# Patient Record
Sex: Female | Born: 1945
Health system: Southern US, Community
[De-identification: ages and names within clinical notes are randomized; demographics above are authoritative.]

## PROBLEM LIST (undated history)

## (undated) DIAGNOSIS — J189 Pneumonia, unspecified organism: Secondary | ICD-10-CM

## (undated) DIAGNOSIS — I447 Left bundle-branch block, unspecified: Secondary | ICD-10-CM

## (undated) DIAGNOSIS — I5032 Chronic diastolic (congestive) heart failure: Secondary | ICD-10-CM

## (undated) DIAGNOSIS — D649 Anemia, unspecified: Secondary | ICD-10-CM

## (undated) DIAGNOSIS — I428 Other cardiomyopathies: Secondary | ICD-10-CM

## (undated) DIAGNOSIS — K1230 Oral mucositis (ulcerative), unspecified: Secondary | ICD-10-CM

## (undated) DIAGNOSIS — Z923 Personal history of irradiation: Secondary | ICD-10-CM

## (undated) DIAGNOSIS — E039 Hypothyroidism, unspecified: Secondary | ICD-10-CM

## (undated) DIAGNOSIS — I7300 Raynaud's syndrome without gangrene: Secondary | ICD-10-CM

## (undated) DIAGNOSIS — I480 Paroxysmal atrial fibrillation: Secondary | ICD-10-CM

## (undated) DIAGNOSIS — C50919 Malignant neoplasm of unspecified site of unspecified female breast: Secondary | ICD-10-CM

## (undated) DIAGNOSIS — I10 Essential (primary) hypertension: Secondary | ICD-10-CM

## (undated) DIAGNOSIS — IMO0001 Reserved for inherently not codable concepts without codable children: Secondary | ICD-10-CM

## (undated) DIAGNOSIS — Z5189 Encounter for other specified aftercare: Secondary | ICD-10-CM

## (undated) DIAGNOSIS — M1990 Unspecified osteoarthritis, unspecified site: Secondary | ICD-10-CM

## (undated) DIAGNOSIS — I6529 Occlusion and stenosis of unspecified carotid artery: Secondary | ICD-10-CM

## (undated) DIAGNOSIS — I469 Cardiac arrest, cause unspecified: Secondary | ICD-10-CM

## (undated) DIAGNOSIS — S82002A Unspecified fracture of left patella, initial encounter for closed fracture: Secondary | ICD-10-CM

## (undated) HISTORY — DX: Malignant neoplasm of unspecified site of unspecified female breast: C50.919

## (undated) HISTORY — DX: Occlusion and stenosis of unspecified carotid artery: I65.29

## (undated) HISTORY — DX: Other cardiomyopathies: I42.8

## (undated) HISTORY — DX: Hypothyroidism, unspecified: E03.9

## (undated) HISTORY — PX: TISSUE EXPANDER PLACEMENT: SHX2530

## (undated) HISTORY — PX: TISSUE EXPANDER  REMOVAL W/ REPLACEMENT OF IMPLANT: SHX2529

## (undated) HISTORY — DX: Chronic diastolic (congestive) heart failure: I50.32

## (undated) HISTORY — DX: Left bundle-branch block, unspecified: I44.7

## (undated) HISTORY — DX: Unspecified fracture of left patella, initial encounter for closed fracture: S82.002A

## (undated) HISTORY — DX: Personal history of irradiation: Z92.3

## (undated) HISTORY — DX: Cardiac arrest, cause unspecified: I46.9

## (undated) HISTORY — DX: Essential (primary) hypertension: I10

## (undated) HISTORY — DX: Raynaud's syndrome without gangrene: I73.00

## (undated) HISTORY — PX: DILATION AND CURETTAGE OF UTERUS: SHX78

## (undated) SURGERY — Surgical Case
Anesthesia: *Unknown

---
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
## 1966-12-19 HISTORY — PX: OTHER SURGICAL HISTORY: SHX169

## 1969-12-19 HISTORY — PX: OTHER SURGICAL HISTORY: SHX169

## 1974-12-19 HISTORY — PX: OTHER SURGICAL HISTORY: SHX169

## 1986-12-19 DIAGNOSIS — Z923 Personal history of irradiation: Secondary | ICD-10-CM

## 1986-12-19 HISTORY — PX: OTHER SURGICAL HISTORY: SHX169

## 1986-12-19 HISTORY — DX: Personal history of irradiation: Z92.3

## 1988-12-19 HISTORY — PX: POLYPECTOMY: SHX149

## 1998-10-29 ENCOUNTER — Ambulatory Visit (HOSPITAL_BASED_OUTPATIENT_CLINIC_OR_DEPARTMENT_OTHER): Admission: RE | Admit: 1998-10-29 | Discharge: 1998-10-29 | Payer: Self-pay | Admitting: Orthopedic Surgery

## 1999-04-16 ENCOUNTER — Encounter: Payer: Self-pay | Admitting: Hematology and Oncology

## 1999-04-16 ENCOUNTER — Ambulatory Visit (HOSPITAL_COMMUNITY): Admission: RE | Admit: 1999-04-16 | Discharge: 1999-04-16 | Payer: Self-pay | Admitting: Hematology and Oncology

## 1999-05-11 ENCOUNTER — Other Ambulatory Visit: Admission: RE | Admit: 1999-05-11 | Discharge: 1999-05-11 | Payer: Self-pay | Admitting: Obstetrics and Gynecology

## 2000-05-16 ENCOUNTER — Other Ambulatory Visit: Admission: RE | Admit: 2000-05-16 | Discharge: 2000-05-16 | Payer: Self-pay | Admitting: Obstetrics and Gynecology

## 2001-08-10 ENCOUNTER — Other Ambulatory Visit: Admission: RE | Admit: 2001-08-10 | Discharge: 2001-08-10 | Payer: Self-pay | Admitting: Obstetrics and Gynecology

## 2001-10-01 ENCOUNTER — Encounter: Admission: RE | Admit: 2001-10-01 | Discharge: 2001-10-01 | Payer: Self-pay | Admitting: Family Medicine

## 2001-10-01 ENCOUNTER — Encounter: Payer: Self-pay | Admitting: Family Medicine

## 2001-11-28 ENCOUNTER — Other Ambulatory Visit: Admission: RE | Admit: 2001-11-28 | Discharge: 2001-11-28 | Payer: Self-pay | Admitting: Obstetrics and Gynecology

## 2002-03-05 ENCOUNTER — Ambulatory Visit (HOSPITAL_BASED_OUTPATIENT_CLINIC_OR_DEPARTMENT_OTHER): Admission: RE | Admit: 2002-03-05 | Discharge: 2002-03-05 | Payer: Self-pay | Admitting: Plastic Surgery

## 2002-03-05 ENCOUNTER — Encounter (INDEPENDENT_AMBULATORY_CARE_PROVIDER_SITE_OTHER): Payer: Self-pay | Admitting: Specialist

## 2002-04-12 ENCOUNTER — Encounter: Payer: Self-pay | Admitting: Family Medicine

## 2002-04-12 ENCOUNTER — Encounter: Admission: RE | Admit: 2002-04-12 | Discharge: 2002-04-12 | Payer: Self-pay | Admitting: Family Medicine

## 2002-10-25 ENCOUNTER — Other Ambulatory Visit: Admission: RE | Admit: 2002-10-25 | Discharge: 2002-10-25 | Payer: Self-pay | Admitting: Obstetrics and Gynecology

## 2003-12-08 ENCOUNTER — Inpatient Hospital Stay (HOSPITAL_COMMUNITY): Admission: EM | Admit: 2003-12-08 | Discharge: 2003-12-11 | Payer: Self-pay | Admitting: Emergency Medicine

## 2004-01-09 ENCOUNTER — Encounter: Admission: RE | Admit: 2004-01-09 | Discharge: 2004-01-09 | Payer: Self-pay | Admitting: Family Medicine

## 2004-01-16 ENCOUNTER — Other Ambulatory Visit: Admission: RE | Admit: 2004-01-16 | Discharge: 2004-01-16 | Payer: Self-pay | Admitting: Obstetrics and Gynecology

## 2004-03-05 ENCOUNTER — Inpatient Hospital Stay (HOSPITAL_BASED_OUTPATIENT_CLINIC_OR_DEPARTMENT_OTHER): Admission: RE | Admit: 2004-03-05 | Discharge: 2004-03-05 | Payer: Self-pay | Admitting: *Deleted

## 2004-03-31 ENCOUNTER — Ambulatory Visit (HOSPITAL_COMMUNITY): Admission: RE | Admit: 2004-03-31 | Discharge: 2004-03-31 | Payer: Self-pay | Admitting: Family Medicine

## 2005-02-18 ENCOUNTER — Other Ambulatory Visit: Admission: RE | Admit: 2005-02-18 | Discharge: 2005-02-18 | Payer: Self-pay | Admitting: Obstetrics and Gynecology

## 2005-12-05 ENCOUNTER — Ambulatory Visit: Payer: Self-pay | Admitting: Cardiovascular Disease

## 2006-01-05 ENCOUNTER — Ambulatory Visit (HOSPITAL_COMMUNITY): Admission: RE | Admit: 2006-01-05 | Discharge: 2006-01-05 | Payer: Self-pay | Admitting: Cardiovascular Disease

## 2006-01-05 ENCOUNTER — Ambulatory Visit: Payer: Self-pay | Admitting: Cardiovascular Disease

## 2006-04-04 ENCOUNTER — Other Ambulatory Visit: Admission: RE | Admit: 2006-04-04 | Discharge: 2006-04-04 | Payer: Self-pay | Admitting: Obstetrics and Gynecology

## 2007-03-22 ENCOUNTER — Ambulatory Visit: Payer: Self-pay | Admitting: Cardiovascular Disease

## 2007-03-22 LAB — CONVERTED CEMR LAB
Free T4: 1.1 ng/dL (ref 0.6–1.6)
TSH: 0.87 microintl units/mL (ref 0.35–5.50)

## 2007-03-26 ENCOUNTER — Encounter: Payer: Self-pay | Admitting: Cardiovascular Disease

## 2007-03-26 ENCOUNTER — Ambulatory Visit: Payer: Self-pay

## 2007-04-04 ENCOUNTER — Ambulatory Visit: Payer: Self-pay | Admitting: Cardiovascular Disease

## 2007-04-12 ENCOUNTER — Ambulatory Visit: Payer: Self-pay | Admitting: Cardiovascular Disease

## 2007-04-19 ENCOUNTER — Ambulatory Visit: Payer: Self-pay | Admitting: Cardiovascular Disease

## 2007-04-23 ENCOUNTER — Ambulatory Visit: Payer: Self-pay | Admitting: Internal Medicine

## 2007-05-10 ENCOUNTER — Ambulatory Visit: Payer: Self-pay | Admitting: Internal Medicine

## 2007-06-04 ENCOUNTER — Ambulatory Visit: Payer: Self-pay | Admitting: Internal Medicine

## 2007-07-02 ENCOUNTER — Ambulatory Visit: Payer: Self-pay | Admitting: Internal Medicine

## 2007-11-02 ENCOUNTER — Ambulatory Visit: Payer: Self-pay | Admitting: Internal Medicine

## 2007-12-20 DIAGNOSIS — C50919 Malignant neoplasm of unspecified site of unspecified female breast: Secondary | ICD-10-CM

## 2007-12-20 HISTORY — PX: BREAST LUMPECTOMY: SHX2

## 2007-12-20 HISTORY — DX: Malignant neoplasm of unspecified site of unspecified female breast: C50.919

## 2007-12-20 HISTORY — PX: MASTECTOMY: SHX3

## 2007-12-24 ENCOUNTER — Ambulatory Visit: Payer: Self-pay | Admitting: Internal Medicine

## 2008-01-07 ENCOUNTER — Ambulatory Visit: Payer: Self-pay | Admitting: Internal Medicine

## 2008-01-07 LAB — CONVERTED CEMR LAB
BUN: 15 mg/dL (ref 6–23)
Calcium: 9.4 mg/dL (ref 8.4–10.5)
Chloride: 101 meq/L (ref 96–112)
GFR calc non Af Amer: 90 mL/min
Glucose, Bld: 79 mg/dL (ref 70–99)

## 2008-01-22 ENCOUNTER — Encounter: Admission: RE | Admit: 2008-01-22 | Discharge: 2008-01-22 | Payer: Self-pay | Admitting: Obstetrics and Gynecology

## 2008-01-22 ENCOUNTER — Encounter (INDEPENDENT_AMBULATORY_CARE_PROVIDER_SITE_OTHER): Payer: Self-pay | Admitting: Diagnostic Radiology

## 2008-01-30 ENCOUNTER — Ambulatory Visit: Admission: RE | Admit: 2008-01-30 | Discharge: 2008-04-29 | Payer: Self-pay | Admitting: Radiation Oncology

## 2008-01-31 ENCOUNTER — Encounter: Admission: RE | Admit: 2008-01-31 | Discharge: 2008-01-31 | Payer: Self-pay | Admitting: Obstetrics and Gynecology

## 2008-02-04 ENCOUNTER — Ambulatory Visit: Payer: Self-pay | Admitting: Internal Medicine

## 2008-03-05 ENCOUNTER — Encounter: Admission: RE | Admit: 2008-03-05 | Discharge: 2008-03-05 | Payer: Self-pay | Admitting: General Surgery

## 2008-03-05 ENCOUNTER — Encounter (INDEPENDENT_AMBULATORY_CARE_PROVIDER_SITE_OTHER): Payer: Self-pay | Admitting: General Surgery

## 2008-03-05 ENCOUNTER — Ambulatory Visit (HOSPITAL_COMMUNITY): Admission: RE | Admit: 2008-03-05 | Discharge: 2008-03-06 | Payer: Self-pay | Admitting: General Surgery

## 2008-03-10 ENCOUNTER — Ambulatory Visit: Payer: Self-pay | Admitting: Oncology

## 2008-03-19 LAB — CBC WITH DIFFERENTIAL/PLATELET
BASO%: 0.3 % (ref 0.0–2.0)
Basophils Absolute: 0 10*3/uL (ref 0.0–0.1)
EOS%: 3.5 % (ref 0.0–7.0)
HGB: 12.7 g/dL (ref 11.6–15.9)
MCH: 31.6 pg (ref 26.0–34.0)
RBC: 4.01 10*6/uL (ref 3.70–5.32)
RDW: 13.9 % (ref 11.3–14.5)
lymph#: 1.5 10*3/uL (ref 0.9–3.3)

## 2008-03-19 LAB — COMPREHENSIVE METABOLIC PANEL
ALT: 20 U/L (ref 0–35)
AST: 20 U/L (ref 0–37)
Albumin: 4.2 g/dL (ref 3.5–5.2)
Alkaline Phosphatase: 90 U/L (ref 39–117)
BUN: 22 mg/dL (ref 6–23)
Calcium: 9.6 mg/dL (ref 8.4–10.5)
Chloride: 102 mEq/L (ref 96–112)
Potassium: 4.7 mEq/L (ref 3.5–5.3)
Sodium: 140 mEq/L (ref 135–145)
Total Protein: 6.2 g/dL (ref 6.0–8.3)

## 2008-03-28 ENCOUNTER — Ambulatory Visit (HOSPITAL_COMMUNITY): Admission: RE | Admit: 2008-03-28 | Discharge: 2008-03-28 | Payer: Self-pay | Admitting: Oncology

## 2008-04-18 ENCOUNTER — Ambulatory Visit: Payer: Self-pay | Admitting: Cardiovascular Disease

## 2008-04-22 ENCOUNTER — Ambulatory Visit: Payer: Self-pay

## 2008-04-22 ENCOUNTER — Encounter: Payer: Self-pay | Admitting: Cardiovascular Disease

## 2008-04-29 ENCOUNTER — Inpatient Hospital Stay (HOSPITAL_COMMUNITY): Admission: RE | Admit: 2008-04-29 | Discharge: 2008-04-30 | Payer: Self-pay | Admitting: General Surgery

## 2008-04-29 ENCOUNTER — Encounter (INDEPENDENT_AMBULATORY_CARE_PROVIDER_SITE_OTHER): Payer: Self-pay | Admitting: General Surgery

## 2008-08-04 ENCOUNTER — Ambulatory Visit: Payer: Self-pay | Admitting: Cardiovascular Disease

## 2008-08-15 ENCOUNTER — Ambulatory Visit: Payer: Self-pay | Admitting: Oncology

## 2008-08-18 LAB — CBC WITH DIFFERENTIAL/PLATELET
BASO%: 2.2 % — ABNORMAL HIGH (ref 0.0–2.0)
EOS%: 4.4 % (ref 0.0–7.0)
LYMPH%: 20.6 % (ref 14.0–48.0)
MCH: 31.3 pg (ref 26.0–34.0)
MCHC: 33.5 g/dL (ref 32.0–36.0)
MCV: 93.6 fL (ref 81.0–101.0)
MONO%: 7.4 % (ref 0.0–13.0)
Platelets: 305 10*3/uL (ref 145–400)
RBC: 4.11 10*6/uL (ref 3.70–5.32)

## 2008-08-19 LAB — COMPREHENSIVE METABOLIC PANEL
ALT: 13 U/L (ref 0–35)
AST: 17 U/L (ref 0–37)
Alkaline Phosphatase: 100 U/L (ref 39–117)
Glucose, Bld: 103 mg/dL — ABNORMAL HIGH (ref 70–99)
Sodium: 142 mEq/L (ref 135–145)
Total Bilirubin: 0.3 mg/dL (ref 0.3–1.2)
Total Protein: 6.6 g/dL (ref 6.0–8.3)

## 2008-08-19 LAB — LACTATE DEHYDROGENASE: LDH: 152 U/L (ref 94–250)

## 2009-01-20 ENCOUNTER — Ambulatory Visit (HOSPITAL_COMMUNITY): Admission: RE | Admit: 2009-01-20 | Discharge: 2009-01-20 | Payer: Self-pay | Admitting: Plastic Surgery

## 2009-02-06 ENCOUNTER — Ambulatory Visit: Payer: Self-pay | Admitting: Cardiovascular Disease

## 2009-02-11 ENCOUNTER — Ambulatory Visit: Payer: Self-pay

## 2009-02-13 ENCOUNTER — Ambulatory Visit: Payer: Self-pay | Admitting: Oncology

## 2009-02-17 ENCOUNTER — Ambulatory Visit: Payer: Self-pay | Admitting: Cardiovascular Disease

## 2009-02-19 ENCOUNTER — Ambulatory Visit (HOSPITAL_COMMUNITY): Admission: RE | Admit: 2009-02-19 | Discharge: 2009-02-20 | Payer: Self-pay | Admitting: Plastic Surgery

## 2009-05-02 DIAGNOSIS — I447 Left bundle-branch block, unspecified: Secondary | ICD-10-CM | POA: Insufficient documentation

## 2009-05-02 DIAGNOSIS — E039 Hypothyroidism, unspecified: Secondary | ICD-10-CM | POA: Insufficient documentation

## 2009-05-02 DIAGNOSIS — I10 Essential (primary) hypertension: Secondary | ICD-10-CM

## 2009-05-02 DIAGNOSIS — I429 Cardiomyopathy, unspecified: Secondary | ICD-10-CM | POA: Insufficient documentation

## 2009-05-02 DIAGNOSIS — D0590 Unspecified type of carcinoma in situ of unspecified breast: Secondary | ICD-10-CM | POA: Insufficient documentation

## 2009-05-26 ENCOUNTER — Telehealth: Payer: Self-pay | Admitting: Cardiovascular Disease

## 2009-09-07 ENCOUNTER — Ambulatory Visit: Payer: Self-pay | Admitting: Cardiovascular Disease

## 2010-01-07 ENCOUNTER — Ambulatory Visit (HOSPITAL_COMMUNITY): Admission: RE | Admit: 2010-01-07 | Discharge: 2010-01-07 | Payer: Self-pay | Admitting: Oncology

## 2010-02-11 ENCOUNTER — Ambulatory Visit: Payer: Self-pay | Admitting: Oncology

## 2010-02-16 LAB — CANCER ANTIGEN 27.29: CA 27.29: 25 U/mL (ref 0–39)

## 2010-02-16 LAB — CBC WITH DIFFERENTIAL/PLATELET
Basophils Absolute: 0.2 10*3/uL — ABNORMAL HIGH (ref 0.0–0.1)
Eosinophils Absolute: 0.4 10*3/uL (ref 0.0–0.5)
HCT: 38.5 % (ref 34.8–46.6)
HGB: 12.8 g/dL (ref 11.6–15.9)
MONO#: 0.9 10*3/uL (ref 0.1–0.9)
NEUT#: 4.5 10*3/uL (ref 1.5–6.5)
NEUT%: 54 % (ref 38.4–76.8)
WBC: 8.3 10*3/uL (ref 3.9–10.3)
lymph#: 2.4 10*3/uL (ref 0.9–3.3)

## 2010-02-16 LAB — VITAMIN D 25 HYDROXY (VIT D DEFICIENCY, FRACTURES): Vit D, 25-Hydroxy: 55 ng/mL (ref 30–89)

## 2010-02-16 LAB — COMPREHENSIVE METABOLIC PANEL
Albumin: 4 g/dL (ref 3.5–5.2)
BUN: 21 mg/dL (ref 6–23)
Calcium: 9.2 mg/dL (ref 8.4–10.5)
Chloride: 99 mEq/L (ref 96–112)
Glucose, Bld: 119 mg/dL — ABNORMAL HIGH (ref 70–99)
Potassium: 3.9 mEq/L (ref 3.5–5.3)
Sodium: 138 mEq/L (ref 135–145)
Total Protein: 6.7 g/dL (ref 6.0–8.3)

## 2010-02-23 ENCOUNTER — Encounter: Payer: Self-pay | Admitting: Cardiovascular Disease

## 2010-09-21 ENCOUNTER — Telehealth: Payer: Self-pay | Admitting: Cardiovascular Disease

## 2010-10-26 ENCOUNTER — Ambulatory Visit: Payer: Self-pay | Admitting: Cardiovascular Disease

## 2010-10-26 ENCOUNTER — Encounter: Payer: Self-pay | Admitting: Cardiovascular Disease

## 2010-11-23 ENCOUNTER — Ambulatory Visit: Payer: Self-pay | Admitting: Cardiovascular Disease

## 2011-01-09 ENCOUNTER — Encounter: Payer: Self-pay | Admitting: Cardiovascular Disease

## 2011-01-11 ENCOUNTER — Other Ambulatory Visit: Payer: Self-pay | Admitting: Obstetrics and Gynecology

## 2011-01-18 NOTE — Letter (Signed)
[facility]   Imported By: Roderic Ovens 04/13/2010 10:52:07  _____________________________________________________________________  External Attachment:    Type:   Image     Comment:   External Document

## 2011-01-18 NOTE — Assessment & Plan Note (Signed)
Summary: PER CHECK OUT/SF   CC:  check up.  History of Present Illness: [name] is seen today for F/U of non-ischemic DCM EF around 50%, abnormal ECG with ICLBBB and HTN. When I last saw her I cleared her for Breast Reconstructive Surgery which went well without cardiac complications.  She had a normal myovue 02/11/2009 with EF improved to 74%  By echo on 04/22/2008 wther was mild LVE with EF 50-55%.  She's had a previous cath I believe in 2005 with no CAD.  She is not having any SOB, palpitations, PND, orthopnea or edema.  She is walking 2 miles/day.  She is in excellent spirits.  Her husband  finally got his kidney transplant with some minor complications.  She hates the drive to [place].  We stopped her Nadolol last time and continued just coreg and she is doing fine with this  Current Problems (verified): 1)  Atrial Fibrillation  (ICD-427.31) 2)  Lbbb  (ICD-426.3) 3)  Cardiomyopathy, Secondary  (ICD-425.9) 4)  Hypertension, Unspecified  (ICD-401.9) 5)  Hypothyroidism  (ICD-244.9) 6)  Carcinoma in Situ of Breast  (ICD-233.0)  Current Medications (verified): 1)  Carvedilol 12.5 Mg Tabs (Carvedilol) .Marland Kitchen.. 1 1/2 Tab By Mouth Two Times A Day 2)  Cozaar 50 Mg Tabs (Losartan Potassium) .... Take One Tablet By Mouth Daily 3)  Levothyroxine Sodium 112 Mcg Tabs (Levothyroxine Sodium) .Marland Kitchen.. 1 Tab By Mouth Once Daily 4)  Mag-Ox 400 400 Mg Tabs (Magnesium Oxide) .... Take 1 Tablet By Mouth Two Times A Day 5)  Calcium-Magnesium 300-300 Mg Tabs (Calcium-Magnesium) .... 2 Tabs By Mouth Two Times A Day 6)  Osteo Complex  Caps (Multiple Vitamins-Minerals) .Marland Kitchen.. 1 Tab By Mouth Two Times A Day 7)  Multivitamins   Tabs (Multiple Vitamin) .... 2  A Day 8)  Vegetarian Omega Green .Marland Kitchen.. 3 Tabs Once Daily 9)  Ciaga Juice .Marland Kitchen.. 1 Oz Once Daily. As Needed 10)  Vitamin D3 5000 Unit Caps (Cholecalciferol) .... Take 1 Capsule By Mouth Once A Day  Allergies (verified): 1)  Epinephrine (Epinephrine) 2)  Lisinopril  (Lisinopril)  Past History:  Past Medical History: Last updated: 05/02/2009 ATRIAL FIBRILLATION (ICD-427.31) LBBB (ICD-426.3) CARDIOMYOPATHY, SECONDARY (ICD-425.9) HYPERTENSION, UNSPECIFIED (ICD-401.9) HYPOTHYROIDISM (ICD-244.9) CARCINOMA IN SITU OF BREAST (ICD-233.0)    Past Surgical History: Last updated: 05/02/2009 T&A - 1965 Thoractomy -  1968 BTL - 1971 R vein ligation & stripping - 1976 Spleenectomy for Hodgkin's disease - 1988 D & C 1791 & 1977 D&C-polypectomy - 1990 R breast lumpectomy, lymph node biopsy - 2009 Mastectomy - 2009  Family History: Last updated: 09/07/2009 non-contributory  Social History: Last updated: 05/02/2009 Retired  Married  Tobacco Use - No.  Alcohol Use - no Regular Exercise - yes Drug Use - no  Review of Systems       Denies fever, malais, weight loss, blurry vision, decreased visual acuity, cough, sputum, SOB, hemoptysis, pleuritic pain, palpitaitons, heartburn, abdominal pain, melena, lower extremity edema, claudication, or rash.   Vital Signs:  Patient profile:   65 year old female Height:      62 inches Weight:      139 pounds BMI:     25.52 Pulse rate:   87 / minute Resp:     16 per minute BP sitting:   93 / 55  (left arm)  Vitals Entered By: Kem Parkinson (November 23, 2010 2:02 PM)  Physical Exam  General:  Affect appropriate Healthy:  appears stated age HEENT: normal Neck supple with  no adenopathy JVP normal no bruits no thyromegaly Lungs clear with no wheezing and good diaphragmatic motion Heart:  S1/S2 no murmur,rub, gallop or click PMI normal Abdomen: benighn, BS positve, no tenderness, no AAA no bruit.  No HSM or HJR Distal pulses intact with no bruits No edema Neuro non-focal Skin warm and dry    Impression & Recommendations:  Problem # 1:  CARDIOMYOPATHY, SECONDARY (ICD-425.9) Stable improved EF on medical Rx and functional class one Her updated medication list for this problem includes:     Carvedilol 12.5 Mg Tabs (Carvedilol) .Marland Kitchen... 1 1/2 tab by mouth two times a day    Cozaar 50 Mg Tabs (Losartan potassium) .Marland Kitchen... Take one tablet by mouth daily  Problem # 2:  HYPERTENSION, UNSPECIFIED (ICD-401.9) Well controlled Her updated medication list for this problem includes:    Carvedilol 12.5 Mg Tabs (Carvedilol) .Marland Kitchen... 1 1/2 tab by mouth two times a day    Cozaar 50 Mg Tabs (Losartan potassium) .Marland Kitchen... Take one tablet by mouth daily  Problem # 3:  CARCINOMA IN SITU OF BREAST (ICD-233.0) S/P reconstructive surgery with no evidence of recurrence  F/U oncology  Problem # 4:  LBBB (ICD-426.3) No evidence of high grade block or syncope.  Probably reflective of DCM Her updated medication list for this problem includes:    Carvedilol 12.5 Mg Tabs (Carvedilol) .Marland Kitchen... 1 1/2 tab by mouth two times a day  Patient Instructions: 1)  Your physician wants you to follow-up in: 6 MONTHS  You will receive a reminder letter in the mail two months in advance. If you don't receive a letter, please call our office to schedule the follow-up appointment.

## 2011-01-18 NOTE — Progress Notes (Signed)
Summary: refill  Phone Note Refill Request Message from:  Patient on September 21, 2010 10:18 AM  Refills Requested: Medication #1:  CARVEDILOL 12.5 MG TABS 1 1/2 tab by mouth two times a day [facility] to [facility]  [phone]  Initial call taken by: Judie Grieve,  September 21, 2010 10:18 AM    Prescriptions: CARVEDILOL 12.5 MG TABS (CARVEDILOL) 1 1/2 tab by mouth two times a day  #90 Tablet x 3   Entered by:   Kem Parkinson   Authorized by:   Colon Branch, MD, [facility]   Signed by:   Kem Parkinson on 09/21/2010   Method used:   Electronically to        [facility] # [phone]* (retail)       [address] [place], [place]  [address]       Ph: [phone]       Fax: [phone]   RxID:   [id number]

## 2011-01-18 NOTE — Assessment & Plan Note (Signed)
Summary: f1y   Visit Type:  1 year follow up  CC:  No cardiac complaints.  History of Present Illness: [name] is seen today for F/U of non-ischemic DCM EF around 50%, abnormal ECG with ICLBBB and HTN. When I last saw her I cleared her for Breast Reconstructive Surgery which went well without cardiac complications.  She had a normal myovue 02/11/2009 with EF improved to 74%  By echo on 04/22/2008 wther was mild LVE with EF 50-55%.  She's had a previous cath I believe in 2005 with no CAD.  She is not having any SOB, palpitations, PND, orthopnea or edema.  She is walking 2 miles/day.  She is in excellent spirits.  Her husband  finally got his kidney transplant with some minor complications.  She hates the drive to [place].  We discussed the issue of her being on both Nadolol and coreg.  Apparantly this was started by Dr Hedwig Morton.  I dont think she needs to be on both and will stop the Nadolol  Problems Prior to Update: 1)  Atrial Fibrillation  (ICD-427.31) 2)  Lbbb  (ICD-426.3) 3)  Cardiomyopathy, Secondary  (ICD-425.9) 4)  Hypertension, Unspecified  (ICD-401.9) 5)  Hypothyroidism  (ICD-244.9) 6)  Carcinoma in Situ of Breast  (ICD-233.0)  Current Problems (verified): 1)  Atrial Fibrillation  (ICD-427.31) 2)  Lbbb  (ICD-426.3) 3)  Cardiomyopathy, Secondary  (ICD-425.9) 4)  Hypertension, Unspecified  (ICD-401.9) 5)  Hypothyroidism  (ICD-244.9) 6)  Carcinoma in Situ of Breast  (ICD-233.0)  Current Medications (verified): 1)  Carvedilol 12.5 Mg Tabs (Carvedilol) .Marland Kitchen.. 1 1/2 Tab By Mouth Two Times A Day 2)  Nadolol 40 Mg Tabs (Nadolol) .... Take 1 Tablet By Mouth Once A Day 3)  Cozaar 50 Mg Tabs (Losartan Potassium) .... Take One Tablet By Mouth Daily 4)  Levothyroxine Sodium 112 Mcg Tabs (Levothyroxine Sodium) .Marland Kitchen.. 1 Tab By Mouth Once Daily 5)  Mag-Ox 400 400 Mg Tabs (Magnesium Oxide) .... Take 1 Tablet By Mouth Two Times A Day 6)  Calcium-Magnesium 300-300 Mg Tabs (Calcium-Magnesium) .... 2  Tabs By Mouth Two Times A Day 7)  Osteo Complex  Caps (Multiple Vitamins-Minerals) .Marland Kitchen.. 1 Tab By Mouth Two Times A Day 8)  Multivitamins   Tabs (Multiple Vitamin) .... 3 A Day 9)  Vegetarian Omega Green .Marland Kitchen.. 3 Tabs Once Daily 10)  Ciaga Juice .Marland Kitchen.. 1 Oz Once Daily. As Needed 11)  Vitamin D3 5000 Unit Caps (Cholecalciferol) .... Take 1 Capsule By Mouth Once A Day  Allergies: 1)  Epinephrine (Epinephrine) 2)  Lisinopril (Lisinopril)  Past History:  Past Medical History: Last updated: 05/02/2009 ATRIAL FIBRILLATION (ICD-427.31) LBBB (ICD-426.3) CARDIOMYOPATHY, SECONDARY (ICD-425.9) HYPERTENSION, UNSPECIFIED (ICD-401.9) HYPOTHYROIDISM (ICD-244.9) CARCINOMA IN SITU OF BREAST (ICD-233.0)    Past Surgical History: Last updated: 05/02/2009 T&A - 1965 Thoractomy -  1968 BTL - 1971 R vein ligation & stripping - 1976 Spleenectomy for Hodgkin's disease - 1988 D & C 1791 & 1977 D&C-polypectomy - 1990 R breast lumpectomy, lymph node biopsy - 2009 Mastectomy - 2009  Family History: Last updated: 09/07/2009 non-contributory  Social History: Last updated: 05/02/2009 Retired  Married  Tobacco Use - No.  Alcohol Use - no Regular Exercise - yes Drug Use - no  Review of Systems       Denies fever, malais, weight loss, blurry vision, decreased visual acuity, cough, sputum, SOB, hemoptysis, pleuritic pain, palpitaitons, heartburn, abdominal pain, melena, lower extremity edema, claudication, or rash.   Vital Signs:  Patient profile:  65 year old female Height:      62 inches Weight:      137.50 pounds BMI:     25.24 Pulse rate:   70 / minute Pulse rhythm:   regular Resp:     18 per minute BP sitting:   105 / 62  (left arm) Cuff size:   regular  Vitals Entered By: Vikki Ports (October 26, 2010 9:07 AM)  Physical Exam  General:  Affect appropriate Healthy:  appears stated age HEENT: normal Neck supple with no adenopathy JVP normal no bruits no thyromegaly Lungs  clear with no wheezing and good diaphragmatic motion Heart:  S1/S2 no murmur,rub, gallop or click PMI normal Abdomen: benighn, BS positve, no tenderness, no AAA no bruit.  No HSM or HJR Distal pulses intact with no bruits No edema Neuro non-focal Skin warm and dry    Impression & Recommendations:  Problem # 1:  LBBB (ICD-426.3) Stable no high grade heart block The following medications were removed from the medication list:    Nadolol 40 Mg Tabs (Nadolol) .Marland Kitchen... Take 1 tablet by mouth once a day Her updated medication list for this problem includes:    Carvedilol 12.5 Mg Tabs (Carvedilol) .Marland Kitchen... 1 1/2 tab by mouth two times a day  Problem # 2:  CARDIOMYOPATHY, SECONDARY (ICD-425.9) Stable functional class one.  Will reassess EF in a year with echo or MRI The following medications were removed from the medication list:    Nadolol 40 Mg Tabs (Nadolol) .Marland Kitchen... Take 1 tablet by mouth once a day Her updated medication list for this problem includes:    Carvedilol 12.5 Mg Tabs (Carvedilol) .Marland Kitchen... 1 1/2 tab by mouth two times a day    Cozaar 50 Mg Tabs (Losartan potassium) .Marland Kitchen... Take one tablet by mouth daily  Problem # 3:  HYPERTENSION, UNSPECIFIED (ICD-401.9) Well controlled.  Stop nadolol and adjust coreg as needed The following medications were removed from the medication list:    Nadolol 40 Mg Tabs (Nadolol) .Marland Kitchen... Take 1 tablet by mouth once a day Her updated medication list for this problem includes:    Carvedilol 12.5 Mg Tabs (Carvedilol) .Marland Kitchen... 1 1/2 tab by mouth two times a day    Cozaar 50 Mg Tabs (Losartan potassium) .Marland Kitchen... Take one tablet by mouth daily  Other Orders: EKG w/ Interpretation (93000)  Patient Instructions: 1)  Your physician recommends that you schedule a follow-up appointment in: 4 WEEKS WITH DR Eden Emms 2)  Your physician has recommended you make the following change in your medication: STOP NADOLOL   EKG Report  Procedure date:  10/26/2010  Findings:       NSR 68 LBBB

## 2011-01-20 NOTE — Letter (Signed)
Summary: [name] Physicians Office Visit Note   [facility] Physicians Office Visit Note   Imported By: Roderic Ovens 12/01/2010 11:22:11  _____________________________________________________________________  External Attachment:    Type:   Image     Comment:   External Document

## 2011-02-11 ENCOUNTER — Emergency Department (HOSPITAL_COMMUNITY): Payer: Medicare Other

## 2011-02-11 ENCOUNTER — Emergency Department (HOSPITAL_COMMUNITY)
Admission: EM | Admit: 2011-02-11 | Discharge: 2011-02-11 | Disposition: A | Discharge status: Home / Self Care | Payer: Medicare Other | Attending: Emergency Medicine | Admitting: Emergency Medicine

## 2011-02-11 DIAGNOSIS — R079 Chest pain, unspecified: Secondary | ICD-10-CM | POA: Insufficient documentation

## 2011-02-11 DIAGNOSIS — Z853 Personal history of malignant neoplasm of breast: Secondary | ICD-10-CM | POA: Insufficient documentation

## 2011-02-11 DIAGNOSIS — I2510 Atherosclerotic heart disease of native coronary artery without angina pectoris: Secondary | ICD-10-CM | POA: Insufficient documentation

## 2011-02-11 DIAGNOSIS — I10 Essential (primary) hypertension: Secondary | ICD-10-CM | POA: Insufficient documentation

## 2011-02-11 DIAGNOSIS — Z87898 Personal history of other specified conditions: Secondary | ICD-10-CM | POA: Insufficient documentation

## 2011-02-11 DIAGNOSIS — Z79899 Other long term (current) drug therapy: Secondary | ICD-10-CM | POA: Insufficient documentation

## 2011-02-11 LAB — DIFFERENTIAL
Eosinophils Absolute: 0.4 10*3/uL (ref 0.0–0.7)
Lymphocytes Relative: 24 % (ref 12–46)
Lymphs Abs: 2.2 10*3/uL (ref 0.7–4.0)
Monocytes Relative: 12 % (ref 3–12)
Neutrophils Relative %: 58 % (ref 43–77)

## 2011-02-11 LAB — POCT I-STAT, CHEM 8
BUN: 24 mg/dL — ABNORMAL HIGH (ref 6–23)
Calcium, Ion: 1.16 mmol/L (ref 1.12–1.32)
Glucose, Bld: 111 mg/dL — ABNORMAL HIGH (ref 70–99)
HCT: 37 % (ref 36.0–46.0)
TCO2: 29 mmol/L (ref 0–100)

## 2011-02-11 LAB — CBC
HCT: 34.6 % — ABNORMAL LOW (ref 36.0–46.0)
MCH: 30.1 pg (ref 26.0–34.0)
MCV: 89.6 fL (ref 78.0–100.0)
Platelets: 373 10*3/uL (ref 150–400)
RBC: 3.86 MIL/uL — ABNORMAL LOW (ref 3.87–5.11)
WBC: 8.9 10*3/uL (ref 4.0–10.5)

## 2011-02-11 LAB — POCT CARDIAC MARKERS
Myoglobin, poc: 56.8 ng/mL (ref 12–200)
Troponin i, poc: <0.05 ng/mL (ref 0.00–0.09)

## 2011-02-14 ENCOUNTER — Telehealth: Payer: Self-pay | Admitting: Cardiovascular Disease

## 2011-02-18 ENCOUNTER — Other Ambulatory Visit: Payer: Self-pay | Admitting: Oncology

## 2011-02-18 ENCOUNTER — Encounter (HOSPITAL_BASED_OUTPATIENT_CLINIC_OR_DEPARTMENT_OTHER): Payer: Medicare Other | Admitting: Oncology

## 2011-02-18 DIAGNOSIS — C50519 Malignant neoplasm of lower-outer quadrant of unspecified female breast: Secondary | ICD-10-CM

## 2011-02-18 DIAGNOSIS — Z171 Estrogen receptor negative status [ER-]: Secondary | ICD-10-CM

## 2011-02-18 LAB — COMPREHENSIVE METABOLIC PANEL
Alkaline Phosphatase: 114 U/L (ref 39–117)
BUN: 16 mg/dL (ref 6–23)
CO2: 32 mEq/L (ref 19–32)
Creatinine, Ser: 0.8 mg/dL (ref 0.40–1.20)
Glucose, Bld: 85 mg/dL (ref 70–99)
Sodium: 145 mEq/L (ref 135–145)
Total Bilirubin: 0.6 mg/dL (ref 0.3–1.2)
Total Protein: 6.7 g/dL (ref 6.0–8.3)

## 2011-02-18 LAB — CBC WITH DIFFERENTIAL/PLATELET
Eosinophils Absolute: 0.4 10*3/uL (ref 0.0–0.5)
HCT: 38 % (ref 34.8–46.6)
LYMPH%: 22.4 % (ref 14.0–49.7)
MCV: 91.3 fL (ref 79.5–101.0)
MONO#: 1.1 10*3/uL — ABNORMAL HIGH (ref 0.1–0.9)
MONO%: 12.9 % (ref 0.0–14.0)
NEUT#: 4.7 10*3/uL (ref 1.5–6.5)
NEUT%: 57.9 % (ref 38.4–76.8)
Platelets: 410 10*3/uL — ABNORMAL HIGH (ref 145–400)
RBC: 4.16 10*6/uL (ref 3.70–5.45)
WBC: 8.2 10*3/uL (ref 3.9–10.3)

## 2011-02-18 LAB — LACTATE DEHYDROGENASE: LDH: 136 U/L (ref 94–250)

## 2011-02-19 LAB — CANCER ANTIGEN 27.29: CA 27.29: 33 U/mL (ref 0–39)

## 2011-02-19 LAB — VITAMIN D 25 HYDROXY (VIT D DEFICIENCY, FRACTURES): Vit D, 25-Hydroxy: 84 ng/mL (ref 30–89)

## 2011-02-24 NOTE — Progress Notes (Signed)
Summary: want to inform Dr.Nishan she in Ed last Friday  Phone Note Call from Patient Call back at Home Phone [phone]   Caller: Mom Summary of Call: Pt callind to inform Dr.Nishan know she was in the ED Friday evening ,wish for a call back was cleared and sent home Initial call taken by: Judie Grieve,  February 14, 2011 2:36 PM  Follow-up for Phone Call        Left message to call back Deliah Goody, RN  February 14, 2011 5:42 PM  spoke with pt, she had a several episodes of a sharpe, sudden pain to the left of her sternum. it would last only a few seconds but would cont to reoccur. she became scared and called ems. all of her workup was normal in the er. she has had no further episodes, was told by the er to report to [place]. pt will call with any problems.  Deliah Goody, RN  February 15, 2011 11:36 AM

## 2011-02-25 ENCOUNTER — Encounter (HOSPITAL_BASED_OUTPATIENT_CLINIC_OR_DEPARTMENT_OTHER): Payer: Medicare Other | Admitting: Oncology

## 2011-02-25 DIAGNOSIS — Z171 Estrogen receptor negative status [ER-]: Secondary | ICD-10-CM

## 2011-02-25 DIAGNOSIS — C50519 Malignant neoplasm of lower-outer quadrant of unspecified female breast: Secondary | ICD-10-CM

## 2011-03-07 LAB — CREATININE, SERUM
Creatinine, Ser: 0.89 mg/dL (ref 0.4–1.2)
GFR calc Af Amer: >60 mL/min (ref >60)
GFR calc non Af Amer: >60 mL/min (ref >60)

## 2011-03-31 LAB — BASIC METABOLIC PANEL
BUN: 19 mg/dL (ref 6–23)
Creatinine, Ser: 0.69 mg/dL (ref 0.4–1.2)
GFR calc non Af Amer: >60 mL/min (ref >60)
Glucose, Bld: 98 mg/dL (ref 70–99)
Potassium: 5.1 mEq/L (ref 3.5–5.1)

## 2011-03-31 LAB — CBC
HCT: 39.3 % (ref 36.0–46.0)
Platelets: 306 10*3/uL (ref 150–400)
RDW: 13.5 % (ref 11.5–15.5)
WBC: 7.9 10*3/uL (ref 4.0–10.5)

## 2011-04-05 LAB — BASIC METABOLIC PANEL
BUN: 17 mg/dL (ref 6–23)
Chloride: 103 mEq/L (ref 96–112)
Creatinine, Ser: 0.79 mg/dL (ref 0.4–1.2)
GFR calc Af Amer: >60 mL/min (ref >60)
GFR calc non Af Amer: >60 mL/min (ref >60)
Potassium: 4.6 mEq/L (ref 3.5–5.1)

## 2011-04-05 LAB — CBC
HCT: 41.2 % (ref 36.0–46.0)
Platelets: 301 10*3/uL (ref 150–400)
RBC: 4.34 MIL/uL (ref 3.87–5.11)
WBC: 9 10*3/uL (ref 4.0–10.5)

## 2011-04-05 LAB — URINALYSIS, ROUTINE W REFLEX MICROSCOPIC
Glucose, UA: NEGATIVE mg/dL
Nitrite: NEGATIVE
Specific Gravity, Urine: 1.009 (ref 1.005–1.030)
pH: 7.5 (ref 5.0–8.0)

## 2011-04-05 LAB — URINE MICROSCOPIC-ADD ON

## 2011-05-03 NOTE — Op Note (Signed)
[name], Patricia               ACCOUNT NO.:  [id number]   MEDICAL RECORD NO.:  [id number]          PATIENT TYPE:  INP   LOCATION:  5120                         FACILITY:  [facility]   PHYSICIAN:  Etter Sjogren, M.D.     DATE OF BIRTH:  [birth year]   DATE OF PROCEDURE:  02/19/2009  DATE OF DISCHARGE:                               OPERATIVE REPORT   PREOPERATIVE DIAGNOSIS:  Right breast cancer.   POSTOPERATIVE DIAGNOSIS:  Right breast cancer.   PROCEDURE PERFORMED:  Removed tissue expander and placed saline implant,  right chest.   SURGEON:  Etter Sjogren, MD   ANESTHESIA:  General.   ESTIMATED BLOOD LOSS:  Minimal.   DRAINS:  One Blake.   CLINICAL NOTE:  A 65 year old woman has breast cancer, has had  reconstruction with the tissue expander and now presents for removal of  expander and placement of implant.  Procedure was discussed at great  length.  She understands the risks and possible complications and wishes  to proceed.   DESCRIPTION OF PROCEDURE:  The patient was placed in a full upright  position in the holding area and marked for the removal of expander and  placement of implant with respect to the inframammary fold on each side.  She was then taken to the operating room and placed supine.  After  successful general anesthesia, she was prepped with Betadine and draped  in sterile drapes.  A 4-cm incision was made in the mid aspect of the  previous mastectomy scar.  Dissection was carried down through the  subcutaneous tissue to the underlying muscle.  The muscle splitting  incision was utilized and the tissue expander was identified and was  gently mobilized and removed after deflating and removing the saline  from it.  Meticulous hemostasis with the electrocautery.  Thorough  irrigation with saline after an inferior capsulotomy and then performed  for about a centimeter.  Excellent hemostasis was confirmed.  A Blake  drain was positioned and brought through the  separate stab wound  inferolaterally and secured with a 3-0 Prolene suture.  The implant was  prepared after thoroughly cleaning gloves.  This was a Mentor smooth  round high profile 380 maximum fill 450 mL implant, lot number [id number].  A 100 mL sterile saline was placed and the implant was soaked in  antibiotic solution for greater than 5 minutes.  Antibiotic solution was  also placed in the submuscular pocket and allowed to dwell there as well  for greater than 5 minutes.  The 3-0 Vicryl sutures were preplaced, but  left untied and the excellent hemostasis having been confirmed, the  implant was positioned.  The implant was then filled with the maximum  450 mL using a closed filling system and the position was noted to be  excellent.  The 3-0 Vicryl sutures were then tied securely, wound  irrigated again with saline and the wound closure with 3-0 Monocryl  introverted deep dermal sutures and running 3-0 Monocryl subcuticular  suture.  Steri-Strips, dry sterile dressings, circumferential Ace wrap  were applied, wrapping from superior to inferior to hold  the implant  inferiorly.  Tolerated well and she will be observed overnight.      Etter Sjogren, M.D.  Electronically Signed     DB/MEDQ  D:  02/19/2009  T:  02/20/2009  Job:  [id number]

## 2011-05-03 NOTE — Op Note (Signed)
[name], [name]               ACCOUNT NO.:  [id number]   MEDICAL RECORD NO.:  [id number]          PATIENT TYPE:  INP   LOCATION:  5124                         FACILITY:  [facility]   PHYSICIAN:  Sharlet Salina T. Hoxworth, M.D.DATE OF BIRTH:  1946/01/10   DATE OF PROCEDURE:  04/29/2008  DATE OF DISCHARGE:                               OPERATIVE REPORT   PREOPERATIVE DIAGNOSIS:  Carcinoma of the right breast.   POSTOPERATIVE DIAGNOSIS:  Carcinoma of the right breast.   SURGICAL PROCEDURES:  Right total mastectomy.   SURGEON:  Lorne Skeens. Hoxworth, M.D.   ANESTHESIA:  General.   BRIEF HISTORY:  [name] is a 65 year old female, recently  diagnosed with an invasive carcinoma of the right breast.  She has a  history of right chest radiation in the past due to Hodgkin disease.  She initially underwent needle-localized lumpectomy and sentinel lymph  node biopsy with findings of a T1b N0, 6-mm invasive carcinoma.  We  initially planned MammoSite, which was inserted, but this was found to  be too close to her rib as the lesion was very posterior in the breast.  Therefore, MammoSite radiation was not possible and we have elected to  proceed with total mastectomy.   INDICATIONS FOR PROCEDURE:  Risks of bleeding and infection were  discussed and understood.  Immediate reconstruction as planned by Dr.  Odis Luster.  The patient was brought to the operating room for this  procedure.   DESCRIPTION OF OPERATION:  The patient was brought to the operating room  and placed in supine position on the operating table.  General  anesthesia was induced.  She was carefully positioned with arms extended  and the entire chest was widely sterilely prepped and draped.  She  received preoperative antibiotics.  Correct patient and procedure were  verified.  An elliptical incision was planned encompassing the nipple-  areolar complex of the previous lumpectomy incision.  Skin and  subcutaneous flaps were then  raised superiorly up toward the clavicle,  inferiorly to the inframammary crease, medially to the edge of the  sternum, and laterally out to the anterior border of the latissimus  dorsi.  Dissection was deepened down to the level of the fascia.  The  breast was then reflected up off the pectoral fascia, working  mediolaterally using cautery.  It was freed from the edge of pectoralis  major.  The breast was then dissected up off the serratus and from the  inferior portion of the latissimus.  The pectoralis minor was freed and  the axillary contents were isolated down to the previous sentinel node  site, at which point axillary was divided between clamps and tied with 3-  0 Vicryl ties and the specimen was removed.  Hemostasis was assured.  The wound was thoroughly irrigated and Dr. Odis Luster then proceeded with  reconstruction.     Lorne Skeens. Hoxworth, M.D.  Electronically Signed    BTH/MEDQ  D:  04/29/2008  T:  04/29/2008  Job:  [id number]

## 2011-05-03 NOTE — Assessment & Plan Note (Signed)
[facility]                            CARDIOLOGY OFFICE NOTE   ERNESTYNE, CALDWELL                      MRN:          [id number]  DATE:03/22/2007                            DOB:          1946-05-24    [name] returns today for followup.  She has been having significant  palpitations and rapid heartbeats.  In November she said she had some  presyncope associated with this.  The initial episode only lasted for  about a minute.  She had another episode 2 weeks ago.  She had another  episode 2 weeks later on Thanksgiving.  She has had 2 episodes of  palpitations the last 2 days.  More recently, she has not had any  presyncope with it, but she feels very forceful heartbeats.   The patient is on thyroid replacement and needs her level checked.  She  has otherwise been compliant with her benazepril.  She has a history of  left bundle branch block and nonischemic cardiomyopathy with an EF in  the 45% to 50% range.   I told her that we probably needed to recheck her LV function and give  her a CardioNet monitor.  I explained to her that with her history of  nonischemic cardiomyopathy, I needed to rule out any significant  ventricular arrhythmias.   Patient's 10-point review of systems is otherwise negative outside of  the palpitations.   CURRENT MEDICATIONS:  Include:  1. Benazepril 10 mg a day.  2. Levothyroxine 112 mcg a day.   Her blood pressure is 130/77.  Pulse is 93 and regular.  HEENT:  Normal.  Carotids are normal.  There is no thyromegaly.  No nodules or goiter.  LUNGS:  Were clear.  There was an S1 and S2 with normal heart sounds.  ABDOMEN:  Benign.  LOWER EXTREMITIES:  Intact pulses.  No edema.  EKG showed sinus rhythm with bundle-type IVCD and left axis deviation.  I think today's EKG actually had some limb lead reversal as well.   IMPRESSION:  Palpitations and presyncope in a patient with a left bundle  block and nonischemic  cardiomyopathy.  She will have a CardioNet monitor  for the next 3 weeks so that we can rule out any significant  arrhythmias.   We will add Coreg 6.125 b.i.d. to her regimen.  Her blood pressure and  pulse seem to be a little high, and I think that this will help that in  her symptoms.  She will also have a followup 2D echocardiogram to  reassess her LV function.   We will check a TSH and T4 here today to make sure that her current dose  of Synthroid is appropriate.  Further recommendations will be based on  the results of these tests on her followup visit in about a month.     [name]. Eden Emms, MD, [facility]  Electronically Signed    PCN/MedQ  DD: 03/22/2007  DT: 03/22/2007  Job #: [id number]

## 2011-05-03 NOTE — Letter (Signed)
February 04, 2008    Lorne Skeens. Hoxworth, M.D.  [address], [address]  [place], [place] [address]   RE:  [name], [name]  MRN:  [id number]  /  DOB:  1946-08-20   Dear Romeo Apple:   Nolon Nations is anticipating lumpectomy and possible more extensive  breast surgery.  She was found to have a positive biopsy.   Her cardiac history is notable for non-ischemic myopathy with a negative  catheterization in 2005.  Repeat ultrasound in 2008 demonstrated an  ejection fraction in the 40% to 45% range and left bundle branch block.  I have been seeing her for palpitations which are related to atrial  arrhythmias, and we were able to get her on Nadolol which she has  tolerated well and which has been associated with significant  suppression of her ectopy.   We have also started her on Cozaar, in conjunction with the beta  blocker, because of her non-ischemic myopathy.  She is tolerating it  well.  Blood work following the initiation of ARB was stable.  She has  no chest pain or shortness of breath.  She has had no recurrent syncope.   MEDICATIONS:  Currently includes the Nadolol 40, Cozaar 50.  In addition  to the Carvedilol 8.75, a combination is defective as noted previously.   EXAMINATION:  VITAL SIGNS:  On examination, her blood pressure is  117/75, her pulse is 76.  NECK:  Her neck veins were 6 to 7 cm.  Her carotids were brisk.  BACK:  Her back was without kyphosis, scoliosis.  LUNGS:  Clear.  HEART:  Sounds were regular without murmurs or gallops.  ABDOMEN:  Soft with active bowel sounds.  EXTREMITIES:  Femoral pulses were 2+, distal pulses were intact.  There  is no clubbing, cyanosis, or edema.  NEUROLOGIC:  Exam was grossly normal.  SKIN:  Warm and dry.   Electrocardiogram obtained November 14 demonstrated sinus rhythm with  incomplete left bundle branch block, rather nonspecific IVCD with a QRS  duration of 128.   IMPRESSION:  1. Nonischemic cardiomyopathy.  2.  Intraventricular conduction delay.  3. Atrial arrhythmias.  4. Combination beta blocker therapy.   Mrs. Hupfer is doing really very well.  I expect that her risk for her  surgery, while mildly increased because of her cardiomyopathy should be  easily acceptable.  Please let us know at the time of her surgery, and  we will be glad to follow along.    Sincerely,      Duke Salvia, MD, [facility]  Electronically Signed    SCK/MedQ  DD: 02/04/2008  DT: 02/04/2008  Job #: [id number]

## 2011-05-03 NOTE — Assessment & Plan Note (Signed)
[facility]                         ELECTROPHYSIOLOGY OFFICE NOTE   NAME:HALESImagine, Nest                      MRN:          [id number]  DATE:05/10/2007                            DOB:          February 01, 1946    Ms. Tristan comes in.  She continues to have tachy palpitations.  We had  added Toprol-XL to her regimen, and she maintains are carvedilol at 12.5  b.i.d.  This has had some improvement but not significant improvement.   PHYSICAL EXAMINATION:  VITAL SIGNS:  Her blood pressure today was 122/70  with a pulse of 76.  LUNGS:  Clear.  HEART:  Heart sounds were regular.  EXTREMITIES:  Without edema.   IMPRESSION:  1. Multiple atrial arrhythmias.  2. Nonischemic cardiomyopathy.  3. Left bundle branch block.   Ms. Templeman has done some internet searching trying to understand what is  going on.  I applaud her efforts.   We discussed antiarrhythmic drug therapy as our next step, but in the  interim we will plan to put her on 100 mg of metoprolol and maintain her  on carvedilol 12.5.  Hopefully we will get sufficient beta blockade to  ameliorate her symptoms.   In the event that we do not, she is interested in pursuing  antiarrhythmic drug therapy.  She understands the risks of  proarrhythmia.  We will plan to admit her to the hospital and use  Tikosyn.   We will hold catheter ablation in abeyance.     Duke Salvia, MD, [facility]  Electronically Signed    SCK/MedQ  DD: 05/10/2007  DT: 05/10/2007  Job #: [id number]   cc:   [name]. Eden Emms, MD, [facility]

## 2011-05-03 NOTE — Assessment & Plan Note (Signed)
[facility]                            CARDIOLOGY OFFICE NOTE   IDIL, MASLANKA                      MRN:          [id number]  DATE:04/18/2008                            DOB:          June 02, 1946    Yolette returns today for follow-up.  She has a history of SVT, PAF  flutter, seen by myself and Dr. Graciela Husbands, on two different forms of beta  blocker.   She has a left bundle branch block, which is chronic.  She has had  previous radiation therapy for Hodgkin disease stage IIB.  Unfortunately, since I saw her she was diagnosed with breast cancer in  January.  She needs to have a mastectomy in 2 weeks.  In regards to  surgical clearance, I think it is fine for her to go have her surgery,  particularly since we are dealing with cancer.  Apparently the patient  had locally-invasive breast cancer but cannot take adjuvant radiation to  her previous dosages.  She opted to have a mastectomy instead and so  long as her PET scans are negative, I do not think she will opt for  adjuvant chemo.   The patient has significant valvular heart disease and likely  cardiomyopathy secondary to her previous radiation.  Her EF on an echo  from April 2008 showed an EF of 45% with moderate mitral insufficiency.   Her tachycardia has been somewhat quiescent on dual beta-blocker  therapy.  Dr. Odessa Fleming last note indicates possible use of Tikosyn if it  gets exacerbated.  She is on levothyroxine and I assume that Dr. Manus Gunning  has checked her TSH recently.  I have one from December 06, 2007, that  was 3.31.   Overall, I think Heli is an acceptable risk for surgery.  I would  like to do an echocardiogram this Monday prior to surgery to reestablish  her EF and degree of MR in regards to her risk of arrhythmia and heart  failure.   REVIEW OF SYSTEMS:  Remarkable for occasional palpitations and  fluttering, nothing sustained.  No presyncope, no PND, orthopnea, no  swelling.   No congestive heart failure and no chest pain.   CURRENT MEDICATIONS:  1. Nadolol 40 mg daily.  2. Levothyroxine 112 a day.  3. Calcium.  4. Cod liver oil.  5. Multivitamins.  6. Carvedilol 18.75 mg b.i.d.  7. Cozaar 50 mg a day.   Exam is remarkable for a healthy-appearing middle-aged white female in  no distress.  Her weight is 146, blood pressure 127/72, pulse 79 and regular,  respiratory rate 14.  HEENT:  Unremarkable.  Carotids normal without bruit.  No lymphadenopathy, thyromegaly or JVP  elevation.  LUNGS:  Clear with good diaphragmatic motion.  No wheezing.  S1 with a split second heart sound.  MR is difficult to hear.  PMI  normal.  ABDOMEN:  Benign.  Bowel sounds positive.  No AAA, no tenderness, no  hepatosplenomegaly or hepatojugular reflux, no bruit, no tenderness.  Distal pulse are intact, no edema.  NEUROLOGIC:  Nonfocal.  SKIN:  Warm and dry.  No muscular  weakness.   EKG shows a left bundle branch block.   IMPRESSION:  1. History of paroxysmal atrial fibrillation, paroxysmal      supraventricular tachycardia.  Continue dual beta-blocker therapy.      Consideration for Tikosyn if recurrences.  2. In regards to preoperative clearance, check 2-D echocardiogram to      reassess left ventricular function and mitral regurgitation.  Low      risk for myocardial infarction.  3. Left bundle branch block, chronic.  No evidence of high-grade heart      block or syncope.  4. Ejection fraction previously 45% with moderate mitral      regurgitation.  Check 2-D echocardiogram.  We will have to watch      volume status the perioperative period closely.  5. History of non-Hodgkin lymphoma limiting radiation use for adjuvant      therapy and breast cancer, likely valvular heart disease secondary      to above.  6. Breast cancer, to proceed with mastectomy so long as echo not      markedly changed, then will decide on adjuvant chemotherapy per Dr.      Donnie Coffin.  7.  Hypertension, currently well-controlled.  Continue Cozaar 50 mg a      day, low-salt diet.  8. Hypothyroidism.  TSH in a good range.  Continue Synthroid 112 mcg a      day.  Try not to over medicate in regards to history of paroxysmal      supraventricular tachycardia.   Overall, I think Novaleigh is coping well with her multiple illnesses and  I think she will do well with her mastectomy.     [name]. Eden Emms, MD, [facility]  Electronically Signed    PCN/MedQ  DD: 04/18/2008  DT: 04/18/2008  Job #: [id number]

## 2011-05-03 NOTE — Op Note (Signed)
NAMERAMEY, KETCHERSIDE               ACCOUNT NO.:  [id number]   MEDICAL RECORD NO.:  [id number]          PATIENT TYPE:  OIB   LOCATION:  4743                         FACILITY:  [facility]   PHYSICIAN:  Sharlet Salina T. Hoxworth, M.D.DATE OF BIRTH:  04/09/1946   DATE OF PROCEDURE:  03/05/2008  DATE OF DISCHARGE:                               OPERATIVE REPORT   PREOPERATIVE DIAGNOSIS:  Carcinoma of the right breast.   POSTOPERATIVE DIAGNOSIS:  Carcinoma of the right breast.   SURGICAL PROCEDURES:  1. Right breast blue dye injection.  2. Right axillary sentinel lymph node biopsy.  3. Needle localized right breast lumpectomy with insertion of      MammoSite cavity evaluation device.   SURGEON:  Lorne Skeens. Hoxworth, M.D.   ANESTHESIA:  General.   BRIEF HISTORY:  [name] is a 65 year old female who, on recent  mammogram, was found to have a new 2 cm area of calcifications and mass  in the posterior lateral right breast.  Large core needle biopsies  revealed invasive and intraductal carcinoma.  MRI confirms approximately  a 2 cm lesion.  The patient has received prior chest radiation for  Hodgkin's disease and is not a candidate for whole breast radiation, but  is a potential candidate for MammoSite partial breast radiation.  With  this collection of findings, we have elected to proceed with right  breast lumpectomy and MammoSite cavity evaluation device placement as  well as right axillary sentinel lymph node biopsy and possible axillary  dissection.  The nature of the procedure, indications, risks of  bleeding, infection and possible need for further surgery based on final  pathology findings have been discussed and understood.  She is now  brought to the operating room for these procedures.   DESCRIPTION OF OPERATION:  Following successful needle localization and  following injection of 1 mCi of technetium sulfa colloid intradermally  at the areola one hour prior to surgery, the  patient was brought to the  operating room and placed in the supine position on the operating table  and general endotracheal anesthesia was induced.  The right chest and  arm were sterilely prepped and draped.  She received preoperative IV  antibiotics.  The correct patient and procedure were verified.  The  patient had had bracketing wires inserted anteriorly and posteriorly to  the lesion from a lateral approach.  These appeared to be in good  position.  Initially, prior to prepping the breast, 10 mL of dilute  methylene blue was injected underneath the areola and massaged for  several minutes.  A NeoProbe was used to localize a definite hot area in  the right axilla.  A small incision was made here and using cautery and  blunt dissection, dissection was deepened down into the axilla and  immediately apparent was a bright blue lymphatic going to a blue lymph  node.  This lymph node was elevated and was hot by the NeoProbe.  It was  completely excised with cautery.  Ex vivo, the node had counts close to  2000 with background in the axilla of no greater than  40.  This was sent  as a hot blue sentinel lymph node for touch preps.  While awaiting this  report, I proceeded with the lumpectomy.   A curvilinear incision was made in the anterior breast just lateral to  the areola overlying the lesion and dissection was carried down through  the subcutaneous tissue onto the breast capsule.  Dissection was then  carried out laterally to the mass and the anterior and posterior  bracketing wires were encountered and brought into the incision.  A  specimen of breast tissue was then excised around the shaft and the core  of the wires.  The posterior wire lay right against the pectoralis  fascia and posteriorly, the specimen was taken up off of the pectoralis  fascia.  Grossly, the wires were well within the specimen and the mass  was palpable.  All margins appeared pretty widely negative grossly   except for the posterior margin which appeared somewhat close.  For this  reason, I took further posterior margin which consisted of the entire  pectoralis fascia down to muscle as a further permanent posterior  margin.  The specimen was oriented and sent for permanent pathology.   At this point, the sentinel lymph node report returned as negative for  cancer cells.  Preparation for placement of the MammoSite cavity  evaluation device noted to bring some more posterior soft tissue over  the chest wall.  The breast capsule was mobilized from the chest wall a  couple of cm in all directions.  This then allowed the posterior breast  capsule to be closed over the pectoralis muscle giving at least several  mm coverage posteriorly.  Hemostasis was obtained with cautery and the  soft tissue was infiltrated with Marcaine and the wound inspected for  complete hemostasis.  A 1 cm stab incision was then made laterally at  the closest distance to the cavity and the trocar inserted into the  cavity.  Following this, a tested CED was inserted and test inflated and  appeared to fill the cavity completely at 35 mL.  The balloon was  deflated and the breast capsules subcu was closed over this in two  layers with interrupted 3-0 Vicryl.  The skin was closed with running  subcuticular 4-0 Monocryl.  The axillary wound was closed with subcu  Vicryl and subcuticular Monocryl.   The balloon was then inflated to 35 mL.  Following this, intraoperative  ultrasound was performed showing good symmetry of the balloon and  margins at the soft tissue ranging from 17 mm anteriorly to 11 mm at the  thinness laterally.  Following this, the skin was cleaned and Dermabond  applied to the incisions.  A gauze dressing was applied to the MammoSite  CED.  The patient was taken to recovery in the good condition.  Sponge,  instrument, and needle counts were correct.      Lorne Skeens. Hoxworth, M.D.  Electronically  Signed     BTH/MEDQ  D:  03/05/2008  T:  03/05/2008  Job:  [id number]   cc:   Maryln Gottron, M.D.

## 2011-05-03 NOTE — Assessment & Plan Note (Signed)
[facility]                            CARDIOLOGY OFFICE NOTE   Patricia, [name]                      MRN:          [id number]  DATE:02/17/2009                            DOB:          06-27-1946    [name] returns today for followup.  She needed preop clearance.  She is  having a breast expander placed by Dr. Odis [name].  She has had a history of  breast cancer.   She had an EKG, which was abnormal.  She usually has an incomplete left  bundle-branch block.  She had significant ST-segment depressions in  leads II, III and F.  She has a history of nonischemic cardiomyopathy.  I was concerned about her EKG since it did look different than previous.  She subsequently had an adenosine Myoview study.  This was done on  February 24.  It was totally normal with no evidence of ischemia or  infarction.  Her EF was actually registered at 74%.  Her last 2-D  echocardiogram done on Apr 22, 2008, showed an EF of 50-55%.   Her blood pressure has been well controlled.  She has not had any  symptoms of PND, orthopnea, or chest pain.   REVIEW OF SYSTEMS:  Otherwise negative.   PAST MEDICAL HISTORY:  Remarkable for postpartum depression, Raynaud  disease, Hodgkin stage IIb disease, Lhermitte syndrome, history of  paroxysmal atrial fibrillation, left bundle-branch block, nonischemic  cardiomyopathy, and breast cancer with multiple surgeries.   CURRENT MEDICATIONS:  1. Cozaar 50 mg a day.  2. Carvedilol 8.75 b.i.d.  3. Multiple vitamins.  4. Synthroid 112 mcg a day.   PHYSICAL EXAMINATION:  GENERAL:  Remarkable for healthy-appearing female  in no distress.  VITAL SIGNS:  Blood pressure 120/70; pulse 68 and regular; and  respiratory rate 14, afebrile.  HEENT:  Unremarkable.  NECK:  Carotids are normal without bruit.  No lymphadenopathy, no  thyromegaly, or JVP elevation.  LUNGS:  Clear, good diaphragmatic motion.  No wheezing.  CARDIAC:  S1 and S2.  Normal  heart sounds.  PMI normal.  BREASTS:  She is status post breast cancer surgery.  ABDOMEN:  Bowel sounds are positive.  No AAA.  No tenderness.  No bruit.  No hepatosplenomegaly.  No hepatojugular reflux.  EXTREMITIES:  Distal pulses are intact.  No edema.  NEURO:  Nonfocal.  SKIN:  Warm and dry.  MUSCULOSKELETAL:  No muscular weakness.   IMPRESSION:  1. In regards to preoperative clearance, her Myoview is nonischemic.      She has a baseline abnormal EKG.  She is cleared for surgery.  2. Hypertension, currently well controlled.  Continue current dose of      beta-blocker and Cozaar.  3. Nonischemic cardiomyopathy, stable, functional class I.  Ejection      fraction seems to be improving, both by echo and by nuclear study.      Consider followup MRI in a year.  4. Breast cancer.  Follow up with Dr. Donnie [name].  Reconstructive surgery      with Dr. Odis [name] this Thursday.     [name]. Patricia Emms,  MD, [facility]  Electronically Signed    PCN/MedQ  DD: 02/17/2009  DT: 02/18/2009  Job #: [id number]

## 2011-05-03 NOTE — Letter (Signed)
December 24, 2007    [name]. Eden Emms, MD, [facility]  [address]  [address]  [place], [place] [address]   RE:  [name], [name]  MRN:  [id number]  /  DOB:  May 01, 1946   Dear Cindee Lame:   [name] is doing some better with her atrial arrhythmias.  We have  tried a variety of beta blockers and she is tolerating nadolol, except  for a small area breaking out underneath her right forehead.  She is not  sure this is related to her hair care products or not.  She has had one  episode of palpitations that occurred 2 days before New Year's Day.  It  lasted about an hour or 2.   On New Year's Day, she had been doing a bunch of work and she had spells  similar to what she has had some months ago, which was that she got  dizzy.  It was unassociated with palpitations.  She took her own pulse.  It was about 75.  Every time she stood up, she would feel faint or faint  again.  The spells lasted about an hour and then resolved.  She was  described by her husband as being quite pasty.   MEDICATIONS:  1. Nadolol at 40.  2. Carvedilol 18.75 (double beta blockers are appreciated).   She is not on an ACE inhibitor because of a cough and she is on  levothyroxine.   EXAMINATION:  Blood pressure is 127/74 with a pulse of 68.  LUNGS:  Clear.  HEART:  Sounds were regular.  NECK:  The veins were flat.  SKIN:  Warm and dry.  EXTREMITIES:  Without edema.   IMPRESSION:  1. Atrial arrhythmias.  2. Beta blockers for the above.  3. Nonischemic cardiomyopathy with:      a.     Ejection fraction 40-45%.      b.     Left bundle branch block.  4. Recurrent syncope.  5. Question skin breakout related to a beta blocker versus hair care      products.   Cindee Lame, [name] is doing better.  We have her on the beta blockers and  I have increased her nadolol to take 40 mg a day and to take an extra 40  if she is having a particularly bad day.   I have also started her on an ARB for her cardiomyopathy, specifically  Cozaar 50 mg a day and we will plan to check a BMET in about 3 weeks'  time.   I have asked her to follow up with you in about 2 months and will be  glad to see her again at your request.    Sincerely,      Duke Salvia, MD, [facility]  Electronically Signed    SCK/MedQ  DD: 12/24/2007  DT: 12/24/2007  Job #: [id number]   CC:    Bryan Lemma. Manus Gunning, M.D.

## 2011-05-03 NOTE — Assessment & Plan Note (Signed)
[facility]                            CARDIOLOGY OFFICE NOTE   [name]                      MRN:          [id number]  DATE:08/04/2008                            DOB:          03/13/1946    [name] returns today for followup.  We follow her for hypertension,  paroxysmal atrial fibrillation and a nonischemic cardiomyopathy.   She has had a bit of the time lately with her breast cancer.  She  ultimately had to have a right mastectomy.  She did not need adjuvant  chemo or radiation.  She is getting some reconstructive surgery done  with a breast expander.  She actually seems quite excited.  I think her  long-term prognosis is excellent.   In regards to her heart, she had an echo in May 2009, which showed an EF  of 50-55%.  She has chronic a left bundle-branch block.  She has not had  any PND, orthopnea or evidence of volume overload.  She has a history of  PAF.  She has been maintaining sinus rhythm.   She has not had any significant palpitations or syncope.   Overall, she is feeling quite well.   REVIEW OF SYSTEMS:  Otherwise negative.   CURRENT MEDICATIONS:  1. Nadolol 40 mg a day.  2. Synthroid 112 mcg a day.  3. Calcium.  4. Multivitamins.  5. Carvedilol 18.75 b.i.d.  6. Cozaar 50 a day.  7. Fish oil.   PHYSICAL EXAMINATION:  GENERAL:  Remarkable for a healthy-appearing,  middle-aged white female, in no distress.  VITAL SIGNS:  Weight is 149, blood pressure is 110/67, pulse 74 and  regular, respiratory rate 14 and afebrile.  HEENT:  Unremarkable.  NECK:  Carotids are without bruit.  No lymphadenopathy, thyromegaly, or  JVP elevation.  LUNGS:  Clear.  Good diaphragmatic motion.  No wheezing.  HEART:  S1 and S2 with normal heart sounds.  MR is not audible.  Status  post right breast mastectomy.  ABDOMEN:  Benign.  Bowel sounds positive.  No AAA.  No tenderness.  No  bruit.  No hepatosplenomegaly.  No hepatojugular reflux.   No tenderness.  EXTREMITIES:  Distal pulses are  intact.  No edema.  NEURO:  Nonfocal.  SKIN:  Warm and dry.  MUSCULOSKELETAL:  No muscular weakness.   Her electrocardiogram shows a chronic left bundle-branch block.   IMPRESSION:  1. Cardiomyopathy.  Presumed nonischemic.  Improved ejection fraction      on last echo.  Continue ARB, currently euvolemic.  2. Chronic left bundle-branch block.  Likely related to      cardiomyopathic process, stable.  No evidence of high-grade heart      block, tolerating current dose of beta-blocker.  3. History of paroxysmal atrial fibrillation, maintaining sinus      rhythm.  Continue baby aspirin.  4. History of a breast cancer.  Follow up Dr. Pierce [name].  Good, did      not receive any adjuvant chemotherapy or radiation that could have      exacerbated her heart condition.  5. History of hypothyroidism.  Continue replacement.  TSH and T4 per      primary care MD.   Overall, I am glad that [name]'s breast cancer is being treated well.  I will see her back in a year.  We will assess an echocardiogram at that  time.     [name]. Eden Emms, MD, [facility]  Electronically Signed    PCN/MedQ  DD: 08/04/2008  DT: 08/04/2008  Job #: [id number]

## 2011-05-03 NOTE — Assessment & Plan Note (Signed)
[facility]                         ELECTROPHYSIOLOGY OFFICE NOTE   [name], [name]                      MRN:          [id number]  DATE:06/04/2007                            DOB:          03/31/1946    [name] comes in. She has had supraventricular tachyarrhythmias some  of which have been regular and others not so regular and my thought has  been this is either SVT or probably more likely a regular atrial  fibrillation with a very rapid ventricular response. She also has a  nonischemic cardiomyopathy the cause of which is not clearly understood  whether it is related to her Hodgkin's and therapy or whether it is  potentially related to her rate.   She had some more significant symptoms the other day. She comes in now  to discuss options. We had talked about using Tikosyn next but I think  as an interlude I would like to suggest that we increase her Coreg to  18.75 and to add magnesium oxide. I have reviewed this extensively with  her as well as the potential role of Tikosyn and the issues again of  proarrhythmia and why she would require hospitalization and she  understands.   We will plan, if we are able to render her symptom free, to get a 24-  hour monitor to make sure that we have not an excessive arrhythmia  burden.   PHYSICAL EXAMINATION:  VITAL SIGNS:  On examination today, I should note  that her blood pressure was 128/78, her pulse was 78.  LUNGS:  Clear.  HEART:  Sounds were regular.   We will see her again in 4 weeks.     Duke Salvia, MD, [facility]  Electronically Signed    SCK/MedQ  DD: 06/04/2007  DT: 06/05/2007  Job #: [id number]

## 2011-05-03 NOTE — Assessment & Plan Note (Signed)
[facility]                         ELECTROPHYSIOLOGY OFFICE NOTE   NAME:[name], [name]                      MRN:          [id number]  DATE:11/02/2007                            DOB:          1946/12/11    [name] continues to have palpitations though these are much improved  on the combination of Coreg and Metoprolol.  She has modest improvement  of LV function.   She continues to complain however of breaking out which she attributes  to the Metoprolol.   Her other medications include levothyroxine, carvedilol 18.75, Mag-  oxide.   ON EXAMINATION:  Her blood pressure today was 117/72, her pulse was 76,  her lungs were clear, heart sounds were regular and the extremities were  without edema.   Electrocardiogram demonstrated a sinus rhythm at 76 with nonspecific  IVCD with a QRS duration of about 130 milliseconds which is stable.   IMPRESSION:  1. Multiple arrhythmias including atrial fibrillation, atrial      tachycardia.  2. A modest depression of LV systolic function.  3. Dermatological reaction attributed to her Metoprolol.   We will plan to discontinue the Metoprolol.  I have given her  prescriptions initially for Inderal LA 120, nadolol 40 and Atenolol 50  to try in random order to see if we can find one which is associated  with fewer side effects.  We will see her again in 6 week's time to  proceed with this.     Duke Salvia, MD, [facility]  Electronically Signed    SCK/MedQ  DD: 11/02/2007  DT: 11/02/2007  Job #: [id number]   cc:   [name]. [name], M.D.

## 2011-05-03 NOTE — Op Note (Signed)
NAMELILLE, [name]               ACCOUNT NO.:  [id number]   MEDICAL RECORD NO.:  [id number]          PATIENT TYPE:  INP   LOCATION:  5124                         FACILITY:  [facility]   PHYSICIAN:  Etter Sjogren, M.D.     DATE OF BIRTH:  11-24-1946   DATE OF PROCEDURE:  04/29/2008  DATE OF DISCHARGE:                               OPERATIVE REPORT   PREOPERATIVE DIAGNOSIS:  Right breast cancer.   POSTOPERATIVE DIAGNOSES:  Right breast cancer.   PROCEDURE PERFORMED:  1. Right breast reconstruction with placement of tissue expander.  2. Use of HD Flex hydrated allograft for breast reconstruction.   SURGEON:  Etter Sjogren, M.D.   ANESTHESIA:  General.   ESTIMATED BLOOD LOSS:  Minimal.   DRAINS:  Two Blake were left.   CLINICAL NOTE:  This 61-year woman has right breast cancer, desired  reconstruction.  This was discussed with her in detail, although she has  had previous mantle radiation for lymphoma, this was just the medial  third of the breast and was not felt to be a contraindication for a  breast reconstruction with tissue expander.  Procedure was discussed  with her in detail, and she understood the risks and possible  complications including but not limited to bleeding, infection,  anesthesia complications, healing problems, scarring, fluid collection,  displacement of expander, asymmetry, disappointment, failure of the  expander, failure of the eventual implant, cuts for contracture; and she  wished to proceed.   DESCRIPTION OF PROCEDURE:  The patient was in the operating room and  mastectomy was completed.  The dissection was carried deep to pectoralis  major muscle.  Submuscular space was developed.  There was an area  medially where there was some missing muscle, and this was reinforced  with HD Flex, securing it with a running 3-0 PDS suture.  Having  developed the submuscular pocket down to the inframammary crease, the  wound was irrigated thoroughly with saline.   Meticulous hemostasis with  the electrocautery.  The expander was prepared after thoroughly cleaning  gloves.  This was an Allergan tissue expander, 400 mL, 133 in the style,  and 450 mL sterile saline was placed using a closed filling system and  the expander was soaked with antibiotic solution for greater than 5  minutes.  Antibiotic solution was also placed in the submuscular space.  Blake drains were positioned, brought through separate stab wounds  inferiorly and secured with 3-0 Prolene sutures.  The tissue expander  was positioned.  Care was taken to make sure which was down to the  inferior limits of the dissection of the submuscular pocket.  The  expander care was taken to make sure which was lying flat as well.  The  [facility] flex was then used to complete the closure laterally and secured with  3-0 PDS sutures, taking great care again to avoid damage to the  underlying tissue expander.  The antibiotic solution again continued to  do well in the mastectomy pocket as the closure was completed  using 3-0 Monocryl  introverted deep dermal sutures and running 3-0  Monocryl subcuticular  suture.  Steri-Strips, dry sterile dressings,  circumferential Ace wrap were applied.  Transferred to the recovery room  in stable having procedure well.      Etter Sjogren, M.D.  Electronically Signed     DB/MEDQ  D:  04/29/2008  T:  04/30/2008  Job:  [id number]

## 2011-05-03 NOTE — Letter (Signed)
July 02, 2007    [name]. Eden Emms, MD, [facility]  [address]  [address]  [place], [place] [address]   RE:  [name], [name]  MRN:  [id number]  /  DOB:  02/11/1946   Dear Cindee Lame,   [name] has been seen back.  She is feeling much better currently on  her medical regimen, which includes Coreg 18.75 b.i.d., metoprolol 100 a  day, and mag oxide.  She has had no significant palpitations and has had  no untoward effects of which she is aware.   PHYSICAL EXAMINATION:  VITAL SIGNS:  Blood pressure 112/80, pulse 68.  LUNGS:  Clear.  CARDIAC:  Heart sounds were regular.   IMPRESSION:  1. Atrial fibrillation.  2. [place] score of 0 with a minimal amount of ischemic cardiomyopathy      without evidence of congestive heart failure.   We will plan to continue her on her current medications.  We had a long  discussion today about aspirin versus Coumadin.  I think it is  reasonable to maintain her on aspirin currently without  Coumadin.  I will see her one more time in about three months and if  everything is stable, we will have her come back and see you after that.   Thank you very much for allowing [place] to participate in her care.    Sincerely,      Duke Salvia, MD, [facility]  Electronically Signed    SCK/MedQ  DD: 07/02/2007  DT: 07/03/2007  Job #: [id number]

## 2011-05-06 NOTE — H&P (Signed)
NAME:  [name], [name]                         ACCOUNT NO.:  [id number]   MEDICAL RECORD NO.:  [id number]                   PATIENT TYPE:  INP   LOCATION:  5507                                 FACILITY:  [facility]   PHYSICIAN:  Corinna L. Lendell Caprice, MD             DATE OF BIRTH:  1946-12-04   DATE OF ADMISSION:  12/08/2003  DATE OF DISCHARGE:                                HISTORY & PHYSICAL   CHIEF COMPLAINT:  Chest pain.   HISTORY OF PRESENT ILLNESS:  Ms. [name] is a 65 year old white female who  presents via EMS to the emergency room with complaints of sharp chest pain  under her breast.  It lasted a few seconds several times today.  She is  worried that it maybe her heart.  She had an episode last week with left-  sided chest heaviness and should pain, which felt different.  She was  scheduled to have an appointment with Dr. Charlton Haws tomorrow to further  evaluate this chest pain.  She saw Dr. Manus Gunning in his office today and had  an EKG, which showed a left bundle branch block.  She has no history of  coronary artery disease.  She reports that she started getting a cough  today.  She denies fevers or chills.  The pain did not occur with exertion.  There were no alleviating or exacerbating factors.  She is usually quite  active and has no exertional chest pains.  She also has been slightly short  of breath.   PAST MEDICAL HISTORY:  1. Raynaud's phenomenon.  2. Hodgkins' lymphoma n 1988 status post splenectomy and radiation.  3. Hypothyroidism.   MEDICATIONS:  1. Levoxyl 100 mcg alternating with 112 mcg daily.  2. The patient also takes calcium supplementation.  3. Vitamin E.  4. Algae tablets.  5. Grape seed tablets.  6. Flaxseed oil.   ALLERGIES:  The patient has no known drug allergies.   SOCIAL HISTORY:  The patient does not smoke or drink.  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   FAMILY HISTORY:  Family history is negative for early coronary artery  disease.   REVIEW OF SYSTEMS:  CONSTITUTIONAL:  As above.  HEENT:  No headache.  No  sore throat.  No rhinorrhea.  No ear pain.  RESPIRATORY:  As above.  CARDIOVASCULAR:  As above.  GASTROINTESTINAL:  No nausea or vomiting.  GENITOURINARY:  No dysuria.  MUSCULOSKELETAL:  No arthralgias or myalgias.  SKIN:  No rash.  PSYCHIATRIC:  The patient has been very anxious recently  about her chest pain.  NEUROLOGIC:  No seizures.  ENDOCRINE:  No diabetes.   PHYSICAL EXAMINATION:  VITAL SIGNS:  On physical examination her blood  pressure initially is 197/105, temperature 98.1, pulse 114 and respiratory  rate 16.  Oxygen saturation initially, 90% on room air; however, her oxygen  level now when her oxygen is removed  decreased into the 84 percentile range.  GENERAL APPEARANCE:  In general the patient is well-nourished, well-  developed, appears comfortable.  HEENT:  Normocephalic and atraumatic.  Pupils equal, round and react to  light.  Sclerae nonicteric.  Moist mucous membranes.  Oropharynx is without  erythema or exudate.  NECK:  Neck is supple.  No lymphadenopathy.  No thyromegaly.  LUNGS:  The patient has an anterior friction rub, possibly pleural versus  pericardial, and maybe a few bibasilar rales.  No wheezes or rhonchi.  CARDIOVASCULAR:  Regular rate and rhythm without murmurs or gallops.  ABDOMEN:  Abdomen is soft, nontender and nondistended.  GENITALIA AND RECTAL:  GU and rectal are deferred.  EXTREMITIES:  No clubbing, cyanosis or edema.  SKIN:  No rash.  PSYCHIATRIC:  Normal affect.  NECK:  Alert and oriented.  Cranial nerves, and sensory and motor exams are  intact.   LABORATORY DATA:  CBC is normal.  Basic metabolic panel normal.  ABGs on  100% FIO2 reveals a pH of 7.384, pCO2 43, pO2 231.  EKG shows normal sinus  rhythm with a left bundle branch block.  CAT scan of the chest shows no  pulmonary embolus, but shows bilateral air space disease, probably  pneumonia.   ASSESSMENT AND PLAN:   1. Chest pain, doubt cardiac.  This may possibly be pleurisy; however, I will admit patient to telemetry  and get another set of enzymes.  As she had an appointment with Dr. Charlton Haws tomorrow, consider discussing the case with him to see whether  he  would like to get a Cardiolite while the patient is here in the hospital.   1. Probable pneumonia.  Given the results of computerized axial tomographic scan and the patient's  cough and hypoxia, the patient is afebrile, but I will get blood cultures as  this is a presumptive pneumonia.  She will get IV Rocephin and azithromycin.  She will get supplemental oxygen and a cough suppressant.   1. Hypothyroidism.  Continue her Levoxyl.   1. Elevated blood pressure.  The patient has no history of hypertension.  I question whether this is  related to her pain and/or anxiety; but, this will be monitored and  metoprolol will be given as needed.   1. Left bundle branch block.                                                Corinna L. Lendell Caprice, MD   CLS/MEDQ  D:  12/08/2003  T:  12/09/2003  Job:  [id number]   cc:   Bryan Lemma. Manus Gunning, M.D.  [address] [place]  [place] [address]  Fax: [phone]

## 2011-05-06 NOTE — Assessment & Plan Note (Signed)
[facility]                            CARDIOLOGY OFFICE NOTE   ELVENIA, GODDEN                      MRN:          [id number]  DATE:04/12/2007                            DOB:          11-04-1946    Mrs. [name] seen today in follow up.  I have been seeing her for  palpitation.  During her last visit, we started her on Coreg, her  palpitations have improved.  She has a CardioNet monitor on.  She did  have an episode yesterday of rapid palpitations lasting a minute or 2,  she did feel a little presyncopal with it.  There was no significant  chest pain or shortness of breath.  She is not having any recurrences.  We are trying to track the CardioNet monitor to see what it involved.   The patient has a history of cardiomyopathy with a left bundle branch  block and an EF of 45%.  She had a normal cath in 2005 without coronary  artery disease.  Her echo which was done after her last visit shows  moderate MR with an EF of 45%.   I had a long discussion with Charlot regarding the possibilities of PAF.   She understands the diagnosis of nonischemic cardiomyopathy and also we  discussed the issues with moderate MR and left atrial enlargement.   Her palpitations have clearly improved on the Coreg.  Since her blood  pressure and heart rate are still good, we will increase it to 12.5 mg  b.i.d.   REVIEW OF SYSTEMS:  Remarkable for a cough.  She has seen Dr. __________  for this.  Her benazepril was stopped and her cough is improved.  I told  her that she should list ACE inhibitors as an allergy secondary to  coughing.   Her review of systems otherwise negative.   She is otherwise doing well.  She continues to work for her [employer] and is going to [employer] [place] [employer] trip soon.  I told her I  thought it was safe for her drive.   EXAMINATION:  Blood pressure 128/70, pulse 72 and regular.  HEENT:  Normal, there is no thyromegaly.  LUNGS:   Clear.  HEART:  S1, S2 with normal heart sounds, I can not hear any murmur.  ABDOMEN:  Benign.  LOWER EXTREMITIES:  Intact pulses, no edema.  SKIN:  Warm and dry.  NEURO:  Non focal.   IMPRESSION:  The patient's palpitations likely PAC's and not atrial  fibrillation.   We will try to get the results of her CardioNet monitor.   We will increase her Coreg to 12.5 mg b.i.d.   The patient will continue to wear her monitor for the next 3 weeks.   Her TSH and T4 were normal, so they will not be contributing to her  palpitations.   In regards to her blood pressure I think titrating her Coreg higher will  suffice and I think that the benazepril will no longer be needed.  In  the future if she needed hypertensive medicine I would use a ARB.  The patient will continue a baby aspirin a day.  There is currently no  obvious indication for Coumadin.   The patient will call us if her palpitations worsen or are starting to  cause presyncope.   I will see her back in about 4 weeks.     [name]. Eden Emms, MD, [facility]  Electronically Signed    PCN/MedQ  DD: 04/12/2007  DT: 04/12/2007  Job #: [id number]

## 2011-05-06 NOTE — Discharge Summary (Signed)
[name], [name]                         ACCOUNT NO.:  [id number]   MEDICAL RECORD NO.:  [id number]                   PATIENT TYPE:  INP   LOCATION:  5507                                 FACILITY:  [facility]   PHYSICIAN:  Ike Bene, M.D.              DATE OF BIRTH:  1946/08/31   DATE OF ADMISSION:  12/08/2003  DATE OF DISCHARGE:  12/11/2003                                 DISCHARGE SUMMARY   DISCHARGE DIAGNOSES:  1. Atypical pneumonia.  2. History of Hodgkin's disease.  3. Hypothyroidism.  4. Chronic left bundle branch block, unchanged.   HISTORY:  [name] was admitted to the hospital with atypical chest pain.  She had evidence of cough and hypoxia.  Ultimately, it was found that she  had an atypical pneumonia.  This was clearly seen on the CT scan of her  chest which was done to rule out pulmonary embolism.  Also, cardiac  isoenzymes were obtained to rule out myocardial infarction.  That was normal  during her hospitalization.   The patient was treated with IV azithromycin and Rocephin.   The patient markedly improved within 24 hours of admission.  Her oxygenation  also improved to within normal limits within 24 hours of admission.  She  never developed a fever during her hospitalization.   The patient was observed another 24 hours after IV antibiotics were changed  to oral antibiotics.  She continued to do well.  She maintained a 99% oxygen  saturation on room air.   The patient was clinically stable for discharge to home.   Because of the patient's right bundle branch block, the decision was made  that she probably needs further outpatient evaluation for cardiac disease,  although think it is unlikely that that was the etiology of any of her  current symptomatology.   With regard to laboratories, the patient has a normal white count throughout  her hospitalization.  Other metabolic panels were well within normal limits.  Her blood cultures remained normal.   Cardiac isoenzymes were all within  normal limits.  She had no arrhythmias at all during hospitalization.   CT scan showed no evidence of  pulmonary edema  but only bilateral airspace  disease consistent with pulmonary infiltrate, consistent with infection.   The decision was made to discharge the patient to home.  She could follow up  with Dr. Serafina Royals, her primary care physician one week after discharge.  If she  has any problems, she was to call our office.                                               Ike Bene, M.D.   RM/MEDQ  D:  03/18/2004  T:  03/19/2004  Job:  [id number]

## 2011-05-06 NOTE — Assessment & Plan Note (Signed)
[facility]                                 ON-CALL NOTE   NAME:[name], [name]                      MRN:          [id number]  DATE:04/14/2007                            DOB:          17-May-1946    TELEPHONE PROGRESS NOTE:  I was called by Cardio-Net regarding an  episode of SVT from [name]. She had SVT, which lasted a minute and a  half, up to a heart rate of 210 beats per minute. She had not had a  followup recording. I called [name], to followup and see how she was  doing. She states that she is feeling fine and right now, she is driving  home from [place]. When she arrives home, she will give [place] another  call in with another strip. She has been on Coreg with her SVT, which is  known and this has been improving her symptoms. She took a dose of Coreg  this morning and will take a dose this evening. Should she have  persistent symptoms, she has been instructed to come immediately to the  emergency room for further evaluation.     Lorain Childes, MD  Electronically Signed    CGF/MedQ  DD: 04/14/2007  DT: 04/14/2007  Job #: [id number]

## 2011-05-06 NOTE — Assessment & Plan Note (Signed)
[facility]                                 ON-CALL NOTE   [name], [name]                      MRN:          [id number]  DATE:04/14/2007                            DOB:          [birth year]    Cardiologist is Dr. Charlton Haws.  Phone number [phone], cell phone  number [phone].   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 Eden Emms, who recently had seen him for palpitations.  She  had an echocardiogram performed that revealed an EF of 45 to 50% on  March 26, 2007.  She also had moderate mitral regurgitation.  She has a  history of hypertension as well as Hodgkin's disease.  Denies any  history of diabetes mellitus or stroke.  She is wearing a Cardio-Net  monitor.  Dr. Eden Emms recently increased her Coreg to 12.5 mg twice a  day.  She had a couple of episodes of palpitations today.  She activated  the Cardio-Net once, and they called me with a rhythm of SVT.  I asked  them to fax me the strips.  Her strips actually look like she was in  atrial fibrillation with a rapid ventricular rate.  The rhythm does go  as high as about 218 beats per minute.  When she goes that fast, it is  quite regular.  The patient tells me over the phone that she was told  when she saw Dr. Eden Emms that she was probably having atrial flutter.  This could certainly be atrial flutter with a 1:1 conduction versus SVT.  The patient tells me that she now feels back to normal.  She was working  out in the yard when these episodes happened.  She was visiting her  mother-in-law in [place].  She is now in route back to [facility] via  private vehicle.  She noted some near syncope when she had her second  episode today.  She denied chest pain or shortness of breath.  She does  get a little short of breath with her palpitations, but no other  shortness of breath.   PLAN:  I will leave a message at the office for the patient to be  contacted Monday for followup.  She apparently sees Dr. Eden Emms next  week.  In the interim, I have asked the patient to start on an aspirin a  day, 81 mg.  She is agreeable to this.  If she has any recurrent  palpitations that last longer or if she has worsening symptoms with her  palpitations, she should go to the emergency room.   DISPOSITION:  The patient agrees to the plan above.     Tereso Newcomer, PA-C  Electronically Signed    SW/MedQ  DD: 04/14/2007  DT: 04/14/2007  Job #: [id number]

## 2011-05-06 NOTE — Assessment & Plan Note (Signed)
[facility]                            CARDIOLOGY OFFICE NOTE   FLORIA, BRANDAU                      MRN:          [id number]  DATE:04/19/2007                            DOB:          [birth year]    [name] returns today for followup. I have seen her for pre-syncope and  palpitations. Her CardioNet monitor was quite revealing. She had  multiple episodes of PSVT, some with rates as high as 200. She had  sudden onset and offset of the rhythm.   The patient has had some pre-syncope with these episodes.   There has been no chest pain. Interestingly, she has a non-ischemic  cardiomyopathy. She does have an IVCD on her electrocardiogram and there  is a question in my mind as to whether this is a fascicular tachycardia  versus an AV nodal tachycardia. In either case, I think there is a high  likelihood that she would have an ablatable rhythm. I talked to her at  length today. We spent over 20 minutes talking about specific ablations.   She is quite smart and has done some research as well as talked to some  friends. I think it was particularly important for [name] to realize  that not all ablations are alike. I discussed some of the differences  between atrial fibrillation/atrial flutter, VT, AV nodal and fascicular  ablations. Again, I also explained to her that Dr.  Graciela Husbands was the expert  and would be able to give her better information.   We also specifically talked about the need for Coumadin and in her  particular case, I think aspirin is enough. We have up-titrated her  Coreg to 12.5 b.i.d.  Her other medications include:  Levothyroxine 112  a day with a normal TSH recently, an aspirin a day and Coreg 12.5 b.i.d.   REVIEW OF SYSTEMS:  Is otherwise negative.   Her last ejection fraction was in the 45% range by MRI.   Her last TSH on March 22, 2007, was normal at 0.87.   PHYSICAL EXAMINATION:  Is remarkable for a healthy-appearing female.  VITAL SIGNS: Currently are a blood pressure of 120/72, pulse 70 and  regular, weight is 136, respiratory rate is 10.  HEENT: Is otherwise unremarkable. There is no thyromegaly, no JVP  elevation.  LUNGS:  Are clear.  There is an S1, S2 with normal heart sounds. PMI is not enlarged.  ABDOMEN: Is benign.  LOWER EXTREMITIES: Intact pulses. No edema.  NEURO: Nonfocal.  SKIN: Is warm and dry.  There is no muscular weakness.   As indicated, her baseline EKG shows sinus rhythm with an IVCD and left  axis deviation.   IMPRESSION:  Multiple episodes of PSVT, possibly AV nodal re-entrant  versus fascicular. Continue Coreg for rate control. The patient to see  Dr.  Graciela Husbands and hopefully be referred for electrophysiology study and  ablation.   All of the patient's questions were answered and she is looking forward  to meeting Dr.  Graciela Husbands on Monday.     [name]. Eden Emms, MD, [facility]  Electronically Signed    PCN/MedQ  DD: 04/19/2007  DT: 04/19/2007  Job #: [id number]

## 2011-05-06 NOTE — Letter (Signed)
Apr 23, 2007    [name]. Eden Emms, MD, [facility]  [address]  [address]  [place], [place] [address]   RE:  SAAVI, MCEACHRON  MRN:  [id number]  /  DOB:  01-25-1946   Dear [name]:   It was a pleasure to see [name] today at your request because of  recurrent tachy-palpitations.   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 Dewayne Shorter.   She has a remote history of Hodgkin's disease in the mid 71s for which  she received mantle radiation, but no chemotherapy.  She was found when  she presented with chest pain in the fall of 2004 and undergoing  evaluation to have a nonischemic cardiomyopathy with an ejection  fraction of 45%, subsequently confirmed by both echo and more recently  by MRI.   About 10 years ago, she started having some episodes of palpitations  associated with caffeine and she decaffeinated herself.   More recently, she began in November having episodes of abrupt-onset  offset tachy-palpitations which were characterized by being irregular,  frequently sometimes regular.  They were associated with presyncope.  They were diuretic-negative and FROG-negative.   I should note that she has treated hypothyroidism and her thyroid  functions were normal.   You undertook a CardioNet monitor, which is interesting in that it  demonstrates frequent episodes of sustained and nonsustained  tachycardia.  On one strip, there are clear flutter waves; on most of  the strips, there are not.  There is frequent irregularity, but there is  also some regularity and it is hard to tell whether it is regularization  of atrial fibrillation or something different.   You started her on Coreg; this has not been associated with any  significant benefit here to date.   I should also note that she was previously on an ACE inhibitor and Dr.  Manus Gunning stopped  this because of a cough.   Her thromboembolic risk factors are negative for hypertension, diabetes,  age and prior stroke.   Her past surgical history is notable for a splenectomy at the time of  her Hodgkin's disease and vein stripping.   Her allergies include EPINEPHRINE and ACE INHIBITORS.   Her medications currently include levothyroxine 112, benazepril 10,  recently stopped, carvedilol 12.5 and a variety of nutraceuticals as  well as aspirin at 81.   SOCIAL HISTORY:  She is married.  She has 3 children and 6  grandchildren.  She does not use cigarettes, alcohol or recreational  drugs.   REVIEW OF SYSTEMS:  Noncontributory.   EXAMINATION:  She is a middle-to-older Caucasian female appearing her  stated age of 76.  Her blood pressure is the supine position was 140/80  with a pulse of 62; sitting, it was 130/76 with a pulse of 74; standing,  it was 124/80 with a pulse of 76; at 2 minutes it was 140/76 with a  pulse of 78 and at 5 minutes, it was 144/78 with a pulse of 75.  Her  HEENT exam demonstrated no icterus or xanthomata.  The neck veins were  flat.  The carotids were brisk and full bilaterally without bruits.  Back was without kyphosis or scoliosis.  Lungs were clear.  Heart sounds  were regular with a widely split and paradoxically split S2.  The  abdomen was soft  with active bowel sounds, without midline pulsation or  hepatomegaly.  Femoral pulses were 2+.  Distal pulses were intact and  there was no clubbing, cyanosis, or edema.  The neurological exam was  grossly normal.  The skin was warm and dry.   Electrocardiogram dated the other day and not repeated unfortunately  demonstrated a sinus rhythm with left bundle branch block and right axis  deviation; I suspect that this represents a limb lead error, which would  be consistent with the electrocardiogram from December 2006, which had a  mild leftward axis.   IMPRESSION:  1. Recurrent and multiple supraventricular  arrhythmias including:      a.     Nonsustained atrial tachycardia/fibrillation.      b.     Atrial flutter.      c.     Narrow QRS tachycardia that is regular.      d.     Narrow QRS tachycardia that is irregular.  2. Left bundle branch block.  3. Nonischemic cardiomyopathy.  4. Extreme axis deviation I presume related to lead limb malposition      and we will need to have this confirmed.  5. Presyncope likely related to #1.   The major issue, [name], is the tachycardia, which I presume is related  to her presyncope.  Coreg is a great drug for her cardiomyopathy;  however, I am not quite sure how great an AV nodal slowing agent it is  and so I have taken the liberty of decreasing her Coreg to 6.25 b.i.d.  and putting her on Toprol 50 mg a day.  Other adjunctive medications  could include Lanoxin.   In the event that we are unsuccessful in being able to control her rate  this way, I would recommend the use of Tikosyn.  She would need to be  hospitalized for this.  RF catheter ablation would be a reasonable  undertaking if we could not control her rhythm this way, but as she has  atrial fibrillation, most centers are focusing on patients who have had  drug failure, as the risk/benefit assessment is moderate, but not as  high as it is with other ablation procedures.   I have reviewed this with her and her husband extensively.  They are to  call me next week and I will see her in the office in 2 weeks.   Thank you for the consultation.    Sincerely,      Duke Salvia, MD, [facility]  Electronically Signed    SCK/MedQ  DD: 04/23/2007  DT: 04/24/2007  Job #: [id number]   CC:    Bryan Lemma. Manus Gunning, M.D.

## 2011-05-06 NOTE — Cardiovascular Report (Signed)
NAME:  [name], [name]                         ACCOUNT NO.:  [id number]   MEDICAL RECORD NO.:  [id number]                   PATIENT TYPE:  OIB   LOCATION:  6501                                 FACILITY:  [facility]   PHYSICIAN:  Learta Codding, M.D. [facility]             DATE OF BIRTH:  1946/09/13   DATE OF PROCEDURE:  03/05/2004  DATE OF DISCHARGE:  03/05/2004                              CARDIAC CATHETERIZATION   PROCEDURE PERFORMED:  1. Left heart catheterization with selective coronary angiography.  2. Ventriculography.   DIAGNOSIS:  No evidence of significant flow-limiting coronary artery  disease.   INDICATION:  The patient is a 65 year old patient of Dr. __________.  The  patient has a prior history of radiation for Hodgkin's disease.  She has  been complaining though recently of chest pain and shortness of breath.  She  has been referred for cardiac catheterization to assess her coronary  anatomy.   DESCRIPTION OF PROCEDURE:  After informed consent was obtained, the patient  was brought to the catheterization laboratory.  4-French arterial sheath was  placed using modified Seldinger technique.  Next, 4 [place] JL-4 and JR-4  catheters were used for left and right coronary angiography.  Ventriculography was performed with 4 French pigtail catheter.  At  termination of the procedure, all catheters and sheaths were removed and no  complications were encountered.   FINDINGS:   HEMODYNAMICS:  Left ventricular pressure 157/10 mmHg, aortic pressure 157/85  mmHg.   VENTRICULOGRAPHY:  Ejection fraction 45-50% with distal inferior  hypokinesis.  No other wall motion abnormalities.   SELECTIVE CORONARY ANGIOGRAPHY:  1. Left main coronary artery is a large caliber vessel with diffuse plaquing     of approximately 10% of calcification.  2. Left anterior descending  artery is a large caliber vessel with no flow-     limiting disease.  Diagonal branches were free of flow-limiting coronary  artery disease.  3. Right coronary artery is dominant.   CONCLUSION:  No evidence of significant flow-limiting epicardial coronary  artery disease.  Has no evidence of flow-limiting:  No evidence of flow-  liming disease.  1. Circulation was right dominant.  2. Left anterior descending artery had a proximal 30% diffuse stenosis.  The     patient's chest pain appears to be noncardiac.  Workup per her primary     care physician may be indicated.                                               Learta Codding, M.D. [facility]    GED/MEDQ  D:  04/22/2004  T:  04/22/2004  Job:  [id number]   cc:   Evelene Croon, M.D.  [address]  [place]  [place] [address]  Fax: [phone]   Charlton Haws, M.D.

## 2011-05-09 ENCOUNTER — Other Ambulatory Visit: Payer: Self-pay | Admitting: Cardiovascular Disease

## 2011-05-12 ENCOUNTER — Encounter (INDEPENDENT_AMBULATORY_CARE_PROVIDER_SITE_OTHER): Payer: Self-pay | Admitting: General Surgery

## 2011-05-19 ENCOUNTER — Encounter: Payer: Self-pay | Admitting: Cardiovascular Disease

## 2011-05-31 ENCOUNTER — Ambulatory Visit (INDEPENDENT_AMBULATORY_CARE_PROVIDER_SITE_OTHER): Payer: Medicare Other | Admitting: Cardiovascular Disease

## 2011-05-31 ENCOUNTER — Encounter: Payer: Self-pay | Admitting: Cardiovascular Disease

## 2011-05-31 VITALS — BP 129/69 | HR 81 | Resp 16 | Ht 62.0 in | Wt 140.0 lb

## 2011-05-31 DIAGNOSIS — I10 Essential (primary) hypertension: Secondary | ICD-10-CM

## 2011-05-31 DIAGNOSIS — I447 Left bundle-branch block, unspecified: Secondary | ICD-10-CM

## 2011-05-31 MED ORDER — CARVEDILOL 12.5 MG PO TABS: ORAL_TABLET | ORAL | Status: DC

## 2011-05-31 NOTE — Assessment & Plan Note (Signed)
Stable F/U MRI per oncology

## 2011-05-31 NOTE — Patient Instructions (Signed)
Your physician recommends that you schedule a follow-up appointment in: 6 months  Your physician has recommended you make the following change in your medication: INCREASE COREG to 12.5 mg twice daily.

## 2011-05-31 NOTE — Assessment & Plan Note (Signed)
Chronic wit no AV block  ECG yearly

## 2011-05-31 NOTE — Assessment & Plan Note (Signed)
EF improved by last echo.  Functional class one  Consider echo in 2 years

## 2011-05-31 NOTE — Assessment & Plan Note (Signed)
Well controlled.  Continue current medications and low sodium Dash type diet.  Decrease coreg dose

## 2011-05-31 NOTE — Progress Notes (Signed)
[name] is seen today for F/U of non-ischemic DCM EF around 50%, abnormal ECG with ICLBBB and HTN.  When I last saw her I cleared her for Breast Reconstructive Surgery which went well without cardiac complications.  She has yearly MRI's and no return of cancer She had a normal myovue 02/11/2009 with EF improved to 74% By echo on 04/22/2008 wther was mild LVE with EF 50-55%. She's had a previous cath I believe in 2005 with no CAD. She is not having any SOB, palpitations, PND, orthopnea or edema. She is walking 2 miles/day. She is in excellent spirits. Her husband finally got his kidney transplant  Unfortunatley he got a fistula after a biopsy and has a Cr of 2.7.  Patient wants to cut her coreg back ot 1 tab bid and I think this would be fine  ROS: Denies fever, malais, weight loss, blurry vision, decreased visual acuity, cough, sputum, SOB, hemoptysis, pleuritic pain, palpitaitons, heartburn, abdominal pain, melena, lower extremity edema, claudication, or rash.  All other systems reviewed and negative  General: Affect appropriate Healthy:  appears stated age HEENT: normal Neck supple with no adenopathy JVP normal no bruits no thyromegaly Lungs clear with no wheezing and good diaphragmatic motion Heart:  S1/S2 no murmur,rub, gallop or click PMI normal Abdomen: benighn, BS positve, no tenderness, no AAA no bruit.  No HSM or HJR Distal pulses intact with no bruits No edema Neuro non-focal Skin warm and dry No muscular weakness S/P mastectomy with reconstructive surgery.   Current Outpatient Prescriptions  Medication Sig Dispense Refill  . calcium carbonate (OS-CAL) 600 MG TABS Take 600 mg by mouth 2 (two) times daily with a meal.        . carvedilol (COREG) 12.5 MG tablet TAKE 1 & 1/2 TABLETS BY MOUTH TWICE A DAY  90 tablet  2  . Cholecalciferol (VITAMIN D3) 5000 UNITS CAPS Take by mouth daily.        Marland Kitchen levothyroxine (SYNTHROID, LEVOTHROID) 112 MCG tablet Take 112 mcg by mouth daily.        Marland Kitchen  losartan (COZAAR) 50 MG tablet Take 50 mg by mouth daily.        . magnesium oxide (MAG-OX 400) 400 MG tablet Take 400 mg by mouth 2 (two) times daily.        . Multiple Vitamins-Minerals (MULTIVITAL) tablet Take 1 tablet by mouth 2 (two) times daily.        . NON FORMULARY Osteodenz  1 po bid       . NON FORMULARY Vegetarian omega green  3 po daily       . DISCONTD: Multiple Vitamins-Minerals (OSTEO COMPLEX) CAPS Take by mouth.          Allergies  Benazepril; Epinephrine; Iohexol; and Lisinopril  Electrocardiogram:  Assessment and Plan

## 2011-06-06 ENCOUNTER — Telehealth: Payer: Self-pay | Admitting: Cardiovascular Disease

## 2011-06-06 DIAGNOSIS — I10 Essential (primary) hypertension: Secondary | ICD-10-CM

## 2011-06-06 MED ORDER — CARVEDILOL 12.5 MG PO TABS: ORAL_TABLET | ORAL | Status: DC

## 2011-06-06 NOTE — Telephone Encounter (Signed)
Pt started fluttering again yesterday.  She increased her coreg back to 18.75mg  bid  And the fluttering has stopped. Mylo Red RN

## 2011-06-06 NOTE — Telephone Encounter (Signed)
C/o heart flutter on yesterdayb/c of low dosage. Pt states she doing fine now.  Need the original dosage of coreg call back in . [facility] [address] [phone].

## 2011-06-29 ENCOUNTER — Other Ambulatory Visit: Payer: Self-pay | Admitting: *Deleted

## 2011-06-29 MED ORDER — LOSARTAN POTASSIUM 50 MG PO TABS: 50.0000 mg | ORAL_TABLET | Freq: Every day | ORAL | Status: DC

## 2011-07-20 ENCOUNTER — Other Ambulatory Visit: Payer: Self-pay | Admitting: *Deleted

## 2011-07-20 MED ORDER — LOSARTAN POTASSIUM 50 MG PO TABS: 50.0000 mg | ORAL_TABLET | Freq: Every day | ORAL | Status: DC

## 2011-09-12 LAB — DIFFERENTIAL
Basophils Absolute: 0.1
Eosinophils Absolute: 0.4
Lymphocytes Relative: 22
Lymphs Abs: 2
Neutrophils Relative %: 63

## 2011-09-12 LAB — URINALYSIS, ROUTINE W REFLEX MICROSCOPIC
Glucose, UA: NEGATIVE
Ketones, ur: NEGATIVE
Nitrite: NEGATIVE
Protein, ur: NEGATIVE
pH: 6.5

## 2011-09-12 LAB — CBC
HCT: 38.7
HCT: 40.3
Hemoglobin: 12.9
Hemoglobin: 13.4
MCHC: 33.4
MCHC: 33.2
MCV: 93.9
Platelets: 303
RBC: 4.12
RDW: 14
RDW: 13.9

## 2011-09-12 LAB — COMPREHENSIVE METABOLIC PANEL
BUN: 17
CO2: 32
Calcium: 9.7
GFR calc non Af Amer: >60
Glucose, Bld: 99
Total Protein: 6.5

## 2011-09-12 LAB — URINE MICROSCOPIC-ADD ON

## 2011-09-22 ENCOUNTER — Other Ambulatory Visit: Payer: Self-pay | Admitting: Obstetrics and Gynecology

## 2011-11-25 ENCOUNTER — Ambulatory Visit (INDEPENDENT_AMBULATORY_CARE_PROVIDER_SITE_OTHER): Payer: Medicare Other | Admitting: General Surgery

## 2011-11-25 ENCOUNTER — Encounter (INDEPENDENT_AMBULATORY_CARE_PROVIDER_SITE_OTHER): Payer: Self-pay | Admitting: General Surgery

## 2011-11-25 DIAGNOSIS — C50919 Malignant neoplasm of unspecified site of unspecified female breast: Secondary | ICD-10-CM | POA: Insufficient documentation

## 2011-11-25 NOTE — Progress Notes (Signed)
History: Patient returns for long-term followup status post right total mastectomy for a T1 DN0 invasive and in situ carcinoma the right breast. She had implant reconstruction. She had no adjuvant treatment she was triple negative. She reports no problems since her last visit. Specifically no chest wall or breast masses, unusual pain, or skin changes. She had a negative mammogram in January of this year.  Exam: General: Well-developed in no distress Skin: Warm and dry without rash infection Lymph nodes: No cervical, supravesicular, or axillary nodes palpable Lungs: Clear without increased work of breathing Breasts: Reconstruction on the right with no chest wall masses in axilla is negative. Left breast without masses skin changes or nipple changes.  Assessment and plan: Approaching 4 years following mastectomy for T1 B. N0 triple negative cancer of the right breast with no clinical evidence of recurrence. She is to have an MRI next month. She will return in one year.

## 2011-11-28 ENCOUNTER — Encounter: Payer: Self-pay | Admitting: Cardiovascular Disease

## 2011-11-28 ENCOUNTER — Ambulatory Visit (INDEPENDENT_AMBULATORY_CARE_PROVIDER_SITE_OTHER): Payer: Medicare Other | Admitting: Cardiovascular Disease

## 2011-11-28 ENCOUNTER — Encounter (INDEPENDENT_AMBULATORY_CARE_PROVIDER_SITE_OTHER): Payer: Medicare Other | Admitting: *Deleted

## 2011-11-28 VITALS — BP 134/76 | HR 75 | Ht 62.0 in | Wt 144.0 lb

## 2011-11-28 DIAGNOSIS — I5032 Chronic diastolic (congestive) heart failure: Secondary | ICD-10-CM

## 2011-11-28 DIAGNOSIS — D0590 Unspecified type of carcinoma in situ of unspecified breast: Secondary | ICD-10-CM

## 2011-11-28 DIAGNOSIS — R0989 Other specified symptoms and signs involving the circulatory and respiratory systems: Secondary | ICD-10-CM

## 2011-11-28 DIAGNOSIS — I447 Left bundle-branch block, unspecified: Secondary | ICD-10-CM

## 2011-11-28 DIAGNOSIS — I509 Heart failure, unspecified: Secondary | ICD-10-CM

## 2011-11-28 DIAGNOSIS — I10 Essential (primary) hypertension: Secondary | ICD-10-CM

## 2011-11-28 HISTORY — DX: Chronic diastolic (congestive) heart failure: I50.32

## 2011-11-28 NOTE — Assessment & Plan Note (Signed)
Stable with MRI next month  Seen by [facility] and no problems with reconstruction

## 2011-11-28 NOTE — Assessment & Plan Note (Signed)
Well controlled.  Continue current medications and low sodium Dash type diet.   Tried to lower coreg and had more palpitations

## 2011-11-28 NOTE — Assessment & Plan Note (Signed)
Stable no evidence of high grade heart block  

## 2011-11-28 NOTE — Progress Notes (Signed)
[name] is seen today for F/U of non-ischemic DCM EF around 50%, abnormal ECG with ICLBBB and HTN.  When I last saw her I cleared her for Breast Reconstructive Surgery which went well without cardiac complications. She has yearly MRI's and no return of cancer She had a normal myovue 02/11/2009 with EF improved to 74% By echo on 04/22/2008 wther was mild LVE with EF 50-55%. She's had a previous cath I believe in 2005 with no CAD. She is not having any SOB, palpitations, PND, orthopnea or edema. She is walking 2 miles/day. She is in excellent spirits. Her husband finally got his kidney transplant Unfortunatley he got a fistula after a biopsy and has a Cr of 2.7.    ROS: Denies fever, malais, weight loss, blurry vision, decreased visual acuity, cough, sputum, SOB, hemoptysis, pleuritic pain, palpitaitons, heartburn, abdominal pain, melena, lower extremity edema, claudication, or rash.  All other systems reviewed and negative  General: Affect appropriate Healthy:  appears stated age HEENT: normal Neck supple with no adenopathy JVP normal left bruit no thyromegaly Lungs clear with no wheezing and good diaphragmatic motion Heart:  S1/S2 no murmur,rub, gallop or click PMI normal Abdomen: benighn, BS positve, no tenderness, no AAA no bruit.  No HSM or HJR Distal pulses intact with no bruits No edema Neuro non-focal Skin warm and dry No muscular weakness   Current Outpatient Prescriptions  Medication Sig Dispense Refill  . calcium carbonate (OS-CAL) 600 MG TABS Take 600 mg by mouth 2 (two) times daily with a meal.        . carvedilol (COREG) 12.5 MG tablet Take one and 1/2  tablets twice daily.  90 tablet  6  . Cholecalciferol (VITAMIN D3) 5000 UNITS CAPS Take by mouth daily.        Marland Kitchen levothyroxine (SYNTHROID, LEVOTHROID) 112 MCG tablet Take 112 mcg by mouth daily.        Marland Kitchen losartan (COZAAR) 50 MG tablet Take 1 tablet (50 mg total) by mouth daily.  30 tablet  12  . magnesium oxide (MAG-OX 400) 400  MG tablet Take 400 mg by mouth 2 (two) times daily.        . Multiple Vitamins-Minerals (MULTIVITAL) tablet Take 1 tablet by mouth 2 (two) times daily.        . NON FORMULARY Osteodenz  1 po bid       . NON FORMULARY Vegetarian omega green  3 po daily         Allergies  Benazepril; Epinephrine; and Iohexol  Electrocardiogram:   NSR 75 LAD IVCD poor R wave progression  Assessment and Plan

## 2011-11-28 NOTE — Assessment & Plan Note (Signed)
History of DCM with improved EF  Continue beta blocker and ACE.  Consdier F/U echo/MRI if symptoms return.  2009 EF 50-55%

## 2011-11-28 NOTE — Patient Instructions (Signed)
Your physician wants you to follow-up in: YEAR WITH DR Haywood Filler will receive a reminder letter in the mail two months in advance. If you don't receive a letter, please call our office to schedule the follow-up appointment. Your physician recommends that you continue on your current medications as directed. Please refer to the Current Medication list given to you today. Your physician has requested that you have a carotid duplex. This test is an ultrasound of the carotid arteries in your neck. It looks at blood flow through these arteries that supply the brain with blood. Allow one hour for this exam. There are no restrictions or special instructions. TODAY AT 11:00 AM DX BRUIT

## 2011-11-28 NOTE — Assessment & Plan Note (Signed)
Left carotid bruit.  F/U duplex today  ASA

## 2011-11-29 NOTE — Progress Notes (Signed)
Addended by: Kem Parkinson on: 11/29/2011 04:53 PM   Modules accepted: Orders

## 2011-11-30 ENCOUNTER — Telehealth: Payer: Self-pay | Admitting: Cardiovascular Disease

## 2011-11-30 NOTE — Telephone Encounter (Signed)
New message:  Pt had test done on Monday and would like the results called to her.

## 2011-12-01 NOTE — Telephone Encounter (Signed)
PT AWARE  OF CAROTID RESULTS   0-39% R  60-79% ON L RECHECK IN 6 MONTHS ./CY

## 2012-01-10 DIAGNOSIS — L600 Ingrowing nail: Secondary | ICD-10-CM | POA: Diagnosis not present

## 2012-01-10 DIAGNOSIS — B351 Tinea unguium: Secondary | ICD-10-CM | POA: Diagnosis not present

## 2012-01-23 DIAGNOSIS — E039 Hypothyroidism, unspecified: Secondary | ICD-10-CM | POA: Diagnosis not present

## 2012-01-30 DIAGNOSIS — J029 Acute pharyngitis, unspecified: Secondary | ICD-10-CM | POA: Diagnosis not present

## 2012-02-16 DIAGNOSIS — Z1289 Encounter for screening for malignant neoplasm of other sites: Secondary | ICD-10-CM | POA: Diagnosis not present

## 2012-02-16 DIAGNOSIS — Z1231 Encounter for screening mammogram for malignant neoplasm of breast: Secondary | ICD-10-CM | POA: Diagnosis not present

## 2012-02-16 DIAGNOSIS — Z1212 Encounter for screening for malignant neoplasm of rectum: Secondary | ICD-10-CM | POA: Diagnosis not present

## 2012-02-17 ENCOUNTER — Other Ambulatory Visit (HOSPITAL_BASED_OUTPATIENT_CLINIC_OR_DEPARTMENT_OTHER): Payer: Medicare Other | Admitting: Lab

## 2012-02-17 DIAGNOSIS — C50519 Malignant neoplasm of lower-outer quadrant of unspecified female breast: Secondary | ICD-10-CM

## 2012-02-17 DIAGNOSIS — Z171 Estrogen receptor negative status [ER-]: Secondary | ICD-10-CM | POA: Diagnosis not present

## 2012-02-17 LAB — CBC WITH DIFFERENTIAL/PLATELET
BASO%: 1.1 % (ref 0.0–2.0)
Eosinophils Absolute: 0.3 10*3/uL (ref 0.0–0.5)
HCT: 39.3 % (ref 34.8–46.6)
LYMPH%: 17.4 % (ref 14.0–49.7)
MCHC: 32.6 g/dL (ref 31.5–36.0)
MONO#: 0.6 10*3/uL (ref 0.1–0.9)
NEUT#: 4.6 10*3/uL (ref 1.5–6.5)
Platelets: 305 10*3/uL (ref 145–400)
RBC: 4.12 10*6/uL (ref 3.70–5.45)
WBC: 6.7 10*3/uL (ref 3.9–10.3)
lymph#: 1.2 10*3/uL (ref 0.9–3.3)

## 2012-02-18 LAB — COMPREHENSIVE METABOLIC PANEL
ALT: 12 U/L (ref 0–35)
AST: 14 U/L (ref 0–37)
Albumin: 3.8 g/dL (ref 3.5–5.2)
CO2: 25 mEq/L (ref 19–32)
Calcium: 9.1 mg/dL (ref 8.4–10.5)
Chloride: 104 mEq/L (ref 96–112)
Creatinine, Ser: 0.72 mg/dL (ref 0.50–1.10)
Potassium: 4.3 mEq/L (ref 3.5–5.3)
Total Protein: 6 g/dL (ref 6.0–8.3)

## 2012-02-18 LAB — CANCER ANTIGEN 27.29: CA 27.29: 32 U/mL (ref 0–39)

## 2012-02-18 LAB — LACTATE DEHYDROGENASE: LDH: 124 U/L (ref 94–250)

## 2012-02-19 ENCOUNTER — Encounter: Payer: Self-pay | Admitting: Oncology

## 2012-02-24 ENCOUNTER — Ambulatory Visit (HOSPITAL_BASED_OUTPATIENT_CLINIC_OR_DEPARTMENT_OTHER): Payer: Medicare Other | Admitting: Physician Assistant

## 2012-02-24 ENCOUNTER — Telehealth: Payer: Self-pay | Admitting: Oncology

## 2012-02-24 ENCOUNTER — Encounter: Payer: Self-pay | Admitting: Physician Assistant

## 2012-02-24 VITALS — BP 105/63 | HR 77 | Temp 99.3°F | Ht 62.0 in | Wt 140.8 lb

## 2012-02-24 DIAGNOSIS — E559 Vitamin D deficiency, unspecified: Secondary | ICD-10-CM

## 2012-02-24 DIAGNOSIS — Z853 Personal history of malignant neoplasm of breast: Secondary | ICD-10-CM

## 2012-02-24 DIAGNOSIS — C50919 Malignant neoplasm of unspecified site of unspecified female breast: Secondary | ICD-10-CM

## 2012-02-24 DIAGNOSIS — D0590 Unspecified type of carcinoma in situ of unspecified breast: Secondary | ICD-10-CM

## 2012-02-24 DIAGNOSIS — Z9010 Acquired absence of unspecified breast and nipple: Secondary | ICD-10-CM

## 2012-02-24 NOTE — Telephone Encounter (Signed)
gv pt appt schedule for feb/march 2013 and breast mri for 03/07/12.

## 2012-02-29 NOTE — Progress Notes (Signed)
No images are attached to the encounter. No scans are attached to the encounter. No scans are attached to the encounter. [facility] OFFICE PROGRESS NOTE  Thora Lance, MD, MD [address] [place] [address]  DIAGNOSIS:  1. T1bN0, triple negative breast cancer 2. History of cardiomyopathy secondary to radiation received for Hodgkin's disease many years ago.  PRIOR THERAPY: Status post mastectomy 04/29/2008, status post reconstruction with implant  CURRENT THERAPY: Observation  INTERVAL HISTORY: [name] 66 y.o. female returns for a scheduled regular yearly visit for followup of her history of triple-negative breast cancer. Overall she's been relatively well. She did however have episode of chest pain in the summer that required evaluation in the emergency room. She empirically took for aspirin and called 911. She described it is a short stabbing pain. She states her cardiac enzymes were negative and she's had no subsequent episodes. Her primary care physician, Dr. Manus Gunning, states that she has a left carotid bruit, is 60-79% occluded. She is now on a low cholesterol diet. Should the occlusion increased to 80% or more she you will most likely proceed to an endarterectomy she reports she had her mammogram at Dr. Sherrye Payor office about a week ago the results are still pending. The specific data the mammograms 02/16/2012. She also reports she had a bone density evaluation last year.   MEDICAL HISTORY: Past Medical History  Diagnosis Date  . Atrial fibrillation   . Other left bundle branch block   . Secondary cardiomyopathy, unspecified   . Unspecified essential hypertension   . Unspecified hypothyroidism   . Carcinoma in situ of breast   . Raynaud's disease   . Hodgkin's disease     stage 2b  . Bundle branch block     left    ALLERGIES:  is allergic to benazepril; epinephrine; and iohexol.  MEDICATIONS:  Current Outpatient Prescriptions    Medication Sig Dispense Refill  . calcium carbonate (OS-CAL) 600 MG TABS Take 600 mg by mouth 2 (two) times daily with a meal.        . carvedilol (COREG) 12.5 MG tablet Take one and 1/2  tablets twice daily.  90 tablet  6  . Cholecalciferol (VITAMIN D3) 5000 UNITS CAPS Take by mouth daily.        Marland Kitchen levothyroxine (SYNTHROID, LEVOTHROID) 112 MCG tablet Take 125 mcg by mouth daily.       Marland Kitchen losartan (COZAAR) 50 MG tablet Take 1 tablet (50 mg total) by mouth daily.  30 tablet  12  . magnesium oxide (MAG-OX 400) 400 MG tablet Take 400 mg by mouth 2 (two) times daily.        . Multiple Vitamins-Minerals (MULTIVITAL) tablet Take 1 tablet by mouth 2 (two) times daily.        . NON FORMULARY Osteodenz  1 po bid       . NON FORMULARY Vegetarian omega green  3 po daily         SURGICAL HISTORY:  Past Surgical History  Procedure Date  . Tonsillectomy and adenoidectomy 1965  . Thoractomy 1968  . Btl 1971  . R vein ligation & stripping 1976  . Spleenectomy 1988    for Hodgkin's disease  . Breast lumpectomy 2009    right, lymph node biopsy  . Mastectomy 2009    right breast  . Polypectomy 1990    D&C (also in 1977 and 1991)   . Dilation and curettage of uterus   .  Tissue expander placement     right breast  . Tissue expander  removal w/ replacement of implant     REVIEW OF SYSTEMS:  Pertinent items are noted in HPI.   PHYSICAL EXAMINATION: General appearance: alert, cooperative, appears stated age and no distress Head: Normocephalic, without obvious abnormality, atraumatic Neck: no adenopathy, no JVD, supple, symmetrical, trachea midline, thyroid not enlarged, symmetric, no tenderness/mass/nodules and left carotid bruit Lymph nodes: Cervical, supraclavicular, and axillary nodes normal. Resp: clear to auscultation bilaterally Cardio: regular rate and rhythm, S1, S2 normal, no murmur, click, rub or gallop GI: soft, non-tender; bowel sounds normal; no masses,  no  organomegaly Extremities: extremities normal, atraumatic, no cyanosis or edema Neurologic: Alert and oriented X 3, normal strength and tone. Normal symmetric reflexes. Normal coordination and gait Breast: Right breast is status post implant, there remains some surgical thickening in the outer aspect slightly above the surgical incision. Nothing palpable in the right axilla left breast is without skin changes of concern no masses, nipple discharge and nothing palpable in the left inguinal  ECOG PERFORMANCE STATUS: 0 - Asymptomatic  Blood pressure 105/63, pulse 77, temperature 99.3 F (37.4 C), temperature source Oral, height 5\' 2"  (1.575 m), weight 140 lb 12.8 oz (63.866 kg).  LABORATORY DATA: Lab Results  Component Value Date   WBC 6.7 02/17/2012   HGB 12.8 02/17/2012   HCT 39.3 02/17/2012   MCV 95.5 02/17/2012   PLT 305 02/17/2012      Chemistry      Component Value Date/Time   NA 141 02/17/2012 0806   K 4.3 02/17/2012 0806   CL 104 02/17/2012 0806   CO2 25 02/17/2012 0806   BUN 20 02/17/2012 0806   CREATININE 0.72 02/17/2012 0806      Component Value Date/Time   CALCIUM 9.1 02/17/2012 0806   ALKPHOS 105 02/17/2012 0806   AST 14 02/17/2012 0806   ALT 12 02/17/2012 0806   BILITOT 0.4 02/17/2012 0806       RADIOGRAPHIC STUDIES:  No results found.   ASSESSMENT/PLAN: The patient is a very pleasant 66 year old white female with a history of T1 BN0, triple-negative breast cancer, status post mastectomy 04/29/2008. The results of her most recent mammogram from 02/16/2012 are still pending. The patient was discussed with Dr. Clide Dales. We will arrange for her to have bilateral breast MRI done for further followup of her disease. She is to continue her low-cholesterol diet and followup with Dr. Sharlett Iles is scheduled. We'll plan to see her in one year with a repeat CBC differential, C. met and vitamin D level.     Laural Benes, Cherolyn Behrle E, PA-C     All questions were answered. The patient knows to call the clinic  with any problems, questions or concerns. We can certainly see the patient much sooner if necessary.

## 2012-03-07 ENCOUNTER — Other Ambulatory Visit: Payer: Self-pay | Admitting: Physician Assistant

## 2012-03-07 ENCOUNTER — Ambulatory Visit (HOSPITAL_COMMUNITY)
Admission: RE | Admit: 2012-03-07 | Discharge: 2012-03-07 | Disposition: A | Discharge status: Home / Self Care | Payer: Medicare Other | Source: Ambulatory Visit | Attending: Physician Assistant | Admitting: Physician Assistant

## 2012-03-07 DIAGNOSIS — Z9010 Acquired absence of unspecified breast and nipple: Secondary | ICD-10-CM | POA: Diagnosis not present

## 2012-03-07 DIAGNOSIS — R928 Other abnormal and inconclusive findings on diagnostic imaging of breast: Secondary | ICD-10-CM

## 2012-03-07 DIAGNOSIS — D0590 Unspecified type of carcinoma in situ of unspecified breast: Secondary | ICD-10-CM

## 2012-03-07 DIAGNOSIS — Z853 Personal history of malignant neoplasm of breast: Secondary | ICD-10-CM | POA: Diagnosis not present

## 2012-03-07 DIAGNOSIS — C50919 Malignant neoplasm of unspecified site of unspecified female breast: Secondary | ICD-10-CM

## 2012-03-07 MED ORDER — GADOBENATE DIMEGLUMINE 529 MG/ML IV SOLN
12.0000 mL | Freq: Once | INTRAVENOUS | Status: AC | PRN
  Administered 2012-03-07: 12 mL via INTRAVENOUS

## 2012-03-07 MED ORDER — GADOBENATE DIMEGLUMINE 529 MG/ML IV SOLN: 15.0000 mL | Freq: Once | INTRAVENOUS | Status: DC | PRN

## 2012-03-08 ENCOUNTER — Ambulatory Visit
Admission: RE | Admit: 2012-03-08 | Discharge: 2012-03-08 | Disposition: A | Discharge status: Home / Self Care | Payer: Medicare Other | Source: Ambulatory Visit | Attending: Physician Assistant | Admitting: Physician Assistant

## 2012-03-08 ENCOUNTER — Other Ambulatory Visit: Payer: Self-pay | Admitting: Physician Assistant

## 2012-03-08 DIAGNOSIS — N63 Unspecified lump in unspecified breast: Secondary | ICD-10-CM

## 2012-03-08 DIAGNOSIS — R928 Other abnormal and inconclusive findings on diagnostic imaging of breast: Secondary | ICD-10-CM

## 2012-03-08 DIAGNOSIS — Z853 Personal history of malignant neoplasm of breast: Secondary | ICD-10-CM | POA: Diagnosis not present

## 2012-03-13 ENCOUNTER — Other Ambulatory Visit: Payer: Medicare Other

## 2012-03-21 ENCOUNTER — Other Ambulatory Visit: Payer: Self-pay | Admitting: Cardiovascular Disease

## 2012-03-21 ENCOUNTER — Telehealth (INDEPENDENT_AMBULATORY_CARE_PROVIDER_SITE_OTHER): Payer: Self-pay | Admitting: General Surgery

## 2012-03-21 DIAGNOSIS — I10 Essential (primary) hypertension: Secondary | ICD-10-CM

## 2012-03-21 MED ORDER — CARVEDILOL 12.5 MG PO TABS: ORAL_TABLET | ORAL | Status: DC

## 2012-03-21 NOTE — Telephone Encounter (Signed)
Refill -Carvedilol (COREG) 12.5 MG tablet   Verified pharmacy [facility].  Patient can be reached at hm# [phone] for additional information.

## 2012-03-21 NOTE — Telephone Encounter (Signed)
Return patient call-- Made [name] aware that we have imaging for her upcoming appointment with Dr. Johna Sheriff.

## 2012-03-23 ENCOUNTER — Encounter (INDEPENDENT_AMBULATORY_CARE_PROVIDER_SITE_OTHER): Payer: Self-pay

## 2012-03-23 ENCOUNTER — Encounter (HOSPITAL_BASED_OUTPATIENT_CLINIC_OR_DEPARTMENT_OTHER): Payer: Self-pay | Admitting: *Deleted

## 2012-03-23 ENCOUNTER — Ambulatory Visit (INDEPENDENT_AMBULATORY_CARE_PROVIDER_SITE_OTHER): Payer: Medicare Other | Admitting: General Surgery

## 2012-03-23 ENCOUNTER — Encounter (INDEPENDENT_AMBULATORY_CARE_PROVIDER_SITE_OTHER): Payer: Self-pay | Admitting: General Surgery

## 2012-03-23 VITALS — BP 132/77 | HR 74 | Temp 98.4°F | Ht 62.0 in | Wt 137.4 lb

## 2012-03-23 DIAGNOSIS — N63 Unspecified lump in unspecified breast: Secondary | ICD-10-CM

## 2012-03-23 DIAGNOSIS — N6310 Unspecified lump in the right breast, unspecified quadrant: Secondary | ICD-10-CM

## 2012-03-23 DIAGNOSIS — C50919 Malignant neoplasm of unspecified site of unspecified female breast: Secondary | ICD-10-CM | POA: Diagnosis not present

## 2012-03-23 NOTE — Progress Notes (Signed)
Subjective:   Right breast/chest wall mass  Patient ID: [name], female   DOB: 12/22/1945, 66 y.o.   MRN: [id number]  HPI Patient returns to the office. She has a history of right total mastectomy for invasive and in situ carcinoma of the right breast with a 6 mm invasive component, triple negative. She underwent right total mastectomy due to previous history of radiation for Hodgkin's disease and inability to take further radiation. Sentinel lymph node biopsy was also negative. This was in 2009 and she has been followed with no evidence of disease since. She had declined adjuvant treatment at that time. Recent bilateral breast MRI was performed which revealed an 8 mm nodular enhancement superficially at the 10:00 position over her subpectoral implant on the right side. Ultrasound was performed which showed a somewhat suspicious appearing solid density. Initially a needle biopsy was planned but there was expressed some significant risk of implant rupture and the patient would prefer to have this excised. She feels that it has been there for some time but not sure exactly how long it has been growing.  Past Medical History  Diagnosis Date  . Atrial fibrillation   . Other left bundle branch block   . Secondary cardiomyopathy, unspecified   . Unspecified essential hypertension   . Unspecified hypothyroidism   . Carcinoma in situ of breast   . Raynaud's disease   . Hodgkin's disease     stage 2b  . Bundle branch block     left   Past Surgical History  Procedure Date  . Tonsillectomy and adenoidectomy 1965  . Thoractomy 1968  . Btl 1971  . R vein ligation & stripping 1976  . Spleenectomy 1988    for Hodgkin's disease  . Breast lumpectomy 2009    right, lymph node biopsy  . Mastectomy 2009    right breast  . Polypectomy 1990    D&C (also in 1977 and 1991)   . Dilation and curettage of uterus   . Tissue expander placement     right breast  . Tissue expander  removal w/  replacement of implant    Current Outpatient Prescriptions  Medication Sig Dispense Refill  . calcium carbonate (OS-CAL) 600 MG TABS Take 600 mg by mouth 2 (two) times daily with a meal.        . carvedilol (COREG) 12.5 MG tablet Take one and 1/2  tablets twice daily.  90 tablet  6  . Cholecalciferol (VITAMIN D3) 5000 UNITS CAPS Take by mouth daily.        . levothyroxine (SYNTHROID, LEVOTHROID) 112 MCG tablet Take 125 mcg by mouth daily.       . losartan (COZAAR) 50 MG tablet Take 1 tablet (50 mg total) by mouth daily.  30 tablet  12  . magnesium oxide (MAG-OX 400) 400 MG tablet Take 400 mg by mouth 2 (two) times daily.        . Multiple Vitamins-Minerals (MULTIVITAL) tablet Take 1 tablet by mouth 2 (two) times daily.        . NON FORMULARY Osteodenz  1 po bid       . NON FORMULARY Vegetarian omega green  3 po daily        Allergies  Allergen Reactions  . Benazepril     cough  . Epinephrine     REACTION: BUZZ  . Iohexol      Desc: SNEEZING    History  Substance Use Topics  . Smoking status: Never   Smoker   . Smokeless tobacco: Not on file  . Alcohol Use: No     Review of Systems  Respiratory: Negative.   Cardiovascular: Negative.        Objective:   Physical Exam General: Thin healthy-appearing Caucasian female Skin: No rash or infection HEENT: No palpable masses or thyromegaly Lymph nodes: No palpable cervical, supraclavicular or axillary lymph nodes Breasts: Status post reconstruction on the right. At about the 10:00 position superficially just beneath the skin is a firm rounded freely movable approximately 6 or 7 mm mass. No other skin changes or masses. Left breast negative to exam. Lungs: Clear Cardiac: No edema. Regular rate and rhythm.    Assessment:     New right chest wall mass with history of right breast cancer status post total mastectomy. I recommended proceeding with excisional biopsy. We will plan to get a frozen section at the time of excision and  if positive do a little more extensive resection for negative margins. This was discussed and she is in agreement to    Plan:     Excision right chest wall mass under general anesthesia as an outpatient.      

## 2012-03-23 NOTE — Progress Notes (Signed)
Pt would like for [place] to know she gets rt nose bleeds easily

## 2012-03-23 NOTE — Progress Notes (Signed)
To come in for bmet Said dr Johna Sheriff to call dr Eden Emms to get clearance for surgery-she got clearance for reconstruction and did well according to his notes.

## 2012-03-26 ENCOUNTER — Encounter (HOSPITAL_BASED_OUTPATIENT_CLINIC_OR_DEPARTMENT_OTHER)
Admission: RE | Admit: 2012-03-26 | Discharge: 2012-03-26 | Disposition: A | Discharge status: Home / Self Care | Payer: Medicare Other | Source: Ambulatory Visit | Attending: General Surgery | Admitting: General Surgery

## 2012-03-26 DIAGNOSIS — I4891 Unspecified atrial fibrillation: Secondary | ICD-10-CM | POA: Diagnosis not present

## 2012-03-26 DIAGNOSIS — Z9010 Acquired absence of unspecified breast and nipple: Secondary | ICD-10-CM | POA: Diagnosis not present

## 2012-03-26 DIAGNOSIS — C792 Secondary malignant neoplasm of skin: Secondary | ICD-10-CM | POA: Diagnosis not present

## 2012-03-26 DIAGNOSIS — Z8571 Personal history of Hodgkin lymphoma: Secondary | ICD-10-CM | POA: Diagnosis not present

## 2012-03-26 DIAGNOSIS — I10 Essential (primary) hypertension: Secondary | ICD-10-CM | POA: Diagnosis not present

## 2012-03-26 DIAGNOSIS — Z853 Personal history of malignant neoplasm of breast: Secondary | ICD-10-CM | POA: Diagnosis not present

## 2012-03-26 DIAGNOSIS — E039 Hypothyroidism, unspecified: Secondary | ICD-10-CM | POA: Diagnosis not present

## 2012-03-26 DIAGNOSIS — I498 Other specified cardiac arrhythmias: Secondary | ICD-10-CM | POA: Diagnosis not present

## 2012-03-26 LAB — BASIC METABOLIC PANEL WITH GFR
BUN: 26 mg/dL — ABNORMAL HIGH (ref 6–23)
CO2: 31 meq/L (ref 19–32)
Calcium: 9.2 mg/dL (ref 8.4–10.5)
Chloride: 101 meq/L (ref 96–112)
Creatinine, Ser: 0.88 mg/dL (ref 0.50–1.10)
GFR calc Af Amer: 78 mL/min — ABNORMAL LOW
GFR calc non Af Amer: 67 mL/min — ABNORMAL LOW
Glucose, Bld: 72 mg/dL (ref 70–99)
Potassium: 5.3 meq/L — ABNORMAL HIGH (ref 3.5–5.1)
Sodium: 141 meq/L (ref 135–145)

## 2012-03-27 ENCOUNTER — Encounter: Payer: Self-pay | Admitting: *Deleted

## 2012-03-28 ENCOUNTER — Encounter (HOSPITAL_BASED_OUTPATIENT_CLINIC_OR_DEPARTMENT_OTHER): Payer: Self-pay | Admitting: Certified Registered"

## 2012-03-28 ENCOUNTER — Ambulatory Visit (HOSPITAL_BASED_OUTPATIENT_CLINIC_OR_DEPARTMENT_OTHER): Payer: Medicare Other | Admitting: Certified Registered"

## 2012-03-28 ENCOUNTER — Ambulatory Visit (HOSPITAL_BASED_OUTPATIENT_CLINIC_OR_DEPARTMENT_OTHER)
Admission: RE | Admit: 2012-03-28 | Discharge: 2012-03-28 | Disposition: A | Discharge status: Home / Self Care | Payer: Medicare Other | Source: Ambulatory Visit | Attending: General Surgery | Admitting: General Surgery

## 2012-03-28 ENCOUNTER — Encounter (HOSPITAL_BASED_OUTPATIENT_CLINIC_OR_DEPARTMENT_OTHER)
Admission: RE | Disposition: A | Discharge status: Home / Self Care | Payer: Self-pay | Source: Ambulatory Visit | Attending: General Surgery

## 2012-03-28 ENCOUNTER — Encounter (HOSPITAL_BASED_OUTPATIENT_CLINIC_OR_DEPARTMENT_OTHER): Payer: Self-pay

## 2012-03-28 DIAGNOSIS — Z8571 Personal history of Hodgkin lymphoma: Secondary | ICD-10-CM | POA: Insufficient documentation

## 2012-03-28 DIAGNOSIS — I498 Other specified cardiac arrhythmias: Secondary | ICD-10-CM | POA: Diagnosis not present

## 2012-03-28 DIAGNOSIS — Z9010 Acquired absence of unspecified breast and nipple: Secondary | ICD-10-CM | POA: Insufficient documentation

## 2012-03-28 DIAGNOSIS — I4891 Unspecified atrial fibrillation: Secondary | ICD-10-CM | POA: Insufficient documentation

## 2012-03-28 DIAGNOSIS — I10 Essential (primary) hypertension: Secondary | ICD-10-CM | POA: Diagnosis not present

## 2012-03-28 DIAGNOSIS — D0590 Unspecified type of carcinoma in situ of unspecified breast: Secondary | ICD-10-CM | POA: Diagnosis not present

## 2012-03-28 DIAGNOSIS — R229 Localized swelling, mass and lump, unspecified: Secondary | ICD-10-CM | POA: Diagnosis not present

## 2012-03-28 DIAGNOSIS — E039 Hypothyroidism, unspecified: Secondary | ICD-10-CM | POA: Insufficient documentation

## 2012-03-28 DIAGNOSIS — Z853 Personal history of malignant neoplasm of breast: Secondary | ICD-10-CM | POA: Diagnosis not present

## 2012-03-28 DIAGNOSIS — C792 Secondary malignant neoplasm of skin: Secondary | ICD-10-CM | POA: Insufficient documentation

## 2012-03-28 DIAGNOSIS — N6310 Unspecified lump in the right breast, unspecified quadrant: Secondary | ICD-10-CM

## 2012-03-28 DIAGNOSIS — C7981 Secondary malignant neoplasm of breast: Secondary | ICD-10-CM | POA: Diagnosis not present

## 2012-03-28 HISTORY — PX: MASS EXCISION: SHX2000

## 2012-03-28 SURGERY — EXCISION MASS
Anesthesia: General | Site: Chest | Laterality: Right | Wound class: Clean

## 2012-03-28 MED ORDER — EPHEDRINE SULFATE 50 MG/ML IJ SOLN
INTRAMUSCULAR | Status: DC | PRN
  Administered 2012-03-28: 10 mg via INTRAVENOUS

## 2012-03-28 MED ORDER — MIDAZOLAM HCL 5 MG/5ML IJ SOLN
INTRAMUSCULAR | Status: DC | PRN
  Administered 2012-03-28: 1 mg via INTRAVENOUS

## 2012-03-28 MED ORDER — ONDANSETRON HCL 4 MG/2ML IJ SOLN
INTRAMUSCULAR | Status: DC | PRN
  Administered 2012-03-28: 4 mg via INTRAVENOUS

## 2012-03-28 MED ORDER — CHLORHEXIDINE GLUCONATE 4 % EX LIQD: 1.0000 "application " | Freq: Once | CUTANEOUS | Status: DC

## 2012-03-28 MED ORDER — FENTANYL CITRATE 0.05 MG/ML IJ SOLN: 50.0000 ug | INTRAMUSCULAR | Status: DC | PRN

## 2012-03-28 MED ORDER — HYDROMORPHONE HCL PF 1 MG/ML IJ SOLN: 0.2500 mg | INTRAMUSCULAR | Status: DC | PRN

## 2012-03-28 MED ORDER — LIDOCAINE HCL (CARDIAC) 20 MG/ML IV SOLN
INTRAVENOUS | Status: DC | PRN
  Administered 2012-03-28: 60 mg via INTRAVENOUS

## 2012-03-28 MED ORDER — LORAZEPAM 2 MG/ML IJ SOLN: 1.0000 mg | Freq: Once | INTRAMUSCULAR | Status: DC | PRN

## 2012-03-28 MED ORDER — FENTANYL CITRATE 0.05 MG/ML IJ SOLN
INTRAMUSCULAR | Status: DC | PRN
  Administered 2012-03-28: 50 ug via INTRAVENOUS

## 2012-03-28 MED ORDER — CEFAZOLIN SODIUM 1-5 GM-% IV SOLN
1.0000 g | INTRAVENOUS | Status: AC
  Administered 2012-03-28: 1 g via INTRAVENOUS

## 2012-03-28 MED ORDER — DEXAMETHASONE SODIUM PHOSPHATE 4 MG/ML IJ SOLN
INTRAMUSCULAR | Status: DC | PRN
  Administered 2012-03-28: 4 mg via INTRAVENOUS

## 2012-03-28 MED ORDER — BUPIVACAINE HCL (PF) 0.5 % IJ SOLN
INTRAMUSCULAR | Status: DC | PRN
  Administered 2012-03-28: 2.5 mL

## 2012-03-28 MED ORDER — MIDAZOLAM HCL 2 MG/2ML IJ SOLN: 1.0000 mg | INTRAMUSCULAR | Status: DC | PRN

## 2012-03-28 MED ORDER — HYDROCODONE-ACETAMINOPHEN 5-325 MG PO TABS: 1.0000 | ORAL_TABLET | ORAL | Status: AC | PRN

## 2012-03-28 MED ORDER — PROPOFOL 10 MG/ML IV EMUL
INTRAVENOUS | Status: DC | PRN
  Administered 2012-03-28: 140 mg via INTRAVENOUS

## 2012-03-28 MED ORDER — LACTATED RINGERS IV SOLN
INTRAVENOUS | Status: DC
  Administered 2012-03-28: 11:00:00 via INTRAVENOUS

## 2012-03-28 SURGICAL SUPPLY — 59 items
BENZOIN TINCTURE PRP APPL 2/3 (GAUZE/BANDAGES/DRESSINGS) IMPLANT
BLADE SURG 15 STRL LF DISP TIS (BLADE) ×1 IMPLANT
BLADE SURG 15 STRL SS (BLADE) ×1
BLADE SURG ROTATE 9660 (MISCELLANEOUS) IMPLANT
CANISTER SUCTION 1200CC (MISCELLANEOUS) IMPLANT
CHLORAPREP W/TINT 26ML (MISCELLANEOUS) ×2 IMPLANT
CLEANER CAUTERY TIP 5X5 PAD (MISCELLANEOUS) ×1 IMPLANT
CLOTH BEACON ORANGE TIMEOUT ST (SAFETY) ×2 IMPLANT
COVER MAYO STAND STRL (DRAPES) ×2 IMPLANT
COVER TABLE BACK 60X90 (DRAPES) ×4 IMPLANT
DECANTER SPIKE VIAL GLASS SM (MISCELLANEOUS) IMPLANT
DERMABOND ADVANCED (GAUZE/BANDAGES/DRESSINGS)
DERMABOND ADVANCED .7 DNX12 (GAUZE/BANDAGES/DRESSINGS) IMPLANT
DRAIN CHANNEL 7F FF FLAT (WOUND CARE) IMPLANT
DRAPE PED LAPAROTOMY (DRAPES) ×2 IMPLANT
DRAPE UTILITY XL STRL (DRAPES) ×2 IMPLANT
ELECT REM PT RETURN 9FT ADLT (ELECTROSURGICAL) ×2
ELECTRODE REM PT RTRN 9FT ADLT (ELECTROSURGICAL) ×1 IMPLANT
EVACUATOR SILICONE 100CC (DRAIN) IMPLANT
GAUZE SPONGE 4X4 12PLY STRL LF (GAUZE/BANDAGES/DRESSINGS) ×2 IMPLANT
GLOVE BIO SURGEON STRL SZ 6.5 (GLOVE) ×4 IMPLANT
GLOVE BIOGEL PI IND STRL 6.5 (GLOVE) ×1 IMPLANT
GLOVE BIOGEL PI IND STRL 8 (GLOVE) ×1 IMPLANT
GLOVE BIOGEL PI INDICATOR 6.5 (GLOVE) ×1
GLOVE BIOGEL PI INDICATOR 8 (GLOVE) ×1
GLOVE SS BIOGEL STRL SZ 7.5 (GLOVE) ×1 IMPLANT
GLOVE SUPERSENSE BIOGEL SZ 7.5 (GLOVE) ×1
GOWN PREVENTION PLUS XLARGE (GOWN DISPOSABLE) ×4 IMPLANT
GOWN PREVENTION PLUS XXLARGE (GOWN DISPOSABLE) ×2 IMPLANT
NEEDLE HYPO 25X1 1.5 SAFETY (NEEDLE) ×2 IMPLANT
NEEDLE HYPO 30GX1 BEV (NEEDLE) IMPLANT
NS IRRIG 1000ML POUR BTL (IV SOLUTION) ×2 IMPLANT
PACK BASIN DAY SURGERY FS (CUSTOM PROCEDURE TRAY) ×2 IMPLANT
PAD CLEANER CAUTERY TIP 5X5 (MISCELLANEOUS) ×1
PENCIL BUTTON HOLSTER BLD 10FT (ELECTRODE) ×2 IMPLANT
SPONGE GAUZE 4X4 12PLY (GAUZE/BANDAGES/DRESSINGS) ×2 IMPLANT
STRIP CLOSURE SKIN 1/2X4 (GAUZE/BANDAGES/DRESSINGS) IMPLANT
STRIP CLOSURE SKIN 1/4X4 (GAUZE/BANDAGES/DRESSINGS) IMPLANT
SUT ETHILON 3 0 PS 1 (SUTURE) IMPLANT
SUT ETHILON 4 0 PS 2 18 (SUTURE) ×2 IMPLANT
SUT ETHILON 5 0 P 3 18 (SUTURE)
SUT ETHILON 5 0 PS 2 18 (SUTURE) IMPLANT
SUT MNCRL AB 4-0 PS2 18 (SUTURE) ×2 IMPLANT
SUT NYLON ETHILON 5-0 P-3 1X18 (SUTURE) IMPLANT
SUT SILK 2 0 SH (SUTURE) ×2 IMPLANT
SUT VIC AB 3-0 54X BRD REEL (SUTURE) IMPLANT
SUT VIC AB 3-0 BRD 54 (SUTURE)
SUT VIC AB 3-0 SH 27 (SUTURE)
SUT VIC AB 3-0 SH 27X BRD (SUTURE) IMPLANT
SUT VIC AB 4-0 SH 27 (SUTURE) ×1
SUT VIC AB 4-0 SH 27XANBCTRL (SUTURE) ×1 IMPLANT
SUT VICRYL 4-0 PS2 18IN ABS (SUTURE) IMPLANT
SYR CONTROL 10ML LL (SYRINGE) ×2 IMPLANT
TAPE CLOTH SURG 4X10 WHT LF (GAUZE/BANDAGES/DRESSINGS) ×2 IMPLANT
TOWEL OR 17X24 6PK STRL BLUE (TOWEL DISPOSABLE) ×2 IMPLANT
TOWEL OR NON WOVEN STRL DISP B (DISPOSABLE) ×2 IMPLANT
TUBE CONNECTING 20X1/4 (TUBING) IMPLANT
WATER STERILE IRR 1000ML POUR (IV SOLUTION) IMPLANT
YANKAUER SUCT BULB TIP NO VENT (SUCTIONS) IMPLANT

## 2012-03-28 NOTE — H&P (View-Only) (Signed)
Subjective:   Right breast/chest wall mass  Patient ID: [name], female   DOB: 1946-12-10, 66 y.o.   MRN: [id number]  HPI Patient returns to the office. She has a history of right total mastectomy for invasive and in situ carcinoma of the right breast with a 6 mm invasive component, triple negative. She underwent right total mastectomy due to previous history of radiation for Hodgkin's disease and inability to take further radiation. Sentinel lymph node biopsy was also negative. This was in 2009 and she has been followed with no evidence of disease since. She had declined adjuvant treatment at that time. Recent bilateral breast MRI was performed which revealed an 8 mm nodular enhancement superficially at the 10:00 position over her subpectoral implant on the right side. Ultrasound was performed which showed a somewhat suspicious appearing solid density. Initially a needle biopsy was planned but there was expressed some significant risk of implant rupture and the patient would prefer to have this excised. She feels that it has been there for some time but not sure exactly how long it has been growing.  Past Medical History  Diagnosis Date  . Atrial fibrillation   . Other left bundle branch block   . Secondary cardiomyopathy, unspecified   . Unspecified essential hypertension   . Unspecified hypothyroidism   . Carcinoma in situ of breast   . Raynaud's disease   . Hodgkin's disease     stage 2b  . Bundle branch block     left   Past Surgical History  Procedure Date  . Tonsillectomy and adenoidectomy 1965  . Thoractomy 1968  . Btl 1971  . R vein ligation & stripping 1976  . Spleenectomy 1988    for Hodgkin's disease  . Breast lumpectomy 2009    right, lymph node biopsy  . Mastectomy 2009    right breast  . Polypectomy 1990    D&C (also in 1977 and 1991)   . Dilation and curettage of uterus   . Tissue expander placement     right breast  . Tissue expander  removal w/  replacement of implant    Current Outpatient Prescriptions  Medication Sig Dispense Refill  . calcium carbonate (OS-CAL) 600 MG TABS Take 600 mg by mouth 2 (two) times daily with a meal.        . carvedilol (COREG) 12.5 MG tablet Take one and 1/2  tablets twice daily.  90 tablet  6  . Cholecalciferol (VITAMIN D3) 5000 UNITS CAPS Take by mouth daily.        Marland Kitchen levothyroxine (SYNTHROID, LEVOTHROID) 112 MCG tablet Take 125 mcg by mouth daily.       Marland Kitchen losartan (COZAAR) 50 MG tablet Take 1 tablet (50 mg total) by mouth daily.  30 tablet  12  . magnesium oxide (MAG-OX 400) 400 MG tablet Take 400 mg by mouth 2 (two) times daily.        . Multiple Vitamins-Minerals (MULTIVITAL) tablet Take 1 tablet by mouth 2 (two) times daily.        . NON FORMULARY Osteodenz  1 po bid       . NON FORMULARY Vegetarian omega green  3 po daily        Allergies  Allergen Reactions  . Benazepril     cough  . Epinephrine     REACTION: BUZZ  . Iohexol      Desc: SNEEZING    History  Substance Use Topics  . Smoking status: Never  Smoker   . Smokeless tobacco: Not on file  . Alcohol Use: No     Review of Systems  Respiratory: Negative.   Cardiovascular: Negative.        Objective:   Physical Exam General: Thin healthy-appearing Caucasian female Skin: No rash or infection HEENT: No palpable masses or thyromegaly Lymph nodes: No palpable cervical, supraclavicular or axillary lymph nodes Breasts: Status post reconstruction on the right. At about the 10:00 position superficially just beneath the skin is a firm rounded freely movable approximately 6 or 7 mm mass. No other skin changes or masses. Left breast negative to exam. Lungs: Clear Cardiac: No edema. Regular rate and rhythm.    Assessment:     New right chest wall mass with history of right breast cancer status post total mastectomy. I recommended proceeding with excisional biopsy. We will plan to get a frozen section at the time of excision and  if positive do a little more extensive resection for negative margins. This was discussed and she is in agreement to    Plan:     Excision right chest wall mass under general anesthesia as an outpatient.

## 2012-03-28 NOTE — Anesthesia Postprocedure Evaluation (Signed)
  Anesthesia Post-op Note  Patient: [name]  Procedure(s) Performed: Procedure(s) (LRB): EXCISION MASS (Right)  Patient Location: PACU  Anesthesia Type: General  Level of Consciousness: awake  Airway and Oxygen Therapy: Patient Spontanous Breathing  Post-op Pain: mild  Post-op Assessment: Post-op Vital signs reviewed, Patient's Cardiovascular Status Stable, Respiratory Function Stable, Patent Airway and Pain level controlled  Post-op Vital Signs: stable  Complications: No apparent anesthesia complications

## 2012-03-28 NOTE — Anesthesia Procedure Notes (Signed)
Procedure Name: LMA Insertion Date/Time: 03/28/2012 12:18 PM Performed by: Verlan Friends Pre-anesthesia Checklist: Patient identified, Emergency Drugs available, Suction available, Patient being monitored and Timeout performed Patient Re-evaluated:Patient Re-evaluated prior to inductionOxygen Delivery Method: Circle System Utilized Preoxygenation: Pre-oxygenation with 100% oxygen Intubation Type: IV induction Ventilation: Mask ventilation without difficulty LMA: LMA inserted LMA Size: 4.0 Number of attempts: 1 (atraumatic) Airway Equipment and Method: bite block (left bite gard used) Placement Confirmation: positive ETCO2 Tube secured with: Tape Dental Injury: Teeth and Oropharynx as per pre-operative assessment

## 2012-03-28 NOTE — Discharge Instructions (Signed)
[facility] °Office Phone Number [phone] ° °BREAST BIOPSY/ PARTIAL MASTECTOMY: POST OP INSTRUCTIONS ° °Always review your discharge instruction sheet given to you by the facility where your surgery was performed. ° °IF YOU HAVE DISABILITY OR FAMILY LEAVE FORMS, YOU MUST BRING THEM TO THE OFFICE FOR PROCESSING.  DO NOT GIVE THEM TO YOUR DOCTOR. ° °1. A prescription for pain medication may be given to you upon discharge.  Take your pain medication as prescribed, if needed.  If narcotic pain medicine is not needed, then you may take acetaminophen (Tylenol) or ibuprofen (Advil) as needed. °2. Take your usually prescribed medications unless otherwise directed °3. If you need a refill on your pain medication, please contact your pharmacy.  They will contact our office to request authorization.  Prescriptions will not be filled after 5pm or on week-ends. °4. You should eat very light the first 24 hours after surgery, such as soup, crackers, pudding, etc.  Resume your normal diet the day after surgery. °5. Most patients will experience some swelling and bruising in the breast.  Ice packs and a good support bra will help.  Swelling and bruising can take several days to resolve.  °6. It is common to experience some constipation if taking pain medication after surgery.  Increasing fluid intake and taking a stool softener will usually help or prevent this problem from occurring.  A mild laxative (Milk of Magnesia or Miralax) should be taken according to package directions if there are no bowel movements after 48 hours. °7. Unless discharge instructions indicate otherwise, you may remove your bandages 24-48 hours after surgery, and you may shower at that time.  You may have steri-strips (small skin tapes) in place directly over the incision.  These strips should be left on the skin for 7-10 days.  If your surgeon used skin glue on the incision, you may shower in 24 hours.  The glue will flake off over the  next 2-3 weeks.  Any sutures or staples will be removed at the office during your follow-up visit. °8. ACTIVITIES:  You may resume regular daily activities (gradually increasing) beginning the next day.  Wearing a good support bra or sports bra minimizes pain and swelling.  You may have sexual intercourse when it is comfortable. °a. You may drive when you no longer are taking prescription pain medication, you can comfortably wear a seatbelt, and you can safely maneuver your car and apply brakes. °b. RETURN TO WORK:  ______________________________________________________________________________________ °9. You should see your doctor in the office for a follow-up appointment approximately two weeks after your surgery.  Your doctor’s nurse will typically make your follow-up appointment when she calls you with your pathology report.  Expect your pathology report 2-3 business days after your surgery.  You may call to check if you do not hear from us after three days. °10. OTHER INSTRUCTIONS: _______________________________________________________________________________________________ _____________________________________________________________________________________________________________________________________ °_____________________________________________________________________________________________________________________________________ °_____________________________________________________________________________________________________________________________________ ° °WHEN TO CALL YOUR DOCTOR: °1. Fever over 101.0 °2. Nausea and/or vomiting. °3. Extreme swelling or bruising. °4. Continued bleeding from incision. °5. Increased pain, redness, or drainage from the incision. ° °The clinic staff is available to answer your questions during regular business hours.  Please don’t hesitate to call and ask to speak to one of the nurses for clinical concerns.  If you have a medical emergency, go to the nearest  emergency room or call 911.  A surgeon from [facility] is always on call at the hospital. ° °For further questions, please visit [email or web address]  ° ° °  Post Anesthesia Home Care Instructions ° °Activity: °Get plenty of rest for the remainder of the day. A responsible adult should stay with you for 24 hours following the procedure.  °For the next 24 hours, DO NOT: °-Drive a car °-Operate machinery °-Drink alcoholic beverages °-Take any medication unless instructed by your physician °-Make any legal decisions or sign important papers. ° °Meals: °Start with liquid foods such as gelatin or soup. Progress to regular foods as tolerated. Avoid greasy, spicy, heavy foods. If nausea and/or vomiting occur, drink only clear liquids until the nausea and/or vomiting subsides. Call your physician if vomiting continues. ° °Special Instructions/Symptoms: °Your throat may feel dry or sore from the anesthesia or the breathing tube placed in your throat during surgery. If this causes discomfort, gargle with warm salt water. The discomfort should disappear within 24 hours. ° ° °Call your surgeon if you experience:  ° °1.  Fever over 101.0. °2.  Inability to urinate. °3.  Nausea and/or vomiting. °4.  Extreme swelling or bruising at the surgical site. °5.  Continued bleeding from the incision. °6.  Increased pain, redness or drainage from the incision. °7.  Problems related to your pain medication. ° ° °

## 2012-03-28 NOTE — Op Note (Signed)
Preoperative Diagnosis: Right chest wall mass ,history of breast cancer  Postoprative Diagnosis: Right chest wall mass ,history of breast cancer  Procedure: Procedure(s): EXCISION MASS   Surgeon: Glenna Fellows T   Assistants: none  Anesthesia:  General LMA anesthesiaDiagnos  Indications:   Patient is a 66 year old female with a history of right total mastectomy for invasive cancer of the right breast and 2009. She had subsequently a sub-pectoral implant reconstruction. Mastectomy was performed at that time instead of lumpectomy due to history of radiation to the chest for remote Hodgkin's disease. She now presents with a small firm palpable nodule in the deep subcutaneous tissue overlying the superior flap over reconstructed chest wall. This is felt to close to the implant for safe core biopsy. I. Therefore recommend proceeding with excisional biopsy under general anesthesia in the operating room with frozen section and wider excision to obtain negative margins should the frozen section be positive. I discussed the nature procedure with the patient including indications and risks of bleeding, infection, anesthetic complications, and possible risk of implant rupture. She understands and agrees  Procedure Detail:  Patient is brought to the operating room placed in supine position on the operating table and laryngeal mask general anesthesia induced. Right chest wall was widely sterilely prepped and draped. She received preoperative IV antibiotics. Correct patient and procedure were verified. I initially made an elliptical transverse incision taking immediately overlying skin with about a 2 cm incision. The nodule was adherent or within the underlying pectoralis muscle and excising some of this muscle the nodule was grossly completely excised with sharp and cautery dissection. This was sent for frozen section and did reveal mammary carcinoma appear I then did further excision by increasing the  size of the skin incision in all directions taking about a 5 cm length of the skin elliptically transversely oriented. Dissection was deepened down through the subcutaneous tissue to the muscle. As much as possible the muscle was then dissected up off of the implant which was exposed but not damaged. The specimen was then completely excised and oriented and sent for permanent pathology. There was just a little bit of muscle and capsule over the implant directly posterior to this excision and I did take this right down to the implant as a further posterior margin. This was all sent for permanent pathology. Hemostasis was assured and then the subcutaneous was closed with interrupted 4-0 Monocryl the skin with a running 4-0 nylon. Sponge needle as the counts were correct.    Estimated Blood Loss:  Minimal          Blood Given: none          Specimens: #1 chest wall nodule #2 further wider excision of #1 #3 further posterior margin        Complications:  * No complications entered in OR log *         Disposition: PACU - hemodynamically stable.         Condition: stable  Mariella Saa MD, FACS  03/28/2012, 1:23 PM

## 2012-03-28 NOTE — Transfer of Care (Signed)
Immediate Anesthesia Transfer of Care Note  Patient: [name]  Procedure(s) Performed: Procedure(s) (LRB): EXCISION MASS (Right)  Patient Location: PACU  Anesthesia Type: General  Level of Consciousness: awake, alert , oriented and patient cooperative  Airway & Oxygen Therapy: Patient Spontanous Breathing and Patient connected to face mask oxygen  Post-op Assessment: Report given to PACU RN and Post -op Vital signs reviewed and stable  Post vital signs: Reviewed and stable  Complications: No apparent anesthesia complications

## 2012-03-28 NOTE — Interval H&P Note (Signed)
History and Physical Interval Note:  03/28/2012 11:33 AM  [name]  has presented today for surgery, with the diagnosis of Right chest wall mass ,history of breast cancer  The various methods of treatment have been discussed with the patient and family. After consideration of risks, benefits and other options for treatment, the patient has consented to  Procedure(s) (LRB): EXCISION MASS (Right) as a surgical intervention .  The patients' history has been reviewed, patient examined, no change in status, stable for surgery.  I have reviewed the patients' chart and labs.  Questions were answered to the patient's satisfaction.     Macio Kissoon T

## 2012-03-28 NOTE — Anesthesia Preprocedure Evaluation (Addendum)
Anesthesia Evaluation  Patient identified by MRN, date of birth, ID band Patient awake    Reviewed: Allergy & Precautions, H&P , NPO status , Patient's Chart, lab work & pertinent test results  Airway Mallampati: I TM Distance: >3 FB Neck ROM: Full    Dental   Pulmonary    Pulmonary exam normal       Cardiovascular hypertension, + dysrhythmias Atrial Fibrillation and Supra Ventricular Tachycardia Rhythm:Regular Rate:Normal     Neuro/Psych    GI/Hepatic   Endo/Other  Hypothyroidism   Renal/GU      Musculoskeletal   Abdominal Normal abdominal exam  (+)   Peds  Hematology   Anesthesia Other Findings   Reproductive/Obstetrics                          Anesthesia Physical Anesthesia Plan  ASA: III  Anesthesia Plan: General   Post-op Pain Management:    Induction: Intravenous  Airway Management Planned: LMA  Additional Equipment:   Intra-op Plan:   Post-operative Plan: Extubation in OR  Informed Consent: I have reviewed the patients History and Physical, chart, labs and discussed the procedure including the risks, benefits and alternatives for the proposed anesthesia with the patient or authorized representative who has indicated his/her understanding and acceptance.     Plan Discussed with: CRNA and Surgeon  Anesthesia Plan Comments:        Anesthesia Quick Evaluation

## 2012-03-29 ENCOUNTER — Encounter (HOSPITAL_BASED_OUTPATIENT_CLINIC_OR_DEPARTMENT_OTHER): Payer: Self-pay | Admitting: General Surgery

## 2012-03-30 ENCOUNTER — Telehealth (INDEPENDENT_AMBULATORY_CARE_PROVIDER_SITE_OTHER): Payer: Self-pay | Admitting: General Surgery

## 2012-03-30 NOTE — Telephone Encounter (Signed)
Called pt with path report.  

## 2012-04-02 ENCOUNTER — Telehealth (INDEPENDENT_AMBULATORY_CARE_PROVIDER_SITE_OTHER): Payer: Self-pay | Admitting: General Surgery

## 2012-04-03 ENCOUNTER — Telehealth (INDEPENDENT_AMBULATORY_CARE_PROVIDER_SITE_OTHER): Payer: Self-pay | Admitting: General Surgery

## 2012-04-03 ENCOUNTER — Other Ambulatory Visit (INDEPENDENT_AMBULATORY_CARE_PROVIDER_SITE_OTHER): Payer: Self-pay

## 2012-04-03 DIAGNOSIS — C50919 Malignant neoplasm of unspecified site of unspecified female breast: Secondary | ICD-10-CM

## 2012-04-03 NOTE — Telephone Encounter (Signed)
Return call to patient--  Spoke to patient spouse, follow up appointment given.  I will review with Dr. Johna Sheriff patient pathology and give patient a call to answer all her questions.  Referral to Dr. Donnie Coffin and Radiation oncology have been forwarded to Tami[facility]Lisa [facility] for scheduling.

## 2012-04-04 ENCOUNTER — Telehealth: Payer: Self-pay | Admitting: *Deleted

## 2012-04-04 NOTE — Telephone Encounter (Signed)
Follow up appointment given to patient 04/12/12 @ 12:30 with Dr. Johna Sheriff.

## 2012-04-04 NOTE — Telephone Encounter (Signed)
Confirmed 04/06/12 appt w/ pt.

## 2012-04-04 NOTE — Telephone Encounter (Signed)
[name] called and would like to know more details regarding her pathology results.

## 2012-04-06 ENCOUNTER — Ambulatory Visit (HOSPITAL_BASED_OUTPATIENT_CLINIC_OR_DEPARTMENT_OTHER): Payer: Medicare Other | Admitting: Oncology

## 2012-04-06 ENCOUNTER — Other Ambulatory Visit: Payer: Self-pay

## 2012-04-06 ENCOUNTER — Telehealth (INDEPENDENT_AMBULATORY_CARE_PROVIDER_SITE_OTHER): Payer: Self-pay | Admitting: General Surgery

## 2012-04-06 ENCOUNTER — Telehealth: Payer: Self-pay | Admitting: *Deleted

## 2012-04-06 ENCOUNTER — Other Ambulatory Visit: Payer: Medicare Other | Admitting: Lab

## 2012-04-06 VITALS — BP 129/72 | HR 87 | Temp 98.7°F | Ht 62.0 in | Wt 138.1 lb

## 2012-04-06 DIAGNOSIS — C50419 Malignant neoplasm of upper-outer quadrant of unspecified female breast: Secondary | ICD-10-CM

## 2012-04-06 DIAGNOSIS — C50919 Malignant neoplasm of unspecified site of unspecified female breast: Secondary | ICD-10-CM

## 2012-04-06 NOTE — Telephone Encounter (Signed)
Dr. Donnie Coffin would like Dr. Johna Sheriff to place a port-a-cath in this pt.  She has appt with Dr. Johna Sheriff on 04/12/12 for 1st post op check.

## 2012-04-06 NOTE — Telephone Encounter (Signed)
gave patient appointment for chemo class on 04-11-2012 called dr. Johna Sheriff office and requested the patient is needing to have a port a cath placed

## 2012-04-06 NOTE — Progress Notes (Signed)
No images are attached to the encounter. No scans are attached to the encounter. No scans are attached to the encounter. [facility] OFFICE PROGRESS NOTE  Thora Lance, MD, MD [address] [place] [address]  DIAGNOSIS:  1. T1bN0, triple negative breast cancer 2. History of cardiomyopathy secondary to radiation received for Hodgkin's disease many years ago.  PRIOR THERAPY: Status post mastectomy 04/29/2008, status post reconstruction with implant  CURRENT THERAPY: Observation  INTERVAL HISTORY: Patient returns for followup. When seen she was referred for MRI scan of both breasts. There was a questionable thickening in the upper-outer quadrant of the right breast. I do not really 8 mm nodular enhancement o'clock position over the subpectoral implant. Ultrasound showed a solid density which was suspicious. She'll underwent excision of this area on 03/28/2012. This showed invasive ductal cancer with cell morphology as a visual cancer. Margins were initially involved and subsequently a reexcision revealed no further tumor. A prognostic panel was not performed. Her postoperative course was unremarkable. MEDICAL HISTORY: Past Medical History  Diagnosis Date  . Atrial fibrillation   . Other left bundle branch block   . Secondary cardiomyopathy, unspecified   . Unspecified essential hypertension   . Unspecified hypothyroidism   . Carcinoma in situ of breast   . Raynaud's disease   . Hodgkin's disease     stage 2b  . Bundle branch block     left    ALLERGIES:  is allergic to benazepril; epinephrine; and iohexol.  MEDICATIONS:  Current Outpatient Prescriptions  Medication Sig Dispense Refill  . calcium carbonate (OS-CAL) 600 MG TABS Take 600 mg by mouth 2 (two) times daily with a meal.        . carvedilol (COREG) 12.5 MG tablet Take one and 1/2  tablets twice daily.  90 tablet  6  . Cholecalciferol (VITAMIN D3) 5000 UNITS CAPS Take 3,000 Units by mouth daily.        Marland Kitchen glucosamine-chondroitin 500-400 MG tablet Take 1 tablet by mouth 3 (three) times daily.      Marland Kitchen levothyroxine (SYNTHROID, LEVOTHROID) 125 MCG tablet Take 125 mcg by mouth daily.      Marland Kitchen losartan (COZAAR) 50 MG tablet Take 1 tablet (50 mg total) by mouth daily.  30 tablet  12  . magnesium oxide (MAG-OX 400) 400 MG tablet Take 400 mg by mouth 2 (two) times daily.        . Multiple Vitamins-Minerals (MULTIVITAL) tablet Take 1 tablet by mouth 2 (two) times daily.        . NON FORMULARY Osteodenz  1 po bid      . NON FORMULARY Vegetarian omega green  3 po daily      . HYDROcodone-acetaminophen (NORCO) 5-325 MG per tablet Take 1-2 tablets by mouth every 4 (four) hours as needed for pain.  20 tablet  1  . DISCONTD: levothyroxine (SYNTHROID, LEVOTHROID) 112 MCG tablet Take 125 mcg by mouth daily.         SURGICAL HISTORY:  Past Surgical History  Procedure Date  . Tonsillectomy and adenoidectomy 1965  . Thoractomy 1968  . Btl 1971  . R vein ligation & stripping 1976  . Spleenectomy 1988    for Hodgkin's disease  . Breast lumpectomy 2009    right, lymph node biopsy  . Mastectomy 2009    right breast  . Polypectomy 1990    D&C (also in 1977 and 1991)   . Dilation and curettage of uterus   .  Tissue expander placement     right breast  . Tissue expander  removal w/ replacement of implant   . Mass excision 03/28/2012    Procedure: EXCISION MASS;  Surgeon: Mariella Saa, MD;  Location: [facility];  Service: General;  Laterality: Right;  Excision right chest wall mass    REVIEW OF SYSTEMS:  Pertinent items are noted in HPI.   PHYSICAL EXAMINATION: General appearance: alert, cooperative, appears stated age and no distress Head: Normocephalic, without obvious abnormality, atraumatic Neck: no adenopathy, no JVD, supple, symmetrical, trachea midline, thyroid not enlarged, symmetric, no tenderness/mass/nodules and left carotid bruit Lymph nodes: Cervical,  supraclavicular, and axillary nodes normal. Resp: clear to auscultation bilaterally Cardio: regular rate and rhythm, S1, S2 normal, no murmur, click, rub or gallop GI: soft, non-tender; bowel sounds normal; no masses,  no organomegaly Extremities: extremities normal, atraumatic, no cyanosis or edema Neurologic: Alert and oriented X 3, normal strength and tone. Normal symmetric reflexes. Normal coordination and gait Breast: Right breast is status post implant, there is a fresh incision over the breast which is healing well.Nothing palpable in the right axilla left breast is without skin changes of concern no masses, nipple discharge and nothing palpable in the left inguinal  ECOG PERFORMANCE STATUS: 0 - Asymptomatic  Blood pressure 129/72, pulse 87, temperature 98.7 F (37.1 C), temperature source Oral, height 5\' 2"  (1.575 m), weight 138 lb 1.6 oz (62.642 kg), last menstrual period 03/08/1995.  LABORATORY DATA: Lab Results  Component Value Date   WBC 6.7 02/17/2012   HGB 12.8 02/17/2012   HCT 39.3 02/17/2012   MCV 95.5 02/17/2012   PLT 305 02/17/2012      Chemistry      Component Value Date/Time   NA 141 03/26/2012 1035   K 5.3* 03/26/2012 1035   CL 101 03/26/2012 1035   CO2 31 03/26/2012 1035   BUN 26* 03/26/2012 1035   CREATININE 0.88 03/26/2012 1035      Component Value Date/Time   CALCIUM 9.2 03/26/2012 1035   ALKPHOS 105 02/17/2012 0806   AST 14 02/17/2012 0806   ALT 12 02/17/2012 0806   BILITOT 0.4 02/17/2012 0806       RADIOGRAPHIC STUDIES:  No results found.   ASSESSMENT/PLAN:  Patient is a 66 year old woman who now presents with a recurrence of her breast cancer approximately 3 years from her initial diagnosis. Initially she had a T1 B. N0 breast cancer. She declined chemotherapy and was not felt to be a candidate for radiation because of her history of mantle field radiation therapy. I discussed my overall plan which would be to consider her for adjuvant" chemotherapy. We will ask for a  port to be placed and she'll undergo staging PET scan as well as chemotherapy education. Will present her at our breast conference as well. I did explain that in the setting of a low level of recurrence risk of systemic spread down and her motor is somewhat high and so at chemotherapy in this setting would be advised. She that looks after her husband who underwent a kidney transplant year ago but is doing fairly well. She understands the need for chemotherapy. I plan to see her back in about 2 weeks.  Murat Rideout, md     All questions were answered. The patient knows to call the clinic with any problems, questions or concerns. We can certainly see the patient much sooner if necessary.

## 2012-04-09 ENCOUNTER — Other Ambulatory Visit (INDEPENDENT_AMBULATORY_CARE_PROVIDER_SITE_OTHER): Payer: Self-pay | Admitting: General Surgery

## 2012-04-10 ENCOUNTER — Encounter (HOSPITAL_COMMUNITY): Payer: Self-pay | Admitting: Pharmacy Technician

## 2012-04-11 ENCOUNTER — Encounter: Payer: Self-pay | Admitting: *Deleted

## 2012-04-11 ENCOUNTER — Other Ambulatory Visit: Payer: Medicare Other

## 2012-04-11 ENCOUNTER — Telehealth: Payer: Self-pay | Admitting: *Deleted

## 2012-04-11 ENCOUNTER — Other Ambulatory Visit: Payer: Self-pay | Admitting: *Deleted

## 2012-04-11 NOTE — Telephone Encounter (Signed)
Pt. Called. She would like to pick up script for a wig (on advise from [employer]).  She will pick this up on Tuesday 04/17/12.  She also is wondering when she will start her chemotherapy. She has no appointments for this at present.  Her PAC is being placed on Monday 4/29.  Will discuss with Dr.Rubin and get back with her.

## 2012-04-12 ENCOUNTER — Encounter (HOSPITAL_COMMUNITY): Payer: Self-pay

## 2012-04-12 ENCOUNTER — Ambulatory Visit (INDEPENDENT_AMBULATORY_CARE_PROVIDER_SITE_OTHER): Payer: Medicare Other | Admitting: General Surgery

## 2012-04-12 ENCOUNTER — Encounter (INDEPENDENT_AMBULATORY_CARE_PROVIDER_SITE_OTHER): Payer: Self-pay | Admitting: General Surgery

## 2012-04-12 ENCOUNTER — Ambulatory Visit (HOSPITAL_COMMUNITY)
Admission: RE | Admit: 2012-04-12 | Discharge: 2012-04-12 | Disposition: A | Discharge status: Home / Self Care | Payer: Medicare Other | Source: Ambulatory Visit | Attending: General Surgery | Admitting: General Surgery

## 2012-04-12 ENCOUNTER — Encounter (HOSPITAL_COMMUNITY)
Admission: RE | Admit: 2012-04-12 | Discharge: 2012-04-12 | Disposition: A | Discharge status: Home / Self Care | Payer: Medicare Other | Source: Ambulatory Visit | Attending: General Surgery | Admitting: General Surgery

## 2012-04-12 VITALS — BP 114/70 | HR 80 | Temp 97.5°F | Resp 16 | Ht 62.0 in | Wt 137.4 lb

## 2012-04-12 DIAGNOSIS — C50919 Malignant neoplasm of unspecified site of unspecified female breast: Secondary | ICD-10-CM | POA: Insufficient documentation

## 2012-04-12 DIAGNOSIS — R918 Other nonspecific abnormal finding of lung field: Secondary | ICD-10-CM | POA: Diagnosis not present

## 2012-04-12 DIAGNOSIS — Z01818 Encounter for other preprocedural examination: Secondary | ICD-10-CM | POA: Diagnosis not present

## 2012-04-12 DIAGNOSIS — Z01812 Encounter for preprocedural laboratory examination: Secondary | ICD-10-CM | POA: Insufficient documentation

## 2012-04-12 DIAGNOSIS — Z01811 Encounter for preprocedural respiratory examination: Secondary | ICD-10-CM | POA: Diagnosis not present

## 2012-04-12 HISTORY — DX: Anemia, unspecified: D64.9

## 2012-04-12 HISTORY — DX: Encounter for other specified aftercare: Z51.89

## 2012-04-12 HISTORY — DX: Reserved for inherently not codable concepts without codable children: IMO0001

## 2012-04-12 HISTORY — DX: Pneumonia, unspecified organism: J18.9

## 2012-04-12 HISTORY — DX: Unspecified osteoarthritis, unspecified site: M19.90

## 2012-04-12 LAB — BASIC METABOLIC PANEL
BUN: 21 mg/dL (ref 6–23)
Creatinine, Ser: 0.71 mg/dL (ref 0.50–1.10)
GFR calc Af Amer: >90 mL/min (ref >90)
GFR calc non Af Amer: 88 mL/min — ABNORMAL LOW (ref >90)
Potassium: 4.8 mEq/L (ref 3.5–5.1)

## 2012-04-12 LAB — CBC
HCT: 37.2 % (ref 36.0–46.0)
MCHC: 33.1 g/dL (ref 30.0–36.0)
MCV: 93.2 fL (ref 78.0–100.0)
Platelets: 331 10*3/uL (ref 150–400)
RDW: 14.1 % (ref 11.5–15.5)
WBC: 7.5 10*3/uL (ref 4.0–10.5)

## 2012-04-12 LAB — SURGICAL PCR SCREEN: MRSA, PCR: NEGATIVE

## 2012-04-12 NOTE — Patient Instructions (Signed)
19 [name]  04/12/2012   Your procedure is scheduled on:  04/16/12 156pm-258pm  Report to [facility] at 1130 AM.  Call this number if you have problems the morning of surgery: [phone]   Remember:   Do not eat food:After Midnight.  May have clear liquids:until 0730am then npo .  Clear liquids include soda, tea, black coffee, apple or grape juice, broth.  Take these medicines the morning of surgery with A SIP OF WATER:    Do not wear jewelry, make-up or nail polish.  Do not wear lotions, powders, or perfumes.   Do not shave 48 hours prior to surgery.  Do not bring valuables to the hospital.  Contacts, dentures or bridgework may not be worn into surgery.      Patients discharged the day of surgery will not be allowed to drive home.  Name and phone number of your driver:  Special Instructions: CHG Shower Use Special Wash: 1/2 bottle night before surgery and 1/2 bottle morning of surgery. shower chin to toes with CHG.  Wash face and private parts with regular soap.    Please read over the following fact sheets that you were given: MRSA Information, coughing and deep breathing exercises, leg exercise

## 2012-04-12 NOTE — Pre-Procedure Instructions (Signed)
Carotid duplex 11/28/11 on chart  EKG 11/28/11 on chart  LOV with Dr Eden Emms 11/28/11 on chart

## 2012-04-12 NOTE — Progress Notes (Signed)
History: The patient returns following excision of the right chest wall nodule which proved to be recurrent triple negative invasive mammary carcinoma. She has seen Dr. Donnie Coffin and chemotherapy is planned. She is not seen radiation oncology but was discussed and the cancer conference earlier this week which I was unable to attend. I'm going to try to confirm with Dr. Dayton Scrape that radiation is not an additional option. Patient reports no problems from her excision.  Pathology as above and margins on the final excision were negative.  Exam: Her wound is well healed and I removed the sutures a Steri-Strip the wound. We discussed Port-A-Cath placement next week.

## 2012-04-13 ENCOUNTER — Other Ambulatory Visit: Payer: Self-pay | Admitting: *Deleted

## 2012-04-13 MED ORDER — HYDROMORPHONE HCL PF 1 MG/ML IJ SOLN
INTRAMUSCULAR | Status: AC
  Filled 2012-04-13: qty 1

## 2012-04-16 ENCOUNTER — Encounter (HOSPITAL_COMMUNITY): Payer: Self-pay | Admitting: Anesthesiology

## 2012-04-16 ENCOUNTER — Encounter (HOSPITAL_COMMUNITY)
Admission: RE | Disposition: A | Discharge status: Home / Self Care | Payer: Self-pay | Source: Ambulatory Visit | Attending: General Surgery

## 2012-04-16 ENCOUNTER — Ambulatory Visit (HOSPITAL_COMMUNITY): Payer: Medicare Other

## 2012-04-16 ENCOUNTER — Encounter (HOSPITAL_COMMUNITY): Payer: Self-pay | Admitting: *Deleted

## 2012-04-16 ENCOUNTER — Ambulatory Visit (HOSPITAL_COMMUNITY)
Admission: RE | Admit: 2012-04-16 | Discharge: 2012-04-16 | Disposition: A | Discharge status: Home / Self Care | Payer: Medicare Other | Source: Ambulatory Visit | Attending: General Surgery | Admitting: General Surgery

## 2012-04-16 ENCOUNTER — Ambulatory Visit (HOSPITAL_COMMUNITY): Payer: Medicare Other | Admitting: Anesthesiology

## 2012-04-16 DIAGNOSIS — C50919 Malignant neoplasm of unspecified site of unspecified female breast: Secondary | ICD-10-CM | POA: Diagnosis not present

## 2012-04-16 DIAGNOSIS — J189 Pneumonia, unspecified organism: Secondary | ICD-10-CM | POA: Diagnosis not present

## 2012-04-16 DIAGNOSIS — Z853 Personal history of malignant neoplasm of breast: Secondary | ICD-10-CM | POA: Diagnosis not present

## 2012-04-16 DIAGNOSIS — Z09 Encounter for follow-up examination after completed treatment for conditions other than malignant neoplasm: Secondary | ICD-10-CM | POA: Diagnosis not present

## 2012-04-16 DIAGNOSIS — I4891 Unspecified atrial fibrillation: Secondary | ICD-10-CM | POA: Diagnosis not present

## 2012-04-16 HISTORY — PX: PORTACATH PLACEMENT: SHX2246

## 2012-04-16 SURGERY — INSERTION, TUNNELED CENTRAL VENOUS DEVICE, WITH PORT
Anesthesia: Monitor Anesthesia Care | Site: Chest | Laterality: Left | Wound class: Clean

## 2012-04-16 MED ORDER — 0.9 % SODIUM CHLORIDE (POUR BTL) OPTIME
TOPICAL | Status: DC | PRN
  Administered 2012-04-16: 1000 mL

## 2012-04-16 MED ORDER — FENTANYL CITRATE 0.05 MG/ML IJ SOLN: 25.0000 ug | INTRAMUSCULAR | Status: DC | PRN

## 2012-04-16 MED ORDER — SODIUM CHLORIDE 0.9 % IR SOLN
Status: DC | PRN
  Administered 2012-04-16: 14:00:00

## 2012-04-16 MED ORDER — FENTANYL CITRATE 0.05 MG/ML IJ SOLN
INTRAMUSCULAR | Status: DC | PRN
  Administered 2012-04-16 (×2): 25 ug via INTRAVENOUS
  Administered 2012-04-16: 50 ug via INTRAVENOUS

## 2012-04-16 MED ORDER — SODIUM CHLORIDE 0.9 % IR SOLN
Freq: Once | Status: DC
  Filled 2012-04-16: qty 1.2

## 2012-04-16 MED ORDER — EPHEDRINE SULFATE 50 MG/ML IJ SOLN
INTRAMUSCULAR | Status: DC | PRN
  Administered 2012-04-16: 10 mg via INTRAVENOUS
  Administered 2012-04-16: 5 mg via INTRAVENOUS

## 2012-04-16 MED ORDER — BUPIVACAINE HCL (PF) 0.5 % IJ SOLN
INTRAMUSCULAR | Status: DC | PRN
  Administered 2012-04-16: 30 mL

## 2012-04-16 MED ORDER — LACTATED RINGERS IV SOLN
INTRAVENOUS | Status: DC
  Administered 2012-04-16: 1000 mL via INTRAVENOUS

## 2012-04-16 MED ORDER — PROMETHAZINE HCL 25 MG/ML IJ SOLN: 6.2500 mg | INTRAMUSCULAR | Status: DC | PRN

## 2012-04-16 MED ORDER — HYDROCODONE-ACETAMINOPHEN 5-325 MG PO TABS: 1.0000 | ORAL_TABLET | ORAL | Status: AC | PRN

## 2012-04-16 MED ORDER — CEFAZOLIN SODIUM 1-5 GM-% IV SOLN
1.0000 g | INTRAVENOUS | Status: AC
  Administered 2012-04-16: 1 g via INTRAVENOUS

## 2012-04-16 MED ORDER — HEPARIN SOD (PORK) LOCK FLUSH 100 UNIT/ML IV SOLN
INTRAVENOUS | Status: AC
  Filled 2012-04-16: qty 5

## 2012-04-16 MED ORDER — CEFAZOLIN SODIUM 1-5 GM-% IV SOLN
INTRAVENOUS | Status: AC
  Filled 2012-04-16: qty 50

## 2012-04-16 MED ORDER — HEPARIN SOD (PORK) LOCK FLUSH 100 UNIT/ML IV SOLN
INTRAVENOUS | Status: DC | PRN
  Administered 2012-04-16: 500 [IU]

## 2012-04-16 MED ORDER — MIDAZOLAM HCL 5 MG/5ML IJ SOLN
INTRAMUSCULAR | Status: DC | PRN
  Administered 2012-04-16: 2 mg via INTRAVENOUS

## 2012-04-16 MED ORDER — LIDOCAINE HCL (CARDIAC) 20 MG/ML IV SOLN
INTRAVENOUS | Status: DC | PRN
  Administered 2012-04-16: 100 mg via INTRAVENOUS

## 2012-04-16 MED ORDER — ONDANSETRON HCL 4 MG/2ML IJ SOLN
INTRAMUSCULAR | Status: DC | PRN
  Administered 2012-04-16: 4 mg via INTRAVENOUS

## 2012-04-16 MED ORDER — BUPIVACAINE HCL (PF) 0.5 % IJ SOLN
INTRAMUSCULAR | Status: AC
  Filled 2012-04-16: qty 30

## 2012-04-16 MED ORDER — PROPOFOL 10 MG/ML IV EMUL
INTRAVENOUS | Status: DC | PRN
  Administered 2012-04-16: 200 mg via INTRAVENOUS

## 2012-04-16 SURGICAL SUPPLY — 41 items
BAG DECANTER FOR FLEXI CONT (MISCELLANEOUS) ×2 IMPLANT
BENZOIN TINCTURE PRP APPL 2/3 (GAUZE/BANDAGES/DRESSINGS) ×2 IMPLANT
BLADE HEX COATED 2.75 (ELECTRODE) ×2 IMPLANT
BLADE SURG 15 STRL LF DISP TIS (BLADE) ×1 IMPLANT
BLADE SURG 15 STRL SS (BLADE) ×1
CLOTH BEACON ORANGE TIMEOUT ST (SAFETY) ×2 IMPLANT
COVER SURGICAL LIGHT HANDLE (MISCELLANEOUS) ×2 IMPLANT
DECANTER SPIKE VIAL GLASS SM (MISCELLANEOUS) ×2 IMPLANT
DRAPE C-ARM 42X72 X-RAY (DRAPES) ×2 IMPLANT
DRAPE LAPAROTOMY TRNSV 102X78 (DRAPE) ×2 IMPLANT
DRSG TEGADERM 4X4.75 (GAUZE/BANDAGES/DRESSINGS) IMPLANT
ELECT REM PT RETURN 9FT ADLT (ELECTROSURGICAL) ×2
ELECTRODE REM PT RTRN 9FT ADLT (ELECTROSURGICAL) ×1 IMPLANT
GAUZE SPONGE 4X4 16PLY XRAY LF (GAUZE/BANDAGES/DRESSINGS) ×2 IMPLANT
GLOVE BIOGEL PI IND STRL 7.0 (GLOVE) ×1 IMPLANT
GLOVE BIOGEL PI INDICATOR 7.0 (GLOVE) ×1
GOWN STRL NON-REIN LRG LVL3 (GOWN DISPOSABLE) ×2 IMPLANT
GOWN STRL REIN XL XLG (GOWN DISPOSABLE) ×4 IMPLANT
KIT BASIN OR (CUSTOM PROCEDURE TRAY) ×2 IMPLANT
KIT PORT POWER ISP 8FR (Catheter) IMPLANT
KIT POWER CATH 8FR (Catheter) ×2 IMPLANT
KIT POWER PORT SLIM 6FR (PORTABLE EQUIPMENT SUPPLIES) IMPLANT
NDL SAFETY ECLIPSE 18X1.5 (NEEDLE) ×1 IMPLANT
NEEDLE HYPO 18GX1.5 SHARP (NEEDLE) ×1
NEEDLE HYPO 22GX1.5 SAFETY (NEEDLE) ×2 IMPLANT
NEEDLE HYPO 25X1 1.5 SAFETY (NEEDLE) ×2 IMPLANT
NS IRRIG 1000ML POUR BTL (IV SOLUTION) ×2 IMPLANT
PACK BASIC VI WITH GOWN DISP (CUSTOM PROCEDURE TRAY) ×2 IMPLANT
PEN SKIN MARKING BROAD (MISCELLANEOUS) ×2 IMPLANT
PENCIL BUTTON HOLSTER BLD 10FT (ELECTRODE) ×2 IMPLANT
SPONGE GAUZE 4X4 12PLY (GAUZE/BANDAGES/DRESSINGS) IMPLANT
STRIP CLOSURE SKIN 1/2X4 (GAUZE/BANDAGES/DRESSINGS) ×2 IMPLANT
SUT MNCRL AB 4-0 PS2 18 (SUTURE) ×2 IMPLANT
SUT PROLENE 2 0 CT2 30 (SUTURE) ×2 IMPLANT
SUT SILK 3 0 (SUTURE)
SUT SILK 3-0 18XBRD TIE 12 (SUTURE) IMPLANT
SYR BULB IRRIGATION 50ML (SYRINGE) ×2 IMPLANT
SYR CONTROL 10ML LL (SYRINGE) ×2 IMPLANT
SYRINGE 10CC LL (SYRINGE) ×4 IMPLANT
TAPE CLOTH SURG 4X10 WHT LF (GAUZE/BANDAGES/DRESSINGS) ×2 IMPLANT
TOWEL OR 17X26 10 PK STRL BLUE (TOWEL DISPOSABLE) ×2 IMPLANT

## 2012-04-16 NOTE — Op Note (Signed)
Preoperative diagnosis: Cancer of the breast and the poor venous access  Postoperative diagnosis: Same  Procedure: Placement of Powerport MRI subcutaneous venous port  Surgeon: Glenna Fellows M.D.  Anesthesia: LMA general  Description of procedure: Patient is brought to the operating room and placed in the supine position on the operating table. IV sedation was administered. The entire upper chest and neck were widely sterilely prepped and draped. Local anesthesia was used to infiltrate the insertion of port site. The left subclavian vein was cannulated with a needle and guidewire without difficulty and position in the superior vena cava was confirmed by fluoroscopy. The introducer was then placed over the guidewire and the flush catheter placed via the introducer which was stripped away and the tip of the catheter positioned near the cavoatrial junction. A small transverse incision was made in the anterior chest wall and subcutaneous pocket created. The catheter was tunneled into the pocket, trimmed to length, and attached to the flushed port which was positioned in the pocket. The port was sutured to the chest wall with interrupted 2-0 Prolene. The incisions were closed with subcutaneous interrupted Monocryl and the skin incisions closed with subcuticular Monocryl and Dermabond. The port was accessed and flushed and aspirated easily and was left flushed with concentrated heparin solution. Sponge needle as the counts were correct. The patient was taken to recovery in good condition.  Retal Tonkinson T  04/16/2012

## 2012-04-16 NOTE — Discharge Instructions (Signed)
Call as needed for any problems or concerns 

## 2012-04-16 NOTE — Anesthesia Preprocedure Evaluation (Addendum)
Anesthesia Evaluation  Patient identified by MRN, date of birth, ID band Patient awake    Reviewed: Allergy & Precautions, H&P , NPO status , Patient's Chart, lab work & pertinent test results  Airway Mallampati: II TM Distance: >3 FB Neck ROM: Full    Dental No notable dental hx.    Pulmonary neg pulmonary ROS,  breath sounds clear to auscultation  Pulmonary exam normal       Cardiovascular hypertension, Pt. on medications +CHF + dysrhythmias Atrial Fibrillation Rhythm:Regular Rate:Normal     Neuro/Psych negative neurological ROS  negative psych ROS   GI/Hepatic negative GI ROS, Neg liver ROS,   Endo/Other  negative endocrine ROS  Renal/GU negative Renal ROS  negative genitourinary   Musculoskeletal negative musculoskeletal ROS (+)   Abdominal   Peds negative pediatric ROS (+)  Hematology negative hematology ROS (+)   Anesthesia Other Findings   Reproductive/Obstetrics negative OB ROS                          Anesthesia Physical Anesthesia Plan  ASA: III  Anesthesia Plan: MAC   Post-op Pain Management:    Induction: Intravenous  Airway Management Planned: Simple Face Mask  Additional Equipment:   Intra-op Plan:   Post-operative Plan:   Informed Consent: I have reviewed the patients History and Physical, chart, labs and discussed the procedure including the risks, benefits and alternatives for the proposed anesthesia with the patient or authorized representative who has indicated his/her understanding and acceptance.   Dental advisory given  Plan Discussed with: CRNA  Anesthesia Plan Comments:        Anesthesia Quick Evaluation

## 2012-04-16 NOTE — Anesthesia Postprocedure Evaluation (Signed)
  Anesthesia Post-op Note  Patient: [name]  Procedure(s) Performed: Procedure(s) (LRB): INSERTION PORT-A-CATH (Left)  Patient Location: PACU  Anesthesia Type: General  Level of Consciousness: awake and alert   Airway and Oxygen Therapy: Patient Spontanous Breathing  Post-op Pain: mild  Post-op Assessment: Post-op Vital signs reviewed, Patient's Cardiovascular Status Stable, Respiratory Function Stable, Patent Airway and No signs of Nausea or vomiting  Post-op Vital Signs: stable  Complications: No apparent anesthesia complications

## 2012-04-16 NOTE — H&P (Signed)
  Subjective:    [name] is a 66 y.o. female who presents for portacath placement. She has a recent Dx of locally recurrent breast Ca, S/P excision.     Objective:   BP 128/77  Pulse 74  Temp(Src) 97.1 F (36.2 C) (Oral)  Resp 18  SpO2 98%  LMP 03/08/1995  General:  alert, cooperative and no distress Skin:  normal Eyes: negative, conjunctivae/corneas clear. PERRL, EOM's intact. Fundi benign. Lymph Nodes:  Cervical, supraclavicular, and axillary nodes normal. Lungs:  clear to auscultation bilaterally Heart:  regular rate and rhythm Psychiatric:  normal mood, behavior, speech, dress, and thought processes    Assessment: For portacath placement    Plan:   1. Discussed the risk of surgery,  and the risks of general anesthetic including MI, CVA, sudden death or even reaction to anesthetic medications. The patient understands the risks, any and all questions were answered to the patient's satisfaction.  Mariella Saa MD, FACS  04/16/2012, 12:45 PM

## 2012-04-16 NOTE — Transfer of Care (Signed)
Immediate Anesthesia Transfer of Care Note  Patient: [name]  Procedure(s) Performed: Procedure(s) (LRB): INSERTION PORT-A-CATH (Left)  Patient Location: PACU  Anesthesia Type: General  Level of Consciousness: sedated, patient cooperative and responds to stimulaton  Airway & Oxygen Therapy: Patient Spontanous Breathing and Patient connected to face mask oxgen  Post-op Assessment: Report given to PACU RN and Post -op Vital signs reviewed and stable  Post vital signs: Reviewed and stable  Complications: No apparent anesthesia complications

## 2012-04-16 NOTE — Preoperative (Signed)
Beta Blockers   Reason not to administer Beta Blockers:Not Applicable Pt took Beta Blocker 04-16-12

## 2012-04-17 ENCOUNTER — Encounter (HOSPITAL_COMMUNITY): Payer: Self-pay

## 2012-04-17 ENCOUNTER — Encounter (HOSPITAL_COMMUNITY)
Admission: RE | Admit: 2012-04-17 | Discharge: 2012-04-17 | Disposition: A | Discharge status: Home / Self Care | Payer: Medicare Other | Source: Ambulatory Visit | Attending: Oncology | Admitting: Oncology

## 2012-04-17 ENCOUNTER — Telehealth: Payer: Self-pay | Admitting: *Deleted

## 2012-04-17 ENCOUNTER — Ambulatory Visit (HOSPITAL_BASED_OUTPATIENT_CLINIC_OR_DEPARTMENT_OTHER): Payer: Medicare Other

## 2012-04-17 ENCOUNTER — Other Ambulatory Visit: Payer: Self-pay | Admitting: *Deleted

## 2012-04-17 VITALS — BP 142/79 | HR 77 | Temp 98.1°F

## 2012-04-17 DIAGNOSIS — N6310 Unspecified lump in the right breast, unspecified quadrant: Secondary | ICD-10-CM

## 2012-04-17 DIAGNOSIS — C50519 Malignant neoplasm of lower-outer quadrant of unspecified female breast: Secondary | ICD-10-CM

## 2012-04-17 DIAGNOSIS — Z5111 Encounter for antineoplastic chemotherapy: Secondary | ICD-10-CM | POA: Diagnosis not present

## 2012-04-17 DIAGNOSIS — C50919 Malignant neoplasm of unspecified site of unspecified female breast: Secondary | ICD-10-CM

## 2012-04-17 DIAGNOSIS — Z9010 Acquired absence of unspecified breast and nipple: Secondary | ICD-10-CM | POA: Insufficient documentation

## 2012-04-17 MED ORDER — HEPARIN SOD (PORK) LOCK FLUSH 100 UNIT/ML IV SOLN
500.0000 [IU] | Freq: Once | INTRAVENOUS | Status: AC | PRN
  Administered 2012-04-17: 500 [IU]
  Filled 2012-04-17: qty 5

## 2012-04-17 MED ORDER — FLUDEOXYGLUCOSE F - 18 (FDG) INJECTION
17.4000 | Freq: Once | INTRAVENOUS | Status: AC | PRN
  Administered 2012-04-17: 17.4 via INTRAVENOUS

## 2012-04-17 MED ORDER — DOCETAXEL CHEMO INJECTION 160 MG/16ML
75.0000 mg/m2 | Freq: Once | INTRAVENOUS | Status: AC
  Administered 2012-04-17: 120 mg via INTRAVENOUS
  Filled 2012-04-17: qty 12

## 2012-04-17 MED ORDER — PROCHLORPERAZINE MALEATE 10 MG PO TABS: ORAL_TABLET | ORAL | Status: DC

## 2012-04-17 MED ORDER — LORAZEPAM 0.5 MG PO TABS: 0.5000 mg | ORAL_TABLET | Freq: Four times a day (QID) | ORAL | Status: AC | PRN

## 2012-04-17 MED ORDER — DEXAMETHASONE 4 MG PO TABS: ORAL_TABLET | ORAL | Status: DC

## 2012-04-17 MED ORDER — ONDANSETRON HCL 8 MG PO TABS: 8.0000 mg | ORAL_TABLET | Freq: Two times a day (BID) | ORAL | Status: AC | PRN

## 2012-04-17 MED ORDER — SODIUM CHLORIDE 0.9 % IV SOLN
Freq: Once | INTRAVENOUS | Status: AC
  Administered 2012-04-17: 14:00:00 via INTRAVENOUS

## 2012-04-17 MED ORDER — LIDOCAINE-PRILOCAINE 2.5-2.5 % EX CREA: TOPICAL_CREAM | CUTANEOUS | Status: DC

## 2012-04-17 MED ORDER — DEXAMETHASONE SODIUM PHOSPHATE 4 MG/ML IJ SOLN
20.0000 mg | Freq: Once | INTRAMUSCULAR | Status: AC
  Administered 2012-04-17: 20 mg via INTRAVENOUS

## 2012-04-17 MED ORDER — ONDANSETRON 16 MG/50ML IVPB (CHCC)
16.0000 mg | Freq: Once | INTRAVENOUS | Status: AC
  Administered 2012-04-17: 16 mg via INTRAVENOUS

## 2012-04-17 MED ORDER — SODIUM CHLORIDE 0.9 % IV SOLN
600.0000 mg/m2 | Freq: Once | INTRAVENOUS | Status: AC
  Administered 2012-04-17: 1000 mg via INTRAVENOUS
  Filled 2012-04-17: qty 50

## 2012-04-17 MED ORDER — SODIUM CHLORIDE 0.9 % IJ SOLN
10.0000 mL | INTRAMUSCULAR | Status: DC | PRN
  Administered 2012-04-17: 10 mL
  Filled 2012-04-17: qty 10

## 2012-04-17 NOTE — Patient Instructions (Signed)
[facility] Discharge Instructions for Patients Receiving Chemotherapy  Today you received the following chemotherapy agents docetaxel, cytoxan  To help prevent nausea and vomiting after your treatment, we encourage you to take your nausea medication as directed by MD Begin taking it at 8pm and take it as often as prescribed for the next 48 hours.   If you develop nausea and vomiting that is not controlled by your nausea medication, call the clinic. If it is after clinic hours your family physician or the after hours number for the clinic or go to the Emergency Department.   BELOW ARE SYMPTOMS THAT SHOULD BE REPORTED IMMEDIATELY:  *FEVER GREATER THAN 100.5 F  *CHILLS WITH OR WITHOUT FEVER  NAUSEA AND VOMITING THAT IS NOT CONTROLLED WITH YOUR NAUSEA MEDICATION  *UNUSUAL SHORTNESS OF BREATH  *UNUSUAL BRUISING OR BLEEDING  TENDERNESS IN MOUTH AND THROAT WITH OR WITHOUT PRESENCE OF ULCERS  *URINARY PROBLEMS  *BOWEL PROBLEMS  UNUSUAL RASH Items with * indicate a potential emergency and should be followed up as soon as possible.  One of the nurses will contact you 24 hours after your treatment. Please let the nurse know about any problems that you may have experienced. Feel free to call the clinic you have any questions or concerns. The clinic phone number is [phone].   I have been informed and understand all the instructions given to me. I know to contact the clinic, my physician, or go to the Emergency Department if any problems should occur. I do not have any questions at this time, but understand that I may call the clinic during office hours   should I have any questions or need assistance in obtaining follow up care.    __________________________________________  _____________  __________ Signature of Patient or Authorized Representative            Date                   Time    __________________________________________ Nurse's Signature

## 2012-04-17 NOTE — Telephone Encounter (Signed)
patient called in and confirmed new date and time on 04-23-2012 starting at 1:45pm 

## 2012-04-18 ENCOUNTER — Encounter: Payer: Self-pay | Admitting: Radiation Oncology

## 2012-04-18 ENCOUNTER — Telehealth: Payer: Self-pay | Admitting: *Deleted

## 2012-04-18 DIAGNOSIS — C50919 Malignant neoplasm of unspecified site of unspecified female breast: Secondary | ICD-10-CM | POA: Insufficient documentation

## 2012-04-18 DIAGNOSIS — Z923 Personal history of irradiation: Secondary | ICD-10-CM | POA: Insufficient documentation

## 2012-04-18 NOTE — Telephone Encounter (Signed)
Called pt. To follow up on 1st taxotere/cytoxan.   Pt. States she is "just great".  She denies nausea, vomiting, diarrhea, mucositis.  She is able to eat and drink and already has taken in 45oz of fluids by 1;30pm.  Reviewed with her how to get in touch with [place] if she has questions or problems and she very much appreciated the call.

## 2012-04-18 NOTE — Progress Notes (Signed)
66 year old. Female. Married, three children. 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   Diagnosed in 2009 with clinical stage I invasive ductal/DCIS of right breast. Status post mastectomy 04/29/2008 with reconstruction and implant. Recent MRI revealed questionable thickening the upper outer quadrant of the right breast. Underwent excision of this area on 03/28/2012. Pathology revealed invasive ductal cancer with cell morphology as a visual cancer. Margins involved and a re excision revealed no further tumor. Pathology T1bN0, triple negative breast cancer.   FA:[id number], epinephrine, iohexol PREVIOUS RADIATION THERAPY: Status post mantle and periaortic radiotherapy completed February 1988 in the management of her pathologic stage IIB nodular sclerosing Hodgkin's disease. No indication of a pacemaker

## 2012-04-19 ENCOUNTER — Ambulatory Visit
Admission: RE | Admit: 2012-04-19 | Discharge: 2012-04-19 | Disposition: A | Discharge status: Home / Self Care | Payer: Medicare Other | Source: Ambulatory Visit | Attending: Radiation Oncology | Admitting: Radiation Oncology

## 2012-04-19 ENCOUNTER — Encounter: Payer: Self-pay | Admitting: Oncology

## 2012-04-19 ENCOUNTER — Encounter: Payer: Self-pay | Admitting: Radiation Oncology

## 2012-04-19 VITALS — BP 137/78 | HR 88 | Temp 98.2°F | Resp 20 | Ht 62.0 in | Wt 139.6 lb

## 2012-04-19 DIAGNOSIS — Z9010 Acquired absence of unspecified breast and nipple: Secondary | ICD-10-CM | POA: Diagnosis not present

## 2012-04-19 DIAGNOSIS — Z510 Encounter for antineoplastic radiation therapy: Secondary | ICD-10-CM | POA: Diagnosis not present

## 2012-04-19 DIAGNOSIS — R059 Cough, unspecified: Secondary | ICD-10-CM | POA: Insufficient documentation

## 2012-04-19 DIAGNOSIS — Z8571 Personal history of Hodgkin lymphoma: Secondary | ICD-10-CM | POA: Diagnosis not present

## 2012-04-19 DIAGNOSIS — C50919 Malignant neoplasm of unspecified site of unspecified female breast: Secondary | ICD-10-CM

## 2012-04-19 DIAGNOSIS — C50419 Malignant neoplasm of upper-outer quadrant of unspecified female breast: Secondary | ICD-10-CM | POA: Diagnosis not present

## 2012-04-19 DIAGNOSIS — R05 Cough: Secondary | ICD-10-CM | POA: Insufficient documentation

## 2012-04-19 DIAGNOSIS — Z79899 Other long term (current) drug therapy: Secondary | ICD-10-CM | POA: Insufficient documentation

## 2012-04-19 DIAGNOSIS — R0602 Shortness of breath: Secondary | ICD-10-CM | POA: Diagnosis not present

## 2012-04-19 NOTE — Progress Notes (Signed)
Put aflac form on nurse's desk °

## 2012-04-19 NOTE — Progress Notes (Signed)
Followup note:  Diagnosis: Recurrent triple negative carcinoma the right breast, right chest wall  Previous radiation therapy: Status post mantle and para-aortic radiation therapy completed February 1988 for pathologic stage IIB nodular sclerosing Hodgkin's disease  History: [name] is seen today for consideration of post excision electron beam radiation therapy in the management of her recurrent triple negative carcinoma of the right breast involving her right chest wall. I last saw the patient in February 2009 at which time she presented with a T1 N0 triple negative right breast cancer rising at approximately 9 clock, 6 cm is from the nipple. She will onto have a mastectomy and implant reconstruction. Dr. Johna Sheriff noted a small nodule at approximately 10:00 in the deep subcutaneous tissue overlying the superior flap over her reconstructed chest wall. He proceed with excisional biopsy and he was found to represent a triple negative ductal carcinoma consistent with her original pathology. Her breast MR on 03/07/2012 showed a 4 x 8 x 8 mm nodular enhancement overlying the central breast of the patient's right breast prosthesis. Dr. Johna Sheriff went down to the implant he was able to obtain negative margins. Earlier this week she started adjuvant chemotherapy with Dr. Donnie Coffin expects to complete her Taxol/Cytoxan chemotherapy later this June.  Physical examination: She is alert and oriented. Wt Readings from Last 3 Encounters:  04/19/12 139 lb 9.6 oz (63.322 kg)  04/12/12 137 lb 6.4 oz (62.324 kg)  04/12/12 138 lb 6.4 oz (62.778 kg)   Temp Readings from Last 3 Encounters:  04/19/12 98.2 F (36.8 C) Oral  04/17/12 98.1 F (36.7 C)   04/16/12 97 F (36.1 C)    BP Readings from Last 3 Encounters:  04/19/12 137/78  04/17/12 142/79  04/16/12 168/85   Pulse Readings from Last 3 Encounters:  04/19/12 88  04/17/12 77  04/16/12 83   Head and neck examination: Grossly unremarkable. Nodes: There is  no palpable cervical, supraclavicular, or axillary lymphadenopathy. Chest: Lungs clear. Heart: Regular rate and rhythm. Back: Without spinal or CVA tenderness. Breast: There is a horizontal excisional biopsy wound just superior and lateral to the central aspect of her mastectomy scar. The wound is covered by Steri-Strips. No visible or palpable evidence for recurrent disease along her right chest wall/reconstructed breast. Left breast without masses or lesions. Abdomen without hepatomegaly. Extremities without edema.  Laboratory data:  Lab Results  Component Value Date   WBC 7.5 04/12/2012   HGB 12.3 04/12/2012   HCT 37.2 04/12/2012   MCV 93.2 04/12/2012   PLT 331 04/12/2012    Impression: Recurrent triple negative carcinoma of the right breast. I had the opportunity to review her set up photographs from her mantle irradiation. Most if not all of the area of recurrence appears to have been under the mantle lung blocks he received very little radiation therapy. Therefore, I feel that she should receive localized electron beam radiation therapy to her area of recurrence following completion of adjuvant chemotherapy. This should be well tolerated. I discussed the potential acute and late toxicities of radiation therapy, she'll contact me on completion of her chemotherapy for a brief followup visit electron beam simulation.  Plan: As discussed above.  30 minutes was spent face-to-face the patient, primarily counseling the patient. Consent is signed today.

## 2012-04-19 NOTE — Progress Notes (Signed)
Encounter addended by: Agnes Lawrence, RN on: 04/19/2012 10:57 AM<BR>     Documentation filed: Inpatient Patient Education, Charges VN

## 2012-04-19 NOTE — Progress Notes (Signed)
Please see the Nurse Progress Note in the MD Initial Consult Encounter for this patient. 

## 2012-04-19 NOTE — Progress Notes (Signed)
Porta cath insertion 04/16/12, 1st chemo  04/18/12, Dr Donnie Coffin.  Some tenderness on left chest ,porta cath site. Tylenol prn w/good relief.

## 2012-04-20 ENCOUNTER — Telehealth: Payer: Self-pay | Admitting: *Deleted

## 2012-04-20 NOTE — Telephone Encounter (Signed)
confirmed over the phone the new date and time on 04-27-2012 instead of 04-24-2012 [facility] did not have any opens on 04-24-2012

## 2012-04-25 ENCOUNTER — Ambulatory Visit: Payer: Medicare Other | Admitting: Nutrition

## 2012-04-25 NOTE — Assessment & Plan Note (Signed)
Ms. Renegar is a 66 year old female patient of Dr. Renelda Loma diagnosed with recurrent triple-negative breast cancer status post radiation therapy.  MEDICAL HISTORY INCLUDES:  Hypothyroidism, cardiomyopathy, atrial fibrillation, CHF, Hodgkin disease.  MEDICATIONS INCLUDE:  Os-Cal, vitamin D3, Decadron, Synthroid, Ativan, magnesium oxide, multivitamin, Zofran, Compazine.  LABS:  Reviewed.  HEIGHT:  62 inches. WEIGHT:  139.6 pounds. USUAL BODY WEIGHT:  140 pounds. BMI:  25.53.  PATIENT STATES:  She is doing much better overall.  She has had some experience with diarrhea which she has been able to control over the last week by following a low-fiber diet.  She really misses higher fiber foods but understands that her diarrhea can be aggravated by this.  She does also report a raw upper palate and has been avoiding really acidic foods and rough and crunchy foods that would irritate that.  She, in general, just wants some general nutrition advice.   NUTRITION DIAGNOSIS:  Food and nutrition related knowledge deficit related to diagnosis of recurrent breast cancer and associated treatments as evidenced by no prior need for nutrition related information.  INTERVENTION:  I have educated Ms. Getchell on the appropriateness of a primarily low-fat, plant-based diet with lean proteins.  I have educated her on strategies for eating with diarrhea and sore mouth.  I provided her with a few fact sheets along with my contact information.  I have answered her questions.  MONITORING/EVALUATION (GOALS):  The patient will tolerate plant-based diet to maintain weight throughout treatment.  NEXT VISIT:  Patient will call with questions or concerns.    ______________________________ Zenovia Jarred, RD, LDN Clinical Nutrition Specialist BN/MEDQ  D:  04/25/2012  T:  04/25/2012  Job:  [id number]

## 2012-04-27 ENCOUNTER — Encounter: Payer: Self-pay | Admitting: Physician Assistant

## 2012-04-27 ENCOUNTER — Telehealth: Payer: Self-pay | Admitting: *Deleted

## 2012-04-27 ENCOUNTER — Ambulatory Visit (HOSPITAL_BASED_OUTPATIENT_CLINIC_OR_DEPARTMENT_OTHER): Payer: Medicare Other | Admitting: Lab

## 2012-04-27 ENCOUNTER — Ambulatory Visit (HOSPITAL_COMMUNITY): Payer: Medicare Other | Admitting: Physician Assistant

## 2012-04-27 ENCOUNTER — Other Ambulatory Visit: Payer: Self-pay | Admitting: *Deleted

## 2012-04-27 ENCOUNTER — Inpatient Hospital Stay (HOSPITAL_COMMUNITY)
Admission: AD | Admit: 2012-04-27 | Discharge: 2012-04-30 | DRG: 809 | Disposition: A | Discharge status: Home / Self Care | Payer: Medicare Other | Source: Ambulatory Visit | Attending: Oncology | Admitting: Oncology

## 2012-04-27 ENCOUNTER — Inpatient Hospital Stay (HOSPITAL_COMMUNITY): Payer: Medicare Other

## 2012-04-27 VITALS — BP 89/56 | HR 80 | Temp 97.9°F | Ht 62.0 in | Wt 138.7 lb

## 2012-04-27 DIAGNOSIS — K1230 Oral mucositis (ulcerative), unspecified: Secondary | ICD-10-CM | POA: Diagnosis present

## 2012-04-27 DIAGNOSIS — I10 Essential (primary) hypertension: Secondary | ICD-10-CM | POA: Insufficient documentation

## 2012-04-27 DIAGNOSIS — E039 Hypothyroidism, unspecified: Secondary | ICD-10-CM | POA: Diagnosis present

## 2012-04-27 DIAGNOSIS — D702 Other drug-induced agranulocytosis: Secondary | ICD-10-CM | POA: Diagnosis not present

## 2012-04-27 DIAGNOSIS — I429 Cardiomyopathy, unspecified: Secondary | ICD-10-CM

## 2012-04-27 DIAGNOSIS — R5081 Fever presenting with conditions classified elsewhere: Secondary | ICD-10-CM

## 2012-04-27 DIAGNOSIS — R000 Tachycardia, unspecified: Secondary | ICD-10-CM | POA: Diagnosis present

## 2012-04-27 DIAGNOSIS — I498 Other specified cardiac arrhythmias: Secondary | ICD-10-CM | POA: Diagnosis present

## 2012-04-27 DIAGNOSIS — I447 Left bundle-branch block, unspecified: Secondary | ICD-10-CM

## 2012-04-27 DIAGNOSIS — N6310 Unspecified lump in the right breast, unspecified quadrant: Secondary | ICD-10-CM

## 2012-04-27 DIAGNOSIS — D709 Neutropenia, unspecified: Secondary | ICD-10-CM | POA: Diagnosis not present

## 2012-04-27 DIAGNOSIS — Z87898 Personal history of other specified conditions: Secondary | ICD-10-CM

## 2012-04-27 DIAGNOSIS — C50919 Malignant neoplasm of unspecified site of unspecified female breast: Secondary | ICD-10-CM

## 2012-04-27 DIAGNOSIS — I4891 Unspecified atrial fibrillation: Secondary | ICD-10-CM | POA: Diagnosis not present

## 2012-04-27 DIAGNOSIS — T451X5A Adverse effect of antineoplastic and immunosuppressive drugs, initial encounter: Secondary | ICD-10-CM | POA: Diagnosis present

## 2012-04-27 DIAGNOSIS — I959 Hypotension, unspecified: Secondary | ICD-10-CM | POA: Diagnosis not present

## 2012-04-27 DIAGNOSIS — R509 Fever, unspecified: Secondary | ICD-10-CM | POA: Diagnosis not present

## 2012-04-27 DIAGNOSIS — K121 Other forms of stomatitis: Secondary | ICD-10-CM | POA: Diagnosis present

## 2012-04-27 DIAGNOSIS — C779 Secondary and unspecified malignant neoplasm of lymph node, unspecified: Secondary | ICD-10-CM | POA: Diagnosis present

## 2012-04-27 HISTORY — DX: Oral mucositis (ulcerative), unspecified: K12.30

## 2012-04-27 LAB — CBC WITH DIFFERENTIAL/PLATELET
Basophils Absolute: 0.1 10*3/uL (ref 0.0–0.1)
Eosinophils Absolute: 0 10*3/uL (ref 0.0–0.5)
HCT: 33 % — ABNORMAL LOW (ref 34.8–46.6)
HGB: 11.1 g/dL — ABNORMAL LOW (ref 11.6–15.9)
MCV: 90.2 fL (ref 79.5–101.0)
NEUT#: 0 10*3/uL — CL (ref 1.5–6.5)
RDW: 13.5 % (ref 11.2–14.5)
lymph#: 0.9 10*3/uL (ref 0.9–3.3)

## 2012-04-27 LAB — COMPREHENSIVE METABOLIC PANEL
AST: 14 U/L (ref 0–37)
Albumin: 3.5 g/dL (ref 3.5–5.2)
Albumin: 3.2 g/dL — ABNORMAL LOW (ref 3.5–5.2)
CO2: 29 mEq/L (ref 19–32)
Calcium: 8.5 mg/dL (ref 8.4–10.5)
Chloride: 102 mEq/L (ref 96–112)
Creatinine, Ser: 0.65 mg/dL (ref 0.50–1.10)
Glucose, Bld: 126 mg/dL — ABNORMAL HIGH (ref 70–99)
Potassium: 5.1 mEq/L (ref 3.5–5.3)
Potassium: 4.6 mEq/L (ref 3.5–5.1)
Sodium: 139 mEq/L (ref 135–145)
Sodium: 138 mEq/L (ref 135–145)
Total Bilirubin: 0.2 mg/dL — ABNORMAL LOW (ref 0.3–1.2)
Total Protein: 5.4 g/dL — ABNORMAL LOW (ref 6.0–8.3)

## 2012-04-27 LAB — DIFFERENTIAL
Myelocytes: 0 %
Neutro Abs: 0.1 10*3/uL — ABNORMAL LOW (ref 1.7–7.7)
Neutrophils Relative %: 5 % — ABNORMAL LOW (ref 43–77)
Promyelocytes Absolute: 0 %
nRBC: 0 /100 WBC

## 2012-04-27 LAB — CBC
MCH: 30.4 pg (ref 26.0–34.0)
MCHC: 33.7 g/dL (ref 30.0–36.0)
Platelets: 300 10*3/uL (ref 150–400)
RDW: 13.4 % (ref 11.5–15.5)

## 2012-04-27 LAB — TECHNOLOGIST REVIEW

## 2012-04-27 MED ORDER — PROMETHAZINE HCL 25 MG PO TABS: 25.0000 mg | ORAL_TABLET | Freq: Four times a day (QID) | ORAL | Status: DC | PRN

## 2012-04-27 MED ORDER — FILGRASTIM 300 MCG/ML IJ SOLN
300.0000 ug | Freq: Every day | INTRAMUSCULAR | Status: DC
  Administered 2012-04-27 – 2012-04-28 (×2): 300 ug via SUBCUTANEOUS
  Filled 2012-04-27 (×4): qty 1

## 2012-04-27 MED ORDER — LOSARTAN POTASSIUM 50 MG PO TABS
50.0000 mg | ORAL_TABLET | Freq: Every day | ORAL | Status: DC
  Administered 2012-04-28: 50 mg via ORAL
  Filled 2012-04-27 (×2): qty 1

## 2012-04-27 MED ORDER — ACETAMINOPHEN 325 MG PO TABS
650.0000 mg | ORAL_TABLET | Freq: Four times a day (QID) | ORAL | Status: DC | PRN
  Filled 2012-04-27: qty 2

## 2012-04-27 MED ORDER — DOCUSATE SODIUM 100 MG PO CAPS
100.0000 mg | ORAL_CAPSULE | Freq: Every day | ORAL | Status: DC | PRN
  Filled 2012-04-27: qty 1

## 2012-04-27 MED ORDER — SODIUM CHLORIDE 0.9 % IV SOLN
INTRAVENOUS | Status: DC
Start: 2012-04-27 — End: 2012-04-30
  Administered 2012-04-27: via INTRAVENOUS

## 2012-04-27 MED ORDER — OXYCODONE HCL 5 MG PO TABS: 5.0000 mg | ORAL_TABLET | ORAL | Status: DC | PRN

## 2012-04-27 MED ORDER — MORPHINE SULFATE 2 MG/ML IJ SOLN: 2.0000 mg | INTRAMUSCULAR | Status: DC | PRN

## 2012-04-27 MED ORDER — FILGRASTIM 300 MCG/0.5ML IJ SOLN: 300.0000 ug | Freq: Every day | INTRAMUSCULAR | Status: DC

## 2012-04-27 MED ORDER — SODIUM CHLORIDE 0.9 % IV SOLN
500.0000 mg | Freq: Four times a day (QID) | INTRAVENOUS | Status: DC
  Administered 2012-04-27 – 2012-04-29 (×6): 500 mg via INTRAVENOUS
  Filled 2012-04-27 (×8): qty 500

## 2012-04-27 MED ORDER — CARVEDILOL 12.5 MG PO TABS
18.7500 mg | ORAL_TABLET | Freq: Two times a day (BID) | ORAL | Status: DC
  Administered 2012-04-27 – 2012-04-30 (×6): 18.75 mg via ORAL
  Filled 2012-04-27 (×10): qty 1.5

## 2012-04-27 MED ORDER — SODIUM CHLORIDE 0.9 % IJ SOLN
3.0000 mL | Freq: Two times a day (BID) | INTRAMUSCULAR | Status: DC
  Administered 2012-04-28: 3 mL via INTRAVENOUS

## 2012-04-27 MED ORDER — SODIUM CHLORIDE 0.9 % IJ SOLN
10.0000 mL | INTRAMUSCULAR | Status: DC | PRN
  Administered 2012-04-27: 40 mL
  Administered 2012-04-28: 30 mL

## 2012-04-27 MED ORDER — LEVOTHYROXINE SODIUM 125 MCG PO TABS
125.0000 ug | ORAL_TABLET | Freq: Every day | ORAL | Status: DC
  Administered 2012-04-28 – 2012-04-30 (×3): 125 ug via ORAL
  Filled 2012-04-27 (×5): qty 1

## 2012-04-27 MED ORDER — ACETAMINOPHEN 650 MG RE SUPP: 650.0000 mg | Freq: Four times a day (QID) | RECTAL | Status: DC | PRN

## 2012-04-27 NOTE — Progress Notes (Signed)
Patient admitted to [facility] telemetry for febrile neutropenia and hypotension.-CTS

## 2012-04-27 NOTE — Progress Notes (Signed)
Left chest power pac accessed with power hpn on first attempt with no blood return. Flushed with 40ml ns to attempt to get blood return. PAC  flushes well.  Felt hpn hit back of pac upon insertion so I am certain the hpn is in correct place. Bedside RN to give antibiotic. Will recheck for blood return after infusion complete. If unable to get blood return then, will order tpa per protocol. Pt instructed to let bedside RN know if any problems at all with pac while antibiotics infuse. Will reassess.

## 2012-04-27 NOTE — H&P (Signed)
[name] is an 66 y.o. female.   Chief Complaint: [name] is a 66 year old [place] woman who presented to the [facility] today for assessment following her first of 4 planned doses of every three-week adjuvant Taxotere/Cytoxan for her history of recurrent triple-negative breast carcinoma, stage TIb, N0. She also has underlying history of cardiomyopathy secondary to prior radiation therapy receive for her history of Hodgkin's disease. She was noted to be hypotensive, severely neutropenic, with reported fever of 100.8 this morning. HPI: As noted above, [name] is a 66 year old [place] woman who presented to the [facility] today for assessment following her first of 4 planned doses of every three-week adjuvant Taxotere/Cytoxan given for recurrent triple-negative breast carcinoma. Of note, patient has a history of atrial fibrillation, left bundle-branch block, and secondary cardiomyopathy from prior radiation therapy due to her remote history of Hodgkin's disease. She states that she did okay with her initial treatment, but that on 04/20/2012, she developed obvious palpitations, "bounding heart rate", it was noted to be hypertensive with a blood pressure of 170/90. She contacted Dr. Mayford Knife who was on-call for her cardiologist Dr. Eden Emms, at that time, she states that over the next hour, her heart rate settled, and she declined normotensive. She's had a couple episodes of reported hypotension, she does check her blood pressure at home closely. This morning, she awoke and noted a temp of 100.8. She took 2 extra strength Tylenol, and upon presentation to our office today was noted to be afebrile, but hypotensive with a blood pressure of 89/56. Her total white count was 1.9 with an ANC of 0.0. She reported that she "felt like she was hit by a truck". Fortunately, she has had no nausea or emesis, she has been staying well hydrated and urinating well.  She denies any frank constipation or diarrhea. She is fatigued and feels weak. Her case was reviewed with Dr. Welton Flakes who is covering for Dr. Pierce Crane in his absence, and it was felt prudent for hospitalization to include monitoring on the telemetry unit, IV antibiotic coverage after pan cultures being obtained. Past Medical History  Diagnosis Date  . Atrial fibrillation   . Other left bundle branch block   . Secondary cardiomyopathy, unspecified   . Unspecified essential hypertension   . Unspecified hypothyroidism   . Carcinoma in situ of breast   . Raynaud's disease   . Bundle branch block     left  . Pneumonia     hx of   . Anemia   . Blood transfusion     1968  . Arthritis     fingers   . Hodgkin's disease     stage 2b  . Breast cancer 2009    T1N0M0 DCIS right breast  . Breast cancer 2013    reocurrence right breast  . Status post radiation therapy 1988    mantle and periaortic     Past Surgical History  Procedure Date  . Tonsillectomy and adenoidectomy 1965  . Thoractomy 1968  . Btl 1971  . R vein ligation & stripping 1976  . Spleenectomy 1988    for Hodgkin's disease  . Breast lumpectomy 2009    right, lymph node biopsy  . Mastectomy 2009    right breast  . Polypectomy 1990    D&C (also in 1977 and 1991)   . Dilation and curettage of uterus   . Tissue expander placement     right breast  .  Tissue expander  removal w/ replacement of implant   . Mass excision 03/28/2012    Procedure: EXCISION MASS;  Surgeon: Mariella Saa, MD;  Location: [facility];  Service: General;  Laterality: Right;  Excision right chest wall mass  . Portacath placement 04/16/2012    Procedure: INSERTION PORT-A-CATH;  Surgeon: Mariella Saa, MD;  Location: [facility];  Service: General;  Laterality: Left;  Placement of Port-a-Cath, left subclavian    Family History  Problem Relation Age of Onset  . Cancer Neg Hx   . Hypertension Mother    Social History:   reports that she has never smoked. She has never used smokeless tobacco. She reports that she does not drink alcohol or use illicit drugs.  Allergies:  Allergies  Allergen Reactions  . Benazepril     cough  . Epinephrine     REACTION: BUZZ  . Iohexol      Desc: SNEEZING      (Not in a hospital admission)  Results for orders placed in visit on 04/27/12 (from the past 48 hour(s))  CBC WITH DIFFERENTIAL     Status: Abnormal   Collection Time   04/27/12  2:19 PM      Component Value Range Comment   WBC 1.9 (*) 3.9 - 10.3 (10e3/uL)    NEUT# 0.0 (*) 1.5 - 6.5 (10e3/uL)    HGB 11.1 (*) 11.6 - 15.9 (g/dL)    HCT 16.1 (*) 09.6 - 46.6 (%)    Platelets 284  145 - 400 (10e3/uL)    MCV 90.2  79.5 - 101.0 (fL)    MCH 30.3  25.1 - 34.0 (pg)    MCHC 33.6  31.5 - 36.0 (g/dL)    RBC 0.45 (*) 4.09 - 5.45 (10e6/uL)    RDW 13.5  11.2 - 14.5 (%)    lymph# 0.9  0.9 - 3.3 (10e3/uL)    MONO# 0.9  0.1 - 0.9 (10e3/uL)    Eosinophils Absolute 0.0  0.0 - 0.5 (10e3/uL)    Basophils Absolute 0.1  0.0 - 0.1 (10e3/uL)    NEUT% 1.1 (*) 38.4 - 76.8 (%)    LYMPH% 46.4  14.0 - 49.7 (%)    MONO% 47.9 (*) 0.0 - 14.0 (%)    EOS% 1.5  0.0 - 7.0 (%)    BASO% 3.1 (*) 0.0 - 2.0 (%)    nRBC 12 (*) 0 - 0 (%)   TECHNOLOGIST REVIEW     Status: Normal   Collection Time   04/27/12  2:19 PM      Component Value Range Comment   Technologist Review Large & giant platelets      No results found.  Review of Systems  Constitutional: Positive for fever, chills and malaise/fatigue.  HENT: Negative.   Eyes: Negative.   Respiratory: Negative.   Cardiovascular: Positive for palpitations. Negative for chest pain, orthopnea, claudication, leg swelling and PND.  Gastrointestinal: Negative.   Genitourinary: Positive for frequency.  Musculoskeletal: Positive for back pain.  Skin: Positive for itching and rash.  Neurological: Positive for weakness. Negative for dizziness, tingling, tremors, sensory change, speech change,  focal weakness, seizures and loss of consciousness.  Endo/Heme/Allergies: Negative.   Psychiatric/Behavioral: Negative.     Blood pressure 89/56, pulse 80, temperature 97.9 F (36.6 C), height 5\' 2"  (1.575 m), weight 138 lb 11.2 oz (62.914 kg), last menstrual period 03/08/1995. Physical Exam  Constitutional: She is oriented to person, place, and time. She appears well-developed and well-nourished. No  distress.  HENT:  Head: Normocephalic and atraumatic.  Mouth/Throat: Oropharynx is clear and moist. No oropharyngeal exudate.  Eyes: Conjunctivae are normal. Pupils are equal, round, and reactive to light. No scleral icterus.  Neck: Normal range of motion. Neck supple. No JVD present.  Cardiovascular: Normal rate, regular rhythm and normal heart sounds.  Exam reveals no gallop and no friction rub.   No murmur heard. Respiratory: Effort normal and breath sounds normal. No stridor. No respiratory distress. She has no wheezes. She has no rales. She exhibits no tenderness.  GI: Soft. Bowel sounds are normal. She exhibits no distension and no mass. There is no tenderness. There is no rebound and no guarding.  Musculoskeletal: Normal range of motion.  Lymphadenopathy:    She has no cervical adenopathy.  Neurological: She is alert and oriented to person, place, and time.  Skin: Skin is warm and dry. She is not diaphoretic.  Psychiatric: She has a normal mood and affect. Her behavior is normal. Judgment and thought content normal.     Assessment/Plan A 66 year old [place] woman with recurrent triple-negative breast carcinoma currently day 10 cycle 1 of 4 planned adjuvant every 3 week Taxotere/Cytoxan. 2. Severe neutropenia, with febrile episode reported this morning,  3. Hypotension in a patient with history of secondary cardiomyopathy, left bundle branch block, atrial fibrillation.   [name] will be admitted to [facility], telemetry unit for monitoring, along with initiation of  Primaxin 500 mg every 6 hours after blood cultures x2 (one via port, the second peripheral), urinalysis with urine sent for culture, and chest x-ray had been obtained.  We will also initiate Neupogen 300 mcg daily starting this evening. CBCs and chemistries will be monitored each morning.  IV fluid support will be minimal, since she is staying well hydrated. We will also obtain an inpatient cardiology consultation for close monitoring on this complicated cardiac patient.   Jearl Soto T 04/27/2012, 4:34 PM

## 2012-04-27 NOTE — Telephone Encounter (Signed)
Temp 100.8 today and she awakened just feeling "dragy" . No obvious s/s of infection except stuffy nose. Took 500 mg tylenol and temp down to 99.4. 1st chemo Taxotere/Cytoxan was 04/17/12 with no Neulasta given afterwards. Scheduled to see midlevel today at 3:15pm. Instructed her to come at scheduled time unless she is called otherwise. Call if her temp goes over 101.

## 2012-04-28 ENCOUNTER — Other Ambulatory Visit: Payer: Self-pay

## 2012-04-28 ENCOUNTER — Encounter (HOSPITAL_COMMUNITY): Payer: Self-pay | Admitting: *Deleted

## 2012-04-28 DIAGNOSIS — I4891 Unspecified atrial fibrillation: Secondary | ICD-10-CM

## 2012-04-28 DIAGNOSIS — D709 Neutropenia, unspecified: Secondary | ICD-10-CM

## 2012-04-28 DIAGNOSIS — C50919 Malignant neoplasm of unspecified site of unspecified female breast: Secondary | ICD-10-CM

## 2012-04-28 DIAGNOSIS — I498 Other specified cardiac arrhythmias: Secondary | ICD-10-CM

## 2012-04-28 DIAGNOSIS — R5081 Fever presenting with conditions classified elsewhere: Secondary | ICD-10-CM

## 2012-04-28 LAB — CBC
MCV: 91.3 fL (ref 78.0–100.0)
Platelets: 296 10*3/uL (ref 150–400)
RDW: 13.5 % (ref 11.5–15.5)
WBC: 3.6 10*3/uL — ABNORMAL LOW (ref 4.0–10.5)

## 2012-04-28 LAB — COMPREHENSIVE METABOLIC PANEL
Alkaline Phosphatase: 82 U/L (ref 39–117)
BUN: 10 mg/dL (ref 6–23)
GFR calc Af Amer: >90 mL/min (ref >90)
Glucose, Bld: 96 mg/dL (ref 70–99)
Potassium: 4.4 mEq/L (ref 3.5–5.1)
Total Bilirubin: 0.2 mg/dL — ABNORMAL LOW (ref 0.3–1.2)
Total Protein: 5.9 g/dL — ABNORMAL LOW (ref 6.0–8.3)

## 2012-04-28 LAB — DIFFERENTIAL
Basophils Relative: 3 % — ABNORMAL HIGH (ref 0–1)
Eosinophils Relative: 1 % (ref 0–5)
Lymphs Abs: 1.2 10*3/uL (ref 0.7–4.0)
Monocytes Absolute: 1.9 10*3/uL — ABNORMAL HIGH (ref 0.1–1.0)
Monocytes Relative: 52 % — ABNORMAL HIGH (ref 3–12)
Neutrophils Relative %: 12 % — ABNORMAL LOW (ref 43–77)

## 2012-04-28 MED ORDER — ALTEPLASE 2 MG IJ SOLR
2.0000 mg | Freq: Once | INTRAMUSCULAR | Status: AC
  Administered 2012-04-28: 2 mg
  Filled 2012-04-28: qty 2

## 2012-04-28 MED ORDER — MAGNESIUM OXIDE 400 (241.3 MG) MG PO TABS
400.0000 mg | ORAL_TABLET | Freq: Two times a day (BID) | ORAL | Status: DC
  Administered 2012-04-28 – 2012-04-30 (×5): 400 mg via ORAL
  Filled 2012-04-28 (×6): qty 1

## 2012-04-28 NOTE — Consult Note (Signed)
CONSULT NOTE  Date: 04/28/2012               Patient Name:  [name] MRN: [id number]  DOB: 22-Sep-1946 Age / Sex: 66 y.o., female        PCP: Thora Lance, MD Primary Cardiologist: Charlton Haws, MD            Referring Physician: Sharyl Nimrod              Reason for Consult: Tachycardia           History of Present Illness: Patient is a 66 y.o. female with a PMHx of cardiomyopathy, who was admitted to [facility] on 04/27/2012 for evaluation of neutropenia and palpitations.  She has a history of a cardiomyopathy but her last ejection fraction was 74% by Myoview study.  She's not having episodes of chest pain or shortness breath. She walks on a regular basis. She was seen in the oncology clinic yesterday for evaluation of fever. We are consulted to see her for cardiology issues.  Medications: Outpatient medications: Prescriptions prior to admission  Medication Sig Dispense Refill  . acetaminophen (TYLENOL) 500 MG tablet Take 500 mg by mouth every 6 (six) hours as needed.      . calcium carbonate (OS-CAL) 600 MG TABS Take 600 mg by mouth 2 (two) times daily with a meal.       . carvedilol (COREG) 12.5 MG tablet Take 18.75 mg by mouth 2 (two) times daily.       . Cholecalciferol (VITAMIN D3) 3000 UNITS TABS Take 3,000 Units by mouth daily.      Marland Kitchen dexamethasone (DECADRON) 4 MG tablet Take 2 tabs po BID the day before, the day of, and 2 days after tx  60 tablet  1  . levothyroxine (SYNTHROID, LEVOTHROID) 125 MCG tablet Take 125 mcg by mouth daily before breakfast.       . lidocaine-prilocaine (EMLA) cream Apply to port site 1 to 3 hours prior to use  30 g  1  . LORazepam (ATIVAN) 0.5 MG tablet Take 1 tablet (0.5 mg total) by mouth every 6 (six) hours as needed for anxiety.  30 tablet  0  . losartan (COZAAR) 50 MG tablet Take 50 mg by mouth daily before breakfast.      . magnesium oxide (MAG-OX 400) 400 MG tablet Take 400 mg by mouth 2 (two) times daily.       . Multiple  Vitamins-Minerals (MULTIVITAL) tablet Take 2 tablets by mouth daily.       . naproxen sodium (ANAPROX) 220 MG tablet Take 220 mg by mouth 2 (two) times daily as needed. Pain       . OVER THE COUNTER MEDICATION Take 1 tablet by mouth 2 (two) times daily. osteodenx supplement       . OVER THE COUNTER MEDICATION Take 3 tablets by mouth daily at 12 noon. Vegetarian omega green supplement       . OVER THE COUNTER MEDICATION Take 2 capsules by mouth every morning. mental clarity supplement       . prochlorperazine (COMPAZINE) 10 MG tablet 1 tab po every 6 to 8 hours as needed for breakthrough nausea  30 tablet  0  . HYDROcodone-acetaminophen (NORCO) 5-325 MG per tablet Take 1-2 tablets by mouth every 4 (four) hours as needed for pain.  25 tablet  1    Current medications: Current Facility-Administered Medications  Medication Dose Route Frequency Provider Last Rate Last Dose  . 0.9 %  sodium chloride infusion   Intravenous Continuous Amada Kingfisher, PA 50 mL/hr at 04/27/12 2354    . acetaminophen (TYLENOL) tablet 650 mg  650 mg Oral Q6H PRN Amada Kingfisher, PA       Or  . acetaminophen (TYLENOL) suppository 650 mg  650 mg Rectal Q6H PRN Amada Kingfisher, PA      . alteplase (CATHFLO ACTIVASE) injection 2 mg  2 mg Intracatheter Once Victorino December, MD   2 mg at 04/28/12 0232  . carvedilol (COREG) tablet 18.75 mg  18.75 mg Oral BID WC Amada Kingfisher, PA   18.75 mg at 04/28/12 0733  . docusate sodium (COLACE) capsule 100 mg  100 mg Oral Daily PRN Amada Kingfisher, PA      . filgrastim (NEUPOGEN) injection 300 mcg  300 mcg Subcutaneous q1800 Victorino December, MD   300 mcg at 04/27/12 2123  . imipenem-cilastatin (PRIMAXIN) 500 mg in sodium chloride 0.9 % 100 mL IVPB  500 mg Intravenous Q6H Amada Kingfisher, PA   500 mg at 04/28/12 0603  . levothyroxine (SYNTHROID, LEVOTHROID) tablet 125 mcg  125 mcg Oral QAC breakfast Amada Kingfisher, [place]   125 mcg at 04/28/12 4098  . losartan  (COZAAR) tablet 50 mg  50 mg Oral Daily Amada Kingfisher, PA      . morphine 2 MG/ML injection 2 mg  2 mg Intravenous Q1H PRN Amada Kingfisher, PA      . oxyCODONE (Oxy IR/ROXICODONE) immediate release tablet 5 mg  5 mg Oral Q3H PRN Amada Kingfisher, PA      . promethazine (PHENERGAN) tablet 25 mg  25 mg Oral Q6H PRN Amada Kingfisher, PA      . sodium chloride 0.9 % injection 10-40 mL  10-40 mL Intracatheter PRN Victorino December, MD   30 mL at 04/28/12 0525  . sodium chloride 0.9 % injection 3 mL  3 mL Intravenous Q12H Amada Kingfisher, PA      . DISCONTD: filgrastim (NEUPOGEN) injection 300 mcg  300 mcg Subcutaneous Daily Amada Kingfisher, PA         Allergies  Allergen Reactions  . Benazepril     cough  . Epinephrine     REACTION: BUZZ  . Iohexol      Desc: SNEEZING      Past Medical History  Diagnosis Date  . Atrial fibrillation   . Other left bundle branch block   . Secondary cardiomyopathy, unspecified   . Unspecified essential hypertension   . Unspecified hypothyroidism   . Carcinoma in situ of breast   . Raynaud's disease   . Bundle branch block     left  . Pneumonia     hx of   . Anemia   . Blood transfusion     1968  . Arthritis     fingers   . Hodgkin's disease     stage 2b  . Breast cancer 2009    T1N0M0 DCIS right breast  . Breast cancer 2013    reocurrence right breast  . Status post radiation therapy 1988    mantle and periaortic     Past Surgical History  Procedure Date  . Tonsillectomy and adenoidectomy 1965  . Thoractomy 1968  . Btl 1971  . R vein ligation & stripping 1976  . Spleenectomy 1988    for Hodgkin's disease  . Breast lumpectomy 2009    right, lymph node biopsy  .  Mastectomy 2009    right breast  . Polypectomy 1990    D&C (also in 1977 and 1991)   . Dilation and curettage of uterus   . Tissue expander placement     right breast  . Tissue expander  removal w/ replacement of implant   . Mass excision 03/28/2012      Procedure: EXCISION MASS;  Surgeon: Mariella Saa, MD;  Location: [facility];  Service: General;  Laterality: Right;  Excision right chest wall mass  . Portacath placement 04/16/2012    Procedure: INSERTION PORT-A-CATH;  Surgeon: Mariella Saa, MD;  Location: [facility];  Service: General;  Laterality: Left;  Placement of Port-a-Cath, left subclavian    Family History  Problem Relation Age of Onset  . Cancer Neg Hx   . Hypertension Mother     Social History:  reports that she has never smoked. She has never used smokeless tobacco. She reports that she does not drink alcohol or use illicit drugs.   Review of Systems: Constitutional:  admits to fever,   HEENT: denies photophobia, eye pain, redness, hearing loss, ear pain, congestion, sore throat, rhinorrhea, sneezing, neck pain, neck stiffness and tinnitus.  Respiratory: denies SOB, DOE, cough, chest tightness, and wheezing.  Cardiovascular: denies chest pain, palpitations and leg swelling.  Gastrointestinal: denies nausea, vomiting, abdominal pain, diarrhea, constipation, blood in stool.  Genitourinary: denies dysuria, urgency, frequency, hematuria, flank pain and difficulty urinating.  Musculoskeletal: denies  myalgias, back pain, joint swelling, arthralgias and gait problem.   Skin: denies pallor, rash and wound.  Neurological: denies dizziness, seizures, syncope, weakness, light-headedness, numbness and headaches.   Hematological: denies adenopathy, easy bruising, personal or family bleeding history.  Psychiatric/ Behavioral: denies suicidal ideation, mood changes, confusion, nervousness, sleep disturbance and agitation.    Physical Exam: BP 113/60  Pulse 92  Temp(Src) 99.3 F (37.4 C) (Oral)  Resp 18  Ht 5\' 2"  (1.575 m)  Wt 137 lb 2 oz (62.2 kg)  BMI 25.08 kg/m2  SpO2 95%  LMP 03/08/1995  General: Vital signs reviewed and noted. Well-developed, well-nourished, in no acute distress; alert,  appropriate and cooperative throughout examination.  Head: Normocephalic, atraumatic, sclera anicteric, mucus membranes are moist  Neck: Supple. Negative for carotid bruits. JVD not elevated.  Lungs:  Clear bilaterally to auscultation without wheezes, rales, or rhonchi. Breathing is unlabored.  Heart: RRR with S1 S2. No murmurs, rubs, or gallops appreciated.  Abdomen:  Soft, non-tender, non-distended with normoactive bowel sounds. No hepatomegaly. No rebound/guarding. No obvious abdominal masses  MSK: Strength and the appear normal for age.  Extremities: No clubbing or cyanosis. No edema.  Distal pedal pulses are 2+ and equal bilaterally.  Neurologic: Alert and oriented X 3. Moves all extremities spontaneously.  Psych: Responds to questions appropriately with a normal affect.    Lab results: Basic Metabolic Panel:  Lab 04/28/12 6295 04/27/12 1805 04/27/12 1419  NA 139 138 139  K 4.4 4.6 5.1  CL 104 102 104  CO2 25 29 29   GLUCOSE 96 111* 126*  BUN 10 12 15   CREATININE 0.60 0.65 0.76  CALCIUM 8.5 8.4 8.5  MG -- -- --  PHOS -- -- --    Liver Function Tests:  Lab 04/28/12 0545 04/27/12 1805 04/27/12 1419  AST 13 14 15   ALT 16 18 17   ALKPHOS 82 89 91  BILITOT 0.2* 0.2* 0.2*  PROT 5.9* 5.9* 5.4*  ALBUMIN 3.0* 3.2* 3.5   No results found for this  basename: LIPASE:3,AMYLASE:3 in the last 168 hours No results found for this basename: AMMONIA:3 in the last 168 hours  CBC:  Lab 04/28/12 0545 04/27/12 1805 04/27/12 1419  WBC 3.6* 2.1* 1.9*  NEUTROABS -- 0.1* 0.0*  HGB 11.5* 11.2* 11.1*  HCT 33.7* 33.2* 33.0*  MCV 91.3 90.0 90.2  PLT 296 300 284    Cardiac Enzymes: No results found for this basename: CKTOTAL:5,CKMB:5,CKMBINDEX:5,TROPONINI:5 in the last 168 hours  BNP: No components found with this basename: POCBNP:3  CBG: No results found for this basename: GLUCAP:5 in the last 168 hours  Coagulation Studies: No results found for this basename: LABPROT:3,INR:3 in the  last 72 hours    Tele:  NSR / sinus tach   Imaging: X-ray Chest Pa And Lateral   04/27/2012  *RADIOLOGY REPORT*  Clinical Data: Fever, neutropenia  CHEST - 2 VIEW  Comparison: 04/16/2012  Findings: Cardiomediastinal silhouette is stable.  Left Port-A-Cath with tip in SVC again noted.  Stable chronic pleural thickening left lower lobe laterally and left apex. Stable osteopenia and mild degenerative changes thoracic spine.  No acute infiltrate or pulmonary edema.  IMPRESSION: No active disease.  No significant change.  Original Report Authenticated By: Natasha Mead, M.D.      Assessment & Plan:  1. Sinus tachycardia:  This is likely related to her fever. She recently turned back on chemotherapy for her breast cancer. Her ejection fraction is now 74% and the issue of cardiomyopathy has resolved.  She will not need any specific therapy for her sinus tachycardia. She's been evaluated by the oncologist for her fever.  I presume her Mag-Ox 400 milligrams twice a day which is one of her home medications.  We'll get an EKG this morning.    Vesta Mixer, Montez Hageman., MD, [facility] 04/28/2012, 7:59 AM

## 2012-04-28 NOTE — Progress Notes (Signed)
TPA  instilled into left chest pac per protocol. Able to pull back/waste 3ml blood tinged fluid to remove tpa. PAC flushed with 30ml ns. Still unable to draw blood from pac. Suggested another dose of tpa, or fluoro study since this is a new pac. Bedside RN to notify MD for futher orders.

## 2012-04-28 NOTE — Progress Notes (Signed)
Progress Note:  Subjective: Cardiology input appreciated. No active cardiac issues at this time. Transient tachycardia likely secondary to fever. She has a remote history of localized Hodgkin's lymphoma treated with primary radiation in the past and developed a subsequent cardiomyopathy. She was admitted yesterday with fever and neutropenia at the nadir from first cycle of chemotherapy for locally recurrent right-sided breast cancer. A dose of Neulasta was given but I don't expect it will work very quickly until her bone marrow is ready to recover. This being said, total white count is already coming up and is 3600 today compared with 2100 yesterday. A differential not done however. She appears comfortable and has no complaints. There was difficulty on attempt to draw blood from her Port-A-Cath infusion device which is on the left side. She appears comfortable. Highest temperature since admission 99.3. Currently afebrile.       Vitals: Filed Vitals:   04/28/12 0600  BP: 113/60  Pulse: 92  Temp: 99.3 F (37.4 C)  Resp: 18   Wt Readings from Last 3 Encounters:  04/28/12 137 lb 2 oz (62.2 kg)  04/27/12 138 lb 11.2 oz (62.914 kg)  04/19/12 139 lb 9.6 oz (63.322 kg)     PHYSICAL EXAM:  General well-nourished Caucasian woman Head: Normal Eyes: Normal Throat: No erythema or exudate Neck: Full range of motion Lymph Nodes: Not examined Lungs: Clear to auscultation resonant to percussion Breasts: Healed scar right breast. Right breast implant. Left subclavian Port-A-Cath. Cardiac: Regular rhythm no murmur Abdominal: Soft nontender no mass no organomegaly Extremities: No edema no calf tenderness Vascular:  No cyanosis Neurologic no focal deficit Skin: No rash or ecchymosis. Some irritation upper left chest wall from previous adhesive tape  Labs:   [facility] 04/28/12 0545 04/27/12 1805  WBC 3.6* 2.1*  HGB 11.5* 11.2*  HCT 33.7* 33.2*  PLT 296 300    Basename 04/28/12 0545  04/27/12 1805  NA 139 138  K 4.4 4.6  CL 104 102  CO2 25 29  GLUCOSE 96 111*  BUN 10 12  CREATININE 0.60 0.65  CALCIUM 8.5 8.4      Images Studies/Results:   X-ray Chest Pa And Lateral   04/27/2012  *RADIOLOGY REPORT*  Clinical Data: Fever, neutropenia  CHEST - 2 VIEW  Comparison: 04/16/2012  Findings: Cardiomediastinal silhouette is stable.  Left Port-A-Cath with tip in SVC again noted.  Stable chronic pleural thickening left lower lobe laterally and left apex. Stable osteopenia and mild degenerative changes thoracic spine.  No acute infiltrate or pulmonary edema.  IMPRESSION: No active disease.  No significant change.  Original Report Authenticated By: Natasha Mead, M.D.     Patient Active Problem List  Diagnoses  . CARCINOMA IN SITU OF BREAST  . HYPOTHYROIDISM  . HYPERTENSION, UNSPECIFIED  . CARDIOMYOPATHY, SECONDARY  . LBBB  . ATRIAL FIBRILLATION  .  Cancer of Breast T1bN0M0 triple neg S/P mastectomy/reconstruction 04/2008  . CHF (congestive heart failure)  . Carotid bruit  . Breast mass, right  . Breast cancer  . Status post radiation therapy    Assessment and Plan:  #1. Recent localized cutaneous recurrence of triple negative breast cancer initial stage I diagnosed in May 2009 status post simple mastectomy with breast implant. SubCutaneous nodule excised 03/21/2012 consistent with a metastatic triple negative breast cancer.  #2. Fever and neutropenia at nadir from first cycle of Cytoxan and Taxotere chemotherapy given April 30 and 04/18/2012. Stable on Primaxin antibiotics. Plan continue parenteral antibiotics until neutrophil recovery and then changed  to oral antibiotics and discharge if otherwise stable.  #3. History congestive cardiomyopathy related to previous radiation treatment for Hodgkin's lymphoma.  #4. History of atrial fibrillation.  #5. Hypothyroidism on replacement.      Lizzete Gough M 04/28/2012, 8:24 AM

## 2012-04-29 ENCOUNTER — Encounter (HOSPITAL_COMMUNITY): Payer: Self-pay | Admitting: Oncology

## 2012-04-29 DIAGNOSIS — E039 Hypothyroidism, unspecified: Secondary | ICD-10-CM

## 2012-04-29 DIAGNOSIS — K1230 Oral mucositis (ulcerative), unspecified: Secondary | ICD-10-CM

## 2012-04-29 DIAGNOSIS — R000 Tachycardia, unspecified: Secondary | ICD-10-CM | POA: Diagnosis present

## 2012-04-29 DIAGNOSIS — R509 Fever, unspecified: Secondary | ICD-10-CM

## 2012-04-29 HISTORY — DX: Oral mucositis (ulcerative), unspecified: K12.30

## 2012-04-29 LAB — URINE CULTURE
Colony Count: 15000
Culture  Setup Time: 201305110257

## 2012-04-29 LAB — CBC
HCT: 34.6 % — ABNORMAL LOW (ref 36.0–46.0)
HCT: 34 % — ABNORMAL LOW (ref 36.0–46.0)
Hemoglobin: 11.4 g/dL — ABNORMAL LOW (ref 12.0–15.0)
MCH: 30.2 pg (ref 26.0–34.0)
MCHC: 32.9 g/dL (ref 30.0–36.0)
MCHC: 33.5 g/dL (ref 30.0–36.0)
MCV: 91.8 fL (ref 78.0–100.0)
Platelets: 308 10*3/uL (ref 150–400)
RDW: 13.9 % (ref 11.5–15.5)
WBC: 20.2 10*3/uL — ABNORMAL HIGH (ref 4.0–10.5)
WBC: 26.4 10*3/uL — ABNORMAL HIGH (ref 4.0–10.5)

## 2012-04-29 LAB — DIFFERENTIAL
Band Neutrophils: 53 % — ABNORMAL HIGH (ref 0–10)
Blasts: 0 %
Metamyelocytes Relative: 1 %
Monocytes Absolute: 4.2 10*3/uL — ABNORMAL HIGH (ref 0.1–1.0)
Myelocytes: 0 %
Promyelocytes Absolute: 0 %
nRBC: 0 /100 WBC

## 2012-04-29 LAB — COMPREHENSIVE METABOLIC PANEL
ALT: 15 U/L (ref 0–35)
Albumin: 2.7 g/dL — ABNORMAL LOW (ref 3.5–5.2)
Alkaline Phosphatase: 86 U/L (ref 39–117)
BUN: 12 mg/dL (ref 6–23)
Chloride: 102 mEq/L (ref 96–112)
Potassium: 4.2 mEq/L (ref 3.5–5.1)
Sodium: 138 mEq/L (ref 135–145)
Total Bilirubin: 0.2 mg/dL — ABNORMAL LOW (ref 0.3–1.2)
Total Protein: 5.7 g/dL — ABNORMAL LOW (ref 6.0–8.3)

## 2012-04-29 MED ORDER — LOSARTAN POTASSIUM 25 MG PO TABS
25.0000 mg | ORAL_TABLET | Freq: Every day | ORAL | Status: DC
  Administered 2012-04-30: 25 mg via ORAL
  Filled 2012-04-29: qty 1

## 2012-04-29 MED ORDER — LEVOFLOXACIN 500 MG PO TABS
500.0000 mg | ORAL_TABLET | Freq: Every day | ORAL | Status: DC
  Administered 2012-04-29 – 2012-04-30 (×2): 500 mg via ORAL
  Filled 2012-04-29 (×2): qty 1

## 2012-04-29 MED ORDER — MAGIC MOUTHWASH W/LIDOCAINE
15.0000 mL | Freq: Four times a day (QID) | ORAL | Status: DC | PRN
  Administered 2012-04-29: 15 mL via ORAL
  Filled 2012-04-29: qty 15

## 2012-04-29 NOTE — Progress Notes (Signed)
[facility] INPATIENT PROGRESS NOTE  Name: [name]      MRN: [id number]    Location: 1502/1502-01  Date: 04/29/2012 Time:11:10 AM   Subjective: Interval History:[name] reported feeling better than 3 days ago when she came in.  She has not had any fever since admission.  She however dose have mucositis.  She denies SOB, CP, abd pain, rectal bleeding,  visible source of bleeding, cough, dysuria, hematuria, skin rash.  She denied any problem with right breast lumpectomy site or left portacath.    Objective: Vital signs in last 24 hours: Temp:  [98.9 F (37.2 C)-99.7 F (37.6 C)] 98.9 F (37.2 C) (05/12 0600) Pulse Rate:  [83-85] 83  (05/12 0600) Resp:  [16-18] 18  (05/12 0600) BP: (86-104)/(47-53) 104/47 mmHg (05/12 0600) SpO2:  [95 %-98 %] 95 % (05/12 0600) Weight:  [134 lb 14.7 oz (61.2 kg)] 134 lb 14.7 oz (61.2 kg) (05/12 0600)    Intake/Output from previous day: 05/11 0701 - 05/12 0700 In: 660 [P.O.:60; I.V.:600] Out: -     PHYSICAL EXAM:  Gen: Well-nourished, in no acute distress. Eyes: No scleral icterus or jaundice. ENT: There was no oropharyngeal lesions. Neck was supple without thyromegaly. Lymphatics: Negative for cervical, supraclavicular, axillary, or inguinal adenopathy.  Respiratory: Lungs were clear bilaterally without wheezing or crackles. Cardiovascular: normal heart rate and rhythm; S1/S2; without murmur, rubs, or gallop. There was no pedal edema. GI: Abdomen was soft, flat, nontender, nondistended, without organomegaly. Musculoskeletal exam: No spinal tenderness on palpation of vertebral spine. Skin exam was without ecchymosis, petechiae. Neuro exam was nonfocal. Patient was alert and oriented. Attention was good. Language was appropriate. Mood was normal without depression. Speech was not pressured. Thought content was not tangential.  Right breast lumpectomy scar was clean/intact without erythema, purulent discharge.  Left chest portacath  site was dry/clean/intact.       Studies/Results: Results for orders placed during the hospital encounter of 04/27/12 (from the past 48 hour(s))  CBC     Status: Abnormal   Collection Time   04/27/12  6:05 PM      Component Value Range Comment   WBC 2.1 (*) 4.0 - 10.5 (K/uL)    RBC 3.69 (*) 3.87 - 5.11 (MIL/uL)    Hemoglobin 11.2 (*) 12.0 - 15.0 (g/dL)    HCT 11.9 (*) 14.7 - 46.0 (%)    MCV 90.0  78.0 - 100.0 (fL)    MCH 30.4  26.0 - 34.0 (pg)    MCHC 33.7  30.0 - 36.0 (g/dL)    RDW 82.9  56.2 - 13.0 (%)    Platelets 300  150 - 400 (K/uL)   COMPREHENSIVE METABOLIC PANEL     Status: Abnormal   Collection Time   04/27/12  6:05 PM      Component Value Range Comment   Sodium 138  135 - 145 (mEq/L)    Potassium 4.6  3.5 - 5.1 (mEq/L)    Chloride 102  96 - 112 (mEq/L)    CO2 29  19 - 32 (mEq/L)    Glucose, Bld 111 (*) 70 - 99 (mg/dL)    BUN 12  6 - 23 (mg/dL)    Creatinine, Ser 8.65  0.50 - 1.10 (mg/dL)    Calcium 8.4  8.4 - 10.5 (mg/dL)    Total Protein 5.9 (*) 6.0 - 8.3 (g/dL)    Albumin 3.2 (*) 3.5 - 5.2 (g/dL)    AST 14  0 -  37 (U/L)    ALT 18  0 - 35 (U/L)    Alkaline Phosphatase 89  39 - 117 (U/L)    Total Bilirubin 0.2 (*) 0.3 - 1.2 (mg/dL)    GFR calc non Af Amer >90  >90 (mL/min)    GFR calc Af Amer >90  >90 (mL/min)   DIFFERENTIAL     Status: Abnormal   Collection Time   04/27/12  6:05 PM      Component Value Range Comment   Neutrophils Relative 5 (*) 43 - 77 (%)    Lymphocytes Relative 60 (*) 12 - 46 (%)    Monocytes Relative 32 (*) 3 - 12 (%)    Eosinophils Relative 1  0 - 5 (%)    Basophils Relative 1  0 - 1 (%)    Band Neutrophils 1  0 - 10 (%)    Metamyelocytes Relative 0      Myelocytes 0      Promyelocytes Absolute 0      Blasts 0      nRBC 0  0 (/100 WBC)    Neutro Abs 0.1 (*) 1.7 - 7.7 (K/uL)    Lymphs Abs 1.3  0.7 - 4.0 (K/uL)    Monocytes Absolute 0.7  0.1 - 1.0 (K/uL)    Eosinophils Absolute 0.0  0.0 - 0.7 (K/uL)    Basophils Absolute 0.0  0.0 -  0.1 (K/uL)    RBC Morphology RARE NRBCs      WBC Morphology ATYPICAL LYMPHOCYTES     CBC     Status: Abnormal   Collection Time   04/28/12  5:45 AM      Component Value Range Comment   WBC 3.6 (*) 4.0 - 10.5 (K/uL)    RBC 3.69 (*) 3.87 - 5.11 (MIL/uL)    Hemoglobin 11.5 (*) 12.0 - 15.0 (g/dL)    HCT 09.8 (*) 11.9 - 46.0 (%)    MCV 91.3  78.0 - 100.0 (fL)    MCH 31.2  26.0 - 34.0 (pg)    MCHC 34.1  30.0 - 36.0 (g/dL)    RDW 14.7  82.9 - 56.2 (%)    Platelets 296  150 - 400 (K/uL) PLATELET COUNT CONFIRMED BY SMEAR  COMPREHENSIVE METABOLIC PANEL     Status: Abnormal   Collection Time   04/28/12  5:45 AM      Component Value Range Comment   Sodium 139  135 - 145 (mEq/L)    Potassium 4.4  3.5 - 5.1 (mEq/L)    Chloride 104  96 - 112 (mEq/L)    CO2 25  19 - 32 (mEq/L)    Glucose, Bld 96  70 - 99 (mg/dL)    BUN 10  6 - 23 (mg/dL)    Creatinine, Ser 1.30  0.50 - 1.10 (mg/dL)    Calcium 8.5  8.4 - 10.5 (mg/dL)    Total Protein 5.9 (*) 6.0 - 8.3 (g/dL)    Albumin 3.0 (*) 3.5 - 5.2 (g/dL)    AST 13  0 - 37 (U/L)    ALT 16  0 - 35 (U/L)    Alkaline Phosphatase 82  39 - 117 (U/L)    Total Bilirubin 0.2 (*) 0.3 - 1.2 (mg/dL)    GFR calc non Af Amer >90  >90 (mL/min)    GFR calc Af Amer >90  >90 (mL/min)   DIFFERENTIAL     Status: Abnormal   Collection Time   04/28/12  5:45 AM  Component Value Range Comment   Neutrophils Relative 12 (*) 43 - 77 (%)    Lymphocytes Relative 32  12 - 46 (%)    Monocytes Relative 52 (*) 3 - 12 (%)    Eosinophils Relative 1  0 - 5 (%)    Basophils Relative 3 (*) 0 - 1 (%)    Neutro Abs 0.4 (*) 1.7 - 7.7 (K/uL)    Lymphs Abs 1.2  0.7 - 4.0 (K/uL)    Monocytes Absolute 1.9 (*) 0.1 - 1.0 (K/uL)    Eosinophils Absolute 0.0  0.0 - 0.7 (K/uL)    Basophils Absolute 0.1  0.0 - 0.1 (K/uL)    RBC Morphology POLYCHROMASIA PRESENT   RARE NRBCs   WBC Morphology DOHLE BODIES      Smear Review LARGE PLATELETS PRESENT     COMPREHENSIVE METABOLIC PANEL     Status:  Abnormal   Collection Time   04/29/12  5:30 AM      Component Value Range Comment   Sodium 138  135 - 145 (mEq/L)    Potassium 4.2  3.5 - 5.1 (mEq/L)    Chloride 102  96 - 112 (mEq/L)    CO2 26  19 - 32 (mEq/L)    Glucose, Bld 98  70 - 99 (mg/dL)    BUN 12  6 - 23 (mg/dL)    Creatinine, Ser 7.84  0.50 - 1.10 (mg/dL)    Calcium 9.0  8.4 - 10.5 (mg/dL)    Total Protein 5.7 (*) 6.0 - 8.3 (g/dL)    Albumin 2.7 (*) 3.5 - 5.2 (g/dL)    AST 18  0 - 37 (U/L)    ALT 15  0 - 35 (U/L)    Alkaline Phosphatase 86  39 - 117 (U/L)    Total Bilirubin 0.2 (*) 0.3 - 1.2 (mg/dL)    GFR calc non Af Amer >90  >90 (mL/min)    GFR calc Af Amer >90  >90 (mL/min)   CBC     Status: Abnormal   Collection Time   04/29/12  5:30 AM      Component Value Range Comment   WBC 20.2 (*) 4.0 - 10.5 (K/uL)    RBC 3.77 (*) 3.87 - 5.11 (MIL/uL)    Hemoglobin 11.4 (*) 12.0 - 15.0 (g/dL)    HCT 69.6 (*) 29.5 - 46.0 (%)    MCV 91.8  78.0 - 100.0 (fL)    MCH 30.2  26.0 - 34.0 (pg)    MCHC 32.9  30.0 - 36.0 (g/dL)    RDW 28.4  13.2 - 44.0 (%)    Platelets 308  150 - 400 (K/uL)   DIFFERENTIAL     Status: Abnormal   Collection Time   04/29/12  5:30 AM      Component Value Range Comment   Neutrophils Relative 10 (*) 43 - 77 (%)    Lymphocytes Relative 13  12 - 46 (%)    Monocytes Relative 21 (*) 3 - 12 (%)    Eosinophils Relative 0  0 - 5 (%)    Basophils Relative 2 (*) 0 - 1 (%)    Band Neutrophils 53 (*) 0 - 10 (%)    Metamyelocytes Relative 1      Myelocytes 0      Promyelocytes Absolute 0      Blasts 0      nRBC 0  0 (/100 WBC)    Neutro Abs 13.0 (*) 1.7 - 7.7 (K/uL)  Lymphs Abs 2.6  0.7 - 4.0 (K/uL)    Monocytes Absolute 4.2 (*) 0.1 - 1.0 (K/uL)    Eosinophils Absolute 0.0  0.0 - 0.7 (K/uL)    Basophils Absolute 0.4 (*) 0.0 - 0.1 (K/uL)    RBC Morphology POLYCHROMASIA PRESENT      WBC Morphology TOXIC GRANULATION     CBC     Status: Abnormal   Collection Time   04/29/12  9:15 AM      Component Value  Range Comment   WBC 26.4 (*) 4.0 - 10.5 (K/uL)    RBC 3.70 (*) 3.87 - 5.11 (MIL/uL)    Hemoglobin 11.4 (*) 12.0 - 15.0 (g/dL)    HCT 91.4 (*) 78.2 - 46.0 (%)    MCV 91.9  78.0 - 100.0 (fL)    MCH 30.8  26.0 - 34.0 (pg)    MCHC 33.5  30.0 - 36.0 (g/dL)    RDW 95.6  21.3 - 08.6 (%)    Platelets 312  150 - 400 (K/uL)    X-ray Chest Pa And Lateral   04/27/2012  *RADIOLOGY REPORT*  Clinical Data: Fever, neutropenia  CHEST - 2 VIEW  Comparison: 04/16/2012  Findings: Cardiomediastinal silhouette is stable.  Left Port-A-Cath with tip in SVC again noted.  Stable chronic pleural thickening left lower lobe laterally and left apex. Stable osteopenia and mild degenerative changes thoracic spine.  No acute infiltrate or pulmonary edema.  IMPRESSION: No active disease.  No significant change.  Original Report Authenticated By: Natasha Mead, M.D.     MEDICATIONS: reviewed.     Assessment/Plan:    CARCINOMA IN SITU OF BREAST   .  HYPOTHYROIDISM   .  HYPERTENSION, UNSPECIFIED   .  CARDIOMYOPATHY, SECONDARY   .  LBBB   .  ATRIAL FIBRILLATION   .  Cancer of Breast T1bN0M0 triple neg S/P mastectomy/reconstruction 04/2008   .  CHF (congestive heart failure)   .  Carotid bruit   .  Breast mass, right   .  Breast cancer   .  Status post radiation therapy    Assessment and Plan:   #1. Recent localized cutaneous recurrence of triple negative breast cancer initial stage I diagnosed in May 2009 status post simple mastectomy with breast implant. SubCutaneous nodule excised 03/21/2012 consistent with a metastatic triple negative breast cancer.   #2. Fever and neutropenia at nadir from first cycle of Cytoxan and Taxotere chemotherapy given April 30 and 04/18/2012.  Her neutropenia has resolved with filgastrim since admission.  Since her ANC is >1.5; I will discontinue her filgastrim.  There has been on obvious source of infection.  I also discontinued empiric IV imipenem and start oral empiric Levaquin.   #3.  History congestive cardiomyopathy related to previous radiation treatment for Hodgkin's lymphoma.  She is euvolumic on exam today.  Her blood pressure has been on the low side.  Therefore, I decreased her losartan from 50mg  to 25mg  PO daily.  This will need to be increased back to 50mg  outpatient when her BP improves.  I will continue carvedilol at 18.75mg  PO BID  #4. History of atrial fibrillation.  Rate controlled today.   #5. Hypothyroidism on Synthroid.   #6.  Dispo:  Anticipate d/c home tomorrow if afebrile the next 24 hours on oral empiric Levaquin.

## 2012-04-29 NOTE — Progress Notes (Signed)
PROGRESS NOTE  Subjective:   Patient is a 66 y.o. female with a PMHx of cardiomyopathy, who was admitted to [facility] on 04/27/2012 for evaluation of neutropenia and palpitations.  She has a history of a cardiomyopathy but her last ejection fraction was 74% by Myoview study.  She's not having episodes of chest pain or shortness breath. She walks on a regular basis. She was seen in the oncology clinic yesterday for evaluation of fever. We are consulted to see her for cardiology issues.  Her fever has resolved.  HR is now 83.  She feels fine.  Objective:    Vital Signs:   Temp:  [98.9 F (37.2 C)-99.7 F (37.6 C)] 98.9 F (37.2 C) (05/12 0600) Pulse Rate:  [83-85] 83  (05/12 0600) Resp:  [16-18] 18  (05/12 0600) BP: (86-104)/(47-53) 104/47 mmHg (05/12 0600) SpO2:  [95 %-98 %] 95 % (05/12 0600) Weight:  [134 lb 14.7 oz (61.2 kg)] 134 lb 14.7 oz (61.2 kg) (05/12 0600)  Last BM Date: 04/27/12   24-hour weight change: Weight change: -2 lb 10.3 oz (-1.2 kg)  Weight trends: Filed Weights   04/27/12 2000 04/28/12 0600 04/29/12 0600  Weight: 137 lb 9.1 oz (62.4 kg) 137 lb 2 oz (62.2 kg) 134 lb 14.7 oz (61.2 kg)    Intake/Output:  05/11 0701 - 05/12 0700 In: 660 [P.O.:60; I.V.:600] Out: -      Physical Exam: BP 104/47  Pulse 83  Temp(Src) 98.9 F (37.2 C) (Oral)  Resp 18  Ht 5\' 2"  (1.575 m)  Wt 134 lb 14.7 oz (61.2 kg)  BMI 24.68 kg/m2  SpO2 95%  LMP 03/08/1995  General: Vital signs reviewed and noted. Well-developed, well-nourished, in no acute distress; alert, appropriate and cooperative throughout examination.  Head: Normocephalic, atraumatic.  Eyes: conjunctivae/corneas clear. PERRL, EOM's intact. Fundi benign.  Throat: Oropharynx nonerythematous, no exudate appreciated.   Neck: Supple. Normal carotids. No JVD  Lungs:  Clear bilaterally to auscultation without wheezes, rales, or rhonchi. Breathing is unlabored.  Heart: Regular rate,  With normal  S1 S2. No murmurs,  rubs, or gallops   Abdomen:  Soft, non-tender, non-distended with normoactive bowel sounds. No hepatomegaly. No rebound/guarding. No abdominal masses.  Extremities: No edema.  Distal pedal pulses are 2+ and equal bilaterally.  Neurologic: A&O X3, CN II - XII are grossly intact. Motor strength is 5/5 in the all 4 extremities.  Psych: Responds to questions appropriately with normal affect.    Labs: BMET:  Basename 04/29/12 0530 04/28/12 0545  NA 138 139  K 4.2 4.4  CL 102 104  CO2 26 25  GLUCOSE 98 96  BUN 12 10  CREATININE 0.61 0.60  CALCIUM 9.0 8.5  MG -- --  PHOS -- --    Liver function tests:  Basename 04/29/12 0530 04/28/12 0545  AST 18 13  ALT 15 16  ALKPHOS 86 82  BILITOT 0.2* 0.2*  PROT 5.7* 5.9*  ALBUMIN 2.7* 3.0*   No results found for this basename: LIPASE:2,AMYLASE:2 in the last 72 hours  CBC:  Basename 04/29/12 0530 04/28/12 0545  WBC 20.2* 3.6*  NEUTROABS PENDING 0.4*  HGB 11.4* 11.5*  HCT 34.6* 33.7*  MCV 91.8 91.3  PLT 308 296    Cardiac Enzymes: No results found for this basename: CKTOTAL:4,CKMB:4,TROPONINI:4 in the last 72 hours  Coagulation Studies: No results found for this basename: LABPROT:5,INR:5 in the last 72 hours  Other:   Tele:  NSR  Medications:    Infusions:    .  sodium chloride 50 mL/hr at 04/27/12 2354    Scheduled Medications:    . carvedilol  18.75 mg Oral BID WC  . filgrastim (NEUPOGEN)  SQ  300 mcg Subcutaneous q1800  . imipenem-cilastatin  500 mg Intravenous Q6H  . levothyroxine  125 mcg Oral QAC breakfast  . losartan  50 mg Oral Daily  . magnesium oxide  400 mg Oral BID  . sodium chloride  3 mL Intravenous Q12H    Assessment/ Plan:    1. Tachycardia:  Resolved.  Follow up with Dr. Eden Emms at scheduled apt. Will sign off. Call for questions.  Disposition: home when OK with Dr. Lequita Asal of Stay: 2  Vesta Mixer, Montez Hageman., MD, [facility] 04/29/2012, 7:26 AM

## 2012-04-30 ENCOUNTER — Other Ambulatory Visit: Payer: Self-pay | Admitting: *Deleted

## 2012-04-30 ENCOUNTER — Other Ambulatory Visit: Payer: Self-pay | Admitting: Physician Assistant

## 2012-04-30 ENCOUNTER — Telehealth: Payer: Self-pay | Admitting: *Deleted

## 2012-04-30 DIAGNOSIS — C50919 Malignant neoplasm of unspecified site of unspecified female breast: Secondary | ICD-10-CM

## 2012-04-30 LAB — DIFFERENTIAL
Basophils Absolute: 0 10*3/uL (ref 0.0–0.1)
Basophils Relative: 0 % (ref 0–1)
Eosinophils Absolute: 0 10*3/uL (ref 0.0–0.7)
Eosinophils Relative: 0 % (ref 0–5)
Metamyelocytes Relative: 0 %
Monocytes Absolute: 2.6 10*3/uL — ABNORMAL HIGH (ref 0.1–1.0)
Monocytes Relative: 7 % (ref 3–12)
Neutro Abs: 32.8 10*3/uL — ABNORMAL HIGH (ref 1.7–7.7)
Neutrophils Relative %: 87 % — ABNORMAL HIGH (ref 43–77)
nRBC: 0 /100 WBC

## 2012-04-30 LAB — CBC
Hemoglobin: 11.4 g/dL — ABNORMAL LOW (ref 12.0–15.0)
MCV: 91.8 fL (ref 78.0–100.0)
Platelets: 300 10*3/uL (ref 150–400)
RBC: 3.67 MIL/uL — ABNORMAL LOW (ref 3.87–5.11)
WBC: 37.7 10*3/uL — ABNORMAL HIGH (ref 4.0–10.5)

## 2012-04-30 LAB — PATHOLOGIST SMEAR REVIEW

## 2012-04-30 LAB — BASIC METABOLIC PANEL
CO2: 29 mEq/L (ref 19–32)
Calcium: 9.1 mg/dL (ref 8.4–10.5)
Creatinine, Ser: 0.68 mg/dL (ref 0.50–1.10)
Glucose, Bld: 92 mg/dL (ref 70–99)

## 2012-04-30 MED ORDER — LEVOFLOXACIN 500 MG PO TABS: 500.0000 mg | ORAL_TABLET | Freq: Every day | ORAL | Status: AC

## 2012-04-30 MED ORDER — MAGIC MOUTHWASH W/LIDOCAINE: 10.0000 mL | Freq: Four times a day (QID) | ORAL | Status: DC

## 2012-04-30 MED ORDER — MAGIC MOUTHWASH W/LIDOCAINE: 15.0000 mL | Freq: Four times a day (QID) | ORAL | Status: DC | PRN

## 2012-04-30 NOTE — Progress Notes (Signed)
Patient had TED stockings ordered but patient states she has never had any stockings given to wear during this hospital stay.  Patient discharging today so did not order her any .

## 2012-04-30 NOTE — Telephone Encounter (Signed)
made patient appointments for 05-07-2012 05-08-2012 05-09-2012 05-29-2012 05-30-2012 06-05-2012 called patient left voice mail informing the patient of the new date and time of the lab md treatment schedule

## 2012-04-30 NOTE — Telephone Encounter (Signed)
sent michelle an email to set up treatment times

## 2012-04-30 NOTE — Progress Notes (Signed)
UR completed 

## 2012-04-30 NOTE — Discharge Instructions (Signed)
1. Please resume your medications that you were on prior to admission.  2. Please the office at [phone] for your follow up appointment with Debbora Presto or Dr. Pierce Crane

## 2012-04-30 NOTE — Progress Notes (Signed)
   CARE MANAGEMENT NOTE 04/30/2012  Patient:  [name], [name]   Account Number:  [id number]  Date Initiated:  04/30/2012  Documentation initiated by:  Raiford Noble  Subjective/Objective Assessment:   pt admwith hypotension and neurtropenia     Action/Plan:   lives with husband.   Anticipated DC Date:  05/01/2012   Anticipated DC Plan:  HOME/SELF CARE  In-house referral  NA      DC Planning Services  CM consult      PAC Choice  NA   Choice offered to / List presented to:  NA   DME arranged  NA      DME agency  NA     HH arranged  NA      HH agency  NA   Status of service:  In process, will continue to follow Medicare Important Message given?   (If response is "NO", the following Medicare IM given date fields will be blank) Date Medicare IM given:   Date Additional Medicare IM given:    Discharge Disposition:    Per UR Regulation:  Reviewed for med. necessity/level of care/duration of stay  If discussed at Long Length of Stay Meetings, dates discussed:    Comments:  04-30-12 Raiford Noble, RN,BSN,CM [phone] Will cont to follow for any potential dc needs.

## 2012-04-30 NOTE — Progress Notes (Signed)
Discussed discharge instructions, questions answered and patient verbalized understanding.  Spouse to transport home

## 2012-04-30 NOTE — Discharge Summary (Signed)
Physician Discharge Summary  Patient ID: [name] MRN: [id number] DOB/AGE: [birth year] 66 y.o.  Admit date: 04/27/2012 Discharge date: 04/30/2012  Admission Diagnoses:  Discharge Diagnoses:  Active Problems:  Sinus tachycardia  Mucositis Hypotension Resolve Neutropenia resolved Breast cancer  Discharged Condition: good  Hospital Course: Patient was admitted through the cancer Center with neutropenia. During her hospitalization she was given IV antibiotics. On Sunday the antibiotics were converted to oral Levaquin. She remained afebrile. She did receive Neupogen 300 mcg on a daily basis. Her white count is about 37,000 on discharge. Patient's hypotension resolved. She was on telemetry throughout her hospitalization.  Consults: None  Significant Diagnostic Studies: labs: Patient had CBC and C. Matte performed during her hospitalization.  Treatments: IV hydration and antibiotics: Patient received Primaxin empirically and then was converted to Levaquin orally  Discharge Exam: Blood pressure 113/63, pulse 69, temperature 98.5 F (36.9 C), temperature source Oral, resp. rate 18, height 5\' 2"  (1.575 m), weight 134 lb 14.7 oz (61.2 kg), last menstrual period 03/08/1995, SpO2 96.00%. General appearance: alert, cooperative and appears stated age Resp: clear to auscultation bilaterally Cardio: irregularly irregular rhythm GI: soft, non-tender; bowel sounds normal; no masses,  no organomegaly Extremities: extremities normal, atraumatic, no cyanosis or edema  Disposition: 01-Home or Self Care  Discharge Orders    Future Appointments: Provider: Department: Dept Phone: Center:   07/10/2012 2:00 PM Mariella Saa, MD [facility] [phone] None   02/13/2013 1:30 PM Windell Hummingbird [facility] [phone] None   02/22/2013 10:00 AM Pierce Crane, MD [facility] [phone] None     Medication List  As of 04/30/2012  9:48 AM   STOP taking these medications        acetaminophen 500 MG tablet      HYDROcodone-acetaminophen 5-325 MG per tablet      LORazepam 0.5 MG tablet      MULTIVITAL tablet         TAKE these medications         calcium carbonate 600 MG Tabs   Commonly known as: OS-CAL   Take 600 mg by mouth 2 (two) times daily with a meal.      carvedilol 12.5 MG tablet   Commonly known as: COREG   Take 18.75 mg by mouth 2 (two) times daily.      dexamethasone 4 MG tablet   Commonly known as: DECADRON   Take 2 tabs po BID the day before, the day of, and 2 days after tx      levofloxacin 500 MG tablet   Commonly known as: LEVAQUIN   Take 1 tablet (500 mg total) by mouth daily.      levothyroxine 125 MCG tablet   Commonly known as: SYNTHROID, LEVOTHROID   Take 125 mcg by mouth daily before breakfast.      lidocaine-prilocaine cream   Commonly known as: EMLA   Apply to port site 1 to 3 hours prior to use      losartan 50 MG tablet   Commonly known as: COZAAR   Take 50 mg by mouth daily before breakfast.      MAG-OX 400 400 MG tablet   Generic drug: magnesium oxide   Take 400 mg by mouth 2 (two) times daily.      naproxen sodium 220 MG tablet   Commonly known as: ANAPROX   Take 220 mg by mouth 2 (two) times daily as needed. Pain        OVER THE COUNTER MEDICATION  Take 1 tablet by mouth 2 (two) times daily. osteodenx supplement        OVER THE COUNTER MEDICATION   Take 3 tablets by mouth daily at 12 noon. Vegetarian omega green supplement        OVER THE COUNTER MEDICATION   Take 2 capsules by mouth every morning. mental clarity supplement        prochlorperazine 10 MG tablet   Commonly known as: COMPAZINE   1 tab po every 6 to 8 hours as needed for breakthrough nausea      Vitamin D3 3000 UNITS Tabs   Take 3,000 Units by mouth daily.           Follow-up Information    Call Pierce Crane, MD. (If symptoms worsen)    Contact information:   [address] [place]  [address] [phone]          Signed: Drue Second, MD Medical/Oncology [facility] [phone] (beeper) [phone] (Office)  04/30/2012, 9:48 AM 04/30/2012, 9:48 AM

## 2012-05-01 ENCOUNTER — Telehealth: Payer: Self-pay | Admitting: *Deleted

## 2012-05-01 NOTE — Telephone Encounter (Signed)
Per e-mail from Laurie,I have scheduled treatment appts. Appts in computer and Jacki Cones aware.  JMW

## 2012-05-03 ENCOUNTER — Telehealth: Payer: Self-pay | Admitting: Oncology

## 2012-05-03 NOTE — Telephone Encounter (Signed)
Per pt changed time of 5/20 appts to 2:45 and 3:15 pm due to other appts. Pt also aware of 5/21 tx appt and will get new schedule when she comes in.

## 2012-05-03 NOTE — H&P (Signed)
Related encounter: Office Visit from 04/27/2012 in [facility] MEDICAL ONCOLOGY   AHLIYA GLATT is an 66 y.o. female.  Chief Complaint: Ledonna is a 66 year old [place] woman who presented to the [facility] today for assessment following her first of 4 planned doses of every three-week adjuvant Taxotere/Cytoxan for her history of recurrent triple-negative breast carcinoma, stage TIb, N0. She also has underlying history of cardiomyopathy secondary to prior radiation therapy receive for her history of Hodgkin's disease. She was noted to be hypotensive, severely neutropenic, with reported fever of 100.8 this morning.  HPI: As noted above, Maisha is a 66 year old [place] woman who presented to the [facility] today for assessment following her first of 4 planned doses of every three-week adjuvant Taxotere/Cytoxan given for recurrent triple-negative breast carcinoma. Of note, patient has a history of atrial fibrillation, left bundle-branch block, and secondary cardiomyopathy from prior radiation therapy due to her remote history of Hodgkin's disease. She states that she did okay with her initial treatment, but that on 04/20/2012, she developed obvious palpitations, "bounding heart rate", it was noted to be hypertensive with a blood pressure of 170/90. She contacted Dr. Mayford Knife who was on-call for her cardiologist Dr. Eden Emms, at that time, she states that over the next hour, her heart rate settled, and she declined normotensive. She's had a couple episodes of reported hypotension, she does check her blood pressure at home closely. This morning, she awoke and noted a temp of 100.8. She took 2 extra strength Tylenol, and upon presentation to our office today was noted to be afebrile, but hypotensive with a blood pressure of 89/56. Her total white count was 1.9 with an ANC of 0.0. She reported that she "felt like she was hit by a truck".  Fortunately, she has had no nausea or emesis, she has been staying well hydrated and urinating well. She denies any frank constipation or diarrhea. She is fatigued and feels weak. Her case was reviewed with Dr. Welton Flakes who is covering for Dr. Pierce Crane in his absence, and it was felt prudent for hospitalization to include monitoring on the telemetry unit, IV antibiotic coverage after pan cultures being obtained.    Past Medical History    Diagnosis  Date    .  Atrial fibrillation     .  Other left bundle branch block     .  Secondary cardiomyopathy, unspecified     .  Unspecified essential hypertension     .  Unspecified hypothyroidism     .  Carcinoma in situ of breast     .  Raynaud's disease     .  Bundle branch block       left    .  Pneumonia       hx of    .  Anemia     .  Blood transfusion       1968    .  Arthritis       fingers    .  Hodgkin's disease       stage 2b    .  Breast cancer  2009      T1N0M0 DCIS right breast    .  Breast cancer  2013      reocurrence right breast    .  Status post radiation therapy  1988      mantle and periaortic       Past Surgical History  Procedure  Date    .  Tonsillectomy and adenoidectomy  1965    .  Thoractomy  1968    .  Btl  1971    .  R vein ligation & stripping  1976    .  Spleenectomy  1988      for Hodgkin's disease    .  Breast lumpectomy  2009      right, lymph node biopsy    .  Mastectomy  2009      right breast    .  Polypectomy  1990      D&C (also in 1977 and 1991)    .  Dilation and curettage of uterus     .  Tissue expander placement       right breast    .  Tissue expander removal w/ replacement of implant     .  Mass excision  03/28/2012      Procedure: EXCISION MASS; Surgeon: Mariella Saa, MD; Location: [facility]; Service: General; Laterality: Right; Excision right chest wall mass    .  Portacath placement  04/16/2012      Procedure: INSERTION PORT-A-CATH; Surgeon: Mariella Saa, MD; Location: [facility]; Service: General; Laterality: Left; Placement of Port-a-Cath, left subclavian       Family History    Problem  Relation  Age of Onset    .  Cancer  Neg Hx     .  Hypertension  Mother      Social History: reports that she has never smoked. She has never used smokeless tobacco. She reports that she does not drink alcohol or use illicit drugs.  Allergies:    Allergies    Allergen  Reactions    .  Benazepril       cough    .  Epinephrine       REACTION: BUZZ    .  Iohexol       Desc: SNEEZING      (Not in a hospital admission)    Results for orders placed in visit on 04/27/12 (from the past 48 hour(s))    CBC WITH DIFFERENTIAL Status: Abnormal     Collection Time     04/27/12 2:19 PM    Component  Value  Range  Comment     WBC  1.9 (*)  3.9 - 10.3 (10e3/uL)      NEUT#  0.0 (*)  1.5 - 6.5 (10e3/uL)      HGB  11.1 (*)  11.6 - 15.9 (g/dL)      HCT  16.1 (*)  09.6 - 46.6 (%)      Platelets  284  145 - 400 (10e3/uL)      MCV  90.2  79.5 - 101.0 (fL)      MCH  30.3  25.1 - 34.0 (pg)      MCHC  33.6  31.5 - 36.0 (g/dL)      RBC  0.45 (*)  4.09 - 5.45 (10e6/uL)      RDW  13.5  11.2 - 14.5 (%)      lymph#  0.9  0.9 - 3.3 (10e3/uL)      MONO#  0.9  0.1 - 0.9 (10e3/uL)      Eosinophils Absolute  0.0  0.0 - 0.5 (10e3/uL)      Basophils Absolute  0.1  0.0 - 0.1 (10e3/uL)      NEUT%  1.1 (*)  38.4 - 76.8 (%)  LYMPH%  46.4  14.0 - 49.7 (%)      MONO%  47.9 (*)  0.0 - 14.0 (%)      EOS%  1.5  0.0 - 7.0 (%)      BASO%  3.1 (*)  0.0 - 2.0 (%)      nRBC  12 (*)  0 - 0 (%)     TECHNOLOGIST REVIEW Status: Normal     Collection Time     04/27/12 2:19 PM    Component  Value  Range  Comment     Technologist Review  Large & giant platelets       No results found.  Review of Systems  Constitutional: Positive for fever, chills and malaise/fatigue.  HENT: Negative.  Eyes: Negative.  Respiratory: Negative.  Cardiovascular: Positive for palpitations. Negative  for chest pain, orthopnea, claudication, leg swelling and PND.  Gastrointestinal: Negative.  Genitourinary: Positive for frequency.  Musculoskeletal: Positive for back pain.  Skin: Positive for itching and rash.  Neurological: Positive for weakness. Negative for dizziness, tingling, tremors, sensory change, speech change, focal weakness, seizures and loss of consciousness.  Endo/Heme/Allergies: Negative.  Psychiatric/Behavioral: Negative.   Blood pressure 89/56, pulse 80, temperature 97.9 F (36.6 C), height 5\' 2"  (1.575 m), weight 138 lb 11.2 oz (62.914 kg), last menstrual period 03/08/1995.  Physical Exam  Constitutional: She is oriented to person, place, and time. She appears well-developed and well-nourished. No distress.  HENT:  Head: Normocephalic and atraumatic.  Mouth/Throat: Oropharynx is clear and moist. No oropharyngeal exudate.  Eyes: Conjunctivae are normal. Pupils are equal, round, and reactive to light. No scleral icterus.  Neck: Normal range of motion. Neck supple. No JVD present.  Cardiovascular: Normal rate, regular rhythm and normal heart sounds. Exam reveals no gallop and no friction rub.  No murmur heard.  Respiratory: Effort normal and breath sounds normal. No stridor. No respiratory distress. She has no wheezes. She has no rales. She exhibits no tenderness.  GI: Soft. Bowel sounds are normal. She exhibits no distension and no mass. There is no tenderness. There is no rebound and no guarding.  Musculoskeletal: Normal range of motion.  Lymphadenopathy:  She has no cervical adenopathy.  Neurological: She is alert and oriented to person, place, and time.  Skin: Skin is warm and dry. She is not diaphoretic.  Psychiatric: She has a normal mood and affect. Her behavior is normal. Judgment and thought content normal.   Assessment/Plan  A 66 year old [place] woman with recurrent triple-negative breast carcinoma currently day 10 cycle 1 of 4 planned  adjuvant every 3 week Taxotere/Cytoxan.  2. Severe neutropenia, with febrile episode reported this morning,  3. Hypotension in a patient with history of secondary cardiomyopathy, left bundle branch block, atrial fibrillation.  Mrs. Pagnotta will be admitted to 5 east, telemetry unit for monitoring, along with initiation of Primaxin 500 mg every 6 hours after blood cultures x2 (one via port, the second peripheral), urinalysis with urine sent for culture, and chest x-ray had been obtained. We will also initiate Neupogen 300 mcg daily starting this evening. CBCs and chemistries will be monitored each morning. IV fluid support will be minimal, since she is staying well hydrated. We will also obtain an inpatient cardiology consultation for close monitoring on this complicated cardiac patient.   04/27/2012, 4:34 PM

## 2012-05-07 ENCOUNTER — Ambulatory Visit (HOSPITAL_BASED_OUTPATIENT_CLINIC_OR_DEPARTMENT_OTHER): Payer: Medicare Other | Admitting: Physician Assistant

## 2012-05-07 ENCOUNTER — Other Ambulatory Visit (HOSPITAL_BASED_OUTPATIENT_CLINIC_OR_DEPARTMENT_OTHER): Payer: Medicare Other | Admitting: Lab

## 2012-05-07 ENCOUNTER — Ambulatory Visit: Payer: Medicare Other | Admitting: Physician Assistant

## 2012-05-07 ENCOUNTER — Other Ambulatory Visit: Payer: Medicare Other | Admitting: Lab

## 2012-05-07 VITALS — BP 99/63 | HR 96 | Temp 98.7°F | Ht 62.0 in | Wt 140.3 lb

## 2012-05-07 DIAGNOSIS — Z170 Estrogen receptor positive status [ER+]: Secondary | ICD-10-CM | POA: Diagnosis not present

## 2012-05-07 DIAGNOSIS — C50919 Malignant neoplasm of unspecified site of unspecified female breast: Secondary | ICD-10-CM

## 2012-05-07 DIAGNOSIS — E039 Hypothyroidism, unspecified: Secondary | ICD-10-CM

## 2012-05-07 DIAGNOSIS — I4891 Unspecified atrial fibrillation: Secondary | ICD-10-CM | POA: Diagnosis not present

## 2012-05-07 DIAGNOSIS — C50419 Malignant neoplasm of upper-outer quadrant of unspecified female breast: Secondary | ICD-10-CM

## 2012-05-07 LAB — CBC WITH DIFFERENTIAL/PLATELET
BASO%: 0.4 % (ref 0.0–2.0)
Eosinophils Absolute: 0 10*3/uL (ref 0.0–0.5)
MONO#: 0.1 10*3/uL (ref 0.1–0.9)
NEUT#: 13.5 10*3/uL — ABNORMAL HIGH (ref 1.5–6.5)
Platelets: 392 10*3/uL (ref 145–400)
RBC: 3.8 10*6/uL (ref 3.70–5.45)
RDW: 13.4 % (ref 11.2–14.5)
WBC: 14.2 10*3/uL — ABNORMAL HIGH (ref 3.9–10.3)
lymph#: 0.6 10*3/uL — ABNORMAL LOW (ref 0.9–3.3)
nRBC: 0 % (ref 0–0)

## 2012-05-07 LAB — COMPREHENSIVE METABOLIC PANEL
ALT: 14 U/L (ref 0–35)
AST: 14 U/L (ref 0–37)
CO2: 31 mEq/L (ref 19–32)
Calcium: 9.8 mg/dL (ref 8.4–10.5)
Chloride: 102 mEq/L (ref 96–112)
Potassium: 4.6 mEq/L (ref 3.5–5.3)
Sodium: 141 mEq/L (ref 135–145)
Total Protein: 6.3 g/dL (ref 6.0–8.3)

## 2012-05-07 LAB — LACTATE DEHYDROGENASE: LDH: 155 U/L (ref 94–250)

## 2012-05-07 NOTE — Progress Notes (Signed)
Hematology and Oncology Follow Up Visit  [name] [id number] 1946-04-07 66 y.o. 05/07/2012    HPI: [name] is a 66 year old [place] woman with a history of a T1 B., N0 triple negative right breast carcinoma, tentatively due for day 1 cycle 2 of 4 planned adjuvant every 3 week Taxotere/Cytoxan on 05/08/2012. 2. Recent hospitalization for febrile neutropenia. 3. History of atrial fibrillation, LBBB, hypertension. 4. Remote history of Hodgkin's disease. 5. Hypothyroidism.  Interim History:    [name] is seen today in anticipation of day 1 cycle 2 of 4 planned every 3 week Taxotere/Cytoxan given in the adjuvant setting for her history of a T1b, N0 triple negative right breast carcinoma. Of note, she was hospitalized between 510-04/30/2012 for febrile neutropenia.Cultures were negative. She was discharged on Levaquin 500 mg, for which he has 2 more days to go. She is feeling much better, except for this past weekend when she had a low-grade temp of 100.1 on 5/18-05/19/2013. This morning, she reports of temp of 99.7. She is currently afebrile. She has occasional nonproductive cough but otherwise feels pretty well without dysuria, she is a bit of redness around her port site, but notes that this has been there for quite some time. She denies nausea, emesis, diarrhea or constipation. She's had no shortness of breath chest pain or palpitations. A detailed review of systems is otherwise noncontributory as noted below.  Review of Systems: Constitutional:  no weight loss, fever, night sweats, feels well and fatigued Eyes: uses glasses ENT: no complaints Cardiovascular: no chest pain or dyspnea on exertion Respiratory: positive for - cough Neurological: no TIA or stroke symptoms Dermatological: negative Gastrointestinal: no abdominal pain, change in bowel habits, or black or bloody stools Genito-Urinary: no dysuria, trouble voiding, or hematuria Hematological and Lymphatic:  negative Breast: negative Musculoskeletal: negative Remaining ROS negative.   Medications:   I have reviewed the patient's current medications.  Current Outpatient Prescriptions  Medication Sig Dispense Refill  . Alum & Mag Hydroxide-Simeth (MAGIC MOUTHWASH W/LIDOCAINE) SOLN Take 15 mLs by mouth 4 (four) times daily as needed.  60 mL  3  . calcium carbonate (OS-CAL) 600 MG TABS Take 600 mg by mouth 2 (two) times daily with a meal.       . carvedilol (COREG) 12.5 MG tablet Take 18.75 mg by mouth 2 (two) times daily.       . Cholecalciferol (VITAMIN D3) 3000 UNITS TABS Take 3,000 Units by mouth daily.      Marland Kitchen dexamethasone (DECADRON) 4 MG tablet Take 2 tabs po BID the day before, the day of, and 2 days after tx  60 tablet  1  . levofloxacin (LEVAQUIN) 500 MG tablet Take 1 tablet (500 mg total) by mouth daily.  10 tablet  0  . levothyroxine (SYNTHROID, LEVOTHROID) 125 MCG tablet Take 125 mcg by mouth daily before breakfast.       . lidocaine-prilocaine (EMLA) cream Apply to port site 1 to 3 hours prior to use  30 g  1  . losartan (COZAAR) 50 MG tablet Take 50 mg by mouth daily before breakfast.      . magnesium oxide (MAG-OX 400) 400 MG tablet Take 400 mg by mouth 2 (two) times daily.       . naproxen sodium (ANAPROX) 220 MG tablet Take 220 mg by mouth 2 (two) times daily as needed. Pain       . OVER THE COUNTER MEDICATION Take 1 tablet by mouth 2 (two) times daily. osteodenx  supplement       . OVER THE COUNTER MEDICATION Take 3 tablets by mouth daily at 12 noon. Vegetarian omega green supplement       . OVER THE COUNTER MEDICATION Take 2 capsules by mouth every morning. mental clarity supplement       . prochlorperazine (COMPAZINE) 10 MG tablet 1 tab po every 6 to 8 hours as needed for breakthrough nausea  30 tablet  0    Allergies:  Allergies  Allergen Reactions  . Benazepril     cough  . Epinephrine     REACTION: BUZZ  . Iohexol      Desc: SNEEZING     Physical Exam: Filed  Vitals:   05/07/12 1454  BP: 99/63  Pulse: 96  Temp: 98.7 F (37.1 C)    Body mass index is 25.66 kg/(m^2). Weight: 140 lbs. HEENT:  Sclerae anicteric, conjunctivae pink.  Oropharynx clear.  No mucositis or candidiasis.   Nodes:  No cervical, supraclavicular, or axillary lymphadenopathy palpated.  Breast Exam: Deferred.   Lungs:  Clear to auscultation bilaterally.  No crackles, rhonchi, or wheezes.   Heart:  Regular rate and rhythm,  slight easily blanchable erythema on the lateral aspect of the port site. Abdomen:  Soft, nontender.  Positive bowel sounds.  No organomegaly or masses palpated.   Musculoskeletal:  No focal spinal tenderness to palpation.  Extremities:  Benign.  No peripheral edema or cyanosis.   Skin:  Benign.   Neuro:  Nonfocal, alert and oriented x 3.   Lab Results: Lab Results  Component Value Date   WBC 14.2* 05/07/2012   HGB 11.7 05/07/2012   HCT 35.9 05/07/2012   MCV 94.3 05/07/2012   PLT 392 05/07/2012   NEUTROABS 13.5* 05/07/2012     Chemistry      Component Value Date/Time   NA 140 04/30/2012 0530   K 4.3 04/30/2012 0530   CL 103 04/30/2012 0530   CO2 29 04/30/2012 0530   BUN 15 04/30/2012 0530   CREATININE 0.68 04/30/2012 0530      Component Value Date/Time   CALCIUM 9.1 04/30/2012 0530   ALKPHOS 86 04/29/2012 0530   AST 18 04/29/2012 0530   ALT 15 04/29/2012 0530   BILITOT 0.2* 04/29/2012 0530      Lab Results  Component Value Date   LABCA2 32 02/17/2012    Radiological Studies: X-ray Chest Pa And Lateral  04/27/2012  *RADIOLOGY REPORT*  Clinical Data: Fever, neutropenia  CHEST - 2 VIEW  Comparison: 04/16/2012  Findings: Cardiomediastinal silhouette is stable.  Left Port-A-Cath with tip in SVC again noted.  Stable chronic pleural thickening left lower lobe laterally and left apex. Stable osteopenia and mild degenerative changes thoracic spine.  No acute infiltrate or pulmonary edema.  IMPRESSION: No active disease.  No significant change.  Original Report  Authenticated By: Natasha Mead, M.D.   Dg Chest 2 View 04/12/2012  *RADIOLOGY REPORT*  Clinical Data: 66 year old female preoperative study for Port-A- Cath.  Recurrent breast cancer.  Prior surgery and radiation.  CHEST - 2 VIEW  Comparison: 02/29/2008.  Findings: Stable lung volumes.  Stable left apical opacity and surgical clips.  Postoperative changes to the right chest wall. Stable cardiac size and mediastinal contours.  Left superior mediastinal opacity probably the sequelae of radiation.  No pneumothorax, pulmonary edema, pleural effusion or acute pulmonary opacity. Stable visualized osseous structures.  IMPRESSION: Postoperative/post therapy changes to the chest. No acute or metastatic cardiopulmonary abnormality.  Original Report Authenticated  By: H.LEE HALL III, M.D.   Nm Pet Image Restag (ps) Skull Base To Thigh 04/17/2012  *RADIOLOGY REPORT*  Clinical Data: Subsequent treatment strategy for breast cancer. Patient status post mastectomy 04/29/2008.  Recurrence of  right breast 03/28/2012.  Triple negative breast cancer  NUCLEAR MEDICINE PET SKULL BASE TO THIGH  Fasting Blood Glucose:  94  Technique:  17.4 mCi F-18 FDG was injected intravenously.   CT data was obtained and used for attenuation correction and anatomic localization only.  (This was not acquired as a diagnostic CT examination.) Additional exam technical data entered  on technologist worksheet.  Comparison: breast MRI 03/07/2012  Findings:  Neck: No hypermetabolic nodes in the neck.  Chest: There is a port in the left anterior chest wall.  Breast implant on the right.  Mild hypermetabolic activity in the skin over the implant.  No hypermetabolic right axillary nodes.  No hypermetabolic internal mammary nodes.  No hypermetabolic mediastinal nodes.  Review of lung parenchyma demonstrates volume loss of pleural thickening in the left upper lobe suggesting radiation injury.  No focal hypermetabolic activity.  No suspicious pulmonary nodules.   Abdomen / Pelvis:No abnormal hypermetabolic activity within the solid organs.  No evidence of abdominal or pelvic hypermetabolic nodes.  Skeleton:No focal hypermetabolic activity to suggest skeletal metastasis.  IMPRESSION: No evidence of breast cancer metastasis.  Original Report Authenticated By: Genevive Bi, M.D.   Dg Chest Port 1 View 04/16/2012  *RADIOLOGY REPORT*  Clinical Data: Port-A-Cath placement, also history breast carcinoma and remote history of Hodgkin's disease  PORTABLE CHEST - 1 VIEW  Comparison: Chest x-ray of 04/12/2012  Findings: A left-sided power port Port-A-Cath is present with the tip near the expected SVC - RA junction.  Chronic changes in the left hemithorax are stable with pleural thickening overlying the apex and at the left costophrenic angle.  The right lung is clear. Heart size is stable.  IMPRESSION: Left-sided Port-A-Cath tip in lower SVC. Chronic changes on the left.  Original Report Authenticated By: Juline Patch, M.D.   Dg C-arm 1-60 Min-no Report 04/16/2012  CLINICAL DATA: breast cancer   C-ARM 1-60 MINUTES  Fluoroscopy was utilized by the requesting physician.  No radiographic  interpretation.       Assessment:  Courteny is a 66 year old [place] woman with a history of a T1 B., N0 triple negative right breast carcinoma, tentatively due for day 1 cycle 2 of 4 planned adjuvant every 3 week Taxotere/Cytoxan on 05/08/2012.  2. Recent hospitalization for febrile neutropenia, after failing to present on day 2 for Neulasta following her first cycle.  3. History of atrial fibrillation, LBBB, hypertension.  4. Remote history of Hodgkin's disease.  5. Hypothyroidism.  Case reviewed with Dr. Welton Flakes in Dr. Renelda Loma absence.  Plan:  Vivika will present tomorrow for day 1 cycle 2 of every three-week Taxotere/Cytoxan scheduled. She will receive Neulasta on day 2. She was instructed to complete out her Levaquin antibiotic therapy. We will plan on seeing  her in one week's time for nadir assessment, she knows to contact us sooner if the need should arise. This plan was reviewed with the patient, who voices understanding and agreement.  She knows to call with any changes or problems.    Larry Alcock T, PA-C 05/07/2012

## 2012-05-08 ENCOUNTER — Telehealth: Payer: Self-pay | Admitting: *Deleted

## 2012-05-08 ENCOUNTER — Other Ambulatory Visit: Payer: Medicare Other | Admitting: Lab

## 2012-05-08 ENCOUNTER — Ambulatory Visit (HOSPITAL_BASED_OUTPATIENT_CLINIC_OR_DEPARTMENT_OTHER): Payer: Medicare Other

## 2012-05-08 VITALS — BP 145/79 | HR 98 | Temp 97.9°F

## 2012-05-08 DIAGNOSIS — C50419 Malignant neoplasm of upper-outer quadrant of unspecified female breast: Secondary | ICD-10-CM

## 2012-05-08 DIAGNOSIS — Z5111 Encounter for antineoplastic chemotherapy: Secondary | ICD-10-CM

## 2012-05-08 DIAGNOSIS — N6310 Unspecified lump in the right breast, unspecified quadrant: Secondary | ICD-10-CM

## 2012-05-08 DIAGNOSIS — C50919 Malignant neoplasm of unspecified site of unspecified female breast: Secondary | ICD-10-CM

## 2012-05-08 MED ORDER — DEXAMETHASONE SODIUM PHOSPHATE 4 MG/ML IJ SOLN
20.0000 mg | Freq: Once | INTRAMUSCULAR | Status: AC
  Administered 2012-05-08: 20 mg via INTRAVENOUS

## 2012-05-08 MED ORDER — SODIUM CHLORIDE 0.9 % IJ SOLN
10.0000 mL | INTRAMUSCULAR | Status: DC | PRN
  Filled 2012-05-08: qty 10

## 2012-05-08 MED ORDER — SODIUM CHLORIDE 0.9 % IV SOLN
Freq: Once | INTRAVENOUS | Status: AC
  Administered 2012-05-08: 13:00:00 via INTRAVENOUS

## 2012-05-08 MED ORDER — DOCETAXEL CHEMO INJECTION 160 MG/16ML
75.0000 mg/m2 | Freq: Once | INTRAVENOUS | Status: AC
  Administered 2012-05-08: 120 mg via INTRAVENOUS
  Filled 2012-05-08: qty 12

## 2012-05-08 MED ORDER — HEPARIN SOD (PORK) LOCK FLUSH 100 UNIT/ML IV SOLN
500.0000 [IU] | Freq: Once | INTRAVENOUS | Status: DC | PRN
  Filled 2012-05-08: qty 5

## 2012-05-08 MED ORDER — ONDANSETRON 16 MG/50ML IVPB (CHCC)
16.0000 mg | Freq: Once | INTRAVENOUS | Status: AC
  Administered 2012-05-08: 16 mg via INTRAVENOUS

## 2012-05-08 MED ORDER — SODIUM CHLORIDE 0.9 % IV SOLN
600.0000 mg/m2 | Freq: Once | INTRAVENOUS | Status: AC
  Administered 2012-05-08: 1000 mg via INTRAVENOUS
  Filled 2012-05-08: qty 50

## 2012-05-08 NOTE — Telephone Encounter (Signed)
left voice message to inform the patient of the new date and time on 05-18-2012 starting at 12:30pm

## 2012-05-09 ENCOUNTER — Inpatient Hospital Stay (HOSPITAL_COMMUNITY): Payer: Medicare Other

## 2012-05-09 ENCOUNTER — Emergency Department (HOSPITAL_COMMUNITY): Payer: Medicare Other

## 2012-05-09 ENCOUNTER — Ambulatory Visit: Payer: Medicare Other

## 2012-05-09 ENCOUNTER — Inpatient Hospital Stay (HOSPITAL_COMMUNITY)
Admission: EM | Admit: 2012-05-09 | Discharge: 2012-05-10 | DRG: 291 | Disposition: A | Discharge status: Home / Self Care | Payer: Medicare Other | Attending: Internal Medicine | Admitting: Internal Medicine

## 2012-05-09 ENCOUNTER — Other Ambulatory Visit: Payer: Self-pay

## 2012-05-09 ENCOUNTER — Encounter (HOSPITAL_COMMUNITY): Payer: Self-pay

## 2012-05-09 DIAGNOSIS — I5033 Acute on chronic diastolic (congestive) heart failure: Secondary | ICD-10-CM | POA: Diagnosis present

## 2012-05-09 DIAGNOSIS — I4891 Unspecified atrial fibrillation: Secondary | ICD-10-CM

## 2012-05-09 DIAGNOSIS — J90 Pleural effusion, not elsewhere classified: Secondary | ICD-10-CM | POA: Diagnosis not present

## 2012-05-09 DIAGNOSIS — J811 Chronic pulmonary edema: Secondary | ICD-10-CM | POA: Diagnosis not present

## 2012-05-09 DIAGNOSIS — J9819 Other pulmonary collapse: Secondary | ICD-10-CM | POA: Diagnosis not present

## 2012-05-09 DIAGNOSIS — I10 Essential (primary) hypertension: Secondary | ICD-10-CM | POA: Diagnosis present

## 2012-05-09 DIAGNOSIS — I509 Heart failure, unspecified: Secondary | ICD-10-CM | POA: Diagnosis not present

## 2012-05-09 DIAGNOSIS — D649 Anemia, unspecified: Secondary | ICD-10-CM | POA: Diagnosis present

## 2012-05-09 DIAGNOSIS — D6481 Anemia due to antineoplastic chemotherapy: Secondary | ICD-10-CM | POA: Diagnosis present

## 2012-05-09 DIAGNOSIS — T451X5A Adverse effect of antineoplastic and immunosuppressive drugs, initial encounter: Secondary | ICD-10-CM | POA: Diagnosis present

## 2012-05-09 DIAGNOSIS — E039 Hypothyroidism, unspecified: Secondary | ICD-10-CM | POA: Diagnosis not present

## 2012-05-09 DIAGNOSIS — C50919 Malignant neoplasm of unspecified site of unspecified female breast: Secondary | ICD-10-CM

## 2012-05-09 DIAGNOSIS — J189 Pneumonia, unspecified organism: Secondary | ICD-10-CM | POA: Diagnosis not present

## 2012-05-09 DIAGNOSIS — I429 Cardiomyopathy, unspecified: Secondary | ICD-10-CM | POA: Diagnosis present

## 2012-05-09 DIAGNOSIS — I5023 Acute on chronic systolic (congestive) heart failure: Secondary | ICD-10-CM

## 2012-05-09 DIAGNOSIS — K1230 Oral mucositis (ulcerative), unspecified: Secondary | ICD-10-CM

## 2012-05-09 DIAGNOSIS — R112 Nausea with vomiting, unspecified: Secondary | ICD-10-CM

## 2012-05-09 DIAGNOSIS — R918 Other nonspecific abnormal finding of lung field: Secondary | ICD-10-CM | POA: Diagnosis not present

## 2012-05-09 DIAGNOSIS — R0600 Dyspnea, unspecified: Secondary | ICD-10-CM | POA: Diagnosis present

## 2012-05-09 DIAGNOSIS — J984 Other disorders of lung: Secondary | ICD-10-CM | POA: Diagnosis not present

## 2012-05-09 DIAGNOSIS — R0602 Shortness of breath: Secondary | ICD-10-CM | POA: Diagnosis not present

## 2012-05-09 DIAGNOSIS — R0989 Other specified symptoms and signs involving the circulatory and respiratory systems: Secondary | ICD-10-CM | POA: Diagnosis not present

## 2012-05-09 DIAGNOSIS — D696 Thrombocytopenia, unspecified: Secondary | ICD-10-CM | POA: Diagnosis not present

## 2012-05-09 LAB — CBC
HCT: 33.1 % — ABNORMAL LOW (ref 36.0–46.0)
MCH: 30.1 pg (ref 26.0–34.0)
MCHC: 33.8 g/dL (ref 30.0–36.0)
RDW: 13.7 % (ref 11.5–15.5)

## 2012-05-09 LAB — BASIC METABOLIC PANEL
CO2: 24 mEq/L (ref 19–32)
Calcium: 8.9 mg/dL (ref 8.4–10.5)
Creatinine, Ser: 0.6 mg/dL (ref 0.50–1.10)
GFR calc non Af Amer: >90 mL/min (ref >90)
Glucose, Bld: 124 mg/dL — ABNORMAL HIGH (ref 70–99)
Sodium: 132 mEq/L — ABNORMAL LOW (ref 135–145)

## 2012-05-09 LAB — PRO B NATRIURETIC PEPTIDE: Pro B Natriuretic peptide (BNP): 1660 pg/mL — ABNORMAL HIGH (ref 0–125)

## 2012-05-09 MED ORDER — VANCOMYCIN HCL 1000 MG IV SOLR
750.0000 mg | Freq: Two times a day (BID) | INTRAVENOUS | Status: DC
  Administered 2012-05-09: 750 mg via INTRAVENOUS
  Filled 2012-05-09 (×2): qty 750

## 2012-05-09 MED ORDER — SODIUM CHLORIDE 0.9 % IJ SOLN: 3.0000 mL | Freq: Two times a day (BID) | INTRAMUSCULAR | Status: DC

## 2012-05-09 MED ORDER — NAPROXEN SODIUM 220 MG PO TABS: 220.0000 mg | ORAL_TABLET | Freq: Two times a day (BID) | ORAL | Status: DC | PRN

## 2012-05-09 MED ORDER — AZITHROMYCIN 250 MG PO TABS
250.0000 mg | ORAL_TABLET | Freq: Every day | ORAL | Status: DC
  Administered 2012-05-10: 250 mg via ORAL
  Filled 2012-05-09: qty 1

## 2012-05-09 MED ORDER — LEVOTHYROXINE SODIUM 125 MCG PO TABS
125.0000 ug | ORAL_TABLET | Freq: Every day | ORAL | Status: DC
  Administered 2012-05-09 – 2012-05-10 (×2): 125 ug via ORAL
  Filled 2012-05-09 (×3): qty 1

## 2012-05-09 MED ORDER — ALBUTEROL SULFATE (5 MG/ML) 0.5% IN NEBU
2.5000 mg | INHALATION_SOLUTION | Freq: Once | RESPIRATORY_TRACT | Status: AC
  Administered 2012-05-09: 2.5 mg via RESPIRATORY_TRACT
  Filled 2012-05-09: qty 0.5

## 2012-05-09 MED ORDER — FUROSEMIDE 10 MG/ML IJ SOLN
40.0000 mg | Freq: Once | INTRAMUSCULAR | Status: AC
  Administered 2012-05-09: 40 mg via INTRAVENOUS
  Filled 2012-05-09: qty 4

## 2012-05-09 MED ORDER — CALCIUM CARBONATE 1250 (500 CA) MG PO TABS
1.0000 | ORAL_TABLET | Freq: Two times a day (BID) | ORAL | Status: DC
  Administered 2012-05-09 – 2012-05-10 (×2): 500 mg via ORAL
  Filled 2012-05-09 (×4): qty 1

## 2012-05-09 MED ORDER — IOHEXOL 300 MG/ML  SOLN
100.0000 mL | Freq: Once | INTRAMUSCULAR | Status: AC | PRN
  Administered 2012-05-09: 100 mL via INTRAVENOUS

## 2012-05-09 MED ORDER — VITAMIN D3 75 MCG (3000 UT) PO TABS: 3000.0000 [IU] | ORAL_TABLET | Freq: Every day | ORAL | Status: DC

## 2012-05-09 MED ORDER — FILGRASTIM 300 MCG/ML IJ SOLN
300.0000 ug | Freq: Every day | INTRAMUSCULAR | Status: DC
  Administered 2012-05-09 – 2012-05-10 (×2): 300 ug via SUBCUTANEOUS
  Filled 2012-05-09 (×2): qty 1

## 2012-05-09 MED ORDER — ONDANSETRON HCL 4 MG PO TABS
8.0000 mg | ORAL_TABLET | Freq: Three times a day (TID) | ORAL | Status: DC | PRN
  Administered 2012-05-09 – 2012-05-10 (×2): 8 mg via ORAL
  Filled 2012-05-09 (×2): qty 2

## 2012-05-09 MED ORDER — ALUM & MAG HYDROXIDE-SIMETH 200-200-20 MG/5ML PO SUSP: 30.0000 mL | Freq: Four times a day (QID) | ORAL | Status: DC | PRN

## 2012-05-09 MED ORDER — NAPROXEN 250 MG PO TABS
250.0000 mg | ORAL_TABLET | Freq: Two times a day (BID) | ORAL | Status: DC | PRN
  Filled 2012-05-09: qty 1

## 2012-05-09 MED ORDER — PIPERACILLIN-TAZOBACTAM 3.375 G IVPB
3.3750 g | Freq: Once | INTRAVENOUS | Status: AC
  Administered 2012-05-09: 3.375 g via INTRAVENOUS
  Filled 2012-05-09: qty 50

## 2012-05-09 MED ORDER — CARVEDILOL 6.25 MG PO TABS
18.7500 mg | ORAL_TABLET | Freq: Two times a day (BID) | ORAL | Status: DC
  Administered 2012-05-09 – 2012-05-10 (×3): 18.75 mg via ORAL
  Filled 2012-05-09 (×5): qty 1

## 2012-05-09 MED ORDER — OXYCODONE HCL 5 MG PO TABS: 5.0000 mg | ORAL_TABLET | ORAL | Status: DC | PRN

## 2012-05-09 MED ORDER — AZITHROMYCIN 500 MG PO TABS
500.0000 mg | ORAL_TABLET | Freq: Every day | ORAL | Status: AC
  Administered 2012-05-09: 500 mg via ORAL
  Filled 2012-05-09: qty 1

## 2012-05-09 MED ORDER — PROCHLORPERAZINE MALEATE 10 MG PO TABS
10.0000 mg | ORAL_TABLET | Freq: Four times a day (QID) | ORAL | Status: DC | PRN
  Filled 2012-05-09: qty 1

## 2012-05-09 MED ORDER — ACETAMINOPHEN 650 MG RE SUPP: 650.0000 mg | Freq: Four times a day (QID) | RECTAL | Status: DC | PRN

## 2012-05-09 MED ORDER — DEXTROSE 5 % IV SOLN
1.0000 g | INTRAVENOUS | Status: DC
  Administered 2012-05-09 – 2012-05-10 (×2): 1 g via INTRAVENOUS
  Filled 2012-05-09 (×2): qty 10

## 2012-05-09 MED ORDER — FILGRASTIM 300 MCG/ML IJ SOLN: 300.0000 ug | Freq: Every day | INTRAMUSCULAR | Status: DC

## 2012-05-09 MED ORDER — POLYETHYLENE GLYCOL 3350 17 G PO PACK
17.0000 g | PACK | Freq: Every day | ORAL | Status: DC | PRN
  Filled 2012-05-09: qty 1

## 2012-05-09 MED ORDER — LIDOCAINE-PRILOCAINE 2.5-2.5 % EX CREA
TOPICAL_CREAM | Freq: Every day | CUTANEOUS | Status: DC | PRN
  Filled 2012-05-09: qty 5

## 2012-05-09 MED ORDER — MAGNESIUM OXIDE 400 (241.3 MG) MG PO TABS
400.0000 mg | ORAL_TABLET | Freq: Two times a day (BID) | ORAL | Status: DC
  Administered 2012-05-09 – 2012-05-10 (×3): 400 mg via ORAL
  Filled 2012-05-09 (×4): qty 1

## 2012-05-09 MED ORDER — DEXAMETHASONE 4 MG PO TABS
8.0000 mg | ORAL_TABLET | Freq: Two times a day (BID) | ORAL | Status: DC
  Administered 2012-05-09 – 2012-05-10 (×3): 8 mg via ORAL
  Filled 2012-05-09 (×5): qty 2

## 2012-05-09 MED ORDER — SODIUM CHLORIDE 0.9 % IJ SOLN: 3.0000 mL | INTRAMUSCULAR | Status: DC | PRN

## 2012-05-09 MED ORDER — SODIUM CHLORIDE 0.9 % IV SOLN: 250.0000 mL | INTRAVENOUS | Status: DC | PRN

## 2012-05-09 MED ORDER — ACETAMINOPHEN 325 MG PO TABS: 650.0000 mg | ORAL_TABLET | Freq: Four times a day (QID) | ORAL | Status: DC | PRN

## 2012-05-09 MED ORDER — VANCOMYCIN HCL IN DEXTROSE 1-5 GM/200ML-% IV SOLN
1000.0000 mg | Freq: Once | INTRAVENOUS | Status: AC
  Administered 2012-05-09: 1000 mg via INTRAVENOUS
  Filled 2012-05-09: qty 200

## 2012-05-09 MED ORDER — MAGIC MOUTHWASH W/LIDOCAINE
15.0000 mL | Freq: Four times a day (QID) | ORAL | Status: DC | PRN
  Filled 2012-05-09: qty 15

## 2012-05-09 MED ORDER — LOSARTAN POTASSIUM 50 MG PO TABS
50.0000 mg | ORAL_TABLET | Freq: Every day | ORAL | Status: DC
  Administered 2012-05-10: 50 mg via ORAL
  Filled 2012-05-09 (×2): qty 1

## 2012-05-09 MED ORDER — LORATADINE 10 MG PO TABS
10.0000 mg | ORAL_TABLET | Freq: Every day | ORAL | Status: DC
  Administered 2012-05-09 – 2012-05-10 (×2): 10 mg via ORAL
  Filled 2012-05-09 (×2): qty 1

## 2012-05-09 MED ORDER — CALCIUM CARBONATE 600 MG PO TABS: 600.0000 mg | ORAL_TABLET | Freq: Two times a day (BID) | ORAL | Status: DC

## 2012-05-09 MED ORDER — MAGNESIUM OXIDE 400 MG PO TABS: 400.0000 mg | ORAL_TABLET | Freq: Two times a day (BID) | ORAL | Status: DC

## 2012-05-09 MED ORDER — VITAMIN D3 25 MCG (1000 UNIT) PO TABS
3000.0000 [IU] | ORAL_TABLET | Freq: Every day | ORAL | Status: DC
  Administered 2012-05-09 – 2012-05-10 (×2): 3000 [IU] via ORAL
  Filled 2012-05-09 (×2): qty 3

## 2012-05-09 MED ORDER — ALBUTEROL SULFATE HFA 108 (90 BASE) MCG/ACT IN AERS: 2.0000 | INHALATION_SPRAY | RESPIRATORY_TRACT | Status: DC | PRN

## 2012-05-09 NOTE — Progress Notes (Signed)
  Echocardiogram 2D Echocardiogram has been performed.  Cathie Beams Deneen 05/09/2012, 3:44 PM

## 2012-05-09 NOTE — ED Notes (Signed)
Patient transported to CT 

## 2012-05-09 NOTE — H&P (Signed)
Hospital Admission Note Date: 05/09/2012  Patient name: [name] Medical record number: [id number] Date of birth: Aug 25, 1946 Age: 66 y.o. Gender: female PCP: Thora Lance, MD, MD Oncologist: Dr. Pierce Crane  Attending physician: Maryruth Bun Keidy Thurgood, MD Emergency Contact: Husband [phone], Donnie Code Status: Full  Chief Complaint: Shortness of breath and wheezing.  History of Present Illness: [name] is an 66 y.o. female with a PMH of breast cancer undergoing active treatment with chemotherapy, last treatment 05/08/12 with Taxotere and Cytoxan, who presents to the hospital with shortness of breath and wheezing that began at 1:15 a.m. this morning.  The symptoms woke her from sleep.  Has had a cough, mostly non-productive.  + 2 pillow orthopnea.  No fever or chills presently, but states that she ran a low-grade fever last weekend. No myalgias or arthralgias presently. No chest pain or palpitations. She does state that she has had a problem with pulmonary edema in the past, related to cardiomyopathy induced by prior cancer treatments, and that her symptoms are similar to when she has had problems with pulmonary edema. There have not been any aggravating or alleviating factors.  Past Medical History Past Medical History  Diagnosis Date  . Atrial fibrillation   . Other left bundle branch block   . Secondary cardiomyopathy, unspecified     EF as low as 45%  . Unspecified essential hypertension   . Unspecified hypothyroidism   . Carcinoma in situ of breast   . Raynaud's disease   . Bundle branch block     left  . Pneumonia     hx of   . Anemia   . Blood transfusion     1968  . Arthritis     fingers   . Hodgkin's disease     stage 2b  . Breast cancer 2009    T1N0M0 DCIS right breast  . Breast cancer 2013    reocurrence right breast  . Status post radiation therapy 1988    mantle and periaortic   . Mucositis 04/29/2012    Past Surgical History Past Surgical  History  Procedure Date  . Tonsillectomy and adenoidectomy 1965  . Thoractomy 1968  . Btl 1971  . R vein ligation & stripping 1976  . Spleenectomy 1988    for Hodgkin's disease  . Breast lumpectomy 2009    right, lymph node biopsy  . Mastectomy 2009    right breast  . Polypectomy 1990    D&C (also in 1977 and 1991)   . Dilation and curettage of uterus   . Tissue expander placement     right breast  . Tissue expander  removal w/ replacement of implant   . Mass excision 03/28/2012    Procedure: EXCISION MASS;  Surgeon: Mariella Saa, MD;  Location: [facility];  Service: General;  Laterality: Right;  Excision right chest wall mass  . Portacath placement 04/16/2012    Procedure: INSERTION PORT-A-CATH;  Surgeon: Mariella Saa, MD;  Location: [facility];  Service: General;  Laterality: Left;  Placement of Port-a-Cath, left subclavian    Meds: Prior to Admission medications   Medication Sig Start Date End Date Taking? Authorizing Provider  calcium carbonate (OS-CAL) 600 MG TABS Take 600 mg by mouth 2 (two) times daily with a meal.    Yes Historical Provider, MD  carvedilol (COREG) 12.5 MG tablet Take 18.75 mg by mouth 2 (two) times daily with a meal.   Yes Historical Provider, MD  Cholecalciferol (  VITAMIN D3) 3000 UNITS TABS Take 3,000 Units by mouth daily.   Yes Historical Provider, MD  dexamethasone (DECADRON) 4 MG tablet Take 8 mg by mouth 2 (two) times daily with a meal. Take 2 tablets twice a day before & after chemo days   Yes Historical Provider, MD  levofloxacin (LEVAQUIN) 500 MG tablet Take 1 tablet (500 mg total) by mouth daily. 04/30/12 05/10/12 Yes Victorino December, MD  levothyroxine (SYNTHROID, LEVOTHROID) 125 MCG tablet Take 125 mcg by mouth daily before breakfast.    Yes Historical Provider, MD  lidocaine-prilocaine (EMLA) cream Apply to port site 1 to 3 hours prior to use 04/17/12  Yes Pierce Crane, MD  losartan (COZAAR) 50 MG tablet Take 50 mg by mouth daily  before breakfast. 07/20/11  Yes Wendall Stade, MD  magnesium oxide (MAG-OX 400) 400 MG tablet Take 400 mg by mouth 2 (two) times daily.    Yes Historical Provider, MD  naproxen sodium (ANAPROX) 220 MG tablet Take 220 mg by mouth 2 (two) times daily as needed. Pain    Yes Historical Provider, MD  ondansetron (ZOFRAN) 8 MG tablet Take 8 mg by mouth every 8 (eight) hours as needed. For nausea   Yes Historical Provider, MD  OVER THE COUNTER MEDICATION Take 1 tablet by mouth 2 (two) times daily. osteodenx supplement    Yes Historical Provider, MD  OVER THE COUNTER MEDICATION Take 3 tablets by mouth daily at 12 noon. Vegetarian omega green supplement    Yes Historical Provider, MD  OVER THE COUNTER MEDICATION Take 2 capsules by mouth every morning. mental clarity supplement    Yes Historical Provider, MD  Alum & Mag Hydroxide-Simeth (MAGIC MOUTHWASH W/LIDOCAINE) SOLN Take 15 mLs by mouth 4 (four) times daily as needed. 04/30/12   Victorino December, MD  prochlorperazine (COMPAZINE) 10 MG tablet 1 tab po every 6 to 8 hours as needed for breakthrough nausea 04/17/12   Pierce Crane, MD    Allergies: Benazepril; Epinephrine; and Iohexol  Social History: History   Social History  . Marital Status: Married    Spouse Name: Donnie    Number of Children: N/A  . Years of Education: N/A   Occupational History  . [employer]    Social History Main Topics  . Smoking status: Never Smoker   . Smokeless tobacco: Never Used  . Alcohol Use: No  . Drug Use: No  . Sexually Active: Not on file     post menopausal, HRT x 8 yrs   Other Topics Concern  . Not on file   Social History Narrative   Regular exercise. Retired and married.     Family History:  Family History  Problem Relation Age of Onset  . Cancer Neg Hx   . Hypertension Mother     Review of Systems: Constitutional: No fever or chills;  Appetite normal; No weight loss.  HEENT: No blurry vision or diplopia, no pharyngitis or dysphagia, +  recent mucositis CV: No chest pain or arrhythmia.  Resp: + SOB, + cough. GI: No N/V, no diarrhea, no melena or hematochezia.  GU: No dysuria or hematuria.  MSK: no myalgias/arthralgias currently but gets leg pains with Neupogen.  Neuro:  No headache or focal neurological deficits.  Psych: No depression or anxiety.  Endo: + thyroid disease secondary to radiation treatment,  no DM.  Skin: No rashes or lesions.  Heme: + history Hodgkins disease, h/o epistaxis.   Physical Exam: Blood pressure 155/80, pulse 88, temperature 98.3  F (36.8 C), temperature source Oral, resp. rate 20, last menstrual period 03/08/1995, SpO2 100.00%. BP 155/80  Pulse 88  Temp(Src) 98.3 F (36.8 C) (Oral)  Resp 20  SpO2 100%  LMP 03/08/1995  General Appearance:    Alert, cooperative, no distress, appears stated age  Head:    Normocephalic, without obvious abnormality, atraumatic  Eyes:    PERRL, conjunctiva/corneas clear, EOM's intact  Ears:    Normal external ear canals, both ears  Nose:   Nares normal, septum midline, mucosa normal, no drainage    or sinus tenderness  Throat:   Lips, mucosa, and tongue normal; teeth and gums normal  Neck:   Supple, symmetrical, trachea midline, no adenopathy;    thyroid:  no enlargement/tenderness/nodules; no carotid   bruit or JVD  Back:     Symmetric, no curvature, ROM normal, no CVA tenderness  Lungs:     Clear to auscultation bilaterally, respirations unlabored  Chest Wall:    No tenderness or deformity. Porta-cath site left upper chest without signs of infection, non-tender.   Heart:    Regular rate and rhythm, S1 and S2 normal, no murmur, rub   or gallop  Abdomen:     Soft, non-tender, bowel sounds active all four quadrants,    no masses, no organomegaly  Extremities:   Extremities normal, atraumatic, no cyanosis or edema  Pulses:   2+ and symmetric all extremities  Skin:   Skin color, texture, turgor normal, no rashes or lesions  Lymph nodes:   Cervical, supraclavicular,  and axillary nodes normal  Neurologic:   Non-focal.   Lab results: Basic Metabolic Panel:  Lab 05/09/12 9604 05/07/12 1444  NA 132* 141  K 4.4 4.6  CL 98 102  CO2 24 31  GLUCOSE 124* 173*  BUN 20 19  CREATININE 0.60 0.86  CALCIUM 8.9 9.8  MG -- --  PHOS -- --   GFR The CrCl is unknown because both a height and weight (above a minimum accepted value) are required for this calculation. Liver Function Tests:  Lab 05/07/12 1444  AST 14  ALT 14  ALKPHOS 103  BILITOT 0.3  PROT 6.3  ALBUMIN 4.0    CBC:  Lab 05/09/12 0534 05/07/12 1444  WBC 21.9* 14.2*  NEUTROABS -- 13.5*  HGB 11.2* 11.7  HCT 33.1* 35.9  MCV 89.0 94.3  PLT 474* 392    Imaging results:   Dg Chest 2 View 05/09/2012 IMPRESSION: Mild left lower and right perihilar opacity may reflect atelectasis or pneumonia with small left pleural effusion.  Original Report Authenticated By: Waneta Martins, M.D.    Ct Angio Chest W/cm &/or Wo Cm 05/09/2012 IMPRESSION:  No pulmonary embolism identified.  Lower lobe peripheral branches are degraded by respiratory motion.  Bilateral pleural effusions. Peribronchial cuffing and areas of scattered ground-glass opacity, right lower lobe predominant, may reflect infection or edema.  Post radiation changes of the paramediastinal lungs.  Osseous scattered lucencies without cortical breakthrough, nonspecific.  May be secondary to advanced osteopenia however cannot entirely exclude other processes such as metastatic disease or myeloma.  No osseous hypermetabolic activity was appreciated on recent PET CT.  Original Report Authenticated By: Waneta Martins, M.D.   12-lead EKG SINUS RHYTHM ~ normal P axis, V-rate 50- 99 PROBABLE LEFT ATRIAL ABNORMALITY ~ P >65mS, <-0.70mV V1 LEFT BUNDLE BRANCH BLOCK ~ QRSd>120, broad/notched R  Assessment & Plan: Principal Problem:  *Dyspnea secondary to CAP versus acute on chronic CHF in a patient with  a history of cardiomyopathy and who is  undergoing chemotherapy for breast cancer:  Source of dyspnea may be from community acquired pneumonia given findings on chest x-ray, CT scan of the chest, and elevated white blood cell count. (Although decadron may be driving the WBC elevation)  Alternatively, the patient may be short of breath from pulmonary edema in the setting of known cardiomyopathy. Her pro BNP levels were elevated at 1660. Will repeat two-dimensional echocardiogram (last one done 5/09 which showed an EF of 55%).  At this point, until the clinical situation declares itself, would broaden antibiotics from Levaquin to vancomycin, Rocephin and azithromycin and monitor her white blood cell count.  She has already been given a dose of Lasix in the emergency department.  Would recheck her chest x-ray in the morning, and if it shows significant improvement, the source of her dyspnea was more likely to be from pulmonary edema as opposed to infection and would narrow antibiotics.  Would initiate therapy with Neupogen per her treatment protocol, for 5 days. This was discussed with her oncologist, Dr. Donnie Coffin. Active Problems:  Breast Cancer  Dr. Donnie Coffin notified of patient's admission and treatment plan discussed with him.  HYPOTHYROIDISM  Continue Synthroid.  HYPERTENSION, UNSPECIFIED  Continue usual antihypertensives.  Normocytic anemia  Mild. Likely bone marrow suppression from prior chemotherapy.  Prophylaxis: PAS hoses preferred due to history of epistaxis.  Time Spent On Admission: 1 hour.  Osa Campoli 05/09/2012, 8:24 AM Pager [phone]

## 2012-05-09 NOTE — ED Notes (Signed)
[id number]<JY> Expected date:<BR> Expected time:<BR> Means of arrival:<BR> Comments:<BR> [facility] pt

## 2012-05-09 NOTE — Progress Notes (Signed)
ANTIBIOTIC CONSULT NOTE - INITIAL  Pharmacy Consult for Vancomycin Indication: pneumonia  Allergies  Allergen Reactions  . Benazepril     cough  . Epinephrine     REACTION: BUZZ  . Iohexol      Desc: SNEEZING, pt states this happened in 1988. Has had iv contrast since without being premedicated and has had no problems. Chest ct 2005- LC 05/09/12     Patient Measurements: Height: 5\' 2"  (157.5 cm) Weight: 138 lb 6.4 oz (62.778 kg) IBW/kg (Calculated) : 50.1   Vital Signs: Temp: 98.3 F (36.8 C) (05/22 0944) Temp src: Oral (05/22 0944) BP: 117/68 mmHg (05/22 0944) Pulse Rate: 103  (05/22 0944) Intake/Output from previous day:   Intake/Output from this shift:    Labs:  Basename 05/09/12 0534 05/07/12 1444  WBC 21.9* 14.2*  HGB 11.2* 11.7  PLT 474* 392  LABCREA -- --  CREATININE 0.60 0.86   Estimated Creatinine Clearance: 60.3 ml/min (by C-G formula based on Cr of 0.6).  78 ml/min (normalized)  Microbiology: Recent Results (from the past 720 hour(s))  SURGICAL PCR SCREEN     Status: Normal   Collection Time   04/12/12  9:35 AM      Component Value Range Status Comment   MRSA, PCR NEGATIVE  NEGATIVE  Final    Staphylococcus aureus NEGATIVE  NEGATIVE  Final   TECHNOLOGIST REVIEW     Status: Normal   Collection Time   04/27/12  2:19 PM      Component Value Range Status Comment   Technologist Review Large & giant platelets   Final   URINE CULTURE     Status: Normal   Collection Time   04/27/12  8:16 PM      Component Value Range Status Comment   Specimen Description URINE, RANDOM   Final    Special Requests known Immunocompromised   Final    Culture  Setup Time [id number]   Final    Colony Count 15,000 COLONIES/ML   Final    Culture     Final    Value: Multiple bacterial morphotypes present, none predominant. Suggest appropriate recollection if clinically indicated.   Report Status 04/29/2012 FINAL   Final     Medical History: Past Medical History    Diagnosis Date  . Atrial fibrillation   . Other left bundle branch block   . Secondary cardiomyopathy, unspecified     EF as low as 45%  . Unspecified essential hypertension   . Unspecified hypothyroidism   . Carcinoma in situ of breast   . Raynaud's disease   . Bundle branch block     left  . Pneumonia     hx of   . Anemia   . Blood transfusion     1968  . Arthritis     fingers   . Hodgkin's disease     stage 2b  . Breast cancer 2009    T1N0M0 DCIS right breast  . Breast cancer 2013    reocurrence right breast  . Status post radiation therapy 1988    mantle and periaortic   . Mucositis 04/29/2012    Medications:  Scheduled:    . albuterol  2.5 mg Nebulization Once  . azithromycin  500 mg Oral Daily   Followed by  . azithromycin  250 mg Oral Daily  . calcium carbonate  1 tablet Oral BID WC  . carvedilol  18.75 mg Oral BID WC  . cefTRIAXone (ROCEPHIN)  IV  1  g Intravenous Q24H  . cholecalciferol  3,000 Units Oral Daily  . dexamethasone  8 mg Oral BID WC  . filgrastim  300 mcg Subcutaneous Daily  . furosemide  40 mg Intravenous Once  . levothyroxine  125 mcg Oral QAC breakfast  . loratadine  10 mg Oral Daily  . losartan  50 mg Oral Daily  . magnesium oxide  400 mg Oral BID  . piperacillin-tazobactam (ZOSYN)  IV  3.375 g Intravenous Once  . sodium chloride  3 mL Intravenous Q12H  . vancomycin  1,000 mg Intravenous Once  . DISCONTD: calcium carbonate  600 mg Oral BID WC  . DISCONTD: filgrastim  300 mcg Subcutaneous Daily  . DISCONTD: magnesium oxide  400 mg Oral BID  . DISCONTD: Vitamin D3  3,000 Units Oral Daily    Assessment:  9 YOF with breast cancer s/p chemo with taxotere/cytoxan on 5/21  Presents with SOB and wheezing, concerning for PNA.  Given one-time doses of zosyn & vancomycin this am, now ordered to continue vancomycin, start rocephin/zithromax  Goal of Therapy:  Vancomycin trough level 15-20 mcg/ml  Plan:   Received vancomycin 1gm at  8am, will continue with 750mg  IV q12h starting tonight at 10pm  Check trough at steady state  Follow renal function & cultures  Rocephin/Zithromax per MD  Loralee Pacas, PharmD, BCPS Pager: [phone] 05/09/2012,10:10 AM

## 2012-05-09 NOTE — ED Provider Notes (Signed)
History     CSN: [id number]  Arrival date & time 05/09/12  4098   First MD Initiated Contact with Patient 05/09/12 [phone]      Chief Complaint  Patient presents with  . Shortness of Breath    (Consider location/radiation/quality/duration/timing/severity/associated sxs/prior treatment) HPI 66 year old female presents emergency department with shortness of breath. Patient reports she woke this morning around 1:15 and noticed that she was breathing hard and fast. Patient set up for about an hour, and then when she could not get comfortable, she went and checked her blood pressure. Her blood pressure was elevated, so she took a dose of her bp medication.  No chest pain, no fever, no cough.  Pt continued to have increased respiratory rate, and was concerned for pulmonary edema (has h/o cardiomyopathy/chf after radiation for hodgkins).  Pt has history of afib/flutter, has had some palpations over the last few weeks intermittently.  Pt currently undergoing tx for breast cancer.  Pt admitted 2 weeks ago for neutropenic fever, reports no scds/ted hose during that stay.  Pt called her oncologist who told her to go to the ER if she was having persistent sxs, to not wait for neulasta shot at 9 am.  Pt had 2 of 4 chemo tx yesterday.   Past Medical History  Diagnosis Date  . Atrial fibrillation   . Other left bundle branch block   . Secondary cardiomyopathy, unspecified   . Unspecified essential hypertension   . Unspecified hypothyroidism   . Carcinoma in situ of breast   . Raynaud's disease   . Bundle branch block     left  . Pneumonia     hx of   . Anemia   . Blood transfusion     1968  . Arthritis     fingers   . Hodgkin's disease     stage 2b  . Breast cancer 2009    T1N0M0 DCIS right breast  . Breast cancer 2013    reocurrence right breast  . Status post radiation therapy 1988    mantle and periaortic   . Mucositis 04/29/2012    Past Surgical History  Procedure Date  .  Tonsillectomy and adenoidectomy 1965  . Thoractomy 1968  . Btl 1971  . R vein ligation & stripping 1976  . Spleenectomy 1988    for Hodgkin's disease  . Breast lumpectomy 2009    right, lymph node biopsy  . Mastectomy 2009    right breast  . Polypectomy 1990    D&C (also in 1977 and 1991)   . Dilation and curettage of uterus   . Tissue expander placement     right breast  . Tissue expander  removal w/ replacement of implant   . Mass excision 03/28/2012    Procedure: EXCISION MASS;  Surgeon: Mariella Saa, MD;  Location: [facility];  Service: General;  Laterality: Right;  Excision right chest wall mass  . Portacath placement 04/16/2012    Procedure: INSERTION PORT-A-CATH;  Surgeon: Mariella Saa, MD;  Location: [facility];  Service: General;  Laterality: Left;  Placement of Port-a-Cath, left subclavian    Family History  Problem Relation Age of Onset  . Cancer Neg Hx   . Hypertension Mother     History  Substance Use Topics  . Smoking status: Never Smoker   . Smokeless tobacco: Never Used  . Alcohol Use: No    OB History    Grav Para Term Preterm Abortions TAB SAB Ect  Mult Living                  Review of Systems  All other systems reviewed and are negative.    Allergies  Benazepril; Epinephrine; and Iohexol  Home Medications   Current Outpatient Rx  Name Route Sig Dispense Refill  . CALCIUM CARBONATE 600 MG PO TABS Oral Take 600 mg by mouth 2 (two) times daily with a meal.     . CARVEDILOL 12.5 MG PO TABS Oral Take 18.75 mg by mouth 2 (two) times daily with a meal.    . VITAMIN D3 3000 UNITS PO TABS Oral Take 3,000 Units by mouth daily.    Marland Kitchen DEXAMETHASONE 4 MG PO TABS Oral Take 8 mg by mouth 2 (two) times daily with a meal. Take 2 tablets twice a day before & after chemo days    . LEVOFLOXACIN 500 MG PO TABS Oral Take 1 tablet (500 mg total) by mouth daily. 10 tablet 0  . LEVOTHYROXINE SODIUM 125 MCG PO TABS Oral Take 125 mcg by mouth  daily before breakfast.     . LIDOCAINE-PRILOCAINE 2.5-2.5 % EX CREA  Apply to port site 1 to 3 hours prior to use 30 g 1  . LOSARTAN POTASSIUM 50 MG PO TABS Oral Take 50 mg by mouth daily before breakfast.    . MAGNESIUM OXIDE 400 MG PO TABS Oral Take 400 mg by mouth 2 (two) times daily.     Marland Kitchen NAPROXEN SODIUM 220 MG PO TABS Oral Take 220 mg by mouth 2 (two) times daily as needed. Pain     . ONDANSETRON HCL 8 MG PO TABS Oral Take 8 mg by mouth every 8 (eight) hours as needed. For nausea    . OVER THE COUNTER MEDICATION Oral Take 1 tablet by mouth 2 (two) times daily. osteodenx supplement     . OVER THE COUNTER MEDICATION Oral Take 3 tablets by mouth daily at 12 noon. Vegetarian omega green supplement     . OVER THE COUNTER MEDICATION Oral Take 2 capsules by mouth every morning. mental clarity supplement     . MAGIC MOUTHWASH W/LIDOCAINE Oral Take 15 mLs by mouth 4 (four) times daily as needed. 60 mL 3  . PROCHLORPERAZINE MALEATE 10 MG PO TABS  1 tab po every 6 to 8 hours as needed for breakthrough nausea 30 tablet 0    BP 155/80  Pulse 87  Temp(Src) 98.3 F (36.8 C) (Oral)  Resp 20  SpO2 94%  LMP 03/08/1995  Physical Exam  Nursing note and vitals reviewed. Constitutional: She is oriented to person, place, and time. She appears well-developed and well-nourished.  HENT:  Head: Normocephalic and atraumatic.  Right Ear: External ear normal.  Left Ear: External ear normal.  Nose: Nose normal.  Mouth/Throat: Oropharynx is clear and moist.  Eyes: Conjunctivae and EOM are normal. Pupils are equal, round, and reactive to light.  Neck: Normal range of motion. Neck supple. No JVD present. No tracheal deviation present. No thyromegaly present.  Cardiovascular: Normal rate, regular rhythm, normal heart sounds and intact distal pulses.  Exam reveals no gallop and no friction rub.   No murmur heard. Pulmonary/Chest: Effort normal. No stridor. No respiratory distress. She has wheezes. She has  no rales. She exhibits no tenderness.       Rhonchi in right lung base  Abdominal: Soft. Bowel sounds are normal. She exhibits no distension and no mass. There is no tenderness. There is no rebound and  no guarding.  Musculoskeletal: Normal range of motion. She exhibits no edema and no tenderness.  Lymphadenopathy:    She has no cervical adenopathy.  Neurological: She is oriented to person, place, and time. She has normal reflexes. She exhibits normal muscle tone. Coordination normal.  Skin: Skin is dry. No rash noted. No erythema. No pallor.  Psychiatric: She has a normal mood and affect. Her behavior is normal. Judgment and thought content normal.    ED Course  Procedures (including critical care time)   Labs Reviewed  CBC  PRO B NATRIURETIC PEPTIDE  BASIC METABOLIC PANEL  PROTIME-INR   Dg Chest 2 View  05/09/2012  *RADIOLOGY REPORT*  Clinical Data: Shortness of breath, cough.  CHEST - 2 VIEW  Comparison: 04/27/2012  Findings: Left chest wall Port-A-Cath with tip projecting over the brachiocephalic/SVC confluence.  Small left pleural effusion with associated opacity blunts the posterior costophrenic sulcus.  Mild hazy opacities in the right lower lobe may be exaggerated by overlying soft tissue/breast prosthesis. Even allowing for this, unable to exclude a perihilar/right middle lobe process.  No pneumothorax.  Left apical capping and left paramediastinal opacity/scarring.  Atherosclerotic aortic arch.  Heart size upper normal to mildly enlarged.  Osteopenia.  No definite acute osseous finding.  IMPRESSION: Mild left lower and right perihilar opacity may reflect atelectasis or pneumonia with small left pleural effusion.  Original Report Authenticated By: Waneta Martins, M.D.   Ct Angio Chest W/cm &/or Wo Cm  05/09/2012  *RADIOLOGY REPORT*  Clinical Data: Cough, shortness of breath.  CT ANGIOGRAPHY CHEST  Technique:  Multidetector CT imaging of the chest using the standard protocol  during bolus administration of intravenous contrast. Multiplanar reconstructed images including MIPs were obtained and reviewed to evaluate the vascular anatomy.  Contrast: OMNIPAQUE IOHEXOL 300 MG/ML  SOLN  Comparison: 04/17/2012 PET-CT, 12/08/2003 chest CT, 03/31/2004 chest CT.  Findings: Linear paramediastinal opacities, most in keeping with scarring / radiation changes.  There is interval increased hazy opacity of the lungs and more confluent patchy ground-glass opacities within the right lower lobe.  Small bilateral pleural effusions.  Respiratory motion degrades detailed parenchymal evaluation. Right greater than left peribronchial thickening.  Mild debris within the right lower lobe bronchioles. No pneumothorax.  Respiratory motion degrades evaluation of the lower lobe segmental branches.  Otherwise, no pulmonary arterial branch filling defect is identified. Scattered atherosclerotic calcification of the aorta and branch vessels.  No dissection.  Heart size upper normal limits to mildly enlarged.  Coronary artery calcification.  Right breast prosthesis.  No intrathoracic lymphadenopathy.  Left chest wall Port-A-Cath, with catheter tip terminating at the cavoatrial junction.  Advanced osteopenia.  Numerous lucent foci scattered throughout vertebral bodies.  Irregular, resorptive pattern of the left posterior sixth rib, with evidence of prior fracture.  Limited images through the upper abdomen demonstrate no acute abnormality.  IMPRESSION:  No pulmonary embolism identified.  Lower lobe peripheral branches are degraded by respiratory motion.  Bilateral pleural effusions. Peribronchial cuffing and areas of scattered ground-glass opacity, right lower lobe predominant, may reflect infection or edema.  Post radiation changes of the paramediastinal lungs.  Osseous scattered lucencies without cortical breakthrough, nonspecific.  May be secondary to advanced osteopenia however cannot entirely exclude other  processes such as metastatic disease or myeloma.  No osseous hypermetabolic activity was appreciated on recent PET CT.  Original Report Authenticated By: Waneta Martins, M.D.    Date: 05/09/2012  Rate:98  Rhythm: normal sinus rhythm  QRS Axis: left  Intervals: normal  ST/T Wave abnormalities: normal  Conduction Disutrbances:left bundle branch block  Narrative Interpretation:   Old EKG Reviewed: unchanged    1. Dyspnea   2. Pulmonary edema   3. Hospital acquired PNA       MDM  66 yo female with tachypnea, sob, mild decrease in sp02 with multiple risk factors for PE.  Will get labs, CTA chest, ekg.        Olivia Mackie, MD 05/09/12 [phone]

## 2012-05-09 NOTE — ED Notes (Signed)
Pt from home.  Pt with breast cancer.  Pt woke up this am with shortness of breath.  No pain.  150/80, hr 80, sats 97% on 2l per [facility].  Pt had second chemo treatment yesterday.

## 2012-05-09 NOTE — Care Management Note (Signed)
    Page 1 of 1   05/10/2012     11:29:22 AM   CARE MANAGEMENT NOTE 05/10/2012  Patient:  [name], [name]   Account Number:  [id number]  Date Initiated:  05/09/2012  Documentation initiated by:  Lanier Clam  Subjective/Objective Assessment:   ADMITTED W/SOB,WHEEZE.NW:[id number],ONGEXB CA.     Action/Plan:   FROM HOME W/SPOUSE   Anticipated DC Date:  05/10/2012   Anticipated DC Plan:  HOME/SELF CARE      DC Planning Services  CM consult      Choice offered to / List presented to:             Status of service:  Completed, signed off Medicare Important Message given?   (If response is "NO", the following Medicare IM given date fields will be blank) Date Medicare IM given:   Date Additional Medicare IM given:    Discharge Disposition:  HOME/SELF CARE  Per UR Regulation:  Reviewed for med. necessity/level of care/duration of stay  If discussed at Long Length of Stay Meetings, dates discussed:    Comments:  05/09/12 [facility] RN,BSN NCM [phone]

## 2012-05-10 ENCOUNTER — Other Ambulatory Visit: Payer: Self-pay | Admitting: *Deleted

## 2012-05-10 ENCOUNTER — Inpatient Hospital Stay (HOSPITAL_COMMUNITY): Payer: Medicare Other

## 2012-05-10 ENCOUNTER — Telehealth: Payer: Self-pay | Admitting: Oncology

## 2012-05-10 DIAGNOSIS — D696 Thrombocytopenia, unspecified: Secondary | ICD-10-CM

## 2012-05-10 DIAGNOSIS — I509 Heart failure, unspecified: Secondary | ICD-10-CM

## 2012-05-10 DIAGNOSIS — C50919 Malignant neoplasm of unspecified site of unspecified female breast: Secondary | ICD-10-CM

## 2012-05-10 DIAGNOSIS — R0609 Other forms of dyspnea: Secondary | ICD-10-CM

## 2012-05-10 DIAGNOSIS — R112 Nausea with vomiting, unspecified: Secondary | ICD-10-CM

## 2012-05-10 LAB — BASIC METABOLIC PANEL
BUN: 25 mg/dL — ABNORMAL HIGH (ref 6–23)
CO2: 28 mEq/L (ref 19–32)
Glucose, Bld: 116 mg/dL — ABNORMAL HIGH (ref 70–99)
Potassium: 4.1 mEq/L (ref 3.5–5.1)
Sodium: 136 mEq/L (ref 135–145)

## 2012-05-10 LAB — CBC
HCT: 32.1 % — ABNORMAL LOW (ref 36.0–46.0)
Hemoglobin: 10.9 g/dL — ABNORMAL LOW (ref 12.0–15.0)
RBC: 3.57 MIL/uL — ABNORMAL LOW (ref 3.87–5.11)

## 2012-05-10 MED ORDER — FUROSEMIDE 40 MG PO TABS: 40.0000 mg | ORAL_TABLET | Freq: Every day | ORAL | Status: DC | PRN

## 2012-05-10 MED ORDER — ENOXAPARIN SODIUM 40 MG/0.4ML ~~LOC~~ SOLN
40.0000 mg | SUBCUTANEOUS | Status: DC
  Administered 2012-05-10: 40 mg via SUBCUTANEOUS
  Filled 2012-05-10 (×2): qty 0.4

## 2012-05-10 MED ORDER — HEPARIN SOD (PORK) LOCK FLUSH 100 UNIT/ML IV SOLN: 500.0000 [IU] | INTRAVENOUS | Status: DC | PRN

## 2012-05-10 MED ORDER — SODIUM CHLORIDE 0.9 % IJ SOLN
10.0000 mL | INTRAMUSCULAR | Status: DC | PRN
  Administered 2012-05-10: 10 mL

## 2012-05-10 NOTE — Progress Notes (Signed)
05/10/12 0650 Lab notified RN that WBC count was 78.3. MD to be notified.

## 2012-05-10 NOTE — Discharge Summary (Signed)
Physician Discharge Summary  Patient ID: [name] MRN: [id number] DOB/AGE: [birth year] 67 y.o.  Admit date: 05/09/2012 Discharge date: 05/10/2012  Primary Care Physician:  Thora Lance, MD, MD Oncologist: Dr. Pierce Crane   Discharge Diagnoses:    .Dyspnea .Possible Community acquired pneumonia .Acute on chronic diastolic congestive heart failure .HYPOTHYROIDISM .HYPERTENSION, UNSPECIFIED .CARDIOMYOPATHY, SECONDARY .Normocytic anemia . Cancer of Breast T1bN0M0 triple neg S/P mastectomy/reconstruction 04/2008  Discharge Medications:  Medication List  As of 05/10/2012 11:30 AM   TAKE these medications         calcium carbonate 600 MG Tabs   Commonly known as: OS-CAL   Take 600 mg by mouth 2 (two) times daily with a meal.      carvedilol 12.5 MG tablet   Commonly known as: COREG   Take 18.75 mg by mouth 2 (two) times daily with a meal.      dexamethasone 4 MG tablet   Commonly known as: DECADRON   Take 8 mg by mouth 2 (two) times daily with a meal. Take 2 tablets twice a day before & after chemo days      furosemide 40 MG tablet   Commonly known as: LASIX   Take 1 tablet (40 mg total) by mouth daily as needed (Take as needed for shortness of breath or greater than 3 pound weight gain/24 hours.).      levofloxacin 500 MG tablet   Commonly known as: LEVAQUIN   Take 1 tablet (500 mg total) by mouth daily.      levothyroxine 125 MCG tablet   Commonly known as: SYNTHROID, LEVOTHROID   Take 125 mcg by mouth daily before breakfast.      lidocaine-prilocaine cream   Commonly known as: EMLA   Apply to port site 1 to 3 hours prior to use      losartan 50 MG tablet   Commonly known as: COZAAR   Take 50 mg by mouth daily before breakfast.      MAG-OX 400 400 MG tablet   Generic drug: magnesium oxide   Take 400 mg by mouth 2 (two) times daily.      magic mouthwash w/lidocaine Soln   Take 15 mLs by mouth 4 (four) times daily as needed.      naproxen sodium 220  MG tablet   Commonly known as: ANAPROX   Take 220 mg by mouth 2 (two) times daily as needed. Pain        ondansetron 8 MG tablet   Commonly known as: ZOFRAN   Take 8 mg by mouth every 8 (eight) hours as needed. For nausea      OVER THE COUNTER MEDICATION   Take 1 tablet by mouth 2 (two) times daily. osteodenx supplement        OVER THE COUNTER MEDICATION   Take 3 tablets by mouth daily at 12 noon. Vegetarian omega green supplement        OVER THE COUNTER MEDICATION   Take 2 capsules by mouth every morning. mental clarity supplement        prochlorperazine 10 MG tablet   Commonly known as: COMPAZINE   1 tab po every 6 to 8 hours as needed for breakthrough nausea      Vitamin D3 3000 UNITS Tabs   Take 3,000 Units by mouth daily.             Disposition and Follow-up: The patient is being discharged home.  She will follow up with Dr. Donnie Coffin for a  Neupogen injection 05/11/12.  She is instructed to follow up with her PCP as needed.   Medical Consults:  Dr. Pierce Crane, Oncology   Procedures and Diagnostic Studies:   Dg Chest 2 View 05/09/2012  IMPRESSION: Mild left lower and right perihilar opacity may reflect atelectasis or pneumonia with small left pleural effusion.  Original Report Authenticated By: Waneta Martins, M.D.    Ct Angio Chest W/cm &/or Wo Cm 05/09/2012 IMPRESSION:  No pulmonary embolism identified.  Lower lobe peripheral branches are degraded by respiratory motion.  Bilateral pleural effusions. Peribronchial cuffing and areas of scattered ground-glass opacity, right lower lobe predominant, may reflect infection or edema.  Post radiation changes of the paramediastinal lungs.  Osseous scattered lucencies without cortical breakthrough, nonspecific.  May be secondary to advanced osteopenia however cannot entirely exclude other processes such as metastatic disease or myeloma.  No osseous hypermetabolic activity was appreciated on recent PET CT.  Original Report  Authenticated By: Waneta Martins, M.D.    Dg Chest Port 1 View 05/10/2012 IMPRESSION: Slight interval improvement in small bilateral effusions and left basilar atelectasis.  Persistent, faint alveolar opacity in the right base.  Original Report Authenticated By: Brandon Melnick, M.D.    2 D Echocardiogram 05/09/12: Impressions: Normal LV size and systolic function, EF 60-65%. Normal RV size and systolic function. No significant valvular abnormalities. Grade 1 diastolic dysfunction.     Discharge Laboratory Values: Basic Metabolic Panel:  Lab 05/10/12 1610 05/09/12 0534 05/07/12 1444  NA 136 132* 141  K 4.1 4.4 --  CL 101 98 102  CO2 28 24 31   GLUCOSE 116* 124* 173*  BUN 25* 20 19  CREATININE 0.63 0.60 0.86  CALCIUM 8.1* 8.9 9.8  MG -- -- --  PHOS -- -- --   GFR Estimated Creatinine Clearance: 60.2 ml/min (by C-G formula based on Cr of 0.63). Liver Function Tests:  Lab 05/07/12 1444  AST 14  ALT 14  ALKPHOS 103  BILITOT 0.3  PROT 6.3  ALBUMIN 4.0   CBC:  Lab 05/10/12 0540 05/09/12 0534 05/07/12 1444  WBC 78.3* 21.9* 14.2*  NEUTROABS -- -- 13.5*  HGB 10.9* 11.2* 11.7  HCT 32.1* 33.1* 35.9  MCV 89.9 89.0 94.3  PLT 441* 474* 392   Brief H and P: For complete details please refer to admission H and P, but in brief, [name] is an 66 y.o. female with a PMH of breast cancer undergoing active treatment with chemotherapy, last treatment 05/08/12 with Taxotere and Cytoxan, who presented to the hospital 05/09/12 with shortness of breath and wheezing that began at 1:15 a.m. In the morning, awakening her from sleep. Her dyspnea is felt to be a combination of acute on chronic diastolic CHF and possible CAP contributory.  Physical Exam at Discharge: BP 124/72  Pulse 82  Temp(Src) 97.8 F (36.6 C) (Oral)  Resp 18  Ht 5\' 2"  (1.575 m)  Wt 62.732 kg (138 lb 4.8 oz)  BMI 25.30 kg/m2  SpO2 96%  LMP 03/08/1995 Gen:  NAD Cardiovascular:  RRR, No M/R/G Respiratory:  Lungs CTAB Gastrointestinal: Abdomen soft, NT/ND with normal active bowel sounds. Extremities: No C/E/C  Hospital Course:  Principal Problem:  *Dyspnea secondary to CAP versus acute on chronic diastolic CHF in a patient with a history of cardiomyopathy and who is undergoing chemotherapy for breast cancer:  Source of dyspnea initially thought to be from possible community acquired pneumonia given initial findings on chest x-ray, CT scan of the chest,  and elevated white blood cell count. (Although decadron may be driving the WBC elevation)  Alternatively, the patient's shortness of breath was felt to be possibly due to pulmonary edema in the setting of known cardiomyopathy. Her pro BNP levels were elevated at 1660.  The patient was initially placed on vancomycin, Rocephin and azithromycin (had been on Levaquin as an outpatient) with plans to repeat CXR in the a.m, and if CXR showed signs of clearing, to narrow antibiotics and treat for CHF. CXR showed improvement, so antibiotics were narrowed to Rocephin and azithromycin.  She was given a dose of Lasix in the emergency department with improvement in dyspnea.  2 D Echo done 05/09/12 which showed an EF of 60-65% and grade 1 diastolic dysfunction.  At this time, her dyspnea is felt to most likely have been due to acute on chronic diastolic CHF with possibly resolving CAP contributory. She will be discharged on PRN lasix with instructions to weigh herself daily and to give herself a dose for a > 3 lb weight gain or symptomatic dyspnea.  She can finish up her previously prescribed course of Levaquin for possible CAP. Neupogen therapy per her treatment protocol initiated, for 5 days, as discussed with her oncologist, Dr. Donnie Coffin.  She has received 2 days here, and will follow up at Dr. Renelda Loma office tomorrow for additional doses. Active Problems:  Breast Cancer  Dr. Donnie Coffin notified of patient's admission and treatment plan discussed with him.  He saw the  patient on 05/10/12. Note/plan reviewed. HYPOTHYROIDISM  Continue Synthroid. HYPERTENSION, UNSPECIFIED  Continue usual antihypertensives. Normocytic anemia  Mild. Likely bone marrow suppression from prior chemotherapy.   Diet:  Regular, low sodium  Activity:  Increase activity slowly  Condition at Discharge:   Improved  Time spent on Discharge:  35 minutes  Signed: Dr. Trula Ore Lexis Potenza Pager [phone] 05/10/2012, 11:30 AM

## 2012-05-10 NOTE — Progress Notes (Signed)
PROGRESS NOTE  [name] HYQ:[id number] DOB: November 27, 1946 DOA: 05/09/2012 PCP: Thora Lance, MD, MD  Brief narrative: [name] is an 66 y.o. female with a PMH of breast cancer undergoing active treatment with chemotherapy, last treatment 05/08/12 with Taxotere and Cytoxan, who presented to the hospital 05/09/12 with shortness of breath and wheezing that began at 1:15 a.m. In the morning, awakening her from sleep. Her dyspnea is felt to be a combination of acute on chronic diastolic CHF and possible CAP contributory.   Assessment/Plan: Principal Problem:  *Dyspnea secondary to CAP versus acute on chronic diastolic CHF in a patient with a history of cardiomyopathy and who is undergoing chemotherapy for breast cancer:  Source of dyspnea may be from community acquired pneumonia given findings on chest x-ray, CT scan of the chest, and elevated white blood cell count. (Although decadron may be driving the WBC elevation)  Alternatively, the patient may be short of breath from pulmonary edema in the setting of known cardiomyopathy. Her pro BNP levels were elevated at 1660.  The patient was initially placed on vancomycin, Rocephin and azithromycin with plans to repeat CXR in the a.m, and if CXR showed signs of clearing, to narrow antibiotics and treat for CHF. CXR showed improvement, so antibiotics were narrowed to Rocephin and azithromycin. She was given a dose of Lasix in the emergency department with improvement in dyspnea.  2 D Echo done 05/09/12 which showed an EF of 60-65% and grade 1 diastolic dysfunction. Neupogen therapy per her treatment protocol initiated, for 5 days, as discussed with her oncologist, Dr. Donnie Coffin. Active Problems:  Breast Cancer  Dr. Donnie Coffin notified of patient's admission and treatment plan discussed with him. He saw the patient on 05/10/12.  Note/plan reviewed. HYPOTHYROIDISM  Continue Synthroid. HYPERTENSION, UNSPECIFIED  Continue usual  antihypertensives. Normocytic anemia  Mild. Likely bone marrow suppression from prior chemotherapy.  Code Status: Full Family Communication: Updated at bedside. Disposition Plan: Home, when stable.  Medical Consultants:  Dr. Pierce Crane, Oncology  Other consultants:  None  Antibiotics:  Azithromycin 05/09/12--->  Vancomycin 05/09/12--->05/10/12  Rocephin 05/09/12--->  Zosyn 05/09/12--->05/09/12   Subjective  No complaints of dyspnea or cough today.  Feels well.     Objective    Interim History: Stable overnight.   Objective: Filed Vitals:   05/09/12 1407 05/09/12 2238 05/10/12 0500 05/10/12 0554  BP: 106/65 117/69 124/72   Pulse: 92 93 82   Temp: 97.6 F (36.4 C) 97.9 F (36.6 C) 97.8 F (36.6 C)   TempSrc: Oral Oral Oral   Resp: 18 18 18    Height:      Weight:    62.732 kg (138 lb 4.8 oz)  SpO2: 96% 97% 96%     Intake/Output Summary (Last 24 hours) at 05/10/12 1034 Last data filed at 05/09/12 2100  Gross per 24 hour  Intake   1410 ml  Output      0 ml  Net   1410 ml    Exam: Gen:  NAD Cardiovascular:  RRR, No M/R/G Respiratory: Lungs CTAB Gastrointestinal: Abdomen soft, NT/ND with normal active bowel sounds. Extremities: No C/E/C  Data Reviewed: Basic Metabolic Panel:  Lab 05/10/12 9528 05/09/12 0534 05/07/12 1444  NA 136 132* 141  K 4.1 4.4 --  CL 101 98 102  CO2 28 24 31   GLUCOSE 116* 124* 173*  BUN 25* 20 19  CREATININE 0.63 0.60 0.86  CALCIUM 8.1* 8.9 9.8  MG -- -- --  PHOS -- -- --  GFR Estimated Creatinine Clearance: 60.2 ml/min (by C-G formula based on Cr of 0.63). Liver Function Tests:  Lab 05/07/12 1444  AST 14  ALT 14  ALKPHOS 103  BILITOT 0.3  PROT 6.3  ALBUMIN 4.0   CBC:  Lab 05/10/12 0540 05/09/12 0534 05/07/12 1444  WBC 78.3* 21.9* 14.2*  NEUTROABS -- -- 13.5*  HGB 10.9* 11.2* 11.7  HCT 32.1* 33.1* 35.9  MCV 89.9 89.0 94.3  PLT 441* 474* 392   BNP:  Ref. Range 05/09/2012 05:34  Pro B Natriuretic peptide  (BNP) Latest Range: 0-125 pg/mL 1660.0 (H)   Microbiology No results found for this or any previous visit (from the past 240 hour(s)).  Procedures and Diagnostic Studies:  Dg Chest 2 View 05/09/2012 IMPRESSION: Mild left lower and right perihilar opacity may reflect atelectasis or pneumonia with small left pleural effusion.  Original Report Authenticated By: Waneta Martins, M.D.    Ct Angio Chest W/cm &/or Wo Cm 05/09/2012  IMPRESSION:  No pulmonary embolism identified.  Lower lobe peripheral branches are degraded by respiratory motion.  Bilateral pleural effusions. Peribronchial cuffing and areas of scattered ground-glass opacity, right lower lobe predominant, may reflect infection or edema.  Post radiation changes of the paramediastinal lungs.  Osseous scattered lucencies without cortical breakthrough, nonspecific.  May be secondary to advanced osteopenia however cannot entirely exclude other processes such as metastatic disease or myeloma.  No osseous hypermetabolic activity was appreciated on recent PET CT.  Original Report Authenticated By: Waneta Martins, M.D.    2 D Echocardiogram 05/09/12: Impressions: Normal LV size and systolic function, EF 60-65%. Normal RV size and systolic function. No significant valvular abnormalities. Grade 1 diastolic dysfunction.    Scheduled Meds:   . azithromycin  500 mg Oral Daily   Followed by  . azithromycin  250 mg Oral Daily  . calcium carbonate  1 tablet Oral BID WC  . carvedilol  18.75 mg Oral BID WC  . cefTRIAXone (ROCEPHIN)  IV  1 g Intravenous Q24H  . cholecalciferol  3,000 Units Oral Daily  . dexamethasone  8 mg Oral BID WC  . enoxaparin  40 mg Subcutaneous Q24H  . filgrastim  300 mcg Subcutaneous Daily  . levothyroxine  125 mcg Oral QAC breakfast  . loratadine  10 mg Oral Daily  . losartan  50 mg Oral Daily  . magnesium oxide  400 mg Oral BID  . sodium chloride  3 mL Intravenous Q12H  . DISCONTD: vancomycin  750 mg Intravenous  Q12H   Continuous Infusions:     LOS: 1 day   Hillery Aldo, MD Pager [phone]  05/10/2012, 10:34 AM

## 2012-05-10 NOTE — Discharge Instructions (Signed)
Heart Failure Heart failure (HF) is a condition in which the heart has trouble pumping blood. This means your heart does not pump blood efficiently for your body to work well. In some cases of HF, fluid may back up into your lungs or you may have swelling (edema) in your lower legs. HF is a long-term (chronic) condition. It is important for you to take good care of yourself and follow your caregiver's treatment plan. CAUSES   Health conditions:   High blood pressure (hypertension) causes the heart muscle to work harder than normal. When pressure in the blood vessels is high, the heart needs to pump (contract) with more force in order to circulate blood throughout the body. High blood pressure eventually causes the heart to become stiff and weak.   Coronary artery disease (CAD) is the buildup of cholesterol and fat (plaques) in the arteries of the heart. The blockage in the arteries deprives the heart muscle of oxygen and blood. This can cause chest pain and may lead to a heart attack. High blood pressure can also contribute to CAD.   Heart attack (myocardial infarction) occurs when 1 or more arteries in the heart become blocked. The loss of oxygen damages the muscle tissue of the heart. When this happens, part of the heart muscle dies. The injured tissue does not contract as well and weakens the heart's ability to pump blood.   Abnormal heart valves can cause HF when the heart valves do not open and close properly. This makes the heart muscle pump harder to keep the blood flowing.   Heart muscle disease (cardiomyopathy or myocarditis) is damage to the heart muscle from a variety of causes. These can include drug or alcohol abuse, infections, or unknown reasons. These can increase the risk of HF.   Lung disease makes the heart work harder because the lungs do not work properly. This can cause a strain on the heart leading it to fail.   Diabetes increases the risk of HF. High blood sugar contributes  to high fat (lipid) levels in the blood. Diabetes can also cause slow damage to tiny blood vessels that carry important nutrients to the heart muscle. When the heart does not get enough oxygen and food, it can cause the heart to become weak and stiff. This leads to a heart that does not contract efficiently.   Other diseases can contribute to HF. These include abnormal heart rhythms, thyroid problems, and low blood counts (anemia).   Unhealthy lifestyle habits:   Obesity.   Smoking.   Eating foods high in fat and cholesterol.   Eating or drinking beverages high in salt.   Drug or alcohol abuse.   Lack of exercise.  SYMPTOMS  HF symptoms may vary and can be hard to detect. Symptoms may include:  Shortness of breath with activity, such as climbing stairs.   Persistent cough.   Swelling of the feet, ankles, legs, or abdomen.   Unexplained weight gain.   Difficulty breathing when lying flat.   Waking from sleep because of the need to sit up and get more air.   Rapid heartbeat.   Fatigue and loss of energy.   Feeling lightheaded or close to fainting.  DIAGNOSIS  A diagnosis of HF is based on your history, symptoms, physical examination, and diagnostic tests. Diagnostic tests for HF may include:  EKG.   Chest X-ray.   Blood tests.   Exercise stress test.   Blood oxygen test (arterial blood gas).   Evaluation   by a heart doctor (cardiologist).   Ultrasound evaluation of the heart (echocardiogram).   Heart artery test to look for blockages (angiogram).   Radioactive imaging to look at the heart (radionuclide test).  TREATMENT  Treatment is aimed at managing the symptoms of HF. Medicines, lifestyle changes, or surgical intervention may be necessary to treat HF.  Medicines to help treat HF may include:   Angiotensin-converting enzyme (ACE) inhibitors. These block the effects of a blood protein called angiotensin-converting enzyme. ACE inhibitors relax (dilate) the  blood vessels and help lower blood pressure. This decreases the workload of the heart, slows the progression of HF, and improves symptoms.   Angiotensin receptor blockers (ARBs). These medications work similar to ACE inhibitors. ARBs may be an alternative for people who cannot tolerate an ACE inhibitor.   Aldosterone antagonists. This medication helps get rid of extra fluid from your body. This lowers the volume of blood the heart has to pump.   Water pills (diuretics). Diuretics cause the kidneys to remove salt and water from the blood. The extra fluid is removed by urination. By removing extra fluid from the body, diuretics help lower the workload of the heart and help prevent fluid buildup in the lungs so breathing is easier.   Beta blockers. These prevent the heart from beating too fast and improve heart muscle strength. Beta blockers help maintain a normal heart rate, control blood pressure, and improve HF symptoms.   Digitalis. This increases the force of the heartbeat and may be helpful to people with HF or heart rhythm problems.   Healthy lifestyle changes include:   Stopping smoking.   Eating a healthy diet. Avoid foods high in fat. Avoid foods fried in oil or made with fat. A dietician can help with healthy food choices.   Limiting how much salt you eat.   Limiting alcohol intake to no more than 1 drink per day for women and 2 drinks per day for men. Drinking more than that is harmful to your heart. If your heart has already been damaged by alcohol or you have severe HF, drinking alcohol should be stopped completely.   Exercising as directed by your caregiver.   Surgical treatment for HF may include:   Procedures to open blocked arteries, repair damaged heart valves, or remove damaged heart muscle tissue.   A pacemaker to help heart muscle function and to control certain abnormal heart rhythms.   A defibrillator to possibly prevent sudden cardiac death.  HOME CARE  INSTRUCTIONS   Activity level. Your caregiver can help you determine what type of exercise program may be helpful. It is important to maintain your strength. Pace your physical activity to avoid shortness of breath or chest pain. Rest for 1 hour before and after meals. A cardiac rehabilitation program may be helpful to some people with HF.   Diet. Eat a heart healthy diet. Food choices should be low in saturated fat and cholesterol. Talk to a dietician to learn about heart healthy foods.   Salt intake. When you have HF, you need to limit the amount of salt you eat. Eat less than 1500 milligrams (mg) of salt per day or as recommended by your caregiver.   Weight monitoring. Weigh yourself every day. You should weigh yourself in the morning after you urinate and before you eat breakfast. Wear the same amount of clothing each time you weigh yourself. Record your weight daily. Bring your recorded weights to your clinic visits. Tell your caregiver right away if   you have gained 3 lb/1.4 kg in 1 day, or 5 lb/2.3 kg in a week or whatever amount you were told to report.   Blood pressure monitoring. This should be done as directed by your caregiver. A home blood pressure cuff can be purchased at a drugstore. Record your blood pressure numbers and bring them to your clinic visits. Tell your caregiver if you become dizzy or lightheaded upon standing up.   Smoking. If you are currently a smoker, it is time to quit. Nicotine makes your heart work harder by causing your blood vessels to constrict. Do not use nicotine gum or patches before talking to your caregiver.   Follow up. Be sure to schedule a follow-up visit with your caregiver. Keep all your appointments.  SEEK MEDICAL CARE IF:   Your weight increases by 3 lb/1.4 kg in 1 day or 5 lb/2.3 kg in a week.   You notice increasing shortness of breath that is unusual for you. This may happen during rest, sleep, or with activity.   You cough more than normal,  especially with physical activity.   You notice more swelling in your hands, feet, ankles, or belly (abdomen).   You are unable to sleep because it is hard to breathe.   You cough up bloody mucus (sputum).   You begin to feel "jumping" or "fluttering" sensations (palpitations) in your chest.  SEEK IMMEDIATE MEDICAL CARE IF:   You have severe chest pain or pressure which may include symptoms such as:   Pain or pressure in the arms, neck, jaw, or back.   Feeling sweaty.   Feeling sick to your stomach (nauseous).   Feeling short of breath while at rest.   Having a fast or irregular heartbeat.   You experience stroke symptoms. These symptoms include:   Facial weakness or numbness.   Weakness or numbness in an arm, leg, or on one side of your body.   Blurred vision.   Difficulty talking or thinking.   Dizziness or fainting.   Severe headache.  THESE ARE MEDICAL EMERGENCIES. Do not wait to see if the symptoms go away. Call your local emergency services (911 in [place] DO NOT drive yourself to the hospital. IMPORTANT  Make a list of every medicine, vitamin, or herbal supplement you are taking. Keep the list with you at all times. Show it to your caregiver at every visit. Keep the list up-to-date.   Ask your caregiver or pharmacist to write an explanation of each medicine you are taking. This should include:   Why you are taking it.   The possible side effects.   The best time of day to take it.   Foods to take with it or what foods to avoid.   When to stop taking it.  MAKE SURE YOU:   Understand these instructions.   Will watch your condition.   Will get help right away if you are not doing well or get worse.  Document Released: 12/05/2005 Document Revised: 11/24/2011 Document Reviewed: 03/19/2010 [employer] Patient Information 2012 [employer]. 

## 2012-05-10 NOTE — Progress Notes (Signed)
Porta cath deaccessed. Flushed with 10cc NS followed by Heparin 5ml (100u/ml). No bleeding to site, band aid to site for comfort. Ivyanna Sibert M 

## 2012-05-10 NOTE — Progress Notes (Signed)
IP PROGRESS NOTE  Subjective:   66 yo with hx of T1B NO breast cancer diagnosed in2009, no adjuvant therapy, recurred in 2012 with small chest wall nodule, s/p C2 TC chemotherapy given in "pseudoadjuvant "fashion, admittted with sob and low grade fever.she is feeling better since admission and some lasix.  Objective:  Vital signs in last 24 hours: Temp:  [97.6 F (36.4 C)-98.3 F (36.8 C)] 97.9 F (36.6 C) (05/22 2238) Pulse Rate:  [88-103] 93  (05/22 2238) Resp:  [18] 18  (05/22 2238) BP: (106-160)/(65-82) 117/69 mmHg (05/22 2238) SpO2:  [86 %-100 %] 97 % (05/22 2238) Weight:  [138 lb 6.4 oz (62.778 kg)] 138 lb 6.4 oz (62.778 kg) (05/22 0944) Weight change:  Last BM Date: 05/08/12  Intake/Output from previous day: 05/22 0701 - 05/23 0700 In: 1170 [P.O.:840; I.V.:180; IV Piggyback:50] Out: -   Mouth: mucous membranes moist, pharynx normal without lesions Resp: clear to auscultation bilaterally Cardio: regular rate and rhythm, S1, S2 normal, no murmur, click, rub or gallop GI: soft, non-tender; bowel sounds normal; no masses,  no organomegaly Extremities: no edema, redness or tenderness in the calves or thighs  Portacath/PICC-without erythema  Lab Results:  Basename 05/09/12 0534 05/07/12 1444  WBC 21.9* 14.2*  HGB 11.2* 11.7  HCT 33.1* 35.9  PLT 474* 392    BMET  Basename 05/09/12 0534 05/07/12 1444  NA 132* 141  K 4.4 4.6  CL 98 102  CO2 24 31  GLUCOSE 124* 173*  BUN 20 19  CREATININE 0.60 0.86  CALCIUM 8.9 9.8    Studies/Results: Dg Chest 2 View  05/09/2012  *RADIOLOGY REPORT*  Clinical Data: Shortness of breath, cough.  CHEST - 2 VIEW  Comparison: 04/27/2012  Findings: Left chest wall Port-A-Cath with tip projecting over the brachiocephalic/SVC confluence.  Small left pleural effusion with associated opacity blunts the posterior costophrenic sulcus.  Mild hazy opacities in the right lower lobe may be exaggerated by overlying soft tissue/breast prosthesis.  Even allowing for this, unable to exclude a perihilar/right middle lobe process.  No pneumothorax.  Left apical capping and left paramediastinal opacity/scarring.  Atherosclerotic aortic arch.  Heart size upper normal to mildly enlarged.  Osteopenia.  No definite acute osseous finding.  IMPRESSION: Mild left lower and right perihilar opacity may reflect atelectasis or pneumonia with small left pleural effusion.  Original Report Authenticated By: Waneta Martins, M.D.   Ct Angio Chest W/cm &/or Wo Cm  05/09/2012  *RADIOLOGY REPORT*  Clinical Data: Cough, shortness of breath.  CT ANGIOGRAPHY CHEST  Technique:  Multidetector CT imaging of the chest using the standard protocol during bolus administration of intravenous contrast. Multiplanar reconstructed images including MIPs were obtained and reviewed to evaluate the vascular anatomy.  Contrast: OMNIPAQUE IOHEXOL 300 MG/ML  SOLN  Comparison: 04/17/2012 PET-CT, 12/08/2003 chest CT, 03/31/2004 chest CT.  Findings: Linear paramediastinal opacities, most in keeping with scarring / radiation changes.  There is interval increased hazy opacity of the lungs and more confluent patchy ground-glass opacities within the right lower lobe.  Small bilateral pleural effusions.  Respiratory motion degrades detailed parenchymal evaluation. Right greater than left peribronchial thickening.  Mild debris within the right lower lobe bronchioles. No pneumothorax.  Respiratory motion degrades evaluation of the lower lobe segmental branches.  Otherwise, no pulmonary arterial branch filling defect is identified. Scattered atherosclerotic calcification of the aorta and branch vessels.  No dissection.  Heart size upper normal limits to mildly enlarged.  Coronary artery calcification.  Right breast  prosthesis.  No intrathoracic lymphadenopathy.  Left chest wall Port-A-Cath, with catheter tip terminating at the cavoatrial junction.  Advanced osteopenia.  Numerous lucent foci scattered  throughout vertebral bodies.  Irregular, resorptive pattern of the left posterior sixth rib, with evidence of prior fracture.  Limited images through the upper abdomen demonstrate no acute abnormality.  IMPRESSION:  No pulmonary embolism identified.  Lower lobe peripheral branches are degraded by respiratory motion.  Bilateral pleural effusions. Peribronchial cuffing and areas of scattered ground-glass opacity, right lower lobe predominant, may reflect infection or edema.  Post radiation changes of the paramediastinal lungs.  Osseous scattered lucencies without cortical breakthrough, nonspecific.  May be secondary to advanced osteopenia however cannot entirely exclude other processes such as metastatic disease or myeloma.  No osseous hypermetabolic activity was appreciated on recent PET CT.  Original Report Authenticated By: Waneta Martins, M.D.    Medications: I have reviewed the patient's current medications.  Assessment/Plan:  66 yo with previousw hx of hodgkins and breast cancer, with 2nd admission after 2nd cycle of TC chemotherapy. Imaging does not show definitive evidence for PNA. I recommended an echo , given her hx and BNP. I would narrow down the antibiotice regimen as well. We will continue neupogen in house   LOS: 1 day   Madisun Hargrove 05/10/2012, 5:36 AM

## 2012-05-10 NOTE — Telephone Encounter (Signed)
S/w the pt and she is aware of her inj appts for may 24th and 05/12/2012

## 2012-05-11 ENCOUNTER — Other Ambulatory Visit: Payer: Self-pay | Admitting: Certified Registered Nurse Anesthetist

## 2012-05-11 ENCOUNTER — Ambulatory Visit (HOSPITAL_BASED_OUTPATIENT_CLINIC_OR_DEPARTMENT_OTHER): Payer: Medicare Other

## 2012-05-11 ENCOUNTER — Other Ambulatory Visit: Payer: Self-pay | Admitting: Oncology

## 2012-05-11 VITALS — BP 117/69 | HR 82 | Temp 98.4°F

## 2012-05-11 DIAGNOSIS — C50919 Malignant neoplasm of unspecified site of unspecified female breast: Secondary | ICD-10-CM | POA: Diagnosis not present

## 2012-05-11 DIAGNOSIS — N6310 Unspecified lump in the right breast, unspecified quadrant: Secondary | ICD-10-CM

## 2012-05-11 DIAGNOSIS — Z5189 Encounter for other specified aftercare: Secondary | ICD-10-CM | POA: Diagnosis not present

## 2012-05-11 MED ORDER — PEGFILGRASTIM INJECTION 6 MG/0.6ML: 6.0000 mg | Freq: Once | SUBCUTANEOUS | Status: DC

## 2012-05-11 MED ORDER — FILGRASTIM 300 MCG/0.5ML IJ SOLN
300.0000 ug | Freq: Every day | INTRAMUSCULAR | Status: DC
  Administered 2012-05-11: 300 ug via SUBCUTANEOUS
  Filled 2012-05-11: qty 0.5

## 2012-05-12 ENCOUNTER — Ambulatory Visit (HOSPITAL_BASED_OUTPATIENT_CLINIC_OR_DEPARTMENT_OTHER): Payer: Medicare Other

## 2012-05-12 VITALS — BP 85/50 | HR 79 | Temp 97.0°F

## 2012-05-12 DIAGNOSIS — T451X5A Adverse effect of antineoplastic and immunosuppressive drugs, initial encounter: Secondary | ICD-10-CM | POA: Diagnosis not present

## 2012-05-12 DIAGNOSIS — D6481 Anemia due to antineoplastic chemotherapy: Secondary | ICD-10-CM | POA: Diagnosis not present

## 2012-05-12 DIAGNOSIS — C50519 Malignant neoplasm of lower-outer quadrant of unspecified female breast: Secondary | ICD-10-CM

## 2012-05-12 DIAGNOSIS — C50919 Malignant neoplasm of unspecified site of unspecified female breast: Secondary | ICD-10-CM

## 2012-05-12 MED ORDER — FILGRASTIM 300 MCG/0.5ML IJ SOLN
300.0000 ug | Freq: Every day | INTRAMUSCULAR | Status: DC
  Administered 2012-05-12: 300 ug via SUBCUTANEOUS

## 2012-05-12 NOTE — Patient Instructions (Signed)
Pt stated that she almost passed out walking in to clinic.  Pt states that she was just in the hospital with volume overload.  Pt has a long standing cardiac history and refused fluids today.  Pt stated she just wants to go home and rest.  Pt instructed to increase hydration using gatorade cut with water, pedialyte etc.  Pt encouraged to call and come to ER if she continues to feel bad or gets worse.  Pt verbalized understanding and agreed.  Pt discharged home ambulatory with RN's assistance.

## 2012-05-15 ENCOUNTER — Other Ambulatory Visit (HOSPITAL_BASED_OUTPATIENT_CLINIC_OR_DEPARTMENT_OTHER): Payer: Medicare Other

## 2012-05-15 ENCOUNTER — Other Ambulatory Visit: Payer: Self-pay | Admitting: *Deleted

## 2012-05-15 ENCOUNTER — Telehealth: Payer: Self-pay | Admitting: Cardiovascular Disease

## 2012-05-15 DIAGNOSIS — C50419 Malignant neoplasm of upper-outer quadrant of unspecified female breast: Secondary | ICD-10-CM

## 2012-05-15 LAB — CBC WITH DIFFERENTIAL/PLATELET
Basophils Absolute: 0 10*3/uL (ref 0.0–0.1)
Eosinophils Absolute: 0.1 10*3/uL (ref 0.0–0.5)
HCT: 31.5 % — ABNORMAL LOW (ref 34.8–46.6)
HGB: 10.7 g/dL — ABNORMAL LOW (ref 11.6–15.9)
LYMPH%: 43.4 % (ref 14.0–49.7)
MCV: 89 fL (ref 79.5–101.0)
MONO%: 45.4 % — ABNORMAL HIGH (ref 0.0–14.0)
NEUT#: 0.1 10*3/uL — CL (ref 1.5–6.5)
NEUT%: 7.2 % — ABNORMAL LOW (ref 38.4–76.8)
Platelets: 331 10*3/uL (ref 145–400)
RDW: 13.6 % (ref 11.2–14.5)

## 2012-05-15 MED ORDER — LEVOFLOXACIN 500 MG PO TABS: 500.0000 mg | ORAL_TABLET | Freq: Every day | ORAL | Status: DC

## 2012-05-15 NOTE — Telephone Encounter (Signed)
Pt has stopped taking her losartin and she has some questions

## 2012-05-15 NOTE — Telephone Encounter (Signed)
Pt aware. Debbie Lineth Thielke RN  

## 2012-05-15 NOTE — Telephone Encounter (Signed)
Can hold losartan

## 2012-05-15 NOTE — Telephone Encounter (Signed)
Pt is taking chemotherapy for recurrent breast cancer and has been "dragging, no energy" lately.  She was also recently in hospital with volume overload and given script for lasix.  She has not needed the lasix.  Her next chemo tx is 05/29/12, She has not experienced any "fluttering" since May 3,2013. Blood pressure was taken Last Saturday before treatment  80/50 Pt stopped Losartan last Friday BP today:  116/65, 105/65 She is feeling better but energy level is still low according to pt. Pt would like Dr. Eden Emms to review if she needs to restart Losartan and what BP parameters should be for her.  Mylo Red RN

## 2012-05-18 ENCOUNTER — Other Ambulatory Visit: Payer: Medicare Other | Admitting: Lab

## 2012-05-18 ENCOUNTER — Ambulatory Visit (HOSPITAL_BASED_OUTPATIENT_CLINIC_OR_DEPARTMENT_OTHER): Payer: Medicare Other | Admitting: Oncology

## 2012-05-18 VITALS — BP 108/68 | HR 85 | Temp 98.9°F | Ht 62.0 in | Wt 141.7 lb

## 2012-05-18 DIAGNOSIS — I4891 Unspecified atrial fibrillation: Secondary | ICD-10-CM | POA: Diagnosis not present

## 2012-05-18 DIAGNOSIS — Z8571 Personal history of Hodgkin lymphoma: Secondary | ICD-10-CM | POA: Diagnosis not present

## 2012-05-18 DIAGNOSIS — E039 Hypothyroidism, unspecified: Secondary | ICD-10-CM | POA: Diagnosis not present

## 2012-05-18 DIAGNOSIS — C50919 Malignant neoplasm of unspecified site of unspecified female breast: Secondary | ICD-10-CM

## 2012-05-18 MED ORDER — LEVOFLOXACIN 500 MG PO TABS: 500.0000 mg | ORAL_TABLET | Freq: Every day | ORAL | Status: DC

## 2012-05-18 NOTE — Progress Notes (Signed)
Hematology and Oncology Follow Up Visit  [name] [id number] 1946/04/07 66 y.o. 05/18/2012    HPI: [name] is a 66 year old [place] woman with a history of a T1 B., N0 triple negative right breast carcinoma, tentatively due for day 1 cycle 2 of 4 planned adjuvant every 3 week Taxotere/Cytoxan on 05/08/2012. 2. Recent hospitalization for febrile neutropenia. 3. History of atrial fibrillation, LBBB, hypertension. 4. Remote history of Hodgkin's disease. 5. Hypothyroidism.  Interim History:    [name] is seen today in anticipation of day 7 cycle 2 of 4 planned every 3 week Taxotere/Cytoxan given in the adjuvant setting for her history of a T1b, N0 triple negative right breast carcinoma. Of note, she was hospitalized between 510-04/30/2012 for febrile neutropenia.Cultures were negative. She was discharged on Levaquin 500 mg, for which he has 2 more days to go. She is feeling much better, except for this past weekend when she had a low-grade temp of 100.1 on 5/18-05/19/2013. This morning, she reports of temp of 99.7. She is currently afebrile. She has occasional nonproductive cough but otherwise feels pretty well without dysuria, she is a bit of redness around her port site, but notes that this has been there for quite some time. She denies nausea, emesis, diarrhea or constipation. She's had no shortness of breath chest pain or palpitations. A detailed review of systems is otherwise noncontributory as noted below.  Review of Systems: Constitutional:  no weight loss, fever, night sweats, feels well and fatigued Eyes: uses glasses ENT: no complaints Cardiovascular: no chest pain or dyspnea on exertion Respiratory: positive for - cough Neurological: no TIA or stroke symptoms Dermatological: negative Gastrointestinal: no abdominal pain, change in bowel habits, or black or bloody stools Genito-Urinary: no dysuria, trouble voiding, or hematuria Hematological and Lymphatic:  negative Breast: negative Musculoskeletal: negative Remaining ROS negative.   Medications:   I have reviewed the patient's current medications.  Current Outpatient Prescriptions  Medication Sig Dispense Refill  . Alum & Mag Hydroxide-Simeth (MAGIC MOUTHWASH W/LIDOCAINE) SOLN Take 15 mLs by mouth 4 (four) times daily as needed.  60 mL  3  . calcium carbonate (OS-CAL) 600 MG TABS Take 600 mg by mouth 2 (two) times daily with a meal.       . carvedilol (COREG) 12.5 MG tablet Take 18.75 mg by mouth 2 (two) times daily with a meal.      . Cholecalciferol (VITAMIN D3) 3000 UNITS TABS Take 3,000 Units by mouth daily.      Marland Kitchen dexamethasone (DECADRON) 4 MG tablet Take 8 mg by mouth 2 (two) times daily with a meal. Take 2 tablets twice a day before & after chemo days      . furosemide (LASIX) 40 MG tablet Take 1 tablet (40 mg total) by mouth daily as needed (Take as needed for shortness of breath or greater than 3 pound weight gain/24 hours.).  30 tablet  2  . levofloxacin (LEVAQUIN) 500 MG tablet Take 1 tablet (500 mg total) by mouth daily.  7 tablet  0  . levothyroxine (SYNTHROID, LEVOTHROID) 125 MCG tablet Take 125 mcg by mouth daily before breakfast.       . lidocaine-prilocaine (EMLA) cream Apply to port site 1 to 3 hours prior to use  30 g  1  . magnesium oxide (MAG-OX 400) 400 MG tablet Take 400 mg by mouth 2 (two) times daily.       . Multiple Vitamin (MULTIVITAMIN) tablet Take 2 tablets by mouth daily. Natural multivitamin      .  naproxen sodium (ANAPROX) 220 MG tablet Take 220 mg by mouth 2 (two) times daily as needed. Pain       . ondansetron (ZOFRAN) 8 MG tablet Take 8 mg by mouth every 8 (eight) hours as needed. For nausea      . OVER THE COUNTER MEDICATION Take 1 tablet by mouth 2 (two) times daily. osteodenx supplement       . OVER THE COUNTER MEDICATION Take 3 tablets by mouth daily at 12 noon. Vegetarian omega green supplement       . OVER THE COUNTER MEDICATION Take 2 capsules by  mouth every morning. mental clarity supplement       . prochlorperazine (COMPAZINE) 10 MG tablet 1 tab po every 6 to 8 hours as needed for breakthrough nausea  30 tablet  0    Allergies:  Allergies  Allergen Reactions  . Benazepril     cough  . Epinephrine     REACTION: BUZZ  . Iohexol      Desc: SNEEZING, pt states this happened in 1988. Has had iv contrast since without being premedicated and has had no problems. Chest ct 2005- LC 05/09/12     Physical Exam: Filed Vitals:   05/18/12 1335  BP: 108/68  Pulse: 85  Temp: 98.9 F (37.2 C)    Body mass index is 25.92 kg/(m^2). Weight: 140 lbs. HEENT:  Sclerae anicteric, conjunctivae pink.  Oropharynx clear.  No mucositis or candidiasis.   Nodes:  No cervical, supraclavicular, or axillary lymphadenopathy palpated.  Breast Exam: Deferred.   Lungs:  Clear to auscultation bilaterally.  No crackles, rhonchi, or wheezes.   Heart:  Regular rate and rhythm,  slight easily blanchable erythema on the lateral aspect of the port site. Abdomen:  Soft, nontender.  Positive bowel sounds.  No organomegaly or masses palpated.   Musculoskeletal:  No focal spinal tenderness to palpation.  Extremities:  Benign.  No peripheral edema or cyanosis.   Skin:  Benign.   Neuro:  Nonfocal, alert and oriented x 3.   Lab Results: Lab Results  Component Value Date   WBC 1.5* 05/15/2012   HGB 10.7* 05/15/2012   HCT 31.5* 05/15/2012   MCV 89.0 05/15/2012   PLT 331 05/15/2012   NEUTROABS 0.1* 05/15/2012     Chemistry      Component Value Date/Time   NA 136 05/10/2012 0540   K 4.1 05/10/2012 0540   CL 101 05/10/2012 0540   CO2 28 05/10/2012 0540   BUN 25* 05/10/2012 0540   CREATININE 0.63 05/10/2012 0540      Component Value Date/Time   CALCIUM 8.1* 05/10/2012 0540   ALKPHOS 103 05/07/2012 1444   AST 14 05/07/2012 1444   ALT 14 05/07/2012 1444   BILITOT 0.3 05/07/2012 1444      Lab Results  Component Value Date   LABCA2 32 02/17/2012    Radiological  Studies: X-ray Chest Pa And Lateral  04/27/2012  *RADIOLOGY REPORT*  Clinical Data: Fever, neutropenia  CHEST - 2 VIEW  Comparison: 04/16/2012  Findings: Cardiomediastinal silhouette is stable.  Left Port-A-Cath with tip in SVC again noted.  Stable chronic pleural thickening left lower lobe laterally and left apex. Stable osteopenia and mild degenerative changes thoracic spine.  No acute infiltrate or pulmonary edema.  IMPRESSION: No active disease.  No significant change.  Original Report Authenticated By: Natasha Mead, M.D.   Dg Chest 2 View 04/12/2012  *RADIOLOGY REPORT*  Clinical Data: 66 year old female preoperative study for Port-A- Cath.  Recurrent breast cancer.  Prior surgery and radiation.  CHEST - 2 VIEW  Comparison: 02/29/2008.  Findings: Stable lung volumes.  Stable left apical opacity and surgical clips.  Postoperative changes to the right chest wall. Stable cardiac size and mediastinal contours.  Left superior mediastinal opacity probably the sequelae of radiation.  No pneumothorax, pulmonary edema, pleural effusion or acute pulmonary opacity. Stable visualized osseous structures.  IMPRESSION: Postoperative/post therapy changes to the chest. No acute or metastatic cardiopulmonary abnormality.  Original Report Authenticated By: Harley Hallmark, M.D.   Nm Pet Image Restag (ps) Skull Base To Thigh 04/17/2012  *RADIOLOGY REPORT*  Clinical Data: Subsequent treatment strategy for breast cancer. Patient status post mastectomy 04/29/2008.  Recurrence of  right breast 03/28/2012.  Triple negative breast cancer  NUCLEAR MEDICINE PET SKULL BASE TO THIGH  Fasting Blood Glucose:  94  Technique:  17.4 mCi F-18 FDG was injected intravenously.   CT data was obtained and used for attenuation correction and anatomic localization only.  (This was not acquired as a diagnostic CT examination.) Additional exam technical data entered  on technologist worksheet.  Comparison: breast MRI 03/07/2012  Findings:  Neck: No  hypermetabolic nodes in the neck.  Chest: There is a port in the left anterior chest wall.  Breast implant on the right.  Mild hypermetabolic activity in the skin over the implant.  No hypermetabolic right axillary nodes.  No hypermetabolic internal mammary nodes.  No hypermetabolic mediastinal nodes.  Review of lung parenchyma demonstrates volume loss of pleural thickening in the left upper lobe suggesting radiation injury.  No focal hypermetabolic activity.  No suspicious pulmonary nodules.  Abdomen / Pelvis:No abnormal hypermetabolic activity within the solid organs.  No evidence of abdominal or pelvic hypermetabolic nodes.  Skeleton:No focal hypermetabolic activity to suggest skeletal metastasis.  IMPRESSION: No evidence of breast cancer metastasis.  Original Report Authenticated By: Genevive Bi, M.D.   Dg Chest Port 1 View 04/16/2012  *RADIOLOGY REPORT*  Clinical Data: Port-A-Cath placement, also history breast carcinoma and remote history of Hodgkin's disease  PORTABLE CHEST - 1 VIEW  Comparison: Chest x-ray of 04/12/2012  Findings: A left-sided power port Port-A-Cath is present with the tip near the expected SVC - RA junction.  Chronic changes in the left hemithorax are stable with pleural thickening overlying the apex and at the left costophrenic angle.  The right lung is clear. Heart size is stable.  IMPRESSION: Left-sided Port-A-Cath tip in lower SVC. Chronic changes on the left.  Original Report Authenticated By: Juline Patch, M.D.   Dg C-arm 1-60 Min-no Report 04/16/2012  CLINICAL DATA: breast cancer   C-ARM 1-60 MINUTES  Fluoroscopy was utilized by the requesting physician.  No radiographic  interpretation.       Assessment:  [name] is a 66 year old [place] woman with a history of a T1 B., N0 triple negative right breast carcinoma, tentatively due for day 1 cycle 2 of 4 planned adjuvant every 3 week Taxotere/Cytoxan on 05/08/2012.  2. Recent hospitalization for febrile  neutropenia, after failing to present on day 2 for Neulasta following her first cycle.  3. History of atrial fibrillation, LBBB, hypertension.  4. Remote history of Hodgkin's disease.  5. Hypothyroidism.  Case reviewed with Dr. Welton Flakes in Dr. Renelda Loma absence.  Plan:  [name] will present tomorrow for day 1 cycle 2 of every three-week Taxotere/Cytoxan scheduled. She will receive Neulasta on day 2. She was instructed to complete out her Levaquin antibiotic therapy. We will plan on seeing  her in one week's time for nadir assessment, she knows to contact us sooner if the need should arise. This plan was reviewed with the patient, who voices understanding and agreement.  She knows to call with any changes or problems.    Richardson Chiquito 05/18/2012

## 2012-05-21 ENCOUNTER — Encounter: Payer: Self-pay | Admitting: Oncology

## 2012-05-21 ENCOUNTER — Other Ambulatory Visit: Payer: Self-pay | Admitting: Oncology

## 2012-05-21 NOTE — Progress Notes (Signed)
Put [employer] form on nurse's desk °

## 2012-05-22 ENCOUNTER — Other Ambulatory Visit (HOSPITAL_BASED_OUTPATIENT_CLINIC_OR_DEPARTMENT_OTHER): Payer: Medicare Other | Admitting: Lab

## 2012-05-22 DIAGNOSIS — C50919 Malignant neoplasm of unspecified site of unspecified female breast: Secondary | ICD-10-CM

## 2012-05-22 LAB — CBC & DIFF AND RETIC
Eosinophils Absolute: 0 10*3/uL (ref 0.0–0.5)
LYMPH%: 12.9 % — ABNORMAL LOW (ref 14.0–49.7)
MCH: 30.5 pg (ref 25.1–34.0)
MCV: 91.4 fL (ref 79.5–101.0)
MONO%: 7.7 % (ref 0.0–14.0)
NEUT#: 10.5 10*3/uL — ABNORMAL HIGH (ref 1.5–6.5)
Platelets: 319 10*3/uL (ref 145–400)
RBC: 3.38 10*6/uL — ABNORMAL LOW (ref 3.70–5.45)
nRBC: 0 % (ref 0–0)

## 2012-05-25 ENCOUNTER — Encounter: Payer: Self-pay | Admitting: Oncology

## 2012-05-25 NOTE — Progress Notes (Signed)
Put [employer] form in registration desk °

## 2012-05-28 ENCOUNTER — Other Ambulatory Visit: Payer: Self-pay | Admitting: Physician Assistant

## 2012-05-28 DIAGNOSIS — C50919 Malignant neoplasm of unspecified site of unspecified female breast: Secondary | ICD-10-CM

## 2012-05-28 IMAGING — CR DG CHEST 2V
2 series · 2 of 2 positions shown · non-contrast
Comparison: 04/27/2012

CLINICAL DATA: Shortness of breath, cough.

CHEST - 2 VIEW

[w chest pa]
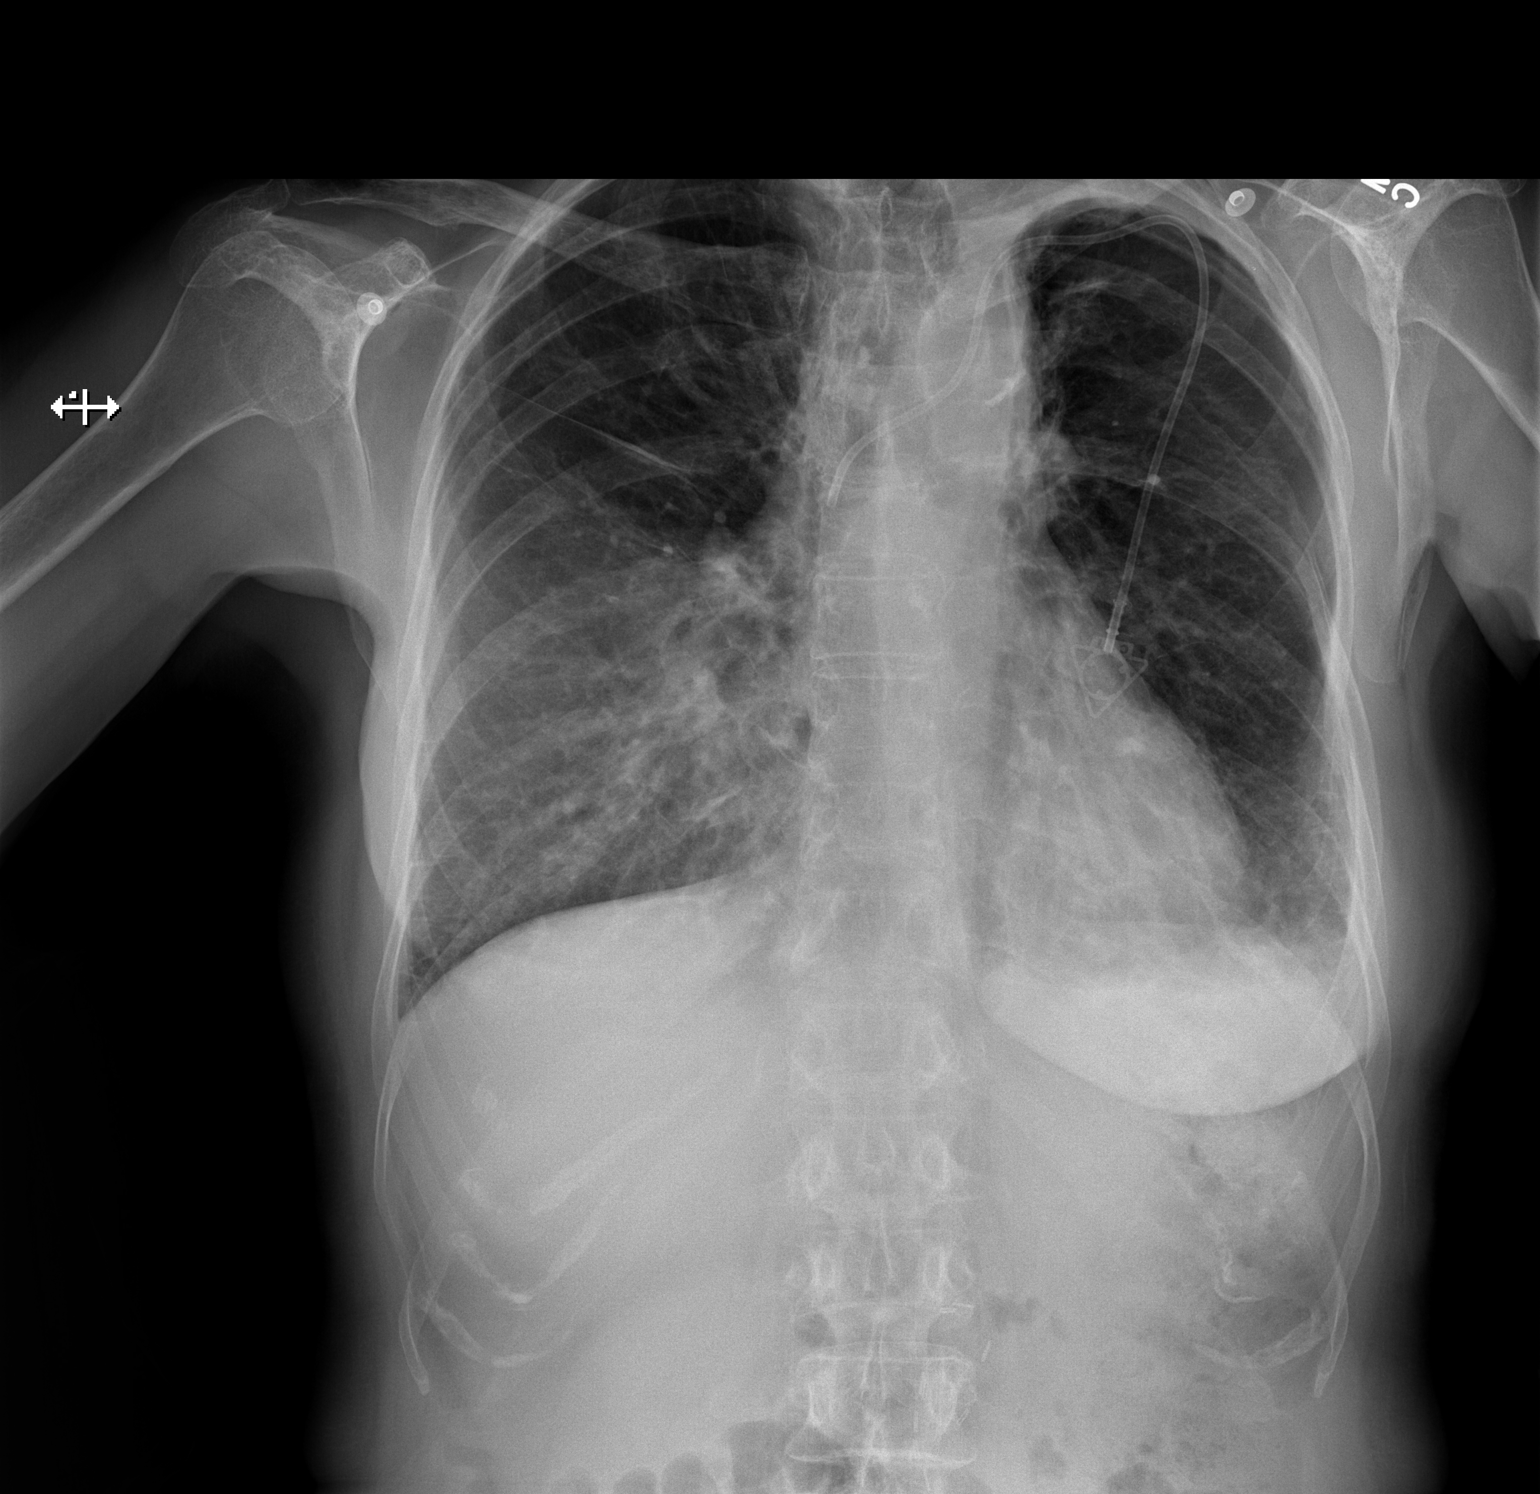

[w chest lat]
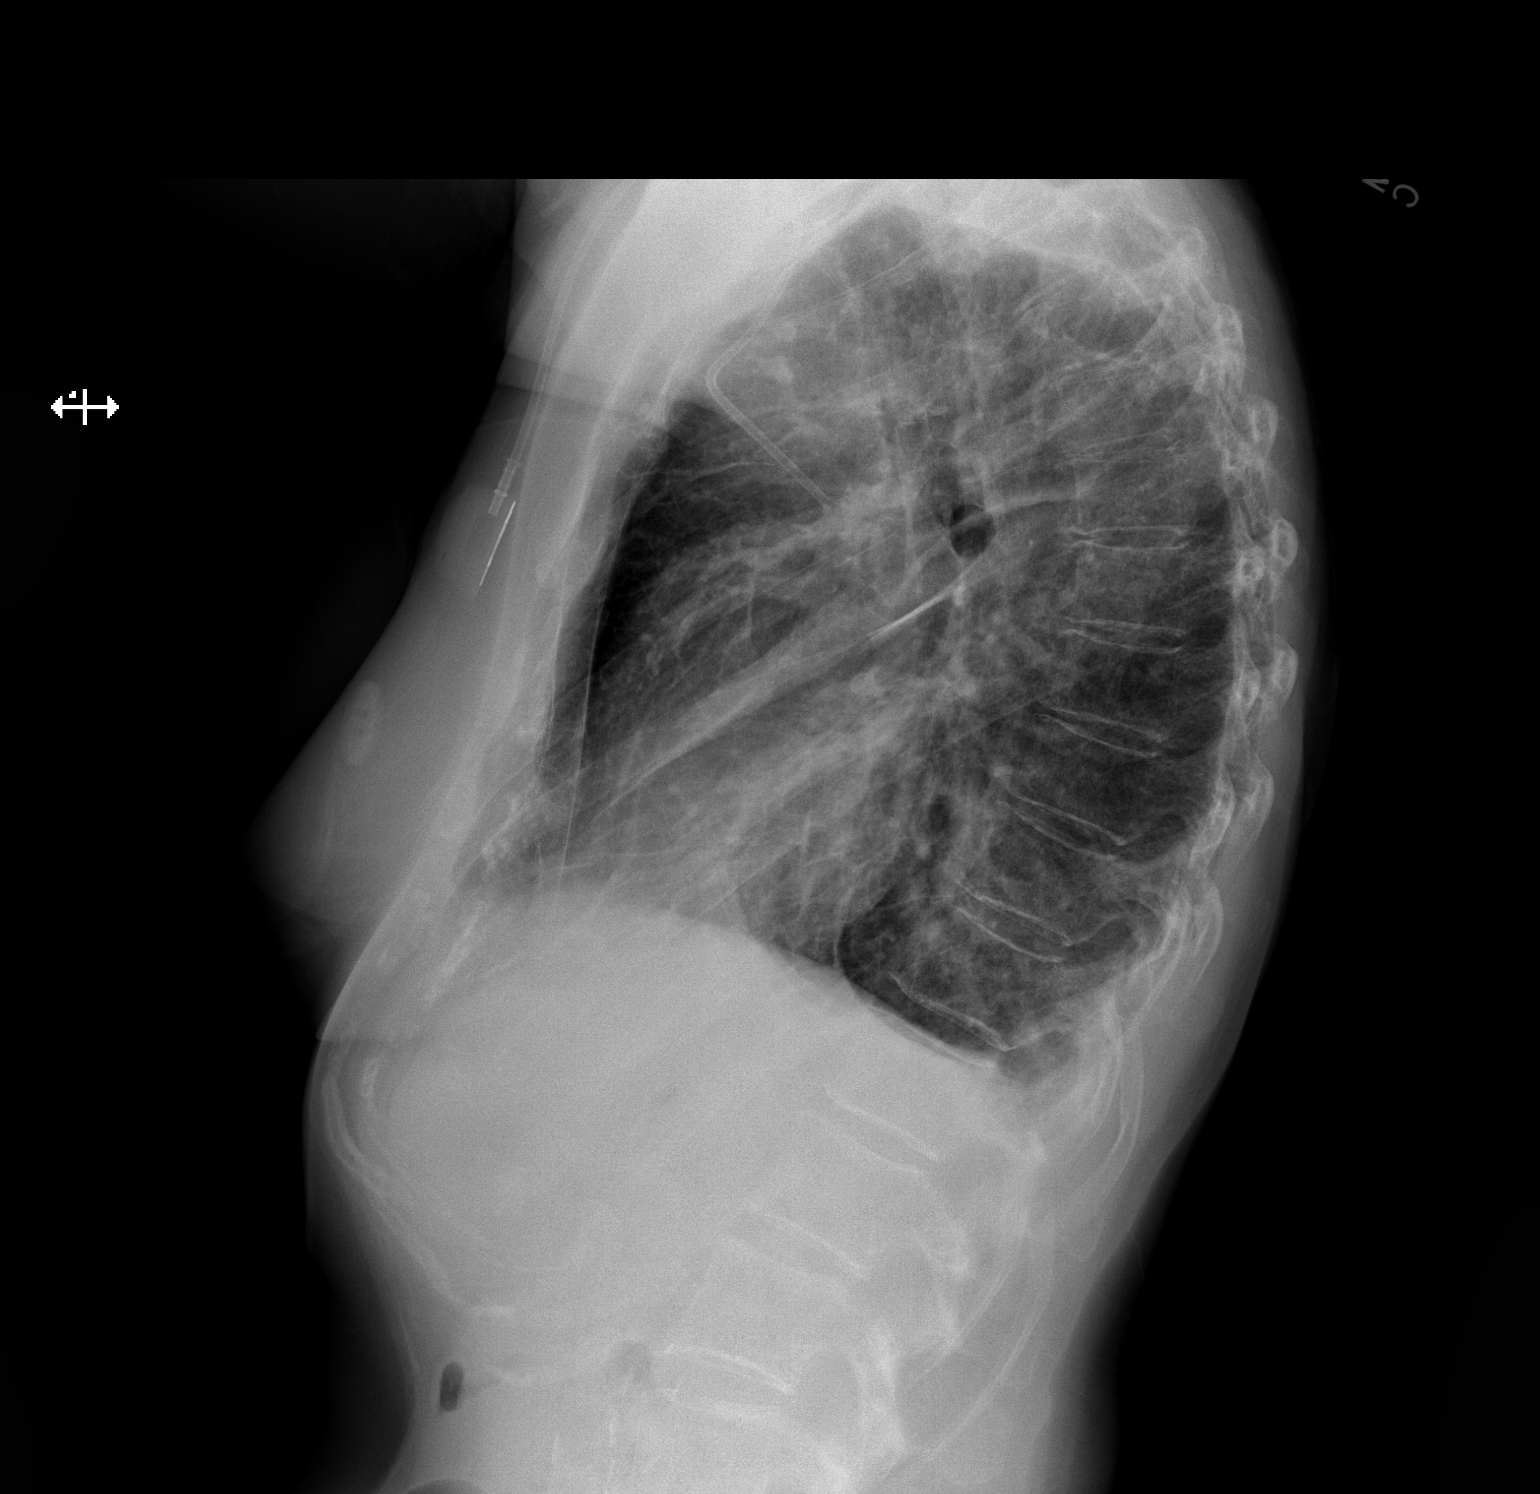

[2 of 2 positions shown; findings below may reference images not displayed]

FINDINGS: Left chest wall Port-A-Cath with tip projecting over the
brachiocephalic/SVC confluence.  Small left pleural effusion with
associated opacity blunts the posterior costophrenic sulcus.  Mild
hazy opacities in the right lower lobe may be exaggerated by
overlying soft tissue/breast prosthesis. Even allowing for this,
unable to exclude a perihilar/right middle lobe process.  No
pneumothorax.  Left apical capping and left paramediastinal
opacity/scarring.  Atherosclerotic aortic arch.  Heart size upper
normal to mildly enlarged.  Osteopenia.  No definite acute osseous
finding.
IMPRESSION: Mild left lower and right perihilar opacity may reflect atelectasis
or pneumonia with small left pleural effusion.

## 2012-05-29 ENCOUNTER — Other Ambulatory Visit (HOSPITAL_BASED_OUTPATIENT_CLINIC_OR_DEPARTMENT_OTHER): Payer: Medicare Other | Admitting: Lab

## 2012-05-29 ENCOUNTER — Ambulatory Visit (HOSPITAL_BASED_OUTPATIENT_CLINIC_OR_DEPARTMENT_OTHER): Payer: Medicare Other | Admitting: Physician Assistant

## 2012-05-29 ENCOUNTER — Ambulatory Visit (HOSPITAL_BASED_OUTPATIENT_CLINIC_OR_DEPARTMENT_OTHER): Payer: Medicare Other

## 2012-05-29 VITALS — BP 162/78 | HR 108 | Temp 98.3°F | Ht 62.0 in | Wt 144.2 lb

## 2012-05-29 DIAGNOSIS — Z8571 Personal history of Hodgkin lymphoma: Secondary | ICD-10-CM | POA: Diagnosis not present

## 2012-05-29 DIAGNOSIS — C50919 Malignant neoplasm of unspecified site of unspecified female breast: Secondary | ICD-10-CM

## 2012-05-29 DIAGNOSIS — Z5111 Encounter for antineoplastic chemotherapy: Secondary | ICD-10-CM

## 2012-05-29 DIAGNOSIS — C50419 Malignant neoplasm of upper-outer quadrant of unspecified female breast: Secondary | ICD-10-CM

## 2012-05-29 DIAGNOSIS — E039 Hypothyroidism, unspecified: Secondary | ICD-10-CM | POA: Diagnosis not present

## 2012-05-29 DIAGNOSIS — I4891 Unspecified atrial fibrillation: Secondary | ICD-10-CM | POA: Diagnosis not present

## 2012-05-29 DIAGNOSIS — N6310 Unspecified lump in the right breast, unspecified quadrant: Secondary | ICD-10-CM

## 2012-05-29 LAB — CBC WITH DIFFERENTIAL/PLATELET
Eosinophils Absolute: 0 10*3/uL (ref 0.0–0.5)
HCT: 30.1 % — ABNORMAL LOW (ref 34.8–46.6)
LYMPH%: 3.6 % — ABNORMAL LOW (ref 14.0–49.7)
MCHC: 34.2 g/dL (ref 31.5–36.0)
MONO#: 0.5 10*3/uL (ref 0.1–0.9)
NEUT#: 14.3 10*3/uL — ABNORMAL HIGH (ref 1.5–6.5)
NEUT%: 93.1 % — ABNORMAL HIGH (ref 38.4–76.8)
Platelets: 500 10*3/uL — ABNORMAL HIGH (ref 145–400)
WBC: 15.4 10*3/uL — ABNORMAL HIGH (ref 3.9–10.3)

## 2012-05-29 IMAGING — CR DG CHEST 1V PORT
1 series · 1 of 1 positions shown · non-contrast
Comparison: May 09, 2012

CLINICAL DATA: Pneumonia, CHF, atelectasis, breast cancer

PORTABLE CHEST - 1 VIEW

[AP]
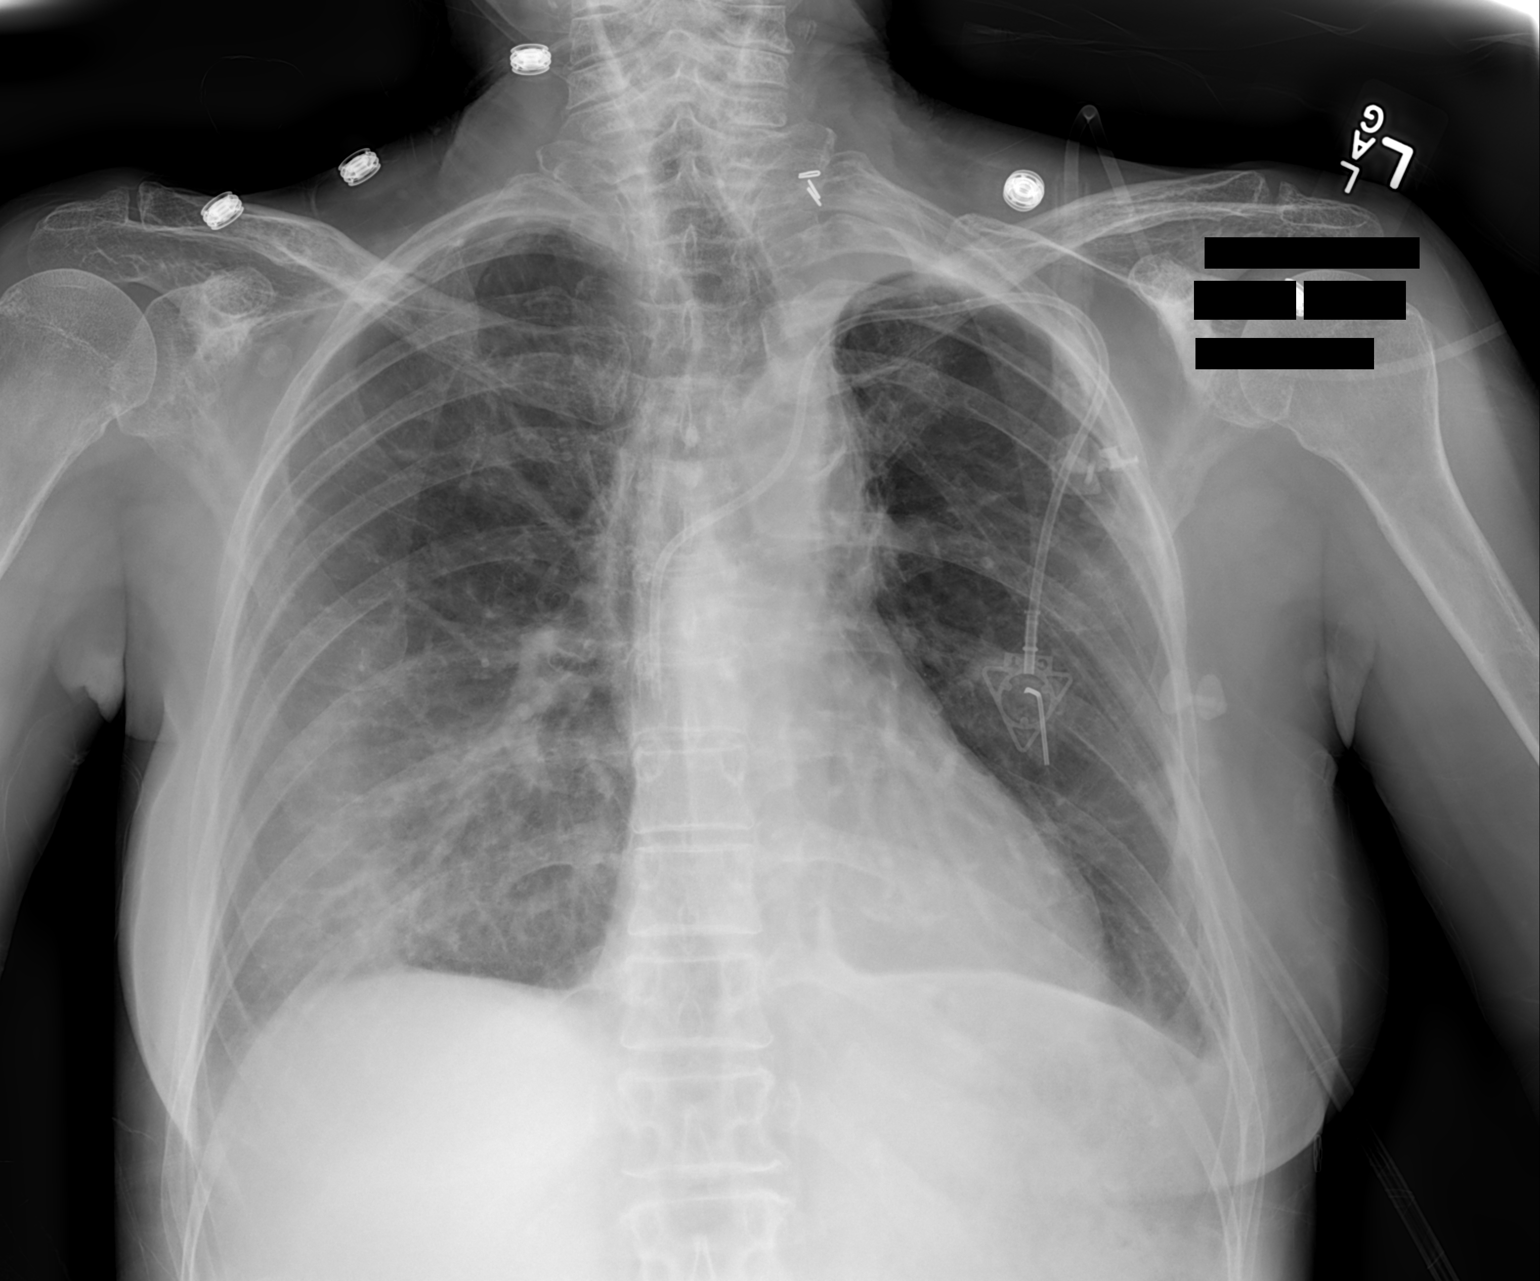

[1 of 1 positions shown; findings below may reference images not displayed]

FINDINGS: The left subclavian Powerport catheter tip is stable at
the cavoatrial junction.  Small pleural effusions have decreased in
size.  There is pulmonary vascular engorgement without frank edema.
Faint alveolar opacity within the right lower lobe remains present.
Left basilar atelectasis has improved.  The cardiac silhouette and
mediastinum are within normal limits.
IMPRESSION: Slight interval improvement in small bilateral effusions and left
basilar atelectasis.

Persistent, faint alveolar opacity in the right base.

## 2012-05-29 MED ORDER — SODIUM CHLORIDE 0.9 % IV SOLN
Freq: Once | INTRAVENOUS | Status: AC
  Administered 2012-05-29: 14:00:00 via INTRAVENOUS

## 2012-05-29 MED ORDER — HEPARIN SOD (PORK) LOCK FLUSH 100 UNIT/ML IV SOLN
500.0000 [IU] | Freq: Once | INTRAVENOUS | Status: AC | PRN
  Administered 2012-05-29: 500 [IU]
  Filled 2012-05-29: qty 5

## 2012-05-29 MED ORDER — DOCETAXEL CHEMO INJECTION 160 MG/16ML
75.0000 mg/m2 | Freq: Once | INTRAVENOUS | Status: AC
  Administered 2012-05-29: 120 mg via INTRAVENOUS
  Filled 2012-05-29: qty 12

## 2012-05-29 MED ORDER — SODIUM CHLORIDE 0.9 % IJ SOLN
10.0000 mL | INTRAMUSCULAR | Status: DC | PRN
  Administered 2012-05-29: 10 mL
  Filled 2012-05-29: qty 10

## 2012-05-29 MED ORDER — SODIUM CHLORIDE 0.9 % IV SOLN
600.0000 mg/m2 | Freq: Once | INTRAVENOUS | Status: AC
  Administered 2012-05-29: 1000 mg via INTRAVENOUS
  Filled 2012-05-29: qty 50

## 2012-05-29 MED ORDER — DEXAMETHASONE SODIUM PHOSPHATE 4 MG/ML IJ SOLN
20.0000 mg | Freq: Once | INTRAMUSCULAR | Status: AC
  Administered 2012-05-29: 20 mg via INTRAVENOUS

## 2012-05-29 MED ORDER — ONDANSETRON 16 MG/50ML IVPB (CHCC)
16.0000 mg | Freq: Once | INTRAVENOUS | Status: AC
  Administered 2012-05-29: 16 mg via INTRAVENOUS

## 2012-05-29 NOTE — Progress Notes (Signed)
Hematology and Oncology Follow Up Visit  [name] [id number] [birth year] 66 y.o. 05/29/2012    HPI: [name] is a 66 year old [place] woman with a history of a T1 B., N0 triple negative right breast carcinoma, tentatively due for day 1 cycle 3/4 planned adjuvant every 3 week Taxotere/Cytoxan on 05/08/2012.  2  Hospitalization for febrile neutropenia, and recent hospitalization for fluid overload following cycle 2.  3. History of atrial fibrillation, LBBB, hypertension.  4. Remote history of Hodgkin's disease.  5. Hypothyroidism.  Interim History:    [name] is seen today in anticipation of day 1 cycle 3/4 planned every 3 week Taxotere/Cytoxan given in the adjuvant setting for her history of a T1b, N0 triple negative right breast carcinoma.  She has occasional nonproductive cough but otherwise feels pretty well. She denies nausea, emesis, diarrhea or constipation. She's had no shortness of breath chest pain or palpitations. A detailed review of systems is otherwise noncontributory as noted below.  Review of Systems: Constitutional:  no weight loss, fever, night sweats, feels well and fatigued Eyes: uses glasses ENT: no complaints Cardiovascular: no chest pain or dyspnea on exertion Respiratory: positive for - cough Neurological: no TIA or stroke symptoms Dermatological: negative Gastrointestinal: no abdominal pain, change in bowel habits, or black or bloody stools Genito-Urinary: no dysuria, trouble voiding, or hematuria Hematological and Lymphatic: negative Breast: negative Musculoskeletal: negative Remaining ROS negative.   Medications:   I have reviewed the patient's current medications.  Current Outpatient Prescriptions  Medication Sig Dispense Refill  . Alum & Mag Hydroxide-Simeth (MAGIC MOUTHWASH W/LIDOCAINE) SOLN Take 15 mLs by mouth 4 (four) times daily as needed.  60 mL  3  . calcium carbonate (OS-CAL) 600 MG TABS Take 600 mg by mouth 2 (two)  times daily with a meal.       . carvedilol (COREG) 12.5 MG tablet Take 18.75 mg by mouth 2 (two) times daily with a meal.      . Cholecalciferol (VITAMIN D3) 3000 UNITS TABS Take 3,000 Units by mouth daily.      Marland Kitchen dexamethasone (DECADRON) 4 MG tablet Take 8 mg by mouth 2 (two) times daily with a meal. Take 2 tablets twice a day before & after chemo days      . furosemide (LASIX) 40 MG tablet Take 1 tablet (40 mg total) by mouth daily as needed (Take as needed for shortness of breath or greater than 3 pound weight gain/24 hours.).  30 tablet  2  . levofloxacin (LEVAQUIN) 500 MG tablet TAKE 1 TABLET EVERY DAY FOR 7 DAYS  7 tablet  0  . levothyroxine (SYNTHROID, LEVOTHROID) 125 MCG tablet Take 125 mcg by mouth daily before breakfast.       . lidocaine-prilocaine (EMLA) cream Apply to port site 1 to 3 hours prior to use  30 g  1  . magnesium oxide (MAG-OX 400) 400 MG tablet Take 400 mg by mouth 2 (two) times daily.       . Multiple Vitamin (MULTIVITAMIN) tablet Take 2 tablets by mouth daily. Natural multivitamin      . naproxen sodium (ANAPROX) 220 MG tablet Take 220 mg by mouth 2 (two) times daily as needed. Pain       . ondansetron (ZOFRAN) 8 MG tablet Take 8 mg by mouth every 8 (eight) hours as needed. For nausea      . OVER THE COUNTER MEDICATION Take 1 tablet by mouth 2 (two) times daily. osteodenx supplement       .  OVER THE COUNTER MEDICATION Take 3 tablets by mouth daily at 12 noon. Vegetarian omega green supplement       . OVER THE COUNTER MEDICATION Take 2 capsules by mouth every morning. mental clarity supplement       . prochlorperazine (COMPAZINE) 10 MG tablet 1 tab po every 6 to 8 hours as needed for breakthrough nausea  30 tablet  0    Allergies:  Allergies  Allergen Reactions  . Benazepril     cough  . Epinephrine     REACTION: BUZZ  . Iohexol      Desc: SNEEZING, pt states this happened in 1988. Has had iv contrast since without being premedicated and has had no problems.  Chest ct 2005- LC 05/09/12     Physical Exam: Filed Vitals:   05/29/12 1337  BP: 162/78  Pulse: 108  Temp: 98.3 F (36.8 C)    Body mass index is 26.37 kg/(m^2). Weight: 144 lbs. HEENT:  Sclerae anicteric, conjunctivae pink.  Oropharynx clear.  No mucositis or candidiasis.   Nodes:  No cervical, supraclavicular, or axillary lymphadenopathy palpated.  Breast Exam: Deferred.   Lungs:  Clear to auscultation bilaterally.  No crackles, rhonchi, or wheezes.   Heart:  Regular rate and rhythm,  slight easily blanchable erythema on the lateral aspect of the port site. Abdomen:  Soft, nontender.  Positive bowel sounds.  No organomegaly or masses palpated.   Musculoskeletal:  No focal spinal tenderness to palpation.  Extremities:  Benign.  No peripheral edema or cyanosis.   Skin:  Benign.   Neuro:  Nonfocal, alert and oriented x 3.   Lab Results: Lab Results  Component Value Date   WBC 15.4* 05/29/2012   HGB 10.3* 05/29/2012   HCT 30.1* 05/29/2012   MCV 90.1 05/29/2012   PLT 500* 05/29/2012   NEUTROABS 14.3* 05/29/2012     Chemistry      Component Value Date/Time   NA 136 05/10/2012 0540   K 4.1 05/10/2012 0540   CL 101 05/10/2012 0540   CO2 28 05/10/2012 0540   BUN 25* 05/10/2012 0540   CREATININE 0.63 05/10/2012 0540      Component Value Date/Time   CALCIUM 8.1* 05/10/2012 0540   ALKPHOS 103 05/07/2012 1444   AST 14 05/07/2012 1444   ALT 14 05/07/2012 1444   BILITOT 0.3 05/07/2012 1444      Lab Results  Component Value Date   LABCA2 32 02/17/2012     Assessment:  [name] is a 66 year old [place] woman with a history of a T1 B., N0 triple negative right breast carcinoma, tentatively due for day 1 cycle 3/4 planned adjuvant every 3 week Taxotere/Cytoxan on 05/08/2012.  2. Recent hospitalization for febrile neutropenia, after failing to present on day 2 for Neulasta following her first cycle.  Second hospitalization after cycle 2 for fluid overload.  3. History of  atrial fibrillation, LBBB, hypertension.  4. Remote history of Hodgkin's disease.  5. Hypothyroidism.  Case reviewed with Dr. Welton Flakes in Dr. Renelda Loma absence.  Plan:  [name] will receive day 1 cycle 3/4 of every three-week Taxotere/Cytoxan today as scheduled.  She will receive Neulasta on day 2.  We will plan on seeing her in one week's time for nadir assessment, she knows to contact us sooner if the need should arise. This plan was reviewed with the patient, who voices understanding and agreement.  She knows to call with any changes or problems.    Daylee Delahoz T, PA-C 05/29/2012

## 2012-05-29 NOTE — Patient Instructions (Addendum)
[facility] Discharge Instructions for Patients Receiving Chemotherapy  Today you received the following chemotherapy agents Taxotere and Cytoxan.  To help prevent nausea and vomiting after your treatment, we encourage you to take your nausea medication Zofran. Begin taking it 05/31/12 before breakfast and take it as often as prescribed for the next 48 hours.   If you develop nausea and vomiting that is not controlled by your nausea medication, call the clinic. If it is after clinic hours your family physician or the after hours number for the clinic or go to the Emergency Department.   BELOW ARE SYMPTOMS THAT SHOULD BE REPORTED IMMEDIATELY:  *FEVER GREATER THAN 100.5 F  *CHILLS WITH OR WITHOUT FEVER  NAUSEA AND VOMITING THAT IS NOT CONTROLLED WITH YOUR NAUSEA MEDICATION  *UNUSUAL SHORTNESS OF BREATH  *UNUSUAL BRUISING OR BLEEDING  TENDERNESS IN MOUTH AND THROAT WITH OR WITHOUT PRESENCE OF ULCERS  *URINARY PROBLEMS  *BOWEL PROBLEMS  UNUSUAL RASH Items with * indicate a potential emergency and should be followed up as soon as possible.  Feel free to call the clinic you have any questions or concerns. The clinic phone number is [phone].   I have been informed and understand all the instructions given to me. I know to contact the clinic, my physician, or go to the Emergency Department if any problems should occur. I do not have any questions at this time, but understand that I may call the clinic during office hours   should I have any questions or need assistance in obtaining follow up care.    __________________________________________  _____________  __________ Signature of Patient or Authorized Representative            Date                   Time    __________________________________________ Nurse's Signature

## 2012-05-30 ENCOUNTER — Ambulatory Visit (HOSPITAL_BASED_OUTPATIENT_CLINIC_OR_DEPARTMENT_OTHER): Payer: Medicare Other

## 2012-05-30 ENCOUNTER — Telehealth: Payer: Self-pay | Admitting: *Deleted

## 2012-05-30 VITALS — BP 128/74 | HR 94 | Temp 97.4°F

## 2012-05-30 DIAGNOSIS — N63 Unspecified lump in unspecified breast: Secondary | ICD-10-CM

## 2012-05-30 DIAGNOSIS — C50419 Malignant neoplasm of upper-outer quadrant of unspecified female breast: Secondary | ICD-10-CM

## 2012-05-30 DIAGNOSIS — N6310 Unspecified lump in the right breast, unspecified quadrant: Secondary | ICD-10-CM

## 2012-05-30 DIAGNOSIS — C50919 Malignant neoplasm of unspecified site of unspecified female breast: Secondary | ICD-10-CM

## 2012-05-30 MED ORDER — PEGFILGRASTIM INJECTION 6 MG/0.6ML
6.0000 mg | Freq: Once | SUBCUTANEOUS | Status: AC
  Administered 2012-05-30: 6 mg via SUBCUTANEOUS
  Filled 2012-05-30: qty 0.6

## 2012-05-30 NOTE — Telephone Encounter (Signed)
Patient called to let us know that she had a fluid build up last night after her taxotere yesterday.  At. 11pm she was a little SOB, increased blood pressure and heart rate.  She took lasix 40mg  and by 2:30 am sx had resolved and she was able to sleep.  She is fine today and has already had her neulasta injection..  Let her know that she did a good job and it is good that she let us know.  Asked her to call us and let us know if this happens again.  Let Dr. Donnie Coffin know what patient experienced and the outcome of her interventions.

## 2012-06-05 ENCOUNTER — Ambulatory Visit: Payer: Medicare Other | Admitting: Physician Assistant

## 2012-06-05 ENCOUNTER — Other Ambulatory Visit: Payer: Medicare Other | Admitting: Lab

## 2012-06-06 ENCOUNTER — Ambulatory Visit (HOSPITAL_BASED_OUTPATIENT_CLINIC_OR_DEPARTMENT_OTHER): Payer: Medicare Other | Admitting: Physician Assistant

## 2012-06-06 ENCOUNTER — Other Ambulatory Visit (HOSPITAL_BASED_OUTPATIENT_CLINIC_OR_DEPARTMENT_OTHER): Payer: Medicare Other

## 2012-06-06 ENCOUNTER — Telehealth: Payer: Self-pay | Admitting: Oncology

## 2012-06-06 VITALS — BP 98/60 | HR 97 | Temp 98.1°F | Ht 62.0 in | Wt 141.7 lb

## 2012-06-06 DIAGNOSIS — C50919 Malignant neoplasm of unspecified site of unspecified female breast: Secondary | ICD-10-CM | POA: Diagnosis not present

## 2012-06-06 DIAGNOSIS — E039 Hypothyroidism, unspecified: Secondary | ICD-10-CM | POA: Diagnosis not present

## 2012-06-06 DIAGNOSIS — I4891 Unspecified atrial fibrillation: Secondary | ICD-10-CM | POA: Diagnosis not present

## 2012-06-06 DIAGNOSIS — D72829 Elevated white blood cell count, unspecified: Secondary | ICD-10-CM | POA: Diagnosis not present

## 2012-06-06 LAB — CBC WITH DIFFERENTIAL/PLATELET
Basophils Absolute: 0.2 10*3/uL — ABNORMAL HIGH (ref 0.0–0.1)
Eosinophils Absolute: 0.1 10*3/uL (ref 0.0–0.5)
HCT: 31.6 % — ABNORMAL LOW (ref 34.8–46.6)
HGB: 10.3 g/dL — ABNORMAL LOW (ref 11.6–15.9)
MCV: 94.9 fL (ref 79.5–101.0)
MONO%: 0.7 % (ref 0.0–14.0)
NEUT#: 63.8 10*3/uL — ABNORMAL HIGH (ref 1.5–6.5)
RDW: 15 % — ABNORMAL HIGH (ref 11.2–14.5)
lymph#: 1.6 10*3/uL (ref 0.9–3.3)

## 2012-06-06 NOTE — Progress Notes (Signed)
Hematology and Oncology Follow Up Visit  [name] [id number] 01-Oct-1946 66 y.o. 06/06/2012    HPI: [name] is a 66 year old [place] woman with a history of a T1 B., N0 triple negative right breast carcinoma, currently day 8cycle 3/4 planned adjuvant every 3 week Taxotere/Cytoxan on 05/08/2012.  2. Hospitalization for febrile neutropenia, and recent hospitalization for fluid overload following cycle 2.  3. History of atrial fibrillation, LBBB, hypertension.  4. Remote history of Hodgkin's disease.  5. Hypothyroidism.  Interim History:   [name] is seen today in follow up after cycle 3/4 planned every 3 week Taxotere/Cytoxan given in the adjuvant setting for her history of a T1b, N0 triple negative right breast carcinoma.  She did receive Neulasta on day 2.  She has occasional nonproductive cough but otherwise feels pretty well. She denies nausea, emesis, diarrhea or constipation. She's had no persistant shortness of breath, except soon after treatment.  It resolved after diuretic use.  She has had no chest pain or palpitations. She denies any neuropathy symptoms. A detailed review of systems is otherwise noncontributory as noted below.  Review of Systems: Constitutional:  no weight loss, fever, night sweats, feels well and fatigued Eyes: uses glasses ENT: no complaints Cardiovascular: no chest pain or dyspnea on exertion Respiratory: positive for - cough Neurological: no TIA or stroke symptoms Dermatological: negative Gastrointestinal: no abdominal pain, change in bowel habits, or black or bloody stools Genito-Urinary: no dysuria, trouble voiding, or hematuria Hematological and Lymphatic: negative Breast: negative Musculoskeletal: negative Remaining ROS negative.   Medications:   I have reviewed the patient's current medications.  Current Outpatient Prescriptions  Medication Sig Dispense Refill  . Alum & Mag Hydroxide-Simeth (MAGIC MOUTHWASH  W/LIDOCAINE) SOLN Take 15 mLs by mouth 4 (four) times daily as needed.  60 mL  3  . calcium carbonate (OS-CAL) 600 MG TABS Take 600 mg by mouth 2 (two) times daily with a meal.       . carvedilol (COREG) 12.5 MG tablet Take 18.75 mg by mouth 2 (two) times daily with a meal.      . Cholecalciferol (VITAMIN D3) 3000 UNITS TABS Take 3,000 Units by mouth daily.      Marland Kitchen dexamethasone (DECADRON) 4 MG tablet Take 8 mg by mouth 2 (two) times daily with a meal. Take 2 tablets twice a day before & after chemo days      . furosemide (LASIX) 40 MG tablet Take 1 tablet (40 mg total) by mouth daily as needed (Take as needed for shortness of breath or greater than 3 pound weight gain/24 hours.).  30 tablet  2  . levofloxacin (LEVAQUIN) 500 MG tablet TAKE 1 TABLET EVERY DAY FOR 7 DAYS  7 tablet  0  . levothyroxine (SYNTHROID, LEVOTHROID) 125 MCG tablet Take 125 mcg by mouth daily before breakfast.       . lidocaine-prilocaine (EMLA) cream Apply to port site 1 to 3 hours prior to use  30 g  1  . magnesium oxide (MAG-OX 400) 400 MG tablet Take 400 mg by mouth 2 (two) times daily.       . Multiple Vitamin (MULTIVITAMIN) tablet Take 2 tablets by mouth daily. Natural multivitamin      . naproxen sodium (ANAPROX) 220 MG tablet Take 220 mg by mouth 2 (two) times daily as needed. Pain       . ondansetron (ZOFRAN) 8 MG tablet Take 8 mg by mouth every 8 (eight) hours as needed. For nausea      .  OVER THE COUNTER MEDICATION Take 1 tablet by mouth 2 (two) times daily. osteodenx supplement       . OVER THE COUNTER MEDICATION Take 3 tablets by mouth daily at 12 noon. Vegetarian omega green supplement       . OVER THE COUNTER MEDICATION Take 2 capsules by mouth every morning. mental clarity supplement       . prochlorperazine (COMPAZINE) 10 MG tablet 1 tab po every 6 to 8 hours as needed for breakthrough nausea  30 tablet  0    Allergies:  Allergies  Allergen Reactions  . Benazepril     cough  . Epinephrine      REACTION: BUZZ  . Iohexol      Desc: SNEEZING, pt states this happened in 1988. Has had iv contrast since without being premedicated and has had no problems. Chest ct 2005- LC 05/09/12     Physical Exam: Filed Vitals:   06/06/12 1325  BP: 98/60  Pulse: 97  Temp: 98.1 F (36.7 C)    Body mass index is 25.92 kg/(m^2). Weight: 141 lbs. HEENT:  Sclerae anicteric, conjunctivae pink.  Oropharynx clear.  No mucositis or candidiasis.   Nodes:  No cervical, supraclavicular, or axillary lymphadenopathy palpated.  Breast Exam: Deferred.   Lungs:  Clear to auscultation bilaterally.  No crackles, rhonchi, or wheezes.   Heart:  Regular rate and rhythm. Abdomen:  Soft, nontender.  Positive bowel sounds.  No organomegaly or masses palpated.   Musculoskeletal:  No focal spinal tenderness to palpation.  Extremities:  Benign.  No peripheral edema or cyanosis.   Skin:  Benign.   Neuro:  Nonfocal, alert and oriented x 3.   Lab Results: Lab Results  Component Value Date   WBC 66.2* 06/06/2012   HGB 10.3* 06/06/2012   HCT 31.6* 06/06/2012   MCV 94.9 06/06/2012   PLT 180 06/06/2012   NEUTROABS 63.8* 06/06/2012     Chemistry      Component Value Date/Time   NA 136 05/10/2012 0540   K 4.1 05/10/2012 0540   CL 101 05/10/2012 0540   CO2 28 05/10/2012 0540   BUN 25* 05/10/2012 0540   CREATININE 0.63 05/10/2012 0540      Component Value Date/Time   CALCIUM 8.1* 05/10/2012 0540   ALKPHOS 103 05/07/2012 1444   AST 14 05/07/2012 1444   ALT 14 05/07/2012 1444   BILITOT 0.3 05/07/2012 1444      Lab Results  Component Value Date   LABCA2 32 02/17/2012     Assessment:  [name] is a 66 year old [place] woman with a history of a T1 B., N0 triple negative right breast carcinoma, currently day 8 cycle 3/4 planned adjuvant every 3 week Taxotere/Cytoxan on 05/08/2012.  2. Hospitalization for febrile neutropenia, after failing to present on day 2 for Neulasta following her first cycle.  Second  hospitalization after cycle 2 for fluid overload.  3. History of atrial fibrillation, LBBB, hypertension.  4. Remote history of Hodgkin's disease.  5. Hypothyroidism.  6. Leukocytosis secondary to Neulasta effect.  Case reviewed with Dr. Donnie Coffin.  Plan:  We will plan on seeing her in two week's time prior to day 1 cycle 4/4 every three week Taxotere/Cytoxan.  We will decide about Neulasta usage depending on her WBC day of treatment.  She knows to contact us sooner if the need should arise. This plan was reviewed with the patient, who voices understanding and agreement.  She knows to call with any changes or  problems.    Deondrae Mcgrail T, PA-C 06/06/2012

## 2012-06-06 NOTE — Telephone Encounter (Signed)
gve the pt her July 2013 appts. Sent michelle a staff message to add the July chemo appt

## 2012-06-19 ENCOUNTER — Other Ambulatory Visit (HOSPITAL_BASED_OUTPATIENT_CLINIC_OR_DEPARTMENT_OTHER): Payer: Medicare Other | Admitting: Lab

## 2012-06-19 ENCOUNTER — Ambulatory Visit (HOSPITAL_BASED_OUTPATIENT_CLINIC_OR_DEPARTMENT_OTHER): Payer: Medicare Other | Admitting: Physician Assistant

## 2012-06-19 ENCOUNTER — Ambulatory Visit (HOSPITAL_BASED_OUTPATIENT_CLINIC_OR_DEPARTMENT_OTHER): Payer: Medicare Other

## 2012-06-19 VITALS — BP 170/83 | HR 99 | Temp 98.4°F | Ht 62.0 in | Wt 144.9 lb

## 2012-06-19 DIAGNOSIS — C50919 Malignant neoplasm of unspecified site of unspecified female breast: Secondary | ICD-10-CM | POA: Diagnosis not present

## 2012-06-19 DIAGNOSIS — C779 Secondary and unspecified malignant neoplasm of lymph node, unspecified: Secondary | ICD-10-CM

## 2012-06-19 DIAGNOSIS — N6310 Unspecified lump in the right breast, unspecified quadrant: Secondary | ICD-10-CM

## 2012-06-19 DIAGNOSIS — C8190 Hodgkin lymphoma, unspecified, unspecified site: Secondary | ICD-10-CM

## 2012-06-19 DIAGNOSIS — Z5111 Encounter for antineoplastic chemotherapy: Secondary | ICD-10-CM

## 2012-06-19 LAB — CBC WITH DIFFERENTIAL/PLATELET
Basophils Absolute: 0 10*3/uL (ref 0.0–0.1)
Eosinophils Absolute: 0 10*3/uL (ref 0.0–0.5)
HCT: 29.9 % — ABNORMAL LOW (ref 34.8–46.6)
LYMPH%: 4.2 % — ABNORMAL LOW (ref 14.0–49.7)
MONO#: 0.9 10*3/uL (ref 0.1–0.9)
NEUT#: 10.7 10*3/uL — ABNORMAL HIGH (ref 1.5–6.5)
NEUT%: 88.1 % — ABNORMAL HIGH (ref 38.4–76.8)
Platelets: 549 10*3/uL — ABNORMAL HIGH (ref 145–400)
WBC: 12.1 10*3/uL — ABNORMAL HIGH (ref 3.9–10.3)

## 2012-06-19 LAB — COMPREHENSIVE METABOLIC PANEL
CO2: 25 mEq/L (ref 19–32)
Creatinine, Ser: 0.68 mg/dL (ref 0.50–1.10)
Glucose, Bld: 208 mg/dL — ABNORMAL HIGH (ref 70–99)
Total Bilirubin: 0.5 mg/dL (ref 0.3–1.2)
Total Protein: 6.3 g/dL (ref 6.0–8.3)

## 2012-06-19 LAB — LACTATE DEHYDROGENASE: LDH: 174 U/L (ref 94–250)

## 2012-06-19 MED ORDER — ONDANSETRON 16 MG/50ML IVPB (CHCC)
16.0000 mg | Freq: Once | INTRAVENOUS | Status: AC
  Administered 2012-06-19: 16 mg via INTRAVENOUS

## 2012-06-19 MED ORDER — SODIUM CHLORIDE 0.9 % IJ SOLN
10.0000 mL | INTRAMUSCULAR | Status: DC | PRN
  Administered 2012-06-19: 10 mL
  Filled 2012-06-19: qty 10

## 2012-06-19 MED ORDER — DEXAMETHASONE SODIUM PHOSPHATE 4 MG/ML IJ SOLN
20.0000 mg | Freq: Once | INTRAMUSCULAR | Status: AC
  Administered 2012-06-19: 20 mg via INTRAVENOUS

## 2012-06-19 MED ORDER — SODIUM CHLORIDE 0.9 % IV SOLN
600.0000 mg/m2 | Freq: Once | INTRAVENOUS | Status: AC
  Administered 2012-06-19: 1000 mg via INTRAVENOUS
  Filled 2012-06-19: qty 50

## 2012-06-19 MED ORDER — SODIUM CHLORIDE 0.9 % IV SOLN
Freq: Once | INTRAVENOUS | Status: AC
  Administered 2012-06-19: 12:00:00 via INTRAVENOUS

## 2012-06-19 MED ORDER — HEPARIN SOD (PORK) LOCK FLUSH 100 UNIT/ML IV SOLN
500.0000 [IU] | Freq: Once | INTRAVENOUS | Status: AC | PRN
  Administered 2012-06-19: 500 [IU]
  Filled 2012-06-19: qty 5

## 2012-06-19 MED ORDER — DOCETAXEL CHEMO INJECTION 160 MG/16ML
75.0000 mg/m2 | Freq: Once | INTRAVENOUS | Status: AC
  Administered 2012-06-19: 120 mg via INTRAVENOUS
  Filled 2012-06-19: qty 12

## 2012-06-19 NOTE — Patient Instructions (Signed)
[facility] Discharge Instructions for Patients Receiving Chemotherapy  Today you received the following chemotherapy agents Taxotere/Cytoxan To help prevent nausea and vomiting after your treatment, we encourage you to take your nausea medication as prescribed.  If you develop nausea and vomiting that is not controlled by your nausea medication, call the clinic. If it is after clinic hours your family physician or the after hours number for the clinic or go to the Emergency Department.   BELOW ARE SYMPTOMS THAT SHOULD BE REPORTED IMMEDIATELY:  *FEVER GREATER THAN 100.5 F  *CHILLS WITH OR WITHOUT FEVER  NAUSEA AND VOMITING THAT IS NOT CONTROLLED WITH YOUR NAUSEA MEDICATION  *UNUSUAL SHORTNESS OF BREATH  *UNUSUAL BRUISING OR BLEEDING  TENDERNESS IN MOUTH AND THROAT WITH OR WITHOUT PRESENCE OF ULCERS  *URINARY PROBLEMS  *BOWEL PROBLEMS  UNUSUAL RASH Items with * indicate a potential emergency and should be followed up as soon as possible.  One of the nurses will contact you 24 hours after your treatment. Please let the nurse know about any problems that you may have experienced. Feel free to call the clinic you have any questions or concerns. The clinic phone number is [phone].   I have been informed and understand all the instructions given to me. I know to contact the clinic, my physician, or go to the Emergency Department if any problems should occur. I do not have any questions at this time, but understand that I may call the clinic during office hours   should I have any questions or need assistance in obtaining follow up care.    __________________________________________  _____________  __________ Signature of Patient or Authorized Representative            Date                   Time    __________________________________________ Nurse's Signature    

## 2012-06-19 NOTE — Progress Notes (Signed)
Hematology and Oncology Follow Up Visit  [name] [id number] [birth year] 66 y.o. 06/19/2012    HPI: Lenea is a 66 year old [place] woman with a history of a T1b, N0 triple negative right breast carcinoma,  Now with biopsy recurrent disease on right chest wall.  Due for day 1 cycle 4/4 planned adjuvant every 3 week Taxotere/Cytoxan.  2  Hospitalization for febrile neutropenia, and recent hospitalization for fluid overload following cycle 2.  3. History of atrial fibrillation, LBBB, hypertension.  4. Remote history of Hodgkin's disease.  5. Hypothyroidism.  Interim History:   Parker is seen today in anticipation of day 1 cycle 4/4 planned every 3 week Taxotere/Cytoxan given in the adjuvant setting recurrent triple negative right breast carcinoma.  She has occasional nonproductive cough but otherwise feels pretty well. She denies nausea, emesis, diarrhea or constipation. She's had no shortness of breath chest pain or palpitations.  She denies and nailbed sensitivity or peripheral neuropathy. A detailed review of systems is otherwise noncontributory as noted below.  Review of Systems: Constitutional:  no weight loss, fever, night sweats, feels well and fatigued Eyes: uses glasses ENT: no complaints Cardiovascular: no chest pain or dyspnea on exertion Respiratory: positive for - cough Neurological: no TIA or stroke symptoms Dermatological: negative Gastrointestinal: no abdominal pain, change in bowel habits, or black or bloody stools Genito-Urinary: no dysuria, trouble voiding, or hematuria Hematological and Lymphatic: negative Breast: negative Musculoskeletal: negative Remaining ROS negative.   Medications:   I have reviewed the patient's current medications.  Current Outpatient Prescriptions  Medication Sig Dispense Refill  . Alum & Mag Hydroxide-Simeth (MAGIC MOUTHWASH W/LIDOCAINE) SOLN Take 15 mLs by mouth 4 (four) times daily as needed.  60 mL  3    . calcium carbonate (OS-CAL) 600 MG TABS Take 600 mg by mouth 2 (two) times daily with a meal.       . carvedilol (COREG) 12.5 MG tablet Take 18.75 mg by mouth 2 (two) times daily with a meal.      . Cholecalciferol (VITAMIN D3) 3000 UNITS TABS Take 3,000 Units by mouth daily.      Marland Kitchen dexamethasone (DECADRON) 4 MG tablet Take 8 mg by mouth 2 (two) times daily with a meal. Take 2 tablets twice a day before & after chemo days      . furosemide (LASIX) 40 MG tablet Take 1 tablet (40 mg total) by mouth daily as needed (Take as needed for shortness of breath or greater than 3 pound weight gain/24 hours.).  30 tablet  2  . levofloxacin (LEVAQUIN) 500 MG tablet TAKE 1 TABLET EVERY DAY FOR 7 DAYS  7 tablet  0  . levothyroxine (SYNTHROID, LEVOTHROID) 125 MCG tablet Take 125 mcg by mouth daily before breakfast.       . lidocaine-prilocaine (EMLA) cream Apply to port site 1 to 3 hours prior to use  30 g  1  . magnesium oxide (MAG-OX 400) 400 MG tablet Take 400 mg by mouth 2 (two) times daily.       . Multiple Vitamin (MULTIVITAMIN) tablet Take 2 tablets by mouth daily. Natural multivitamin      . naproxen sodium (ANAPROX) 220 MG tablet Take 220 mg by mouth 2 (two) times daily as needed. Pain       . ondansetron (ZOFRAN) 8 MG tablet Take 8 mg by mouth every 8 (eight) hours as needed. For nausea      . OVER THE COUNTER MEDICATION Take 1 tablet by mouth  2 (two) times daily. osteodenx supplement       . OVER THE COUNTER MEDICATION Take 3 tablets by mouth daily at 12 noon. Vegetarian omega green supplement       . OVER THE COUNTER MEDICATION Take 2 capsules by mouth every morning. mental clarity supplement       . prochlorperazine (COMPAZINE) 10 MG tablet 1 tab po every 6 to 8 hours as needed for breakthrough nausea  30 tablet  0    Allergies:  Allergies  Allergen Reactions  . Benazepril     cough  . Epinephrine     REACTION: BUZZ  . Iohexol      Desc: SNEEZING, pt states this happened in 1988. Has  had iv contrast since without being premedicated and has had no problems. Chest ct 2005- LC 05/09/12     Physical Exam: Filed Vitals:   06/19/12 1031  BP: 170/83  Pulse: 99  Temp: 98.4 F (36.9 C)    Body mass index is 26.50 kg/(m^2). Weight: 144 lbs. HEENT:  Sclerae anicteric, conjunctivae pink.  Oropharynx clear.  No mucositis or candidiasis.   Nodes:  No cervical, supraclavicular, or axillary lymphadenopathy palpated.  Breast Exam: Deferred.   Lungs:  Clear to auscultation bilaterally.  No crackles, rhonchi, or wheezes.   Heart:  Regular rate and rhythm,  slight easily blanchable erythema on the lateral aspect of the port site. Abdomen:  Soft, nontender.  Positive bowel sounds.  No organomegaly or masses palpated.   Musculoskeletal:  No focal spinal tenderness to palpation.  Extremities:  Benign.  No peripheral edema or cyanosis.   Skin:  Benign.   Neuro:  Nonfocal, alert and oriented x 3.   Lab Results: Lab Results  Component Value Date   WBC 12.1* 06/19/2012   HGB 9.8* 06/19/2012   HCT 29.9* 06/19/2012   MCV 95.9 06/19/2012   PLT 549* 06/19/2012   NEUTROABS 10.7* 06/19/2012     Chemistry      Component Value Date/Time   NA 136 05/10/2012 0540   K 4.1 05/10/2012 0540   CL 101 05/10/2012 0540   CO2 28 05/10/2012 0540   BUN 25* 05/10/2012 0540   CREATININE 0.63 05/10/2012 0540      Component Value Date/Time   CALCIUM 8.1* 05/10/2012 0540   ALKPHOS 103 05/07/2012 1444   AST 14 05/07/2012 1444   ALT 14 05/07/2012 1444   BILITOT 0.3 05/07/2012 1444      Lab Results  Component Value Date   LABCA2 32 02/17/2012     Assessment:  Destany is a 66 year old [place] woman with a history of a T1b, N0 triple negative right breast carcinoma,  for which she the right lumpectomy with sentinel node dissection followed by a right mastectomy in 2009, no adjuvant therapy at that time. No biopsy-proven recurrent chest wall disease. Due for day 1 cycle 4/4 planned adjuvant every 3  week Taxotere/Cytoxan.  2. Recent hospitalization for febrile neutropenia, after failing to present on day 2 for Neulasta following her first cycle.  Second hospitalization after cycle 2 for fluid overload.  3. History of atrial fibrillation, LBBB, hypertension.  4. Remote history of Hodgkin's disease.  5. Hypothyroidism.  Case reviewed with Dr. Donnie Coffin.  Plan:  Jazmene will receive day 1 cycle 4/4 of every three-week Taxotere/Cytoxan today as scheduled.  She will receive Neulasta on day 2.  We will plan on seeing her in one week's time for nadir assessment, she knows to contact us  sooner if the need should arise. This plan was reviewed with the patient, who voices understanding and agreement.  She knows to call with any changes or problems.   Chauntelle Azpeitia T, PA-C 06/19/2012

## 2012-06-20 ENCOUNTER — Ambulatory Visit (HOSPITAL_BASED_OUTPATIENT_CLINIC_OR_DEPARTMENT_OTHER): Payer: Medicare Other

## 2012-06-20 VITALS — BP 140/78 | HR 98 | Temp 97.9°F

## 2012-06-20 DIAGNOSIS — C50419 Malignant neoplasm of upper-outer quadrant of unspecified female breast: Secondary | ICD-10-CM | POA: Diagnosis not present

## 2012-06-20 DIAGNOSIS — C50919 Malignant neoplasm of unspecified site of unspecified female breast: Secondary | ICD-10-CM

## 2012-06-20 DIAGNOSIS — N6310 Unspecified lump in the right breast, unspecified quadrant: Secondary | ICD-10-CM

## 2012-06-20 MED ORDER — PEGFILGRASTIM INJECTION 6 MG/0.6ML
6.0000 mg | Freq: Once | SUBCUTANEOUS | Status: AC
  Administered 2012-06-20: 6 mg via SUBCUTANEOUS
  Filled 2012-06-20: qty 0.6

## 2012-06-26 ENCOUNTER — Telehealth: Payer: Self-pay | Admitting: *Deleted

## 2012-06-26 ENCOUNTER — Other Ambulatory Visit: Payer: Self-pay | Admitting: Oncology

## 2012-06-26 ENCOUNTER — Ambulatory Visit (HOSPITAL_BASED_OUTPATIENT_CLINIC_OR_DEPARTMENT_OTHER): Payer: Medicare Other | Admitting: Physician Assistant

## 2012-06-26 ENCOUNTER — Encounter: Payer: Self-pay | Admitting: Physician Assistant

## 2012-06-26 ENCOUNTER — Other Ambulatory Visit (HOSPITAL_BASED_OUTPATIENT_CLINIC_OR_DEPARTMENT_OTHER): Payer: Medicare Other

## 2012-06-26 VITALS — BP 111/66 | HR 102 | Temp 98.9°F | Ht 62.0 in | Wt 139.9 lb

## 2012-06-26 DIAGNOSIS — C50919 Malignant neoplasm of unspecified site of unspecified female breast: Secondary | ICD-10-CM

## 2012-06-26 DIAGNOSIS — I4891 Unspecified atrial fibrillation: Secondary | ICD-10-CM | POA: Diagnosis not present

## 2012-06-26 DIAGNOSIS — Z171 Estrogen receptor negative status [ER-]: Secondary | ICD-10-CM | POA: Diagnosis not present

## 2012-06-26 DIAGNOSIS — C779 Secondary and unspecified malignant neoplasm of lymph node, unspecified: Secondary | ICD-10-CM | POA: Diagnosis not present

## 2012-06-26 DIAGNOSIS — E559 Vitamin D deficiency, unspecified: Secondary | ICD-10-CM

## 2012-06-26 DIAGNOSIS — Z87898 Personal history of other specified conditions: Secondary | ICD-10-CM

## 2012-06-26 LAB — CBC WITH DIFFERENTIAL/PLATELET
Basophils Absolute: 0.1 10*3/uL (ref 0.0–0.1)
Eosinophils Absolute: 0.1 10*3/uL (ref 0.0–0.5)
HGB: 10.6 g/dL — ABNORMAL LOW (ref 11.6–15.9)
MONO#: 3.5 10*3/uL — ABNORMAL HIGH (ref 0.1–0.9)
MONO%: 19.4 % — ABNORMAL HIGH (ref 0.0–14.0)
NEUT#: 13.1 10*3/uL — ABNORMAL HIGH (ref 1.5–6.5)
RBC: 3.41 10*6/uL — ABNORMAL LOW (ref 3.70–5.45)
RDW: 16.6 % — ABNORMAL HIGH (ref 11.2–14.5)
WBC: 18.1 10*3/uL — ABNORMAL HIGH (ref 3.9–10.3)
lymph#: 1.3 10*3/uL (ref 0.9–3.3)

## 2012-06-26 NOTE — Telephone Encounter (Signed)
Gave patient appointment for 07-11-2012 starting at 12:45pm with labs

## 2012-06-26 NOTE — Telephone Encounter (Signed)
MS. [name], called stating she was to leave Dr. Dayton Scrape a voice message when she had completed her chemotherapy  So she can start her Radiation treatments, asked to be transferred to his voice message on his phone, transferred call and also routed information to hi in basket 3:00 PM

## 2012-06-26 NOTE — Progress Notes (Signed)
Hematology and Oncology Follow Up Visit  [name] [id number] [birth year] 66 y.o. 06/26/2012    HPI: [name] is a 66 year old [place] woman with a history of a T1b, N0 triple negative right breast carcinoma,  Now with biopsy recurrent disease on right chest wall.  Currently day 7 cycle 4/4 planned adjuvant every 3 week Taxotere/Cytoxan.  2  Hospitalization for febrile neutropenia, and recent hospitalization for fluid overload following cycle 2.  3. History of atrial fibrillation, LBBB, hypertension.  4. Remote history of Hodgkin's disease.  5. Hypothyroidism.  Interim History:   [name] is seen today with her husband in aaccompaniment following her fourth of 4 planned cycles of  every 3 week Taxotere/Cytoxan given in the adjuvant setting recurrent triple negative right breast carcinoma.  She is feeling okay, but notes that she did have a couple episodes of "passing out", which actually sound more like falling after vertigo over this past weekend. She has been monitoring her blood pressures closely, and has been hypotensive. The only cardiac meds she is currently on is Coreg. She is staying well hydrated, denying nausea, emesis, diarrhea or constipation. She's had no shortness of breath chest pain or palpitations.  She denies and nailbed sensitivity or peripheral neuropathy. A detailed review of systems is otherwise noncontributory as noted below.  Review of Systems: Constitutional:  no weight loss, fever, night sweats, feels well and fatigued Eyes: uses glasses ENT: no complaints Cardiovascular: no chest pain or dyspnea on exertion Respiratory: positive for - cough Neurological: no TIA or stroke symptoms Dermatological: negative Gastrointestinal: no abdominal pain, change in bowel habits, or black or bloody stools Genito-Urinary: no dysuria, trouble voiding, or hematuria Hematological and Lymphatic: negative Breast: negative Musculoskeletal: negative Remaining  ROS negative.   Medications:   I have reviewed the patient's current medications.  Current Outpatient Prescriptions  Medication Sig Dispense Refill  . Alum & Mag Hydroxide-Simeth (MAGIC MOUTHWASH W/LIDOCAINE) SOLN Take 15 mLs by mouth 4 (four) times daily as needed.  60 mL  3  . calcium carbonate (OS-CAL) 600 MG TABS Take 600 mg by mouth 2 (two) times daily with a meal.       . carvedilol (COREG) 12.5 MG tablet Take 18.75 mg by mouth 2 (two) times daily with a meal.      . Cholecalciferol (VITAMIN D3) 3000 UNITS TABS Take 3,000 Units by mouth daily.      Marland Kitchen dexamethasone (DECADRON) 4 MG tablet Take 8 mg by mouth 2 (two) times daily with a meal. Take 2 tablets twice a day before & after chemo days      . furosemide (LASIX) 40 MG tablet Take 1 tablet (40 mg total) by mouth daily as needed (Take as needed for shortness of breath or greater than 3 pound weight gain/24 hours.).  30 tablet  2  . levofloxacin (LEVAQUIN) 500 MG tablet TAKE 1 TABLET EVERY DAY FOR 7 DAYS  7 tablet  0  . levothyroxine (SYNTHROID, LEVOTHROID) 125 MCG tablet Take 125 mcg by mouth daily before breakfast.       . lidocaine-prilocaine (EMLA) cream Apply to port site 1 to 3 hours prior to use  30 g  1  . magnesium oxide (MAG-OX 400) 400 MG tablet Take 400 mg by mouth 2 (two) times daily.       . Multiple Vitamin (MULTIVITAMIN) tablet Take 2 tablets by mouth daily. Natural multivitamin      . naproxen sodium (ANAPROX) 220 MG tablet Take 220 mg by  mouth 2 (two) times daily as needed. Pain       . ondansetron (ZOFRAN) 8 MG tablet Take 8 mg by mouth every 8 (eight) hours as needed. For nausea      . OVER THE COUNTER MEDICATION Take 1 tablet by mouth 2 (two) times daily. osteodenx supplement       . OVER THE COUNTER MEDICATION Take 3 tablets by mouth daily at 12 noon. Vegetarian omega green supplement       . OVER THE COUNTER MEDICATION Take 2 capsules by mouth every morning. mental clarity supplement       . prochlorperazine  (COMPAZINE) 10 MG tablet 1 tab po every 6 to 8 hours as needed for breakthrough nausea  30 tablet  0    Allergies:  Allergies  Allergen Reactions  . Benazepril     cough  . Epinephrine     REACTION: BUZZ  . Iohexol      Desc: SNEEZING, pt states this happened in 1988. Has had iv contrast since without being premedicated and has had no problems. Chest ct 2005- LC 05/09/12     Physical Exam: Filed Vitals:   06/26/12 1323  BP: 111/66  Pulse: 102  Temp: 98.9 F (37.2 C)    Body mass index is 25.59 kg/(m^2). Weight: 139 lbs. HEENT:  Sclerae anicteric, conjunctivae pink.  Oropharynx clear.  No mucositis or candidiasis.   Nodes:  No cervical, supraclavicular, or axillary lymphadenopathy palpated.  Breast Exam: Deferred.   Lungs:  Clear to auscultation bilaterally.  No crackles, rhonchi, or wheezes.   Heart:  Regular rate and rhythm,  slight easily blanchable erythema on the lateral aspect of the port site. Abdomen:  Soft, nontender.  Positive bowel sounds.  No organomegaly or masses palpated.   Musculoskeletal:  No focal spinal tenderness to palpation.  Extremities:  Benign.  No peripheral edema or cyanosis.   Skin:  Benign.   Neuro:  Nonfocal, alert and oriented x 3.   Lab Results: Lab Results  Component Value Date   WBC 18.1* 06/26/2012   HGB 10.6* 06/26/2012   HCT 32.8* 06/26/2012   MCV 96.2 06/26/2012   PLT 197 06/26/2012   NEUTROABS 13.1* 06/26/2012     Chemistry      Component Value Date/Time   NA 137 06/19/2012 1015   K 4.7 06/19/2012 1015   CL 101 06/19/2012 1015   CO2 25 06/19/2012 1015   BUN 23 06/19/2012 1015   CREATININE 0.68 06/19/2012 1015      Component Value Date/Time   CALCIUM 9.5 06/19/2012 1015   ALKPHOS 89 06/19/2012 1015   AST 14 06/19/2012 1015   ALT 16 06/19/2012 1015   BILITOT 0.5 06/19/2012 1015      Lab Results  Component Value Date   LABCA2 32 02/17/2012     Assessment:  [name] is a 66 year old [place] woman with a history of a T1b, N0 triple  negative right breast carcinoma,  for which she the right lumpectomy with sentinel node dissection followed by a right mastectomy in 2009, no adjuvant therapy at that time. No biopsy-proven recurrent chest wall disease. Currently day 7 cycle 4/4 planned adjuvant every 3 week Taxotere/Cytoxan.  2. Recent hospitalization for febrile neutropenia, after failing to present on day 2 for Neulasta following her first cycle.  Second hospitalization after cycle 2 for fluid overload.  3. History of atrial fibrillation, LBBB, hypertension.  4. Remote history of Hodgkin's disease.  5. Hypothyroidism.  Case reviewed  with Dr. Donnie Coffin.  Plan:  Valeri will return in 2 weeks time for follow up.  She will continue to monitor her blood pressures, and call us if she continues to have hypotension, this said, she was also cautioned to be slow when changing positions.  She knows to contact us sooner than 2 weeks if the need should arise. of note, she has been in contact with Dr. Rennie Plowman office, radiation oncology consultation is pending. This plan was reviewed with the patient, who voices understanding and agreement.  She knows to call with any changes or problems.   Sheridan Gettel T, PA-C 06/26/2012

## 2012-06-27 LAB — COMPREHENSIVE METABOLIC PANEL
Albumin: 3.5 g/dL (ref 3.5–5.2)
Alkaline Phosphatase: 108 U/L (ref 39–117)
BUN: 18 mg/dL (ref 6–23)
Calcium: 8.9 mg/dL (ref 8.4–10.5)
Chloride: 97 mEq/L (ref 96–112)
Glucose, Bld: 158 mg/dL — ABNORMAL HIGH (ref 70–99)
Potassium: 4.7 mEq/L (ref 3.5–5.3)
Sodium: 137 mEq/L (ref 135–145)
Total Protein: 5.5 g/dL — ABNORMAL LOW (ref 6.0–8.3)

## 2012-07-09 ENCOUNTER — Ambulatory Visit
Admission: RE | Admit: 2012-07-09 | Discharge: 2012-07-09 | Disposition: A | Discharge status: Home / Self Care | Payer: Medicare Other | Source: Ambulatory Visit | Attending: Radiation Oncology | Admitting: Radiation Oncology

## 2012-07-09 VITALS — BP 112/66 | HR 88 | Temp 98.6°F | Wt 145.1 lb

## 2012-07-09 DIAGNOSIS — C50919 Malignant neoplasm of unspecified site of unspecified female breast: Secondary | ICD-10-CM | POA: Diagnosis not present

## 2012-07-09 DIAGNOSIS — Z79899 Other long term (current) drug therapy: Secondary | ICD-10-CM | POA: Diagnosis not present

## 2012-07-09 DIAGNOSIS — Z8571 Personal history of Hodgkin lymphoma: Secondary | ICD-10-CM | POA: Diagnosis not present

## 2012-07-09 DIAGNOSIS — Z9010 Acquired absence of unspecified breast and nipple: Secondary | ICD-10-CM | POA: Diagnosis not present

## 2012-07-09 DIAGNOSIS — R059 Cough, unspecified: Secondary | ICD-10-CM | POA: Diagnosis not present

## 2012-07-09 DIAGNOSIS — Z510 Encounter for antineoplastic radiation therapy: Secondary | ICD-10-CM | POA: Diagnosis not present

## 2012-07-09 DIAGNOSIS — R0602 Shortness of breath: Secondary | ICD-10-CM | POA: Diagnosis not present

## 2012-07-09 NOTE — Progress Notes (Signed)
Here for [facility] today.

## 2012-07-09 NOTE — Progress Notes (Signed)
FUNC today.  Currently has SOB and cough since receiving Taxotere. O2 Sat 97%.   Denies any pain.  Reports that she feels her energy level is "30-40%" of what it used to be prior to her 3rd treatment.  Took an extra lasix on yesterday because she felt she had "crackling" in her lungs, but does not feel as if is present today.

## 2012-07-09 NOTE — Progress Notes (Signed)
Simulation/treatment planning note:   The patient was taken to the CT simulator. An alpha cradle was constructed for immobilization. I outlined on her right reconstructed breast her electron beam field.  She was then taken to the [facility] and set up to her right breast marking. I prescribing 5000 cGy in 25 sessions utilizing 6 MEV electrons. 0.8 cm custom bolus to be constructed on her first treatment. A special port plan is requested. One custom block is constructed today to conform the field.

## 2012-07-09 NOTE — Progress Notes (Signed)
Met with patient to discuss RO billing.  Dx: 174.4 Upper-outer quadrant of breast  Attending Rad: Dr. Chinita Greenland Tx: [id number] Extrl Beam

## 2012-07-09 NOTE — Progress Notes (Signed)
Followup note:  Diagnosis: Recurrent triple negative carcinoma the right breast, right chest wall  [name] visits today for review and scheduling of her right chest wall electron beam radiation therapy. To review, she presented with a T1 N0 triple negative right breast cancer rising at approximately 9:00, 67 m from the nipple. She had a mastectomy and implant reconstruction. Dr. Johna Sheriff noted a small nodule at approximately 10:00 in the deep subcutaneous tissue overlying the superior flap over her reconstructed chest wall. An excisional biopsy was found to represent triple negative ductal carcinoma, consistent with her original pathology. Breast MR on 03/07/2012 showed a 4 x 8 x 8 mm nodular enhancement overlying the central breast the patient's right breast prosthesis. Dr. Johna Sheriff went down to the implant and he was able to obtain negative margins. She will onto receive adjuvant chemotherapy and completed 4 cycles of Taxotere/Cytoxan with her last dose on July 2. She still doing well but she does have some shortness of breath and cough which she relates to Taxotere. She remains fatigued and feels that her energy level is only 30-40% of her baseline.  Physical examination: Alert and oriented. No respiratory distress. Vital signs: Wt Readings from Last 3 Encounters:  07/09/12 145 lb 1.6 oz (65.817 kg)  06/26/12 139 lb 14.4 oz (63.458 kg)  06/19/12 144 lb 14.4 oz (65.726 kg)   Temp Readings from Last 3 Encounters:  07/09/12 98.6 F (37 C)   06/26/12 98.9 F (37.2 C) Oral  06/20/12 97.9 F (36.6 C) Oral   BP Readings from Last 3 Encounters:  07/09/12 112/66  06/26/12 111/66  06/20/12 140/78   Pulse Readings from Last 3 Encounters:  07/09/12 88  06/26/12 102  06/20/12 98    O2 sat 97%  Head and neck examination: Remarkable for alopecia. Nodes: Without palpable cervical, supraclavicular, or axillary lymphadenopathy. Chest: Lungs clear. Breasts: Breast implant reconstruction the right.  No visible or palpable evidence for recurrent disease along her chest wall. Left breast without masses or lesions.  Labs:  Lab Results  Component Value Date   WBC 18.1* 06/26/2012   HGB 10.6* 06/26/2012   HCT 32.8* 06/26/2012   MCV 96.2 06/26/2012   PLT 197 06/26/2012    Impression: Recurrent triple negative carcinoma of the right breast, right chest wall. I discussed the potential acute and late toxicities of radiation therapy and consent was signed today. We'll move ahead with simulation/treatment planning today and she'll begin her radiation therapy this week.  15 minutes was spent face-to-face with the patient, primarily counseling the patient.

## 2012-07-10 ENCOUNTER — Ambulatory Visit (INDEPENDENT_AMBULATORY_CARE_PROVIDER_SITE_OTHER): Payer: Medicare Other | Admitting: General Surgery

## 2012-07-10 ENCOUNTER — Ambulatory Visit
Admission: RE | Admit: 2012-07-10 | Discharge: 2012-07-10 | Disposition: A | Discharge status: Home / Self Care | Payer: Medicare Other | Source: Ambulatory Visit | Attending: Radiation Oncology | Admitting: Radiation Oncology

## 2012-07-10 VITALS — BP 136/78 | HR 71 | Temp 98.2°F | Resp 18 | Ht 62.0 in | Wt 142.6 lb

## 2012-07-10 DIAGNOSIS — C50919 Malignant neoplasm of unspecified site of unspecified female breast: Secondary | ICD-10-CM | POA: Diagnosis not present

## 2012-07-10 NOTE — Progress Notes (Signed)
Chief complaint: Followup recurrent cancer right breast  History: Patient returns now 3 months after reexcision of recurrent triple negative invasive cancer of the right breast occurring near her previous mastectomy wound. She is status post total mastectomy and negative sentinel lymph node biopsy for in situ and small area of triple negative invasive carcinoma with original diagnosis of 2009. She did not receive any adjuvant treatment at that time. Following reexcision she has completed her chemotherapy and is now starting radiation. She had some difficulties with some fluid overload during her chemotherapy but this responded to Lasix and at this point she is doing well with just some cough. She has no complaints related to her reconstructed right breast.  Exam: Gen.: Alopecia, no distress Lymph nodes: No cervical, supraclavicular, or axillary nodes palpable Breasts: Well-healed incision upper outer right reconstructed breast which is soft without any thickening or skin nodularity or other abnormalities.  Assessment plan: Doing well with no evidence of early recurrence. She will return in 6 months.

## 2012-07-11 ENCOUNTER — Ambulatory Visit
Admission: RE | Admit: 2012-07-11 | Discharge: 2012-07-11 | Disposition: A | Discharge status: Home / Self Care | Payer: Medicare Other | Source: Ambulatory Visit | Attending: Radiation Oncology | Admitting: Radiation Oncology

## 2012-07-11 ENCOUNTER — Telehealth: Payer: Self-pay | Admitting: *Deleted

## 2012-07-11 ENCOUNTER — Other Ambulatory Visit (HOSPITAL_BASED_OUTPATIENT_CLINIC_OR_DEPARTMENT_OTHER): Payer: Medicare Other | Admitting: Lab

## 2012-07-11 ENCOUNTER — Ambulatory Visit (HOSPITAL_BASED_OUTPATIENT_CLINIC_OR_DEPARTMENT_OTHER): Payer: Medicare Other | Admitting: Physician Assistant

## 2012-07-11 VITALS — BP 114/65 | HR 97 | Temp 98.1°F | Ht 62.0 in | Wt 142.1 lb

## 2012-07-11 DIAGNOSIS — C50919 Malignant neoplasm of unspecified site of unspecified female breast: Secondary | ICD-10-CM | POA: Diagnosis not present

## 2012-07-11 LAB — CBC WITH DIFFERENTIAL/PLATELET
BASO%: 1.9 % (ref 0.0–2.0)
LYMPH%: 7 % — ABNORMAL LOW (ref 14.0–49.7)
MCHC: 33.7 g/dL (ref 31.5–36.0)
MCV: 93.2 fL (ref 79.5–101.0)
MONO#: 1.9 10*3/uL — ABNORMAL HIGH (ref 0.1–0.9)
MONO%: 18.6 % — ABNORMAL HIGH (ref 0.0–14.0)
NEUT#: 7.3 10*3/uL — ABNORMAL HIGH (ref 1.5–6.5)
Platelets: 499 10*3/uL — ABNORMAL HIGH (ref 145–400)
RBC: 3.09 10*6/uL — ABNORMAL LOW (ref 3.70–5.45)
RDW: 17.2 % — ABNORMAL HIGH (ref 11.2–14.5)
WBC: 10.4 10*3/uL — ABNORMAL HIGH (ref 3.9–10.3)
nRBC: 0 % (ref 0–0)

## 2012-07-11 NOTE — Progress Notes (Signed)
Hematology and Oncology Follow Up Visit  [name] [id number] 10-01-1946 66 y.o. 07/11/2012    HPI: [name] is a 66 year old [place] woman with a history of a T1b, N0 triple negative right breast carcinoma,  Now with biopsy recurrent disease on right chest wall.  Completion 4/4 planned cycles of adjuvant every 3 week Taxotere/Cytoxan.  2  Hospitalization for febrile neutropenia, and recent hospitalization for fluid overload following cycle 2.  3. History of atrial fibrillation, LBBB, hypertension.  4. Remote history of Hodgkin's disease.  5. Hypothyroidism.  Interim History:   [name] is seen today unaccompanied after completing 4/4 planned cycles of  every 3 week Taxotere/Cytoxan given in the adjuvant setting recurrent triple negative right breast carcinoma.  She is feeling okay and has started radiation therapy to the left chest wall.  She is staying well hydrated, denying nausea, emesis, diarrhea or constipation. She's had no shortness of breath chest pain or palpitations, but has appreciated a bit of a cough, dry and nonproductive.  She dosed with Lasix this am, and is diuresing well.  She denies and nailbed sensitivity or peripheral neuropathy. A detailed review of systems is otherwise noncontributory as noted below.  Review of Systems: Constitutional:  no weight loss, fever, night sweats, feels well and fatigued Eyes: uses glasses ENT: no complaints Cardiovascular: no chest pain or dyspnea on exertion Respiratory: positive for - cough Neurological: no TIA or stroke symptoms Dermatological: negative Gastrointestinal: no abdominal pain, change in bowel habits, or black or bloody stools Genito-Urinary: no dysuria, trouble voiding, or hematuria Hematological and Lymphatic: negative Breast: negative Musculoskeletal: negative Remaining ROS negative.   Medications:   I have reviewed the patient's current medications.  Current Outpatient Prescriptions    Medication Sig Dispense Refill  . calcium carbonate (OS-CAL) 600 MG TABS Take 600 mg by mouth 2 (two) times daily with a meal.       . carvedilol (COREG) 12.5 MG tablet Take 18.75 mg by mouth 2 (two) times daily with a meal.      . Cholecalciferol (VITAMIN D3) 3000 UNITS TABS Take 3,000 Units by mouth daily.      . furosemide (LASIX) 40 MG tablet Take 1 tablet (40 mg total) by mouth daily as needed (Take as needed for shortness of breath or greater than 3 pound weight gain/24 hours.).  30 tablet  2  . levothyroxine (SYNTHROID, LEVOTHROID) 125 MCG tablet Take 125 mcg by mouth daily before breakfast.       . lidocaine-prilocaine (EMLA) cream Apply to port site 1 to 3 hours prior to use  30 g  1  . magnesium oxide (MAG-OX 400) 400 MG tablet Take 400 mg by mouth 2 (two) times daily.       . Multiple Vitamin (MULTIVITAMIN) tablet Take 2 tablets by mouth daily. Natural multivitamin      . OVER THE COUNTER MEDICATION Take 1 tablet by mouth 2 (two) times daily. osteodenx supplement       . OVER THE COUNTER MEDICATION Take 3 tablets by mouth daily at 12 noon. Vegetarian omega green supplement       . OVER THE COUNTER MEDICATION Take 2 capsules by mouth every morning. mental clarity supplement         Allergies:  Allergies  Allergen Reactions  . Benazepril     cough  . Epinephrine     REACTION: BUZZ  . Iohexol      Desc: SNEEZING, pt states this happened in 1988. Has had  iv contrast since without being premedicated and has had no problems. Chest ct 2005- LC 05/09/12     Physical Exam: Filed Vitals:   07/11/12 1258  BP: 114/65  Pulse: 97  Temp: 98.1 F (36.7 C)    Body mass index is 25.99 kg/(m^2). Weight: 142 lbs. HEENT:  Sclerae anicteric, conjunctivae pink.  Oropharynx clear.  No mucositis or candidiasis.   Nodes:  No cervical, supraclavicular, or axillary lymphadenopathy palpated.  Breast Exam: Deferred.   Lungs:  Clear to auscultation bilaterally.  No crackles, rhonchi, or  wheezes.   Heart:  Regular rate and rhythm. Abdomen:  Soft, nontender.  Positive bowel sounds.  No organomegaly or masses palpated.   Musculoskeletal:  No focal spinal tenderness to palpation.  Extremities:  Benign.  No peripheral edema or cyanosis.   Skin:  Benign.   Neuro:  Nonfocal, alert and oriented x 3.   Lab Results: Lab Results  Component Value Date   WBC 10.4* 07/11/2012   HGB 9.7* 07/11/2012   HCT 28.8* 07/11/2012   MCV 93.2 07/11/2012   PLT 499* 07/11/2012   NEUTROABS 7.3* 07/11/2012     Chemistry      Component Value Date/Time   NA 137 06/26/2012 1314   K 4.7 06/26/2012 1314   CL 97 06/26/2012 1314   CO2 29 06/26/2012 1314   BUN 18 06/26/2012 1314   CREATININE 0.77 06/26/2012 1314      Component Value Date/Time   CALCIUM 8.9 06/26/2012 1314   ALKPHOS 108 06/26/2012 1314   AST 20 06/26/2012 1314   ALT 24 06/26/2012 1314   BILITOT 0.3 06/26/2012 1314      Lab Results  Component Value Date   LABCA2 32 02/17/2012     Assessment:  [name] is a 66 year old [place] woman with a history of a T1b, N0 triple negative right breast carcinoma,  for which she the right lumpectomy with sentinel node dissection followed by a right mastectomy in 2009, no adjuvant therapy at that time. No biopsy-proven recurrent chest wall disease. COmpletion cycle 4/4 planned adjuvant every 3 week Taxotere/Cytoxan, now on active XRT.  2. Recent hospitalization for febrile neutropenia, after failing to present on day 2 for Neulasta following her first cycle.  Second hospitalization after cycle 2 for fluid overload.  3. History of atrial fibrillation, LBBB, hypertension.  4. Remote history of Hodgkin's disease.  5. Hypothyroidism.  Case reviewed with Dr. Donnie Coffin.  Plan:  Danita will return in 2 months time for follow up.   She knows to contact us sooner than 2 weeks if the need should arise. Radiation therapy has been initiated as of 07/10/12.  She will monitor her cough notify us if it does not  improve. This plan was reviewed with the patient, who voices understanding and agreement.  She knows to call with any changes or problems.   Diyari Cherne T, PA-C 07/11/2012

## 2012-07-11 NOTE — Telephone Encounter (Signed)
Gave patient appointment for 09-05-2012 starting at 2:30pm

## 2012-07-12 ENCOUNTER — Ambulatory Visit
Admission: RE | Admit: 2012-07-12 | Discharge: 2012-07-12 | Disposition: A | Discharge status: Home / Self Care | Payer: Medicare Other | Source: Ambulatory Visit | Attending: Radiation Oncology | Admitting: Radiation Oncology

## 2012-07-12 DIAGNOSIS — C50919 Malignant neoplasm of unspecified site of unspecified female breast: Secondary | ICD-10-CM

## 2012-07-12 MED ORDER — ALRA NON-METALLIC DEODORANT (RAD-ONC)
1.0000 "application " | Freq: Once | TOPICAL | Status: AC
  Administered 2012-07-12: 1 via TOPICAL

## 2012-07-12 MED ORDER — RADIAPLEXRX EX GEL
Freq: Once | CUTANEOUS | Status: AC
  Administered 2012-07-12: 12:00:00 via TOPICAL

## 2012-07-12 NOTE — Progress Notes (Addendum)
Post sim ed completed w/pt. Gave pt "Radiation and You" booklet w/all pertinent pages marked. Gave pt Radiaplex lotion, Alra deodorant w/instructions for use. All questions answered. Pt correctly verbalized teachback.

## 2012-07-13 ENCOUNTER — Ambulatory Visit
Admission: RE | Admit: 2012-07-13 | Discharge: 2012-07-13 | Disposition: A | Discharge status: Home / Self Care | Payer: Medicare Other | Source: Ambulatory Visit | Attending: Radiation Oncology | Admitting: Radiation Oncology

## 2012-07-16 ENCOUNTER — Encounter: Payer: Self-pay | Admitting: Radiation Oncology

## 2012-07-16 ENCOUNTER — Ambulatory Visit
Admission: RE | Admit: 2012-07-16 | Discharge: 2012-07-16 | Disposition: A | Discharge status: Home / Self Care | Payer: Medicare Other | Source: Ambulatory Visit | Attending: Radiation Oncology | Admitting: Radiation Oncology

## 2012-07-16 VITALS — BP 146/76 | HR 93 | Resp 18 | Wt 144.3 lb

## 2012-07-16 DIAGNOSIS — C50919 Malignant neoplasm of unspecified site of unspecified female breast: Secondary | ICD-10-CM

## 2012-07-16 NOTE — Progress Notes (Signed)
Patient presents to the clinic tonight following treatment for PUT with Dr. Dayton Scrape. Patient is alert and oriented to person, place, and time. No distress noted. Steady gait noted. Pleasant affect noted. Patient denies pain at this time. Patient denies breast pain. Patient denies skin changes to treatment area but, reports using Radiaplex bid as directed. Patient has no other complaints at this time. Denies nausea, vomiting, headache or dizziness. Reported all findings to Dr. Dayton Scrape.

## 2012-07-16 NOTE — Progress Notes (Signed)
Weekly Management Note:  Site:R breast/Chestwall Current Dose:  1000  cGy Projected Dose: 5000  cGy  Narrative: The patient is seen today for routine under treatment assessment. CBCT/MVCT images/port films were reviewed. The chart was reviewed.   No complaints today. She has Radioplex gel to use when necessary.  Physical Examination:  Filed Vitals:   07/16/12 1909  BP: 146/76  Pulse: 93  Resp: 18  .  Weight: 144 lb 4.8 oz (65.454 kg). No significant skin changes.  Impression: Tolerating radiation therapy well.  Plan: Continue radiation therapy as planned.

## 2012-07-17 ENCOUNTER — Ambulatory Visit
Admission: RE | Admit: 2012-07-17 | Discharge: 2012-07-17 | Disposition: A | Discharge status: Home / Self Care | Payer: Medicare Other | Source: Ambulatory Visit | Attending: Radiation Oncology | Admitting: Radiation Oncology

## 2012-07-17 DIAGNOSIS — C50919 Malignant neoplasm of unspecified site of unspecified female breast: Secondary | ICD-10-CM | POA: Diagnosis not present

## 2012-07-18 ENCOUNTER — Ambulatory Visit: Payer: Medicare Other

## 2012-07-18 ENCOUNTER — Ambulatory Visit
Admission: RE | Admit: 2012-07-18 | Discharge: 2012-07-18 | Disposition: A | Discharge status: Home / Self Care | Payer: Medicare Other | Source: Ambulatory Visit | Attending: Radiation Oncology | Admitting: Radiation Oncology

## 2012-07-18 DIAGNOSIS — Z8571 Personal history of Hodgkin lymphoma: Secondary | ICD-10-CM | POA: Diagnosis not present

## 2012-07-18 DIAGNOSIS — R0602 Shortness of breath: Secondary | ICD-10-CM | POA: Insufficient documentation

## 2012-07-18 DIAGNOSIS — R05 Cough: Secondary | ICD-10-CM | POA: Insufficient documentation

## 2012-07-18 DIAGNOSIS — Z79899 Other long term (current) drug therapy: Secondary | ICD-10-CM | POA: Insufficient documentation

## 2012-07-18 DIAGNOSIS — C50919 Malignant neoplasm of unspecified site of unspecified female breast: Secondary | ICD-10-CM | POA: Insufficient documentation

## 2012-07-18 DIAGNOSIS — Z510 Encounter for antineoplastic radiation therapy: Secondary | ICD-10-CM | POA: Diagnosis not present

## 2012-07-18 DIAGNOSIS — R059 Cough, unspecified: Secondary | ICD-10-CM | POA: Insufficient documentation

## 2012-07-18 DIAGNOSIS — Z9010 Acquired absence of unspecified breast and nipple: Secondary | ICD-10-CM | POA: Diagnosis not present

## 2012-07-19 ENCOUNTER — Ambulatory Visit
Admission: RE | Admit: 2012-07-19 | Discharge: 2012-07-19 | Disposition: A | Discharge status: Home / Self Care | Payer: Medicare Other | Source: Ambulatory Visit | Attending: Radiation Oncology | Admitting: Radiation Oncology

## 2012-07-19 DIAGNOSIS — C50919 Malignant neoplasm of unspecified site of unspecified female breast: Secondary | ICD-10-CM | POA: Diagnosis not present

## 2012-07-19 DIAGNOSIS — Z9010 Acquired absence of unspecified breast and nipple: Secondary | ICD-10-CM | POA: Diagnosis not present

## 2012-07-19 DIAGNOSIS — Z510 Encounter for antineoplastic radiation therapy: Secondary | ICD-10-CM | POA: Diagnosis not present

## 2012-07-19 DIAGNOSIS — R0602 Shortness of breath: Secondary | ICD-10-CM | POA: Diagnosis not present

## 2012-07-19 DIAGNOSIS — R05 Cough: Secondary | ICD-10-CM | POA: Diagnosis not present

## 2012-07-19 DIAGNOSIS — Z8571 Personal history of Hodgkin lymphoma: Secondary | ICD-10-CM | POA: Diagnosis not present

## 2012-07-20 ENCOUNTER — Ambulatory Visit
Admission: RE | Admit: 2012-07-20 | Discharge: 2012-07-20 | Disposition: A | Discharge status: Home / Self Care | Payer: Medicare Other | Source: Ambulatory Visit | Attending: Radiation Oncology | Admitting: Radiation Oncology

## 2012-07-20 DIAGNOSIS — R05 Cough: Secondary | ICD-10-CM | POA: Diagnosis not present

## 2012-07-20 DIAGNOSIS — Z510 Encounter for antineoplastic radiation therapy: Secondary | ICD-10-CM | POA: Diagnosis not present

## 2012-07-20 DIAGNOSIS — Z9010 Acquired absence of unspecified breast and nipple: Secondary | ICD-10-CM | POA: Diagnosis not present

## 2012-07-20 DIAGNOSIS — Z8571 Personal history of Hodgkin lymphoma: Secondary | ICD-10-CM | POA: Diagnosis not present

## 2012-07-20 DIAGNOSIS — C50919 Malignant neoplasm of unspecified site of unspecified female breast: Secondary | ICD-10-CM | POA: Diagnosis not present

## 2012-07-20 DIAGNOSIS — R0602 Shortness of breath: Secondary | ICD-10-CM | POA: Diagnosis not present

## 2012-07-23 ENCOUNTER — Ambulatory Visit
Admission: RE | Admit: 2012-07-23 | Discharge: 2012-07-23 | Disposition: A | Discharge status: Home / Self Care | Payer: Medicare Other | Source: Ambulatory Visit | Attending: Radiation Oncology | Admitting: Radiation Oncology

## 2012-07-23 ENCOUNTER — Encounter: Payer: Self-pay | Admitting: Radiation Oncology

## 2012-07-23 VITALS — BP 148/77 | HR 82 | Resp 18 | Wt 144.0 lb

## 2012-07-23 DIAGNOSIS — R05 Cough: Secondary | ICD-10-CM | POA: Diagnosis not present

## 2012-07-23 DIAGNOSIS — C50919 Malignant neoplasm of unspecified site of unspecified female breast: Secondary | ICD-10-CM | POA: Diagnosis not present

## 2012-07-23 DIAGNOSIS — Z510 Encounter for antineoplastic radiation therapy: Secondary | ICD-10-CM | POA: Diagnosis not present

## 2012-07-23 DIAGNOSIS — R0602 Shortness of breath: Secondary | ICD-10-CM | POA: Diagnosis not present

## 2012-07-23 DIAGNOSIS — Z8571 Personal history of Hodgkin lymphoma: Secondary | ICD-10-CM | POA: Diagnosis not present

## 2012-07-23 DIAGNOSIS — Z9010 Acquired absence of unspecified breast and nipple: Secondary | ICD-10-CM | POA: Diagnosis not present

## 2012-07-23 NOTE — Progress Notes (Signed)
Received patient in the clinic today unaccompanied for PUT with Dr. Dayton Scrape. Patient is alert and oriented to person, place, and time. No distress noted. Steady gait noted. Pleasant affect noted. Patient denies pain at this time. Patient reports her dry cough continues but, has greatly improved. Patient denies SOB but, reports she tires easily. Patient denies a sore throat or esophagitis. Patient reports using Radiaplex on treatment area and denies skin changes. Reported all findings to Dr. Dayton Scrape.

## 2012-07-23 NOTE — Progress Notes (Signed)
Weekly Management Note:  Site:R chestwall Current Dose:  2000  cGy Projected Dose: 5000  cGy  Narrative: The patient is seen today for routine under treatment assessment. CBCT/MVCT images/port films were reviewed. The chart was reviewed.   No complaints today. She has Radioplex gel to use when necessary.  Physical Examination:  Filed Vitals:   07/23/12 1246  BP: 148/77  Pulse: 82  Resp: 18  .  Weight: 144 lb (65.318 kg). Faint erythema within the treatment field.  Impression: Tolerating radiation therapy well.  Plan: Continue radiation therapy as planned.

## 2012-07-24 ENCOUNTER — Ambulatory Visit
Admission: RE | Admit: 2012-07-24 | Discharge: 2012-07-24 | Disposition: A | Discharge status: Home / Self Care | Payer: Medicare Other | Source: Ambulatory Visit | Attending: Radiation Oncology | Admitting: Radiation Oncology

## 2012-07-24 DIAGNOSIS — R0602 Shortness of breath: Secondary | ICD-10-CM | POA: Diagnosis not present

## 2012-07-24 DIAGNOSIS — R05 Cough: Secondary | ICD-10-CM | POA: Diagnosis not present

## 2012-07-24 DIAGNOSIS — C50919 Malignant neoplasm of unspecified site of unspecified female breast: Secondary | ICD-10-CM | POA: Diagnosis not present

## 2012-07-24 DIAGNOSIS — Z510 Encounter for antineoplastic radiation therapy: Secondary | ICD-10-CM | POA: Diagnosis not present

## 2012-07-24 DIAGNOSIS — Z8571 Personal history of Hodgkin lymphoma: Secondary | ICD-10-CM | POA: Diagnosis not present

## 2012-07-24 DIAGNOSIS — Z9010 Acquired absence of unspecified breast and nipple: Secondary | ICD-10-CM | POA: Diagnosis not present

## 2012-07-25 ENCOUNTER — Ambulatory Visit
Admission: RE | Admit: 2012-07-25 | Discharge: 2012-07-25 | Disposition: A | Discharge status: Home / Self Care | Payer: Medicare Other | Source: Ambulatory Visit | Attending: Radiation Oncology | Admitting: Radiation Oncology

## 2012-07-25 DIAGNOSIS — Z8571 Personal history of Hodgkin lymphoma: Secondary | ICD-10-CM | POA: Diagnosis not present

## 2012-07-25 DIAGNOSIS — R0602 Shortness of breath: Secondary | ICD-10-CM | POA: Diagnosis not present

## 2012-07-25 DIAGNOSIS — Z9010 Acquired absence of unspecified breast and nipple: Secondary | ICD-10-CM | POA: Diagnosis not present

## 2012-07-25 DIAGNOSIS — Z510 Encounter for antineoplastic radiation therapy: Secondary | ICD-10-CM | POA: Diagnosis not present

## 2012-07-25 DIAGNOSIS — C50919 Malignant neoplasm of unspecified site of unspecified female breast: Secondary | ICD-10-CM | POA: Diagnosis not present

## 2012-07-25 DIAGNOSIS — R05 Cough: Secondary | ICD-10-CM | POA: Diagnosis not present

## 2012-07-26 ENCOUNTER — Ambulatory Visit
Admission: RE | Admit: 2012-07-26 | Discharge: 2012-07-26 | Disposition: A | Discharge status: Home / Self Care | Payer: Medicare Other | Source: Ambulatory Visit | Attending: Radiation Oncology | Admitting: Radiation Oncology

## 2012-07-26 ENCOUNTER — Other Ambulatory Visit: Payer: Self-pay | Admitting: *Deleted

## 2012-07-26 DIAGNOSIS — R05 Cough: Secondary | ICD-10-CM | POA: Diagnosis not present

## 2012-07-26 DIAGNOSIS — Z510 Encounter for antineoplastic radiation therapy: Secondary | ICD-10-CM | POA: Diagnosis not present

## 2012-07-26 DIAGNOSIS — Z9010 Acquired absence of unspecified breast and nipple: Secondary | ICD-10-CM | POA: Diagnosis not present

## 2012-07-26 DIAGNOSIS — R0602 Shortness of breath: Secondary | ICD-10-CM | POA: Diagnosis not present

## 2012-07-26 DIAGNOSIS — C50919 Malignant neoplasm of unspecified site of unspecified female breast: Secondary | ICD-10-CM | POA: Diagnosis not present

## 2012-07-26 DIAGNOSIS — Z8571 Personal history of Hodgkin lymphoma: Secondary | ICD-10-CM | POA: Diagnosis not present

## 2012-07-26 DIAGNOSIS — I6529 Occlusion and stenosis of unspecified carotid artery: Secondary | ICD-10-CM

## 2012-07-27 ENCOUNTER — Ambulatory Visit
Admission: RE | Admit: 2012-07-27 | Discharge: 2012-07-27 | Disposition: A | Discharge status: Home / Self Care | Payer: Medicare Other | Source: Ambulatory Visit | Attending: Radiation Oncology | Admitting: Radiation Oncology

## 2012-07-27 DIAGNOSIS — C50919 Malignant neoplasm of unspecified site of unspecified female breast: Secondary | ICD-10-CM | POA: Diagnosis not present

## 2012-07-27 DIAGNOSIS — Z9010 Acquired absence of unspecified breast and nipple: Secondary | ICD-10-CM | POA: Diagnosis not present

## 2012-07-27 DIAGNOSIS — Z510 Encounter for antineoplastic radiation therapy: Secondary | ICD-10-CM | POA: Diagnosis not present

## 2012-07-27 DIAGNOSIS — R05 Cough: Secondary | ICD-10-CM | POA: Diagnosis not present

## 2012-07-27 DIAGNOSIS — Z8571 Personal history of Hodgkin lymphoma: Secondary | ICD-10-CM | POA: Diagnosis not present

## 2012-07-27 DIAGNOSIS — R0602 Shortness of breath: Secondary | ICD-10-CM | POA: Diagnosis not present

## 2012-07-30 ENCOUNTER — Encounter: Payer: Self-pay | Admitting: Radiation Oncology

## 2012-07-30 ENCOUNTER — Ambulatory Visit
Admission: RE | Admit: 2012-07-30 | Discharge: 2012-07-30 | Disposition: A | Discharge status: Home / Self Care | Payer: Medicare Other | Source: Ambulatory Visit | Attending: Radiation Oncology | Admitting: Radiation Oncology

## 2012-07-30 VITALS — BP 113/65 | HR 84 | Temp 98.7°F | Resp 20 | Wt 143.1 lb

## 2012-07-30 DIAGNOSIS — R0602 Shortness of breath: Secondary | ICD-10-CM | POA: Diagnosis not present

## 2012-07-30 DIAGNOSIS — C50919 Malignant neoplasm of unspecified site of unspecified female breast: Secondary | ICD-10-CM | POA: Diagnosis not present

## 2012-07-30 DIAGNOSIS — Z9010 Acquired absence of unspecified breast and nipple: Secondary | ICD-10-CM | POA: Diagnosis not present

## 2012-07-30 DIAGNOSIS — R05 Cough: Secondary | ICD-10-CM | POA: Diagnosis not present

## 2012-07-30 DIAGNOSIS — Z8571 Personal history of Hodgkin lymphoma: Secondary | ICD-10-CM | POA: Diagnosis not present

## 2012-07-30 DIAGNOSIS — Z510 Encounter for antineoplastic radiation therapy: Secondary | ICD-10-CM | POA: Diagnosis not present

## 2012-07-30 DIAGNOSIS — C7989 Secondary malignant neoplasm of other specified sites: Secondary | ICD-10-CM

## 2012-07-30 NOTE — Progress Notes (Signed)
   Weekly Management Note Current Dose:   30Gy  Projected Dose:  50Gy   Narrative:  The patient presents for routine under treatment assessment.  CBCT/MVCT images/Port film x-rays were reviewed.  The chart was checked. She has some fatigue, but no loss of appetite, and she denies pain. Applying Radiaplex to tx area on right chest wall once a day.  Physical Findings:  weight is 143 lb 1.6 oz (64.91 kg). Her oral temperature is 98.7 F (37.1 C). Her blood pressure is 113/65 and her pulse is 84. Her respiration is 20.  minor erythema over the right chest wall mastectomy scar  Impression:  The patient is tolerating radiotherapy.  Plan:  Continue radiotherapy as planned. Recommended applying Radiaplex twice a day.  ________________________________   Lonie Peak, M.D.

## 2012-07-30 NOTE — Progress Notes (Signed)
Pt has some fatigue, no loss of appetite, denies pain. Applying Radiaplex to tx area on right chest wall.

## 2012-07-31 ENCOUNTER — Encounter (INDEPENDENT_AMBULATORY_CARE_PROVIDER_SITE_OTHER): Payer: Medicare Other

## 2012-07-31 ENCOUNTER — Ambulatory Visit
Admission: RE | Admit: 2012-07-31 | Discharge: 2012-07-31 | Disposition: A | Discharge status: Home / Self Care | Payer: Medicare Other | Source: Ambulatory Visit | Attending: Radiation Oncology | Admitting: Radiation Oncology

## 2012-07-31 DIAGNOSIS — C50919 Malignant neoplasm of unspecified site of unspecified female breast: Secondary | ICD-10-CM | POA: Diagnosis not present

## 2012-07-31 DIAGNOSIS — Z510 Encounter for antineoplastic radiation therapy: Secondary | ICD-10-CM | POA: Diagnosis not present

## 2012-07-31 DIAGNOSIS — I6529 Occlusion and stenosis of unspecified carotid artery: Secondary | ICD-10-CM

## 2012-07-31 DIAGNOSIS — R0602 Shortness of breath: Secondary | ICD-10-CM | POA: Diagnosis not present

## 2012-07-31 DIAGNOSIS — Z9010 Acquired absence of unspecified breast and nipple: Secondary | ICD-10-CM | POA: Diagnosis not present

## 2012-07-31 DIAGNOSIS — Z8571 Personal history of Hodgkin lymphoma: Secondary | ICD-10-CM | POA: Diagnosis not present

## 2012-07-31 DIAGNOSIS — R05 Cough: Secondary | ICD-10-CM | POA: Diagnosis not present

## 2012-08-01 ENCOUNTER — Ambulatory Visit
Admission: RE | Admit: 2012-08-01 | Discharge: 2012-08-01 | Disposition: A | Discharge status: Home / Self Care | Payer: Medicare Other | Source: Ambulatory Visit | Attending: Radiation Oncology | Admitting: Radiation Oncology

## 2012-08-01 DIAGNOSIS — R05 Cough: Secondary | ICD-10-CM | POA: Diagnosis not present

## 2012-08-01 DIAGNOSIS — Z9010 Acquired absence of unspecified breast and nipple: Secondary | ICD-10-CM | POA: Diagnosis not present

## 2012-08-01 DIAGNOSIS — R0602 Shortness of breath: Secondary | ICD-10-CM | POA: Diagnosis not present

## 2012-08-01 DIAGNOSIS — Z8571 Personal history of Hodgkin lymphoma: Secondary | ICD-10-CM | POA: Diagnosis not present

## 2012-08-01 DIAGNOSIS — C50919 Malignant neoplasm of unspecified site of unspecified female breast: Secondary | ICD-10-CM | POA: Diagnosis not present

## 2012-08-01 DIAGNOSIS — Z510 Encounter for antineoplastic radiation therapy: Secondary | ICD-10-CM | POA: Diagnosis not present

## 2012-08-02 ENCOUNTER — Ambulatory Visit
Admission: RE | Admit: 2012-08-02 | Discharge: 2012-08-02 | Disposition: A | Discharge status: Home / Self Care | Payer: Medicare Other | Source: Ambulatory Visit | Attending: Radiation Oncology | Admitting: Radiation Oncology

## 2012-08-02 DIAGNOSIS — R0602 Shortness of breath: Secondary | ICD-10-CM | POA: Diagnosis not present

## 2012-08-02 DIAGNOSIS — C50919 Malignant neoplasm of unspecified site of unspecified female breast: Secondary | ICD-10-CM | POA: Diagnosis not present

## 2012-08-02 DIAGNOSIS — Z8571 Personal history of Hodgkin lymphoma: Secondary | ICD-10-CM | POA: Diagnosis not present

## 2012-08-02 DIAGNOSIS — Z9010 Acquired absence of unspecified breast and nipple: Secondary | ICD-10-CM | POA: Diagnosis not present

## 2012-08-02 DIAGNOSIS — Z510 Encounter for antineoplastic radiation therapy: Secondary | ICD-10-CM | POA: Diagnosis not present

## 2012-08-02 DIAGNOSIS — R05 Cough: Secondary | ICD-10-CM | POA: Diagnosis not present

## 2012-08-03 ENCOUNTER — Ambulatory Visit
Admission: RE | Admit: 2012-08-03 | Discharge: 2012-08-03 | Disposition: A | Discharge status: Home / Self Care | Payer: Medicare Other | Source: Ambulatory Visit | Attending: Radiation Oncology | Admitting: Radiation Oncology

## 2012-08-03 DIAGNOSIS — C50919 Malignant neoplasm of unspecified site of unspecified female breast: Secondary | ICD-10-CM | POA: Diagnosis not present

## 2012-08-03 DIAGNOSIS — Z8571 Personal history of Hodgkin lymphoma: Secondary | ICD-10-CM | POA: Diagnosis not present

## 2012-08-03 DIAGNOSIS — R05 Cough: Secondary | ICD-10-CM | POA: Diagnosis not present

## 2012-08-03 DIAGNOSIS — Z510 Encounter for antineoplastic radiation therapy: Secondary | ICD-10-CM | POA: Diagnosis not present

## 2012-08-03 DIAGNOSIS — R0602 Shortness of breath: Secondary | ICD-10-CM | POA: Diagnosis not present

## 2012-08-03 DIAGNOSIS — Z9010 Acquired absence of unspecified breast and nipple: Secondary | ICD-10-CM | POA: Diagnosis not present

## 2012-08-06 ENCOUNTER — Ambulatory Visit
Admission: RE | Admit: 2012-08-06 | Discharge: 2012-08-06 | Disposition: A | Discharge status: Home / Self Care | Payer: Medicare Other | Source: Ambulatory Visit | Attending: Radiation Oncology | Admitting: Radiation Oncology

## 2012-08-06 ENCOUNTER — Encounter: Payer: Self-pay | Admitting: Radiation Oncology

## 2012-08-06 VITALS — BP 124/66 | HR 79 | Temp 97.5°F | Resp 20 | Wt 145.4 lb

## 2012-08-06 DIAGNOSIS — R0602 Shortness of breath: Secondary | ICD-10-CM | POA: Diagnosis not present

## 2012-08-06 DIAGNOSIS — R05 Cough: Secondary | ICD-10-CM | POA: Diagnosis not present

## 2012-08-06 DIAGNOSIS — Z8571 Personal history of Hodgkin lymphoma: Secondary | ICD-10-CM | POA: Diagnosis not present

## 2012-08-06 DIAGNOSIS — C50919 Malignant neoplasm of unspecified site of unspecified female breast: Secondary | ICD-10-CM

## 2012-08-06 DIAGNOSIS — Z9010 Acquired absence of unspecified breast and nipple: Secondary | ICD-10-CM | POA: Diagnosis not present

## 2012-08-06 DIAGNOSIS — Z510 Encounter for antineoplastic radiation therapy: Secondary | ICD-10-CM | POA: Diagnosis not present

## 2012-08-06 NOTE — Progress Notes (Signed)
Weekly Management Note:  Site:R chestwall/breast Current Dose:  4000  cGy Projected Dose: 5000  cGy  Narrative: The patient is seen today for routine under treatment assessment. CBCT/MVCT images/port films were reviewed. The chart was reviewed.   She uses Radioplex gel. No complaints today.  Physical Examination:  Filed Vitals:   08/06/12 1035  BP: 124/66  Pulse: 79  Temp: 97.5 F (36.4 C)  Resp: 20  .  Weight: 145 lb 6.4 oz (65.953 kg). There is slight erythema with hyperpigmentation the skin with no areas of desquamation.  Impression: Tolerating radiation therapy well.  Plan: Continue radiation therapy as planned.

## 2012-08-06 NOTE — Progress Notes (Signed)
Pt denies pain, fatigue, loss of appetite. Applying Radiaplex to right breast where she has slight pinkness.

## 2012-08-07 ENCOUNTER — Ambulatory Visit
Admission: RE | Admit: 2012-08-07 | Discharge: 2012-08-07 | Disposition: A | Discharge status: Home / Self Care | Payer: Medicare Other | Source: Ambulatory Visit | Attending: Radiation Oncology | Admitting: Radiation Oncology

## 2012-08-07 DIAGNOSIS — Z510 Encounter for antineoplastic radiation therapy: Secondary | ICD-10-CM | POA: Diagnosis not present

## 2012-08-07 DIAGNOSIS — Z9010 Acquired absence of unspecified breast and nipple: Secondary | ICD-10-CM | POA: Diagnosis not present

## 2012-08-07 DIAGNOSIS — C50919 Malignant neoplasm of unspecified site of unspecified female breast: Secondary | ICD-10-CM | POA: Diagnosis not present

## 2012-08-07 DIAGNOSIS — R0602 Shortness of breath: Secondary | ICD-10-CM | POA: Diagnosis not present

## 2012-08-07 DIAGNOSIS — Z8571 Personal history of Hodgkin lymphoma: Secondary | ICD-10-CM | POA: Diagnosis not present

## 2012-08-07 DIAGNOSIS — R05 Cough: Secondary | ICD-10-CM | POA: Diagnosis not present

## 2012-08-08 ENCOUNTER — Ambulatory Visit
Admission: RE | Admit: 2012-08-08 | Discharge: 2012-08-08 | Disposition: A | Discharge status: Home / Self Care | Payer: Medicare Other | Source: Ambulatory Visit | Attending: Radiation Oncology | Admitting: Radiation Oncology

## 2012-08-08 DIAGNOSIS — Z8571 Personal history of Hodgkin lymphoma: Secondary | ICD-10-CM | POA: Diagnosis not present

## 2012-08-08 DIAGNOSIS — R0602 Shortness of breath: Secondary | ICD-10-CM | POA: Diagnosis not present

## 2012-08-08 DIAGNOSIS — C50919 Malignant neoplasm of unspecified site of unspecified female breast: Secondary | ICD-10-CM | POA: Diagnosis not present

## 2012-08-08 DIAGNOSIS — R05 Cough: Secondary | ICD-10-CM | POA: Diagnosis not present

## 2012-08-08 DIAGNOSIS — Z9010 Acquired absence of unspecified breast and nipple: Secondary | ICD-10-CM | POA: Diagnosis not present

## 2012-08-08 DIAGNOSIS — Z510 Encounter for antineoplastic radiation therapy: Secondary | ICD-10-CM | POA: Diagnosis not present

## 2012-08-09 ENCOUNTER — Ambulatory Visit
Admission: RE | Admit: 2012-08-09 | Discharge: 2012-08-09 | Disposition: A | Discharge status: Home / Self Care | Payer: Medicare Other | Source: Ambulatory Visit | Attending: Radiation Oncology | Admitting: Radiation Oncology

## 2012-08-09 DIAGNOSIS — Z8571 Personal history of Hodgkin lymphoma: Secondary | ICD-10-CM | POA: Diagnosis not present

## 2012-08-09 DIAGNOSIS — C50919 Malignant neoplasm of unspecified site of unspecified female breast: Secondary | ICD-10-CM | POA: Diagnosis not present

## 2012-08-09 DIAGNOSIS — R05 Cough: Secondary | ICD-10-CM | POA: Diagnosis not present

## 2012-08-09 DIAGNOSIS — Z9010 Acquired absence of unspecified breast and nipple: Secondary | ICD-10-CM | POA: Diagnosis not present

## 2012-08-09 DIAGNOSIS — R0602 Shortness of breath: Secondary | ICD-10-CM | POA: Diagnosis not present

## 2012-08-09 DIAGNOSIS — Z510 Encounter for antineoplastic radiation therapy: Secondary | ICD-10-CM | POA: Diagnosis not present

## 2012-08-10 ENCOUNTER — Ambulatory Visit
Admission: RE | Admit: 2012-08-10 | Discharge: 2012-08-10 | Disposition: A | Discharge status: Home / Self Care | Payer: Medicare Other | Source: Ambulatory Visit | Attending: Radiation Oncology | Admitting: Radiation Oncology

## 2012-08-10 DIAGNOSIS — R05 Cough: Secondary | ICD-10-CM | POA: Diagnosis not present

## 2012-08-10 DIAGNOSIS — Z510 Encounter for antineoplastic radiation therapy: Secondary | ICD-10-CM | POA: Diagnosis not present

## 2012-08-10 DIAGNOSIS — R0602 Shortness of breath: Secondary | ICD-10-CM | POA: Diagnosis not present

## 2012-08-10 DIAGNOSIS — Z8571 Personal history of Hodgkin lymphoma: Secondary | ICD-10-CM | POA: Diagnosis not present

## 2012-08-10 DIAGNOSIS — C50919 Malignant neoplasm of unspecified site of unspecified female breast: Secondary | ICD-10-CM | POA: Diagnosis not present

## 2012-08-10 DIAGNOSIS — Z9010 Acquired absence of unspecified breast and nipple: Secondary | ICD-10-CM | POA: Diagnosis not present

## 2012-08-13 ENCOUNTER — Ambulatory Visit
Admission: RE | Admit: 2012-08-13 | Discharge: 2012-08-13 | Disposition: A | Discharge status: Home / Self Care | Payer: Medicare Other | Source: Ambulatory Visit | Attending: Radiation Oncology | Admitting: Radiation Oncology

## 2012-08-13 ENCOUNTER — Encounter: Payer: Self-pay | Admitting: Radiation Oncology

## 2012-08-13 VITALS — BP 131/75 | HR 74 | Temp 98.6°F | Resp 20 | Wt 142.9 lb

## 2012-08-13 DIAGNOSIS — R05 Cough: Secondary | ICD-10-CM | POA: Diagnosis not present

## 2012-08-13 DIAGNOSIS — Z510 Encounter for antineoplastic radiation therapy: Secondary | ICD-10-CM | POA: Diagnosis not present

## 2012-08-13 DIAGNOSIS — Z8571 Personal history of Hodgkin lymphoma: Secondary | ICD-10-CM | POA: Diagnosis not present

## 2012-08-13 DIAGNOSIS — Z9010 Acquired absence of unspecified breast and nipple: Secondary | ICD-10-CM | POA: Diagnosis not present

## 2012-08-13 DIAGNOSIS — C50919 Malignant neoplasm of unspecified site of unspecified female breast: Secondary | ICD-10-CM

## 2012-08-13 DIAGNOSIS — R0602 Shortness of breath: Secondary | ICD-10-CM | POA: Diagnosis not present

## 2012-08-13 NOTE — Progress Notes (Signed)
Weekly Management Note:  Site:R Chestwall Current Dose:  5000  cGy Projected Dose: 5000  cGy  Narrative: The patient is seen today for routine under treatment assessment. CBCT/MVCT images/port films were reviewed. The chart was reviewed.   No complaints today. She uses Radioplex gel.  Physical Examination:  Filed Vitals:   08/13/12 1037  BP: 131/75  Pulse: 74  Temp: 98.6 F (37 C)  Resp: 20  .  Weight: 142 lb 14.4 oz (64.819 kg). There is erythema along the right chest wall/breast/tumor bed with patchy dry desquamation. No moist desquamation.  Impression: Radiation therapy well tolerated.  Plan: Followup visit here one month.

## 2012-08-13 NOTE — Progress Notes (Signed)
Patient,alert,oriented x3,  Today completed  25/25 rad txs right breast area, erythema, skin intact, using radiaplex gel bid, will continue  Using next couple weeks, then can switch to a lotion with vitamin e, and gave [facility] card to patient,  Starts sept 23, 2013, patient is interested, no c/o pain , energy level better than it was 2 weeks ago, walks up some hills now with the dog 10:36 AM

## 2012-08-13 NOTE — Progress Notes (Signed)
[facility] Radiation Oncology End of Treatment Note  Name:[name]  Date: 08/13/2012 RUE:[id number] DOB:12-16-1946   Status:outpatient    CC: Thora Lance, MD  , Dr. Jaclynn Guarneri  REFERRING PHYSICIAN: Dr. Jaclynn Guarneri   DIAGNOSIS: Recurrent triple negative carcinoma the right breast, right chest wall   INDICATION FOR TREATMENT: Curative   TREATMENT DATES: 07/10/2012 through 08/13/2012                           SITE/DOSE: Right chest wall recurrence tumor bed                           BEAMS/ENERGY:   6 MEV electrons with 0.5 cm custom bolus to maximize the dose to the skin surface               NARRATIVE: The patient tolerated treatment well with marked erythema and patchy dry desquamation of the skin by completion of therapy. No areas of moist desquamation. She used Radioplex gel during her course of therapy.                           PLAN: Routine followup in one month. Patient instructed to call if questions or worsening complaints in interim.

## 2012-08-16 ENCOUNTER — Telehealth: Payer: Self-pay | Admitting: *Deleted

## 2012-08-16 NOTE — Telephone Encounter (Signed)
PT AWARE OF CAROTID RESULTS REPEAT IN 6 MO LICA 60-79% STABLE .[name]

## 2012-08-21 DIAGNOSIS — M25519 Pain in unspecified shoulder: Secondary | ICD-10-CM | POA: Diagnosis not present

## 2012-08-28 DIAGNOSIS — M25519 Pain in unspecified shoulder: Secondary | ICD-10-CM | POA: Diagnosis not present

## 2012-08-30 DIAGNOSIS — M25519 Pain in unspecified shoulder: Secondary | ICD-10-CM | POA: Diagnosis not present

## 2012-09-04 DIAGNOSIS — M25519 Pain in unspecified shoulder: Secondary | ICD-10-CM | POA: Diagnosis not present

## 2012-09-05 ENCOUNTER — Telehealth (INDEPENDENT_AMBULATORY_CARE_PROVIDER_SITE_OTHER): Payer: Self-pay | Admitting: General Surgery

## 2012-09-05 ENCOUNTER — Other Ambulatory Visit (HOSPITAL_BASED_OUTPATIENT_CLINIC_OR_DEPARTMENT_OTHER): Payer: Medicare Other

## 2012-09-05 ENCOUNTER — Ambulatory Visit (HOSPITAL_BASED_OUTPATIENT_CLINIC_OR_DEPARTMENT_OTHER): Payer: Medicare Other | Admitting: Oncology

## 2012-09-05 ENCOUNTER — Telehealth: Payer: Self-pay | Admitting: *Deleted

## 2012-09-05 VITALS — BP 156/83 | HR 84 | Temp 97.9°F | Resp 20 | Ht 62.0 in | Wt 144.2 lb

## 2012-09-05 DIAGNOSIS — C44599 Other specified malignant neoplasm of skin of other part of trunk: Secondary | ICD-10-CM

## 2012-09-05 DIAGNOSIS — Z8571 Personal history of Hodgkin lymphoma: Secondary | ICD-10-CM | POA: Diagnosis not present

## 2012-09-05 DIAGNOSIS — C50919 Malignant neoplasm of unspecified site of unspecified female breast: Secondary | ICD-10-CM | POA: Diagnosis not present

## 2012-09-05 DIAGNOSIS — E039 Hypothyroidism, unspecified: Secondary | ICD-10-CM

## 2012-09-05 DIAGNOSIS — I4891 Unspecified atrial fibrillation: Secondary | ICD-10-CM

## 2012-09-05 LAB — CBC WITH DIFFERENTIAL/PLATELET
BASO%: 1 % (ref 0.0–2.0)
EOS%: 8.8 % — ABNORMAL HIGH (ref 0.0–7.0)
HCT: 37.9 % (ref 34.8–46.6)
LYMPH%: 21 % (ref 14.0–49.7)
MCH: 30.5 pg (ref 25.1–34.0)
MCHC: 32.3 g/dL (ref 31.5–36.0)
NEUT%: 58.8 % (ref 38.4–76.8)
lymph#: 1.5 10*3/uL (ref 0.9–3.3)

## 2012-09-05 LAB — COMPREHENSIVE METABOLIC PANEL (CC13)
ALT: 25 U/L (ref 0–55)
AST: 24 U/L (ref 5–34)
Alkaline Phosphatase: 116 U/L (ref 40–150)
Chloride: 104 mEq/L (ref 98–107)
Creatinine: 0.8 mg/dL (ref 0.6–1.1)
Total Bilirubin: 0.3 mg/dL (ref 0.20–1.20)

## 2012-09-05 NOTE — Telephone Encounter (Signed)
[name] WITH DR. Gita Kudo' OFFICE CALLED TO REQUEST A PORTA-CATH REMOVAL FOR MS [name]/ [name]- [phone]/ [place]

## 2012-09-05 NOTE — Progress Notes (Signed)
Hematology and Oncology Follow Up Visit  [name] [id number] June 02, 1946 66 y.o. 09/05/2012    HPI: [name] is a 66 year old [place] woman with a history of a T1b, N0 triple negative right breast carcinoma,  Now with biopsy recurrent disease on right chest wall.  Completion 4/4 planned cycles of adjuvant every 3 week Taxotere/Cytoxan.  2  Hospitalization for febrile neutropenia, and recent hospitalization for fluid overload following cycle 2.  3. History of atrial fibrillation, LBBB, hypertension.  4. Remote history of Hodgkin's disease.  5. Hypothyroidism. 6.  completion of radiation to right breast and implant area 07/2012   Interim History:   [name] is seen today unaccompanied after completing the radiation therapy as noted. She is doing well she has no complaints. She starts her port in and so isn't having is removed. She has followup in radiation oncologist. Her cardiac status is been stable. She is on the same medication as before.  Review of Systems: Constitutional:  no weight loss, fever, night sweats, feels well and fatigued Eyes: uses glasses ENT: no complaints Cardiovascular: no chest pain or dyspnea on exertion Respiratory: positive for - cough Neurological: no TIA or stroke symptoms Dermatological: negative Gastrointestinal: no abdominal pain, change in bowel habits, or black or bloody stools Genito-Urinary: no dysuria, trouble voiding, or hematuria Hematological and Lymphatic: negative Breast: negative Musculoskeletal: negative Remaining ROS negative.   Medications:   I have reviewed the patient's current medications.  Current Outpatient Prescriptions  Medication Sig Dispense Refill  . calcium carbonate (OS-CAL) 600 MG TABS Take 600 mg by mouth 2 (two) times daily with a meal.       . carvedilol (COREG) 12.5 MG tablet Take 18.75 mg by mouth 2 (two) times daily with a meal.      . Cholecalciferol (VITAMIN D3) 3000 UNITS TABS Take 2,000  Units by mouth daily.       . hyaluronate sodium (RADIAPLEXRX) GEL Apply topically 2 (two) times daily.      Marland Kitchen levothyroxine (SYNTHROID, LEVOTHROID) 125 MCG tablet Take 125 mcg by mouth daily before breakfast.       . lidocaine-prilocaine (EMLA) cream Apply to port site 1 to 3 hours prior to use  30 g  1  . magnesium oxide (MAG-OX 400) 400 MG tablet Take 400 mg by mouth 2 (two) times daily.       . Multiple Vitamin (MULTIVITAMIN) tablet Take 2 tablets by mouth daily. Natural multivitamin      . non-metallic deodorant (ALRA) MISC Apply 1 application topically daily as needed.      Marland Kitchen OVER THE COUNTER MEDICATION Take 1 tablet by mouth 2 (two) times daily. osteodenx supplement       . OVER THE COUNTER MEDICATION Take 3 tablets by mouth daily at 12 noon. Vegetarian omega green supplement       . OVER THE COUNTER MEDICATION Take 2 capsules by mouth every morning. mental clarity supplement         Allergies:  Allergies  Allergen Reactions  . Benazepril     cough  . Epinephrine     REACTION: BUZZ  . Iohexol      Desc: SNEEZING, pt states this happened in 1988. Has had iv contrast since without being premedicated and has had no problems. Chest ct 2005- LC 05/09/12     Physical Exam: Filed Vitals:   09/05/12 1455  BP: 156/83  Pulse: 84  Temp: 97.9 F (36.6 C)  Resp: 20    Body  mass index is 26.37 kg/(m^2). Weight: 142 lbs. HEENT:  Sclerae anicteric, conjunctivae pink.  Oropharynx clear.  No mucositis or candidiasis.   Nodes:  No cervical, supraclavicular, or axillary lymphadenopathy palpated.  Breast Exam: Right implant is is stable there is radiation related changes no obvious evidence of recurrence. Port is in place no tenderness or discharge. Left breast normal left axilla normal.   Lungs:  Clear to auscultation bilaterally.  No crackles, rhonchi, or wheezes.   Heart:  Regular rate and rhythm. Abdomen:  Soft, nontender.  Positive bowel sounds.  No organomegaly or masses palpated.     Musculoskeletal:  No focal spinal tenderness to palpation.  Extremities:  Benign.  No peripheral edema or cyanosis.   Skin:  Benign.   Neuro:  Nonfocal, alert and oriented x 3.   Lab Results: Lab Results  Component Value Date   WBC 7.1 09/05/2012   HGB 12.3 09/05/2012   HCT 37.9 09/05/2012   MCV 94.4 09/05/2012   PLT 255 09/05/2012   NEUTROABS 4.1 09/05/2012     Chemistry      Component Value Date/Time   NA 143 09/05/2012 1438   NA 137 06/26/2012 1314   K 4.8 09/05/2012 1438   K 4.7 06/26/2012 1314   CL 104 09/05/2012 1438   CL 97 06/26/2012 1314   CO2 31* 09/05/2012 1438   CO2 29 06/26/2012 1314   BUN 16.0 09/05/2012 1438   BUN 18 06/26/2012 1314   CREATININE 0.8 09/05/2012 1438   CREATININE 0.77 06/26/2012 1314      Component Value Date/Time   CALCIUM 9.4 09/05/2012 1438   CALCIUM 8.9 06/26/2012 1314   ALKPHOS 116 09/05/2012 1438   ALKPHOS 108 06/26/2012 1314   AST 24 09/05/2012 1438   AST 20 06/26/2012 1314   ALT 25 09/05/2012 1438   ALT 24 06/26/2012 1314   BILITOT 0.30 09/05/2012 1438   BILITOT 0.3 06/26/2012 1314      Lab Results  Component Value Date   LABCA2 32 02/17/2012     Assessment:  [name] is a 66 year old [place] woman with a history of a T1b, N0 triple negative right breast carcinoma,  for which she the right lumpectomy with sentinel node dissection followed by a right mastectomy in 2009, no adjuvant therapy at that time. No biopsy-proven recurrent chest wall disease. COmpletion cycle 4/4 planned adjuvant every 3 week Taxotere/Cytoxan Status post completion of radiation therapy to right breast.     3. History of atrial fibrillation, LBBB, hypertension.  4. Remote history of Hodgkin's disease.  5. Hypothyroidism.    Plan:  Patient returns for followup. She is doing well. No active evidence of recurrence. I will see her in 6 months time. I've referred her back to the surgeon to get her port removed.  While her risks of further recurrence and available I  will need to be vigilant about this.  Kingsley Herandez,  md 09/05/2012

## 2012-09-05 NOTE — Telephone Encounter (Signed)
refer back to dr Johna Sheriff for port removal

## 2012-09-07 ENCOUNTER — Telehealth (INDEPENDENT_AMBULATORY_CARE_PROVIDER_SITE_OTHER): Payer: Self-pay

## 2012-09-07 DIAGNOSIS — M25519 Pain in unspecified shoulder: Secondary | ICD-10-CM | POA: Diagnosis not present

## 2012-09-07 NOTE — Telephone Encounter (Signed)
Call to [name] to complete Pre-Surgical Port-A-Cath form.  Patient requested that I call back later today.

## 2012-09-07 NOTE — Telephone Encounter (Signed)
PAC removal surgery form completed

## 2012-09-11 DIAGNOSIS — M25519 Pain in unspecified shoulder: Secondary | ICD-10-CM | POA: Diagnosis not present

## 2012-09-14 DIAGNOSIS — M25519 Pain in unspecified shoulder: Secondary | ICD-10-CM | POA: Diagnosis not present

## 2012-09-17 ENCOUNTER — Encounter: Payer: Self-pay | Admitting: Radiation Oncology

## 2012-09-17 DIAGNOSIS — M25519 Pain in unspecified shoulder: Secondary | ICD-10-CM | POA: Diagnosis not present

## 2012-09-18 ENCOUNTER — Encounter: Payer: Self-pay | Admitting: Radiation Oncology

## 2012-09-18 ENCOUNTER — Ambulatory Visit
Admission: RE | Admit: 2012-09-18 | Discharge: 2012-09-18 | Disposition: A | Discharge status: Home / Self Care | Payer: Medicare Other | Source: Ambulatory Visit | Attending: Radiation Oncology | Admitting: Radiation Oncology

## 2012-09-18 VITALS — BP 138/56 | HR 77 | Temp 98.1°F | Resp 20 | Wt 145.0 lb

## 2012-09-18 DIAGNOSIS — C50919 Malignant neoplasm of unspecified site of unspecified female breast: Secondary | ICD-10-CM

## 2012-09-18 NOTE — Progress Notes (Signed)
Patient here f/u s/p radiation therapy right breast on 07/10/12-08/13/12 Right breast well healed one spot with slight erythema on top of breast, patient alert,oriented x3, once in a while a tiny pain in right breast, patient states she is going to start at the [facility] "silver sne[employer] group", exercises. meds updated, Port removal 10/03/12 with Dr. Johna Sheriff No chemoptherapy 10:08 AM

## 2012-09-18 NOTE — Progress Notes (Signed)
Followup note:  [name] returns today approximately 5 weeks following completion of radiation therapy to her right chest wall following surgical excision of a right chest wall recurrence. She is without complaints today. She sees Dr. Johna Sheriff every 6 months for a followup visit and also Dr. Donnie Coffin. She'll have her Port-A-Cath out in the near future.  Physical examination: Alert and oriented. Wt Readings from Last 3 Encounters:  09/18/12 145 lb (65.772 kg)  09/05/12 144 lb 3.2 oz (65.409 kg)  08/13/12 142 lb 14.4 oz (64.819 kg)   Temp Readings from Last 3 Encounters:  09/18/12 98.1 F (36.7 C) Oral  09/05/12 97.9 F (36.6 C) Oral  08/13/12 98.6 F (37 C) Oral   BP Readings from Last 3 Encounters:  09/18/12 138/56  09/05/12 156/83  08/13/12 131/75   Pulse Readings from Last 3 Encounters:  09/18/12 77  09/05/12 84  08/13/12 74   Nodes: Without palpable cervical, supraclavicular, or axillary lymphadenopathy. Chest: There is residual hyperpigmentation of the skin along her chest recurrence along the superior aspect of her right breast reconstruction. No visible or palpable evidence for recurrent disease. Extremities: Without edema.  Impression: Satisfactory progress with no evidence for recurrent disease.  Plan: Followup through Dr. Johna Sheriff and Dr. Donnie Coffin.

## 2012-09-19 NOTE — Progress Notes (Signed)
Dr. Johna Sheriff : When you can, we need orders put in [facility] for [name] Surg is 10/03/12 - coming for preop 09/28/12 Thank you

## 2012-09-21 ENCOUNTER — Other Ambulatory Visit (INDEPENDENT_AMBULATORY_CARE_PROVIDER_SITE_OTHER): Payer: Self-pay | Admitting: General Surgery

## 2012-09-25 ENCOUNTER — Encounter (HOSPITAL_COMMUNITY): Payer: Self-pay | Admitting: Pharmacy Technician

## 2012-09-28 ENCOUNTER — Encounter (HOSPITAL_COMMUNITY)
Admission: RE | Admit: 2012-09-28 | Discharge: 2012-09-28 | Disposition: A | Discharge status: Home / Self Care | Payer: Medicare Other | Source: Ambulatory Visit | Attending: General Surgery | Admitting: General Surgery

## 2012-09-28 ENCOUNTER — Encounter (HOSPITAL_COMMUNITY): Payer: Self-pay

## 2012-09-28 DIAGNOSIS — I509 Heart failure, unspecified: Secondary | ICD-10-CM | POA: Diagnosis not present

## 2012-09-28 DIAGNOSIS — I4891 Unspecified atrial fibrillation: Secondary | ICD-10-CM | POA: Diagnosis not present

## 2012-09-28 DIAGNOSIS — I10 Essential (primary) hypertension: Secondary | ICD-10-CM | POA: Diagnosis not present

## 2012-09-28 DIAGNOSIS — Z452 Encounter for adjustment and management of vascular access device: Secondary | ICD-10-CM | POA: Diagnosis not present

## 2012-09-28 DIAGNOSIS — E039 Hypothyroidism, unspecified: Secondary | ICD-10-CM | POA: Diagnosis not present

## 2012-09-28 NOTE — Patient Instructions (Signed)
20      Your procedure is scheduled on:  Wednesday 10/03/2012 at 1030 am  Report to [facility] at  0800 AM.  Call this number if you have problems the morning of surgery: [phone]   Remember:   Do not eat food or drink liquids after midnight!  Take these medicines the morning of surgery with A SIP OF WATER: Carvedilol, Levothyroxine   Do not bring valuables to the hospital.  .  Leave suitcase in the car. After surgery it may be brought to your room.  For patients admitted to the hospital, checkout time is 11:00 AM the day of              Discharge.    Special Instructions: See [facility] Preparing  For Surgery Instruction Sheet. Do not wear jewelry, lotions powders, perfumes. Women do not shave   legs or underarms for 12 hours before showers. Contacts, partial plates, or dentures may not be worn into surgery.                          Patients discharged the day of surgery will not be allowed to drive home.  If going home the same day of surgery, must have someone stay with you   first 24 hrs.at home and arrange for someone to drive you home from the  Hospital.               Please read over the following fact sheets that you were given: MRSA              INFORMATION, Sleep apnea sheet, Incentive Spirometry sheet               Telford Nab.Haylen Shelnutt,RN,BSN [phone]

## 2012-10-03 ENCOUNTER — Encounter (HOSPITAL_COMMUNITY): Payer: Self-pay | Admitting: Anesthesiology

## 2012-10-03 ENCOUNTER — Ambulatory Visit (HOSPITAL_COMMUNITY): Payer: Medicare Other | Admitting: Anesthesiology

## 2012-10-03 ENCOUNTER — Encounter (HOSPITAL_COMMUNITY): Payer: Self-pay | Admitting: *Deleted

## 2012-10-03 ENCOUNTER — Encounter (HOSPITAL_COMMUNITY)
Admission: RE | Disposition: A | Discharge status: Home / Self Care | Payer: Self-pay | Source: Ambulatory Visit | Attending: General Surgery

## 2012-10-03 ENCOUNTER — Ambulatory Visit (HOSPITAL_COMMUNITY)
Admission: RE | Admit: 2012-10-03 | Discharge: 2012-10-03 | Disposition: A | Discharge status: Home / Self Care | Payer: Medicare Other | Source: Ambulatory Visit | Attending: General Surgery | Admitting: General Surgery

## 2012-10-03 DIAGNOSIS — C50919 Malignant neoplasm of unspecified site of unspecified female breast: Secondary | ICD-10-CM | POA: Diagnosis not present

## 2012-10-03 DIAGNOSIS — I509 Heart failure, unspecified: Secondary | ICD-10-CM | POA: Insufficient documentation

## 2012-10-03 DIAGNOSIS — I10 Essential (primary) hypertension: Secondary | ICD-10-CM | POA: Insufficient documentation

## 2012-10-03 DIAGNOSIS — Z452 Encounter for adjustment and management of vascular access device: Secondary | ICD-10-CM | POA: Insufficient documentation

## 2012-10-03 DIAGNOSIS — E039 Hypothyroidism, unspecified: Secondary | ICD-10-CM | POA: Diagnosis not present

## 2012-10-03 DIAGNOSIS — D649 Anemia, unspecified: Secondary | ICD-10-CM | POA: Diagnosis not present

## 2012-10-03 DIAGNOSIS — I4891 Unspecified atrial fibrillation: Secondary | ICD-10-CM | POA: Diagnosis not present

## 2012-10-03 HISTORY — PX: PORT-A-CATH REMOVAL: SHX5289

## 2012-10-03 SURGERY — REMOVAL PORT-A-CATH
Anesthesia: Monitor Anesthesia Care | Site: Chest | Wound class: Clean

## 2012-10-03 MED ORDER — MIDAZOLAM HCL 5 MG/5ML IJ SOLN
INTRAMUSCULAR | Status: DC | PRN
  Administered 2012-10-03 (×2): 1 mg via INTRAVENOUS

## 2012-10-03 MED ORDER — LIDOCAINE HCL 2 % IJ SOLN
INTRAMUSCULAR | Status: AC
  Filled 2012-10-03: qty 20

## 2012-10-03 MED ORDER — LACTATED RINGERS IV SOLN: INTRAVENOUS | Status: DC

## 2012-10-03 MED ORDER — FENTANYL CITRATE 0.05 MG/ML IJ SOLN
INTRAMUSCULAR | Status: DC | PRN
  Administered 2012-10-03: 100 ug via INTRAVENOUS

## 2012-10-03 MED ORDER — LIDOCAINE-EPINEPHRINE 2 %-1:100000 IJ SOLN
INTRAMUSCULAR | Status: AC
  Filled 2012-10-03: qty 1

## 2012-10-03 MED ORDER — HYDROMORPHONE HCL PF 1 MG/ML IJ SOLN: 0.2500 mg | INTRAMUSCULAR | Status: DC | PRN

## 2012-10-03 MED ORDER — LACTATED RINGERS IV SOLN
INTRAVENOUS | Status: DC
  Administered 2012-10-03: 1000 mL via INTRAVENOUS

## 2012-10-03 MED ORDER — PROMETHAZINE HCL 25 MG/ML IJ SOLN: 6.2500 mg | INTRAMUSCULAR | Status: DC | PRN

## 2012-10-03 MED ORDER — LIDOCAINE HCL (PF) 2 % IJ SOLN
INTRAMUSCULAR | Status: DC | PRN
  Administered 2012-10-03: 10 mL

## 2012-10-03 MED ORDER — PROPOFOL 10 MG/ML IV EMUL
INTRAVENOUS | Status: DC | PRN
  Administered 2012-10-03: 75 ug/kg/min via INTRAVENOUS

## 2012-10-03 SURGICAL SUPPLY — 28 items
BENZOIN TINCTURE PRP APPL 2/3 (GAUZE/BANDAGES/DRESSINGS) IMPLANT
BLADE HEX COATED 2.75 (ELECTRODE) ×2 IMPLANT
BLADE SURG 15 STRL LF DISP TIS (BLADE) ×1 IMPLANT
BLADE SURG 15 STRL SS (BLADE) ×1
CLOTH BEACON ORANGE TIMEOUT ST (SAFETY) ×2 IMPLANT
DECANTER SPIKE VIAL GLASS SM (MISCELLANEOUS) ×2 IMPLANT
DERMABOND ADVANCED (GAUZE/BANDAGES/DRESSINGS)
DERMABOND ADVANCED .7 DNX12 (GAUZE/BANDAGES/DRESSINGS) IMPLANT
DRAPE LAPAROTOMY TRNSV 102X78 (DRAPE) ×2 IMPLANT
DRSG TEGADERM 4X4.75 (GAUZE/BANDAGES/DRESSINGS) IMPLANT
ELECT REM PT RETURN 9FT ADLT (ELECTROSURGICAL) ×2
ELECTRODE REM PT RTRN 9FT ADLT (ELECTROSURGICAL) ×1 IMPLANT
GAUZE SPONGE 4X4 16PLY XRAY LF (GAUZE/BANDAGES/DRESSINGS) ×2 IMPLANT
GLOVE BIOGEL PI IND STRL 7.0 (GLOVE) ×1 IMPLANT
GLOVE BIOGEL PI INDICATOR 7.0 (GLOVE) ×1
GOWN STRL NON-REIN LRG LVL3 (GOWN DISPOSABLE) ×2 IMPLANT
GOWN STRL REIN XL XLG (GOWN DISPOSABLE) ×4 IMPLANT
KIT BASIN OR (CUSTOM PROCEDURE TRAY) ×2 IMPLANT
NEEDLE HYPO 22GX1.5 SAFETY (NEEDLE) ×2 IMPLANT
NEEDLE HYPO 25X1 1.5 SAFETY (NEEDLE) ×2 IMPLANT
NS IRRIG 1000ML POUR BTL (IV SOLUTION) ×2 IMPLANT
PACK BASIC VI WITH GOWN DISP (CUSTOM PROCEDURE TRAY) ×2 IMPLANT
PENCIL BUTTON HOLSTER BLD 10FT (ELECTRODE) ×2 IMPLANT
SPONGE GAUZE 4X4 12PLY (GAUZE/BANDAGES/DRESSINGS) ×2 IMPLANT
STRIP CLOSURE SKIN 1/2X4 (GAUZE/BANDAGES/DRESSINGS) IMPLANT
SUT MNCRL AB 4-0 PS2 18 (SUTURE) ×2 IMPLANT
SYR CONTROL 10ML LL (SYRINGE) ×2 IMPLANT
TOWEL OR 17X26 10 PK STRL BLUE (TOWEL DISPOSABLE) ×2 IMPLANT

## 2012-10-03 NOTE — Anesthesia Preprocedure Evaluation (Signed)
Anesthesia Evaluation  Patient identified by MRN, date of birth, ID band Patient awake    Reviewed: Allergy & Precautions, H&P , NPO status , Patient's Chart, lab work & pertinent test results  Airway Mallampati: II TM Distance: >3 FB Neck ROM: Full    Dental No notable dental hx. (+) Teeth Intact and Dental Advisory Given   Pulmonary shortness of breath, pneumonia -,  breath sounds clear to auscultation  Pulmonary exam normal       Cardiovascular hypertension, Pt. on medications +CHF + dysrhythmias Atrial Fibrillation Rhythm:Regular Rate:Normal  Cardiomyopathy   Neuro/Psych negative neurological ROS  negative psych ROS   GI/Hepatic negative GI ROS, Neg liver ROS,   Endo/Other  Hypothyroidism   Renal/GU negative Renal ROS  negative genitourinary   Musculoskeletal negative musculoskeletal ROS (+)   Abdominal   Peds  Hematology negative hematology ROS (+) Hodgkins disease   Anesthesia Other Findings   Reproductive/Obstetrics negative OB ROS CA breast                           Anesthesia Physical Anesthesia Plan  ASA: III  Anesthesia Plan: MAC   Post-op Pain Management:    Induction: Intravenous  Airway Management Planned: Simple Face Mask  Additional Equipment:   Intra-op Plan:   Post-operative Plan:   Informed Consent: I have reviewed the patients History and Physical, chart, labs and discussed the procedure including the risks, benefits and alternatives for the proposed anesthesia with the patient or authorized representative who has indicated his/her understanding and acceptance.   Dental advisory given  Plan Discussed with: CRNA  Anesthesia Plan Comments:         Anesthesia Quick Evaluation

## 2012-10-03 NOTE — Anesthesia Postprocedure Evaluation (Signed)
Anesthesia Post Note  Patient: [name]  Procedure(s) Performed: Procedure(s) (LRB): REMOVAL PORT-A-CATH (N/A)  Anesthesia type: MAC  Patient location: PACU  Post pain: Pain level controlled  Post assessment: Post-op Vital signs reviewed  Last Vitals:  Filed Vitals:   10/03/12 0753  BP: 143/62  Pulse: 75  Temp: 36.7 C  Resp: 18    Post vital signs: Reviewed  Level of consciousness: sedated  Complications: No apparent anesthesia complications

## 2012-10-03 NOTE — H&P (Signed)
  Subjective:    [name] is a 66 y.o. female who presents for portacath removal   Objective:   BP 143/62  Pulse 75  Temp 98 F (36.7 C) (Oral)  Resp 18  SpO2 96%  LMP 03/08/1995  General:  alert and no distress Skin:  normal and no rash or abnormalities Lymph nodes: No cervical, supraclavicular, or axillary nodes palpable  Breasts: Well-healed incision upper outer right reconstructed breast which is soft without any thickening or skin nodularity or other abnormalities  Lungs:  clear to auscultation bilaterally Heart:  regular rate and rhythm     Assessment:  Completed chemoRx, for portacath removal    Plan:   1. Discussed the risk of surgery,  and the risks of general anesthetic including MI, CVA, sudden death or even reaction to anesthetic medications. The patient understands the risks, any and all questions were answered to the patient's satisfaction.

## 2012-10-03 NOTE — Op Note (Signed)
Pre op diagnosis: status post Port-A-Cath  Postop diagnosis: Same  Surgical procedure: Removal of Port-A-Cath  Surgeon: Glenna Fellows  Anesthesia: MAC  Description of procedure: The patient is positioned supine and the anterior chest sterilely prepped and draped.Patient and procedure were verified. Local anesthesia was used to infiltrate the skin and underlying soft tissue. The previous incision was opened and sharp dissection carried down onto the port and catheter. The catheter was withdrawn intact. The port was sharply dissected out of the subcutaneous tissue and removed with all suture material. The subcutaneous tissue was closed with interrupted 4-0 Monocryl and the skin was closed with subcuticular 4-0 Monocryl and Dermabond. Patient tolerated the procedure well.   Mariella Saa MD, FACS  10/03/2012

## 2012-10-03 NOTE — Preoperative (Signed)
Beta Blockers   Reason not to administer Beta Blockers:Took Coreg this am. 

## 2012-10-03 NOTE — Transfer of Care (Signed)
Immediate Anesthesia Transfer of Care Note  Patient: [name]  Procedure(s) Performed: Procedure(s) (LRB) with comments: REMOVAL PORT-A-CATH (N/A)  Patient Location: PACU  Anesthesia Type: MAC  Level of Consciousness: awake, alert , oriented and patient cooperative  Airway & Oxygen Therapy: Patient Spontanous Breathing and Patient connected to face mask oxygen  Post-op Assessment: Report given to PACU RN, Post -op Vital signs reviewed and stable and Patient moving all extremities X 4  Post vital signs: Reviewed and stable  Complications: No apparent anesthesia complications

## 2012-10-04 ENCOUNTER — Encounter (HOSPITAL_COMMUNITY): Payer: Self-pay | Admitting: General Surgery

## 2012-10-11 ENCOUNTER — Other Ambulatory Visit: Payer: Self-pay | Admitting: Cardiovascular Disease

## 2012-10-11 MED ORDER — CARVEDILOL 12.5 MG PO TABS: 18.7500 mg | ORAL_TABLET | Freq: Two times a day (BID) | ORAL | Status: DC

## 2012-10-26 ENCOUNTER — Ambulatory Visit (INDEPENDENT_AMBULATORY_CARE_PROVIDER_SITE_OTHER): Payer: Medicare Other | Admitting: Physician Assistant

## 2012-10-26 ENCOUNTER — Encounter: Payer: Self-pay | Admitting: Physician Assistant

## 2012-10-26 VITALS — BP 113/65 | HR 76 | Ht 62.0 in | Wt 141.4 lb

## 2012-10-26 DIAGNOSIS — R002 Palpitations: Secondary | ICD-10-CM

## 2012-10-26 DIAGNOSIS — I428 Other cardiomyopathies: Secondary | ICD-10-CM

## 2012-10-26 DIAGNOSIS — C50919 Malignant neoplasm of unspecified site of unspecified female breast: Secondary | ICD-10-CM

## 2012-10-26 DIAGNOSIS — I5032 Chronic diastolic (congestive) heart failure: Secondary | ICD-10-CM

## 2012-10-26 DIAGNOSIS — I6529 Occlusion and stenosis of unspecified carotid artery: Secondary | ICD-10-CM

## 2012-10-26 DIAGNOSIS — I10 Essential (primary) hypertension: Secondary | ICD-10-CM

## 2012-10-26 LAB — TSH: TSH: 0.4 u[IU]/mL (ref 0.35–5.50)

## 2012-10-26 LAB — BASIC METABOLIC PANEL
CO2: 30 mEq/L (ref 19–32)
Calcium: 9.5 mg/dL (ref 8.4–10.5)
Chloride: 103 mEq/L (ref 96–112)
Sodium: 141 mEq/L (ref 135–145)

## 2012-10-26 MED ORDER — LOSARTAN POTASSIUM 25 MG PO TABS: 25.0000 mg | ORAL_TABLET | Freq: Every day | ORAL | Status: DC

## 2012-10-26 NOTE — Progress Notes (Signed)
[address] [place], [place]  [address] Phone: [phone], Fax:  [phone]  Date:  10/26/2012   Name:  [name]   DOB:  June 02, 1946   MRN:  [id number]  PCP:  Thora Lance, MD  Primary Cardiologist:  Dr. Charlton Haws  Primary Electrophysiologist:  None    History of Present Illness: [name] is a 66 y.o. female who returns for evaluation of palpitations.  She has a hx of nonischemic cardiomyopathy, HTN, LBBB, hypothyroidism, Hodgkin's disease, breast cancer and AFib.  She underwent radiation therapy for Hodgkin's lymphoma in 1988. She has a hx of breast cancer status post mastectomy in 2009 and recurrent breast cancer in 2013. She has completed radiation, Taxotere and Cytoxan in 7/13. Patient was seen by cardiology during admission in 5/13 for neutropenia and fever as well as acute on chronic diastolic CHF. She had tachycardia. It was suspected that her sinus tachycardia was driven by her fever. She was last seen by Dr. Eden Emms 12/12.   Over the last few weeks she has noted an irregular heartbeat. She denies rapid palpitations. She does feel anxious with this. She denies associated syncope or near-syncope. She denies chest discomfort. She denies orthopnea or PND or edema. She denies exertional chest pain or shortness of breath. She denies any increased caffeine intake.  Labs (9/13):   K 4.8, creatinine 0.8, ALT 25, Hgb 12.3  Wt Readings from Last 3 Encounters:  09/28/12 147 lb 11.2 oz (66.996 kg)  09/18/12 145 lb (65.772 kg)  09/05/12 144 lb 3.2 oz (65.409 kg)     Past Medical History  Diagnosis Date  . Atrial fibrillation     dx in 2008;  CHADS2-VASc=5  . LBBB (left bundle branch block)   . NICM (nonischemic cardiomyopathy)     a. EF as low as 45%;  b. LHC 5/05:  LM 10%, pLAD 30%, EF 45-50%,   c. Echo 5/13:  EF 60-65%, Gr 1 diast dysfn, MAC  . HTN (hypertension)   . Hypothyroidism     2/2 XRT for Hodgkins Lymphoma  . Raynaud's  disease   . Anemia   . Blood transfusion     1968  . Arthritis     fingers   . Hodgkin's disease(201)     stage 2b;  s/p XRT 1988  . Breast cancer 2009    T1N0M0 DCIS right breast  . Breast cancer 2013    a. reocurrence right breast;  b. s/p Taxotere, Cytoxan and XRT  . Status post radiation therapy 1988    mantle and periaortic   . Mucositis 04/29/2012  . History of radiation therapy 07/10/12-08/13/12    right breast/chest wall  . Carotid stenosis     a. dopplers 8/13:  RICA 0-39%, LICA 60-79% => repeat due in 01/2013    Current Outpatient Prescriptions  Medication Sig Dispense Refill  . calcium carbonate (OS-CAL) 600 MG TABS Take 600 mg by mouth 2 (two) times daily with a meal.       . carvedilol (COREG) 12.5 MG tablet Take 1.5 tablets (18.75 mg total) by mouth 2 (two) times daily with a meal.  90 tablet  3  . cholecalciferol (VITAMIN D) 1000 UNITS tablet Take 2,000 Units by mouth daily.      Marland Kitchen levothyroxine (SYNTHROID, LEVOTHROID) 125 MCG tablet Take 125 mcg by mouth daily before breakfast.       . lidocaine-prilocaine (EMLA) cream Apply to port site 1 to 3 hours prior  to use  30 g  1  . magnesium oxide (MAG-OX 400) 400 MG tablet Take 400 mg by mouth 2 (two) times daily.       . Multiple Vitamin (MULTIVITAMIN) tablet Take 2 tablets by mouth daily. Natural multivitamin      . OVER THE COUNTER MEDICATION Take 1 tablet by mouth 2 (two) times daily. osteodenx supplement      . OVER THE COUNTER MEDICATION Take 3 tablets by mouth daily at 12 noon. Vegetarian omega green supplement      . OVER THE COUNTER MEDICATION Take 2 capsules by mouth every morning. mental clarity supplement        Allergies: Allergies  Allergen Reactions  . Benazepril     cough  . Epinephrine     REACTION: BUZZ  . Iohexol      Desc: SNEEZING, pt states this happened in 1988. Has had iv contrast since without being premedicated and has had no problems. Chest ct 2005- LC 05/09/12     Social History:  The  patient  reports that she has never smoked. She has never used smokeless tobacco. She reports that she does not drink alcohol or use illicit drugs.   ROS:  Please see the history of present illness.   All other systems reviewed and negative.   PHYSICAL EXAM: VS:  BP 113/65  Pulse 76  Ht 5\' 2"  (1.575 m)  Wt 141 lb 6.4 oz (64.139 kg)  BMI 25.86 kg/m2  SpO2 97%  LMP 03/08/1995 Well nourished, well developed, in no acute distress HEENT: normal Neck: no JVD Cardiac:  normal S1, S2; RRR; no murmur Lungs:  clear to auscultation bilaterally, no wheezing, rhonchi or rales Abd: soft, nontender, no hepatomegaly Ext: no edema Skin: warm and dry Neuro:  CNs 2-12 intact, no focal abnormalities noted  EKG:  NSR, HR 72, left axis deviation, interventricular conduction delay      ASSESSMENT AND PLAN:  1. Palpitations:   She has had increased palpitations over the last several weeks. She does have a history fibrillation and flutter as well as SVT. She is currently in sinus rhythm. At this point, I recommend proceeding with an event monitor. Also check a basic metabolic panel, magnesium and TSH. In the event she has atrial fibrillation identified on her monitor, we will need to consider anticoagulation therapy. The patient has a CHADS2-VASc score of 5 (CHF, HTN, carotid stenosis, age over 37, female). She also completed therapy with Taxotere a few mos ago. There is a chance that this can cause reduced LV function. Her echocardiogram prior to completing therapy demonstrated normal LV function. She will have a followup echocardiogram performed as well.  Followup with Dr. Eden Emms in 4 weeks.  2. Nonischemic Cardiomyopathy:   Ejection fraction had recovered in the past. Losartan have been stopped due to problems with hypotension after her completion of chemotherapy. I have recommended she restart losartan 25 mg daily. She is a retired Engineer, civil (consulting). She will keep an eye on her blood pressures. If her blood pressure  drops too low, she will stop the medication and contact us. Otherwise, check a basic metabolic panel in one week.  3. Breast Cancer:   Managed by oncology.  4. Carotid Stenosis:   Followup Dopplers pending in 01/2013.  5. Hypertension:   Controlled.  Diastolic CHF:   Volume stable. Continue current therapy.  Luna Glasgow, PA-C  8:57 AM 10/26/2012

## 2012-10-26 NOTE — Patient Instructions (Addendum)
RESTART COZAAR 25 MG DAILY  LAB TODAY BMET, TSH, MAGNESIUM LEVEL  REPEAT BMET 11/02/12  Your physician has requested that you have an echocardiogram DX 428.32. Echocardiography is a painless test that uses sound waves to create images of your heart. It provides your doctor with information about the size and shape of your heart and how well your heart's chambers and valves are working. This procedure takes approximately one hour. There are no restrictions for this procedure.  Your physician has recommended that you wear an event monitor DX 785.1. Event monitors are medical devices that record the heart's electrical activity. Doctors most often [place] these monitors to diagnose arrhythmias. Arrhythmias are problems with the speed or rhythm of the heartbeat. The monitor is a small, portable device. You can wear one while you do your normal daily activities. This is usually used to diagnose what is causing palpitations/syncope (passing out).  KEEP APPT WITH DR. Eden Emms 11/27/12

## 2012-10-31 ENCOUNTER — Ambulatory Visit (HOSPITAL_COMMUNITY): Payer: Medicare Other | Attending: Physician Assistant | Admitting: Radiology

## 2012-10-31 DIAGNOSIS — Z9221 Personal history of antineoplastic chemotherapy: Secondary | ICD-10-CM | POA: Insufficient documentation

## 2012-10-31 DIAGNOSIS — I5032 Chronic diastolic (congestive) heart failure: Secondary | ICD-10-CM

## 2012-10-31 DIAGNOSIS — I428 Other cardiomyopathies: Secondary | ICD-10-CM | POA: Diagnosis not present

## 2012-10-31 DIAGNOSIS — I059 Rheumatic mitral valve disease, unspecified: Secondary | ICD-10-CM | POA: Insufficient documentation

## 2012-10-31 DIAGNOSIS — Z853 Personal history of malignant neoplasm of breast: Secondary | ICD-10-CM | POA: Diagnosis not present

## 2012-10-31 DIAGNOSIS — R002 Palpitations: Secondary | ICD-10-CM

## 2012-10-31 DIAGNOSIS — I447 Left bundle-branch block, unspecified: Secondary | ICD-10-CM | POA: Diagnosis not present

## 2012-10-31 DIAGNOSIS — I4891 Unspecified atrial fibrillation: Secondary | ICD-10-CM | POA: Diagnosis not present

## 2012-10-31 DIAGNOSIS — Z923 Personal history of irradiation: Secondary | ICD-10-CM | POA: Diagnosis not present

## 2012-10-31 DIAGNOSIS — I509 Heart failure, unspecified: Secondary | ICD-10-CM

## 2012-10-31 DIAGNOSIS — I10 Essential (primary) hypertension: Secondary | ICD-10-CM | POA: Insufficient documentation

## 2012-10-31 DIAGNOSIS — I498 Other specified cardiac arrhythmias: Secondary | ICD-10-CM | POA: Diagnosis not present

## 2012-10-31 NOTE — Progress Notes (Signed)
Echocardiogram performed.  

## 2012-11-01 ENCOUNTER — Encounter: Payer: Self-pay | Admitting: Physician Assistant

## 2012-11-01 ENCOUNTER — Telehealth: Payer: Self-pay | Admitting: *Deleted

## 2012-11-01 NOTE — Telephone Encounter (Signed)
lmom echo normal 

## 2012-11-01 NOTE — Telephone Encounter (Signed)
Message copied by Tarri Fuller on Thu Nov 01, 2012  4:37 PM ------      Message from: [place], [place] T      Created: Thu Nov 01, 2012  4:22 PM       Normal LV function.      Tereso Newcomer, PA-C  4:21 PM 11/01/2012

## 2012-11-02 ENCOUNTER — Other Ambulatory Visit: Payer: Medicare Other

## 2012-11-02 ENCOUNTER — Other Ambulatory Visit (INDEPENDENT_AMBULATORY_CARE_PROVIDER_SITE_OTHER): Payer: Medicare Other

## 2012-11-02 DIAGNOSIS — I428 Other cardiomyopathies: Secondary | ICD-10-CM | POA: Diagnosis not present

## 2012-11-02 DIAGNOSIS — I5032 Chronic diastolic (congestive) heart failure: Secondary | ICD-10-CM | POA: Diagnosis not present

## 2012-11-02 DIAGNOSIS — R002 Palpitations: Secondary | ICD-10-CM | POA: Diagnosis not present

## 2012-11-02 LAB — BASIC METABOLIC PANEL
CO2: 29 mEq/L (ref 19–32)
Calcium: 9.7 mg/dL (ref 8.4–10.5)
Sodium: 143 mEq/L (ref 135–145)

## 2012-11-05 ENCOUNTER — Telehealth: Payer: Self-pay | Admitting: *Deleted

## 2012-11-05 DIAGNOSIS — I5033 Acute on chronic diastolic (congestive) heart failure: Secondary | ICD-10-CM

## 2012-11-05 NOTE — Telephone Encounter (Signed)
pt cannot come in today for repeat bmet due to her mother-in-law is in the hospital. she will come in tomorrow tuesday 11/19. says he takes 2 multivitamins daily; I asked for her to cut back to 1 multi daily and to watch dietary K+, verbalized understand

## 2012-11-05 NOTE — Telephone Encounter (Signed)
Message copied by Tarri Fuller on Mon Nov 05, 2012  9:28 AM ------      Message from: [place], [place] T      Created: Sat Nov 03, 2012  3:12 PM       K+ elevated.      Repeat BMET on Monday 11/18 (? If sample was hemolyzed)      Make sure she does not have a lot of K+ in her diet.      Also make sure she does not take K+ supplement.      Tereso Newcomer, PA-C  3:12 PM 11/03/2012

## 2012-11-06 ENCOUNTER — Other Ambulatory Visit: Payer: Medicare Other

## 2012-11-07 ENCOUNTER — Other Ambulatory Visit (INDEPENDENT_AMBULATORY_CARE_PROVIDER_SITE_OTHER): Payer: Medicare Other

## 2012-11-07 ENCOUNTER — Telehealth: Payer: Self-pay | Admitting: *Deleted

## 2012-11-07 DIAGNOSIS — R002 Palpitations: Secondary | ICD-10-CM

## 2012-11-07 DIAGNOSIS — I5033 Acute on chronic diastolic (congestive) heart failure: Secondary | ICD-10-CM | POA: Diagnosis not present

## 2012-11-07 LAB — BASIC METABOLIC PANEL
BUN: 18 mg/dL (ref 6–23)
Chloride: 102 mEq/L (ref 96–112)
Potassium: 4.4 mEq/L (ref 3.5–5.1)

## 2012-11-07 NOTE — Telephone Encounter (Signed)
lmom labs good, no changes to be made 

## 2012-11-07 NOTE — Telephone Encounter (Signed)
Message copied by Tarri Fuller on Wed Nov 07, 2012  5:48 PM ------      Message from: [place], [place] T      Created: Wed Nov 07, 2012  5:10 PM       Potassium and kidney function look good.      Continue with current treatment plan.      Tereso Newcomer, PA-C  4:49 PM 11/01/2012

## 2012-11-12 DIAGNOSIS — Z23 Encounter for immunization: Secondary | ICD-10-CM | POA: Diagnosis not present

## 2012-11-19 DIAGNOSIS — E7800 Pure hypercholesterolemia, unspecified: Secondary | ICD-10-CM | POA: Diagnosis not present

## 2012-11-19 DIAGNOSIS — M899 Disorder of bone, unspecified: Secondary | ICD-10-CM | POA: Diagnosis not present

## 2012-11-19 DIAGNOSIS — Z1331 Encounter for screening for depression: Secondary | ICD-10-CM | POA: Diagnosis not present

## 2012-11-19 DIAGNOSIS — R0989 Other specified symptoms and signs involving the circulatory and respiratory systems: Secondary | ICD-10-CM | POA: Diagnosis not present

## 2012-11-19 DIAGNOSIS — E039 Hypothyroidism, unspecified: Secondary | ICD-10-CM | POA: Diagnosis not present

## 2012-11-19 DIAGNOSIS — C50919 Malignant neoplasm of unspecified site of unspecified female breast: Secondary | ICD-10-CM | POA: Diagnosis not present

## 2012-11-22 ENCOUNTER — Telehealth: Payer: Self-pay | Admitting: *Deleted

## 2012-11-22 ENCOUNTER — Other Ambulatory Visit: Payer: Self-pay | Admitting: *Deleted

## 2012-11-22 DIAGNOSIS — I428 Other cardiomyopathies: Secondary | ICD-10-CM

## 2012-11-22 DIAGNOSIS — R002 Palpitations: Secondary | ICD-10-CM

## 2012-11-22 DIAGNOSIS — I5032 Chronic diastolic (congestive) heart failure: Secondary | ICD-10-CM

## 2012-11-22 NOTE — Telephone Encounter (Signed)
Pt was enrolled for event monitor to be mailed 11/05/12

## 2012-11-22 NOTE — Telephone Encounter (Signed)
Message copied by Leotis Pain on Thu Nov 22, 2012  3:27 PM ------      Message from: Mariane Masters D      Created: Tue Nov 06, 2012  9:58 AM      Regarding: FW: MONITOR                   ----- Message -----         From: Gerome Sam         Sent: 10/26/2012  11:33 AM           To: Palma Holter, #      Subject: MONITOR                                                  10/26/12 Patient needs a monitor.            Thanks

## 2012-11-27 ENCOUNTER — Encounter: Payer: Self-pay | Admitting: Cardiovascular Disease

## 2012-11-27 ENCOUNTER — Ambulatory Visit (INDEPENDENT_AMBULATORY_CARE_PROVIDER_SITE_OTHER): Payer: Medicare Other | Admitting: Cardiovascular Disease

## 2012-11-27 VITALS — BP 138/76 | HR 76 | Resp 18 | Ht 62.0 in | Wt 144.8 lb

## 2012-11-27 DIAGNOSIS — Z79899 Other long term (current) drug therapy: Secondary | ICD-10-CM

## 2012-11-27 DIAGNOSIS — I4891 Unspecified atrial fibrillation: Secondary | ICD-10-CM | POA: Diagnosis not present

## 2012-11-27 DIAGNOSIS — I429 Cardiomyopathy, unspecified: Secondary | ICD-10-CM

## 2012-11-27 DIAGNOSIS — I6529 Occlusion and stenosis of unspecified carotid artery: Secondary | ICD-10-CM | POA: Diagnosis not present

## 2012-11-27 DIAGNOSIS — R0989 Other specified symptoms and signs involving the circulatory and respiratory systems: Secondary | ICD-10-CM

## 2012-11-27 MED ORDER — FLECAINIDE ACETATE 50 MG PO TABS: 50.0000 mg | ORAL_TABLET | Freq: Two times a day (BID) | ORAL | Status: DC

## 2012-11-27 NOTE — Assessment & Plan Note (Signed)
EF normal by recent echo on ARB no CHF symptoms

## 2012-11-27 NOTE — Assessment & Plan Note (Signed)
F/U duplex in 1/61 60-79% LICA

## 2012-11-27 NOTE — Patient Instructions (Signed)
Your physician recommends that you schedule a follow-up appointment in: FEB WITH DR Eden Emms AND CAROTID SAME DAY  Your physician has recommended you make the following change in your medication:   START FLECAINIDE 50 MG 1 TWICE DAILY   Your physician has requested that you have an exercise tolerance test. For further information please visit [email or web address] Please also follow instruction sheet, as given. 7-10  DAYS   AFTER STARTING  FLECAINIDE  Your physician has requested that you have a carotid duplex. This test is an ultrasound of the carotid arteries in your neck. It looks at blood flow through these arteries that supply the brain with blood. Allow one hour for this exam. There are no restrictions or special instructions. DUE IN FEB  SEE DR Eden Emms SAME DAY

## 2012-11-27 NOTE — Progress Notes (Signed)
Patient ID: [name], female   DOB: 23-Dec-1945, 66 y.o.   MRN: [id number] She has a hx of nonischemic cardiomyopathy, HTN, LBBB, hypothyroidism, Hodgkin's disease, breast cancer and AFib. She underwent radiation therapy for Hodgkin's lymphoma in 1988. She has a hx of breast cancer status post mastectomy in 2009 and recurrent breast cancer in 2013. She has completed radiation, Taxotere and Cytoxan in 7/13. Patient was seen by cardiology during admission in 5/13 for neutropenia and fever as well as acute on chronic diastolic CHF. She had tachycardia. It was suspected that her sinus tachycardia was driven by her fever.    Denies chest discomfort. She denies orthopnea or PND or edema. She denies exertional chest pain or shortness of breath. She denies any increased caffeine intake. More palpitations with documented PAF.  Discussed need for antiarrythmic and proarrythmia.  Think flecainide is best choice since no documented CAD and normal EF  Echo 11/13  Study Conclusions  - Left ventricle: Systolic function was normal. The estimated ejection fraction was in the range of 60% to 65%. Doppler parameters are consistent with abnormal left ventricular relaxation (grade 1 diastolic dysfunction). - Aortic valve: Trivial regurgitation. - Mitral valve: Mild regurgitation.  60-79% LICA needs f/u duplec in 2/14   ROS: Denies fever, malais, weight loss, blurry vision, decreased visual acuity, cough, sputum, SOB, hemoptysis, pleuritic pain, palpitaitons, heartburn, abdominal pain, melena, lower extremity edema, claudication, or rash.  All other systems reviewed and negative  General: Affect appropriate Healthy:  appears stated age HEENT: normal Neck supple with no adenopathy JVP normal left  bruits no thyromegaly Lungs clear with no wheezing and good diaphragmatic motion Heart:  S1/S2 no murmur, no rub, gallop or click PMI normal Abdomen: benighn, BS positve, no tenderness, no AAA no bruit.  No  HSM or HJR Distal pulses intact with no bruits No edema Neuro non-focal Skin warm and dry No muscular weakness   Current Outpatient Prescriptions  Medication Sig Dispense Refill  . calcium carbonate (OS-CAL) 600 MG TABS Take 600 mg by mouth 2 (two) times daily with a meal.       . carvedilol (COREG) 12.5 MG tablet Take 1.5 tablets (18.75 mg total) by mouth 2 (two) times daily with a meal.  90 tablet  3  . cholecalciferol (VITAMIN D) 1000 UNITS tablet Take 2,000 Units by mouth daily.      Marland Kitchen levothyroxine (SYNTHROID, LEVOTHROID) 125 MCG tablet Take 125 mcg by mouth daily before breakfast.       . losartan (COZAAR) 25 MG tablet Take 1 tablet (25 mg total) by mouth daily.      . magnesium oxide (MAG-OX 400) 400 MG tablet Take 400 mg by mouth 2 (two) times daily.       . Multiple Vitamin (MULTIVITAMIN) tablet Take 1 tablet by mouth daily. Natural multivitamin      . OVER THE COUNTER MEDICATION Take 1 tablet by mouth 2 (two) times daily. osteodenx supplement      . OVER THE COUNTER MEDICATION Take 3 tablets by mouth daily at 12 noon. Vegetarian omega green supplement      . OVER THE COUNTER MEDICATION Take 2 capsules by mouth every morning. mental clarity supplement        Allergies  Benazepril; Epinephrine; and Iohexol  Electrocardiogram:  10/26/12  SR rate 72 normal  Event monitor Episodes of PAF  Assessment and Plan

## 2012-11-27 NOTE — Assessment & Plan Note (Signed)
Well controlled.  Continue current medications and low sodium Dash type diet.   Back on ARB

## 2012-11-27 NOTE — Assessment & Plan Note (Signed)
PAF persistant.  ON beta blocker Start flecainide ETT next week If cannot contain episodes will need to start coumadin or xarelto

## 2012-11-28 ENCOUNTER — Other Ambulatory Visit: Payer: Self-pay | Admitting: Cardiovascular Disease

## 2012-11-28 DIAGNOSIS — Z79899 Other long term (current) drug therapy: Secondary | ICD-10-CM

## 2012-11-28 MED ORDER — FLECAINIDE ACETATE 100 MG PO TABS: 50.0000 mg | ORAL_TABLET | Freq: Two times a day (BID) | ORAL | Status: DC

## 2012-11-29 ENCOUNTER — Telehealth: Payer: Self-pay | Admitting: *Deleted

## 2012-11-29 NOTE — Telephone Encounter (Signed)
End of Summary report of event monitor rec'd by D. Mathis. TK

## 2012-12-04 ENCOUNTER — Ambulatory Visit (INDEPENDENT_AMBULATORY_CARE_PROVIDER_SITE_OTHER): Payer: Medicare Other | Admitting: Physician Assistant

## 2012-12-04 DIAGNOSIS — Z79899 Other long term (current) drug therapy: Secondary | ICD-10-CM | POA: Diagnosis not present

## 2012-12-04 DIAGNOSIS — R0602 Shortness of breath: Secondary | ICD-10-CM

## 2012-12-04 NOTE — Progress Notes (Signed)
Exercise Treadmill Test  Pre-Exercise Testing Evaluation Rhythm: normal sinus  Rate: 76   PR:  .15 QRS:  .14  QT:  .43 QTc: .48           Test  Exercise Tolerance Test Ordering MD: Charlton Haws, MD  Interpreting MD: Charlton Haws, MD  Unique Test No: 1  Treadmill:  2  Indication for ETT: Dyspnea  Contraindication to ETT: No   Stress Modality: exercise - treadmill  Cardiac Imaging Performed: non   Protocol: standard Bruce - maximal  Max BP:  166/66  Max MPHR (bpm):  113 85% MPR (bpm):  n/a  MPHR obtained (bpm):  113 % MPHR obtained:  75%  Reached 85% MPHR (min:sec):  n/a Total Exercise Time (min-sec):  4:03  Workload in METS:  7.20 Borg Scale: 15  Reason ETT Terminated:  fatigue    ST Segment Analysis At Rest: non-specific ST segment slurring With Exercise: no evidence of significant ST depression  Other Information Arrhythmia:  No Angina during ETT:  absent (0) Quality of ETT:  diagnostic  ETT Interpretation:  normal - no evidence of ischemia by ST analysis  Comments: Study done to R/O pro arrhythmia on flecainide  Basline ECG LBBB  No PVC;s or arrhythmia  Recommendations: Continue flecainide

## 2012-12-05 ENCOUNTER — Telehealth: Payer: Self-pay | Admitting: Cardiovascular Disease

## 2012-12-05 NOTE — Telephone Encounter (Signed)
New Problem:    Patient called in wanting to know when her follow-up with Dr. Eden Emms for her GXT would be.  Please call back.

## 2012-12-06 NOTE — Telephone Encounter (Signed)
lmom pt's f/u w/Nishan is 02/12/13 @ 10 am

## 2012-12-06 NOTE — Telephone Encounter (Signed)
lmom pt's f/u w/Nishan is 02/12/13 @ 10 am 

## 2012-12-17 ENCOUNTER — Other Ambulatory Visit: Payer: Self-pay | Admitting: Dermatology

## 2012-12-17 DIAGNOSIS — D485 Neoplasm of uncertain behavior of skin: Secondary | ICD-10-CM | POA: Diagnosis not present

## 2012-12-17 DIAGNOSIS — L821 Other seborrheic keratosis: Secondary | ICD-10-CM | POA: Diagnosis not present

## 2012-12-17 DIAGNOSIS — D235 Other benign neoplasm of skin of trunk: Secondary | ICD-10-CM | POA: Diagnosis not present

## 2012-12-17 DIAGNOSIS — D239 Other benign neoplasm of skin, unspecified: Secondary | ICD-10-CM | POA: Diagnosis not present

## 2012-12-18 ENCOUNTER — Telehealth: Payer: Self-pay | Admitting: Cardiovascular Disease

## 2012-12-18 DIAGNOSIS — I428 Other cardiomyopathies: Secondary | ICD-10-CM

## 2012-12-18 DIAGNOSIS — R002 Palpitations: Secondary | ICD-10-CM

## 2012-12-18 DIAGNOSIS — I5032 Chronic diastolic (congestive) heart failure: Secondary | ICD-10-CM

## 2012-12-18 MED ORDER — LOSARTAN POTASSIUM 25 MG PO TABS: 25.0000 mg | ORAL_TABLET | Freq: Every day | ORAL | Status: DC

## 2012-12-18 NOTE — Telephone Encounter (Signed)
New Problem:    Patient called in needing a refill of her losartan (COZAAR) 25 MG tablet.

## 2012-12-27 ENCOUNTER — Other Ambulatory Visit: Payer: Self-pay | Admitting: Cardiology

## 2012-12-27 DIAGNOSIS — R002 Palpitations: Secondary | ICD-10-CM

## 2012-12-27 DIAGNOSIS — I428 Other cardiomyopathies: Secondary | ICD-10-CM

## 2012-12-27 DIAGNOSIS — I5032 Chronic diastolic (congestive) heart failure: Secondary | ICD-10-CM

## 2012-12-27 MED ORDER — LOSARTAN POTASSIUM 25 MG PO TABS: 25.0000 mg | ORAL_TABLET | Freq: Every day | ORAL | Status: DC

## 2012-12-28 ENCOUNTER — Telehealth: Payer: Self-pay | Admitting: *Deleted

## 2012-12-28 DIAGNOSIS — I5032 Chronic diastolic (congestive) heart failure: Secondary | ICD-10-CM

## 2012-12-28 DIAGNOSIS — I428 Other cardiomyopathies: Secondary | ICD-10-CM

## 2012-12-28 DIAGNOSIS — R002 Palpitations: Secondary | ICD-10-CM

## 2012-12-28 MED ORDER — LOSARTAN POTASSIUM 25 MG PO TABS: 25.0000 mg | ORAL_TABLET | Freq: Every day | ORAL | Status: DC

## 2012-12-28 NOTE — Telephone Encounter (Signed)
cb pt to apologize that we have been sending rx, I called [facility] and gave a v/o today to Selena Batten (pharmacist) losartan 25 mg daily # 90 x 3, pharmacist states she will call pt as well and apologize. pt was ok by the end of my call w/her today

## 2013-01-08 ENCOUNTER — Telehealth: Payer: Self-pay | Admitting: *Deleted

## 2013-01-08 NOTE — Telephone Encounter (Signed)
PT AWARE OF MONITOR RESULTS SR PAC'S  NO AFIB PER DR Eden Emms .[name]

## 2013-01-17 ENCOUNTER — Encounter (INDEPENDENT_AMBULATORY_CARE_PROVIDER_SITE_OTHER): Payer: Medicare Other | Admitting: General Surgery

## 2013-01-19 ENCOUNTER — Telehealth: Payer: Self-pay | Admitting: *Deleted

## 2013-01-19 NOTE — Telephone Encounter (Signed)
[name] left a message for the patient to return our call so we can reschedule her med onc appt.

## 2013-01-21 ENCOUNTER — Encounter: Payer: Self-pay | Admitting: Oncology

## 2013-01-21 ENCOUNTER — Telehealth: Payer: Self-pay | Admitting: *Deleted

## 2013-01-21 NOTE — Telephone Encounter (Signed)
Pt called about her appt and I confirmed 02/22/13 appt w/ pt.  Mailed letter & calendar to pt.

## 2013-01-31 ENCOUNTER — Encounter (INDEPENDENT_AMBULATORY_CARE_PROVIDER_SITE_OTHER): Payer: Medicare Other | Admitting: General Surgery

## 2013-02-12 ENCOUNTER — Telehealth: Payer: Self-pay | Admitting: Cardiovascular Disease

## 2013-02-12 ENCOUNTER — Encounter: Payer: Self-pay | Admitting: Cardiovascular Disease

## 2013-02-12 ENCOUNTER — Encounter (INDEPENDENT_AMBULATORY_CARE_PROVIDER_SITE_OTHER): Payer: Medicare Other

## 2013-02-12 ENCOUNTER — Ambulatory Visit (INDEPENDENT_AMBULATORY_CARE_PROVIDER_SITE_OTHER): Payer: Medicare Other | Admitting: Cardiovascular Disease

## 2013-02-12 VITALS — BP 124/72 | HR 72 | Ht 62.0 in | Wt 140.0 lb

## 2013-02-12 DIAGNOSIS — I6522 Occlusion and stenosis of left carotid artery: Secondary | ICD-10-CM

## 2013-02-12 DIAGNOSIS — I6529 Occlusion and stenosis of unspecified carotid artery: Secondary | ICD-10-CM | POA: Diagnosis not present

## 2013-02-12 DIAGNOSIS — I429 Cardiomyopathy, unspecified: Secondary | ICD-10-CM

## 2013-02-12 DIAGNOSIS — I4891 Unspecified atrial fibrillation: Secondary | ICD-10-CM

## 2013-02-12 DIAGNOSIS — I6523 Occlusion and stenosis of bilateral carotid arteries: Secondary | ICD-10-CM

## 2013-02-12 MED ORDER — CARVEDILOL 12.5 MG PO TABS: 12.5000 mg | ORAL_TABLET | Freq: Two times a day (BID) | ORAL | Status: DC

## 2013-02-12 MED ORDER — CARVEDILOL 6.25 MG PO TABS: 6.2500 mg | ORAL_TABLET | Freq: Two times a day (BID) | ORAL | Status: DC

## 2013-02-12 NOTE — Assessment & Plan Note (Addendum)
No change in bruit stable velocities  F/U duplex in August ASA

## 2013-02-12 NOTE — Assessment & Plan Note (Signed)
Euvolemic continue current meds  Reasses EF with MRI or echo in a year

## 2013-02-12 NOTE — Assessment & Plan Note (Signed)
Maint NSR continue flecainide.  She cuts pill in half and has to cut coreg in half.  Will write two separate scripts for coreg so she wont have to take 1.5 pills

## 2013-02-12 NOTE — Progress Notes (Signed)
Patient ID: [name], female   DOB: 14-May-1946, 67 y.o.   MRN: [id number] She has a hx of nonischemic cardiomyopathy, HTN, LBBB, hypothyroidism, Hodgkin's disease, breast cancer and AFib. She underwent radiation therapy for Hodgkin's lymphoma in 1988. She has a hx of breast cancer status post mastectomy in 2009 and recurrent breast cancer in 2013. She has completed radiation, Taxotere and Cytoxan in 7/13. Patient was seen by cardiology during admission in 5/13 for neutropenia and fever as well as acute on chronic diastolic CHF. She had tachycardia. It was suspected that her sinus tachycardia was driven by her fever.  Denies chest discomfort. She denies orthopnea or PND or edema. She denies exertional chest pain or shortness of breath. She denies any increased caffeine intake.  More palpitations with documented PAF. Discussed need for antiarrythmic and proarrythmia. Think flecainide is best choice since no documented CAD and normal EF  Echo 11/13  Study Conclusions  - Left ventricle: Systolic function was normal. The estimated ejection fraction was in the range of 60% to 65%. Doppler parameters are consistent with abnormal left ventricular relaxation (grade 1 diastolic dysfunction). - Aortic valve: Trivial regurgitation. - Mitral valve: Mild regurgitation.   60-79% LICA needs reviewed duplex from today and stable.  Needs f/u in 8/14  ROS: Denies fever, malais, weight loss, blurry vision, decreased visual acuity, cough, sputum, SOB, hemoptysis, pleuritic pain, palpitaitons, heartburn, abdominal pain, melena, lower extremity edema, claudication, or rash.  All other systems reviewed and negative  General: Affect appropriate Healthy:  appears stated age HEENT: normal Neck supple with no adenopathy JVP normal left carotid bruits no thyromegaly Lungs clear with no wheezing and good diaphragmatic motion Heart:  S1/S2 no murmur, no rub, gallop or click PMI normal Abdomen: benighn, BS positve,  no tenderness, no AAA no bruit.  No HSM or HJR Distal pulses intact with no bruits No edema Neuro non-focal Skin warm and dry No muscular weakness   Current Outpatient Prescriptions  Medication Sig Dispense Refill  . calcium carbonate (OS-CAL) 600 MG TABS Take 600 mg by mouth 2 (two) times daily with a meal.       . carvedilol (COREG) 12.5 MG tablet Take 1.5 tablets (18.75 mg total) by mouth 2 (two) times daily with a meal.  90 tablet  3  . cholecalciferol (VITAMIN D) 1000 UNITS tablet Take 2,000 Units by mouth daily.      . flecainide (TAMBOCOR) 100 MG tablet Take 0.5 tablets (50 mg total) by mouth 2 (two) times daily.  90 tablet  3  . levothyroxine (SYNTHROID, LEVOTHROID) 125 MCG tablet Take 125 mcg by mouth daily before breakfast.       . losartan (COZAAR) 25 MG tablet Take 1 tablet (25 mg total) by mouth daily.  30 tablet  12  . magnesium oxide (MAG-OX 400) 400 MG tablet Take 400 mg by mouth 2 (two) times daily.       . Multiple Vitamin (MULTIVITAMIN) tablet Take 1 tablet by mouth daily. Natural multivitamin      . OVER THE COUNTER MEDICATION Take 1 tablet by mouth 2 (two) times daily. osteodenx supplement      . OVER THE COUNTER MEDICATION Take 3 tablets by mouth daily at 12 noon. Vegetarian omega green supplement      . OVER THE COUNTER MEDICATION Take 2 capsules by mouth every morning. mental clarity supplement       No current facility-administered medications for this visit.    Allergies  Benazepril; Epinephrine; and  Iohexol  Electrocardiogram: 10/26/12  SR rate 72 poor R wave progression lateral T wave changes  IVCD of LBBB type  Assessment and Plan

## 2013-02-12 NOTE — Telephone Encounter (Signed)
PT IS TAKING  18.75 MG DOES NOT WANT TO CUT  PILL IN HALF PHARMACISTS AWARE . NEEDS TO FILL BOTH SCRIPTS/CY

## 2013-02-12 NOTE — Patient Instructions (Addendum)
Your physician wants you to follow-up in:   6 MONTHS WITH  DR NISHAN  CAROTID  SAME  DAY   You will receive a reminder letter in the mail two months in advance. If you don't receive a letter, please call our office to schedule the follow-up appointment. Your physician recommends that you continue on your current medications as directed. Please refer to the Current Medication list given to you today.  

## 2013-02-12 NOTE — Telephone Encounter (Signed)
New Prob    Calling for clarification on coreg. States two scripts were sent in with 2 different strengths. Would like to speak to nurse.

## 2013-02-12 NOTE — Assessment & Plan Note (Signed)
Well controlled.  Continue current medications and low sodium Dash type diet.    

## 2013-02-12 NOTE — Assessment & Plan Note (Signed)
Stable no high grade heart block  Yearly ECG  

## 2013-02-13 ENCOUNTER — Other Ambulatory Visit: Payer: Medicare Other | Admitting: Lab

## 2013-02-19 ENCOUNTER — Telehealth: Payer: Self-pay | Admitting: *Deleted

## 2013-02-19 NOTE — Telephone Encounter (Signed)
Called and spoke with patient she wants to see Dr. Welton Flakes. We will leave her appt. As is.

## 2013-02-22 ENCOUNTER — Telehealth: Payer: Self-pay | Admitting: Oncology

## 2013-02-22 ENCOUNTER — Ambulatory Visit: Payer: Medicare Other | Admitting: Gynecologic Oncology

## 2013-02-22 ENCOUNTER — Ambulatory Visit: Payer: Medicare Other | Admitting: Oncology

## 2013-02-22 NOTE — Telephone Encounter (Signed)
S/w the pt and she is aware not to come in for her appt today due to the md's schedule.

## 2013-02-26 ENCOUNTER — Telehealth: Payer: Self-pay | Admitting: *Deleted

## 2013-02-26 NOTE — Telephone Encounter (Signed)
Received return phone call from patient to reschedule her appt. Confirmed appt. For 03/12/13 with Ernestina Penna.  Then will become Dr.Khan.

## 2013-02-26 NOTE — Telephone Encounter (Signed)
Left message for a return phone call to reschedule her appt.  

## 2013-02-27 ENCOUNTER — Encounter (INDEPENDENT_AMBULATORY_CARE_PROVIDER_SITE_OTHER): Payer: Medicare Other | Admitting: General Surgery

## 2013-02-28 ENCOUNTER — Encounter (INDEPENDENT_AMBULATORY_CARE_PROVIDER_SITE_OTHER): Payer: Self-pay | Admitting: General Surgery

## 2013-02-28 ENCOUNTER — Ambulatory Visit (INDEPENDENT_AMBULATORY_CARE_PROVIDER_SITE_OTHER): Payer: Medicare Other | Admitting: General Surgery

## 2013-02-28 VITALS — BP 118/64 | HR 68 | Temp 97.1°F | Resp 12 | Ht 62.0 in | Wt 140.0 lb

## 2013-02-28 DIAGNOSIS — C50911 Malignant neoplasm of unspecified site of right female breast: Secondary | ICD-10-CM

## 2013-02-28 NOTE — Progress Notes (Signed)
Chief complaint: Followup recurrent cancer right breast   History: Patient returns now approaching 1 year after reexcision of recurrent triple negative invasive cancer of the right breast occurring near her previous mastectomy wound. She is status post total mastectomy and negative sentinel lymph node biopsy for in situ and small area of triple negative invasive carcinoma with original diagnosis of 2009. She did not receive any adjuvant treatment at that time. Following reexcision she  completed her chemotherapy and  Radiation. She reports no problems. Specifically no breast or skin or chest wall lesions, unusual pain, shortness of breath or any other complaints. Her mammogram is due in couple of weeks. She has followup at the cancer Center in the near future.  Exam: BP 118/64  Pulse 68  Temp(Src) 97.1 F (36.2 C) (Temporal)  Resp 12  Ht 5\' 2"  (1.575 m)  Wt 140 lb (63.504 kg)  BMI 25.6 kg/m2  LMP 03/08/1995 General: Appears well Skin: No rash or infection Lymph nodes: No cervical, supraclavicular or axillary nodes palpable Lungs: Clear equal breath sounds bilaterally Breasts: Reconstructed breast on the right. There are no subcutaneous masses or rash or skin lesions with particular attention to the reexcision site. No masses palpable in the left breast. Again no axillary adenopathy.  Assessment and plan: 1 year status post excision of chest wall recurrence of carpal negative breast cancer now status post chemoradiation therapy. No clinical evidence of recurrence. Mammogram did next week. She will return here in 6 months.

## 2013-03-07 ENCOUNTER — Other Ambulatory Visit: Payer: Self-pay | Admitting: Obstetrics and Gynecology

## 2013-03-11 ENCOUNTER — Telehealth: Payer: Self-pay | Admitting: Oncology

## 2013-03-11 ENCOUNTER — Telehealth: Payer: Self-pay | Admitting: *Deleted

## 2013-03-11 NOTE — Telephone Encounter (Signed)
Pt lmonvm to cx 3/25 appt she cannot come that day. Message given to Misty Stanley (PR/KK).

## 2013-03-11 NOTE — Telephone Encounter (Signed)
Pt called requesting to cancel her appt and Melissa gave me the message.  I called pt to get her rescheduled and she stating that she would like to keep it.

## 2013-03-12 ENCOUNTER — Ambulatory Visit (HOSPITAL_BASED_OUTPATIENT_CLINIC_OR_DEPARTMENT_OTHER): Payer: BLUE CROSS/BLUE SHIELD | Admitting: Nurse Practitioner

## 2013-03-12 ENCOUNTER — Encounter: Payer: Self-pay | Admitting: Nurse Practitioner

## 2013-03-12 ENCOUNTER — Telehealth: Payer: Self-pay | Admitting: *Deleted

## 2013-03-12 VITALS — BP 115/63 | HR 84 | Temp 98.4°F | Resp 20 | Ht 62.0 in | Wt 140.2 lb

## 2013-03-12 DIAGNOSIS — C50911 Malignant neoplasm of unspecified site of right female breast: Secondary | ICD-10-CM

## 2013-03-12 DIAGNOSIS — C44599 Other specified malignant neoplasm of skin of other part of trunk: Secondary | ICD-10-CM

## 2013-03-12 DIAGNOSIS — Z171 Estrogen receptor negative status [ER-]: Secondary | ICD-10-CM | POA: Diagnosis not present

## 2013-03-12 NOTE — Telephone Encounter (Signed)
appts made and printed 

## 2013-03-12 NOTE — Progress Notes (Signed)
[facility] OFFICE PROGRESS NOTE  Thora Lance, MD  DIAGNOSIS: treated for Hodgkins disease 1988 stage 2B treated with radiation  Initial biopsy of RIGHT  breast - invasive ductal ca w/ DCIS on 01/22/2008 - triple negative  PAST THERAPY: 1.Mastectomy Apr 29, 2008 for T1b N0, triple negative breast cancer, s/p reconstruction with implant 2.Chest wall recurrence in 2013 with completion 4/4 planned cycles of adjuvant every 3 week Taxotere/Cytoxan given 04/17/2012 through 06/20/2012 3.Radiation to Right chest wall bed 07/10/2012 through 08/13/2012   CURRENT THERAPY: Annual mammograms, physical exam and system review  INTERVAL HISTORY: [name] 67 y.o. female returns for routine follow up of her triple negative RIGHT breast cancer. Overall she has been doing well. She looks good and remains active despite her cardiac comorbidities of cardiomyopathy, atrial fibrillation and known left bundle branch block. She is eager to return to annual visits as she has numerous physician appointments, and is quite vigilant regarding her health issues. I think this is fine.   MEDICAL HISTORY: Past Medical History  Diagnosis Date  . Atrial fibrillation     dx in 2008;  CHADS2-VASc=5  . LBBB (left bundle branch block)   . NICM (nonischemic cardiomyopathy)     a. EF as low as 45%;  b. LHC 5/05:  LM 10%, pLAD 30%, EF 45-50%,   c. Echo 5/13:  EF 60-65%, Gr 1 diast dysfn, MAC;   d.  Echo 11/13:  EF 60-65%, Gr 1 diast dysfn, Tr AI, mild MR  . HTN (hypertension)   . Hypothyroidism     2/2 XRT for Hodgkins Lymphoma  . Raynaud's disease   . Anemia   . Blood transfusion     1968  . Arthritis     fingers   . Hodgkin's disease(201)     stage 2b;  s/p XRT 1988  . Breast cancer 2009    T1N0M0 DCIS right breast  . Breast cancer 2013    a. reocurrence right breast;  b. s/p Taxotere, Cytoxan and XRT  . Status post radiation therapy 1988    mantle and periaortic   . Mucositis 04/29/2012  .  History of radiation therapy 07/10/12-08/13/12    right breast/chest wall  . Carotid stenosis     a. dopplers 8/13:  RICA 0-39%, LICA 60-79% => repeat due in 01/2013    SURGICAL HISTORY:  Past Surgical History  Procedure Laterality Date  . Tonsillectomy and adenoidectomy  1965  . Thoractomy  1968  . Btl  1971  . R vein ligation & stripping  1976  . Spleenectomy  1988    for Hodgkin's disease  . Breast lumpectomy  2009    right, lymph node biopsy  . Mastectomy  2009    right breast  . Polypectomy  1990    D&C (also in 1977 and 1991)   . Dilation and curettage of uterus    . Tissue expander placement      right breast  . Tissue expander  removal w/ replacement of implant    . Mass excision  03/28/2012    Procedure: EXCISION MASS;  Surgeon: Mariella Saa, MD;  Location: [facility];  Service: General;  Laterality: Right;  Excision right chest wall mass  . Portacath placement  04/16/2012    Procedure: INSERTION PORT-A-CATH;  Surgeon: Mariella Saa, MD;  Location: [facility];  Service: General;  Laterality: Left;  Placement of Port-a-Cath, left subclavian  . Port-a-cath removal  10/03/2012  Procedure: REMOVAL PORT-A-CATH;  Surgeon: Mariella Saa, MD;  Location: [facility];  Service: General;  Laterality: N/A;    MEDICATIONS: Current Outpatient Prescriptions  Medication Sig Dispense Refill  . calcium carbonate (OS-CAL) 600 MG TABS Take 600 mg by mouth 2 (two) times daily with a meal.       . carvedilol (COREG) 12.5 MG tablet Take 1 tablet (12.5 mg total) by mouth 2 (two) times daily.  180 tablet  3  . carvedilol (COREG) 6.25 MG tablet Take 1 tablet (6.25 mg total) by mouth 2 (two) times daily.  180 tablet  3  . cholecalciferol (VITAMIN D) 1000 UNITS tablet Take 2,000 Units by mouth daily.      . flecainide (TAMBOCOR) 100 MG tablet Take 0.5 tablets (50 mg total) by mouth 2 (two) times daily.  90 tablet  3  . levothyroxine (SYNTHROID, LEVOTHROID) 125 MCG tablet  Take 125 mcg by mouth daily before breakfast.       . losartan (COZAAR) 25 MG tablet Take 1 tablet (25 mg total) by mouth daily.  30 tablet  12  . magnesium oxide (MAG-OX 400) 400 MG tablet Take 400 mg by mouth 2 (two) times daily.       . Multiple Vitamin (MULTIVITAMIN) tablet Take 1 tablet by mouth daily. Natural multivitamin      . OVER THE COUNTER MEDICATION Take 1 tablet by mouth 2 (two) times daily. osteodenx supplement      . OVER THE COUNTER MEDICATION Take 3 tablets by mouth daily at 12 noon. Vegetarian omega green supplement      . OVER THE COUNTER MEDICATION Take 2 capsules by mouth every morning. mental clarity supplement       No current facility-administered medications for this visit.    ALLERGIES:  is allergic to benazepril; epinephrine; and iohexol.  REVIEW OF SYSTEMS: General: denies unexplained weight loss, fatigue, night sweats, fevers, chills HEENT: denies headaches, blurred vision, dizziness, loss of balance Cardiac: denies chest pain, pressure or palpitations Lungs: denies wheezing, shortness of breath or productive cough Abd: denies nausea, vomiting, constipation, diarrhea, indigestion, blood in stool or urine, dysphagia Extremities: denies numbness, tingling, swelling or pain  The rest of the 14-point review of system was negative.   There were no vitals filed for this visit. Wt Readings from Last 3 Encounters:  02/28/13 140 lb (63.504 kg)  02/12/13 140 lb (63.504 kg)  11/27/12 144 lb 12.8 oz (65.681 kg)   ECOG Performance status: 0  PHYSICAL EXAMINATION: 67 yr old white female who appears her stated age  General:  well-nourished in no acute distress.   Eyes:  no scleral icterus.   ENT:  There were no oropharyngeal lesions.  Neck was without thyromegaly.   Lymphatics:  Negative cervical, supraclavicular, axillary, or inguinal adenopathy.  Respiratory: lungs were clear bilaterally without wheezing or crackles.  Cardiovascular:  Regular rate and rhythm,  S1/S2, with known murmur ausculated.  There was no pedal edema.   Breast: no nipple retraction, no nipple discharge, no unusual masses or thickening, negative for palpable abnormalities, mastectomy surgical scar noted to right breast w/ implant. Breasts examined in both supine sitting and lying flat positions. No palpable lymphadenopathy. GI:  abdomen was soft, flat, nontender, nondistended, without organomegaly.   Musculoskeletal:  no spinal tenderness of palpation of vertebral spine.   Skin exam was without echymosis, petichae.   Neuro exam was nonfocal.  Cranial nerves II- XII grossly intact Patient was able to get on and off  exam table without assistance.  Gait was normal.  Patient was alerted and oriented.  Attention was good.   Language was appropriate.  Mood was normal without depression.  Speech was not pressured.  Thought content was not tangential.     LABORATORY/RADIOLOGY DATA:  Lab Results  Component Value Date   WBC 7.1 09/05/2012   HGB 12.3 09/05/2012   HCT 37.9 09/05/2012   PLT 255 09/05/2012   GLUCOSE 121* 11/07/2012   ALT 25 09/05/2012   AST 24 09/05/2012   NA 141 11/07/2012   K 4.4 11/07/2012   CL 102 11/07/2012   CREATININE 0.7 11/07/2012   BUN 18 11/07/2012   CO2 34* 11/07/2012   TSH 0.40 10/26/2012    Mammogram  done last Thursday at Dr. Donovan Kail at [facility] should have been sent to breast center - results obtained and were negative for any evidence of malignancy.    ASSESSMENT AND PLAN: 1. 67 yr old patient with T1bN0, triple negative breast cancer, s/p mastectomy Apr 29, 2008. She is status post reconstruction with implant. 2. Biopsy recurrent disease on right chest wall on 4/10 /2013 showing metastatic ductal carcinoma - surgically excised 3. Completion 4/4 planned cycles of adjuvant every 3 week Taxotere/Cytoxan given 04/17/2012 through 06/20/2012  4. Radiation to Right chest wall bed 07/10/2012 through 08/13/2012   2014 - Most recent mammogram was  March 07, 2013. She is due again for mammogram March 2015.  She has met Dr. Welton Flakes in the past during a hospitalization; Dr. Welton Flakes is out of the office this afternoon.  [name] will follow up in one year. She has no complaints.   Bobbe Medico, AOCNP, NP-C

## 2013-03-27 ENCOUNTER — Other Ambulatory Visit: Payer: Self-pay | Admitting: *Deleted

## 2013-03-27 MED ORDER — CARVEDILOL 12.5 MG PO TABS: ORAL_TABLET | ORAL | Status: DC

## 2013-03-28 ENCOUNTER — Encounter: Payer: Self-pay | Admitting: Oncology

## 2013-04-16 DIAGNOSIS — H40029 Open angle with borderline findings, high risk, unspecified eye: Secondary | ICD-10-CM | POA: Diagnosis not present

## 2013-04-16 DIAGNOSIS — H2510 Age-related nuclear cataract, unspecified eye: Secondary | ICD-10-CM | POA: Diagnosis not present

## 2013-05-09 ENCOUNTER — Inpatient Hospital Stay (HOSPITAL_COMMUNITY)
Admission: EM | Admit: 2013-05-09 | Discharge: 2013-05-23 | DRG: 207 | Disposition: A | Discharge status: Home Health | Payer: Medicare Other | Attending: Internal Medicine | Admitting: Internal Medicine

## 2013-05-09 ENCOUNTER — Emergency Department (HOSPITAL_COMMUNITY): Payer: Medicare Other

## 2013-05-09 ENCOUNTER — Inpatient Hospital Stay (HOSPITAL_COMMUNITY): Payer: Medicare Other

## 2013-05-09 DIAGNOSIS — C8190 Hodgkin lymphoma, unspecified, unspecified site: Secondary | ICD-10-CM | POA: Diagnosis present

## 2013-05-09 DIAGNOSIS — Z923 Personal history of irradiation: Secondary | ICD-10-CM

## 2013-05-09 DIAGNOSIS — F411 Generalized anxiety disorder: Secondary | ICD-10-CM | POA: Diagnosis present

## 2013-05-09 DIAGNOSIS — I469 Cardiac arrest, cause unspecified: Secondary | ICD-10-CM

## 2013-05-09 DIAGNOSIS — R000 Tachycardia, unspecified: Secondary | ICD-10-CM

## 2013-05-09 DIAGNOSIS — I2510 Atherosclerotic heart disease of native coronary artery without angina pectoris: Secondary | ICD-10-CM | POA: Diagnosis present

## 2013-05-09 DIAGNOSIS — Z7901 Long term (current) use of anticoagulants: Secondary | ICD-10-CM

## 2013-05-09 DIAGNOSIS — K1230 Oral mucositis (ulcerative), unspecified: Secondary | ICD-10-CM

## 2013-05-09 DIAGNOSIS — E872 Acidosis, unspecified: Secondary | ICD-10-CM | POA: Diagnosis present

## 2013-05-09 DIAGNOSIS — J9600 Acute respiratory failure, unspecified whether with hypoxia or hypercapnia: Secondary | ICD-10-CM | POA: Diagnosis not present

## 2013-05-09 DIAGNOSIS — I319 Disease of pericardium, unspecified: Secondary | ICD-10-CM | POA: Diagnosis not present

## 2013-05-09 DIAGNOSIS — R918 Other nonspecific abnormal finding of lung field: Secondary | ICD-10-CM | POA: Diagnosis not present

## 2013-05-09 DIAGNOSIS — N170 Acute kidney failure with tubular necrosis: Secondary | ICD-10-CM | POA: Diagnosis not present

## 2013-05-09 DIAGNOSIS — Z9010 Acquired absence of unspecified breast and nipple: Secondary | ICD-10-CM

## 2013-05-09 DIAGNOSIS — E861 Hypovolemia: Secondary | ICD-10-CM | POA: Diagnosis not present

## 2013-05-09 DIAGNOSIS — I7300 Raynaud's syndrome without gangrene: Secondary | ICD-10-CM | POA: Diagnosis present

## 2013-05-09 DIAGNOSIS — J9819 Other pulmonary collapse: Secondary | ICD-10-CM | POA: Diagnosis not present

## 2013-05-09 DIAGNOSIS — I10 Essential (primary) hypertension: Secondary | ICD-10-CM | POA: Diagnosis not present

## 2013-05-09 DIAGNOSIS — Z8249 Family history of ischemic heart disease and other diseases of the circulatory system: Secondary | ICD-10-CM

## 2013-05-09 DIAGNOSIS — I25810 Atherosclerosis of coronary artery bypass graft(s) without angina pectoris: Secondary | ICD-10-CM | POA: Diagnosis not present

## 2013-05-09 DIAGNOSIS — I428 Other cardiomyopathies: Secondary | ICD-10-CM | POA: Diagnosis present

## 2013-05-09 DIAGNOSIS — J90 Pleural effusion, not elsewhere classified: Secondary | ICD-10-CM | POA: Diagnosis not present

## 2013-05-09 DIAGNOSIS — Z79890 Hormone replacement therapy (postmenopausal): Secondary | ICD-10-CM

## 2013-05-09 DIAGNOSIS — Z853 Personal history of malignant neoplasm of breast: Secondary | ICD-10-CM

## 2013-05-09 DIAGNOSIS — R0902 Hypoxemia: Secondary | ICD-10-CM | POA: Diagnosis present

## 2013-05-09 DIAGNOSIS — I4891 Unspecified atrial fibrillation: Secondary | ICD-10-CM | POA: Diagnosis not present

## 2013-05-09 DIAGNOSIS — E43 Unspecified severe protein-calorie malnutrition: Secondary | ICD-10-CM | POA: Diagnosis present

## 2013-05-09 DIAGNOSIS — I959 Hypotension, unspecified: Secondary | ICD-10-CM | POA: Diagnosis not present

## 2013-05-09 DIAGNOSIS — Z780 Asymptomatic menopausal state: Secondary | ICD-10-CM

## 2013-05-09 DIAGNOSIS — Z4682 Encounter for fitting and adjustment of non-vascular catheter: Secondary | ICD-10-CM | POA: Diagnosis not present

## 2013-05-09 DIAGNOSIS — J189 Pneumonia, unspecified organism: Secondary | ICD-10-CM

## 2013-05-09 DIAGNOSIS — E039 Hypothyroidism, unspecified: Secondary | ICD-10-CM | POA: Diagnosis present

## 2013-05-09 DIAGNOSIS — I6529 Occlusion and stenosis of unspecified carotid artery: Secondary | ICD-10-CM | POA: Diagnosis present

## 2013-05-09 DIAGNOSIS — I447 Left bundle-branch block, unspecified: Secondary | ICD-10-CM

## 2013-05-09 DIAGNOSIS — I679 Cerebrovascular disease, unspecified: Secondary | ICD-10-CM

## 2013-05-09 DIAGNOSIS — IMO0002 Reserved for concepts with insufficient information to code with codable children: Secondary | ICD-10-CM

## 2013-05-09 DIAGNOSIS — R0602 Shortness of breath: Secondary | ICD-10-CM | POA: Diagnosis not present

## 2013-05-09 DIAGNOSIS — C50919 Malignant neoplasm of unspecified site of unspecified female breast: Secondary | ICD-10-CM

## 2013-05-09 DIAGNOSIS — N63 Unspecified lump in unspecified breast: Secondary | ICD-10-CM

## 2013-05-09 DIAGNOSIS — N6310 Unspecified lump in the right breast, unspecified quadrant: Secondary | ICD-10-CM

## 2013-05-09 DIAGNOSIS — I5032 Chronic diastolic (congestive) heart failure: Secondary | ICD-10-CM | POA: Diagnosis present

## 2013-05-09 DIAGNOSIS — E876 Hypokalemia: Secondary | ICD-10-CM | POA: Diagnosis present

## 2013-05-09 DIAGNOSIS — Z79899 Other long term (current) drug therapy: Secondary | ICD-10-CM

## 2013-05-09 DIAGNOSIS — J984 Other disorders of lung: Secondary | ICD-10-CM | POA: Diagnosis not present

## 2013-05-09 DIAGNOSIS — J13 Pneumonia due to Streptococcus pneumoniae: Secondary | ICD-10-CM | POA: Diagnosis present

## 2013-05-09 DIAGNOSIS — R0600 Dyspnea, unspecified: Secondary | ICD-10-CM | POA: Diagnosis present

## 2013-05-09 DIAGNOSIS — J811 Chronic pulmonary edema: Secondary | ICD-10-CM | POA: Diagnosis not present

## 2013-05-09 DIAGNOSIS — G9389 Other specified disorders of brain: Secondary | ICD-10-CM | POA: Diagnosis not present

## 2013-05-09 DIAGNOSIS — G9349 Other encephalopathy: Secondary | ICD-10-CM | POA: Diagnosis present

## 2013-05-09 DIAGNOSIS — J690 Pneumonitis due to inhalation of food and vomit: Secondary | ICD-10-CM | POA: Diagnosis not present

## 2013-05-09 DIAGNOSIS — D382 Neoplasm of uncertain behavior of pleura: Secondary | ICD-10-CM | POA: Diagnosis not present

## 2013-05-09 DIAGNOSIS — I5033 Acute on chronic diastolic (congestive) heart failure: Secondary | ICD-10-CM

## 2013-05-09 DIAGNOSIS — I161 Hypertensive emergency: Secondary | ICD-10-CM

## 2013-05-09 DIAGNOSIS — E871 Hypo-osmolality and hyponatremia: Secondary | ICD-10-CM | POA: Diagnosis present

## 2013-05-09 DIAGNOSIS — I509 Heart failure, unspecified: Secondary | ICD-10-CM | POA: Diagnosis present

## 2013-05-09 DIAGNOSIS — R64 Cachexia: Secondary | ICD-10-CM | POA: Diagnosis present

## 2013-05-09 DIAGNOSIS — R0989 Other specified symptoms and signs involving the circulatory and respiratory systems: Secondary | ICD-10-CM

## 2013-05-09 DIAGNOSIS — I169 Hypertensive crisis, unspecified: Secondary | ICD-10-CM

## 2013-05-09 DIAGNOSIS — D649 Anemia, unspecified: Secondary | ICD-10-CM

## 2013-05-09 DIAGNOSIS — J9690 Respiratory failure, unspecified, unspecified whether with hypoxia or hypercapnia: Secondary | ICD-10-CM

## 2013-05-09 DIAGNOSIS — Z9221 Personal history of antineoplastic chemotherapy: Secondary | ICD-10-CM

## 2013-05-09 DIAGNOSIS — I429 Cardiomyopathy, unspecified: Secondary | ICD-10-CM

## 2013-05-09 LAB — POCT I-STAT 3, ART BLOOD GAS (G3+)
Acid-Base Excess: 3 mmol/L — ABNORMAL HIGH (ref 0.0–2.0)
Bicarbonate: 24.7 mEq/L — ABNORMAL HIGH (ref 20.0–24.0)
O2 Saturation: 90 %
O2 Saturation: 96 %
O2 Saturation: 95 %
Patient temperature: 98.6
Patient temperature: 98.6
Patient temperature: 98.6
Patient temperature: 98.6
TCO2: 27 mmol/L (ref 0–100)
TCO2: 27 mmol/L (ref 0–100)
pCO2 arterial: 63.5 mmHg (ref 35.0–45.0)
pH, Arterial: 7.198 — CL (ref 7.350–7.450)

## 2013-05-09 LAB — CBC WITH DIFFERENTIAL/PLATELET
Basophils Relative: 1 % (ref 0–1)
Hemoglobin: 14.3 g/dL (ref 12.0–15.0)
MCHC: 34.1 g/dL (ref 30.0–36.0)
Monocytes Relative: 6 % (ref 3–12)
Neutro Abs: 5.7 10*3/uL (ref 1.7–7.7)
Neutrophils Relative %: 73 % (ref 43–77)
RBC: 4.5 MIL/uL (ref 3.87–5.11)

## 2013-05-09 LAB — APTT: aPTT: 28 seconds (ref 24–37)

## 2013-05-09 LAB — COMPREHENSIVE METABOLIC PANEL
ALT: 18 U/L (ref 0–35)
AST: 26 U/L (ref 0–37)
Albumin: 3.1 g/dL — ABNORMAL LOW (ref 3.5–5.2)
Calcium: 8.9 mg/dL (ref 8.4–10.5)
Creatinine, Ser: 0.76 mg/dL (ref 0.50–1.10)
Sodium: 131 mEq/L — ABNORMAL LOW (ref 135–145)
Total Protein: 6 g/dL (ref 6.0–8.3)

## 2013-05-09 LAB — URINALYSIS, ROUTINE W REFLEX MICROSCOPIC
Bilirubin Urine: NEGATIVE
Glucose, UA: NEGATIVE mg/dL
Hgb urine dipstick: NEGATIVE
Ketones, ur: NEGATIVE mg/dL
Nitrite: NEGATIVE
pH: 7.5 (ref 5.0–8.0)

## 2013-05-09 LAB — TYPE AND SCREEN
ABO/RH(D): O NEG
Antibody Screen: NEGATIVE

## 2013-05-09 LAB — POCT I-STAT TROPONIN I

## 2013-05-09 LAB — PROCALCITONIN: Procalcitonin: <0.1 ng/mL

## 2013-05-09 MED ORDER — BIOTENE DRY MOUTH MT LIQD
15.0000 mL | Freq: Four times a day (QID) | OROMUCOSAL | Status: DC
  Administered 2013-05-10 – 2013-05-15 (×24): 15 mL via OROMUCOSAL

## 2013-05-09 MED ORDER — ROCURONIUM BROMIDE 50 MG/5ML IV SOLN
INTRAVENOUS | Status: AC
  Filled 2013-05-09: qty 2

## 2013-05-09 MED ORDER — SUCCINYLCHOLINE CHLORIDE 20 MG/ML IJ SOLN
INTRAMUSCULAR | Status: AC
  Filled 2013-05-09: qty 1

## 2013-05-09 MED ORDER — ALBUTEROL SULFATE HFA 108 (90 BASE) MCG/ACT IN AERS: 6.0000 | INHALATION_SPRAY | RESPIRATORY_TRACT | Status: DC | PRN

## 2013-05-09 MED ORDER — VANCOMYCIN HCL IN DEXTROSE 750-5 MG/150ML-% IV SOLN
750.0000 mg | Freq: Two times a day (BID) | INTRAVENOUS | Status: DC
  Administered 2013-05-10 – 2013-05-14 (×9): 750 mg via INTRAVENOUS
  Filled 2013-05-09 (×10): qty 150

## 2013-05-09 MED ORDER — PANTOPRAZOLE SODIUM 40 MG IV SOLR
40.0000 mg | Freq: Every day | INTRAVENOUS | Status: DC
  Administered 2013-05-09: 40 mg via INTRAVENOUS
  Filled 2013-05-09 (×2): qty 40

## 2013-05-09 MED ORDER — CEFTAZIDIME 1 G IJ SOLR
1.0000 g | Freq: Three times a day (TID) | INTRAMUSCULAR | Status: DC
  Filled 2013-05-09 (×2): qty 1

## 2013-05-09 MED ORDER — NITROGLYCERIN IN D5W 200-5 MCG/ML-% IV SOLN
2.0000 ug/min | Freq: Once | INTRAVENOUS | Status: DC
  Administered 2013-05-09: 100 ug/min via INTRAVENOUS

## 2013-05-09 MED ORDER — SODIUM BICARBONATE 8.4 % IV SOLN
INTRAVENOUS | Status: AC | PRN
  Administered 2013-05-09 (×2): 50 meq via INTRAVENOUS

## 2013-05-09 MED ORDER — SODIUM CHLORIDE 0.9 % IV SOLN
25.0000 ug/h | INTRAVENOUS | Status: DC
  Administered 2013-05-09: 50 ug/h via INTRAVENOUS
  Administered 2013-05-09: 100 ug/h via INTRAVENOUS
  Filled 2013-05-09: qty 50

## 2013-05-09 MED ORDER — NOREPINEPHRINE BITARTRATE 1 MG/ML IJ SOLN
2.0000 ug/min | INTRAVENOUS | Status: DC
  Administered 2013-05-09: 20 ug/min via INTRAVENOUS
  Administered 2013-05-10: 8 ug/min via INTRAVENOUS
  Administered 2013-05-10 (×2): 10 ug/min via INTRAVENOUS
  Administered 2013-05-11: 7 ug/min via INTRAVENOUS
  Administered 2013-05-11: 4 ug/min via INTRAVENOUS
  Administered 2013-05-11: 7 ug/min via INTRAVENOUS
  Administered 2013-05-12: 5 ug/min via INTRAVENOUS
  Administered 2013-05-13: 1 ug/min via INTRAVENOUS
  Filled 2013-05-09 (×9): qty 4

## 2013-05-09 MED ORDER — LEVOTHYROXINE SODIUM 125 MCG PO TABS
125.0000 ug | ORAL_TABLET | Freq: Every day | ORAL | Status: DC
  Administered 2013-05-10 – 2013-05-23 (×14): 125 ug via ORAL
  Filled 2013-05-09 (×17): qty 1

## 2013-05-09 MED ORDER — LIDOCAINE HCL (CARDIAC) 20 MG/ML IV SOLN
INTRAVENOUS | Status: AC
  Filled 2013-05-09: qty 5

## 2013-05-09 MED ORDER — FENTANYL CITRATE 0.05 MG/ML IJ SOLN
INTRAMUSCULAR | Status: AC
  Filled 2013-05-09: qty 2

## 2013-05-09 MED ORDER — VANCOMYCIN HCL IN DEXTROSE 1-5 GM/200ML-% IV SOLN: 1000.0000 mg | Freq: Once | INTRAVENOUS | Status: DC

## 2013-05-09 MED ORDER — FENTANYL CITRATE 0.05 MG/ML IJ SOLN
100.0000 ug | Freq: Once | INTRAMUSCULAR | Status: AC
  Administered 2013-05-09: 100 ug via INTRAVENOUS

## 2013-05-09 MED ORDER — CALCIUM CHLORIDE 10 % IV SOLN
INTRAVENOUS | Status: AC | PRN
  Administered 2013-05-09: 1 g via INTRAVENOUS

## 2013-05-09 MED ORDER — LABETALOL HCL 5 MG/ML IV SOLN: 10.0000 mg | Freq: Once | INTRAVENOUS | Status: DC

## 2013-05-09 MED ORDER — CHLORHEXIDINE GLUCONATE 0.12 % MT SOLN
15.0000 mL | Freq: Two times a day (BID) | OROMUCOSAL | Status: DC
  Administered 2013-05-09 – 2013-05-15 (×12): 15 mL via OROMUCOSAL
  Filled 2013-05-09 (×12): qty 15

## 2013-05-09 MED ORDER — NITROGLYCERIN IN D5W 200-5 MCG/ML-% IV SOLN
INTRAVENOUS | Status: AC
  Filled 2013-05-09: qty 250

## 2013-05-09 MED ORDER — SODIUM CHLORIDE 0.9 % IV SOLN
250.0000 mL | INTRAVENOUS | Status: DC | PRN
  Administered 2013-05-11: 16:00:00 via INTRAVENOUS
  Administered 2013-05-14: 10 mL/h via INTRAVENOUS

## 2013-05-09 MED ORDER — SODIUM CHLORIDE 0.9 % IV SOLN: INTRAVENOUS | Status: DC

## 2013-05-09 MED ORDER — FENTANYL BOLUS VIA INFUSION
25.0000 ug | Freq: Four times a day (QID) | INTRAVENOUS | Status: DC | PRN
  Filled 2013-05-09: qty 100

## 2013-05-09 MED ORDER — CISATRACURIUM BESYLATE 10 MG/ML IV SOLN
1.0000 ug/kg/min | INTRAVENOUS | Status: DC
  Filled 2013-05-09: qty 20

## 2013-05-09 MED ORDER — SODIUM CHLORIDE 0.9 % IV SOLN
25.0000 ug/h | INTRAVENOUS | Status: DC
  Administered 2013-05-10 – 2013-05-11 (×2): 50 ug/h via INTRAVENOUS
  Administered 2013-05-12: 75 ug/h via INTRAVENOUS
  Administered 2013-05-13: 50 ug/h via INTRAVENOUS
  Administered 2013-05-14: 75 ug/h via INTRAVENOUS
  Filled 2013-05-09 (×4): qty 50

## 2013-05-09 MED ORDER — SODIUM CHLORIDE 0.9 % IV SOLN
1.0000 mg/h | INTRAVENOUS | Status: DC
  Filled 2013-05-09: qty 10

## 2013-05-09 MED ORDER — EPINEPHRINE HCL 0.1 MG/ML IJ SOSY
PREFILLED_SYRINGE | INTRAMUSCULAR | Status: AC | PRN
  Administered 2013-05-09: 1 via INTRAVENOUS

## 2013-05-09 MED ORDER — FUROSEMIDE 10 MG/ML IJ SOLN
40.0000 mg | Freq: Once | INTRAMUSCULAR | Status: AC
  Administered 2013-05-09: 40 mg via INTRAVENOUS

## 2013-05-09 MED ORDER — MIDAZOLAM BOLUS VIA INFUSION
1.0000 mg | INTRAVENOUS | Status: DC | PRN
  Filled 2013-05-09: qty 2

## 2013-05-09 MED ORDER — MIDAZOLAM HCL 2 MG/2ML IJ SOLN
2.0000 mg | Freq: Once | INTRAMUSCULAR | Status: AC
  Administered 2013-05-09: 2 mg via INTRAVENOUS

## 2013-05-09 MED ORDER — HEPARIN SODIUM (PORCINE) 5000 UNIT/ML IJ SOLN
5000.0000 [IU] | Freq: Three times a day (TID) | INTRAMUSCULAR | Status: DC
  Administered 2013-05-09 – 2013-05-16 (×20): 5000 [IU] via SUBCUTANEOUS
  Filled 2013-05-09 (×23): qty 1

## 2013-05-09 MED ORDER — MIDAZOLAM HCL 2 MG/2ML IJ SOLN
INTRAMUSCULAR | Status: AC
  Filled 2013-05-09: qty 2

## 2013-05-09 MED ORDER — CEFTAZIDIME 1 G IJ SOLR
1.0000 g | Freq: Three times a day (TID) | INTRAMUSCULAR | Status: DC
  Administered 2013-05-10 – 2013-05-14 (×13): 1 g via INTRAVENOUS
  Filled 2013-05-09 (×16): qty 1

## 2013-05-09 MED ORDER — ETOMIDATE 2 MG/ML IV SOLN
0.3000 mg/kg | Freq: Once | INTRAVENOUS | Status: AC
  Administered 2013-05-09: 19.08 mg via INTRAVENOUS

## 2013-05-09 MED ORDER — SODIUM CHLORIDE 0.9 % IV SOLN
2000.0000 mL | Freq: Once | INTRAVENOUS | Status: AC
  Administered 2013-05-09: 2000 mL via INTRAVENOUS

## 2013-05-09 MED ORDER — INSULIN ASPART 100 UNIT/ML ~~LOC~~ SOLN
2.0000 [IU] | SUBCUTANEOUS | Status: DC
  Administered 2013-05-09: 4 [IU] via SUBCUTANEOUS
  Administered 2013-05-10 – 2013-05-12 (×7): 2 [IU] via SUBCUTANEOUS

## 2013-05-09 MED ORDER — ETOMIDATE 2 MG/ML IV SOLN
INTRAVENOUS | Status: AC
  Filled 2013-05-09: qty 20

## 2013-05-09 MED ORDER — FUROSEMIDE 10 MG/ML IJ SOLN
INTRAMUSCULAR | Status: AC
  Filled 2013-05-09: qty 4

## 2013-05-09 MED ORDER — SUCCINYLCHOLINE CHLORIDE 20 MG/ML IJ SOLN
80.0000 mg | Freq: Once | INTRAMUSCULAR | Status: AC
  Administered 2013-05-09: 80 mg via INTRAVENOUS
  Filled 2013-05-09: qty 4

## 2013-05-09 NOTE — ED Notes (Signed)
ED-P and Cardiology called to bedside, pt drop in BP and SpO2. Respiratory at bedside.

## 2013-05-09 NOTE — ED Notes (Addendum)
Hypothermia protocol cancelled by Dr.Feinstein. Pt following commands in CT, able to open eyes and squeeze hands.

## 2013-05-09 NOTE — ED Notes (Signed)
 fentanyl bolus per CCM

## 2013-05-09 NOTE — Code Documentation (Signed)
Family updated as to patient's status by EDP.  

## 2013-05-09 NOTE — H&P (Signed)
PULMONARY  / CRITICAL CARE MEDICINE  Name: [name] MRN: [id number] DOB: 1946/03/17    ADMISSION DATE:  05/09/2013 CONSULTATION DATE:  05/09/13  REFERRING MD :  EDP - Dr. Bebe Shaggy PRIMARY SERVICE: PCCM  CHIEF COMPLAINT:  Hypertensive Crisis, Cardiac Arrest, Pulmonary Edema  BRIEF PATIENT DESCRIPTION: [name] who presented to [facility] ER on 5/22 with hypertensive crisis, pulmonary edema and subsequent cardiac arrest (PEA initial rhythm).   SIGNIFICANT EVENTS / STUDIES:  5/22 - Admit with Hypertensive Crisis, Cardiac Arrest, Pulmonary Edema, aspiration PNA  LINES / TUBES: OETT 5/22>>> R IJ TLC 5/22>>> R Fem 5/22>>>  CULTURES: BCx2 5/22>>> Sputum 5/22>>> UA 5/22>>> U. Strep 5/22>>> U. Legionella 5/22>>>  ANTIBIOTICS: Vanco 5/22>>> Ceftaz 5/22>>>  HISTORY OF PRESENT ILLNESS:  [name], former [employer], with PMH of NICM - EF of 45%, Afib, LBBB, HTN, Hypothyroidism, Hodgkins Dx in 1988 s/p XRT, breast cancer in 2009 with reoccurence in 2013 sp taxotere, cytoxan, XRT who according to husband, had been complaining of several days of shortness of breath, and "feeling like her lungs are filling up", patient called EMS herself and on arrival sats were 40%, pt cyanotic with agonal respirations.  EMS did BVM with multiple episodes of vomiting in route.  On arrival to ER sats were in 70's.  Intubated on arrival.  Was hypertensive and placed on NTG gtt and given lasix, versed / fentanyl.   Became hypotensive with desaturations and rrested in ER with PEA arrest requiring 7 minutes of in house CPR with ROSC (given epi, calcium, bicarb x2).  Pt then became hypertensive again after arrest (220/9=80's) and NTG continued.  On arrival of Critical Care team to ER, patient became hypotensive and NTG stopped.  Levophed started.  PCCM called for admit / potential hypothermia.   PAST MEDICAL HISTORY :  Past Medical History  Diagnosis Date  . Atrial fibrillation     dx in 2008;  CHADS2-VASc=5  . LBBB (left  bundle branch block)   . NICM (nonischemic cardiomyopathy)     a. EF as low as 45%;  b. LHC 5/05:  LM 10%, pLAD 30%, EF 45-50%,   c. Echo 5/13:  EF 60-65%, Gr 1 diast dysfn, MAC;   d.  Echo 11/13:  EF 60-65%, Gr 1 diast dysfn, Tr AI, mild MR  . HTN (hypertension)   . Hypothyroidism     2/2 XRT for Hodgkins Lymphoma  . Raynaud's disease   . Anemia   . Blood transfusion     1968  . Arthritis     fingers   . Hodgkin's disease(201)     stage 2b;  s/p XRT 1988  . Breast cancer 2009    T1N0M0 DCIS right breast  . Breast cancer 2013    a. reocurrence right breast;  b. s/p Taxotere, Cytoxan and XRT  . Status post radiation therapy 1988    mantle and periaortic   . Mucositis 04/29/2012  . History of radiation therapy 07/10/12-08/13/12    right breast/chest wall  . Carotid stenosis     a. dopplers 8/13:  RICA 0-39%, LICA 60-79% => repeat due in 01/2013   Past Surgical History  Procedure Laterality Date  . Tonsillectomy and adenoidectomy  1965  . Thoractomy  1968  . Btl  1971  . R vein ligation & stripping  1976  . Spleenectomy  1988    for Hodgkin's disease  . Breast lumpectomy  2009    right, lymph node biopsy  .  Mastectomy  2009    right breast  . Polypectomy  1990    D&C (also in 1977 and 1991)   . Dilation and curettage of uterus    . Tissue expander placement      right breast  . Tissue expander  removal w/ replacement of implant    . Mass excision  03/28/2012    Procedure: EXCISION MASS;  Surgeon: Mariella Saa, MD;  Location: [facility];  Service: General;  Laterality: Right;  Excision right chest wall mass  . Portacath placement  04/16/2012    Procedure: INSERTION PORT-A-CATH;  Surgeon: Mariella Saa, MD;  Location: [facility];  Service: General;  Laterality: Left;  Placement of Port-a-Cath, left subclavian  . Port-a-cath removal  10/03/2012    Procedure: REMOVAL PORT-A-CATH;  Surgeon: Mariella Saa, MD;  Location: [facility];  Service: General;   Laterality: N/A;   Prior to Admission medications   Medication Sig Start Date End Date Taking? Authorizing Provider  calcium carbonate (OS-CAL) 600 MG TABS Take 600 mg by mouth 2 (two) times daily with a meal.     Historical Provider, MD  carvedilol (COREG) 12.5 MG tablet Take 1.5 tablets (18.75mg  total) by mouth 2 times a day with meals 03/27/13   Wendall Stade, MD  carvedilol (COREG) 6.25 MG tablet Take 1 tablet (6.25 mg total) by mouth 2 (two) times daily. 02/12/13   Wendall Stade, MD  cholecalciferol (VITAMIN D) 1000 UNITS tablet Take 2,000 Units by mouth daily.    Historical Provider, MD  flecainide (TAMBOCOR) 100 MG tablet Take 0.5 tablets (50 mg total) by mouth 2 (two) times daily. 11/28/12   Wendall Stade, MD  levothyroxine (SYNTHROID, LEVOTHROID) 125 MCG tablet Take 125 mcg by mouth daily before breakfast.     Historical Provider, MD  losartan (COZAAR) 25 MG tablet Take 1 tablet (25 mg total) by mouth daily. 12/28/12   Wendall Stade, MD  magnesium oxide (MAG-OX 400) 400 MG tablet Take 400 mg by mouth 2 (two) times daily.     Historical Provider, MD  Multiple Vitamin (MULTIVITAMIN) tablet Take 1 tablet by mouth daily. Natural multivitamin    Historical Provider, MD  OVER THE COUNTER MEDICATION Take 1 tablet by mouth 2 (two) times daily. osteodenx supplement    Historical Provider, MD  OVER THE COUNTER MEDICATION Take 3 tablets by mouth daily at 12 noon. Vegetarian omega green supplement    Historical Provider, MD  OVER THE COUNTER MEDICATION Take 2 capsules by mouth every morning. mental clarity supplement    Historical Provider, MD   Allergies  Allergen Reactions  . Benazepril     cough  . Epinephrine     REACTION: BUZZ  . Iohexol      Desc: SNEEZING, pt states this happened in 1988. Has had iv contrast since without being premedicated and has had no problems. Chest ct 2005- LC 05/09/12     FAMILY HISTORY:  Family History  Problem Relation Age of Onset  . Cancer Neg Hx   .  Hypertension Mother    SOCIAL HISTORY:  reports that she has never smoked. She has never used smokeless tobacco. She reports that she does not drink alcohol or use illicit drugs.  REVIEW OF SYSTEMS:  Unable to complete as pt is on vent.   SUBJECTIVE:   VITAL SIGNS: Pulse Rate:  [70-124] 79 (05/22 1730) Resp:  [16-35] 22 (05/22 1730) BP: (33-253)/(17-121) 72/47 mmHg (05/22 1730) SpO2:  [  74 %-92 %] 89 % (05/22 1730) FiO2 (%):  [100 %] 100 % (05/22 1629) Weight:  [140 lb 3.4 oz (63.6 kg)] 140 lb 3.4 oz (63.6 kg) (05/22 1500)  HEMODYNAMICS:    VENTILATOR SETTINGS: Vent Mode:  [-] PRVC FiO2 (%):  [100 %] 100 % Set Rate:  [16 bmp] 16 bmp Vt Set:  [500 mL] 500 mL PEEP:  [8 cmH20] 8 cmH20 Plateau Pressure:  [27 cmH20] 27 cmH20  INTAKE / OUTPUT: Intake/Output   None     PHYSICAL EXAMINATION: General:  Chronically ill on vent Neuro:  Sedate / altered, minimal response to pain, did cough HEENT:  OETT, mm pink/moist Cardiovascular:  s1s2 rrr, no m/r/g Lungs:  resp's even/non-labored, lungs bilaterally coarse R>L Abdomen:  Round/soft Musculoskeletal:  No acute deformities Skin:  Cool /dry  LABS:  Recent Labs Lab 05/09/13 1648 05/09/13 1712  HGB 14.3  --   WBC 7.8  --   PLT 274  --   APTT 28  --   INR 1.01  --   PHART  --  7.198*  PCO2ART  --  63.5*  PO2ART  --  59.0*   No results found for this basename: GLUCAP,  in the last 168 hours  CXR:  R>L airspace disease, edema  ASSESSMENT / PLAN:  PULMONARY A:  Acute Respiratory Failure - in setting of cardiac arrest Pulmonary Edema - in setting of hypertensive crisis Aspiration PNA - presumed aspiration on R At risk ARDS, currently rt greater, need cvp  P:   -full vent support, f/u abg in one hour -may need further peep increase to recruit, await repeat abg -increase rate 26 for now, if cooled will need less MV -f/u cxr in am  -see ID section -may need to alter minute ventilation if cooled, see above -PRN  albuterol -keep plat less 30, likely will need ards protocol  CARDIOVASCULAR A:  Hypertensive Emergency - initial pressure 190/80 Cardiac Arrest - PEA initial rhythm, required 7 min CPR in ER Known LBBB ICM Afib - hx of   P:  -once head bleed ruled out, hypothermia protocol -ASA once above ruled out -goal MAP 85 if cooled / otherwise 75 (25% reduction highest MAp on admission) -levohped for above MAP goals -f/u EKG in am -cycle cardiac enzymes  -cards on board, home flecainide?  RENAL A:   High Risk ATN - in setting of hypotension / arrest  P:   -assess labs now Avoid free water brain edema concern  GASTROINTESTINAL A:  vomiting SUP   P:   -PPI -OGT -NPO, consider early feeding kub  HEMATOLOGIC / ONC A:   Hx of Breast CA / Hodgkins - s/p last Rx in 2013 Immune suppression? P:  -assess labs now scd for now, if neg head ct then sub q hep  INFECTIOUS A:   Aspiration PNA - presumed with multiple episodes of vomiting en route on infiltrate on R  P:   -pan culture -abx as above, ceftaz, vanc stat -unclear immune suppression, sounds like no recent chemo. What better histroy from husband  ENDOCRINE A:   Hyperglycemia  - in setting of arrest Hypothyroidisim  P:   -SSI  -continue synthroid, tsh  NEUROLOGIC A:   Acute Encephalopathy - in setting of arrest  P:   -stat CT head to r/o ICH STAT in setting HTN -fent drip now If cool, (head neg) then add versed, paralysis to goal 32-35  I have personally obtained a history, examined the patient,  evaluated laboratory and imaging results, formulated the assessment and plan and placed orders.  CRITICAL CARE: The patient is critically ill with multiple organ systems failure and requires high complexity decision making for assessment and support, frequent evaluation and titration of therapies, application of advanced monitoring technologies and extensive interpretation of multiple databases. Critical Care Time  devoted to patient care services described in this note is 90 minutes.   Mcarthur Rossetti. Tyson Alias, MD, FACP Pgr: [phone] [facility]  Pulmonary and Critical Care Medicine [facility] Pager: [phone]  05/09/2013, 5:46 PM

## 2013-05-09 NOTE — ED Notes (Signed)
Pt BIB EMS with c/o SOB, pt cyanotic and agonal resp with SpO2 in 40's on EMS arrival. STEMI on 12 lead. Pt vomiting on ED arrival. No IV access. BVM assist resp. With SpO2 in 80's.

## 2013-05-09 NOTE — Code Documentation (Signed)
Pulse returned

## 2013-05-09 NOTE — ED Provider Notes (Signed)
Patient seen/examined in the Emergency Department in conjunction with Resident Physician Provider [facility] Patient presents with shortness of breath Exam : pt is in extremis, hypoxic, vomiting and in respiratory distres Plan: patient intubated Seen by cardiology dr harding, he does not feel this is STEMI Pt likely in acute pulmonary edema She has been given lasix/NTG Will follow closely   PROCEDURE NOTE - INTRAOSSEOUS LINE PLACED BY MYSELF IN RIGHT TIBIAL TUBEROSITY LOCATION FOR EMERGENT VASCULAR ACCESS BLOOD WAS ASPIRATED AND IT FLUSHED FREELY WITH NORMAL SALINE PT TOLERATED WELL PROCEDURE WAS DONE EMERGENTLY WITHOUT CONSENT   Joya Gaskins, MD 05/09/13 1646

## 2013-05-09 NOTE — ED Notes (Signed)
[facility] at bedside. 

## 2013-05-09 NOTE — ED Notes (Signed)
Verbal order per Dr.Feinstein to use [employer]

## 2013-05-09 NOTE — Progress Notes (Signed)
History and Physical  Patient ID: [name] MRN: [id number], SOB: September 12, 1946 67 y.o. Date of Encounter: 05/09/2013, 4:57 PM  Primary Physician: Thora Lance, MD Primary Cardiologist: Dr. Eden Emms  Chief Complaint: Hypoxia  HPI: 67 y.o. female w/ PMHx significant for CHF (NICM, EF 60-65%, g1dd), LBBB (since 2004), HTN, h/o breast cancer s/p chemo/rad, afib, carotid stenosis (L ICA 60-79%)  who presented to [facility] on 05/09/2013 with complaints of dypnea.  The patient called EMS today for dyspnea.  When EMS arrived, the patient was cyanotic, mottled, and dyspneic, with O2 sats in the 40's.  With supplemental O2 and bag valve mask, the patient's sats increased to the 80's.  En route to the hospital, the patient vomited once, with suspicion for aspiration.  In the ED, the patient's sats remained in the 70's despite BVM, and the patient was intubated, with improvement in O2 sats.  The patient's BP was as high as the SBP 200's on arrival, and CXR shows significant edema.  The patient was initially called as a code STEMI, but upon further review, EKG changes including LBBB were seen on a prior EKG.  The patient was started on a nitro drip for HTN emergency.   Past Medical History  Diagnosis Date  . Atrial fibrillation     dx in 2008;  CHADS2-VASc=5  . LBBB (left bundle branch block)   . NICM (nonischemic cardiomyopathy)     a. EF as low as 45%;  b. LHC 5/05:  LM 10%, pLAD 30%, EF 45-50%,   c. Echo 5/13:  EF 60-65%, Gr 1 diast dysfn, MAC;   d.  Echo 11/13:  EF 60-65%, Gr 1 diast dysfn, Tr AI, mild MR  . HTN (hypertension)   . Hypothyroidism     2/2 XRT for Hodgkins Lymphoma  . Raynaud's disease   . Anemia   . Blood transfusion     1968  . Arthritis     fingers   . Hodgkin's disease(201)     stage 2b;  s/p XRT 1988  . Breast cancer 2009    T1N0M0 DCIS right breast  . Breast cancer 2013    a. reocurrence right breast;  b. s/p Taxotere, Cytoxan and XRT  . Status post  radiation therapy 1988    mantle and periaortic   . Mucositis 04/29/2012  . History of radiation therapy 07/10/12-08/13/12    right breast/chest wall  . Carotid stenosis     a. dopplers 8/13:  RICA 0-39%, LICA 60-79% => repeat due in 01/2013     Surgical History:  Past Surgical History  Procedure Laterality Date  . Tonsillectomy and adenoidectomy  1965  . Thoractomy  1968  . Btl  1971  . R vein ligation & stripping  1976  . Spleenectomy  1988    for Hodgkin's disease  . Breast lumpectomy  2009    right, lymph node biopsy  . Mastectomy  2009    right breast  . Polypectomy  1990    D&C (also in 1977 and 1991)   . Dilation and curettage of uterus    . Tissue expander placement      right breast  . Tissue expander  removal w/ replacement of implant    . Mass excision  03/28/2012    Procedure: EXCISION MASS;  Surgeon: Mariella Saa, MD;  Location: [facility];  Service: General;  Laterality: Right;  Excision right chest wall mass  . Portacath placement  04/16/2012  Procedure: INSERTION PORT-A-CATH;  Surgeon: Mariella Saa, MD;  Location: [facility];  Service: General;  Laterality: Left;  Placement of Port-a-Cath, left subclavian  . Port-a-cath removal  10/03/2012    Procedure: REMOVAL PORT-A-CATH;  Surgeon: Mariella Saa, MD;  Location: [facility];  Service: General;  Laterality: N/A;     Home Meds: Prior to Admission medications   Medication Sig Start Date End Date Taking? Authorizing Provider  calcium carbonate (OS-CAL) 600 MG TABS Take 600 mg by mouth 2 (two) times daily with a meal.     Historical Provider, MD  carvedilol (COREG) 12.5 MG tablet Take 1.5 tablets (18.75mg  total) by mouth 2 times a day with meals 03/27/13   Wendall Stade, MD  carvedilol (COREG) 6.25 MG tablet Take 1 tablet (6.25 mg total) by mouth 2 (two) times daily. 02/12/13   Wendall Stade, MD  cholecalciferol (VITAMIN D) 1000 UNITS tablet Take 2,000 Units by mouth daily.    Historical  Provider, MD  flecainide (TAMBOCOR) 100 MG tablet Take 0.5 tablets (50 mg total) by mouth 2 (two) times daily. 11/28/12   Wendall Stade, MD  levothyroxine (SYNTHROID, LEVOTHROID) 125 MCG tablet Take 125 mcg by mouth daily before breakfast.     Historical Provider, MD  losartan (COZAAR) 25 MG tablet Take 1 tablet (25 mg total) by mouth daily. 12/28/12   Wendall Stade, MD  magnesium oxide (MAG-OX 400) 400 MG tablet Take 400 mg by mouth 2 (two) times daily.     Historical Provider, MD  Multiple Vitamin (MULTIVITAMIN) tablet Take 1 tablet by mouth daily. Natural multivitamin    Historical Provider, MD  OVER THE COUNTER MEDICATION Take 1 tablet by mouth 2 (two) times daily. osteodenx supplement    Historical Provider, MD  OVER THE COUNTER MEDICATION Take 3 tablets by mouth daily at 12 noon. Vegetarian omega green supplement    Historical Provider, MD  OVER THE COUNTER MEDICATION Take 2 capsules by mouth every morning. mental clarity supplement    Historical Provider, MD    Allergies:  Allergies  Allergen Reactions  . Benazepril     cough  . Epinephrine     REACTION: BUZZ  . Iohexol      Desc: SNEEZING, pt states this happened in 1988. Has had iv contrast since without being premedicated and has had no problems. Chest ct 2005- LC 05/09/12     History   Social History  . Marital Status: Married    Spouse Name: Donnie    Number of Children: N/A  . Years of Education: N/A   Occupational History  . [employer]    Social History Main Topics  . Smoking status: Never Smoker   . Smokeless tobacco: Never Used  . Alcohol Use: No  . Drug Use: No  . Sexually Active: Not Currently     Comment: post menopausal, HRT x 8 yrs   Other Topics Concern  . Not on file   Social History Narrative   Regular exercise. Retired and married.      Family History  Problem Relation Age of Onset  . Cancer Neg Hx   . Hypertension Mother     Review of Systems: Level 5 caveat applies, patient  intubated  Physical Exam: Weight 140 lb 3.4 oz (63.6 kg), last menstrual period 03/08/1995. General: lying on bed, intubated, minimally responsive HEENT: Normocephalic, atraumatic, sclera non-icteric, no xanthomas, nares are without discharge.  Neck: Supple. Elevated JVP Lungs: Course ronchi heard bilaterally Heart: difficult  to auscultate due to overlying breath sounds, RRR Abdomen: Soft, non-tender, non-distended with normoactive bowel sounds. No hepatomegaly. No rebound/guarding. No obvious abdominal masses. Back: No CVA tenderness Msk:  Strength and tone appear normal for age. Extremities: No clubbing, cyanosis, or edema.   Neuro: minimally responsive   Labs:   Lab Results  Component Value Date   WBC 7.1 09/05/2012   HGB 12.3 09/05/2012   HCT 37.9 09/05/2012   MCV 94.4 09/05/2012   PLT 255 09/05/2012   No results found for this basename: NA, K, CL, CO2, BUN, CREATININE, CALCIUM, LABALBU, PROT, BILITOT, ALKPHOS, ALT, AST, GLUCOSE,  in the last 168 hours No results found for this basename: CKTOTAL, CKMB, TROPONINI,  in the last 72 hours No results found for this basename: CHOL, HDL, LDLCALC, TRIG   No results found for this basename: DDIMER    Radiology/Studies:  CXR: Significant bilateral pulmonary edema.  Also right-sided opacity concerning for aspiration pneumonia.  Echocardiogram 10/31/12  - Left ventricle: Systolic function was normal. The estimated ejection fraction was in the range of 60% to 65%. Doppler parameters are consistent with abnormal left ventricular relaxation (grade 1 diastolic dysfunction). - Aortic valve: Trivial regurgitation. - Mitral valve: Mild regurgitation.   EKG: significant motion artifact, widened QRS, J-point elevation in leads V1-V4, ST depression leads V5, V6, TWI aVL. Borderline peaked T waves  ASSESSMENT AND PLAN:  The patient is a 67 yo woman, history of HTN, NICM, LBBB, afib, presenting with dyspnea and hypoxia, found to be hypertensive  with pulmonary edema.  # Hypertensive emergency - the patient presents with significantly elevated BP's, along with pulmonary edema, likely representing hypertensive emergency.  Nitro drip started in ED. -continue nitro drip, goal BP 160/90 -lasix 40 IV TID -start IV metoprolol  # EKG abnormalities - the patient's EKG shows old changes of LBBB, though with high T waves, and a higher degree of J-point elevation in anterior leads and ST depression in lateral leads than prior.  The patient reportedly noted no pain on EMS arrival (supposedly including chest pain), and is currently non-responsive.  Differential includes hyperkalemia vs hypertensive emergency vs ACS.  The patient had an exercise stress test 11/2012, which was stopped early due to fatigue, but showed no acute abnormalities.  Cardiac cath 04/22/04 showed 30% stenosis of LAD at that time. -cycle troponins -give aspirin, atorvastatin -continue nitro drip for BP -start IV metoprolol  # Acute Respiratory Failure/VDRF - likely due to pulm edema, though now with concern for aspiration pneumonia given witnessed vomiting en route to ED.  Intubated in ED. -cc -abx per CCM  # Atrial fibrillation - the patient is currently in NSR, though has a history of afib with CHADS2-Vasc score of 5. -IV metoprolol for now -resume flecainide when able  # History of breast cancer - s/p chemo, radiation # Carotid Stenosis - 60-79% stenosis of L ICA seen on dopplers 02/12/13  Signed, Janalyn Harder MD 05/09/2013, 4:57 PM  Attending Note:   The patient was seen and examined.  Agree with assessment and plan as noted above.  Changes made to the above note as needed.  Pt presented with severe respiratory distress.  Was intubated for hypoxemia.  She had a PEA arrest - received CPR for 5-10 minutes ( see code note for exact details of the code).  Epi, calcium,  She seemed to recover after receiving bicarb.   Will have the PCCM team admit her.  We will follow  along as consultants.  She  has diastolic dysfunction.  She has had chest radiation and may have restrictive physiology at least partially  due to radiation.   Vesta Mixer, Montez Hageman., MD, [facility] 05/09/2013, 5:34 PM

## 2013-05-09 NOTE — ED Provider Notes (Signed)
History     CSN: [id number]  Arrival date & time 05/09/13  1627   First MD Initiated Contact with Patient 05/09/13 1642      Chief Complaint  Patient presents with  . Code STEMI    (Consider location/radiation/quality/duration/timing/severity/associated sxs/prior treatment) HPI Comments: 67 y.o. female who presents with respiratory failure via EMS. Per EMS, they state when they arrived at pt's home, her O2 sats were in the 40s, and that pt was unable to breath. They bagged pt, and were able to get O2 sats into the 70s. They state they was minimally responsive as well, but moving all 4 extremities when they arrived.   Patient is a 67 y.o. female presenting with general illness. The history is provided by the EMS personnel. The history is limited by the condition of the patient.  Illness Severity:  Severe Onset quality:  Gradual Timing:  Constant Progression:  Worsening Chronicity:  New Associated symptoms: no fever and no rash     Past Medical History  Diagnosis Date  . Atrial fibrillation     dx in 2008;  CHADS2-VASc=5  . LBBB (left bundle branch block)   . NICM (nonischemic cardiomyopathy)     a. EF as low as 45%;  b. LHC 5/05:  LM 10%, pLAD 30%, EF 45-50%,   c. Echo 5/13:  EF 60-65%, Gr 1 diast dysfn, MAC;   d.  Echo 11/13:  EF 60-65%, Gr 1 diast dysfn, Tr AI, mild MR  . HTN (hypertension)   . Hypothyroidism     2/2 XRT for Hodgkins Lymphoma  . Raynaud's disease   . Anemia   . Blood transfusion     1968  . Arthritis     fingers   . Hodgkin's disease(201)     stage 2b;  s/p XRT 1988  . Breast cancer 2009    T1N0M0 DCIS right breast  . Breast cancer 2013    a. reocurrence right breast;  b. s/p Taxotere, Cytoxan and XRT  . Status post radiation therapy 1988    mantle and periaortic   . Mucositis 04/29/2012  . History of radiation therapy 07/10/12-08/13/12    right breast/chest wall  . Carotid stenosis     a. dopplers 8/13:  RICA 0-39%, LICA 60-79% => repeat due in  01/2013    Past Surgical History  Procedure Laterality Date  . Tonsillectomy and adenoidectomy  1965  . Thoractomy  1968  . Btl  1971  . R vein ligation & stripping  1976  . Spleenectomy  1988    for Hodgkin's disease  . Breast lumpectomy  2009    right, lymph node biopsy  . Mastectomy  2009    right breast  . Polypectomy  1990    D&C (also in 1977 and 1991)   . Dilation and curettage of uterus    . Tissue expander placement      right breast  . Tissue expander  removal w/ replacement of implant    . Mass excision  03/28/2012    Procedure: EXCISION MASS;  Surgeon: Mariella Saa, MD;  Location: [facility];  Service: General;  Laterality: Right;  Excision right chest wall mass  . Portacath placement  04/16/2012    Procedure: INSERTION PORT-A-CATH;  Surgeon: Mariella Saa, MD;  Location: [facility];  Service: General;  Laterality: Left;  Placement of Port-a-Cath, left subclavian  . Port-a-cath removal  10/03/2012    Procedure: REMOVAL PORT-A-CATH;  Surgeon: Lorne Skeens  Hoxworth, MD;  Location: [facility];  Service: General;  Laterality: N/A;    Family History  Problem Relation Age of Onset  . Cancer Neg Hx   . Hypertension Mother     History  Substance Use Topics  . Smoking status: Never Smoker   . Smokeless tobacco: Never Used  . Alcohol Use: No    OB History   Grav Para Term Preterm Abortions TAB SAB Ect Mult Living                  Review of Systems  Unable to perform ROS: Acuity of condition  Constitutional: Negative for fever.  Skin: Negative for rash.    Allergies  Benazepril; Epinephrine; and Iohexol  Home Medications   Current Outpatient Rx  Name  Route  Sig  Dispense  Refill  . calcium carbonate (OS-CAL) 600 MG TABS   Oral   Take 600 mg by mouth 2 (two) times daily with a meal.          . carvedilol (COREG) 12.5 MG tablet      Take 1.5 tablets (18.75mg  total) by mouth 2 times a day with meals   180 tablet   3   . carvedilol  (COREG) 6.25 MG tablet   Oral   Take 1 tablet (6.25 mg total) by mouth 2 (two) times daily.   180 tablet   3   . cholecalciferol (VITAMIN D) 1000 UNITS tablet   Oral   Take 2,000 Units by mouth daily.         . flecainide (TAMBOCOR) 100 MG tablet   Oral   Take 0.5 tablets (50 mg total) by mouth 2 (two) times daily.   90 tablet   3   . levothyroxine (SYNTHROID, LEVOTHROID) 125 MCG tablet   Oral   Take 125 mcg by mouth daily before breakfast.          . losartan (COZAAR) 25 MG tablet   Oral   Take 1 tablet (25 mg total) by mouth daily.   30 tablet   12   . magnesium oxide (MAG-OX 400) 400 MG tablet   Oral   Take 400 mg by mouth 2 (two) times daily.          . Multiple Vitamin (MULTIVITAMIN) tablet   Oral   Take 1 tablet by mouth daily. Natural multivitamin         . OVER THE COUNTER MEDICATION   Oral   Take 1 tablet by mouth 2 (two) times daily. osteodenx supplement         . OVER THE COUNTER MEDICATION   Oral   Take 3 tablets by mouth daily at 12 noon. Vegetarian omega green supplement         . OVER THE COUNTER MEDICATION   Oral   Take 2 capsules by mouth every morning. mental clarity supplement           LMP 03/08/1995  Physical Exam  Constitutional: She appears distressed.  HENT:  Head: Normocephalic.  Right Ear: External ear normal.  Left Ear: External ear normal.  Eyes: Pupils are equal, round, and reactive to light. Right eye exhibits no discharge. Left eye exhibits no discharge.  Neck: Normal range of motion. No tracheal deviation present. No thyromegaly present.  Cardiovascular:  tachycarida   Pulmonary/Chest:  Moving poor air, diffuse crackles bilaterally, respiratory failure   Abdominal: She exhibits no distension. There is no tenderness.  Musculoskeletal: She exhibits no edema and no  tenderness.  Neurological: She exhibits normal muscle tone.  Is unable to follow commands, does not open eyes spontaneously, does respond to  painful stimuli   Skin: No rash noted. She is diaphoretic. There is pallor.    ED Course  INTUBATION Date/Time: 05/10/2013 1:15 AM Performed by: Bernadene Person Authorized by: Bernadene Person Consent: The procedure was performed in an emergent situation. Indications: respiratory failure Intubation method: direct Patient status: unconscious Preoxygenation: BVM Sedatives: etomidate Paralytic: succinylcholine Laryngoscope size: Mac 3 Tube size: 7.5 mm Tube type: cuffed Number of attempts: 1 Ventilation between attempts: BVM Cricoid pressure: no Cords visualized: no (grade 3 view) Post-procedure assessment: chest rise,  ETCO2 monitor and CO2 detector Breath sounds: equal and absent over the epigastrium Cuff inflated: yes Tube secured with: ETT holder Chest x-ray interpreted by me. Chest x-ray findings: endotracheal tube in appropriate position Patient tolerance: Patient tolerated the procedure well with no immediate complications. Comments: Mac 3 used, grade 3 view, but pt's arytenoids used to guide view and ET tube. Only one attempt needed to get airway. Difficult airway. Would recommend glidescope in the future for patient or consider Miller blade.    (including critical care time)  Labs Reviewed  URINALYSIS, ROUTINE W REFLEX MICROSCOPIC - Abnormal; Notable for the following:    APPearance CLOUDY (*)    All other components within normal limits  COMPREHENSIVE METABOLIC PANEL - Abnormal; Notable for the following:    Sodium 131 (*)    Chloride 95 (*)    Glucose, Bld 229 (*)    Albumin 3.1 (*)    Alkaline Phosphatase 142 (*)    GFR calc non Af Amer 85 (*)    All other components within normal limits  PRO B NATRIURETIC PEPTIDE - Abnormal; Notable for the following:    Pro B Natriuretic peptide (BNP) 591.0 (*)    All other components within normal limits  GLUCOSE, CAPILLARY - Abnormal; Notable for the following:    Glucose-Capillary 193 (*)    All other components within  normal limits  GLUCOSE, CAPILLARY - Abnormal; Notable for the following:    Glucose-Capillary 106 (*)    All other components within normal limits  POCT I-STAT 3, BLOOD GAS (G3+) - Abnormal; Notable for the following:    pH, Arterial 7.198 (*)    pCO2 arterial 63.5 (*)    pO2, Arterial 59.0 (*)    Bicarbonate 24.7 (*)    Acid-base deficit 5.0 (*)    All other components within normal limits  POCT I-STAT 3, BLOOD GAS (G3+) - Abnormal; Notable for the following:    pO2, Arterial 58.0 (*)    Bicarbonate 25.0 (*)    All other components within normal limits  POCT I-STAT 3, BLOOD GAS (G3+) - Abnormal; Notable for the following:    pH, Arterial 7.533 (*)    pCO2 arterial 28.5 (*)    pO2, Arterial 73.0 (*)    Acid-Base Excess 3.0 (*)    All other components within normal limits  POCT I-STAT 3, BLOOD GAS (G3+) - Abnormal; Notable for the following:    pO2, Arterial 73.0 (*)    Bicarbonate 25.9 (*)    All other components within normal limits  MRSA PCR SCREENING  URINE CULTURE  CULTURE, BLOOD (ROUTINE X 2)  CULTURE, BLOOD (ROUTINE X 2)  CULTURE, RESPIRATORY (NON-EXPECTORATED)  CBC WITH DIFFERENTIAL  PROTIME-INR  APTT  PROCALCITONIN  URINALYSIS, ROUTINE W REFLEX MICROSCOPIC  LEGIONELLA ANTIGEN, URINE  STREP PNEUMONIAE URINARY ANTIGEN  CORTISOL  BLOOD GAS, ARTERIAL  TSH  BLOOD GAS, ARTERIAL  BLOOD GAS, ARTERIAL  TROPONIN I  TROPONIN I  POCT I-STAT TROPONIN I  TYPE AND SCREEN  ABO/RH   Ct Head Wo Contrast  05/09/2013   *RADIOLOGY REPORT*  Clinical Data: Hypertensive crisis, on vent  CT HEAD WITHOUT CONTRAST  Technique:  Contiguous axial images were obtained from the base of the skull through the vertex without contrast.  Comparison: None.  Findings: No evidence of parenchymal hemorrhage or extra-axial fluid collection. No mass lesion, mass effect, or midline shift.  No CT evidence of acute infarction.  Subcortical white matter and periventricular small vessel ischemic changes.   Focal calcification in the subcortical left frontal lobe (series 2/image 20).  Cerebral volume is age appropriate.  No ventriculomegaly.  Partial opacification of the bilateral sphenoid sinuses.  The mastoid air cells are clear.  No evidence of calvarial fracture.  IMPRESSION: No evidence of acute intracranial abnormality.  Mild small vessel ischemic changes.   Original Report Authenticated By: Charline Bills, M.D.   Dg Chest Port 1 View  05/09/2013   *RADIOLOGY REPORT*  Clinical Data: Evaluate ETT, airspace disease  PORTABLE CHEST - 1 VIEW  Comparison: 05/09/2013 at 1639 hours  Findings: Endotracheal tube terminates 3 cm above the carina.  Interval placement of a right IJ venous catheter with its tip at the cavoatrial junction.  No pneumothorax.  Interval placement of an enteric tube coursing into the stomach.  Asymmetric pulmonary opacities, right mid lung predominant, unchanged.  Biapical pleural thickening/fluid.  Heart is normal in size.  Defibrillator pads overlying the left hemithorax.  IMPRESSION: Endotracheal tube terminates 3 cm above the carina.  Interval placement of a right IJ venous catheter with its tip at the cavoatrial junction.  No pneumothorax.  Stable asymmetric pulmonary opacities with biapical pleural thickening/fluid.   Original Report Authenticated By: Charline Bills, M.D.   Dg Chest Portable 1 View  05/09/2013   *RADIOLOGY REPORT*  Clinical Data: Shortness of breath, intubation, history breast cancer, Hodgkin's disease  PORTABLE CHEST - 1 VIEW  Comparison: Portable exam 1639 hours compared to 05/10/2012 Lung apices were excluded but examination not repeated at ED physician request.  Findings: Tip of endotracheal tube 2.2 cm above carina. External pacing leads present. Normal heart size. Asymmetric airspace infiltrates right greater than left new since previous exam. No gross pleural effusion or pneumothorax.  IMPRESSION: Asymmetric bilateral airspace infiltrates right greater than  left, could represent edema, pneumonia, aspiration or alveolar hemorrhage. Satisfactory endotracheal tube position.   Original Report Authenticated By: Ulyses Southward, M.D.   Dg Abd Portable 1v  05/09/2013   *RADIOLOGY REPORT*  Clinical Data: Ileus.  Breast carcinoma.  PORTABLE ABDOMEN - 1 VIEW  Comparison: None.  Findings: No evidence of dilated bowel loops.  Moderate stool noted right colon.  No radiopaque calculi identified. Asymmetric airspace disease noted in the visualized portion of the right lower lung.  IMPRESSION:  1.  Normal bowel gas pattern. 2.  Asymmetric right lung airspace disease.   Original Report Authenticated By: Myles Rosenthal, M.D.     MDM   Pt is not protecting airway, vomitus on mouth, vomits upon arrival, immediately intubated and RSI done. She is pale, diaphoretic, non-responsive -- appears gravely ill.   Pt with signs of flash pulm edema, initially cardiology was consulted for concern of STEMI, however, pt's ECG is similar to before. Chest x-ray shows signs of aspiration, pulm edema, pneumonia -- when pt arrived, she has vomitus on her  shirt, and she vomited upon arrival as well. Pt given lasix and also started on nitro drip -- she was not given nitro bolus, started on drip at 100 and then titrated up due to high MAPs.  Pt had sudden loss of pulses, had PEA arrest -- she was given two rounds of compressions, epi times 2, bicarb and calcium.  Had ROSC. Her blood gas that was taken prior to arrest, that resulted after her arrest shows profound acidosis -- suspect this was one of the main drivers for her arrest.   Family is talked to and they state that pt had been feeling ill for several hours and was having difficulty breathing, and then her husband states she called him, stating "I think I have fluid on my lungs". And then called EMS.   Critical care team consulted and they have admitted pt for further eval and care.   1. Hypertensive emergency   2. Respiratory failure   3.  Cardiac arrest          Bernadene Person, MD 05/10/13 1610

## 2013-05-09 NOTE — Consult Note (Addendum)
ANTIBIOTIC CONSULT NOTE - INITIAL  Pharmacy Consult for Vancomycin Indication: aspiration pneumonia  Allergies  Allergen Reactions  . Benazepril     cough  . Epinephrine     REACTION: BUZZ  . Iohexol      Desc: SNEEZING, pt states this happened in 1988. Has had iv contrast since without being premedicated and has had no problems. Chest ct 2005- LC 05/09/12     Patient Measurements: Height: 5' 2.99" (160 cm) Weight: 140 lb 3.4 oz (63.6 kg) IBW/kg (Calculated) : 52.38  Vital Signs: BP: 72/47 mmHg (05/22 1730) Pulse Rate: 79 (05/22 1730) Intake/Output from previous day:   Intake/Output from this shift:    Labs:  Recent Labs  05/09/13 1648  WBC 7.8  HGB 14.3  PLT 274   Estimated Creatinine Clearance: 61.3 ml/min (by C-G formula based on Cr of 0.7).  Microbiology: No results found for this or any previous visit (from the past 720 hour(s)).  Medical History: Past Medical History  Diagnosis Date  . Atrial fibrillation     dx in 2008;  CHADS2-VASc=5  . LBBB (left bundle branch block)   . NICM (nonischemic cardiomyopathy)     a. EF as low as 45%;  b. LHC 5/05:  LM 10%, pLAD 30%, EF 45-50%,   c. Echo 5/13:  EF 60-65%, Gr 1 diast dysfn, MAC;   d.  Echo 11/13:  EF 60-65%, Gr 1 diast dysfn, Tr AI, mild MR  . HTN (hypertension)   . Hypothyroidism     2/2 XRT for Hodgkins Lymphoma  . Raynaud's disease   . Anemia   . Blood transfusion     1968  . Arthritis     fingers   . Hodgkin's disease(201)     stage 2b;  s/p XRT 1988  . Breast cancer 2009    T1N0M0 DCIS right breast  . Breast cancer 2013    a. reocurrence right breast;  b. s/p Taxotere, Cytoxan and XRT  . Status post radiation therapy 1988    mantle and periaortic   . Mucositis 04/29/2012  . History of radiation therapy 07/10/12-08/13/12    right breast/chest wall  . Carotid stenosis     a. dopplers 8/13:  RICA 0-39%, LICA 60-79% => repeat due in 01/2013   Assessment: 66yof presented to the ED with hypoxia  and vomiting. She was subsequently intubated and then coded PEA arrest with return of pulses after ~ 5-10 minutes. Now on hypothermia protocol and to begin broad spectrum antibiotics for probably aspiration pneumonia.  CMP is currently in process.  Goal of Therapy:  Vancomycin trough level 15-20 mcg/ml  Plan:  1) Follow up CMP and dose vancomycin accordingly.  Fredrik Rigger 05/09/2013,5:50 PM  CMP has resulted. SCr = 0.76 with CrCl ~ 96ml/min.  Plan: 1) Vancomycin 1000mg  IV x 1 then 750mg  IV q12 2) Continue Ceftazidime 1g IV q8 (per MD) 3) Will need to watch renal function/UOP closely on hypothermia protocol and get troughs as necessary

## 2013-05-09 NOTE — ED Notes (Addendum)
Arrival to CCU, BP on arrival 138/87. Bedside report given to [facility], RN.

## 2013-05-09 NOTE — ED Notes (Signed)
Portable CXR for line placement

## 2013-05-09 NOTE — ED Notes (Signed)
To CT with RT and Dr.Feinstein

## 2013-05-09 NOTE — Procedures (Signed)
Arterial Catheter Insertion Procedure Note [name] [id number] 03/25/1946  Procedure: Insertion of Arterial Catheter  Indications: Blood pressure monitoring and Frequent blood sampling  Procedure Details Consent: Unable to obtain consent because of emergent medical necessity. Time Out: Verified patient identification, verified procedure, site/side was marked, verified correct patient position, special equipment/implants available, medications/allergies/relevent history reviewed, required imaging and test results available.  Performed  Maximum sterile technique was used including antiseptics, cap, gloves, gown, hand hygiene, mask and sheet. Skin prep: Chlorhexidine; local anesthetic administered 20 gauge catheter was inserted into right femoral artery using the Seldinger technique.  Evaluation Blood flow good; BP tracing good. Complications: No apparent complications  No radials [place] gudiance.   [name] 05/09/2013

## 2013-05-09 NOTE — ED Notes (Signed)
Cardiology at bedside.

## 2013-05-09 NOTE — Procedures (Signed)
Central Venous Catheter Insertion Procedure Note [name] [id number] 04/09/1946  Procedure: Insertion of Central Venous Catheter Indications: Assessment of intravascular volume, Drug and/or fluid administration and Frequent blood sampling  Procedure Details Consent: Unable to obtain consent because of emergent medical necessity. Time Out: Verified patient identification, verified procedure, site/side was marked, verified correct patient position, special equipment/implants available, medications/allergies/relevent history reviewed, required imaging and test results available.  Performed  Maximum sterile technique was used including antiseptics, cap, gloves, gown, hand hygiene, mask and sheet. Skin prep: Chlorhexidine; local anesthetic administered A antimicrobial bonded/coated triple lumen catheter was placed in the right internal jugular vein using the Seldinger technique.  Evaluation Blood flow good Complications: No apparent complications Patient did tolerate procedure well. Chest X-ray ordered to verify placement.  CXR: pending.  [name] 05/09/2013, 6:27 PM   [name]  [name]. Patricia Alias, MD, FACP Pgr: [phone] [facility]

## 2013-05-09 NOTE — ED Notes (Signed)
Dr.Feinstein started Hypothermia protocol. Consulting civil engineer and pharmacy notified.

## 2013-05-09 NOTE — ED Notes (Signed)
Paged Critical Care to [phone]

## 2013-05-09 NOTE — ED Notes (Signed)
ET tube placed  7.5 24 @ lip secured Verified by ED-P, color change, PCXR, breath sounds

## 2013-05-09 NOTE — ED Notes (Signed)
Dr.Feinstein and Brandy and bedside.

## 2013-05-09 NOTE — Progress Notes (Signed)
Pt responded to Code STEMI page for pt coming by [facility]. While pt being treated in trauma C, code STEMI was cancelled. I met pt's husband in ED waiting and brought him to consult A. 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 Chaplain brought Dr. Donette Larry to update pt's husband. Pt is critical. While doctor is hopeful for improvement, he encouraged pt's husband to call other family members. Chaplain provided emotional and spiritual support to pt's husband and he expressed appreciation for chaplain support.

## 2013-05-10 ENCOUNTER — Inpatient Hospital Stay (HOSPITAL_COMMUNITY): Payer: Medicare Other

## 2013-05-10 ENCOUNTER — Encounter (HOSPITAL_COMMUNITY): Payer: Self-pay | Admitting: *Deleted

## 2013-05-10 DIAGNOSIS — J189 Pneumonia, unspecified organism: Secondary | ICD-10-CM

## 2013-05-10 DIAGNOSIS — J9600 Acute respiratory failure, unspecified whether with hypoxia or hypercapnia: Principal | ICD-10-CM

## 2013-05-10 DIAGNOSIS — I319 Disease of pericardium, unspecified: Secondary | ICD-10-CM

## 2013-05-10 LAB — BASIC METABOLIC PANEL
CO2: 23 mEq/L (ref 19–32)
Calcium: 7.5 mg/dL — ABNORMAL LOW (ref 8.4–10.5)
Chloride: 97 mEq/L (ref 96–112)
Creatinine, Ser: 0.79 mg/dL (ref 0.50–1.10)
GFR calc Af Amer: >90 mL/min (ref >90)
GFR calc Af Amer: 79 mL/min — ABNORMAL LOW (ref >90)
GFR calc non Af Amer: 68 mL/min — ABNORMAL LOW (ref >90)
Sodium: 135 mEq/L (ref 135–145)

## 2013-05-10 LAB — URINE MICROSCOPIC-ADD ON

## 2013-05-10 LAB — CBC
MCV: 92.6 fL (ref 78.0–100.0)
Platelets: 254 10*3/uL (ref 150–400)
RDW: 14 % (ref 11.5–15.5)
WBC: 17.3 10*3/uL — ABNORMAL HIGH (ref 4.0–10.5)

## 2013-05-10 LAB — URINALYSIS, ROUTINE W REFLEX MICROSCOPIC
Glucose, UA: NEGATIVE mg/dL
Specific Gravity, Urine: 1.021 (ref 1.005–1.030)

## 2013-05-10 LAB — PHOSPHORUS
Phosphorus: 4.7 mg/dL — ABNORMAL HIGH (ref 2.3–4.6)
Phosphorus: 3.8 mg/dL (ref 2.3–4.6)

## 2013-05-10 LAB — GLUCOSE, CAPILLARY
Glucose-Capillary: 103 mg/dL — ABNORMAL HIGH (ref 70–99)
Glucose-Capillary: 106 mg/dL — ABNORMAL HIGH (ref 70–99)
Glucose-Capillary: 139 mg/dL — ABNORMAL HIGH (ref 70–99)
Glucose-Capillary: 99 mg/dL (ref 70–99)

## 2013-05-10 LAB — POCT I-STAT 3, ART BLOOD GAS (G3+)
Acid-Base Excess: 2 mmol/L (ref 0.0–2.0)
Patient temperature: 98.2

## 2013-05-10 LAB — MAGNESIUM
Magnesium: 1.7 mg/dL (ref 1.5–2.5)
Magnesium: 1.8 mg/dL (ref 1.5–2.5)

## 2013-05-10 LAB — LEGIONELLA ANTIGEN, URINE

## 2013-05-10 LAB — TROPONIN I: Troponin I: <0.3 ng/mL (ref <0.30)

## 2013-05-10 MED ORDER — POTASSIUM CHLORIDE 10 MEQ/50ML IV SOLN
10.0000 meq | INTRAVENOUS | Status: AC
  Administered 2013-05-10 (×2): 10 meq via INTRAVENOUS
  Filled 2013-05-10: qty 100

## 2013-05-10 MED ORDER — PANTOPRAZOLE SODIUM 40 MG PO PACK
40.0000 mg | PACK | Freq: Every day | ORAL | Status: DC
  Administered 2013-05-10 – 2013-05-14 (×5): 40 mg
  Filled 2013-05-10 (×5): qty 20

## 2013-05-10 MED ORDER — AMIODARONE HCL IN DEXTROSE 360-4.14 MG/200ML-% IV SOLN
30.0000 mg/h | INTRAVENOUS | Status: DC
  Administered 2013-05-11 – 2013-05-12 (×5): 30 mg/h via INTRAVENOUS
  Filled 2013-05-10 (×7): qty 200

## 2013-05-10 MED ORDER — SODIUM CHLORIDE 0.9 % IV BOLUS (SEPSIS)
1000.0000 mL | INTRAVENOUS | Status: DC | PRN
  Administered 2013-05-10: 1000 mL via INTRAVENOUS

## 2013-05-10 MED ORDER — SODIUM CHLORIDE 0.9 % IV BOLUS (SEPSIS): 500.0000 mL | Freq: Once | INTRAVENOUS | Status: DC

## 2013-05-10 MED ORDER — ACETAMINOPHEN 160 MG/5ML PO SOLN
650.0000 mg | ORAL | Status: DC | PRN
  Administered 2013-05-10 – 2013-05-12 (×3): 650 mg
  Filled 2013-05-10 (×4): qty 20.3

## 2013-05-10 MED ORDER — AMIODARONE HCL IN DEXTROSE 360-4.14 MG/200ML-% IV SOLN
60.0000 mg/h | INTRAVENOUS | Status: AC
  Administered 2013-05-10 (×2): 60 mg/h via INTRAVENOUS
  Filled 2013-05-10 (×2): qty 200

## 2013-05-10 MED ORDER — MAGNESIUM SULFATE 40 MG/ML IJ SOLN
2.0000 g | Freq: Once | INTRAMUSCULAR | Status: AC
  Administered 2013-05-10: 2 g via INTRAVENOUS
  Filled 2013-05-10: qty 50

## 2013-05-10 MED ORDER — MAGNESIUM SULFATE IN D5W 10-5 MG/ML-% IV SOLN
1.0000 g | Freq: Once | INTRAVENOUS | Status: AC
  Administered 2013-05-10: 1 g via INTRAVENOUS
  Filled 2013-05-10: qty 100

## 2013-05-10 MED ORDER — AMIODARONE LOAD VIA INFUSION
150.0000 mg | Freq: Once | INTRAVENOUS | Status: AC
  Administered 2013-05-10: 150 mg via INTRAVENOUS
  Filled 2013-05-10: qty 83.34

## 2013-05-10 MED FILL — Medication: Qty: 1 | Status: AC

## 2013-05-10 NOTE — Progress Notes (Signed)
  Amiodarone Drug - Drug Interaction Consult Note  Recommendations:  1-No drug-drug interactions found between current inpatient medications and amiodarone.   2-Reviewed patient's PTA medication list and found drug-drug interactions between flecainide and  amiodarone.  Concurrent use of AMIODARONE and FLECAINIDE may result in flecainide toxicity (cardiac arrhythmias) and/or an increased risk of cardiotoxicity (QT prolongation, torsades de pointes, cardiac arrest).   Amiodarone is metabolized by the cytochrome P450 system and therefore has the potential to cause many drug interactions. Amiodarone has an average plasma half-life of 50 days (range 20 to 100 days).   There is potential for drug interactions to occur several weeks or months after stopping treatment and the onset of drug interactions may be slow after initiating amiodarone.   []  Statins: Increased risk of myopathy. Simvastatin- restrict dose to 20mg  daily. Other statins: counsel patients to report any muscle pain or weakness immediately.  []  Anticoagulants: Amiodarone can increase anticoagulant effect. Consider warfarin dose reduction. Patients should be monitored closely and the dose of anticoagulant altered accordingly, remembering that amiodarone levels take several weeks to stabilize.  []  Antiepileptics: Amiodarone can increase plasma concentration of phenytoin, the dose should be reduced. Note that small changes in phenytoin dose can result in large changes in levels. Monitor patient and counsel on signs of toxicity.  []  Beta blockers: increased risk of bradycardia, AV block and myocardial depression. Sotalol - avoid concomitant use.  []   Calcium channel blockers (diltiazem and verapamil): increased risk of bradycardia, AV block and myocardial depression.  []   Cyclosporine: Amiodarone increases levels of cyclosporine. Reduced dose of cyclosporine is recommended.  []  Digoxin dose should be halved when amiodarone is  started.  []  Diuretics: increased risk of cardiotoxicity if hypokalemia occurs.  []  Oral hypoglycemic agents (glyburide, glipizide, glimepiride): increased risk of hypoglycemia. Patient's glucose levels should be monitored closely when initiating amiodarone therapy.   [x]  Drugs that prolong the QT interval:  Torsades de pointes risk may be increased with concurrent use - avoid if possible.  Monitor QTc, also keep magnesium/potassium WNL if concurrent therapy can't be avoided. Marland Kitchen Antibiotics: e.g. fluoroquinolones, erythromycin. . Antiarrhythmics: e.g. quinidine, procainamide, disopyramide, sotalol. . Antipsychotics: e.g. phenothiazines, haloperidol.  . Lithium, tricyclic antidepressants, and methadone. Thank You,   Noah Delaine, RPh Clinical Pharmacist 05/10/2013 9:08 PM

## 2013-05-10 NOTE — Progress Notes (Signed)
Dr Vassie Loll paged re: low urine output and frequent PVC's pt denies chest pain or any discomfort.

## 2013-05-10 NOTE — Progress Notes (Signed)
UR COMPLETED  

## 2013-05-10 NOTE — Progress Notes (Signed)
Patients O2 sats dropped as low as 84%. Patient asymptomatic. Changed O2 probe, suctioned patient via ETT, pulled patient up in bed with no change in sats. Called Resp to come and assess patient.

## 2013-05-10 NOTE — Progress Notes (Signed)
  Echocardiogram 2D Echocardiogram has been performed.  [name] M 05/10/2013, 10:56 AM

## 2013-05-10 NOTE — Progress Notes (Signed)
[facility] Nursing ICU Electrolyte Replacement Protocol  Patient Name: [name] DOB: [birth year] MRN: [id number]  Date of Service  05/10/2013 K+3.5  Protocol criteria met.  Replace per protocol.  MD notified   HPI/Events of Note    Recent Labs Lab 05/09/13 1648 05/10/13 0455  NA 131* 135  K 4.7 3.5  CL 95* 97  CO2 20 26  GLUCOSE 229* 130*  BUN 23 24*  CREATININE 0.76 0.79  CALCIUM 8.9 8.8  MG  --  1.7  PHOS  --  4.7*    Estimated Creatinine Clearance: 61.3 ml/min (by C-G formula based on Cr of 0.79).  Intake/Output     05/22 0701 - 05/23 0700   I.V. (mL/kg) 378.1 (5.9)   Total Intake(mL/kg) 378.1 (5.9)   Urine (mL/kg/hr) 900   Total Output 900   Net -521.9        - I/O DETAILED x24h    Total I/O In: 305.8 [I.V.:305.8] Out: -  - I/O THIS SHIFT    ASSESSMENT   eICURN Interventions     ASSESSMENT: MAJOR ELECTROLYTE    [name] 05/10/2013, 6:20 AM

## 2013-05-10 NOTE — Progress Notes (Signed)
Fever;   Strep pneumo urine antigen pos; currently on vanc and ceftaz; BC and UC drwan and pending;   Acetaminophen prn.

## 2013-05-10 NOTE — Progress Notes (Signed)
eLink Physician-Brief Progress Note Patient Name: [name] DOB: 08/14/1946 MRN: [id number]  Date of Service  05/10/2013   HPI/Events of Note  A fib rvr 170   eICU Interventions  amio gtt   Intervention Category Major Interventions: Arrhythmia - evaluation and management  [name] 05/10/2013, 8:41 PM

## 2013-05-10 NOTE — Progress Notes (Signed)
Notified MD about increased temp

## 2013-05-10 NOTE — Progress Notes (Signed)
PULMONARY  / CRITICAL CARE MEDICINE  Name: [name] MRN: [id number] DOB: 09-15-1946    ADMISSION DATE:  05/09/2013 CONSULTATION DATE:  05/09/13  REFERRING MD :  EDP - Dr. Bebe Shaggy PRIMARY SERVICE: PCCM  CHIEF COMPLAINT:  Hypertensive Crisis, Cardiac Arrest, Pulmonary Edema  BRIEF PATIENT DESCRIPTION: [name] who presented to [facility] ER on 5/22 with hypertensive crisis, pulmonary edema and subsequent cardiac arrest (PEA initial rhythm).   SIGNIFICANT EVENTS / STUDIES:  5/22 - Admit with Hypertensive Crisis, Cardiac Arrest, Pulmonary Edema, aspiration PNA  LINES / TUBES: OETT 5/22>>> R IJ TLC 5/22>>> R Fem 5/22>>>  CULTURES: BCx2 5/22>>> Sputum 5/22>>> UA 5/22>>> U. Strep 5/22>>>POS U. Legionella 5/22>>>  ANTIBIOTICS: Vanco 5/22>>> Ceftaz 5/22>>>  HISTORY OF PRESENT ILLNESS:  [name], former [employer] fund[employer], with PMH of NICM - EF of 45%, Afib, LBBB, HTN, Hypothyroidism, Hodgkins Dx in 1988 s/p XRT, breast cancer in 2009 with reoccurence in 2013 sp taxotere, cytoxan, XRT who according to husband, had been complaining of several days of shortness of breath, and "feeling like her lungs are filling up", patient called EMS herself and on arrival sats were 40%, pt cyanotic with agonal respirations.  EMS did BVM with multiple episodes of vomiting in route.  On arrival to ER sats were in 70's.  Intubated on arrival.  Was hypertensive and placed on NTG gtt and given lasix, versed / fentanyl.   Became hypotensive with desaturations and rrested in ER with PEA arrest requiring 7 minutes of in house CPR with ROSC (given epi, calcium, bicarb x2).  Pt then became hypertensive again after arrest (220/9=80's) and NTG continued.  On arrival of Critical Care team to ER, patient became hypotensive and NTG stopped.  Levophed started.  PCCM called for admit / potential hypothermia.    SUBJECTIVE: denies pain Remains on levophed -lower doses Wide awake off drips   VITAL SIGNS: Temp:  [94.8 F (34.9 C)-100  F (37.8 C)] 100 F (37.8 C) (05/23 0400) Pulse Rate:  [35-124] 69 (05/23 0827) Resp:  [0-35] 14 (05/23 0827) BP: (33-253)/(17-121) 108/49 mmHg (05/23 0827) SpO2:  [74 %-100 %] 100 % (05/23 0827) Arterial Line BP: (76-176)/(44-96) 99/44 mmHg (05/23 0700) FiO2 (%):  [60 %-100 %] 60 % (05/23 0827) Weight:  [63.6 kg (140 lb 3.4 oz)] 63.6 kg (140 lb 3.4 oz) (05/22 1500)  HEMODYNAMICS: CVP:  [7 mmHg-9 mmHg] 7 mmHg  VENTILATOR SETTINGS: Vent Mode:  [-] PRVC FiO2 (%):  [60 %-100 %] 60 % Set Rate:  [14 bmp-26 bmp] 14 bmp Vt Set:  [500 mL] 500 mL PEEP:  [8 cmH20] 8 cmH20 Plateau Pressure:  [24 cmH20-27 cmH20] 25 cmH20  INTAKE / OUTPUT: Intake/Output     05/22 0701 - 05/23 0700 05/23 0701 - 05/24 0700   I.V. (mL/kg) 478.1 (7.5)    Other 150    IV Piggyback 200    Total Intake(mL/kg) 828.1 (13)    Urine (mL/kg/hr) 900    Total Output 900     Net -71.9            PHYSICAL EXAMINATION: General:  Chronically ill on vent Neuro:  Alert, non focal, power 5/5 all 4Es HEENT:  OETT, mm pink/moist Cardiovascular:  s1s2 rrr, no m/r/g Lungs:  resp's even/non-labored, lungs bilaterally coarse R>L Abdomen:  Round/soft Musculoskeletal:  No acute deformities Skin:  Cool /dry  LABS:  Recent Labs Lab 05/09/13 1648 05/09/13 1651  05/09/13 1823 05/09/13 1954 05/09/13 2000 05/09/13 2109 05/09/13 2200  05/10/13 0440 05/10/13 0455  HGB 14.3  --   --   --   --   --   --   --   --  14.3  WBC 7.8  --   --   --   --   --   --   --   --  17.3*  PLT 274  --   --   --   --   --   --   --   --  254  NA 131*  --   --   --   --   --   --   --   --  135  K 4.7  --   --   --   --   --   --   --   --  3.5  CL 95*  --   --   --   --   --   --   --   --  97  CO2 20  --   --   --   --   --   --   --   --  26  GLUCOSE 229*  --   --   --   --   --   --   --   --  130*  BUN 23  --   --   --   --   --   --   --   --  24*  CREATININE 0.76  --   --   --   --   --   --   --   --  0.79  CALCIUM 8.9  --   --    --   --   --   --   --   --  8.8  MG  --   --   --   --   --   --   --   --   --  1.7  PHOS  --   --   --   --   --   --   --   --   --  4.7*  AST 26  --   --   --   --   --   --   --   --   --   ALT 18  --   --   --   --   --   --   --   --   --   ALKPHOS 142*  --   --   --   --   --   --   --   --   --   BILITOT 0.3  --   --   --   --   --   --   --   --   --   PROT 6.0  --   --   --   --   --   --   --   --   --   ALBUMIN 3.1*  --   --   --   --   --   --   --   --   --   APTT 28  --   --   --   --   --   --   --   --   --   INR 1.01  --   --   --   --   --   --   --   --   --  TROPONINI  --   --   --   --   --   --   --  <0.30  --  <0.30  PROCALCITON  --   --   --   --  <0.10  --   --   --   --  0.52  PROBNP  --  591.0*  --   --   --   --   --   --   --   --   PHART  --   --   < > 7.404  --  7.533* 7.412  --  7.413  --   PCO2ART  --   --   < > 40.0  --  28.5* 40.6  --  43.2  --   PO2ART  --   --   < > 58.0*  --  73.0* 73.0*  --  97.0  --   < > = values in this interval not displayed.  Recent Labs Lab 05/09/13 2018 05/10/13 0007 05/10/13 0434 05/10/13 0755  GLUCAP 193* 106* 90 147*    CXR:  R>L airspace disease, edema vs consolidation  ASSESSMENT / PLAN:  PULMONARY A:  Acute Respiratory Failure - in setting of cardiac arrest Pulmonary Edema - in setting of hypertensive crisis Aspiration PNA/ ALI - presumed aspiration on R, superimposed on ?pneumococcal pna   P:   Lower FIo2 to 60, PEEP to 8, no wean for now, desatn on lower PEEP -PRN albuterol   CARDIOVASCULAR A:  Hypertensive Emergency - initial pressure 190/80 Cardiac Arrest - PEA initial rhythm, required 7 min CPR in ER, no cooling since neuro intact, CEs neg Known LBBB ICM Afib - hx of   P:   -ASA once above ruled out -goal MAP 85 if cooled / otherwise 75 (25% reduction highest MAp on admission) -levohped for above MAP goals -cards on board, home flecainide?  RENAL A:   High Risk ATN - in setting  of hypotension / arrest  P:   -avoid nephrotoxins   GASTROINTESTINAL A:  No issues  P:   -PPI -OGT -NPO, hold off feeding since TFs cause diarrhea per pt   HEMATOLOGIC / ONC A:   Hx of Breast CA / Hodgkins - s/p last Rx in 2013 - chemo RT to chest wall recurrence P:  sub q hep  INFECTIOUS A:   Aspiration PNA - presumed with multiple episodes of vomiting en route on infiltrate on R Pneumococcal pna  P:   -pan culture -abx as above, ceftaz, vanc stat   ENDOCRINE A:   Hyperglycemia  - in setting of arrest Hypothyroidisim -TSH 7 P:   -SSI  -continue synthroid  NEUROLOGIC A:   Acute Encephalopathy - in setting of arrest, resolved, head CT neg  P:   -fent drip now, versed prn   Updated family at bedside  I have personally obtained a history, examined the patient, evaluated laboratory and imaging results, formulated the assessment and plan and placed orders.  CRITICAL CARE: The patient is critically ill with multiple organ systems failure and requires high complexity decision making for assessment and support, frequent evaluation and titration of therapies, application of advanced monitoring technologies and extensive interpretation of multiple databases. Critical Care Time devoted to patient care services described in this note is 45 minutes.   Cyril Mourning MD. Tonny Bollman. [place] [facility] Pager [phone] If no response call [phone]    05/10/2013, 9:08 AM

## 2013-05-10 NOTE — Progress Notes (Signed)
Notified Dr Marchelle Gearing about HR dropping back down to the 80's and rhythm still irregular to see if he still wanted me to go ahead and start the amio, he stated yes.

## 2013-05-10 NOTE — Progress Notes (Signed)
Notified e link nurse about patient converting back to a fib rvr and having several runs of vtach with a hr as high as 152.

## 2013-05-10 NOTE — Progress Notes (Signed)
Subjective:  The patient was admitted yesterday with pulmonary edema, s/p PEA arrest.  She was initially started on a nitro drip for hypertensive emergency, but subsequently became hypotensive, and was started on norepinephrine.  Pressures have been variable overnight, from the SBP 70's to 170's.  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  The patient's CXR this morning shows less pulm edema, and the patient has had good UOP of about 1500 cc's overnight.  Troponins negative overnight.  Objective:  Vital Signs in the last 24 hours: Temp:  [94.8 F (34.9 C)-100 F (37.8 C)] 100 F (37.8 C) (05/23 0400) Pulse Rate:  [35-124] 74 (05/23 0423) Resp:  [0-35] 14 (05/23 0423) BP: (33-253)/(17-121) 106/50 mmHg (05/23 0423) SpO2:  [74 %-100 %] 100 % (05/23 0423) Arterial Line BP: (76-176)/(49-96) 120/58 mmHg (05/23 0400) FiO2 (%):  [100 %] 100 % (05/23 0423) Weight:  [140 lb 3.4 oz (63.6 kg)] 140 lb 3.4 oz (63.6 kg) (05/22 1500)  Intake/Output from previous day: 05/22 0701 - 05/23 0700 In: 378.1 [I.V.:378.1] Out: 900 [Urine:900]  Physical Exam: General: alert, cooperative, lying in bed in NAD HEENT: pupils equal round and reactive to light, vision grossly intact, oropharynx clear and non-erythematous, ETT in place Neck: supple, no lymphadenopathy, no JVD Lungs: mechanical breath sounds, ronchi R > L Heart: regular rate and rhythm Abdomen: soft, non-tender, non-distended, normal bowel sounds Extremities: 1+ bilateral pitting edema Neurologic: alert & oriented X3, cranial nerves II-XII intact, strength grossly intact, sensation intact to light touch  Lab Results:  Recent Labs  05/09/13 1648 05/10/13 0455  WBC 7.8 17.3*  HGB 14.3 14.3  PLT 274 254    Recent Labs  05/09/13 1648 05/10/13 0455  NA 131* 135  K 4.7 3.5  CL 95* 97  CO2 20 26  GLUCOSE 229* 130*  BUN 23 24*  CREATININE  0.76 0.79    Recent Labs  05/09/13 2200 05/10/13 0455  TROPONINI <0.30 <0.30    Cardiac Studies: Echocardiogram 10/31/12 - Left ventricle: Systolic function was normal. The estimated ejection fraction was in the range of 60% to 65%. Doppler parameters are consistent with abnormal left ventricular relaxation (grade 1 diastolic dysfunction). - Aortic valve: Trivial regurgitation. - Mitral valve: Mild regurgitation.  Tele: NSR overnight, mostly in the 70's, with occasional elevations to sinus tachycardia in the 120's  Assessment/Plan:  The patient is a 67 yo woman, history of HTN, NICM, LBBB, afib, presenting with dyspnea and hypoxia, found to be hypertensive with pulmonary edema.   # Acute Respiratory Failure/VDRF - The patient presented with dyspnea and acute respiratory failure, found to have diffuse pulmonary edema as well as a right-sided infiltrate concerning for aspiration pneumonia (witnessed vomiting by EMS).  The patient's pulmonary edema has significantly improved this morning.  -abx per CCM, currently on vanc, ceftaz -consider additional dose of lasix today, as patient still appears hypervolemic  # Hypotension - the patient initially presented with elevated BP and pulmonary edema, thought to represent hypertensive emergency.  The patient was started on a nitro drip, but later had a PEA arrest, followed by hypotension, and the nitro drip was discontinued, and norepi was started.  BP's have been labile overnight -continue norepi, wean as able  # EKG abnormalities - the patient was initially called as a code STEMI, but further review of EKG showed only stable changes and old LBBB.  The patient notes no chest pain.  The patient had an exercise stress test 11/2012, which was stopped early due to fatigue, but showed no acute abnormalities. Cardiac cath 04/22/04 showed 30% stenosis of LAD at that time. -troponins have been negative overnight  # Atrial fibrillation - the patient is  currently in NSR, though has a history of afib with CHADS2-Vasc score of 5.  -holding home metoprolol, given hypotension  # Hypothyroidism - the patient has a history of hypothyroidism -continue synthroid  # Hyponatremia - Resolved  # Hypokalemia/Hypomagnesemia - Repleted  # History of breast cancer - s/p chemo, radiation   # Carotid Stenosis - 60-79% stenosis of L ICA seen on dopplers 02/12/13  Janalyn Harder, M.D. 05/10/2013, 6:47 AM   History and all data above reviewed.  Patient examined.  I agree with the findings as above.  She is wide awake and no complaintsThe patient exam reveals COR:RRR  ,  Lungs: Decreased breath sounds on the left  ,  Abd: Positive bowel sounds, no rebound no guarding, Ext No edema  .  All available labs, radiology testing, previous records reviewed. Agree with documented assessment and plan. Possibly some element of mild diastolic dysfunction.  No acute cardiac issues this am.  Restart flecainide when she is taking PO.  We will follow as needed.   Rollene Rotunda  8:17 AM  05/10/2013

## 2013-05-10 NOTE — Progress Notes (Signed)
INITIAL NUTRITION ASSESSMENT  DOCUMENTATION CODES Per approved criteria  -Not Applicable   INTERVENTION: 1. Recommend initiaiton of EN with in 24-48 hours of intubation.  2. If EN warranted, recommend initiate Vital AF 1.2 at 20 ml/hr and advance by 10 ml q 4 hr to a goal rate of 45 ml/hr. 30 ml Pro-stat once daily. This EN regimen would provide 1396 kcal, 96 gm protein, and 876 ml free water. Vital is an elemental formula designed for best GI tolerance.   NUTRITION DIAGNOSIS: Inadequate oral intake related to inability to eat as evidenced by NPO diet.   Goal: Meet >/=90% estimated nutrition needs.   Monitor:  Vent status, weight trends, initiation of EN, labs   Reason for Assessment: VRDF   67 y.o. female  Admitting Dx: Cardiac Arrest   ASSESSMENT: Pt admitted with hypertensive crisis, pulmonary edema and PEA arrest. Pt was intubated and started on hypothermia protocol initially, but was cancelled. Pt remains intubated at this time, does not want TF because "it causes diarrhea".  Per RN, may extubate tomorrow. If pt is not extubated, recommend start enteral nutrition. Recommend elemental formula to promote GI tolerance. Pt would need OG tube placed prior to starting TF.    Height: Ht Readings from Last 1 Encounters:  05/09/13 5' 2.99" (1.6 m)    Weight: Wt Readings from Last 1 Encounters:  05/09/13 140 lb 3.4 oz (63.6 kg)    Ideal Body Weight: 115 lbs   % Ideal Body Weight: 121%  Wt Readings from Last 10 Encounters:  05/09/13 140 lb 3.4 oz (63.6 kg)  03/12/13 140 lb 3.2 oz (63.594 kg)  02/28/13 140 lb (63.504 kg)  02/12/13 140 lb (63.504 kg)  11/27/12 144 lb 12.8 oz (65.681 kg)  10/26/12 141 lb 6.4 oz (64.139 kg)  09/28/12 147 lb 11.2 oz (66.996 kg)  09/18/12 145 lb (65.772 kg)  09/05/12 144 lb 3.2 oz (65.409 kg)  08/13/12 142 lb 14.4 oz (64.819 kg)    Usual Body Weight: 140 lbs   % Usual Body Weight: 100%  BMI:  Body mass index is 24.84 kg/(m^2). WNL    Patient is currently intubated on ventilator support.  MV: 6.8  Temp:Temp (24hrs), Avg:97.5 F (36.4 C), Min:94.8 F (34.9 C), Max:100 F (37.8 C)  Propofol: none  Estimated Nutritional Needs: Kcal: 1414 Protein: >/= 96 gm  Fluid: >/= 1.4 L   Skin: intact  Diet Order: NPO  EDUCATION NEEDS: -No education needs identified at this time   Intake/Output Summary (Last 24 hours) at 05/10/13 1035 Last data filed at 05/10/13 0700  Gross per 24 hour  Intake 828.11 ml  Output    900 ml  Net -71.89 ml    Last BM: PTA   Labs:   Recent Labs Lab 05/09/13 1648 05/10/13 0455  NA 131* 135  K 4.7 3.5  CL 95* 97  CO2 20 26  BUN 23 24*  CREATININE 0.76 0.79  CALCIUM 8.9 8.8  MG  --  1.7  PHOS  --  4.7*  GLUCOSE 229* 130*    CBG (last 3)   Recent Labs  05/10/13 0007 05/10/13 0434 05/10/13 0755  GLUCAP 106* 90 147*    Scheduled Meds: . antiseptic oral rinse  15 mL Mouth Rinse QID  . cefTAZidime (FORTAZ)  IV  1 g Intravenous Q8H  . chlorhexidine  15 mL Mouth Rinse BID  . heparin subcutaneous  5,000 Units Subcutaneous Q8H  . insulin aspart  2-6 Units Subcutaneous Q4H  .  levothyroxine  125 mcg Oral QAC breakfast  . pantoprazole sodium  40 mg Per Tube Q1200  . vancomycin  1,000 mg Intravenous Once  . vancomycin  750 mg Intravenous Q12H    Continuous Infusions: . sodium chloride    . fentaNYL infusion INTRAVENOUS 50 mcg/hr (05/10/13 0600)  . fentaNYL infusion INTRAVENOUS    . midazolam (VERSED) infusion    . norepinephrine (LEVOPHED) Adult infusion 10 mcg/min (05/10/13 0536)    Past Medical History  Diagnosis Date  . Atrial fibrillation     dx in 2008;  CHADS2-VASc=5  . LBBB (left bundle branch block)   . NICM (nonischemic cardiomyopathy)     a. EF as low as 45%;  b. LHC 5/05:  LM 10%, pLAD 30%, EF 45-50%,   c. Echo 5/13:  EF 60-65%, Gr 1 diast dysfn, MAC;   d.  Echo 11/13:  EF 60-65%, Gr 1 diast dysfn, Tr AI, mild MR  . HTN (hypertension)   .  Hypothyroidism     2/2 XRT for Hodgkins Lymphoma  . Raynaud's disease   . Anemia   . Blood transfusion     1968  . Arthritis     fingers   . Hodgkin's disease(201)     stage 2b;  s/p XRT 1988  . Breast cancer 2009    T1N0M0 DCIS right breast  . Breast cancer 2013    a. reocurrence right breast;  b. s/p Taxotere, Cytoxan and XRT  . Status post radiation therapy 1988    mantle and periaortic   . Mucositis 04/29/2012  . History of radiation therapy 07/10/12-08/13/12    right breast/chest wall  . Carotid stenosis     a. dopplers 8/13:  RICA 0-39%, LICA 60-79% => repeat due in 01/2013    Past Surgical History  Procedure Laterality Date  . Tonsillectomy and adenoidectomy  1965  . Thoractomy  1968  . Btl  1971  . R vein ligation & stripping  1976  . Spleenectomy  1988    for Hodgkin's disease  . Breast lumpectomy  2009    right, lymph node biopsy  . Mastectomy  2009    right breast  . Polypectomy  1990    D&C (also in 1977 and 1991)   . Dilation and curettage of uterus    . Tissue expander placement      right breast  . Tissue expander  removal w/ replacement of implant    . Mass excision  03/28/2012    Procedure: EXCISION MASS;  Surgeon: Mariella Saa, MD;  Location: [facility];  Service: General;  Laterality: Right;  Excision right chest wall mass  . Portacath placement  04/16/2012    Procedure: INSERTION PORT-A-CATH;  Surgeon: Mariella Saa, MD;  Location: [facility];  Service: General;  Laterality: Left;  Placement of Port-a-Cath, left subclavian  . Port-a-cath removal  10/03/2012    Procedure: REMOVAL PORT-A-CATH;  Surgeon: Mariella Saa, MD;  Location: [facility];  Service: General;  Laterality: N/A;    Clarene Duke RD, LDN Pager [phone] After Hours pager [phone]

## 2013-05-10 NOTE — Progress Notes (Signed)
Dr.Alva paged. Pt urine output remains low and unable to wean Levophed.

## 2013-05-10 NOTE — Progress Notes (Signed)
Spoke to Dr. Marchelle Gearing re: pt low urine output and change in cardiac rhythm earlier. Pt rhythm now SR no ectopy pt sleeping. Orders given.

## 2013-05-10 NOTE — Progress Notes (Signed)
eLink Physician Progress Note and Electrolyte Replacement  Patient Name: [name] DOB: Feb 15, 1946 MRN: [id number]  Date of Service  05/10/2013   HPI/Events of Note    Recent Labs Lab 05/09/13 1648 05/10/13 0455 05/10/13 1803  NA 131* 135 135  K 4.7 3.5 3.6  CL 95* 97 101  CO2 20 26 23   GLUCOSE 229* 130* 139*  BUN 23 24* 22  CREATININE 0.76 0.79 0.86  CALCIUM 8.9 8.8 7.5*  MG  --  1.7 1.8  PHOS  --  4.7* 3.8    Estimated Creatinine Clearance: 57 ml/min (by C-G formula based on Cr of 0.86).  Intake/Output     05/23 0701 - 05/24 0700   I.V. (mL/kg) 367 (5.8)   Other    IV Piggyback 250   Total Intake(mL/kg) 617 (9.7)   Urine (mL/kg/hr) 455 (0.6)   Total Output 455   Net +162        - I/O DETAILED x 24h    Total I/O In: 150 [IV Piggyback:150] Out: -  - I/O THIS SHIFT   EKG shows in and out of A Fib   ASSESSMENT A Fib Mild hypomag  eICURN Interventions  Rx mag Monitor; hold off amio   ASSESSMENT: MAJOR ELECTROLYTE      Dr. Kalman Shan, M.D., [facility].C.P Pulmonary and Critical Care Medicine Staff Physician [facility] Pager: [phone], If no answer or between  15:00h - 7:00h: call [phone]  05/10/2013 7:19 PM

## 2013-05-10 NOTE — Progress Notes (Signed)
eLink Physician-Brief Progress Note Patient Name: [name] DOB: March 01, 1946 MRN: [id number]  Date of Service  05/10/2013   HPI/Events of Note   Registered nurse calling healing. Patient tachycardic, hypotensive, CVP is low and urine output is low   eICU Interventions   fluid bolus to get CVP over 10    Intervention Category Major Interventions: Hypovolemia - evaluation and treatment with fluids  Melvyn Hommes 05/10/2013, 3:33 PM

## 2013-05-11 ENCOUNTER — Inpatient Hospital Stay (HOSPITAL_COMMUNITY): Payer: Medicare Other

## 2013-05-11 LAB — POCT I-STAT 3, ART BLOOD GAS (G3+)
Acid-Base Excess: 2 mmol/L (ref 0.0–2.0)
Bicarbonate: 27.2 mEq/L — ABNORMAL HIGH (ref 20.0–24.0)
Patient temperature: 102
pH, Arterial: 7.364 (ref 7.350–7.450)
pO2, Arterial: 84 mmHg (ref 80.0–100.0)

## 2013-05-11 LAB — URINE CULTURE
Colony Count: NO GROWTH
Colony Count: NO GROWTH
Culture: NO GROWTH

## 2013-05-11 LAB — PROCALCITONIN: Procalcitonin: 0.49 ng/mL

## 2013-05-11 LAB — GLUCOSE, CAPILLARY
Glucose-Capillary: 113 mg/dL — ABNORMAL HIGH (ref 70–99)
Glucose-Capillary: 123 mg/dL — ABNORMAL HIGH (ref 70–99)
Glucose-Capillary: 141 mg/dL — ABNORMAL HIGH (ref 70–99)
Glucose-Capillary: 107 mg/dL — ABNORMAL HIGH (ref 70–99)
Glucose-Capillary: 111 mg/dL — ABNORMAL HIGH (ref 70–99)
Glucose-Capillary: 86 mg/dL (ref 70–99)

## 2013-05-11 LAB — BASIC METABOLIC PANEL
Chloride: 97 mEq/L (ref 96–112)
Creatinine, Ser: 0.97 mg/dL (ref 0.50–1.10)
GFR calc Af Amer: 69 mL/min — ABNORMAL LOW (ref >90)
GFR calc non Af Amer: 59 mL/min — ABNORMAL LOW (ref >90)
Potassium: 3.5 mEq/L (ref 3.5–5.1)

## 2013-05-11 LAB — CBC
HCT: 37.2 % (ref 36.0–46.0)
RDW: 14.3 % (ref 11.5–15.5)
WBC: 20.4 10*3/uL — ABNORMAL HIGH (ref 4.0–10.5)

## 2013-05-11 MED ORDER — POTASSIUM CHLORIDE 20 MEQ/15ML (10%) PO LIQD
ORAL | Status: AC
  Filled 2013-05-11: qty 15

## 2013-05-11 MED ORDER — ACETAMINOPHEN 160 MG/5ML PO SOLN
650.0000 mg | Freq: Once | ORAL | Status: AC
  Administered 2013-05-11: 650 mg

## 2013-05-11 MED ORDER — ONDANSETRON HCL 4 MG/2ML IJ SOLN
4.0000 mg | Freq: Four times a day (QID) | INTRAMUSCULAR | Status: DC | PRN
  Administered 2013-05-12 – 2013-05-16 (×3): 4 mg via INTRAVENOUS
  Filled 2013-05-11 (×3): qty 2

## 2013-05-11 MED ORDER — SODIUM CHLORIDE 0.9 % IV SOLN
INTRAVENOUS | Status: DC
  Administered 2013-05-11: 16:00:00 via INTRAVENOUS
  Administered 2013-05-14: 10 mL/h via INTRAVENOUS
  Administered 2013-05-15: 23:00:00 via INTRAVENOUS

## 2013-05-11 MED ORDER — JEVITY 1.2 CAL PO LIQD
1000.0000 mL | ORAL | Status: DC
  Filled 2013-05-11 (×2): qty 1000

## 2013-05-11 MED ORDER — POTASSIUM CHLORIDE 10 MEQ/50ML IV SOLN
10.0000 meq | INTRAVENOUS | Status: AC
  Administered 2013-05-11 (×4): 10 meq via INTRAVENOUS
  Filled 2013-05-11 (×4): qty 50

## 2013-05-11 MED ORDER — POTASSIUM CHLORIDE 20 MEQ/15ML (10%) PO LIQD
40.0000 meq | Freq: Once | ORAL | Status: AC
  Administered 2013-05-11: 40 meq
  Filled 2013-05-11: qty 30

## 2013-05-11 MED ORDER — ONDANSETRON HCL 4 MG/2ML IJ SOLN
INTRAMUSCULAR | Status: AC
  Administered 2013-05-11: 4 mg
  Filled 2013-05-11: qty 2

## 2013-05-11 NOTE — Progress Notes (Signed)
PULMONARY  / CRITICAL CARE MEDICINE  Name: [name] MRN: [id number] DOB: [birth year]    ADMISSION DATE:  05/09/2013 CONSULTATION DATE:  05/09/13  REFERRING MD :  EDP - Dr. Bebe Shaggy PRIMARY SERVICE: PCCM  CHIEF COMPLAINT:  Hypertensive Crisis, Cardiac Arrest, Pulmonary Edema  BRIEF PATIENT DESCRIPTION: [name] who presented to [facility] ER on 5/22 with hypertensive crisis, pulmonary edema and subsequent cardiac arrest (PEA initial rhythm).   SIGNIFICANT EVENTS / STUDIES:  5/22 - Admit with Hypertensive Crisis, Cardiac Arrest, Pulmonary Edema, aspiration PNA  LINES / TUBES: OETT 5/22>>> R IJ TLC 5/22>>> R Fem 5/22>>>  CULTURES: BCx2 5/22>>> Sputum 5/22>>> UA 5/22>>> U. Strep 5/22>>>POS U. Legionella 5/22>>>  ANTIBIOTICS: Vanco 5/22>>> Ceftaz 5/22>>>  HISTORY OF PRESENT ILLNESS:  [name], former [employer], with PMH of NICM - EF of 45%, Afib, LBBB, HTN, Hypothyroidism, Hodgkins Dx in 1988 s/p XRT, breast cancer in 2009 with reoccurence in 2013 sp taxotere, cytoxan, XRT who according to husband, had been complaining of several days of shortness of breath, and "feeling like her lungs are filling up", patient called EMS herself and on arrival sats were 40%, pt cyanotic with agonal respirations.  EMS did BVM with multiple episodes of vomiting in route.  On arrival to ER sats were in 70's.  Intubated on arrival.  Was hypertensive and placed on NTG gtt and given lasix, versed / fentanyl.   Became hypotensive with desaturations and rrested in ER with PEA arrest requiring 7 minutes of in house CPR with ROSC (given epi, calcium, bicarb x2).  Pt then became hypertensive again after arrest (220/9=80's) and NTG continued.  On arrival of Critical Care team to ER, patient became hypotensive and NTG stopped.  Levophed started.  PCCM called for admit / potential hypothermia.    SUBJECTIVE: denies pain levophed  At lower doses Wide awake off drips Remains on 70%/ PEEP 8  VITAL SIGNS: Temp:  [98 F  (36.7 C)-102 F (38.9 C)] 98.6 F (37 C) (05/24 0800) Pulse Rate:  [56-112] 56 (05/24 0900) Resp:  [13-36] 14 (05/24 0900) BP: (63-144)/(27-85) 97/35 mmHg (05/24 0900) SpO2:  [85 %-100 %] 98 % (05/24 0900) Arterial Line BP: (80-155)/(39-101) 117/47 mmHg (05/24 0900) FiO2 (%):  [60 %-80 %] 70 % (05/24 0812)  HEMODYNAMICS: CVP:  [4 mmHg-12 mmHg] 12 mmHg  VENTILATOR SETTINGS: Vent Mode:  [-] PRVC FiO2 (%):  [60 %-80 %] 70 % Set Rate:  [14 bmp] 14 bmp Vt Set:  [500 mL] 500 mL PEEP:  [8 cmH20] 8 cmH20 Plateau Pressure:  [22 cmH20-24 cmH20] 23 cmH20  INTAKE / OUTPUT: Intake/Output     05/23 0701 - 05/24 0700 05/24 0701 - 05/25 0700   I.V. (mL/kg) 1240.6 (19.5) 128.4 (2)   Other     IV Piggyback 550    Total Intake(mL/kg) 1790.6 (28.2) 128.4 (2)   Urine (mL/kg/hr) 795 (0.5)    Total Output 795     Net +995.6 +128.4          PHYSICAL EXAMINATION: General:  acutely ill on vent Neuro:  Alert, non focal, power 5/5 all 4Es HEENT:  OETT, mm pink/moist Cardiovascular:  s1s2 rrr, no m/r/g Lungs:  resp's even/non-labored, lungs bilaterally coarse R>L, BLL crackles Abdomen:  Round/soft Musculoskeletal:  No acute deformities Skin:  Cool /dry  LABS:  Recent Labs Lab 05/09/13 1648 05/09/13 1651  05/09/13 1954  05/09/13 2109 05/09/13 2200 05/10/13 0440 05/10/13 0455 05/10/13 1803 05/11/13 0240 05/11/13 0323  HGB  14.3  --   --   --   --   --   --   --  14.3  --  13.1  --   WBC 7.8  --   --   --   --   --   --   --  17.3*  --  20.4*  --   PLT 274  --   --   --   --   --   --   --  254  --  201  --   NA 131*  --   --   --   --   --   --   --  135 135 132*  --   K 4.7  --   --   --   --   --   --   --  3.5 3.6 3.5  --   CL 95*  --   --   --   --   --   --   --  97 101 97  --   CO2 20  --   --   --   --   --   --   --  26 23 24   --   GLUCOSE 229*  --   --   --   --   --   --   --  130* 139* 171*  --   BUN 23  --   --   --   --   --   --   --  24* 22 21  --   CREATININE 0.76   --   --   --   --   --   --   --  0.79 0.86 0.97  --   CALCIUM 8.9  --   --   --   --   --   --   --  8.8 7.5* 7.5*  --   MG  --   --   --   --   --   --   --   --  1.7 1.8  --   --   PHOS  --   --   --   --   --   --   --   --  4.7* 3.8  --   --   AST 26  --   --   --   --   --   --   --   --   --   --   --   ALT 18  --   --   --   --   --   --   --   --   --   --   --   ALKPHOS 142*  --   --   --   --   --   --   --   --   --   --   --   BILITOT 0.3  --   --   --   --   --   --   --   --   --   --   --   PROT 6.0  --   --   --   --   --   --   --   --   --   --   --   ALBUMIN 3.1*  --   --   --   --   --   --   --   --   --   --   --  APTT 28  --   --   --   --   --   --   --   --   --   --   --   INR 1.01  --   --   --   --   --   --   --   --   --   --   --   TROPONINI  --   --   --   --   --   --  <0.30  --  <0.30  --   --   --   PROCALCITON  --   --   --  <0.10  --   --   --   --  0.52  --  0.49  --   PROBNP  --  591.0*  --   --   --   --   --   --   --   --   --   --   PHART  --   --   < >  --   < > 7.412  --  7.413  --   --   --  7.364  PCO2ART  --   --   < >  --   < > 40.6  --  43.2  --   --   --  48.7*  PO2ART  --   --   < >  --   < > 73.0*  --  97.0  --   --   --  84.0  < > = values in this interval not displayed.  Recent Labs Lab 05/10/13 1941 05/10/13 2133 05/10/13 2336 05/11/13 0344 05/11/13 0807  GLUCAP 134* 139* 113* 123* 141*    CXR:  R>L airspace disease, consolidation - improved aeration  ASSESSMENT / PLAN:  PULMONARY A:  Acute Respiratory Failure - in setting of cardiac arrest Pulmonary Edema - in setting of hypertensive crisis Aspiration PNA/ ALI - presumed aspiration on R, superimposed on ?pneumococcal pna   P:   Lower FIo2 to 60, PEEP to 8, no wean for now, desatn on lower PEEP -PRN albuterol   CARDIOVASCULAR A:  Hypertensive Emergency - initial pressure 190/80 Cardiac Arrest - PEA initial rhythm, required 7 min CPR in ER, no cooling since  neuro intact, CEs neg Known LBBB ICM Afib - hx of   P:   -ASA once above ruled out -goal MAP 65 ok now -levohped for above MAP goals -cards on board, ct amio gtt ,  home flecainide?  RENAL A:   High Risk ATN - in setting of hypotension / arrest  P:   -avoid nephrotoxins -CVp >10 goal   GASTROINTESTINAL A:  No issues  P:   -PPI -start TFs @ 20 -cause diarrhea per pt   HEMATOLOGIC / ONC A:   Hx of Breast CA / Hodgkins - s/p last Rx in 2013 - chemo RT to chest wall recurrence P:  sub q hep  INFECTIOUS A:   Aspiration PNA - presumed with multiple episodes of vomiting en route on infiltrate on R Pneumococcal pna  P:   -pan culture -abx as above, ceftaz, vanc   ENDOCRINE A:   Hyperglycemia  - in setting of arrest Hypothyroidisim -TSH 7 P:   -SSI  -continue synthroid  NEUROLOGIC A:   Acute Encephalopathy - in setting of arrest, resolved, head CT neg  P:   -fent gtt, versed prn   Updated pt at  bedside  I have personally obtained a history, examined the patient, evaluated laboratory and imaging results, formulated the assessment and plan and placed orders.  CRITICAL CARE: The patient is critically ill with multiple organ systems failure and requires high complexity decision making for assessment and support, frequent evaluation and titration of therapies, application of advanced monitoring technologies and extensive interpretation of multiple databases. Critical Care Time devoted to patient care services described in this note is 35 minutes.   Cyril Mourning MD. Tonny Bollman. [facility] Pager [phone] If no response call [phone]    05/11/2013, 9:59 AM

## 2013-05-11 NOTE — Progress Notes (Addendum)
AFib on Amio, HR 110-130   Cont Amio drip, send am labs and evaluate for electrolyte abnormalities.   Consider repeat Amio load;   BP goal SBP>100, MAP>55.

## 2013-05-11 NOTE — Progress Notes (Signed)
Sputum specimen sent to lab

## 2013-05-11 NOTE — Progress Notes (Signed)
Notified Dr Frederico Hamman about patients HR staying in the 120's

## 2013-05-11 NOTE — ED Provider Notes (Signed)
I have personally seen and examined the patient and discussed plan of care with the resident.  I was present for entire procedure (intubation).  I have reviewed the appropriate documentation on PMH/FH/Soc. History.  I have reviewed the documentation of the resident and agree.    Date: 05/09/2013  Rate: 99  Rhythm: normal sinus rhythm  QRS Axis: normal  Intervals: normal  ST/T Wave abnormalities: nonspecific ST changes  Conduction Disutrbances:left bundle branch block  Narrative Interpretation:   Old EKG Reviewed: unchanged, no STEMI, reviewed with cardiologist Dr Herbie Baltimore   CRITICAL CARE Performed by: Joya Gaskins Total critical care time: 35 Critical care time was exclusive of separately billable procedures and treating other patients. Critical care was necessary to treat or prevent imminent or life-threatening deterioration. Critical care was time spent personally by me on the following activities: development of treatment plan with  nursing, discussions with consultants, evaluation of patient's response to treatment, examination of patient, obtaining history from patient or surrogate, ordering and performing treatments and interventions, ordering and review of laboratory studies, ordering and review of radiographic studies, pulse oximetry and re-evaluation of patient's condition.  Cardiopulmonary Resuscitation (CPR) Procedure Note Directed/Performed by: Joya Gaskins I personally directed ancillary staff and/or performed CPR in an effort to regain return of spontaneous circulation and to maintain cardiac, neuro and systemic perfusion.    I was in room for intubation, during CPR, I spoke to consultants (cardiologists/critical care).  Pt was critically ill in the emergency department and had episode of cardiac arrest that resolved and she regained pulses while in the ER.  I spoke to Dr Tyson Alias with critical care who assumed care of patient.  She was admitted in critical condition  to the ICU    Joya Gaskins, MD 05/11/13 0300

## 2013-05-11 NOTE — Progress Notes (Signed)
Subjective:  Yesterday, the patient went into afib with RVR, and was started on an amiodarone bolus/drip.  She denies symptoms of palpitations, cp, or SOB presently or during that episode.  The patient is now in NSR with HR in the 50-60's.  The patient was febrile to 102 overnight as well, with urine positive for strep pneumo.  CXR this morning looks mildly improved, though still with right-sided infiltrate.  CVP 12 today.  Objective:  Vital Signs in the last 24 hours: Temp:  [98 F (36.7 C)-102 F (38.9 C)] 98.6 F (37 C) (05/24 0800) Pulse Rate:  [56-112] 56 (05/24 0900) Resp:  [13-36] 14 (05/24 0900) BP: (63-144)/(27-85) 97/35 mmHg (05/24 0900) SpO2:  [85 %-100 %] 98 % (05/24 0900) Arterial Line BP: (80-155)/(39-101) 117/47 mmHg (05/24 0900) FiO2 (%):  [60 %-80 %] 70 % (05/24 0812)  Intake/Output from previous day: 05/23 0701 - 05/24 0700 In: 1790.6 [I.V.:1240.6; IV Piggyback:550] Out: 795 [Urine:795]  Physical Exam: General: alert, cooperative, lying in bed in NAD HEENT: pupils equal round and reactive to light, vision grossly intact, oropharynx clear and non-erythematous, ETT in place Neck: supple, no lymphadenopathy, no JVD Lungs: mechanical breath sounds, ronchi R > L Heart: regular rate and rhythm Abdomen: soft, non-tender, non-distended, normal bowel sounds Extremities: no edema Neurologic: alert & oriented X3, cranial nerves II-XII intact, strength grossly intact, sensation intact to light touch  Lab Results:  Recent Labs  05/10/13 0455 05/11/13 0240  WBC 17.3* 20.4*  HGB 14.3 13.1  PLT 254 201    Recent Labs  05/10/13 1803 05/11/13 0240  NA 135 132*  K 3.6 3.5  CL 101 97  CO2 23 24  GLUCOSE 139* 171*  BUN 22 21  CREATININE 0.86 0.97    Recent Labs  05/09/13 2200 05/10/13 0455  TROPONINI <0.30 <0.30    Cardiac Studies: Echocardiogram 10/31/12 - Left ventricle: Systolic function was normal. The estimated ejection fraction was in the  range of 60% to 65%. Doppler parameters are consistent with abnormal left ventricular relaxation (grade 1 diastolic dysfunction). - Aortic valve: Trivial regurgitation. - Mitral valve: Mild regurgitation.  Tele: Afib overnight with rates to 120's, currently in sinus brady in 50's  Assessment/Plan:  The patient is a 67 yo woman, history of HTN, NICM, LBBB, afib, presenting with dyspnea and hypoxia, found to be hypertensive with pulmonary edema.   # Acute Respiratory Failure/VDRF - The patient presented with dyspnea and acute respiratory failure, found to have diffuse pulmonary edema as well as a right-sided infiltrate concerning for aspiration pneumonia (witnessed vomiting by EMS).  The patient's pulmonary edema has significantly improved this morning.   -abx per CCM, currently on vanc, ceftaz  # Atrial fibrillation - the patient has a history of afib with CHADS2-Vasc score of 5. Overnight, the patient went into afib with rate in 110-120's.  The patient is currently in sinus rhythm, HR in 50-60's.  Patient has been hypotensive, still requiring norepi.  Amiodarone bolus and drip started 5/23. -continue amiodarone drip -transition to flecainide when able to tolerate PO -holding coreg given hypotension  # Hypotension - the patient initially presented with elevated BP and pulmonary edema, thought to represent hypertensive emergency.  The patient was started on a nitro drip, but later had a PEA arrest, followed by hypotension, and the nitro drip was discontinued, and norepi was started.  -continue norepi, wean as able  # EKG abnormalities - the patient was initially called as a code STEMI, but further  review of EKG showed only stable changes and old LBBB.  The patient notes no chest pain.  The patient had an exercise stress test 11/2012, which was stopped early due to fatigue, but showed no acute abnormalities. Cardiac cath 04/22/04 showed 30% stenosis of LAD at that time.  # Hypothyroidism - the  patient has a history of hypothyroidism -continue synthroid  # Hyponatremia - Resolved  # Hypokalemia/Hypomagnesemia - Repleted  # History of breast cancer - s/p chemo, radiation   # Carotid Stenosis - 60-79% stenosis of L ICA seen on dopplers 02/12/13  Janalyn Harder, M.D. 05/11/2013, 9:44 AM   History and all data above reviewed.  Patient examined.  I agree with the findings as above.  Nauseated this am.  The patient exam reveals COR:RRR  ,  Lungs: Decreased breath sounds  ,  Abd: Positive bowel sounds, no rebound no guarding, Ext trace edema  .  All available labs, radiology testing, previous records reviewed. Agree with documented assessment and plan. Today we will stop hydration.  Possibly start diuresis later today or in the am.    Rollene Rotunda  11:25 AM  05/11/2013

## 2013-05-12 ENCOUNTER — Inpatient Hospital Stay (HOSPITAL_COMMUNITY): Payer: Medicare Other

## 2013-05-12 LAB — BLOOD GAS, ARTERIAL
Bicarbonate: 26.3 mEq/L — ABNORMAL HIGH (ref 20.0–24.0)
PEEP: 8 cmH2O
TCO2: 27.5 mmol/L (ref 0–100)
pH, Arterial: 7.432 (ref 7.350–7.450)
pO2, Arterial: 74.3 mmHg — ABNORMAL LOW (ref 80.0–100.0)

## 2013-05-12 LAB — BASIC METABOLIC PANEL
BUN: 16 mg/dL (ref 6–23)
Calcium: 7.9 mg/dL — ABNORMAL LOW (ref 8.4–10.5)
Chloride: 97 mEq/L (ref 96–112)
Creatinine, Ser: 0.77 mg/dL (ref 0.50–1.10)
GFR calc Af Amer: >90 mL/min (ref >90)

## 2013-05-12 LAB — CBC
HCT: 31.5 % — ABNORMAL LOW (ref 36.0–46.0)
MCHC: 34.9 g/dL (ref 30.0–36.0)
MCV: 89 fL (ref 78.0–100.0)
Platelets: 171 10*3/uL (ref 150–400)
RDW: 14.4 % (ref 11.5–15.5)

## 2013-05-12 MED ORDER — FUROSEMIDE 10 MG/ML IJ SOLN
20.0000 mg | Freq: Two times a day (BID) | INTRAMUSCULAR | Status: DC
  Administered 2013-05-12 – 2013-05-13 (×4): 20 mg via INTRAVENOUS
  Filled 2013-05-12 (×5): qty 2

## 2013-05-12 MED ORDER — JEVITY 1.2 CAL PO LIQD
1000.0000 mL | ORAL | Status: DC
  Administered 2013-05-12: 11:00:00
  Filled 2013-05-12: qty 1000

## 2013-05-12 MED ORDER — FUROSEMIDE 10 MG/ML IJ SOLN
INTRAMUSCULAR | Status: AC
  Filled 2013-05-12: qty 4

## 2013-05-12 MED ORDER — FLECAINIDE ACETATE 50 MG PO TABS
50.0000 mg | ORAL_TABLET | Freq: Two times a day (BID) | ORAL | Status: DC
  Administered 2013-05-12 – 2013-05-15 (×8): 50 mg via ORAL
  Filled 2013-05-12 (×10): qty 1

## 2013-05-12 MED ORDER — PROMETHAZINE HCL 25 MG/ML IJ SOLN
12.5000 mg | Freq: Four times a day (QID) | INTRAMUSCULAR | Status: DC | PRN
  Administered 2013-05-12 – 2013-05-14 (×3): 12.5 mg via INTRAVENOUS
  Filled 2013-05-12 (×3): qty 1

## 2013-05-12 NOTE — Progress Notes (Signed)
Peep decreased to 5 with good 02 saturations, patient placed on 5/5 cpap psv. Patient had low vt's and low 02 saturations, increased FIO2 to 100% until patient's 02 saturations increased, back down to 40% and PEEP-5.

## 2013-05-12 NOTE — Progress Notes (Signed)
SUBJECTIVE:  She is fully awake and alert.  No acute complaints.     PHYSICAL EXAM Filed Vitals:   05/12/13 0500 05/12/13 0600 05/12/13 0700 05/12/13 0800  BP: 111/35 91/79 116/45 124/44  Pulse: 65 61 66 67  Temp: 99.9 F (37.7 C) 100.2 F (37.9 C) 99.9 F (37.7 C) 99.9 F (37.7 C)  TempSrc: Core (Comment) Core (Comment) Core (Comment) Core (Comment)  Resp: 18 18 18 18   Height:      Weight:      SpO2: 95% 96% 96% 96%   General:  No distress Lungs:  Decreased breath sounds with coarse crackles right greater than left lung Heart:  RRR Abdomen:  Positive bowel sounds, no rebound no guarding Extremities:  No edema Neuro:  Nonfocal  LABS: Lab Results  Component Value Date   TROPONINI <0.30 05/10/2013   Results for orders placed during the hospital encounter of 05/09/13 (from the past 24 hour(s))  GLUCOSE, CAPILLARY     Status: Abnormal   Collection Time    05/11/13 12:02 PM      Result Value Range   Glucose-Capillary 107 (*) 70 - 99 mg/dL  GLUCOSE, CAPILLARY     Status: Abnormal   Collection Time    05/11/13  4:44 PM      Result Value Range   Glucose-Capillary 111 (*) 70 - 99 mg/dL  GLUCOSE, CAPILLARY     Status: None   Collection Time    05/11/13  7:29 PM      Result Value Range   Glucose-Capillary 86  70 - 99 mg/dL  GLUCOSE, CAPILLARY     Status: Abnormal   Collection Time    05/11/13 11:32 PM      Result Value Range   Glucose-Capillary 126 (*) 70 - 99 mg/dL  BLOOD GAS, ARTERIAL     Status: Abnormal   Collection Time    05/12/13  3:05 AM      Result Value Range   FIO2 0.40     Delivery systems VENTILATOR     Mode PRESSURE REGULATED VOLUME CONTROL     VT 500     Rate 18     Peep/cpap 8.0     pH, Arterial 7.432  7.350 - 7.450   pCO2 arterial 40.1  35.0 - 45.0 mmHg   pO2, Arterial 74.3 (*) 80.0 - 100.0 mmHg   Bicarbonate 26.3 (*) 20.0 - 24.0 mEq/L   TCO2 27.5  0 - 100 mmol/L   Acid-Base Excess 2.3 (*) 0.0 - 2.0 mmol/L   O2 Saturation 97.4     Patient  temperature 98.6     Collection site A-LINE     Drawn by [id number]     Sample type ARTERIAL DRAW    GLUCOSE, CAPILLARY     Status: Abnormal   Collection Time    05/12/13  4:09 AM      Result Value Range   Glucose-Capillary 117 (*) 70 - 99 mg/dL  BASIC METABOLIC PANEL     Status: Abnormal   Collection Time    05/12/13  4:20 AM      Result Value Range   Sodium 130 (*) 135 - 145 mEq/L   Potassium 4.1  3.5 - 5.1 mEq/L   Chloride 97  96 - 112 mEq/L   CO2 26  19 - 32 mEq/L   Glucose, Bld 130 (*) 70 - 99 mg/dL   BUN 16  6 - 23 mg/dL   Creatinine, Ser 8.11  0.50 -  1.10 mg/dL   Calcium 7.9 (*) 8.4 - 10.5 mg/dL   GFR calc non Af Amer 85 (*) >90 mL/min   GFR calc Af Amer >90  >90 mL/min  CBC     Status: Abnormal   Collection Time    05/12/13  4:20 AM      Result Value Range   WBC 18.3 (*) 4.0 - 10.5 K/uL   RBC 3.54 (*) 3.87 - 5.11 MIL/uL   Hemoglobin 11.0 (*) 12.0 - 15.0 g/dL   HCT 95.2 (*) 84.1 - 32.4 %   MCV 89.0  78.0 - 100.0 fL   MCH 31.1  26.0 - 34.0 pg   MCHC 34.9  30.0 - 36.0 g/dL   RDW 40.1  02.7 - 25.3 %   Platelets 171  150 - 400 K/uL  GLUCOSE, CAPILLARY     Status: Abnormal   Collection Time    05/12/13  7:47 AM      Result Value Range   Glucose-Capillary 128 (*) 70 - 99 mg/dL    Intake/Output Summary (Last 24 hours) at 05/12/13 6644 Last data filed at 05/12/13 0700  Gross per 24 hour  Intake 2352.7 ml  Output   1010 ml  Net 1342.7 ml    ASSESSMENT AND PLAN:  Acute Respiratory Failure/VDRF - Aspiration pneumonia.  Probably some element of CHF.  I will start low dose diuresis today.    Atrial fibrillation - Brief run during this admission.  I will stop the amiodarone and start Flecainide VT as she was on this at home.   Hypotension -   BP is improved.    Fayrene Fearing Kamarie Veno 05/12/2013 9:22 AM

## 2013-05-12 NOTE — Progress Notes (Signed)
Patient had some more vomiting while intubated. Gave her a bolus of fentanyl and increased rate. Paged Physician and will continue to assess and monitor.

## 2013-05-12 NOTE — Progress Notes (Signed)
PCCM  CTSP for emesis and nausea Abd exam benign No culprit meds   Plan D/c TF NG to LIS Prn promethazine    Shan Levans MD Beeper  [phone]  Cell  [phone]  If no response or cell goes to voicemail, call beeper [phone]

## 2013-05-12 NOTE — Progress Notes (Addendum)
Patient vomited while intubated with family in room. Portia RN in room and assisted patient with medication for nausea and clean up. Physician Dr. Vassie Loll notified and will continue to monitor. Patient had just received potassium via og tube. Will continue to assess and monitor.

## 2013-05-12 NOTE — Progress Notes (Signed)
PULMONARY  / CRITICAL CARE MEDICINE  Name: [name] MRN: [id number] DOB: 11-19-1946    ADMISSION DATE:  05/09/2013 CONSULTATION DATE:  05/09/13  REFERRING MD :  EDP - Dr. Bebe Shaggy PRIMARY SERVICE: PCCM  CHIEF COMPLAINT:  Hypertensive Crisis, Cardiac Arrest, Pulmonary Edema  BRIEF PATIENT DESCRIPTION: [name] who presented to [facility] ER on 5/22 with hypertensive crisis, pulmonary edema and subsequent cardiac arrest (PEA initial rhythm).   SIGNIFICANT EVENTS / STUDIES:  5/22 - Admit with Hypertensive Crisis, Cardiac Arrest, Pulmonary Edema, aspiration PNA  LINES / TUBES: OETT 5/22>>> R IJ TLC 5/22>>> R Fem 5/22>>>  CULTURES: BCx2 5/22>>>ng Sputum 5/22>>> Urine 5/22>>>ng U. Strep 5/22>>>POS U. Legionella 5/22>>>neg  ANTIBIOTICS: Vanco 5/22>>> Ceftaz 5/22>>>  HISTORY OF PRESENT ILLNESS:  [name], former [employer], with PMH of NICM - EF of 45%, Afib, LBBB, HTN, Hypothyroidism, Hodgkins Dx in 1988 s/p XRT, breast cancer in 2009 with reoccurence in 2013 sp taxotere, cytoxan, XRT who according to husband, had been complaining of several days of shortness of breath, and "feeling like her lungs are filling up", patient called EMS herself and on arrival sats were 40%, pt cyanotic with agonal respirations.  EMS did BVM with multiple episodes of vomiting in route.  On arrival to ER sats were in 70's.  Intubated on arrival.  Was hypertensive and placed on NTG gtt and given lasix, versed / fentanyl.   Became hypotensive with desaturations and rrested in ER with PEA arrest requiring 7 minutes of in house CPR with ROSC (given epi, calcium, bicarb x2).  Pt then became hypertensive again after arrest (220/9=80's) and NTG continued.  On arrival of Critical Care team to ER, patient became hypotensive and NTG stopped.  Levophed started.  PCCM called for admit / potential hypothermia.    SUBJECTIVE: denies pain levophed  At lower doses Wide awake off drips Down to 40%/+5 Episode of vomiting with  KCL  VITAL SIGNS: Temp:  [98.6 F (37 C)-101.7 F (38.7 C)] 99.9 F (37.7 C) (05/25 0800) Pulse Rate:  [58-79] 66 (05/25 0900) Resp:  [14-28] 18 (05/25 0900) BP: (81-133)/(29-79) 104/36 mmHg (05/25 0900) SpO2:  [91 %-100 %] 92 % (05/25 0900) Arterial Line BP: (60-164)/(37-64) 114/47 mmHg (05/25 0900) FiO2 (%):  [40 %-70 %] 40 % (05/25 1002)  HEMODYNAMICS: CVP:  [4 mmHg-12 mmHg] 11 mmHg  VENTILATOR SETTINGS: Vent Mode:  [-] PSV;CPAP FiO2 (%):  [40 %-70 %] 40 % Set Rate:  [14 bmp-18 bmp] 18 bmp Vt Set:  [500 mL] 500 mL PEEP:  [5 cmH20-8 cmH20] 5 cmH20 Pressure Support:  [15 cmH20] 15 cmH20 Plateau Pressure:  [20 cmH20-29 cmH20] 20 cmH20  INTAKE / OUTPUT: Intake/Output     05/24 0701 - 05/25 0700 05/25 0701 - 05/26 0700   I.V. (mL/kg) 1476.1 (23.2) 129 (2)   NG/GT 355 60   IV Piggyback 650    Total Intake(mL/kg) 2481.1 (39) 189 (3)   Urine (mL/kg/hr) 1010 (0.7)    Total Output 1010     Net +1471.1 +189          PHYSICAL EXAMINATION: General:  acutely ill on vent Neuro:  Alert, non focal, power 5/5 all 4Es HEENT:  OETT, mm pink/moist Cardiovascular:  s1s2 rrr, no m/r/g Lungs:  resp's even/non-labored, lungs bilaterally coarse R>L, BLL crackles Abdomen:  Round/soft Musculoskeletal:  No acute deformities Skin:  Cool /dry  LABS:  Recent Labs Lab 05/09/13 1648 05/09/13 1651  05/09/13 1954  05/09/13 2109 05/09/13 2200  05/10/13 0440 05/10/13 0455 05/10/13 1803 05/11/13 0240 05/11/13 0323 05/12/13 0305 05/12/13 0420  HGB 14.3  --   --   --   --   --   --   --  14.3  --  13.1  --   --  11.0*  WBC 7.8  --   --   --   --   --   --   --  17.3*  --  20.4*  --   --  18.3*  PLT 274  --   --   --   --   --   --   --  254  --  201  --   --  171  NA 131*  --   --   --   --   --   --   --  135 135 132*  --   --  130*  K 4.7  --   --   --   --   --   --   --  3.5 3.6 3.5  --   --  4.1  CL 95*  --   --   --   --   --   --   --  97 101 97  --   --  97  CO2 20  --   --    --   --   --   --   --  26 23 24   --   --  26  GLUCOSE 229*  --   --   --   --   --   --   --  130* 139* 171*  --   --  130*  BUN 23  --   --   --   --   --   --   --  24* 22 21  --   --  16  CREATININE 0.76  --   --   --   --   --   --   --  0.79 0.86 0.97  --   --  0.77  CALCIUM 8.9  --   --   --   --   --   --   --  8.8 7.5* 7.5*  --   --  7.9*  MG  --   --   --   --   --   --   --   --  1.7 1.8  --   --   --   --   PHOS  --   --   --   --   --   --   --   --  4.7* 3.8  --   --   --   --   AST 26  --   --   --   --   --   --   --   --   --   --   --   --   --   ALT 18  --   --   --   --   --   --   --   --   --   --   --   --   --   ALKPHOS 142*  --   --   --   --   --   --   --   --   --   --   --   --   --  BILITOT 0.3  --   --   --   --   --   --   --   --   --   --   --   --   --   PROT 6.0  --   --   --   --   --   --   --   --   --   --   --   --   --   ALBUMIN 3.1*  --   --   --   --   --   --   --   --   --   --   --   --   --   APTT 28  --   --   --   --   --   --   --   --   --   --   --   --   --   INR 1.01  --   --   --   --   --   --   --   --   --   --   --   --   --   TROPONINI  --   --   --   --   --   --  <0.30  --  <0.30  --   --   --   --   --   PROCALCITON  --   --   --  <0.10  --   --   --   --  0.52  --  0.49  --   --   --   PROBNP  --  591.0*  --   --   --   --   --   --   --   --   --   --   --   --   PHART  --   --   < >  --   < > 7.412  --  7.413  --   --   --  7.364 7.432  --   PCO2ART  --   --   < >  --   < > 40.6  --  43.2  --   --   --  48.7* 40.1  --   PO2ART  --   --   < >  --   < > 73.0*  --  97.0  --   --   --  84.0 74.3*  --   < > = values in this interval not displayed.  Recent Labs Lab 05/11/13 1644 05/11/13 1929 05/11/13 2332 05/12/13 0409 05/12/13 0747  GLUCAP 111* 86 126* 117* 128*    CXR:  R>L airspace disease, consolidation - improved aeration  ASSESSMENT / PLAN:  PULMONARY A:  Acute Respiratory Failure - in setting of cardiac  arrest Pulmonary Edema - in setting of hypertensive crisis Aspiration PNA/ ALI - presumed aspiration on R, superimposed on ?pneumococcal pna   P:   Start SBTs -goal extubate in 24h -PRN albuterol   CARDIOVASCULAR A:  Hypertensive Emergency - initial pressure 190/80 Cardiac Arrest - PEA initial rhythm, required 7 min CPR in ER, no cooling since neuro intact, CEs neg Known LBBB ICM Afib - hx of   P:   -ASA once above ruled out -goal MAP 65 ok now -levohped for above MAP goals -cards on board, ct amio gtt ,  home flecainide?  RENAL  A:   High Risk ATN - in setting of hypotension / arrest  P:   -avoid nephrotoxins -CVp >10 goal   GASTROINTESTINAL A:  No issues  P:   -PPI -start TFs @ 20 -cause diarrhea per pt   HEMATOLOGIC / ONC A:   Hx of Breast CA / Hodgkins - s/p last Rx in 2013 - chemo RT to chest wall recurrence P:  sub q hep  INFECTIOUS A:   Aspiration PNA - presumed with multiple episodes of vomiting en route on infiltrate on R Pneumococcal pna  P:   -pan culture -abx as above, ceftaz, vanc -simplify in 24h once clinical status better   ENDOCRINE A:   Hyperglycemia  - in setting of arrest Hypothyroidisim -TSH 7 P:   -SSI  -continue synthroid  NEUROLOGIC A:   Acute Encephalopathy - in setting of arrest, resolved, head CT neg  P:   -fent gtt, versed prn   Updated pt at bedside  I have personally obtained a history, examined the patient, evaluated laboratory and imaging results, formulated the assessment and plan and placed orders.  CRITICAL CARE: The patient is critically ill with multiple organ systems failure and requires high complexity decision making for assessment and support, frequent evaluation and titration of therapies, application of advanced monitoring technologies and extensive interpretation of multiple databases. Critical Care Time devoted to patient care services described in this note is 35 minutes.   Cyril Mourning MD.  Tonny Bollman. [place] Pager [phone] If no response call [phone]    05/12/2013, 10:38 AM

## 2013-05-12 NOTE — Progress Notes (Signed)
Called to room and patient was vomiting with family and respiratory at bedside. Placed tube feeding on hold and notified physician. Patient wrote down that it was the tube that gagged her and caused her to throw up. Medicated her for nausea and will continue to assess and monitor.

## 2013-05-12 NOTE — Consult Note (Signed)
ANTIBIOTIC CONSULT NOTE - follow up  Pharmacy Consult for Vancomycin Indication: aspiration pneumonia  Allergies  Allergen Reactions  . Benazepril     cough  . Contrast Media (Iodinated Diagnostic Agents)   . Epinephrine     REACTION: BUZZ  . Iohexol      Desc: SNEEZING, pt states this happened in 1988. Has had iv contrast since without being premedicated and has had no problems. Chest ct 2005- LC 05/09/12   . Taxotere (Docetaxel) Other (See Comments)    Patient Measurements: Height: 5' 2.99" (160 cm) Weight:  (bed scale inaccurate) IBW/kg (Calculated) : 52.38  Vital Signs: Temp: 99.5 F (37.5 C) (05/25 1200) Temp src: Core (Comment) (05/25 1200) BP: 118/51 mmHg (05/25 1300) Pulse Rate: 72 (05/25 1300) Intake/Output from previous day: 05/24 0701 - 05/25 0700 In: 2481.1 [I.V.:1476.1; NG/GT:355; IV Piggyback:650] Out: 1010 [Urine:1010] Intake/Output from this shift: Total I/O In: 466.1 [I.V.:266.1; NG/GT:200] Out: 600 [Urine:600]  Labs:  Recent Labs  05/10/13 0455 05/10/13 1803 05/11/13 0240 05/12/13 0420  WBC 17.3*  --  20.4* 18.3*  HGB 14.3  --  13.1 11.0*  PLT 254  --  201 171  CREATININE 0.79 0.86 0.97 0.77   Estimated Creatinine Clearance: 61.3 ml/min (by C-G formula based on Cr of 0.77).  Microbiology: Recent Results (from the past 720 hour(s))  URINE CULTURE     Status: None   Collection Time    05/09/13  5:34 PM      Result Value Range Status   Specimen Description URINE, CATHETERIZED   Final   Special Requests NONE   Final   Culture  Setup Time 05/09/2013 18:09   Final   Colony Count NO GROWTH   Final   Culture NO GROWTH   Final   Report Status 05/11/2013 FINAL   Final  CULTURE, BLOOD (ROUTINE X 2)     Status: None   Collection Time    05/09/13  7:30 PM      Result Value Range Status   Specimen Description BLOOD HAND RIGHT   Final   Special Requests BOTTLES DRAWN AEROBIC ONLY 7CC   Final   Culture  Setup Time 05/10/2013 01:45   Final   Culture     Final   Value:        BLOOD CULTURE RECEIVED NO GROWTH TO DATE CULTURE WILL BE HELD FOR 5 DAYS BEFORE ISSUING A FINAL NEGATIVE REPORT   Report Status PENDING   Incomplete  CULTURE, BLOOD (ROUTINE X 2)     Status: None   Collection Time    05/09/13  7:45 PM      Result Value Range Status   Specimen Description BLOOD HAND LEFT   Final   Special Requests BOTTLES DRAWN AEROBIC ONLY [facility]   Final   Culture  Setup Time 05/10/2013 01:46   Final   Culture     Final   Value:        BLOOD CULTURE RECEIVED NO GROWTH TO DATE CULTURE WILL BE HELD FOR 5 DAYS BEFORE ISSUING A FINAL NEGATIVE REPORT   Report Status PENDING   Incomplete  MRSA PCR SCREENING     Status: None   Collection Time    05/09/13  7:45 PM      Result Value Range Status   MRSA by PCR NEGATIVE  NEGATIVE Final   Comment:            The GeneXpert MRSA Assay (FDA     approved for NASAL  specimens     only), is one component of a     comprehensive MRSA colonization     surveillance program. It is not     intended to diagnose MRSA     infection nor to guide or     monitor treatment for     MRSA infections.  URINE CULTURE     Status: None   Collection Time    05/10/13  4:38 AM      Result Value Range Status   Specimen Description URINE, CATHETERIZED   Final   Special Requests CX ADDED AT 0507 ON [id number]   Final   Culture  Setup Time 05/10/2013 05:11   Final   Colony Count NO GROWTH   Final   Culture NO GROWTH   Final   Report Status 05/11/2013 FINAL   Final    Medical History: Past Medical History  Diagnosis Date  . Atrial fibrillation     dx in 2008;  CHADS2-VASc=5  . LBBB (left bundle branch block)   . NICM (nonischemic cardiomyopathy)     a. EF as low as 45%;  b. LHC 5/05:  LM 10%, pLAD 30%, EF 45-50%,   c. Echo 5/13:  EF 60-65%, Gr 1 diast dysfn, MAC;   d.  Echo 11/13:  EF 60-65%, Gr 1 diast dysfn, Tr AI, mild MR  . HTN (hypertension)   . Hypothyroidism     2/2 XRT for Hodgkins Lymphoma  . Raynaud's disease    . Anemia   . Blood transfusion     1968  . Arthritis     fingers   . Hodgkin's disease(201)     stage 2b;  s/p XRT 1988  . Breast cancer 2009    T1N0M0 DCIS right breast  . Breast cancer 2013    a. reocurrence right breast;  b. s/p Taxotere, Cytoxan and XRT  . Status post radiation therapy 1988    mantle and periaortic   . Mucositis 04/29/2012  . History of radiation therapy 07/10/12-08/13/12    right breast/chest wall  . Carotid stenosis     a. dopplers 8/13:  RICA 0-39%, LICA 60-79% => repeat due in 01/2013   Assessment: 66yof presented to the ED with hypoxia and vomiting. She was subsequently intubated and then coded PEA arrest with return of pulses after ~ 5-10 minutes.  Broad spectrum antibiotics Vancomycin and Fortaz- possibly aspiration pneumonia.  CXr with RML/RLL infiltrate.  WBC improving 23>18 now off pressors with SBP 110.  Tm 102 but afebrile today.  Renal function stable.  Strep urine antigen +.    Goal of Therapy:  Vancomycin trough level 15-20 mcg/ml  Plan: : 1) Vancomycin 750mg  IV q12 2) Continue Ceftazidime 1g IV q8 3) Consider narrow antibiotic to Rocephin 1gm q24 - for strep pneumonia    Leota Sauers Pharm.D. CPP, BCPS Clinical Pharmacist [phone] 05/12/2013 1:51 PM

## 2013-05-13 ENCOUNTER — Inpatient Hospital Stay (HOSPITAL_COMMUNITY): Payer: Medicare Other

## 2013-05-13 LAB — CBC
Hemoglobin: 10.1 g/dL — ABNORMAL LOW (ref 12.0–15.0)
MCH: 31.6 pg (ref 26.0–34.0)
MCHC: 35.2 g/dL (ref 30.0–36.0)
MCV: 89.7 fL (ref 78.0–100.0)
RBC: 3.2 MIL/uL — ABNORMAL LOW (ref 3.87–5.11)

## 2013-05-13 LAB — GLUCOSE, CAPILLARY
Glucose-Capillary: 106 mg/dL — ABNORMAL HIGH (ref 70–99)
Glucose-Capillary: 84 mg/dL (ref 70–99)
Glucose-Capillary: 97 mg/dL (ref 70–99)

## 2013-05-13 LAB — BASIC METABOLIC PANEL
BUN: 16 mg/dL (ref 6–23)
CO2: 28 mEq/L (ref 19–32)
Glucose, Bld: 98 mg/dL (ref 70–99)
Potassium: 3.2 mEq/L — ABNORMAL LOW (ref 3.5–5.1)
Sodium: 133 mEq/L — ABNORMAL LOW (ref 135–145)

## 2013-05-13 MED ORDER — VITAL AF 1.2 CAL PO LIQD
1000.0000 mL | ORAL | Status: DC
  Administered 2013-05-13: 1000 mL
  Filled 2013-05-13 (×4): qty 1000

## 2013-05-13 MED ORDER — FUROSEMIDE 10 MG/ML IJ SOLN
INTRAMUSCULAR | Status: AC
  Filled 2013-05-13: qty 4

## 2013-05-13 MED ORDER — JEVITY 1.2 CAL PO LIQD
1000.0000 mL | ORAL | Status: DC
  Filled 2013-05-13: qty 1000

## 2013-05-13 MED ORDER — POTASSIUM CHLORIDE 10 MEQ/50ML IV SOLN
10.0000 meq | INTRAVENOUS | Status: AC
  Administered 2013-05-13 (×4): 10 meq via INTRAVENOUS
  Filled 2013-05-13 (×2): qty 100

## 2013-05-13 NOTE — Progress Notes (Signed)
PT Cancellation Note  Patient Details Name: [name] MRN: [id number] DOB: [birth year]   Cancelled Treatment:    Reason Eval/Treat Not Completed: Patient not medically ready. Pt to have femoral line and unable to advance to EOB mobility. RN taking femoral line out but will have to be on strict bed rest x 2 hours.  PT to re-attempt 5/27.   Marcene Brawn 05/13/2013, 3:06 PM

## 2013-05-13 NOTE — Progress Notes (Signed)
NUTRITION FOLLOW UP  Intervention:   1. D/c Jevity 2. Initiate Vital 1.2 @ 20 ml/hr and advance by 10 ml q 4 hr to a goal rate of 55 ml/hr. This EN regimen will provide 1584 kcal, 99 gm protein, and 1071 ml free water.    Nutrition Dx:   Inadequate oral intake related to inability to eat as evidenced by NPO diet. Ongoing  Goal:   Meet >/=90% estimated nutrition needs. Unmet   Monitor:   TF initiation and tolerance, weight trends, labs  Assessment:   Pt remains intubated. Per notes, started on Jevity 1.2 at 20 ml/hr and held. Pt has fear that TF will cause diarrhea. Pt has had several episodes of vomiting since TF has been started. Per MD possibly related to meds given or ET tube placement.  RD consulted to re-start TF today- will use a elemental formula to promote GI tolerance. Pt reports she has not had a bowel movement since being admitted.   Height: Ht Readings from Last 1 Encounters:  05/09/13 5' 2.99" (1.6 m)    Weight Status:   Wt Readings from Last 1 Encounters:  03/12/13 140 lb 3.2 oz (63.594 kg)   Patient is currently intubated on ventilator support.  MV: 6.6  Temp:Temp (24hrs), Avg:100.5 F (38.1 C), Min:98.6 F (37 C), Max:101.7 F (38.7 C)  Propofol: none   Re-estimated needs:  Kcal: 1554 Protein: >/=95 gm  Fluid: >/= 1.5 L   Skin: intact   Diet Order:  NPO   Intake/Output Summary (Last 24 hours) at 05/13/13 0843 Last data filed at 05/13/13 0700  Gross per 24 hour  Intake 1465.1 ml  Output   2125 ml  Net -659.9 ml    Last BM: PTA    Labs:   Recent Labs Lab 05/09/13 1648 05/10/13 0455 05/10/13 1803 05/11/13 0240 05/12/13 0420 05/13/13 0430  NA 131* 135 135 132* 130* 133*  K 4.7 3.5 3.6 3.5 4.1 3.2*  CL 95* 97 101 97 97 96  CO2 20 26 23 24 26 28   BUN 23 24* 22 21 16 16   CREATININE 0.76 0.79 0.86 0.97 0.77 0.80  CALCIUM 8.9 8.8 7.5* 7.5* 7.9* 8.4  MG  --  1.7 1.8  --   --   --   PHOS  --  4.7* 3.8  --   --   --   GLUCOSE 229*  130* 139* 171* 130* 98    CBG (last 3)   Recent Labs  05/12/13 2321 05/13/13 0411 05/13/13 0831  GLUCAP 130* 95 106*    Scheduled Meds: . antiseptic oral rinse  15 mL Mouth Rinse QID  . cefTAZidime (FORTAZ)  IV  1 g Intravenous Q8H  . chlorhexidine  15 mL Mouth Rinse BID  . flecainide  50 mg Oral Q12H  . furosemide      . furosemide  20 mg Intravenous BID  . heparin subcutaneous  5,000 Units Subcutaneous Q8H  . insulin aspart  2-6 Units Subcutaneous Q4H  . levothyroxine  125 mcg Oral QAC breakfast  . pantoprazole sodium  40 mg Per Tube Q1200  . potassium chloride  10 mEq Intravenous Q1 Hr x 4  . sodium chloride  500 mL Intravenous Once  . vancomycin  1,000 mg Intravenous Once  . vancomycin  750 mg Intravenous Q12H    Continuous Infusions: . sodium chloride Stopped (05/13/13 0605)  . feeding supplement (JEVITY 1.2 CAL)    . fentaNYL infusion INTRAVENOUS 75 mcg/hr (05/12/13 2134)  .  norepinephrine (LEVOPHED) Adult infusion Stopped (05/13/13 0500)    Clarene Duke RD, LDN Pager [phone] After Hours pager [phone]

## 2013-05-13 NOTE — Progress Notes (Signed)
PULMONARY  / CRITICAL CARE MEDICINE  Name: [name] MRN: [id number] DOB: March 03, 1946    ADMISSION DATE:  05/09/2013 CONSULTATION DATE:  05/09/13  REFERRING MD :  EDP - Dr. Bebe Shaggy PRIMARY SERVICE: PCCM  CHIEF COMPLAINT:  Hypertensive Crisis, Cardiac Arrest, Pulmonary Edema  BRIEF PATIENT DESCRIPTION: [name] who presented to [facility] ER on 5/22 with hypertensive crisis, pulmonary edema and subsequent cardiac arrest (PEA initial rhythm).   SIGNIFICANT EVENTS / STUDIES:  5/22 - Admit with Hypertensive Crisis, Cardiac Arrest, Pulmonary Edema, aspiration PNA 5/24 vomiting with KCL 5/25 vomiting due to tube irritation  LINES / TUBES: OETT 5/22>>> R IJ TLC 5/22>>> R Fem 5/22>>>5/26  CULTURES: BCx2 5/22>>>ng Sputum 5/22>>> Urine 5/22>>>ng U. Strep 5/22>>>POS U. Legionella 5/22>>>neg  ANTIBIOTICS: Vanco 5/22>>> Ceftaz 5/22>>>  HISTORY OF PRESENT ILLNESS:  [name], former [employer], with PMH of NICM - EF of 45%, Afib, LBBB, HTN, Hypothyroidism, Hodgkins Dx in 1988 s/p XRT, breast cancer in 2009 with reoccurence in 2013 sp taxotere, cytoxan, XRT who according to husband, had been complaining of several days of shortness of breath, and "feeling like her lungs are filling up", patient called EMS herself and on arrival sats were 40%, pt cyanotic with agonal respirations.  EMS did BVM with multiple episodes of vomiting in route.  On arrival to ER sats were in 70's.  Intubated on arrival.  Was hypertensive and placed on NTG gtt and given lasix, versed / fentanyl.   Became hypotensive with desaturations and rrested in ER with PEA arrest requiring 7 minutes of in house CPR with ROSC (given epi, calcium, bicarb x2).  Pt then became hypertensive again after arrest (220/9=80's) and NTG continued.  On arrival of Critical Care team to ER, patient became hypotensive and NTG stopped.  Levophed started.  PCCM called for admit / potential hypothermia.    SUBJECTIVE: denies pain Off levophed  Wide awake  off drips Down to 40%/+5 Episode of vomiting with tube irritation  VITAL SIGNS: Temp:  [98.6 F (37 C)-101.7 F (38.7 C)] 100.3 F (37.9 C) (05/26 0800) Pulse Rate:  [61-123] 123 (05/26 0805) Resp:  [11-23] 23 (05/26 0805) BP: (86-166)/(26-92) 117/44 mmHg (05/26 0800) SpO2:  [90 %-100 %] 97 % (05/26 0805) Arterial Line BP: (55-138)/(39-85) 133/54 mmHg (05/26 0800) FiO2 (%):  [40 %-60 %] 60 % (05/26 0805)  HEMODYNAMICS: CVP:  [8 mmHg-11 mmHg] 8 mmHg  VENTILATOR SETTINGS: Vent Mode:  [-] PSV;CPAP FiO2 (%):  [40 %-60 %] 60 % Set Rate:  [18 bmp] 18 bmp Vt Set:  [500 mL] 500 mL PEEP:  [5 cmH20] 5 cmH20 Pressure Support:  [12 cmH20-15 cmH20] 12 cmH20 Plateau Pressure:  [20 cmH20-23 cmH20] 22 cmH20  INTAKE / OUTPUT: Intake/Output     05/25 0701 - 05/26 0700 05/26 0701 - 05/27 0700   I.V. (mL/kg) 825.6 (13)    NG/GT 260    IV Piggyback 500    Total Intake(mL/kg) 1585.6 (24.9)    Urine (mL/kg/hr) 2130 (1.4)    Emesis/NG output 70 (0)    Total Output 2200     Net -614.4          Emesis Occurrence 2 x      PHYSICAL EXAMINATION: General:  acutely ill on vent Neuro:  Alert, non focal, power 5/5 all 4Es HEENT:  OETT, mm pink/moist Cardiovascular:  s1s2 rrr, no m/r/g Lungs:  resp's even/non-labored, lungs bilaterally coarse R>L, BLL crackles Abdomen:  Round/soft Musculoskeletal:  No acute deformities Skin:  Cool /dry  LABS:  Recent Labs Lab 05/09/13 1648 05/09/13 1651  05/09/13 1954  05/09/13 2109 05/09/13 2200 05/10/13 0440 05/10/13 0455 05/10/13 1803 05/11/13 0240 05/11/13 0323 05/12/13 0305 05/12/13 0420 05/13/13 0430  HGB 14.3  --   --   --   --   --   --   --  14.3  --  13.1  --   --  11.0* 10.1*  WBC 7.8  --   --   --   --   --   --   --  17.3*  --  20.4*  --   --  18.3* 15.7*  PLT 274  --   --   --   --   --   --   --  254  --  201  --   --  171 196  NA 131*  --   --   --   --   --   --   --  135 135 132*  --   --  130* 133*  K 4.7  --   --   --   --    --   --   --  3.5 3.6 3.5  --   --  4.1 3.2*  CL 95*  --   --   --   --   --   --   --  97 101 97  --   --  97 96  CO2 20  --   --   --   --   --   --   --  26 23 24   --   --  26 28  GLUCOSE 229*  --   --   --   --   --   --   --  130* 139* 171*  --   --  130* 98  BUN 23  --   --   --   --   --   --   --  24* 22 21  --   --  16 16  CREATININE 0.76  --   --   --   --   --   --   --  0.79 0.86 0.97  --   --  0.77 0.80  CALCIUM 8.9  --   --   --   --   --   --   --  8.8 7.5* 7.5*  --   --  7.9* 8.4  MG  --   --   --   --   --   --   --   --  1.7 1.8  --   --   --   --   --   PHOS  --   --   --   --   --   --   --   --  4.7* 3.8  --   --   --   --   --   AST 26  --   --   --   --   --   --   --   --   --   --   --   --   --   --   ALT 18  --   --   --   --   --   --   --   --   --   --   --   --   --   --  ALKPHOS 142*  --   --   --   --   --   --   --   --   --   --   --   --   --   --   BILITOT 0.3  --   --   --   --   --   --   --   --   --   --   --   --   --   --   PROT 6.0  --   --   --   --   --   --   --   --   --   --   --   --   --   --   ALBUMIN 3.1*  --   --   --   --   --   --   --   --   --   --   --   --   --   --   APTT 28  --   --   --   --   --   --   --   --   --   --   --   --   --   --   INR 1.01  --   --   --   --   --   --   --   --   --   --   --   --   --   --   TROPONINI  --   --   --   --   --   --  <0.30  --  <0.30  --   --   --   --   --   --   PROCALCITON  --   --   --  <0.10  --   --   --   --  0.52  --  0.49  --   --   --   --   PROBNP  --  591.0*  --   --   --   --   --   --   --   --   --   --   --   --   --   PHART  --   --   < >  --   < > 7.412  --  7.413  --   --   --  7.364 7.432  --   --   PCO2ART  --   --   < >  --   < > 40.6  --  43.2  --   --   --  48.7* 40.1  --   --   PO2ART  --   --   < >  --   < > 73.0*  --  97.0  --   --   --  84.0 74.3*  --   --   < > = values in this interval not displayed.  Recent Labs Lab 05/12/13 1151 05/12/13 1629  05/12/13 1943 05/12/13 2321 05/13/13 0411  GLUCAP 100* 100* 86 130* 95    CXR:  R>L airspace disease, consolidation - improved aeration  ASSESSMENT / PLAN:  PULMONARY A:  Acute Respiratory Failure - in setting of cardiac arrest Pulmonary Edema - in setting of hypertensive crisis Aspiration PNA/ ALI - presumed aspiration on R, superimposed on ?pneumococcal pna   P:  Start SBTs -tolerates 12/5 but desaturates on lower PS-goal extubate soon -Pull ETT back from carina -21 cm at lip ok -PRN albuterol   CARDIOVASCULAR A:  Hypertensive Emergency - initial pressure 190/80 Cardiac Arrest - PEA initial rhythm, required 7 min CPR in ER, no cooling since neuro intact, CEs neg Known LBBB ICM Afib - hx of   P:  -ASA once above ruled out -goal MAP 65 ok now -cards on board, Off amio gtt ,  Resumed home flecainide  RENAL A:   High Risk ATN - in setting of hypotension / arrest  P:   -avoid nephrotoxins -CVp >10 goal   GASTROINTESTINAL A:  No issues  P:   -PPI -start TFs @ 20 -cause diarrhea per pt   HEMATOLOGIC / ONC A:   Hx of Breast CA / Hodgkins - s/p last Rx in 2013 - chemo RT to chest wall recurrence P:  sub q hep  INFECTIOUS A:   Aspiration PNA - presumed with multiple episodes of vomiting en route on infiltrate on R Pneumococcal pna  P:   -abx as above, ceftaz, vanc -simplify in 24h once clinical status better   ENDOCRINE A:   Hyperglycemia  - in setting of arrest Hypothyroidisim -TSH 7 P:   -SSI  -continue synthroid  NEUROLOGIC A:   Acute Encephalopathy - in setting of arrest, resolved, head CT neg  P:   -fent gtt, versed prn   Updated pt at bedside  I have personally obtained a history, examined the patient, evaluated laboratory and imaging results, formulated the assessment and plan and placed orders.  CRITICAL CARE: The patient is critically ill with multiple organ systems failure and requires high complexity decision making for  assessment and support, frequent evaluation and titration of therapies, application of advanced monitoring technologies and extensive interpretation of multiple databases. Critical Care Time devoted to patient care services described in this note is 35 minutes.   Cyril Mourning MD. Tonny Bollman. [place] [facility] Pager [phone] If no response call [phone]    05/13/2013, 8:31 AM

## 2013-05-13 NOTE — Progress Notes (Signed)
[facility] ADULT ICU REPLACEMENT PROTOCOL FOR AM LAB REPLACEMENT ONLY  The patient does apply for the [facility] Adult ICU Electrolyte Replacment Protocol based on the criteria listed below:   1. Is GFR >/= 40 ml/min? yes  Patient's GFR today is 75 2. Is urine output >/= 0.5 ml/kg/hr for the last 6 hours? yes Patient's UOP is 0.8 ml/kg/hr 3. Is BUN < 60 mg/dL? yes  Patient's BUN today is 16 4. Abnormal electrolyte(s):K3.2 5. Ordered repletion with:IV KCL 6. If a panic level lab has been reported, has the CCM MD in charge been notified? yes.   Physician:  Dr Estill Bakes, Charlynn Grimes 05/13/2013 5:48 AM

## 2013-05-13 NOTE — Progress Notes (Signed)
Subjective:  The patient remains alert and oriented.  Fever to 101.7 overnight.  The patient's norepinephrine was weaned off this morning.  Tube feeds were discontinued yesterday due to persistent episodes of nausea and vomiting.  Lasix was started yesterday, with good UOP of 2130 cc's.  Overnight, the patient had one episode of sinus bradycardia to the 40's, which quickly resolved.  No afib overnight.  Objective:  Vital Signs in the last 24 hours: Temp:  [98.6 F (37 C)-101.7 F (38.7 C)] 101.1 F (38.4 C) (05/26 0600) Pulse Rate:  [61-86] 80 (05/26 0600) Resp:  [11-21] 17 (05/26 0600) BP: (86-166)/(26-92) 110/45 mmHg (05/26 0600) SpO2:  [90 %-100 %] 91 % (05/26 0600) Arterial Line BP: (55-148)/(39-85) 114/49 mmHg (05/26 0600) FiO2 (%):  [40 %-50 %] 40 % (05/26 0404)  Intake/Output from previous day: 05/25 0701 - 05/26 0700 In: 1291 [I.V.:731; NG/GT:260; IV Piggyback:300] Out: 2130 [Urine:2130]  Physical Exam: General: alert, cooperative, lying in bed in NAD HEENT: pupils equal round and reactive to light, vision grossly intact, oropharynx clear and non-erythematous, ETT in place Neck: supple, no lymphadenopathy, no JVD Lungs: mechanical breath sounds, ronchi R > L Heart: regular rate and rhythm Abdomen: soft, non-tender, non-distended, normal bowel sounds Extremities: no edema Neurologic: alert & oriented X3, cranial nerves II-XII intact, strength grossly intact, sensation intact to light touch  Lab Results:  Recent Labs  05/12/13 0420 05/13/13 0430  WBC 18.3* 15.7*  HGB 11.0* 10.1*  PLT 171 196    Recent Labs  05/12/13 0420 05/13/13 0430  NA 130* 133*  K 4.1 3.2*  CL 97 96  CO2 26 28  GLUCOSE 130* 98  BUN 16 16  CREATININE 0.77 0.80   No results found for this basename: TROPONINI, CK, MB,  in the last 72 hours  Cardiac Studies: Echocardiogram 10/31/12 - Left ventricle: Systolic function was normal. The estimated ejection fraction was in the range of  60% to 65%. Doppler parameters are consistent with abnormal left ventricular relaxation (grade 1 diastolic dysfunction). - Aortic valve: Trivial regurgitation. - Mitral valve: Mild regurgitation.  Tele: Afib overnight with rates to 120's, currently in sinus brady in 50's  Assessment/Plan:  The patient is a 67 yo woman, history of HTN, NICM, LBBB, afib, presenting with dyspnea and hypoxia, found to be hypertensive with pulmonary edema.   # Acute Respiratory Failure/VDRF - The patient presented with dyspnea and acute respiratory failure, found to have diffuse pulmonary edema as well as a right-sided infiltrate concerning for aspiration pneumonia (witnessed vomiting by EMS).  CXR continues to show a large right-sided infiltrate consistent with aspiration pneumonia.  The patient was net negative 834 cc's yesterday after lasix, per I/O's, which will hopefully help towards the goal of extubation -abx per CCM, currently on vanc, ceftaz -continue lasix  # Atrial fibrillation - the patient has a history of afib with CHADS2-Vasc score of 5. Overnight, the patient went into afib with rate in 110-120's.  The patient is currently in sinus rhythm, HR in 70's.  Patient on amiodarone 5/23-5/25, then restarted home flecainide 5/25.  Norepi discontinued this morning. -continue flecainide -holding coreg due to low-normal BP's  # Hypotension - Resolved. the patient initially presented with elevated BP and pulmonary edema, thought to represent hypertensive emergency.  The patient was started on a nitro drip, but later had a PEA arrest, followed by hypotension, and the nitro drip was discontinued, and norepi was started 5/22, weaned off 5/26.  # EKG abnormalities -  the patient was initially called as a code STEMI, but further review of EKG showed only stable changes and old LBBB.  The patient notes no chest pain.  The patient had an exercise stress test 11/2012, which was stopped early due to fatigue, but showed no  acute abnormalities. Cardiac cath 04/22/04 showed 30% stenosis of LAD at that time.  # Hypothyroidism - the patient has a history of hypothyroidism -continue synthroid  # Hypokalemia/Hypomagnesemia - Repleted # Hyponatremia # History of breast cancer - s/p chemo, radiation  # Carotid Stenosis - 60-79% stenosis of L ICA seen on dopplers 02/12/13  Janalyn Harder, M.D. 05/13/2013, 6:23 AM   Attending Note:   The patient was seen and examined.  Agree with assessment and plan as noted above.  Changes made to the above note as needed.  Toneshia is waking up very well.  She is alert and responding to questions with nodding and shaking head and by writing on the dry erase board .  I expect she can be extubated today.    Vesta Mixer, Montez Hageman., MD, [facility] 05/13/2013, 9:03 AM

## 2013-05-14 ENCOUNTER — Inpatient Hospital Stay (HOSPITAL_COMMUNITY): Payer: Medicare Other

## 2013-05-14 LAB — CBC
Platelets: 206 10*3/uL (ref 150–400)
RBC: 3.01 MIL/uL — ABNORMAL LOW (ref 3.87–5.11)
WBC: 12.1 10*3/uL — ABNORMAL HIGH (ref 4.0–10.5)

## 2013-05-14 LAB — GLUCOSE, CAPILLARY
Glucose-Capillary: 113 mg/dL — ABNORMAL HIGH (ref 70–99)
Glucose-Capillary: 120 mg/dL — ABNORMAL HIGH (ref 70–99)
Glucose-Capillary: 72 mg/dL (ref 70–99)

## 2013-05-14 LAB — CULTURE, RESPIRATORY W GRAM STAIN

## 2013-05-14 LAB — MAGNESIUM: Magnesium: 1.9 mg/dL (ref 1.5–2.5)

## 2013-05-14 LAB — BASIC METABOLIC PANEL
Calcium: 8.1 mg/dL — ABNORMAL LOW (ref 8.4–10.5)
GFR calc non Af Amer: 88 mL/min — ABNORMAL LOW (ref >90)
Potassium: 3.1 mEq/L — ABNORMAL LOW (ref 3.5–5.1)
Sodium: 135 mEq/L (ref 135–145)

## 2013-05-14 MED ORDER — SODIUM CHLORIDE 0.9 % IV SOLN
3.0000 g | Freq: Four times a day (QID) | INTRAVENOUS | Status: DC
  Administered 2013-05-14 – 2013-05-16 (×8): 3 g via INTRAVENOUS
  Filled 2013-05-14 (×11): qty 3

## 2013-05-14 MED ORDER — POTASSIUM CHLORIDE 10 MEQ/50ML IV SOLN
INTRAVENOUS | Status: AC
  Administered 2013-05-14: 10 meq
  Filled 2013-05-14: qty 50

## 2013-05-14 MED ORDER — FUROSEMIDE 10 MG/ML IJ SOLN
40.0000 mg | Freq: Once | INTRAMUSCULAR | Status: AC
  Administered 2013-05-14: 40 mg via INTRAVENOUS

## 2013-05-14 MED ORDER — FUROSEMIDE 10 MG/ML IJ SOLN
INTRAMUSCULAR | Status: AC
  Administered 2013-05-14: 15:00:00
  Filled 2013-05-14: qty 8

## 2013-05-14 MED ORDER — POTASSIUM CHLORIDE 10 MEQ/50ML IV SOLN
10.0000 meq | INTRAVENOUS | Status: DC
  Administered 2013-05-14 (×3): 10 meq via INTRAVENOUS
  Filled 2013-05-14: qty 100
  Filled 2013-05-14 (×2): qty 50

## 2013-05-14 MED ORDER — POTASSIUM CHLORIDE 10 MEQ/50ML IV SOLN
10.0000 meq | INTRAVENOUS | Status: AC
  Administered 2013-05-14 (×4): 10 meq via INTRAVENOUS
  Filled 2013-05-14 (×3): qty 50

## 2013-05-14 MED ORDER — FENTANYL CITRATE 0.05 MG/ML IJ SOLN
12.5000 ug | INTRAMUSCULAR | Status: DC | PRN
  Administered 2013-05-15: 12.5 ug via INTRAVENOUS
  Administered 2013-05-15: 25 ug via INTRAVENOUS
  Administered 2013-05-15: 12.5 ug via INTRAVENOUS
  Administered 2013-05-16 (×2): 25 ug via INTRAVENOUS
  Filled 2013-05-14 (×6): qty 2

## 2013-05-14 NOTE — Progress Notes (Signed)
PULMONARY  / CRITICAL CARE MEDICINE  Name: [name] MRN: [id number] DOB: [birth year]    ADMISSION DATE:  05/09/2013 CONSULTATION DATE:  05/09/13  REFERRING MD :  EDP - Dr. Bebe Shaggy PRIMARY SERVICE: PCCM   BRIEF PATIENT DESCRIPTION: [name] who presented to [facility] ER on 5/22 with hypertensive crisis, pulmonary edema and subsequent cardiac arrest (PEA initial rhythm). RLL consolidation and strep Ag positive  SIGNIFICANT EVENTS / STUDIES:  5/22 - Admit with Hypertensive Crisis, Cardiac Arrest, Pulmonary Edema, PNA 5/22 CT head: NAD 5/22 CT chest: Bilateral pleural effusions. Peribronchial cuffing and areas of scattered ground-glass opacity, right lower lobe predominant, may reflect infection or edema 5/23 Echo: EF 55-60%  LINES / TUBES: L [place] Portacath 04/16/12 ETT 5/22 >> 5/27 R IJ TLC 5/22 >>  R Fem A-line 5/22 >> 5/26  CULTURES: BCx2 5/22 >> NEG Sputum 5/24 >> NOF Urine 5/22, 5/23 >> NEG U. Strep 5/22>>>POS U. Legionella 5/22>>>neg  ANTIBIOTICS: Vanco 5/22>>>5/27 Ceftaz 5/22>>>5/27 Unasyn 5/27 >>     SUBJECTIVE:  Passed SBT and extubated this AM. Initially looked very good but subsequently developed acute resp distress with diffuse coarse crackles and rhonchi. Improved with BiPAP and Lasix  VITAL SIGNS: Temp:  [98.2 F (36.8 C)-101.5 F (38.6 C)] 99.9 F (37.7 C) (05/27 2200) Pulse Rate:  [71-108] 80 (05/27 2200) Resp:  [13-29] 24 (05/27 2200) BP: (96-200)/(30-131) 133/54 mmHg (05/27 2200) SpO2:  [90 %-100 %] 97 % (05/27 2200) FiO2 (%):  [40 %-100 %] 50 % (05/27 2010) Weight:  [67.4 kg (148 lb 9.4 oz)] 67.4 kg (148 lb 9.4 oz) (05/27 0500)  HEMODYNAMICS: CVP:  [10 mmHg-14 mmHg] 10 mmHg  VENTILATOR SETTINGS: Vent Mode:  [-] BIPAP FiO2 (%):  [40 %-100 %] 50 % Set Rate:  [10 bmp-18 bmp] 10 bmp Vt Set:  [500 mL] 500 mL PEEP:  [5 cmH20] 5 cmH20 Pressure Support:  [5 cmH20-10 cmH20] 5 cmH20 Plateau Pressure:  [11 cmH20-23 cmH20] 11 cmH20  INTAKE /  OUTPUT: Intake/Output     05/27 0701 - 05/28 0700   P.O. 60   I.V. (mL/kg) 300 (4.5)   NG/GT 215   IV Piggyback 350   Total Intake(mL/kg) 925 (13.7)   Urine (mL/kg/hr) 2160 (2.1)   Total Output 2160   Net -1235         PHYSICAL EXAMINATION: General: RASS 0, CAM-ICU neg Neuro: No focal deficits HEENT:  WNL Cardiovascular: RRR s M Lungs: diffuse rhonchi Abdomen: soft, NT, NABS Ext: warm, no edema  LABS:  Recent Labs Lab 05/09/13 1648 05/09/13 1651  05/09/13 1954  05/09/13 2109 05/09/13 2200 05/10/13 0440 05/10/13 0455 05/10/13 1803 05/11/13 0240 05/11/13 0323 05/12/13 0305 05/12/13 0420 05/13/13 0430 05/14/13 0355 05/14/13 1555  HGB 14.3  --   --   --   --   --   --   --  14.3  --  13.1  --   --  11.0* 10.1* 9.5*  --   WBC 7.8  --   --   --   --   --   --   --  17.3*  --  20.4*  --   --  18.3* 15.7* 12.1*  --   PLT 274  --   --   --   --   --   --   --  254  --  201  --   --  171 196 206  --   NA 131*  --   --   --   --   --   --   --  135 135 132*  --   --  130* 133* 135  --   K 4.7  --   --   --   --   --   --   --  3.5 3.6 3.5  --   --  4.1 3.2* 3.1*  --   CL 95*  --   --   --   --   --   --   --  97 101 97  --   --  97 96 97  --   CO2 20  --   --   --   --   --   --   --  26 23 24   --   --  26 28 30   --   GLUCOSE 229*  --   --   --   --   --   --   --  130* 139* 171*  --   --  130* 98 125*  --   BUN 23  --   --   --   --   --   --   --  24* 22 21  --   --  16 16 20   --   CREATININE 0.76  --   --   --   --   --   --   --  0.79 0.86 0.97  --   --  0.77 0.80 0.70  --   CALCIUM 8.9  --   --   --   --   --   --   --  8.8 7.5* 7.5*  --   --  7.9* 8.4 8.1*  --   MG  --   --   --   --   --   --   --   --  1.7 1.8  --   --   --   --   --  1.9  --   PHOS  --   --   --   --   --   --   --   --  4.7* 3.8  --   --   --   --   --   --   --   AST 26  --   --   --   --   --   --   --   --   --   --   --   --   --   --   --   --   ALT 18  --   --   --   --   --   --   --   --    --   --   --   --   --   --   --   --   ALKPHOS 142*  --   --   --   --   --   --   --   --   --   --   --   --   --   --   --   --   BILITOT 0.3  --   --   --   --   --   --   --   --   --   --   --   --   --   --   --   --   PROT 6.0  --   --   --   --   --   --   --   --   --   --   --   --   --   --   --   --  ALBUMIN 3.1*  --   --   --   --   --   --   --   --   --   --   --   --   --   --   --   --   APTT 28  --   --   --   --   --   --   --   --   --   --   --   --   --   --   --   --   INR 1.01  --   --   --   --   --   --   --   --   --   --   --   --   --   --   --   --   TROPONINI  --   --   --   --   --   --  <0.30  --  <0.30  --   --   --   --   --   --   --   --   PROCALCITON  --   --   --  <0.10  --   --   --   --  0.52  --  0.49  --   --   --   --   --   --   PROBNP  --  591.0*  --   --   --   --   --   --   --   --   --   --   --   --   --   --  1751.0*  PHART  --   --   < >  --   < > 7.412  --  7.413  --   --   --  7.364 7.432  --   --   --   --   PCO2ART  --   --   < >  --   < > 40.6  --  43.2  --   --   --  48.7* 40.1  --   --   --   --   PO2ART  --   --   < >  --   < > 73.0*  --  97.0  --   --   --  84.0 74.3*  --   --   --   --   < > = values in this interval not displayed.  Recent Labs Lab 05/14/13 0400 05/14/13 0902 05/14/13 1128 05/14/13 1535 05/14/13 2155  GLUCAP 113* 120* 115* 143* 72    CXR: bilateral LL AS dz, R>L  ASSESSMENT / PLAN:  PULMONARY A:  Acute Respiratory Failure - multifactorial. Suspect PNA primary process Pulmonary Edema - in setting of hypertensive crisis Aspiration PNA/ ALI -  Strep Ag positive Resp distress post extubation  P:   PRN BiPAP Cont BDs Lasix X 1 Cont abx   CARDIOVASCULAR A:  Hypertensive Emergency  Cardiac Arrest on admission - likely secondary to resp distress - PEA initial rhythm, required 7 min CPR in ER H/O LBBB Afib - hx of   P:  -PRN Hydralazine Cont flecainide  RENAL A:   Hypokalemia  P:    -Replete   GASTROINTESTINAL A:  No issues  P:   -Cont SUP as indicated -Advance nutrition when able   HEMATOLOGIC / ONC  A:   Hx of Breast CA / Hodgkins - s/p last Rx in 2013 - chemo RT to chest wall recurrence P:  sub q hep   INFECTIOUS A:   Pneumococcal PNA Aspiration PNA - presumed with multiple episodes of vomiting  P:   -abx as above   ENDOCRINE A:   Hyperglycemia  - in setting of arrest. Resolved Hypothyroidisim -TSH 7 P:   -SSI no longer indicated -continue synthroid  NEUROLOGIC A:   Acute Encephalopathy - in setting of arrest, resolved  P:   -Minimize sedation   I have personally obtained a history, examined the patient, evaluated laboratory and imaging results, formulated the assessment and plan and placed orders.  CRITICAL CARE: The patient is critically ill with multiple organ systems failure and requires high complexity decision making for assessment and support, frequent evaluation and titration of therapies, application of advanced monitoring technologies and extensive interpretation of multiple databases. Critical Care Time devoted to patient care services described in this note is 45 minutes.   Billy Fischer, MD ; [facility] [phone].  After 5:30 PM or weekends, call [phone]  05/14/2013, 10:18 PM

## 2013-05-14 NOTE — Progress Notes (Signed)
Subjective:  The patient remains alert and oriented.  Fever to 101.5 yesterday.  Tube feeds were restarted yesterday, and the patient has had no nausea or vomiting overnight.  The patient failed SBT yesterday due to desaturations.  The patient is in sinus rhythm this morning.  Objective:  Vital Signs in the last 24 hours: Temp:  [98.6 F (37 C)-101.5 F (38.6 C)] 101.1 F (38.4 C) (05/27 0336) Pulse Rate:  [75-123] 77 (05/27 0336) Resp:  [9-28] 18 (05/27 0336) BP: (86-171)/(32-68) 96/34 mmHg (05/27 0336) SpO2:  [84 %-100 %] 93 % (05/27 0336) Arterial Line BP: (100-157)/(43-65) 100/65 mmHg (05/26 1500) FiO2 (%):  [40 %-60 %] 40 % (05/27 0336) Weight:  [148 lb 9.4 oz (67.4 kg)] 148 lb 9.4 oz (67.4 kg) (05/27 0500)  Intake/Output from previous day: 05/26 0701 - 05/27 0700 In: 1462.5 [I.V.:572.5; NG/GT:490; IV Piggyback:400] Out: 1860 [Urine:1860]  Physical Exam: General: alert, cooperative, lying in bed in NAD HEENT: pupils equal round and reactive to light, vision grossly intact, oropharynx clear and non-erythematous, ETT in place Neck: supple, no lymphadenopathy, no JVD Lungs: mechanical breath sounds, ronchi R > L Heart: regular rate and rhythm Abdomen: soft, non-tender, non-distended, normal bowel sounds Extremities: no edema Neurologic: alert & oriented X3, cranial nerves II-XII intact, strength grossly intact, sensation intact to light touch  Lab Results:  Recent Labs  05/13/13 0430 05/14/13 0355  WBC 15.7* 12.1*  HGB 10.1* 9.5*  PLT 196 206    Recent Labs  05/13/13 0430 05/14/13 0355  NA 133* 135  K 3.2* 3.1*  CL 96 97  CO2 28 30  GLUCOSE 98 125*  BUN 16 20  CREATININE 0.80 0.70   No results found for this basename: TROPONINI, CK, MB,  in the last 72 hours  Cardiac Studies: Echocardiogram 10/31/12 - Left ventricle: Systolic function was normal. The estimated ejection fraction was in the range of 60% to 65%. Doppler parameters are consistent with  abnormal left ventricular relaxation (grade 1 diastolic dysfunction). - Aortic valve: Trivial regurgitation. - Mitral valve: Mild regurgitation.  Tele: NSR  Assessment/Plan:  The patient is a 67 yo woman, history of HTN, NICM, LBBB, afib, presenting with dyspnea and hypoxia, found to be hypertensive with pulmonary edema.   # Acute Respiratory Failure/VDRF - The patient presented with dyspnea and acute respiratory failure, found to have diffuse pulmonary edema as well as a right-sided infiltrate concerning for aspiration pneumonia (witnessed vomiting by EMS).  CXR continues to show a large right-sided infiltrate consistent with aspiration pneumonia.  The patient was net negative 30 cc's yesterday after lasix, per I/O's, and appears euvolemic. -abx per CCM, currently on vanc, ceftaz -consider discontinuing lasix  # Atrial fibrillation - the patient has a history of afib with CHADS2-Vasc score of 5. The patient has been in sinus rhythm overnight.  Patient on amiodarone 5/23-5/25, then restarted home flecainide 5/25.  Norepi discontinued 5/26. -continue flecainide -holding coreg due to low-normal BP's  # Hypotension - Resolved. the patient initially presented with elevated BP and pulmonary edema, thought to represent hypertensive emergency.  The patient was started on a nitro drip, but later had a PEA arrest, followed by hypotension, and the nitro drip was discontinued, and norepi was started 5/22, weaned off 5/26.  # EKG abnormalities - the patient was initially called as a code STEMI, but further review of EKG showed only stable changes and old LBBB.  The patient notes no chest pain.  The patient had an exercise  stress test 11/2012, which was stopped early due to fatigue, but showed no acute abnormalities. Cardiac cath 04/22/04 showed 30% stenosis of LAD at that time.  # Hypothyroidism - the patient has a history of hypothyroidism -continue synthroid  # Hypokalemia - Repleted # Hyponatremia -  Resolved # History of breast cancer - s/p chemo, radiation  # Carotid Stenosis - 60-79% stenosis of L ICA seen on dopplers 02/12/13  Janalyn Harder, M.D. 05/14/2013, 6:17 AM   Patient examined and agree except changes made. If recurrent AFib, will discontinue Flecainide and restart Amiodarone.   Valera Castle, MD 05/14/2013 9:08 AM

## 2013-05-14 NOTE — Plan of Care (Signed)
Problem: Phase II Progression Outcomes Goal: Time pt extubated/weaned off vent Outcome: Completed/Met Date Met:  05/14/13 At about noon

## 2013-05-14 NOTE — Progress Notes (Signed)
Dr Sung Amabile notified of patients new onset of extreme SHOB, O2 sat in 70's despite 50% venturi mask, HR 100, and bilateral crackles heard throughout. Instructed to place 100% NRB mask and Dr Park Breed coming to bedside in 15 minutes.   Dawson Bills, RN

## 2013-05-14 NOTE — Progress Notes (Signed)
Fentanyl 235 mls wasted in sink and witnessed by Luther Parody, Charity fundraiser.

## 2013-05-14 NOTE — Progress Notes (Signed)
Dr Sung Amabile at bedside placing new orders. Patient placed on BIPAP, EKG obtained, and Lasix 40mg  IV given.   Dawson Bills, RN

## 2013-05-14 NOTE — Progress Notes (Signed)
eLink Physician-Brief Progress Note Patient Name: [name] DOB: [birth year] MRN: [id number]  Date of Service  05/14/2013   HPI/Events of Note  Hypokalemia   eICU Interventions  Potassium replaced   Intervention Category Intermediate Interventions: Electrolyte abnormality - evaluation and management  [name] 05/14/2013, 5:38 AM

## 2013-05-14 NOTE — Evaluation (Signed)
Physical Therapy Evaluation Patient Details Name: MARLIN BRYS MRN: [id number] DOB: [birth year] Today's Date: 05/14/2013 Time: 1026-1050 PT Time Calculation (min): 24 min  PT Assessment / Plan / Recommendation Clinical Impression  Pt is a 67 yo female admitted for HTN crisis and pulmonary edema who remains on the vent however is alert and able to participate in PT. Pt OOB mobility greatly limited by dec in SpO2 despite being on the vent. Anticpate once patient achieve medically stability she will be safe for d/c home with spouse and possibly HHPT. PT to con't to work with patient and re-assess d/c recommendation once patient extubated and tolerating OOB mobility.    PT Assessment  Patient needs continued PT services    Follow Up Recommendations  Home health PT;Supervision/Assistance - 24 hour    Does the patient have the potential to tolerate intense rehabilitation      Barriers to Discharge None      Equipment Recommendations   (TBA)    Recommendations for Other Services     Frequency Min 3X/week    Precautions / Restrictions Precautions Precautions: Fall Restrictions Weight Bearing Restrictions: No   Pertinent Vitals/Pain Denies pain SpO2 dec to 84% on vent once transferred to EOB      Mobility  Bed Mobility Bed Mobility: Supine to Sit;Sitting - Scoot to Edge of Bed Supine to Sit: 4: Min assist;HOB elevated;With rails (line management and v/c's for sequencing) Sitting - Scoot to Edge of Bed: 4: Min guard (line management and v/c's for sequencing) Details for Bed Mobility Assistance: Pt quickly fatigued with bed mobility, initiated return to supine after 1 min Transfers Transfers: Not assessed (due pt dec in SpO2 to 84% sitting EOB) Ambulation/Gait Ambulation/Gait Assistance: Not tested (comment) Wheelchair Mobility Wheelchair Mobility: No    Exercises     PT Diagnosis: Generalized weakness  PT Problem List: Decreased strength;Decreased activity  tolerance;Decreased balance;Decreased mobility PT Treatment Interventions: DME instruction;Gait training;Stair training;Functional mobility training;Therapeutic activities;Therapeutic exercise   PT Goals Acute Rehab PT Goals PT Goal Formulation: With patient Time For Goal Achievement: 05/28/13 Potential to Achieve Goals: Good Pt will go Supine/Side to Sit: with modified independence PT Goal: Supine/Side to Sit - Progress: Goal set today Pt will go Sit to Stand: with min assist;with upper extremity assist PT Goal: Sit to Stand - Progress: Goal set today Pt will Transfer Bed to Chair/Chair to Bed: with min assist PT Transfer Goal: Bed to Chair/Chair to Bed - Progress: Goal set today Pt will Stand: with min assist;1 - 2 min;with bilateral upper extremity support PT Goal: Stand - Progress: Goal set today Pt will Ambulate: 16 - 50 feet;with min assist;with least restrictive assistive device PT Goal: Ambulate - Progress: Goal set today Pt will Go Up / Down Stairs: 3-5 stairs;with min assist;with rail(s) PT Goal: Up/Down Stairs - Progress: Goal set today Pt will Perform Home Exercise Program: Independently PT Goal: Perform Home Exercise Program - Progress: Goal set today  Visit Information  Last PT Received On: 05/14/13 Assistance Needed: +2 (for line management OOB)    Subjective Data  Subjective: Pt recieved in bed, agreeable to sitting EOB   Prior Functioning  Home Living Lives With: Spouse Available Help at Discharge: Family;Available 24 hours/day Type of Home: House Home Access: Stairs to enter [employer] of Steps: 2 Entrance Stairs-Rails: Can reach both Home Layout: One level Bathroom Shower/Tub: Health visitor: Handicapped height Home Adaptive Equipment: None Prior Function Level of Independence: Independent Able to Take Stairs?: Yes  Driving: Yes 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) Dominant Hand: Left    Cognition  Cognition Arousal/Alertness: Awake/alert Behavior During Therapy: WFL for tasks assessed/performed Overall Cognitive Status: Within Functional Limits for tasks assessed Difficult to assess due to: Intubated    Extremity/Trunk Assessment Right Upper Extremity Assessment RUE ROM/Strength/Tone: WFL for tasks assessed RUE Sensation:  (pt reports no numbness or tingling) Left Upper Extremity Assessment LUE ROM/Strength/Tone: WFL for tasks assessed Right Lower Extremity Assessment RLE ROM/Strength/Tone: WFL for tasks assessed Left Lower Extremity Assessment LLE ROM/Strength/Tone: WFL for tasks assessed   Balance Balance Balance Assessed: Yes Static Sitting Balance Static Sitting - Balance Support: Bilateral upper extremity supported Static Sitting - Level of Assistance: 4: Min assist Static Sitting - Comment/# of Minutes: 1 min, pt quickly fatigued and experiencing discomfort with vent tube in sitting  End of Session PT - End of Session Equipment Utilized During Treatment:  (vented) Activity Tolerance: Patient limited by fatigue (pt reports "I didn't know how heavy I was") Patient left: in bed;with call bell/phone within reach Nurse Communication: Mobility status (dec in SpO2 with sitting EOB to 84% while on vent)  GP     Mishaal Lansdale Marie 05/14/2013, 11:10 AM  Lewis Shock, PT, DPT Pager #: [phone] Office #: [phone]

## 2013-05-14 NOTE — Procedures (Signed)
Extubation Procedure Note  Patient Details:   Name: [name] DOB: 1946-10-06 MRN: [id number]   Airway Documentation:  AIRWAYS (Active)     Airway 7.5 mm (Active)  Secured at (cm) 23 cm 05/14/2013 11:46 AM  Measured From Lips 05/14/2013 11:46 AM  Secured Location Right 05/14/2013 11:46 AM  Secured By [employer] 05/14/2013 11:46 AM  Tube Holder Repositioned Yes 05/14/2013 11:46 AM  Cuff Pressure (cm H2O) 25 cm H2O 05/13/2013  8:20 PM  Site Condition Dry 05/14/2013  3:36 AM    Evaluation  O2 sats: stable throughout Complications: No apparent complications Patient did tolerate procedure well. Bilateral Breath Sounds: Diminished Suctioning: Airway Yes  Vitals WNL.   Devra Dopp D 05/14/2013, 12:01 PM

## 2013-05-15 ENCOUNTER — Inpatient Hospital Stay (HOSPITAL_COMMUNITY): Payer: Medicare Other

## 2013-05-15 DIAGNOSIS — J90 Pleural effusion, not elsewhere classified: Secondary | ICD-10-CM

## 2013-05-15 LAB — BODY FLUID CELL COUNT WITH DIFFERENTIAL
Neutrophil Count, Fluid: 83 % — ABNORMAL HIGH (ref 0–25)
Total Nucleated Cell Count, Fluid: 267 cu mm (ref 0–1000)

## 2013-05-15 LAB — PROTEIN, TOTAL: Total Protein: 6.1 g/dL (ref 6.0–8.3)

## 2013-05-15 LAB — BASIC METABOLIC PANEL
BUN: 19 mg/dL (ref 6–23)
CO2: 33 mEq/L — ABNORMAL HIGH (ref 19–32)
GFR calc non Af Amer: >90 mL/min (ref >90)
Glucose, Bld: 103 mg/dL — ABNORMAL HIGH (ref 70–99)
Potassium: 3.9 mEq/L (ref 3.5–5.1)
Sodium: 139 mEq/L (ref 135–145)

## 2013-05-15 LAB — GLUCOSE, CAPILLARY
Glucose-Capillary: 90 mg/dL (ref 70–99)
Glucose-Capillary: 99 mg/dL (ref 70–99)

## 2013-05-15 LAB — LACTATE DEHYDROGENASE: LDH: 182 U/L (ref 94–250)

## 2013-05-15 LAB — LACTATE DEHYDROGENASE, PLEURAL OR PERITONEAL FLUID: LD, Fluid: 96 U/L — ABNORMAL HIGH (ref 3–23)

## 2013-05-15 LAB — CBC
Hemoglobin: 11 g/dL — ABNORMAL LOW (ref 12.0–15.0)
MCH: 31.5 pg (ref 26.0–34.0)
MCHC: 34.4 g/dL (ref 30.0–36.0)
MCV: 91.7 fL (ref 78.0–100.0)
RBC: 3.49 MIL/uL — ABNORMAL LOW (ref 3.87–5.11)

## 2013-05-15 MED ORDER — CARVEDILOL 3.125 MG PO TABS
3.1250 mg | ORAL_TABLET | Freq: Two times a day (BID) | ORAL | Status: DC
  Administered 2013-05-15 (×2): 3.125 mg via ORAL
  Filled 2013-05-15 (×5): qty 1

## 2013-05-15 MED ORDER — ENSURE COMPLETE PO LIQD
237.0000 mL | Freq: Two times a day (BID) | ORAL | Status: DC
  Administered 2013-05-15 – 2013-05-22 (×5): 237 mL via ORAL

## 2013-05-15 MED ORDER — BIOTENE DRY MOUTH MT LIQD
15.0000 mL | Freq: Two times a day (BID) | OROMUCOSAL | Status: DC
  Administered 2013-05-15 – 2013-05-23 (×13): 15 mL via OROMUCOSAL

## 2013-05-15 MED ORDER — ALPRAZOLAM 0.25 MG PO TABS
0.2500 mg | ORAL_TABLET | Freq: Three times a day (TID) | ORAL | Status: DC | PRN
  Administered 2013-05-15 – 2013-05-19 (×6): 0.25 mg via ORAL
  Filled 2013-05-15 (×8): qty 1

## 2013-05-15 MED ORDER — FUROSEMIDE 10 MG/ML IJ SOLN
40.0000 mg | Freq: Once | INTRAMUSCULAR | Status: AC
  Administered 2013-05-15: 40 mg via INTRAVENOUS
  Filled 2013-05-15: qty 4

## 2013-05-15 MED ORDER — ACETAMINOPHEN 325 MG PO TABS
650.0000 mg | ORAL_TABLET | ORAL | Status: DC | PRN
  Administered 2013-05-15 – 2013-05-22 (×17): 650 mg via ORAL
  Filled 2013-05-15: qty 2
  Filled 2013-05-15: qty 1
  Filled 2013-05-15 (×12): qty 2
  Filled 2013-05-15: qty 1
  Filled 2013-05-15 (×3): qty 2

## 2013-05-15 MED ORDER — POTASSIUM CHLORIDE CRYS ER 20 MEQ PO TBCR
40.0000 meq | EXTENDED_RELEASE_TABLET | Freq: Once | ORAL | Status: AC
  Administered 2013-05-15: 40 meq via ORAL
  Filled 2013-05-15: qty 2

## 2013-05-15 NOTE — Progress Notes (Signed)
PULMONARY  / CRITICAL CARE MEDICINE  Name: [name] MRN: [id number] DOB: October 03, 1946    ADMISSION DATE:  05/09/2013 CONSULTATION DATE:  05/09/13  REFERRING MD :  EDP - Dr. Bebe Shaggy PRIMARY SERVICE: PCCM   BRIEF PATIENT DESCRIPTION: [name] who presented to [facility] ER on 5/22 with hypertensive crisis, pulmonary edema and subsequent cardiac arrest (PEA initial rhythm). RLL consolidation and strep Ag positive  SIGNIFICANT EVENTS / STUDIES:  5/22 - Admit with Hypertensive Crisis, Cardiac Arrest, Pulmonary Edema, PNA 5/22 CT head: NAD 5/22 CT chest: Bilateral pleural effusions. Peribronchial cuffing and areas of scattered ground-glass opacity, right lower lobe predominant, may reflect infection or edema 5/23 Echo: EF 55-60% 5/28 R thoracentesis. 1300 clear yellow fluid   LINES / TUBES: L [place] Portacath 04/16/12 ETT 5/22 >> 5/27 R IJ TLC 5/22 >>  R Fem A-line 5/22 >> 5/26  CULTURES: BCx2 5/22 >> NEG Sputum 5/24 >> NOF Urine 5/22, 5/23 >> NEG U. Strep 5/22>>>POS U. Legionella 5/22>>>neg R pleural fluid 5/28 >>    ANTIBIOTICS: Vanco 5/22>>>5/27 Ceftaz 5/22>>>5/27 Unasyn 5/27 >>    SUBJECTIVE:  Intermittently BiPAP dependent. Intermittently anxious. Cognition intact  VITAL SIGNS: Temp:  [98.2 F (36.8 C)-99.9 F (37.7 C)] 99.5 F (37.5 C) (05/28 0900) Pulse Rate:  [77-107] 90 (05/28 1500) Resp:  [15-31] 31 (05/28 1500) BP: (84-206)/(39-129) 137/110 mmHg (05/28 1500) SpO2:  [93 %-100 %] 96 % (05/28 1500) FiO2 (%):  [0.5 %-60 %] 0.5 % (05/28 1400)  HEMODYNAMICS: CVP:  [12 mmHg] 12 mmHg  VENTILATOR SETTINGS: Vent Mode:  [-] BIPAP FiO2 (%):  [0.5 %-60 %] 0.5 % Set Rate:  [10 bmp] 10 bmp  INTAKE / OUTPUT: Intake/Output     05/27 0701 - 05/28 0700 05/28 0701 - 05/29 0700   P.O. 60    I.V. (mL/kg) 460 (6.8) 40 (0.6)   NG/GT 215    IV Piggyback 350 200   Total Intake(mL/kg) 1085 (16.1) 240 (3.6)   Urine (mL/kg/hr) 2460 (1.5) 1850 (3.1)   Total Output 2460 1850   Net -1375 -1610          PHYSICAL EXAMINATION: General: RASS 0, CAM-ICU neg Neuro: No focal deficits HEENT:  WNL Cardiovascular: RRR s M Lungs: dull with diminished BS in B bases R>L Abdomen: soft, NT, NABS Ext: warm, no edema  LABS: BMET    Component Value Date/Time   NA 139 05/15/2013 0428   NA 143 09/05/2012 1438   K 3.9 05/15/2013 0428   K 4.8 09/05/2012 1438   CL 98 05/15/2013 0428   CL 104 09/05/2012 1438   CO2 33* 05/15/2013 0428   CO2 31* 09/05/2012 1438   GLUCOSE 103* 05/15/2013 0428   GLUCOSE 125* 09/05/2012 1438   BUN 19 05/15/2013 0428   BUN 16.0 09/05/2012 1438   CREATININE 0.55 05/15/2013 0428   CREATININE 0.8 09/05/2012 1438   CALCIUM 9.1 05/15/2013 0428   CALCIUM 9.4 09/05/2012 1438   GFRNONAA >90 05/15/2013 0428   GFRAA >90 05/15/2013 0428    CBC    Component Value Date/Time   WBC 15.6* 05/15/2013 0428   WBC 7.1 09/05/2012 1437   RBC 3.49* 05/15/2013 0428   RBC 4.02 09/05/2012 1437   HGB 11.0* 05/15/2013 0428   HGB 12.3 09/05/2012 1437   HCT 32.0* 05/15/2013 0428   HCT 37.9 09/05/2012 1437   PLT 275 05/15/2013 0428   PLT 255 09/05/2012 1437   MCV 91.7 05/15/2013 0428   MCV 94.4 09/05/2012 1437  MCH 31.5 05/15/2013 0428   MCH 30.5 09/05/2012 1437   MCHC 34.4 05/15/2013 0428   MCHC 32.3 09/05/2012 1437   RDW 14.8 05/15/2013 0428   RDW 14.2 09/05/2012 1437   LYMPHSABS 1.5 05/09/2013 1648   LYMPHSABS 1.5 09/05/2012 1437   MONOABS 0.4 05/09/2013 1648   MONOABS 0.7 09/05/2012 1437   EOSABS 0.2 05/09/2013 1648   EOSABS 0.6* 09/05/2012 1437   BASOSABS 0.0 05/09/2013 1648   BASOSABS 0.1 09/05/2012 1437      Recent Labs Lab 05/14/13 1535 05/14/13 2155 05/14/13 2350 05/15/13 0409 05/15/13 0733  GLUCAP 143* 72 86 90 99    CXR: NSC bilateral LL AS dz, R>L  ASSESSMENT / PLAN:  PULMONARY A:  Acute Respiratory Failure - multifactorial. Suspect PNA primary process Pulmonary Edema - in setting of hypertensive crisis Aspiration PNA/ ALI -  Strep Ag positive Resp distress  post extubation  P:   Cont PRN BiPAP Repeat Lasix X 1 Cont abx for now [place] R hemithorax to assess for effusion - tap if large  CARDIOVASCULAR A:  Hypertensive Emergency, resolved Cardiac Arrest on admission - likely secondary to resp distress - PEA initial rhythm, required 7 min CPR in ER H/O LBBB Afib - hx of   P:  -Cont PRN Hydralazine Cont flecainide  RENAL A:   Hypokalemia, resolved  P:   -Replete as indicated   GASTROINTESTINAL A:  No issues  P:   -SUP not indicated -Advance nutrition    HEMATOLOGIC / ONC A:   Hx of Breast CA / Hodgkins - s/p last Rx in 2013 - chemo RT to chest wall recurrence P:  Cont sub q hep   INFECTIOUS A:   Pneumococcal PNA Aspiration PNA - presumed with multiple episodes of vomiting  P:   -abx as above   ENDOCRINE A:   Hyperglycemia  - in setting of arrest. Resolved Hypothyroidisim -TSH 7 P:   -SSI no longer indicated -continue synthroid  NEUROLOGIC A:   Acute Encephalopathy - in setting of arrest, resolved Anxiety P:   -PRN Xanax    Billy Fischer, MD ; [employer] service Mobile [phone].  After 5:30 PM or weekends, call [phone]  05/15/2013, 3:50 PM

## 2013-05-15 NOTE — Procedures (Signed)
Thoracentesis Procedure Note  Pre-operative Diagnosis: Rt effusion  Post-operative Diagnosis: same  Indications: Dyspnea  Procedure Details  Consent: Informed consent was obtained. Risks of the procedure were discussed including: infection, bleeding, pain, pneumothorax.  Under sterile conditions the patient was positioned. Betadine solution and sterile drapes were utilized.  1% buffered lidocaine was used to anesthetize the 6th rib space. Fluid was obtained without any difficulties and minimal blood loss.  A dressing was applied to the wound and wound care instructions were provided.   Findings 1300 ml of clear pleural fluid was obtained. A sample was sent to Pathology for cytogenetics, flow, and cell counts, as well as for infection analysis.  Complications:  None; patient tolerated the procedure well.          Condition: stable  Plan A follow up chest x-ray was ordered. Bed Rest for 2 hours. Tylenol 650 mg. for pain.  Attending Attestation: I was present for the entire procedure.  Brett Canales Minor ACNP [facility] PCCM Pager [phone] till 3 pm If no answer page [phone] 05/15/2013, 1:58 PM    I was present for and supervised the entire procedure  Billy Fischer, MD ; [facility] service Mobile [phone].  After 5:30 PM or weekends, call [phone]

## 2013-05-15 NOTE — Progress Notes (Signed)
NUTRITION FOLLOW UP  Intervention:   1. Ensure Complete po BID, each supplement provides 350 kcal and 13 grams of protein.  2. Pt had not had a bowel movement this admission, may benefit from addition of a bowel regimen.   Nutrition Dx:   Inadequate oral intake now related to limited ability to eat as evidenced by Bipap.   Goal:   Meet >/=90% estimated nutrition needs. unmet  Monitor:   PO intake, diet advance, weight trends, labs   Assessment:   Pt tolerated TF once they were restarted with out vomiting. Pt was extubated on 5/27 but developed acute respiratory distress and required Bipap and Lasix. Continues on Bipap at this time. Diet has been advanced to full liquids.  Pt states she has had some sips of liquids, with meds mostly. Has not had a bowel movement this admission.  Height: Ht Readings from Last 1 Encounters:  05/09/13 5' 2.99" (1.6 m)    Weight Status:   Wt Readings from Last 1 Encounters:  05/14/13 148 lb 9.4 oz (67.4 kg)    Re-estimated needs:  Kcal: 1400-1600 Protein: 70-80 gm  Fluid: 1.4-1.6 L   Skin: intact   Diet Order: Full Liquid   Intake/Output Summary (Last 24 hours) at 05/15/13 1220 Last data filed at 05/15/13 0900  Gross per 24 hour  Intake    780 ml  Output   2325 ml  Net  -1545 ml    Last BM: none documented   Labs:   Recent Labs Lab 05/09/13 1648 05/10/13 0455 05/10/13 1803  05/13/13 0430 05/14/13 0355 05/15/13 0428  NA 131* 135 135  < > 133* 135 139  K 4.7 3.5 3.6  < > 3.2* 3.1* 3.9  CL 95* 97 101  < > 96 97 98  CO2 20 26 23   < > 28 30 33*  BUN 23 24* 22  < > 16 20 19   CREATININE 0.76 0.79 0.86  < > 0.80 0.70 0.55  CALCIUM 8.9 8.8 7.5*  < > 8.4 8.1* 9.1  MG  --  1.7 1.8  --   --  1.9  --   PHOS  --  4.7* 3.8  --   --   --   --   GLUCOSE 229* 130* 139*  < > 98 125* 103*  < > = values in this interval not displayed.  CBG (last 3)   Recent Labs  05/14/13 2350 05/15/13 0409 05/15/13 0733  GLUCAP 86 90 99     Scheduled Meds: . ampicillin-sulbactam (UNASYN) IV  3 g Intravenous Q6H  . antiseptic oral rinse  15 mL Mouth Rinse QID  . carvedilol  3.125 mg Oral BID WC  . chlorhexidine  15 mL Mouth Rinse BID  . flecainide  50 mg Oral Q12H  . heparin subcutaneous  5,000 Units Subcutaneous Q8H  . levothyroxine  125 mcg Oral QAC breakfast    Continuous Infusions: . sodium chloride 10 mL/hr (05/14/13 2200)    Clarene Duke RD, LDN Pager [phone] After Hours pager [phone]

## 2013-05-15 NOTE — Progress Notes (Signed)
Subjective:  The patient was extubated yesterday, though was placed on bipap due to desaturations on NRB.  Telemetry shows sinus rhythm overnight, with HR mostly in 80's.  BP somewhat elevated today.  Objective:  Vital Signs in the last 24 hours: Temp:  [98.2 F (36.8 C)-100.6 F (38.1 C)] 99.1 F (37.3 C) (05/28 0600) Pulse Rate:  [71-108] 79 (05/28 0600) Resp:  [13-29] 17 (05/28 0600) BP: (98-200)/(30-131) 151/53 mmHg (05/28 0600) SpO2:  [90 %-100 %] 98 % (05/28 0600) FiO2 (%):  [40 %-100 %] 40 % (05/28 0415)  Intake/Output from previous day: 05/27 0701 - 05/28 0700 In: 1065 [P.O.:60; I.V.:440; NG/GT:215; IV Piggyback:350] Out: 2160 [Urine:2160]  Physical Exam: General: alert, cooperative, lying in bed in NAD HEENT: pupils equal round and reactive to light, vision grossly intact, oropharynx clear and non-erythematous Neck: supple, no lymphadenopathy, no JVD Lungs: mechanical breath sounds, ronchi R > L Heart: regular rate and rhythm, no m/g/r Abdomen: soft, non-tender, non-distended, normal bowel sounds Extremities: no edema Neurologic: alert & oriented X3, cranial nerves II-XII intact, strength grossly intact, sensation intact to light touch  Lab Results:  Recent Labs  05/14/13 0355 05/15/13 0428  WBC 12.1* 15.6*  HGB 9.5* 11.0*  PLT 206 275    Recent Labs  05/14/13 0355 05/15/13 0428  NA 135 139  K 3.1* 3.9  CL 97 98  CO2 30 33*  GLUCOSE 125* 103*  BUN 20 19  CREATININE 0.70 0.55   No results found for this basename: TROPONINI, CK, MB,  in the last 72 hours  Cardiac Studies: Echocardiogram 05/10/13 - Left ventricle: The cavity size was normal. Wall thickness was normal. Systolic function was normal. The estimated ejection fraction was in the range of 55% to 60%. Regional wall motion abnormalities cannot be excluded. Doppler parameters are consistent with abnormal left ventricular relaxation (grade 1 diastolic dysfunction). Doppler parameters are  consistent with high ventricular filling pressure. - Pulmonary arteries: Systolic pressure was mildly increased. PA peak pressure: 36mm Hg (S). - Pericardium, extracardiac: A small pericardial effusion was identified.  Tele: NSR  Assessment/Plan:  The patient is a 67 yo woman, history of HTN, NICM, LBBB, afib, presenting with dyspnea and hypoxia, found to be hypertensive with pulmonary edema.   # Acute Respiratory Failure - The patient presented with dyspnea and acute respiratory failure, found to have diffuse pulmonary edema as well as a right-sided infiltrate concerning for aspiration pneumonia (witnessed vomiting by EMS).  CXR continues to show a large right-sided infiltrate consistent with aspiration pneumonia.  Patient extubated 5/27, currently on BiPap due to desaturations on NRB. -abx per CCM, currently on vanc, ceftaz -consider restarting lasix based on CXR this morning  # Atrial fibrillation - the patient has a history of afib with CHADS2-Vasc score of 5. The patient has been in sinus rhythm overnight.  Patient on amiodarone 5/23-5/25, then restarted home flecainide 5/25.  Norepi discontinued 5/26. -continue flecainide -consider restarting BB today  # Hypertension - the patient initially presented with elevated BP and pulmonary edema, thought to represent hypertensive emergency.  The patient was started on a nitro drip, but later had a PEA arrest, followed by hypotension, and the nitro drip was discontinued, and norepi was started 5/22, weaned off 5/26.  Now, BP's have increased -consider restarting coreg today -if BP remains elevated, can restart losartan tomorrow  # EKG abnormalities - the patient was initially called as a code STEMI, but further review of EKG showed only stable changes and  old LBBB.  The patient notes no chest pain.  The patient had an exercise stress test 11/2012, which was stopped early due to fatigue, but showed no acute abnormalities. Cardiac cath 04/22/04  showed 30% stenosis of LAD at that time.  # Hypothyroidism - the patient has a history of hypothyroidism -continue synthroid  # Hypokalemia - Repleted # Hyponatremia - Resolved # History of breast cancer - s/p chemo, radiation  # Carotid Stenosis - 60-79% stenosis of L ICA seen on dopplers 02/12/13  Janalyn Harder, M.D. 05/15/2013, 6:20 AM Patient seen on morning rounds with residents.  Personally examined patient. She is maintaining NSR. Exam shows decreased breath sounds especially on right. Heart reveals no S3 gallop. Agree with assessment and plans as noted above. Continue flecainide for now.  Address long term anticoagulation prior to discharge. Chads-Vasc of 5 but was not on anticoagulation pre-hospital.

## 2013-05-15 NOTE — Progress Notes (Signed)
Pt given trial off bipap via 50% venturimask.  After 5 minutes, patient began using more accessory muscles and began to tripod.  Patient returned to bipap for now.  Respiratory rate and WOB decreased after return to bipap.

## 2013-05-15 NOTE — Progress Notes (Signed)
Physical Therapy Treatment Patient Details Name: [name] MRN: [id number] DOB: February 08, 1946 Today's Date: 05/15/2013 Time: 4401 [phone] PT Time Calculation (min): 23 min  PT Assessment / Plan / Recommendation Comments on Treatment Session  Pt moving fairly well but fatigues quickly & labored breathing with activity however 02 sats remained >93% entire session.      Follow Up Recommendations  Home health PT;Supervision/Assistance - 24 hour     Does the patient have the potential to tolerate intense rehabilitation     Barriers to Discharge        Equipment Recommendations   (TBA)    Recommendations for Other Services    Frequency Min 3X/week   Plan Discharge plan remains appropriate    Precautions / Restrictions Precautions Precautions: Fall Restrictions Weight Bearing Restrictions: No   Pertinent Vitals/Pain Pt on BiPap.  02 sats remained >93% entire session but breathing appeared labored with activity.      Mobility  Bed Mobility Bed Mobility: Supine to Sit;Sitting - Scoot to Edge of Bed Supine to Sit: 4: Min assist;HOB elevated;With rails Sitting - Scoot to Edge of Bed: 5: Supervision Details for Bed Mobility Assistance: (A) to lift shoulders/trunk to sitting upright.  Labored breathing noted but 02 sats remained WFL Transfers Transfers: Sit to Stand;Stand to Sit;Stand Pivot Transfers Sit to Stand: 1: +2 Total assist;With upper extremity assist;From bed Sit to Stand: Patient Percentage: 80% Stand to Sit: 4: Min assist;With upper extremity assist;With armrests;To bed;To chair/3-in-1 Stand Pivot Transfers: 1: +2 Total assist Stand Pivot Transfers: Patient Percentage: 80% Details for Transfer Assistance: (A) for balance & safety.  Cues for technique.  Pt able to take pivotal steps from bed>chair.   Ambulation/Gait Stairs: No Wheelchair Mobility Wheelchair Mobility: No      PT Goals Acute Rehab PT Goals Time For Goal Achievement: 05/28/13 Potential to  Achieve Goals: Good Pt will go Supine/Side to Sit: with modified independence PT Goal: Supine/Side to Sit - Progress: Progressing toward goal Pt will go Sit to Stand: with min assist;with upper extremity assist PT Goal: Sit to Stand - Progress: Progressing toward goal Pt will Transfer Bed to Chair/Chair to Bed: with min assist PT Transfer Goal: Bed to Chair/Chair to Bed - Progress: Progressing toward goal Pt will Stand: with min assist;1 - 2 min;with bilateral upper extremity support PT Goal: Stand - Progress: Progressing toward goal Pt will Ambulate: 16 - 50 feet;with min assist;with least restrictive assistive device Pt will Go Up / Down Stairs: 3-5 stairs;with min assist;with rail(s) Pt will Perform Home Exercise Program: Independently  Visit Information  Last PT Received On: 05/15/13 Assistance Needed: +2 (line management OOB)    Subjective Data      Cognition  Cognition Arousal/Alertness: Awake/alert Behavior During Therapy: WFL for tasks assessed/performed Overall Cognitive Status: Within Functional Limits for tasks assessed Difficult to assess due to:  (on BiPap; pt uses dry erase board to write)    Balance  Balance Balance Assessed: Yes Static Standing Balance Static Standing - Balance Support: Right upper extremity supported Static Standing - Level of Assistance: 4: Min assist (pt=90%) Static Standing - Comment/# of Minutes: (A) for balance & safety.  Pt able to march in place with min (A).    End of Session PT - End of Session Activity Tolerance: Patient tolerated treatment well Patient left: in chair;with call bell/phone within reach;with family/visitor present Nurse Communication: Mobility status     Verdell Face, [place] [phone] 05/15/2013

## 2013-05-16 ENCOUNTER — Inpatient Hospital Stay (HOSPITAL_COMMUNITY): Payer: Medicare Other

## 2013-05-16 DIAGNOSIS — I447 Left bundle-branch block, unspecified: Secondary | ICD-10-CM

## 2013-05-16 LAB — BASIC METABOLIC PANEL
BUN: 21 mg/dL (ref 6–23)
Calcium: 9.1 mg/dL (ref 8.4–10.5)
Creatinine, Ser: 0.54 mg/dL (ref 0.50–1.10)
GFR calc Af Amer: >90 mL/min (ref >90)
GFR calc non Af Amer: >90 mL/min (ref >90)
Glucose, Bld: 127 mg/dL — ABNORMAL HIGH (ref 70–99)
Potassium: 4 mEq/L (ref 3.5–5.1)

## 2013-05-16 LAB — CULTURE, BLOOD (ROUTINE X 2): Culture: NO GROWTH

## 2013-05-16 LAB — CHOLESTEROL, BODY FLUID

## 2013-05-16 MED ORDER — POTASSIUM CHLORIDE CRYS ER 20 MEQ PO TBCR
40.0000 meq | EXTENDED_RELEASE_TABLET | Freq: Once | ORAL | Status: AC
  Administered 2013-05-16: 40 meq via ORAL
  Filled 2013-05-16: qty 2

## 2013-05-16 MED ORDER — FUROSEMIDE 10 MG/ML IJ SOLN
INTRAMUSCULAR | Status: AC
  Administered 2013-05-16: 18:00:00
  Filled 2013-05-16: qty 4

## 2013-05-16 MED ORDER — FLECAINIDE ACETATE 100 MG PO TABS
100.0000 mg | ORAL_TABLET | Freq: Two times a day (BID) | ORAL | Status: DC
  Administered 2013-05-16 – 2013-05-17 (×3): 100 mg via ORAL
  Filled 2013-05-16 (×4): qty 1

## 2013-05-16 MED ORDER — LABETALOL HCL 5 MG/ML IV SOLN
INTRAVENOUS | Status: AC
  Filled 2013-05-16: qty 4

## 2013-05-16 MED ORDER — RIVAROXABAN 20 MG PO TABS
20.0000 mg | ORAL_TABLET | Freq: Every day | ORAL | Status: DC
  Administered 2013-05-16 – 2013-05-22 (×7): 20 mg via ORAL
  Filled 2013-05-16 (×8): qty 1

## 2013-05-16 MED ORDER — HYDRALAZINE HCL 20 MG/ML IJ SOLN: 10.0000 mg | INTRAMUSCULAR | Status: DC | PRN

## 2013-05-16 MED ORDER — AMOXICILLIN-POT CLAVULANATE 875-125 MG PO TABS
1.0000 | ORAL_TABLET | Freq: Two times a day (BID) | ORAL | Status: AC
  Administered 2013-05-16 – 2013-05-18 (×6): 1 via ORAL
  Filled 2013-05-16 (×6): qty 1

## 2013-05-16 MED ORDER — CARVEDILOL 6.25 MG PO TABS
6.2500 mg | ORAL_TABLET | Freq: Two times a day (BID) | ORAL | Status: DC
  Administered 2013-05-16: 6.25 mg via ORAL
  Filled 2013-05-16 (×3): qty 1

## 2013-05-16 MED ORDER — CARVEDILOL 12.5 MG PO TABS
12.5000 mg | ORAL_TABLET | Freq: Two times a day (BID) | ORAL | Status: DC
  Administered 2013-05-16 – 2013-05-22 (×12): 12.5 mg via ORAL
  Filled 2013-05-16 (×14): qty 1

## 2013-05-16 MED ORDER — LABETALOL HCL 5 MG/ML IV SOLN
10.0000 mg | INTRAVENOUS | Status: DC | PRN
  Administered 2013-05-16: 10 mg via INTRAVENOUS

## 2013-05-16 MED ORDER — LOSARTAN POTASSIUM 25 MG PO TABS
25.0000 mg | ORAL_TABLET | Freq: Every day | ORAL | Status: DC
  Administered 2013-05-16: 25 mg via ORAL
  Filled 2013-05-16: qty 1

## 2013-05-16 MED ORDER — FUROSEMIDE 10 MG/ML IJ SOLN
40.0000 mg | Freq: Once | INTRAMUSCULAR | Status: AC
  Administered 2013-05-16: 40 mg via INTRAVENOUS
  Filled 2013-05-16: qty 4

## 2013-05-16 MED ORDER — FENTANYL CITRATE 0.05 MG/ML IJ SOLN
50.0000 ug | Freq: Once | INTRAMUSCULAR | Status: AC
  Administered 2013-05-16: 50 ug via INTRAVENOUS

## 2013-05-16 MED ORDER — POTASSIUM CHLORIDE 10 MEQ/50ML IV SOLN
INTRAVENOUS | Status: AC
  Administered 2013-05-16: 10 meq via INTRAVENOUS
  Filled 2013-05-16: qty 150

## 2013-05-16 MED ORDER — POTASSIUM CHLORIDE 10 MEQ/50ML IV SOLN
10.0000 meq | INTRAVENOUS | Status: AC
  Administered 2013-05-16 (×2): 10 meq via INTRAVENOUS

## 2013-05-16 MED ORDER — SODIUM CHLORIDE 0.9 % IV SOLN: INTRAVENOUS | Status: DC | PRN

## 2013-05-16 MED ORDER — LOSARTAN POTASSIUM 50 MG PO TABS
50.0000 mg | ORAL_TABLET | Freq: Every day | ORAL | Status: DC
  Administered 2013-05-17 – 2013-05-22 (×6): 50 mg via ORAL
  Filled 2013-05-16 (×6): qty 1

## 2013-05-16 MED ORDER — FUROSEMIDE 10 MG/ML IJ SOLN
40.0000 mg | Freq: Once | INTRAMUSCULAR | Status: AC
  Administered 2013-05-16: 40 mg via INTRAVENOUS

## 2013-05-16 NOTE — Progress Notes (Signed)
Subjective:  The patient was able to wean off BiPap yesterday, and is now on nasal cannula, though still notes subjective dyspnea, with no cp, or palpitations.  BP elevated this morning.  The patient re-entered afib overnight, with HR as high as 150's, in self-limited episodes.  Thoracentesis yesterday yielded 1300 cc's of fluid.  Objective:  Vital Signs in the last 24 hours: Temp:  [99 F (37.2 C)-101.1 F (38.4 C)] 99 F (37.2 C) (05/29 0400) Pulse Rate:  [77-116] 101 (05/29 0500) Resp:  [13-32] 24 (05/29 0500) BP: (84-217)/(35-129) 217/84 mmHg (05/29 0500) SpO2:  [90 %-98 %] 90 % (05/29 0500) FiO2 (%):  [0.5 %-50 %] 0.5 % (05/28 1400)  Intake/Output from previous day: 05/28 0701 - 05/29 0700 In: 1360 [P.O.:720; I.V.:240; IV Piggyback:400] Out: 2350 [Urine:2350]  Physical Exam: General: alert, cooperative, lying in bed in NAD HEENT: pupils equal round and reactive to light, vision grossly intact, oropharynx clear and non-erythematous Neck: supple, no lymphadenopathy, no JVD Lungs: mechanical breath sounds, ronchi R > L Heart: regular rate and rhythm, no m/g/r Abdomen: soft, non-tender, non-distended, normal bowel sounds Extremities: no edema Neurologic: alert & oriented X3, cranial nerves II-XII intact, strength grossly intact, sensation intact to light touch  Lab Results:  Recent Labs  05/14/13 0355 05/15/13 0428  WBC 12.1* 15.6*  HGB 9.5* 11.0*  PLT 206 275    Recent Labs  05/14/13 0355 05/15/13 0428  NA 135 139  K 3.1* 3.9  CL 97 98  CO2 30 33*  GLUCOSE 125* 103*  BUN 20 19  CREATININE 0.70 0.55   No results found for this basename: TROPONINI, CK, MB,  in the last 72 hours  Cardiac Studies: Echocardiogram 05/10/13 - Left ventricle: The cavity size was normal. Wall thickness was normal. Systolic function was normal. The estimated ejection fraction was in the range of 55% to 60%. Regional wall motion abnormalities cannot be excluded.  Doppler parameters are consistent with abnormal left ventricular relaxation (grade 1 diastolic dysfunction). Doppler parameters are consistent with high ventricular filling pressure. - Pulmonary arteries: Systolic pressure was mildly increased. PA peak pressure: 36mm Hg (S). - Pericardium, extracardiac: A small pericardial effusion was identified.  Tele: NSR  Assessment/Plan:  The patient is a 67 yo woman, history of HTN, NICM, LBBB, afib, presenting with dyspnea and hypoxia, found to be hypertensive with pulmonary edema.   # Acute Respiratory Failure - The patient presented with dyspnea and acute respiratory failure, found to have diffuse pulmonary edema as well as a right-sided infiltrate concerning for aspiration pneumonia (witnessed vomiting by EMS).  CXR continues to show a large right-sided infiltrate consistent with aspiration pneumonia.  Patient extubated 5/27, currently on O2 by [place]. -per CCM  # Atrial fibrillation - the patient has a history of afib with CHADS2-Vasc score of 5. The patient re-entered afib overnight.  Patient on amiodarone 5/23-5/25, then restarted home flecainide 5/25.  Norepi discontinued 5/26. -continue flecainide -increase coreg dose today  # Hypertension - the patient initially presented with elevated BP and pulmonary edema, thought to represent hypertensive emergency.  The patient was started on a nitro drip, but later had a PEA arrest, followed by hypotension, and the nitro drip was discontinued, and norepi was started 5/22, weaned off 5/26.  Now, BP's have increased -increase coreg dose -restart losartan this morning  # EKG abnormalities - the patient was initially called as a code STEMI, but further review of EKG showed only stable changes and old LBBB.  The patient notes no chest pain.  The patient had an exercise stress test 11/2012, which was stopped early due to fatigue, but showed no acute abnormalities. Cardiac cath 04/22/04 showed 30% stenosis of LAD  at that time.  # Hypothyroidism - the patient has a history of hypothyroidism -continue synthroid  # Hypokalemia - Repleted # Hyponatremia - Resolved # History of breast cancer - s/p chemo, radiation  # Carotid Stenosis - 60-79% stenosis of L ICA seen on dopplers 02/12/13  [name], M.D. 05/16/2013, 6:21 AM   Patient seen and examined and history reviewed. Agree with above findings and plan. Patient still has some cough. Describes right sided rib pain. Oxygen requirements are less. On exam she has bilateral rhonchi. No gallop or murmur. No Edema. She has recurrent Afib with RVR on monitor. She has a high [place] score. Anticoagulation was mentioned in office note 12/13 but never instituted. Will start Xarelto. Will also increase flecainide to 100 mg bid since she is still having breakthrough and has been on current dose for 3 days. Will resume cozaar and increase coreg for BP control.  [name] [facility] 05/16/2013 7:43 AM

## 2013-05-16 NOTE — Care Management Note (Signed)
  Page 1 of 1   05/16/2013     3:16:51 PM   CARE MANAGEMENT NOTE 05/16/2013  Patient:  [name], [name]   Account Number:  [id number]  Date Initiated:  05/10/2013  Documentation initiated by:  Jiles Crocker  Subjective/Objective Assessment:   ADMITTED WITH Hypertensive Crisis, Cardiac Arrest, Pulmonary Edema     Action/Plan:   REMAINS IN THE ICU; GENERAL CONDITION GUARDED; CM FOLLOWING FOR DCP   Anticipated DC Date:  05/24/2013   Anticipated DC Plan:  HOME W HOME HEALTH SERVICES         Choice offered to / List presented to:             Status of service:  In process, will continue to follow Medicare Important Message given?  NA - LOS <3 / Initial given by admissions (If response is "NO", the following Medicare IM given date fields will be blank) Date Medicare IM given:   Date Additional Medicare IM given:    Discharge Disposition:    Per UR Regulation:  Reviewed for med. necessity/level of care/duration of stay  If discussed at Long Length of Stay Meetings, dates discussed:   05/16/2013    Comments:  5/29 1513  debbie Easter [place] rn,bsn spoke w husband and explained hhc. he definitely would not want snf. his mother was in snf and had bad experience. he will take her home w hhc and hire help. he states good support but can hire addit help also. gave him hhc agency list in [place] and sitter[place] agencies [place]. pt not doing as well this afternoon and back on bipap.pt is retired Engineer, civil (consulting).  5/29  1201p debbie Gwen Sarvis rn,bsn spoke w pt and fam. pt lives w husband but he works. pt interested in hhc and no hx of hhc. still short of breath. will arrange hhc prior to disch. gave pt xarelto 10day free card if needed. saw that pt on xarelto in hospital.  05/10/2012- B CHANDLER RN,BSN,MHA

## 2013-05-16 NOTE — Progress Notes (Signed)
PULMONARY  / CRITICAL CARE MEDICINE  Name: [name] MRN: [id number] DOB: 07/01/1946    ADMISSION DATE:  05/09/2013 CONSULTATION DATE:  05/09/13  REFERRING MD :  EDP - Dr. Bebe Shaggy PRIMARY SERVICE: PCCM   BRIEF PATIENT DESCRIPTION: [name] who presented to [facility] ER on 5/22 with hypertensive crisis, pulmonary edema and subsequent cardiac arrest (PEA initial rhythm). RLL consolidation and strep Ag positive  SIGNIFICANT EVENTS / STUDIES:  5/22 - Admit with Hypertensive Crisis, Cardiac Arrest, Pulmonary Edema, PNA 5/22 CT head: NAD 5/22 CT chest: Bilateral pleural effusions. Peribronchial cuffing and areas of scattered ground-glass opacity, right lower lobe predominant, may reflect infection or edema 5/23 Echo: EF 55-60% 5/28 R thoracentesis. 1300 clear yellow fluid - transudate by chemistries   LINES / TUBES: L [place] Portacath 04/16/12 ETT 5/22 >> 5/27 R IJ TLC 5/22 >>  R Fem A-line 5/22 >> 5/26  CULTURES: BCx2 5/22 >> NEG Sputum 5/24 >> NOF Urine 5/22, 5/23 >> NEG U. Strep 5/22>>>POS U. Legionella 5/22>>>neg R pleural fluid 5/28 >>    ANTIBIOTICS: Vanco 5/22>>>5/27 Ceftaz 5/22>>>5/27 Unasyn 5/27 >> 5/29 Augmentin 5/29 >> 6/1 (stop date ordered)   SUBJECTIVE:  This AM looked great and orders for transfer to SDU placed. Subsequently developed severe hypertension with abrupt worsening of SOB and hypoxia. Transiently on NPPV.  VITAL SIGNS: Temp:  [93.2 F (34 C)-101.1 F (38.4 C)] 97.5 F (36.4 C) (05/29 1600) Pulse Rate:  [77-116] 95 (05/29 1600) Resp:  [13-32] 32 (05/29 1600) BP: (93-229)/(35-163) 181/70 mmHg (05/29 1600) SpO2:  [90 %-98 %] 91 % (05/29 1600) FiO2 (%):  [70 %] 70 % (05/29 1334)  HEMODYNAMICS: CVP:  [3 mmHg] 3 mmHg  VENTILATOR SETTINGS: Vent Mode:  [-] BIPAP FiO2 (%):  [70 %] 70 % Set Rate:  [10 bmp] 10 bmp  INTAKE / OUTPUT: Intake/Output     05/28 0701 - 05/29 0700 05/29 0701 - 05/30 0700   P.O. 720 660   I.V. (mL/kg) 260 (3.9) 40 (0.6)    NG/GT     IV Piggyback 400 150   Total Intake(mL/kg) 1380 (20.5) 850 (12.6)   Urine (mL/kg/hr) 2350 (1.5) 1550 (2.4)   Total Output 2350 1550   Net -970 -700          PHYSICAL EXAMINATION: General: RASS 0, CAM-ICU neg Neuro: No focal deficits HEENT:  WNL Cardiovascular: RRR s M Lungs: dull with diminished BS in B bases R>L Abdomen: soft, NT, NABS Ext: warm, no edema  LABS: BMET    Component Value Date/Time   NA 140 05/16/2013 0500   NA 143 09/05/2012 1438   K 4.0 05/16/2013 0500   K 4.8 09/05/2012 1438   CL 98 05/16/2013 0500   CL 104 09/05/2012 1438   CO2 35* 05/16/2013 0500   CO2 31* 09/05/2012 1438   GLUCOSE 127* 05/16/2013 0500   GLUCOSE 125* 09/05/2012 1438   BUN 21 05/16/2013 0500   BUN 16.0 09/05/2012 1438   CREATININE 0.54 05/16/2013 0500   CREATININE 0.8 09/05/2012 1438   CALCIUM 9.1 05/16/2013 0500   CALCIUM 9.4 09/05/2012 1438   GFRNONAA >90 05/16/2013 0500   GFRAA >90 05/16/2013 0500    CBC    Component Value Date/Time   WBC 15.6* 05/15/2013 0428   WBC 7.1 09/05/2012 1437   RBC 3.49* 05/15/2013 0428   RBC 4.02 09/05/2012 1437   HGB 11.0* 05/15/2013 0428   HGB 12.3 09/05/2012 1437   HCT 32.0* 05/15/2013 0428  HCT 37.9 09/05/2012 1437   PLT 275 05/15/2013 0428   PLT 255 09/05/2012 1437   MCV 91.7 05/15/2013 0428   MCV 94.4 09/05/2012 1437   MCH 31.5 05/15/2013 0428   MCH 30.5 09/05/2012 1437   MCHC 34.4 05/15/2013 0428   MCHC 32.3 09/05/2012 1437   RDW 14.8 05/15/2013 0428   RDW 14.2 09/05/2012 1437   LYMPHSABS 1.5 05/09/2013 1648   LYMPHSABS 1.5 09/05/2012 1437   MONOABS 0.4 05/09/2013 1648   MONOABS 0.7 09/05/2012 1437   EOSABS 0.2 05/09/2013 1648   EOSABS 0.6* 09/05/2012 1437   BASOSABS 0.0 05/09/2013 1648   BASOSABS 0.1 09/05/2012 1437      Recent Labs Lab 05/14/13 1535 05/14/13 2155 05/14/13 2350 05/15/13 0409 05/15/13 0733  GLUCAP 143* 72 86 90 99    CXR: RUL atx, partial reaccumulation of R effusion, persistent opacification of LLL  ASSESSMENT /  PLAN:  PULMONARY A:  Acute Respiratory Failure - multifactorial. Suspect PNA primary process Pulmonary Edema - in setting of hypertensive crisis Aspiration PNA/ ALI -  Strep Ag positive Resp distress post extubation R Pleural effusion - tapped 5/28. Transudate by chemistries  P:   Cont PRN BiPAP Repeat Lasix  Cont abx for now [place] R hemithorax to assess for effusion - tap if large  CARDIOVASCULAR A:  Cardiac Arrest on admission - likely secondary to resp distress - PEA initial rhythm, required 7 min CPR in ER Hypertensive Emergency Labile hypertension H/O LBBB H/o PAF   P:  -Increase carvedilol -Increase losartan -Cont PRN Hydralazine -Cont flecainide - Cards following  RENAL A:   Hypokalemia, resolved  P:   -Replete as indicated -Keep I/Os even to negative   GASTROINTESTINAL A:  Nausea  P:   -SUP not indicated -Advance nutrition as tolerated -PRN ondansetron   HEMATOLOGIC / ONC A:   Hx of Breast CA / Hodgkins - s/p last Rx in 2013 - chemo RT to chest wall recurrence P:  Cont sub q hep   INFECTIOUS A:   Pneumococcal PNA Aspiration PNA - presumed with multiple episodes of vomiting  P:   -abx as above   ENDOCRINE A:   Hyperglycemia  - in setting of arrest. Resolved Hypothyroidisim -TSH 7 P:   -SSI no longer indicated -continue synthroid  NEUROLOGIC A:   Acute Encephalopathy - in setting of arrest, resolved Anxiety P:   -Cont PRN Xanax  Transfer to SDU   Billy Fischer, MD ; [facility] service Mobile [phone].  After 5:30 PM or weekends, call [phone]  05/16/2013, 4:25 PM

## 2013-05-16 NOTE — Progress Notes (Signed)
PT Cancellation Note  Patient Details Name: [name] MRN: [id number] DOB: 08-20-1946   Cancelled Treatment:    Reason Eval/Treat Not Completed: Medical issues which prohibited therapy (Respiratory Distress.  RN request to hold.)   Lasaro Primm 05/16/2013, 2:55 PM Jake Shark, PT DPT [phone]

## 2013-05-16 NOTE — Progress Notes (Addendum)
Pt taken off BiPAP due to pt having to spit up and feeling nauseous. Pt placed back on 5L [place]. RT will monitor.

## 2013-05-16 NOTE — Progress Notes (Signed)
Pt hypertensive SBP increasing from 130s-190s. Elink MD notified. New orders received and followed. Will continue to monitor closely.

## 2013-05-16 NOTE — Progress Notes (Signed)
Pt complaining for pain unrelieved with previous medications. Elink MD notified. New orders received and followed. Will continue to monitor closely.

## 2013-05-17 ENCOUNTER — Inpatient Hospital Stay (HOSPITAL_COMMUNITY): Payer: Medicare Other

## 2013-05-17 DIAGNOSIS — I679 Cerebrovascular disease, unspecified: Secondary | ICD-10-CM

## 2013-05-17 DIAGNOSIS — C50919 Malignant neoplasm of unspecified site of unspecified female breast: Secondary | ICD-10-CM

## 2013-05-17 DIAGNOSIS — I25810 Atherosclerosis of coronary artery bypass graft(s) without angina pectoris: Secondary | ICD-10-CM

## 2013-05-17 LAB — BASIC METABOLIC PANEL
BUN: 21 mg/dL (ref 6–23)
Calcium: 9.2 mg/dL (ref 8.4–10.5)
Creatinine, Ser: 0.56 mg/dL (ref 0.50–1.10)
GFR calc Af Amer: >90 mL/min (ref >90)
GFR calc non Af Amer: >90 mL/min (ref >90)

## 2013-05-17 LAB — CBC
HCT: 36.3 % (ref 36.0–46.0)
MCHC: 33.3 g/dL (ref 30.0–36.0)
Platelets: 425 10*3/uL — ABNORMAL HIGH (ref 150–400)
RDW: 14.8 % (ref 11.5–15.5)
WBC: 14.8 10*3/uL — ABNORMAL HIGH (ref 4.0–10.5)

## 2013-05-17 MED ORDER — FUROSEMIDE 10 MG/ML IJ SOLN
80.0000 mg | Freq: Once | INTRAMUSCULAR | Status: AC
  Administered 2013-05-17: 80 mg via INTRAVENOUS
  Filled 2013-05-17: qty 8

## 2013-05-17 MED ORDER — SENNOSIDES-DOCUSATE SODIUM 8.6-50 MG PO TABS
1.0000 | ORAL_TABLET | Freq: Two times a day (BID) | ORAL | Status: DC
  Administered 2013-05-17: 1 via ORAL
  Filled 2013-05-17 (×2): qty 1

## 2013-05-17 NOTE — Progress Notes (Addendum)
Subjective:  The patient re-entered afib for a period of < 20 minutes early this morning, with rates in the 130-150's.  The patient reports feeling no palpitations, chest pain, or lightheadedness.  She notes that her dyspnea is improving today.  The patient required BiPap briefly yesterday afternoon, but is now breathing comfortably on nasal cannula.  BP is better controlled this morning, but was elevated as high as the 200's yesterday.  Objective:  Vital Signs in the last 24 hours: Temp:  [93.2 F (34 C)-99.9 F (37.7 C)] 99.7 F (37.6 C) (05/30 0400) Pulse Rate:  [77-105] 85 (05/30 0500) Resp:  [14-32] 17 (05/30 0500) BP: (76-229)/(36-163) 110/44 mmHg (05/30 0500) SpO2:  [91 %-99 %] 95 % (05/30 0500) FiO2 (%):  [70 %] 70 % (05/29 1334)  Intake/Output from previous day: 05/29 0701 - 05/30 0700 In: 1390 [P.O.:990; I.V.:150; IV Piggyback:250] Out: 2420 [Urine:2420]  Physical Exam: General: alert, cooperative, lying in bed in NAD HEENT: pupils equal round and reactive to light, vision grossly intact, oropharynx clear and non-erythematous Neck: supple, no lymphadenopathy, no JVD Lungs: mildly increased work of respiration, ronchi R > L  Decreased BS at L base Heart: regular rate and rhythm, no m/g/r Abdomen: soft, non-tender, non-distended, normal bowel sounds Extremities: no edema Neurologic: alert & oriented X3, cranial nerves II-XII intact, strength grossly intact, sensation intact to light touch  Lab Results:  Recent Labs  05/15/13 0428  WBC 15.6*  HGB 11.0*  PLT 275    Recent Labs  05/15/13 0428 05/16/13 0500  NA 139 140  K 3.9 4.0  CL 98 98  CO2 33* 35*  GLUCOSE 103* 127*  BUN 19 21  CREATININE 0.55 0.54   No results found for this basename: TROPONINI, CK, MB,  in the last 72 hours  Cardiac Studies: Echocardiogram 05/10/13 - Left ventricle: The cavity size was normal. Wall thickness was normal. Systolic function was normal. The estimated ejection  fraction was in the range of 55% to 60%. Regional wall motion abnormalities cannot be excluded. Doppler parameters are consistent with abnormal left ventricular relaxation (grade 1 diastolic dysfunction). Doppler parameters are consistent with high ventricular filling pressure. - Pulmonary arteries: Systolic pressure was mildly increased. PA peak pressure: 36mm Hg (S). - Pericardium, extracardiac: A small pericardial effusion was identified.  Tele: NSR, 1 episode of afib around 3:40 am, lasting < 20 minutes  Assessment/Plan:  The patient is a 67 yo woman, history of HTN, NICM, LBBB, afib, presenting with dyspnea and hypoxia, found to be hypertensive with pulmonary edema.   # Acute Respiratory Failure - The patient presented with dyspnea and acute respiratory failure, found to have diffuse pulmonary edema as well as a right-sided infiltrate concerning for aspiration pneumonia (witnessed vomiting by EMS).  CXR continues to show a large right-sided infiltrate consistent with aspiration pneumonia.  Patient extubated 5/27, currently on O2 by [place]. CCM following  Would continue diuresis.  Give 80 lasix this am and follow response CXR today.  # Atrial fibrillation - the patient has a history of afib with CHADS2-Vasc score of 5. The patient re-entered afib overnight.  Patient on amiodarone 5/23-5/25, then restarted home flecainide 5/25.  The dose of flecainide was increased to 100 mg BID 5/29.  Norepi discontinued 5/26. I have reviewed with EP  With patient's history of vascular disease and recent events do not feel flecanide is best choice for rhythm agent.  Would d/c and recomm rate control  Let wash out of blood  stream   Consider tikosyn load in a few days.  Follow EKG   For now: -continue coreg 12.5 BID, can increase if needed -continue xarelto  # Hypertension - the patient initially presented with elevated BP and pulmonary edema, thought to represent hypertensive emergency.  The patient was  started on a nitro drip, but later had a PEA arrest, followed by hypotension, and the nitro drip was discontinued, and norepi was started 5/22, weaned off 5/26.  BP's elevated yesterday, though improved this morning -continue coreg -losartan increased to 50 mg daily Titrate as BP requires.  # EKG abnormalities - the patient was initially called as a code STEMI, but further review of EKG showed only stable changes and old LBBB.  The patient notes no chest pain.  The patient had an exercise stress test 11/2012, which was stopped early due to fatigue, but showed no acute abnormalities. Cardiac cath 04/22/04 showed 30% stenosis of LAD at that time.  # Hypothyroidism - the patient has a history of hypothyroidism -continue synthroid  # Hypokalemia - Repleted # Hyponatremia - Resolved # History of breast cancer - s/p chemo, radiation  Hodgkins lymphoma:  S/p XRT and chemo # Carotid Stenosis - 60-79% stenosis of L ICA seen on dopplers 02/12/13  Lipids:  Patient is not on a statin  WIl check panel in AM before initiating.  Janalyn Harder, M.D. 05/17/2013, 6:20 AM   Patient seen and examined.  I have amended note by Luther Redo to reflect my findings.  Plans as noted above.  Dietrich Pates

## 2013-05-17 NOTE — Progress Notes (Signed)
PULMONARY  / CRITICAL CARE MEDICINE  Name: [name] MRN: [id number] DOB: [birth year]    ADMISSION DATE:  05/09/2013 CONSULTATION DATE:  05/09/13  REFERRING MD :  EDP - Dr. Bebe Shaggy PRIMARY SERVICE: PCCM   BRIEF PATIENT DESCRIPTION: [name] who presented to [facility] ER on 5/22 with hypertensive crisis, pulmonary edema and subsequent cardiac arrest (PEA initial rhythm). RLL consolidation and strep Ag positive  SIGNIFICANT EVENTS / STUDIES:  5/22 - Admit with Hypertensive Crisis, Cardiac Arrest, Pulmonary Edema, PNA 5/22 CT head: NAD 5/22 CT chest: Bilateral pleural effusions. Peribronchial cuffing and areas of scattered ground-glass opacity, right lower lobe predominant, may reflect infection or edema 5/23 Echo: EF 55-60% 5/28 R thoracentesis. 1300 clear yellow fluid - transudate by chemistries 5/30 Transfer to SDU  LINES / TUBES: L [place] Portacath 04/16/12 ETT 5/22 >> 5/27 R IJ TLC 5/22 >>  R Fem A-line 5/22 >> 5/26  CULTURES: BCx2 5/22 >> NEG Sputum 5/24 >> NOF Urine 5/22, 5/23 >> NEG U. Strep 5/22>>>POS U. Legionella 5/22>>>neg R pleural fluid 5/28 >>    ANTIBIOTICS: Vanco 5/22>>>5/27 Ceftaz 5/22>>>5/27 Unasyn 5/27 >> 5/29 Augmentin 5/29 >> 6/1 (stop date ordered)   SUBJECTIVE:  Looks great and feels much better. No distress. No new complaints  VITAL SIGNS: Temp:  [97.5 F (36.4 C)-99.9 F (37.7 C)] 98.7 F (37.1 C) (05/30 1106) Pulse Rate:  [73-106] 73 (05/30 1200) Resp:  [15-32] 20 (05/30 1106) BP: (76-181)/(35-81) 104/42 mmHg (05/30 1200) SpO2:  [91 %-99 %] 96 % (05/30 1200)  HEMODYNAMICS:    VENTILATOR SETTINGS:    INTAKE / OUTPUT: Intake/Output     05/29 0701 - 05/30 0700 05/30 0701 - 05/31 0700   P.O. 1110 240   I.V. (mL/kg) 150 (2.2)    IV Piggyback 250    Total Intake(mL/kg) 1510 (22.4) 240 (3.6)   Urine (mL/kg/hr) 2720 (1.7) 875 (1.5)   Total Output 2720 875   Net -1210 -635          PHYSICAL EXAMINATION: General: RASS 0, CAM-ICU  neg Neuro: No focal deficits HEENT:  WNL Cardiovascular: RRR s M Lungs: dull with diminished BS in B bases R>L Abdomen: soft, NT, NABS Ext: warm, no edema  LABS: BMET    Component Value Date/Time   NA 137 05/17/2013 0835   NA 143 09/05/2012 1438   K 4.6 05/17/2013 0835   K 4.8 09/05/2012 1438   CL 94* 05/17/2013 0835   CL 104 09/05/2012 1438   CO2 33* 05/17/2013 0835   CO2 31* 09/05/2012 1438   GLUCOSE 103* 05/17/2013 0835   GLUCOSE 125* 09/05/2012 1438   BUN 21 05/17/2013 0835   BUN 16.0 09/05/2012 1438   CREATININE 0.56 05/17/2013 0835   CREATININE 0.8 09/05/2012 1438   CALCIUM 9.2 05/17/2013 0835   CALCIUM 9.4 09/05/2012 1438   GFRNONAA >90 05/17/2013 0835   GFRAA >90 05/17/2013 0835    CBC    Component Value Date/Time   WBC 14.8* 05/17/2013 0835   WBC 7.1 09/05/2012 1437   RBC 3.86* 05/17/2013 0835   RBC 4.02 09/05/2012 1437   HGB 12.1 05/17/2013 0835   HGB 12.3 09/05/2012 1437   HCT 36.3 05/17/2013 0835   HCT 37.9 09/05/2012 1437   PLT 425* 05/17/2013 0835   PLT 255 09/05/2012 1437   MCV 94.0 05/17/2013 0835   MCV 94.4 09/05/2012 1437   MCH 31.3 05/17/2013 0835   MCH 30.5 09/05/2012 1437   MCHC 33.3 05/17/2013 0835  MCHC 32.3 09/05/2012 1437   RDW 14.8 05/17/2013 0835   RDW 14.2 09/05/2012 1437   LYMPHSABS 1.5 05/09/2013 1648   LYMPHSABS 1.5 09/05/2012 1437   MONOABS 0.4 05/09/2013 1648   MONOABS 0.7 09/05/2012 1437   EOSABS 0.2 05/09/2013 1648   EOSABS 0.6* 09/05/2012 1437   BASOSABS 0.0 05/09/2013 1648   BASOSABS 0.1 09/05/2012 1437      Recent Labs Lab 05/14/13 1535 05/14/13 2155 05/14/13 2350 05/15/13 0409 05/15/13 0733  GLUCAP 143* 72 86 90 99    CXR: Much improved aeration in B lungs  ASSESSMENT / PLAN:  PULMONARY A:  Acute Respiratory Failure - multifactorial.  Pulmonary Edema - in setting of hypertensive crisis Aspiration PNA/ ALI -  Strep Ag positive Resp distress post extubation, resolved R Pleural effusion - tapped 5/28. Transudate by chemistries  P:   No  further Lasix planned Keep I/Os even Abx as above   CARDIOVASCULAR A:  Cardiac Arrest on admission - likely secondary to resp distress - PEA initial rhythm, required 7 min CPR in ER Hypertensive Emergency Labile hypertension H/O LBBB H/o PAF   P:  -Cont increased dose of carvedilol -Cont increased dose of losartan -Cont PRN Hydralazine -Cont flecainide -Cards following  RENAL A:   Hypokalemia, resolved  P:   -Replete as indicated -Keep I/Os even   GASTROINTESTINAL A:  Nausea  P:   -SUP not indicated -Advance nutrition as tolerated -Cont PRN ondansetron   HEMATOLOGIC / ONC A:   Hx of Breast CA / Hodgkins - s/p last Rx in 2013 - chemo RT to chest wall recurrence P:  Cont sub q hep   INFECTIOUS A:   Pneumococcal PNA Aspiration PNA - presumed with multiple episodes of vomiting  P:   -abx as above   ENDOCRINE A:   Hyperglycemia  - in setting of arrest. Resolved Hypothyroidisim -TSH 7 P:   -Monitor off SSI -continue synthroid  NEUROLOGIC A:   Acute Encephalopathy - in setting of arrest, resolved Anxiety P:   -Cont PRN Xanax  Transfer to SDU   Billy Fischer, MD ; [facility] service Mobile [phone].  After 5:30 PM or weekends, call [phone]  05/17/2013, 3:48 PM

## 2013-05-17 NOTE — Progress Notes (Signed)
Chaplain visited with pt family in 2900 waiting area and then went by pt's room. Pt awake and intubated. I spoke words of comfort and encouragement to her.Though pt could not speak, she put her hands together in a prayer posture and I offered prayer for pt.

## 2013-05-17 NOTE — Progress Notes (Signed)
Physical Therapy Treatment Patient Details Name: [name] MRN: [id number] DOB: 26-Sep-1946 Today's Date: 05/17/2013 Time: 2952-8413 PT Time Calculation (min): 28 min  PT Assessment / Plan / Recommendation Comments on Treatment Session  Pt able to ambulate this session however continues to be fatigued and needs extra time to complete task.  Sa02 decreased to 88% on 3L with mobility. Educated on pursed lip breathing.    Follow Up Recommendations  Home health PT;Supervision/Assistance - 24 hour     Equipment Recommendations  Rolling walker with 5" wheels    Frequency Min 3X/week   Plan Discharge plan remains appropriate    Precautions / Restrictions Precautions Precautions: Fall Restrictions Weight Bearing Restrictions: No   Pertinent Vitals/Pain C/o soreness in chest from compressions    Mobility  Bed Mobility Bed Mobility: Supine to Sit;Sitting - Scoot to Edge of Bed Supine to Sit: 4: Min guard;HOB elevated;With rails Sitting - Scoot to Edge of Bed: 5: Supervision Details for Bed Mobility Assistance: minguard for safety with heavy use of rail to elevate trunk Transfers Transfers: Sit to Stand;Stand to Sit Sit to Stand: 4: Min guard;From bed Stand to Sit: 4: Min assist;To chair/3-in-1 Details for Transfer Assistance: mingaurd for safety with cues for hand placement Ambulation/Gait Ambulation/Gait Assistance: 4: Min assist Ambulation Distance (Feet): 10 Feet Assistive device: Rolling walker Ambulation/Gait Assistance Details: (A) to manage RW with cues for upright posture and body positionw within RW Gait Pattern: Step-through pattern;Decreased stride length;Shuffle Gait velocity: decreased    Exercises General Exercises - Lower Extremity Gluteal Sets: Strengthening;Both;10 reps;Seated Long Arc Quad: Strengthening;Both;10 reps;Seated Hip Flexion/Marching: Strengthening;Both;10 reps;Seated   PT Diagnosis:    PT Problem List:   PT Treatment Interventions:      PT Goals Acute Rehab PT Goals PT Goal Formulation: With patient Time For Goal Achievement: 05/28/13 Potential to Achieve Goals: Good Pt will go Supine/Side to Sit: with modified independence PT Goal: Supine/Side to Sit - Progress: Progressing toward goal Pt will go Sit to Stand: with min assist;with upper extremity assist PT Goal: Sit to Stand - Progress: Progressing toward goal Pt will Transfer Bed to Chair/Chair to Bed: with min assist PT Transfer Goal: Bed to Chair/Chair to Bed - Progress: Met Pt will Stand: with min assist;1 - 2 min;with bilateral upper extremity support PT Goal: Stand - Progress: Progressing toward goal Pt will Ambulate: 16 - 50 feet;with min assist;with least restrictive assistive device PT Goal: Ambulate - Progress: Progressing toward goal  Visit Information  Last PT Received On: 05/17/13 Assistance Needed: +1    Subjective Data  Subjective: I'm feeling much better today Patient Stated Goal: to eventually go home   Cognition  Cognition Arousal/Alertness: Awake/alert Behavior During Therapy: WFL for tasks assessed/performed Overall Cognitive Status: Within Functional Limits for tasks assessed    Balance     End of Session PT - End of Session Equipment Utilized During Treatment: Gait belt;Oxygen (3L) Activity Tolerance: Patient tolerated treatment well Patient left: in chair;with call bell/phone within reach;with family/visitor present Nurse Communication: Mobility status   GP     Loreto Loescher 05/17/2013, 5:49 PM Jake Shark, PT DPT [phone]

## 2013-05-18 DIAGNOSIS — R0609 Other forms of dyspnea: Secondary | ICD-10-CM

## 2013-05-18 LAB — BASIC METABOLIC PANEL
CO2: 37 mEq/L — ABNORMAL HIGH (ref 19–32)
Calcium: 9.2 mg/dL (ref 8.4–10.5)
Chloride: 93 mEq/L — ABNORMAL LOW (ref 96–112)
Creatinine, Ser: 0.63 mg/dL (ref 0.50–1.10)
Glucose, Bld: 144 mg/dL — ABNORMAL HIGH (ref 70–99)

## 2013-05-18 LAB — GLUCOSE, CAPILLARY: Glucose-Capillary: 124 mg/dL — ABNORMAL HIGH (ref 70–99)

## 2013-05-18 LAB — LIPID PANEL
HDL: 26 mg/dL — ABNORMAL LOW (ref >39)
LDL Cholesterol: 102 mg/dL — ABNORMAL HIGH (ref 0–99)
Triglycerides: 175 mg/dL — ABNORMAL HIGH (ref <150)

## 2013-05-18 LAB — CK TOTAL AND CKMB (NOT AT ARMC): Total CK: 21 U/L (ref 7–177)

## 2013-05-18 MED ORDER — FUROSEMIDE 10 MG/ML IJ SOLN
40.0000 mg | Freq: Two times a day (BID) | INTRAMUSCULAR | Status: AC
  Administered 2013-05-18 (×2): 40 mg via INTRAVENOUS
  Filled 2013-05-18: qty 4

## 2013-05-18 MED ORDER — POTASSIUM CHLORIDE CRYS ER 20 MEQ PO TBCR
40.0000 meq | EXTENDED_RELEASE_TABLET | Freq: Once | ORAL | Status: AC
  Administered 2013-05-18: 40 meq via ORAL
  Filled 2013-05-18: qty 2

## 2013-05-18 MED ORDER — FUROSEMIDE 10 MG/ML IJ SOLN: 40.0000 mg | Freq: Two times a day (BID) | INTRAMUSCULAR | Status: DC

## 2013-05-18 NOTE — Progress Notes (Signed)
05/18/13 1408  Vitals  ECG Heart Rate ! 159   Dirk Dress NP & Dr. Diona Browner notified and transfer orders canceled. Patient has had 9 episodes of elevated heart rate from 121 to 172. EKG ordered and will continue to assess and monitor patient.

## 2013-05-18 NOTE — Progress Notes (Signed)
Discussed shift back into transient rapid afib with Dr. Diona Browner. We will hold off on further antiarrhythmics right now and continue BB. Pharmacy recommending 3 month washout period or low amio level in order to proceed with Tikosyn loading, but patient only received short-term IV amiodarone this admission thus may still be able to proceed on Monday. Will defer to rounding MD for decision. Continue to observe. Hadessah Grennan PA-C

## 2013-05-18 NOTE — Progress Notes (Signed)
Patient ID: [name], female   DOB: 1946-03-12, 67 y.o.   MRN: [id number]    Subjective:  Breathing better this morning.  In NSR.  Coughing up yellow sputum.  Good diuresis yesterday.   Objective:  Vital Signs in the last 24 hours: Temp:  [98 F (36.7 C)-99.3 F (37.4 C)] 98.2 F (36.8 C) (05/31 0744) Pulse Rate:  [73-90] 88 (05/31 0305) Resp:  [17-22] 22 (05/31 0305) BP: (87-127)/(27-51) 118/45 mmHg (05/31 0305) SpO2:  [90 %-97 %] 97 % (05/31 0305)  Intake/Output from previous day: 05/30 0701 - 05/31 0700 In: 840 [P.O.:840] Out: 3550 [Urine:3550]  Physical Exam: General: alert, cooperative, lying in bed in NAD Neck: supple, no lymphadenopathy, JVP 8-9 cm Lungs: Decreased breath sounds at bases bilaterally Heart: regular rate and rhythm, no m/g/r Abdomen: soft, non-tender, non-distended, normal bowel sounds Extremities: no edema Neurologic: alert & oriented X3, cranial nerves II-XII intact, strength grossly intact, sensation intact to light touch  Lab Results:  Recent Labs  05/17/13 0835  WBC 14.8*  HGB 12.1  PLT 425*    Recent Labs  05/16/13 0500 05/17/13 0835  NA 140 137  K 4.0 4.6  CL 98 94*  CO2 35* 33*  GLUCOSE 127* 103*  BUN 21 21  CREATININE 0.54 0.56   No results found for this basename: TROPONINI, CK, MB,  in the last 72 hours  Cardiac Studies: Echocardiogram 05/10/13 - Left ventricle: The cavity size was normal. Wall thickness was normal. Systolic function was normal. The estimated ejection fraction was in the range of 55% to 60%. Regional wall motion abnormalities cannot be excluded. Doppler parameters are consistent with abnormal left ventricular relaxation (grade 1 diastolic dysfunction). Doppler parameters are consistent with high ventricular filling pressure. - Pulmonary arteries: Systolic pressure was mildly increased. PA peak pressure: 36mm Hg (S). - Pericardium, extracardiac: A small pericardial effusion was  identified.  Tele: NSR, 1 episode of afib around 3:40 am, lasting < 20 minutes  Assessment/Plan:  The patient is a 67 yo woman, history of HTN, diastolic CHF, LBBB, paroxysmal afib presented with dyspnea and hypoxia, found to be hypertensive with pulmonary edema.   # Acute Respiratory Failure - The patient presented with dyspnea and acute respiratory failure, found to have diffuse pulmonary edema as well as a right-sided infiltrate concerning for aspiration pneumonia (witnessed vomiting by EMS).  CXR continues to show a large right-sided infiltrate consistent with aspiration pneumonia.  Patient extubated 5/27, currently on O2 by .  There additionally appears to be a component of acute/chronic diastolic CHF.  - Will give Lasix 40 mg IV bid x 2 more doses, get BNP in am.  - Augmentin for aspiration PNA .  # Atrial fibrillation - the patient has a history of paroxysmal afib with CHADS2-Vasc score of 5. Patient on amiodarone 5/23-5/25, then restarted home flecainide 5/25.  The dose of flecainide was increased to 100 mg BID 5/29.  Flecainide was stopped on 5/30.  Plan has been to start dofetilide.  Will need flecainide to wash out for at least 3 half-lives, so could potentially start dofetilide Monday.  However, will need to talk to pharmacy regarding the 3 days of amiodarone that she received as she may need longer for amiodarone wash-out.   - continue coreg 12.5 BID, can increase if needed - continue xarelto  # Hypertension - Stable BP now.  # EKG abnormalities - the patient was initially called as a code STEMI, but further review of EKG showed only  stable changes and old LBBB.  The patient notes no chest pain.  The patient had an exercise stress test 11/2012, which was stopped early due to fatigue, but showed no acute abnormalities. Cardiac cath 04/22/04 showed 30% stenosis of LAD at that time.  # Hypothyroidism - the patient has a history of hypothyroidism -continue synthroid  # Hypokalemia -  Repleted # Hyponatremia - Resolved # History of breast cancer - s/p chemo, radiation  Hodgkins lymphoma:  S/p XRT and chemo # Carotid Stenosis - 60-79% stenosis of L ICA seen on dopplers 02/12/13  Marca Ancona, M.D. 05/18/2013, 9:50 AM

## 2013-05-18 NOTE — Progress Notes (Signed)
05/18/13 1628  Vitals  ECG Heart Rate ! 179    Notified Dayna Dunn PA and will continue to assess and monitor.

## 2013-05-18 NOTE — Progress Notes (Signed)
Before initiating TIKOSYN therapy, previous antiarrhythmic therapy should be withdrawn for a  minimum of 3 plasma half-lives. TIKOSYN should not be initiated following amiodarone therapy until amiodarone has been withdrawn for at least 3 months or until amiodarone plasma levels are below 0.3 mcg/mL.  Reference: http://[email or web address].com/sites/[email or web address]/[email or web address]_Treatment_Guidelines.pdf  Please call Pharmacy with any further questions.  Thank you, Franchot Erichsen, Pharm.D. Clinical Pharmacist   Pager: [phone] 05/18/2013 10:07 AM

## 2013-05-18 NOTE — Progress Notes (Signed)
PULMONARY  / CRITICAL CARE MEDICINE  Name: [name] MRN: [id number] DOB: [birth year]    ADMISSION DATE:  05/09/2013 CONSULTATION DATE:  05/09/13  REFERRING MD :  EDP - Dr. Bebe Shaggy PRIMARY SERVICE: PCCM   BRIEF PATIENT DESCRIPTION: [name] who presented to [facility] ER on 5/22 with hypertensive crisis, pulmonary edema and subsequent cardiac arrest (PEA initial rhythm). RLL consolidation and strep Ag positive  SIGNIFICANT EVENTS / STUDIES:  5/22 - Admit with Hypertensive Crisis, Cardiac Arrest, Pulmonary Edema, PNA 5/22 CT head: NAD 5/22 CT chest: Bilateral pleural effusions. Peribronchial cuffing and areas of scattered ground-glass opacity, right lower lobe predominant, may reflect infection or edema 5/23 Echo: EF 55-60% 5/28 R thoracentesis. 1300 clear yellow fluid - transudate by chemistries 5/30 Transfer to SDU  LINES / TUBES: L [place] Portacath 04/16/12 ETT 5/22 >> 5/27 R IJ TLC 5/22 >>  R Fem A-line 5/22 >> 5/26  CULTURES: BCx2 5/22 >> NEG Sputum 5/24 >> NOF Urine 5/22, 5/23 >> NEG U. Strep 5/22>>>POS U. Legionella 5/22>>>neg R pleural fluid 5/28 >>    ANTIBIOTICS: Vanco 5/22>>>5/27 Ceftaz 5/22>>>5/27 Unasyn 5/27 >> 5/29 Augmentin 5/29 >> 6/1 (stop date ordered)   SUBJECTIVE:  Looks great and feels much better. No distress. No new complaints  VITAL SIGNS: Temp:  [98 F (36.7 C)-99.3 F (37.4 C)] 98.2 F (36.8 C) (05/31 0744) Pulse Rate:  [73-90] 88 (05/31 0305) Resp:  [17-22] 22 (05/31 0305) BP: (94-127)/(27-51) 118/45 mmHg (05/31 0305) SpO2:  [90 %-97 %] 97 % (05/31 0305)   INTAKE / OUTPUT: Intake/Output     05/30 0701 - 05/31 0700 05/31 0701 - 06/01 0700   P.O. 840    I.V. (mL/kg)     IV Piggyback     Total Intake(mL/kg) 840 (12.5)    Urine (mL/kg/hr) 3550 (2.2)    Total Output 3550     Net -2710          Stool Occurrence 1 x      PHYSICAL EXAMINATION: General: RASS 0, CAM-ICU neg Neuro: No focal deficits HEENT:  WNL Cardiovascular: RRR s  M Lungs: dull with diminished BS in B bases R>L Abdomen: soft, NT, NABS Ext: warm, no edema  LABS: BMET  Recent Labs Lab 05/13/13 0430 05/14/13 0355 05/15/13 0428 05/16/13 0500 05/17/13 0835  NA 133* 135 139 140 137  K 3.2* 3.1* 3.9 4.0 4.6  CL 96 97 98 98 94*  CO2 28 30 33* 35* 33*  GLUCOSE 98 125* 103* 127* 103*  BUN 16 20 19 21 21   CREATININE 0.80 0.70 0.55 0.54 0.56  CALCIUM 8.4 8.1* 9.1 9.1 9.2  MG  --  1.9  --   --   --     CBC  Recent Labs Lab 05/14/13 0355 05/15/13 0428 05/17/13 0835  HGB 9.5* 11.0* 12.1  HCT 27.0* 32.0* 36.3  WBC 12.1* 15.6* 14.8*  PLT 206 275 425*      Recent Labs Lab 05/14/13 1535 05/14/13 2155 05/14/13 2350 05/15/13 0409 05/15/13 0733  GLUCAP 143* 72 86 90 99    CXR: Dg Chest 2 View  05/17/2013   *RADIOLOGY REPORT*  Clinical Data: Short of breath.  Chest pressure.  CHEST - 2 VIEW  Comparison: 05/16/2013  Findings: Right jugular venous catheter removed.  Bilateral extensive airspace opacities have improved.  Left pleural effusion improved.  No pneumothorax.  IMPRESSION: Improved airspace disease and left pleural effusion.   Original Report Authenticated By: Jolaine Click, M.D.  ASSESSMENT / PLAN:  PULMONARY A:  Acute Respiratory Failure - multifactorial.  Pulmonary Edema - in setting of hypertensive crisis Aspiration PNA/ ALI -  Strep Ag positive Resp distress post extubation, resolved R Pleural effusion - tapped 5/28. Transudate by chemistries  P:   -No further Lasix planned -Keep I/Os even -Abx as above -F/u pleural fluid cultures  -wean O2 as able    CARDIOVASCULAR A:  Cardiac Arrest on admission - likely secondary to resp distress - PEA initial rhythm, required 7 min CPR in ER Hypertensive Emergency Labile hypertension H/O LBBB H/o PAF   P:  -Cont carvedilol, losartan -Cont PRN Hydralazine -Cont flecainide -Cards following -Start tikosyn Monday per cards   RENAL A:   Hypokalemia,  resolved  P:   -Replete as indicated -Keep I/Os even   GASTROINTESTINAL A:  Nausea  P:   -SUP not indicated -Advance nutrition as tolerated -Cont PRN ondansetron   HEMATOLOGIC / ONC A:   Hx of Breast CA / Hodgkins - s/p last Rx in 2013 - chemo RT to chest wall recurrence P:  Cont sub q hep   INFECTIOUS A:   Pneumococcal PNA Aspiration PNA - presumed with multiple episodes of vomiting  P:   -abx as above   ENDOCRINE A:   Hyperglycemia  - in setting of arrest. Resolved Hypothyroidisim -TSH 7 P:   -Monitor off SSI -continue synthroid  NEUROLOGIC A:   Acute Encephalopathy - in setting of arrest, resolved Anxiety P:   -Cont PRN Xanax  Tx to tele 5/31 and will ask [facility] to assume care 6/1.   Danford Bad, NP 05/18/2013  11:30 AM Pager: [phone] or [phone]  *Care during the described time interval was provided by me and/or other providers on the critical care team. I have reviewed this patient's available data, including medical history, events of note, physical examination and test results as part of my evaluation.  Patient seen and examined, agree with above note.  I dictated the care and orders written for this patient under my direction.  Alyson Reedy, MD [phone]

## 2013-05-19 LAB — BASIC METABOLIC PANEL
BUN: 17 mg/dL (ref 6–23)
CO2: 30 mEq/L (ref 19–32)
Calcium: 8.8 mg/dL (ref 8.4–10.5)
Creatinine, Ser: 0.65 mg/dL (ref 0.50–1.10)
GFR calc non Af Amer: 90 mL/min — ABNORMAL LOW (ref >90)
Glucose, Bld: 114 mg/dL — ABNORMAL HIGH (ref 70–99)

## 2013-05-19 LAB — BODY FLUID CULTURE: Culture: NO GROWTH

## 2013-05-19 LAB — CBC
MCH: 31 pg (ref 26.0–34.0)
MCHC: 33.3 g/dL (ref 30.0–36.0)
MCV: 93 fL (ref 78.0–100.0)
Platelets: 570 10*3/uL — ABNORMAL HIGH (ref 150–400)
RDW: 14.5 % (ref 11.5–15.5)

## 2013-05-19 LAB — PRO B NATRIURETIC PEPTIDE: Pro B Natriuretic peptide (BNP): 601.4 pg/mL — ABNORMAL HIGH (ref 0–125)

## 2013-05-19 MED ORDER — SENNOSIDES-DOCUSATE SODIUM 8.6-50 MG PO TABS
1.0000 | ORAL_TABLET | Freq: Two times a day (BID) | ORAL | Status: DC
  Filled 2013-05-19 (×10): qty 1

## 2013-05-19 MED ORDER — DILTIAZEM HCL ER COATED BEADS 120 MG PO CP24
120.0000 mg | ORAL_CAPSULE | Freq: Every day | ORAL | Status: DC
  Administered 2013-05-19 – 2013-05-22 (×4): 120 mg via ORAL
  Filled 2013-05-19 (×4): qty 1

## 2013-05-19 MED ORDER — FUROSEMIDE 40 MG PO TABS
40.0000 mg | ORAL_TABLET | Freq: Every day | ORAL | Status: DC
  Administered 2013-05-19 – 2013-05-22 (×4): 40 mg via ORAL
  Filled 2013-05-19 (×4): qty 1

## 2013-05-19 MED ORDER — SORBITOL 70 % SOLN
30.0000 mL | Freq: Every day | Status: DC | PRN
  Filled 2013-05-19: qty 60

## 2013-05-19 MED ORDER — POLYETHYLENE GLYCOL 3350 17 G PO PACK
17.0000 g | PACK | Freq: Every day | ORAL | Status: DC
  Filled 2013-05-19 (×5): qty 1

## 2013-05-19 NOTE — Progress Notes (Signed)
Report from Night RN. Chart reviewed together. Handoff complete.Introductions complete. Will continue to monitor and advise attending as needed.   

## 2013-05-19 NOTE — Progress Notes (Signed)
Patient ID: [name], female   DOB: 05/22/1946, 67 y.o.   MRN: [id number]    Subjective:  No dyspnea at rest.  In NSR.  Coughing up yellow sputum.  Good diuresis yesterday with IV Lasix.  She went into atrial fibrillation with RVR yesterday for a period of time, now back in NSR.   Objective:  Vital Signs in the last 24 hours: Temp:  [98.1 F (36.7 C)-99.1 F (37.3 C)] 98.8 F (37.1 C) (06/01 0749) Pulse Rate:  [62-95] 87 (06/01 0749) Resp:  [15-36] 22 (06/01 0749) BP: (92-145)/(39-62) 139/49 mmHg (06/01 0749) SpO2:  [92 %-100 %] 95 % (06/01 0749) Weight:  [138 lb 10.7 oz (62.9 kg)] 138 lb 10.7 oz (62.9 kg) (06/01 0500)  Intake/Output from previous day: 05/31 0701 - 06/01 0700 In: 1077 [P.O.:1077] Out: 2400 [Urine:2400]  Physical Exam: General: alert, cooperative, lying in bed in NAD Neck: supple, no lymphadenopathy, JVP not elevated Lungs: Decreased breath sounds at bases bilaterally Heart: regular rate and rhythm, no m/g/r Abdomen: soft, non-tender, non-distended, normal bowel sounds Extremities: no edema Neurologic: alert & oriented X3, cranial nerves II-XII intact, strength grossly intact, sensation intact to light touch  Lab Results:  Recent Labs  05/17/13 0835 05/19/13 0410  WBC 14.8* 11.3*  HGB 12.1 12.0  PLT 425* 570*    Recent Labs  05/18/13 1415 05/19/13 0410  NA 139 134*  K 4.4 4.3  CL 93* 93*  CO2 37* 30  GLUCOSE 144* 114*  BUN 18 17  CREATININE 0.63 0.65   No results found for this basename: TROPONINI, CK, MB,  in the last 72 hours  Cardiac Studies: Echocardiogram 05/10/13 - Left ventricle: The cavity size was normal. Wall thickness was normal. Systolic function was normal. The estimated ejection fraction was in the range of 55% to 60%. Regional wall motion abnormalities cannot be excluded. Doppler parameters are consistent with abnormal left ventricular relaxation (grade 1 diastolic dysfunction). Doppler parameters are consistent  with high ventricular filling pressure. - Pulmonary arteries: Systolic pressure was mildly increased. PA peak pressure: 36mm Hg (S). - Pericardium, extracardiac: A small pericardial effusion was identified.  Tele: NSR currently, had afib/RVR yesterday  Assessment/Plan:  The patient is a 67 yo woman, history of HTN, diastolic CHF, LBBB, paroxysmal afib presented with dyspnea and hypoxia, found to be hypertensive with pulmonary edema.   # Acute Respiratory Failure - The patient presented with dyspnea and acute respiratory failure, found to have diffuse pulmonary edema as well as a right-sided infiltrate concerning for aspiration pneumonia (witnessed vomiting by EMS).  Patient extubated 5/27, currently on O2 by [place].  There additionally appears to have been a component of acute/chronic diastolic CHF but she has diuresed reasonably well and now appears euvolemic.  - Change Lasix to po.  - Augmentin for aspiration PNA .  # Atrial fibrillation - the patient has a history of paroxysmal afib with CHADS2-Vasc score of 5. Patient on amiodarone 5/23-5/25, then restarted home flecainide 5/25.  The dose of flecainide was increased to 100 mg BID 5/29.  Flecainide was stopped on 5/30.  Plan has been to start dofetilide.  Will need flecainide to wash out for at least 3 half-lives, so could potentially start dofetilide Monday.  However, will need to talk with Dr. Graciela Husbands about her amiodarone use => was brief, but this could be an issue for dofetilide use.  I will send an amiodarone level but imagine it is a send-out.  Continue Coreg, add diltiazem CD 120  mg daily to try to control rate when in atrial fibrillation.  Continue Xarelto.   # Hypertension - Stable BP now.  # EKG abnormalities - the patient was initially called as a code STEMI, but further review of EKG showed only stable changes and old LBBB.  The patient notes no chest pain.  The patient had an exercise stress test 11/2012, which was stopped early due to  fatigue, but showed no acute abnormalities. Cardiac cath 04/22/04 showed 30% stenosis of LAD at that time.  # Hypothyroidism - the patient has a history of hypothyroidism -continue synthroid  # Hypokalemia - Repleted # Hyponatremia - Resolved # History of breast cancer - s/p chemo, radiation  Hodgkins lymphoma:  S/p XRT and chemo # Carotid Stenosis - 60-79% stenosis of L ICA seen on dopplers 02/12/13  Marca Ancona, M.D. 05/19/2013, 9:13 AM

## 2013-05-19 NOTE — Progress Notes (Addendum)
[facility] Progress Note [place] TEAM 1 - Stepdown/ICU TEAM   COLUMBIA PANDEY ZOX:[id number] DOB: Jan 16, 1946 DOA: 05/09/2013 PCP: Thora Lance, MD  Brief narrative: [name], former [employer], with PMH of NICM - EF of 45%, Afib, LBBB, HTN, Hypothyroidism, Hodgkins Dx in 1988 s/p XRT, breast cancer in 2009 with reoccurence in 2013 sp taxotere, cytoxan, XRT who according to husband, had been complaining of several days of shortness of breath, and "feeling like her lungs are filling up".  Patient called EMS herself and on arrival sats were 40%, pt cyanotic with agonal respirations. EMS did BVM with multiple episodes of vomiting in route. On arrival to ER sats were in 70's. Intubated on arrival. Was hypertensive and placed on NTG gtt and given lasix, versed / fentanyl. Became hypotensive with desaturations and arrested in ER with PEA arrest requiring 7 minutes of in house CPR with ROSC (given epi, calcium, bicarb x2). Pt then became hypertensive again after arrest (220/9=80's) and NTG continued. On arrival of Critical Care team to ER, patient became hypotensive and NTG stopped. Levophed started. PCCM admitted the patient.  SIGNIFICANT EVENTS / STUDIES:  5/22 - Admit with Hypertensive Crisis, Cardiac Arrest, Pulmonary Edema, PNA  ETT 5/22 >> 5/27 5/22 CT head: NAD  5/22 CT chest: Bilateral pleural effusions. Peribronchial cuffing and areas of scattered ground-glass opacity, right lower lobe predominant, may reflect infection or edema  5/23 Echo: EF 55-60%  5/28 R thoracentesis. 1300 clear yellow fluid - transudate by chemistries  5/30 Transfer to SDU  As of 05/19/2013 the patient has been transferred to the care of the [facility].  Assessment/Plan:  PEA arrest - status post CPR time 7 minutes Possibly respiratory driven - appears stable at this time  Acute respiratory failure in setting of cardiac arrest Resolved - extubated May 27  Pulmonary edema with right pleural effusion -  acute on chronic diastolic congestive heart failure Status post thoracentesis (transudate) - hemodynamically stable at this time - followup chest x-ray in a.m.  Aspiration pneumonia/pneumonitis + pneumococcal pneumonia U. Strep 5/22>>>POS - completing antibiotic course today  Malignant hypertension with hypertensive emergency Well-controlled at the present time  Tachycardia with history of Atrial fibrillation diagnosed 2008 CHADS2-VASc score of 5 - xarelto - cardiology is debating ongoing antiarrhythmic therapy  Nonischemic cardiomyopathy - ejection fraction 45% Cardiac cath 04/22/04 showed only 30% stenosis of LAD - cardiology following  Chronic left bundle branch block  Hypothyroidism TSH 7  Hodgkin's disease - diagnosed 1988 Status post XRT  History of breast cancer 2009 with recurrence 2013 Status post chemotherapy plus XRT  Raynaud's  L carotid stenosis at 60-79% (02/12/13)  Code Status: FULL Family Communication: No family present at time of exam Disposition Plan: SDU  Consultants: Cardiology - [place]  Antibiotics: Vanco 5/22>>>5/27  Ceftaz 5/22>>>5/27  Unasyn 5/27 >> 5/29  Augmentin 5/29 >> 6/1  DVT prophylaxis: xarelto  HPI/Subjective: The patient is sitting up in a bedside chair.  She appears comfortable.  She denies chest pain or shortness of breath at rest.  She does complain of significant dyspnea with exertion.  She denies nausea vomiting diarrhea or abdominal pain.  Objective: Blood pressure 139/49, pulse 87, temperature 98.8 F (37.1 C), temperature source Oral, resp. rate 22, height 5' 2.99" (1.6 m), weight 62.9 kg (138 lb 10.7 oz), last menstrual period 03/08/1995, SpO2 95.00%.  Intake/Output Summary (Last 24 hours) at 05/19/13 0852 Last data filed at 05/19/13 0700  Gross per 24 hour  Intake  837 ml  Output   2400 ml  Net  -1563 ml   Exam: General: No acute respiratory distress at rest Lungs: Mild bibasilar crackles with good air movement  throughout other fields with no wheeze Cardiovascular: Regular rate without appreciable murmur or gallop Abdomen: Nontender, nondistended, soft, bowel sounds positive, no rebound, no ascites, no appreciable mass Extremities: No significant cyanosis, clubbing, or edema bilateral lower extremities  Data Reviewed: Basic Metabolic Panel:  Recent Labs Lab 05/14/13 0355 05/15/13 0428 05/16/13 0500 05/17/13 0835 05/18/13 1415 05/19/13 0410  NA 135 139 140 137 139 134*  K 3.1* 3.9 4.0 4.6 4.4 4.3  CL 97 98 98 94* 93* 93*  CO2 30 33* 35* 33* 37* 30  GLUCOSE 125* 103* 127* 103* 144* 114*  BUN 20 19 21 21 18 17   CREATININE 0.70 0.55 0.54 0.56 0.63 0.65  CALCIUM 8.1* 9.1 9.1 9.2 9.2 8.8  MG 1.9  --   --   --   --   --    Liver Function Tests:  Recent Labs Lab 05/15/13 1356  PROT 6.1   CBC:  Recent Labs Lab 05/13/13 0430 05/14/13 0355 05/15/13 0428 05/17/13 0835 05/19/13 0410  WBC 15.7* 12.1* 15.6* 14.8* 11.3*  HGB 10.1* 9.5* 11.0* 12.1 12.0  HCT 28.7* 27.0* 32.0* 36.3 36.0  MCV 89.7 89.7 91.7 94.0 93.0  PLT 196 206 275 425* 570*   Cardiac Enzymes:  Recent Labs Lab 05/18/13 1415  CKTOTAL 21  CKMB 1.1   BNP (last 3 results)  Recent Labs  05/15/13 0428 05/16/13 0500 05/19/13 0410  PROBNP 2893.0* 3031.0* 601.4*   CBG:  Recent Labs Lab 05/14/13 2155 05/14/13 2350 05/15/13 0409 05/15/13 0733 05/18/13 1209  GLUCAP 72 86 90 99 124*    Recent Results (from the past 240 hour(s))  URINE CULTURE     Status: None   Collection Time    05/09/13  5:34 PM      Result Value Range Status   Specimen Description URINE, CATHETERIZED   Final   Special Requests NONE   Final   Culture  Setup Time 05/09/2013 18:09   Final   Colony Count NO GROWTH   Final   Culture NO GROWTH   Final   Report Status 05/11/2013 FINAL   Final  CULTURE, BLOOD (ROUTINE X 2)     Status: None   Collection Time    05/09/13  7:30 PM      Result Value Range Status   Specimen Description  BLOOD HAND RIGHT   Final   Special Requests BOTTLES DRAWN AEROBIC ONLY [facility]   Final   Culture  Setup Time 05/10/2013 01:45   Final   Culture NO GROWTH 5 DAYS   Final   Report Status 05/16/2013 FINAL   Final  CULTURE, BLOOD (ROUTINE X 2)     Status: None   Collection Time    05/09/13  7:45 PM      Result Value Range Status   Specimen Description BLOOD HAND LEFT   Final   Special Requests BOTTLES DRAWN AEROBIC ONLY [facility]   Final   Culture  Setup Time 05/10/2013 01:46   Final   Culture NO GROWTH 5 DAYS   Final   Report Status 05/16/2013 FINAL   Final  MRSA PCR SCREENING     Status: None   Collection Time    05/09/13  7:45 PM      Result Value Range Status   MRSA by PCR NEGATIVE  NEGATIVE Final   Comment:            The GeneXpert MRSA Assay (FDA     approved for NASAL specimens     only), is one component of a     comprehensive MRSA colonization     surveillance program. It is not     intended to diagnose MRSA     infection nor to guide or     monitor treatment for     MRSA infections.  URINE CULTURE     Status: None   Collection Time    05/10/13  4:38 AM      Result Value Range Status   Specimen Description URINE, CATHETERIZED   Final   Special Requests CX ADDED AT 0507 ON [id number]   Final   Culture  Setup Time 05/10/2013 05:11   Final   Colony Count NO GROWTH   Final   Culture NO GROWTH   Final   Report Status 05/11/2013 FINAL   Final  CULTURE, RESPIRATORY (NON-EXPECTORATED)     Status: None   Collection Time    05/11/13  8:15 PM      Result Value Range Status   Specimen Description ENDOTRACHEAL ASPIRATE   Final   Special Requests NONE   Final   Gram Stain     Final   Value: MODERATE WBC PRESENT,BOTH PMN AND MONONUCLEAR     NO SQUAMOUS EPITHELIAL CELLS SEEN     NO ORGANISMS SEEN   Culture Non-Pathogenic Oropharyngeal-type [place] Isolated.   Final   Report Status 05/14/2013 FINAL   Final  BODY FLUID CULTURE     Status: None   Collection Time    05/15/13  2:07 PM       Result Value Range Status   Specimen Description PLEURAL RIGHT FLUID   Final   Special Requests FLUID   Final   Gram Stain     Final   Value: RARE WBC PRESENT, PREDOMINANTLY PMN     NO SQUAMOUS EPITHELIAL CELLS SEEN     NO ORGANISMS SEEN   Culture NO GROWTH 2 DAYS   Final   Report Status PENDING   Incomplete     Studies:  Recent x-ray studies have been reviewed in detail by the Attending Physician  Scheduled Meds:  Scheduled Meds: . antiseptic oral rinse  15 mL Mouth Rinse BID  . carvedilol  12.5 mg Oral BID WC  . feeding supplement  237 mL Oral BID BM  . levothyroxine  125 mcg Oral QAC breakfast  . losartan  50 mg Oral Daily  . rivaroxaban  20 mg Oral Q supper    Time spent on care of this patient:   [facility] T  [facility] Office  [phone] Pager - Text Page per Loretha Stapler as per below:  On-Call/Text Page:      [email or web address]      password [email or web address]  If 7PM-7AM, please contact night-coverage [email or web address] Password [email or web address] 05/19/2013, 8:52 AM   LOS: 10 days

## 2013-05-20 ENCOUNTER — Inpatient Hospital Stay (HOSPITAL_COMMUNITY): Payer: Medicare Other

## 2013-05-20 LAB — CBC
MCH: 31.2 pg (ref 26.0–34.0)
MCHC: 34.4 g/dL (ref 30.0–36.0)
MCV: 90.7 fL (ref 78.0–100.0)
Platelets: 615 10*3/uL — ABNORMAL HIGH (ref 150–400)
RBC: 3.75 MIL/uL — ABNORMAL LOW (ref 3.87–5.11)

## 2013-05-20 LAB — BASIC METABOLIC PANEL
BUN: 19 mg/dL (ref 6–23)
CO2: 29 mEq/L (ref 19–32)
CO2: 30 mEq/L (ref 19–32)
Calcium: 8.7 mg/dL (ref 8.4–10.5)
Calcium: 8.9 mg/dL (ref 8.4–10.5)
Creatinine, Ser: 0.63 mg/dL (ref 0.50–1.10)
Creatinine, Ser: 0.64 mg/dL (ref 0.50–1.10)
GFR calc non Af Amer: >90 mL/min (ref >90)
Glucose, Bld: 116 mg/dL — ABNORMAL HIGH (ref 70–99)
Sodium: 132 mEq/L — ABNORMAL LOW (ref 135–145)

## 2013-05-20 LAB — MAGNESIUM: Magnesium: 2.2 mg/dL (ref 1.5–2.5)

## 2013-05-20 MED ORDER — DOFETILIDE 250 MCG PO CAPS
250.0000 ug | ORAL_CAPSULE | Freq: Two times a day (BID) | ORAL | Status: DC
  Administered 2013-05-20 – 2013-05-23 (×5): 250 ug via ORAL
  Filled 2013-05-20 (×8): qty 1

## 2013-05-20 MED ORDER — DOFETILIDE 500 MCG PO CAPS: 500.0000 ug | ORAL_CAPSULE | Freq: Two times a day (BID) | ORAL | Status: DC

## 2013-05-20 MED ORDER — DOFETILIDE 500 MCG PO CAPS
500.0000 ug | ORAL_CAPSULE | Freq: Two times a day (BID) | ORAL | Status: DC
  Administered 2013-05-20: 500 ug via ORAL
  Filled 2013-05-20 (×2): qty 1

## 2013-05-20 NOTE — Progress Notes (Signed)
QTC 3 hours post tikosyn is 534, MD aware.

## 2013-05-20 NOTE — Progress Notes (Signed)
Patient ID: [name], female   DOB: 20-Jul-1946, 67 y.o.   MRN: [id number]    Subjective:  No dyspnea at rest.  In NSR.  Coughing up brown sputum.  Good diuresis yesterday with IV Lasix.    Objective:  Vital Signs in the last 24 hours: Temp:  [98.1 F (36.7 C)-98.8 F (37.1 C)] 98.1 F (36.7 C) (06/02 0750) Pulse Rate:  [78-88] 79 (06/02 0330) Resp:  [18-22] 20 (06/02 0330) BP: (92-129)/(32-57) 129/57 mmHg (06/02 0330) SpO2:  [96 %-98 %] 98 % (06/02 0330) Weight:  [134 lb 0.6 oz (60.8 kg)] 134 lb 0.6 oz (60.8 kg) (06/02 0500)  Intake/Output from previous day: 06/01 0701 - 06/02 0700 In: 1080 [P.O.:1080] Out: 950 [Urine:950]  Physical Exam: General: alert, cooperative, lying in bed in NAD Neck: supple, no lymphadenopathy, JVP not elevated Lungs: Decreased breath sounds at bases bilaterally Heart: regular rate and rhythm, no m/g/r Abdomen: soft, non-tender, non-distended, normal bowel sounds Extremities: no edema Neurologic: alert & oriented X3, cranial nerves II-XII intact, strength grossly intact, sensation intact to light touch  Lab Results:  Recent Labs  05/19/13 0410 05/20/13 0400  WBC 11.3* 13.1*  HGB 12.0 11.7*  PLT 570* 615*    Recent Labs  05/19/13 0410 05/20/13 0400  NA 134* 132*  K 4.3 6.0*  CL 93* 95*  CO2 30 29  GLUCOSE 114* 116*  BUN 17 19  CREATININE 0.65 0.63   No results found for this basename: TROPONINI, CK, MB,  in the last 72 hours  Cardiac Studies: Echocardiogram 05/10/13 - Left ventricle: The cavity size was normal. Wall thickness was normal. Systolic function was normal. The estimated ejection fraction was in the range of 55% to 60%. Regional wall motion abnormalities cannot be excluded. Doppler parameters are consistent with abnormal left ventricular relaxation (grade 1 diastolic dysfunction). Doppler parameters are consistent with high ventricular filling pressure. - Pulmonary arteries: Systolic pressure was  mildly increased. PA peak pressure: 36mm Hg (S). - Pericardium, extracardiac: A small pericardial effusion was identified.  Tele: NSR currently, had afib/RVR yesterday  Assessment/Plan:  The patient is a 67 yo woman, history of HTN, diastolic CHF, LBBB, paroxysmal afib presented with dyspnea and hypoxia, found to be hypertensive with pulmonary edema.   # Acute Respiratory Failure - The patient presented with dyspnea and acute respiratory failure, found to have diffuse pulmonary edema as well as a right-sided infiltrate concerning for aspiration pneumonia (witnessed vomiting by EMS).  Patient extubated 5/27, currently on O2 by [place].  There additionally appears to have been a component of acute/chronic diastolic CHF but she has diuresed reasonably well and now appears euvolemic.  -  Lasix PO  - Augmentin for aspiration PNA .  # Atrial fibrillation - the patient has a history of paroxysmal afib with CHADS2-Vasc score of 5. Patient on amiodarone 5/23-5/25, then restarted home flecainide 5/25.  The dose of flecainide was increased to 100 mg BID 5/29.  Flecainide was stopped on 5/30.  Plan has been to start dofetilide.  Flecainide has washed out for 3 half lives. Discussed with Dr Graciela Husbands and he feels it is safe to start tikosyn.  Repeat BMET make sure K ok and hemolyzed.  No reason for elevation  # Hypertension - Stable BP now.  # EKG abnormalities - the patient was initially called as a code STEMI, but further review of EKG showed only stable changes and old LBBB.  The patient notes no chest pain.  The patient had an  exercise stress test 11/2012, which was stopped early due to fatigue, but showed no acute abnormalities. Cardiac cath 04/22/04 showed 30% stenosis of LAD at that time.  # Hypothyroidism - the patient has a history of hypothyroidism -continue synthroid  # Hypokalemia - Repleted # Hyponatremia - Resolved # History of breast cancer - s/p chemo, radiation  Hodgkins lymphoma:  S/p XRT and  chemo # Carotid Stenosis - 60-79% stenosis of L ICA seen on dopplers 02/12/13  Charlton Haws, M.D. 05/20/2013, 8:07 AM

## 2013-05-20 NOTE — Progress Notes (Signed)
Physical Therapy Treatment Patient Details Name: [name] MRN: [id number] DOB: 01-26-1946 Today's Date: 05/20/2013 Time: 2952-8413 PT Time Calculation (min): 25 min  PT Assessment / Plan / Recommendation Comments on Treatment Session  Pt progressing well and tolerating increased ambulation this date. Anticipate pt to be safe for d/c home when medically stable with use of RW, possibly O2 and 24/7 assist initially due to mild balance impairments, decreased activity tolerance, and generalized weakness.    Follow Up Recommendations  Home health PT;Supervision/Assistance - 24 hour     Does the patient have the potential to tolerate intense rehabilitation     Barriers to Discharge        Equipment Recommendations  Rolling walker with 5" wheels    Recommendations for Other Services    Frequency Min 3X/week   Plan Discharge plan remains appropriate    Precautions / Restrictions Precautions Precautions: Fall Restrictions Weight Bearing Restrictions: No   Pertinent Vitals/Pain C/o chest soarness from chest compression    Mobility  Bed Mobility Bed Mobility: Supine to Sit;Sitting - Scoot to Edge of Bed Supine to Sit: 4: Min guard;HOB elevated;With rails Sitting - Scoot to Edge of Bed: 5: Supervision Details for Bed Mobility Assistance: increased time due to chest soarness from compressions Transfers Transfers: Sit to Stand;Stand to Sit Sit to Stand: 4: Min guard;From bed Stand to Sit: 4: Min guard Details for Transfer Assistance: increased time, v/c's for safe hand placement for standing up to walker Ambulation/Gait Ambulation/Gait Assistance: 4: Min guard Ambulation Distance (Feet): 180 Feet Assistive device: Rolling walker Ambulation/Gait Assistance Details: no episodes of LOB, slow, steady pace Gait Pattern: Step-through pattern Gait velocity: improved from last week General Gait Details: SpO2 > 93% on 3Lo2 via [place] Stairs: No    Exercises     PT Diagnosis:    PT  Problem List:   PT Treatment Interventions:     PT Goals Acute Rehab PT Goals PT Goal: Supine/Side to Sit - Progress: Progressing toward goal PT Goal: Sit to Stand - Progress: Progressing toward goal PT Transfer Goal: Bed to Chair/Chair to Bed - Progress: Progressing toward goal PT Goal: Stand - Progress: Progressing toward goal PT Goal: Ambulate - Progress: Progressing toward goal  Visit Information  Last PT Received On: 05/20/13 Assistance Needed: +1    Subjective Data  Subjective: Pt received supine in bed eager to ambulate. Patient Stated Goal: go home   Cognition  Cognition Arousal/Alertness: Awake/alert Behavior During Therapy: WFL for tasks assessed/performed Overall Cognitive Status: Within Functional Limits for tasks assessed    Balance  Static Sitting Balance Static Sitting - Balance Support: No upper extremity supported;Feet supported Static Sitting - Level of Assistance: 5: Stand by assistance Static Sitting - Comment/# of Minutes: 8 min. pt supervision for donning pants, increased time due to chest soarness  End of Session PT - End of Session Equipment Utilized During Treatment: Gait belt;Oxygen Activity Tolerance: Patient tolerated treatment well Patient left: in chair;with call bell/phone within reach;with family/visitor present Nurse Communication: Mobility status   GP     Marcene Brawn 05/20/2013, 4:13 PM  Lewis Shock, PT, DPT Pager #: [phone] Office #: [phone]

## 2013-05-20 NOTE — Progress Notes (Signed)
K_ 6.0. MD aware. To repeat level.

## 2013-05-20 NOTE — Progress Notes (Signed)
[facility] Progress Note [place] TEAM 1 - Stepdown/ICU TEAM   ZAKYAH YANES VWU:[id number] DOB: February 26, 1946 DOA: 05/09/2013 PCP: Thora Lance, MD  Brief narrative: [name], former [employer], with PMH of NICM - EF of 45%, Afib, LBBB, HTN, Hypothyroidism, Hodgkins Dx in 1988 s/p XRT, breast cancer in 2009 with reoccurence in 2013 sp taxotere, cytoxan, XRT who according to husband, had been complaining of several days of shortness of breath, and "feeling like her lungs are filling up".  Patient called EMS herself and on arrival sats were 40%, pt cyanotic with agonal respirations. EMS did BVM with multiple episodes of vomiting in route. On arrival to ER sats were in 70's. Intubated on arrival. Was hypertensive and placed on NTG gtt and given lasix, versed / fentanyl. Became hypotensive with desaturations and arrested in ER with PEA arrest requiring 7 minutes of in house CPR with ROSC (given epi, calcium, bicarb x2). Pt then became hypertensive again after arrest (220/9=80's) and NTG continued. On arrival of Critical Care team to ER, patient became hypotensive and NTG stopped. Levophed started. PCCM admitted the patient.  SIGNIFICANT EVENTS / STUDIES:  5/22 - Admit with Hypertensive Crisis, Cardiac Arrest, Pulmonary Edema, PNA  ETT 5/22 >> 5/27 5/22 CT head: NAD  5/22 CT chest: Bilateral pleural effusions. Peribronchial cuffing and areas of scattered ground-glass opacity, right lower lobe predominant, may reflect infection or edema  5/23 Echo: EF 55-60%  5/28 R thoracentesis. 1300 clear yellow fluid - transudate by chemistries  5/30 Transfer to SDU  As of 05/19/2013 the patient has been transferred to the care of the [facility].  Assessment/Plan:  PEA arrest - status post CPR time 7 minutes Possibly respiratory driven - appears stable at this time  Acute respiratory failure in setting of cardiac arrest Resolved - extubated May 27  Pulmonary edema with right pleural effusion -  acute on chronic diastolic congestive heart failure Status post thoracentesis (transudate) - hemodynamically stable at this time - followup chest x-ray this morning reveals no significant change/stability  Aspiration pneumonia/pneumonitis + pneumococcal pneumonia U. Strep 5/22>>>POS - has completed a prescribe antibiotic course and is stable clinically  Malignant hypertension with hypertensive emergency Well-controlled at the present time  Tachycardia with history of Atrial fibrillation diagnosed 2008 CHADS2-VASc score of 5 - xarelto - cardiology is directing antiarrhythmic therapy and the patient is being dosed with tikosyn today  Nonischemic cardiomyopathy - ejection fraction 45% Cardiac cath 04/22/04 showed only 30% stenosis of LAD - cardiology following  Chronic left bundle branch block  Hypothyroidism TSH 7 - suggest no change in medication treatment during acute illness and recommend recheck TSH in 6-8 weeks  Hodgkin's disease - diagnosed 1988 Status post XRT  History of breast cancer 2009 with recurrence 2013 Status post chemotherapy plus XRT  Raynaud's  L carotid stenosis at 60-79% (02/12/13)  Code Status: FULL Family Communication: No family present at time of exam Disposition Plan: SDU  Consultants: Cardiology - [place]  Antibiotics: Vanco 5/22>>>5/27  Ceftaz 5/22>>>5/27  Unasyn 5/27 >> 5/29  Augmentin 5/29 >> 6/1  DVT prophylaxis: xarelto  HPI/Subjective: The patient has no new complaints today.  She denies fevers chills nausea vomiting or shortness of breath.  Objective: Blood pressure 105/45, pulse 69, temperature 97.9 F (36.6 C), temperature source Oral, resp. rate 20, height 5' 2.99" (1.6 m), weight 60.8 kg (134 lb 0.6 oz), last menstrual period 03/08/1995, SpO2 100.00%.  Intake/Output Summary (Last 24 hours) at 05/20/13 1654 Last data filed at  05/20/13 1600  Gross per 24 hour  Intake    720 ml  Output   1450 ml  Net   -730 ml   Exam: General:  No acute respiratory distress at rest Lungs: Mild bibasilar crackles with good air movement throughout other fields with no wheeze Cardiovascular: Regular rate without appreciable murmur or gallop Abdomen: Nontender, nondistended, soft, bowel sounds positive, no rebound, no ascites, no appreciable mass Extremities: No significant cyanosis, clubbing, edema bilateral lower extremities  Data Reviewed: Basic Metabolic Panel:  Recent Labs Lab 05/14/13 0355  05/17/13 0835 05/18/13 1415 05/19/13 0410 05/20/13 0400 05/20/13 0830  NA 135  < > 137 139 134* 132* 136  K 3.1*  < > 4.6 4.4 4.3 6.0* 4.1  CL 97  < > 94* 93* 93* 95* 95*  CO2 30  < > 33* 37* 30 29 30   GLUCOSE 125*  < > 103* 144* 114* 116* 106*  BUN 20  < > 21 18 17 19 17   CREATININE 0.70  < > 0.56 0.63 0.65 0.63 0.64  CALCIUM 8.1*  < > 9.2 9.2 8.8 8.7 8.9  MG 1.9  --   --   --   --   --  2.2  < > = values in this interval not displayed. Liver Function Tests:  Recent Labs Lab 05/15/13 1356  PROT 6.1   CBC:  Recent Labs Lab 05/14/13 0355 05/15/13 0428 05/17/13 0835 05/19/13 0410 05/20/13 0400  WBC 12.1* 15.6* 14.8* 11.3* 13.1*  HGB 9.5* 11.0* 12.1 12.0 11.7*  HCT 27.0* 32.0* 36.3 36.0 34.0*  MCV 89.7 91.7 94.0 93.0 90.7  PLT 206 275 425* 570* 615*   Cardiac Enzymes:  Recent Labs Lab 05/18/13 1415  CKTOTAL 21  CKMB 1.1   BNP (last 3 results)  Recent Labs  05/15/13 0428 05/16/13 0500 05/19/13 0410  PROBNP 2893.0* 3031.0* 601.4*   CBG:  Recent Labs Lab 05/14/13 2155 05/14/13 2350 05/15/13 0409 05/15/13 0733 05/18/13 1209  GLUCAP 72 86 90 99 124*    Recent Results (from the past 240 hour(s))  CULTURE, RESPIRATORY (NON-EXPECTORATED)     Status: None   Collection Time    05/11/13  8:15 PM      Result Value Range Status   Specimen Description ENDOTRACHEAL ASPIRATE   Final   Special Requests NONE   Final   Gram Stain     Final   Value: MODERATE WBC PRESENT,BOTH PMN AND MONONUCLEAR     NO  SQUAMOUS EPITHELIAL CELLS SEEN     NO ORGANISMS SEEN   Culture Non-Pathogenic Oropharyngeal-type Flora Isolated.   Final   Report Status 05/14/2013 FINAL   Final  BODY FLUID CULTURE     Status: None   Collection Time    05/15/13  2:07 PM      Result Value Range Status   Specimen Description PLEURAL RIGHT FLUID   Final   Special Requests FLUID   Final   Gram Stain     Final   Value: RARE WBC PRESENT, PREDOMINANTLY PMN     NO SQUAMOUS EPITHELIAL CELLS SEEN     NO ORGANISMS SEEN   Culture NO GROWTH 3 DAYS   Final   Report Status 05/19/2013 FINAL   Final     Studies:  Recent x-ray studies have been reviewed in detail by the Attending Physician  Scheduled Meds:  Scheduled Meds: . antiseptic oral rinse  15 mL Mouth Rinse BID  . carvedilol  12.5 mg Oral  BID WC  . diltiazem  120 mg Oral Daily  . dofetilide  250 mcg Oral Q12H  . feeding supplement  237 mL Oral BID BM  . furosemide  40 mg Oral Daily  . levothyroxine  125 mcg Oral QAC breakfast  . losartan  50 mg Oral Daily  . polyethylene glycol  17 g Oral Daily  . rivaroxaban  20 mg Oral Q supper  . senna-docusate  1 tablet Oral BID    Time spent on care of this patient:   Kemoni Quesenberry T  [facility] Office  [phone] Pager - Text Page per Loretha Stapler as per below:  On-Call/Text Page:      [email or web address]      password [email or web address]  If 7PM-7AM, please contact night-coverage [email or web address] Password [email or web address] 05/20/2013, 4:54 PM   LOS: 11 days

## 2013-05-20 NOTE — Progress Notes (Signed)
Patient ID: [name], female   DOB: 29-May-1946, 67 y.o.   MRN: [id number] Repeat K was 4.1 with normal Cr After first dose of tikosyn QT increased to 518 Reviewed with Dr Graciela Husbands Will reduce next dose to 250ug.  Continue to monitor Discussed with nurse  Charlton Haws

## 2013-05-20 NOTE — Progress Notes (Signed)
QTC -483 with 12 lead ekg. Dr. Eden Emms made aware, gave order to give tikosyn.

## 2013-05-21 LAB — BASIC METABOLIC PANEL
GFR calc non Af Amer: 89 mL/min — ABNORMAL LOW (ref >90)
Glucose, Bld: 131 mg/dL — ABNORMAL HIGH (ref 70–99)
Potassium: 4.7 mEq/L (ref 3.5–5.1)
Sodium: 135 mEq/L (ref 135–145)

## 2013-05-21 LAB — CBC
HCT: 36.1 % (ref 36.0–46.0)
Hemoglobin: 12.4 g/dL (ref 12.0–15.0)
RDW: 14.4 % (ref 11.5–15.5)
WBC: 12.3 10*3/uL — ABNORMAL HIGH (ref 4.0–10.5)

## 2013-05-21 NOTE — Progress Notes (Signed)
Pt's PIV not flushing & out of date; IV team paged;

## 2013-05-21 NOTE — Progress Notes (Signed)
Pt ambulated 200 feet with minimal assistance without walker; held onto my arm as a contact guard. Pts oxygen saturation ranged between 93-98% on room air; pt states she felt good walking;

## 2013-05-21 NOTE — Progress Notes (Signed)
Physical Therapy Treatment Patient Details Name: [name] MRN: [id number] DOB: Apr 09, 1946 Today's Date: 05/21/2013 Time: 1227-1251 PT Time Calculation (min): 24 min  PT Assessment / Plan / Recommendation Comments on Treatment Session  Pt with minor set back this date due to increased fatigue. However pt took shower with assist from RN this AM using increased energy compared to previous days. Pt did tolerate amb on RA, SpO2 >89% however pt did feel more unsteady this date and was unable to amb safely withour RW. Discussed energy conservation technique, pt very accepting and eager for information. Pt to con't to benefit from acute PT and HHPT upon d/c to achieve safe mod I function at home to decrease burden of care on family.    Follow Up Recommendations  Home health PT;Supervision/Assistance - 24 hour     Does the patient have the potential to tolerate intense rehabilitation     Barriers to Discharge        Equipment Recommendations  Rolling walker with 5" wheels    Recommendations for Other Services    Frequency Min 3X/week   Plan Discharge plan remains appropriate    Precautions / Restrictions Precautions Precautions: Fall Restrictions Weight Bearing Restrictions: No   Pertinent Vitals/Pain Pt c/o chest soreness from compressions    Mobility  Bed Mobility Bed Mobility: Rolling Right;Right Sidelying to Sit Rolling Right: 6: Modified independent (Device/Increase time) (increased time due to chest soarness) Right Sidelying to Sit: 4: Min guard;HOB flat (mimic home set up) Details for Bed Mobility Assistance: increased time, pt reports "I think i might faint"upon sitting up at EOB Transfers Transfers: Sit to Stand;Stand to Sit Sit to Stand: 4: Min guard;From bed Stand to Sit: 4: Min guard Details for Transfer Assistance: pt had to sit eob x 7 min prior to ambulating due to "i'm starting to see black and hot." 2Lo2 re-applied, RN called into room to assess pt. Pt  remained conscience.  Ambulation/Gait Ambulation/Gait Assistance: 4: Min guard Ambulation Distance (Feet): 200 Feet Assistive device: Rolling walker Ambulation/Gait Assistance Details: pt with increased instability this time compared to yesterday. Pt reports "my legs feel heavy today." Attempted to amb wihthout RW however pt felt unsafe and weak. Pt able to maintain SpO2 >89% on RA during ambulation Gait Pattern: Step-through pattern Stairs: No    Exercises     PT Diagnosis:    PT Problem List:   PT Treatment Interventions:     PT Goals Acute Rehab PT Goals PT Goal: Supine/Side to Sit - Progress: Progressing toward goal PT Goal: Sit to Stand - Progress: Progressing toward goal PT Transfer Goal: Bed to Chair/Chair to Bed - Progress: Progressing toward goal PT Goal: Stand - Progress: Progressing toward goal PT Goal: Ambulate - Progress: Progressing toward goal  Visit Information  Last PT Received On: 05/21/13 Assistance Needed: +1    Subjective Data  Subjective: Pt received supine in bed. RN reports to trial ambulation on RA. pt SpO2 at 91% on RA at rest in bed. Patient Stated Goal: home   Cognition  Cognition Arousal/Alertness: Awake/alert Behavior During Therapy: WFL for tasks assessed/performed Overall Cognitive Status: Within Functional Limits for tasks assessed    Balance  Static Sitting Balance Static Sitting - Balance Support: No upper extremity supported;Feet supported Static Sitting - Level of Assistance: 5: Stand by assistance Static Sitting - Comment/# of Minutes: 7 min, donned pants  End of Session PT - End of Session Equipment Utilized During Treatment: Gait belt;Oxygen Activity Tolerance: Patient limited  by fatigue Patient left: in chair;with call bell/phone within reach Nurse Communication: Mobility status (amb with pt 3x/day, RW left in room)   GP     Marcene Brawn 05/21/2013, 3:29 PM  Lewis Shock, PT, DPT Pager #: [phone] Office #:  [phone]

## 2013-05-21 NOTE — Progress Notes (Signed)
Pt third dose of Tikosyn due this PM. EKG done and QTc . MD on call, [facility] paged. Ordered to hold dose this PM and re-assess EKG before dose tomorrow AM at 10AM. Baron Hamper, RN 05/21/2013 9:36 PM

## 2013-05-21 NOTE — Progress Notes (Signed)
Patient ID: [name], female   DOB: 06-Nov-1946, 67 y.o.   MRN: [id number]    Subjective:  No dyspnea at rest.  In NSR.  Ambulated yesterday QT prolonged on first 500ug dose and Tikosyn Decreased to 250  This AM QT 502   Objective:  Vital Signs in the last 24 hours: Temp:  [98.1 F (36.7 C)-98.8 F (37.1 C)] 98.1 F (36.7 C) (06/02 0750) Pulse Rate:  [78-88] 79 (06/02 0330) Resp:  [18-22] 20 (06/02 0330) BP: (92-129)/(32-57) 129/57 mmHg (06/02 0330) SpO2:  [96 %-98 %] 98 % (06/02 0330) Weight:  [134 lb 0.6 oz (60.8 kg)] 134 lb 0.6 oz (60.8 kg) (06/02 0500)  Intake/Output from previous day: 06/01 0701 - 06/02 0700 In: 1080 [P.O.:1080] Out: 950 [Urine:950]  Physical Exam: General: alert, cooperative, lying in bed in NAD Neck: supple, no lymphadenopathy, JVP not elevated Lungs: Decreased breath sounds at bases bilaterally Heart: regular rate and rhythm, no m/g/r Abdomen: soft, non-tender, non-distended, normal bowel sounds Extremities: no edema Neurologic: alert & oriented X3, cranial nerves II-XII intact, strength grossly intact, sensation intact to light touch  Lab Results:  Recent Labs  05/19/13 0410 05/20/13 0400  WBC 11.3* 13.1*  HGB 12.0 11.7*  PLT 570* 615*    Recent Labs  05/19/13 0410 05/20/13 0400  NA 134* 132*  K 4.3 6.0*  CL 93* 95*  CO2 30 29  GLUCOSE 114* 116*  BUN 17 19  CREATININE 0.65 0.63   No results found for this basename: TROPONINI, CK, MB,  in the last 72 hours  Cardiac Studies: Echocardiogram 05/10/13 - Left ventricle: The cavity size was normal. Wall thickness was normal. Systolic function was normal. The estimated ejection fraction was in the range of 55% to 60%. Regional wall motion abnormalities cannot be excluded. Doppler parameters are consistent with abnormal left ventricular relaxation (grade 1 diastolic dysfunction). Doppler parameters are consistent with high ventricular filling pressure. - Pulmonary arteries:  Systolic pressure was mildly increased. PA peak pressure: 36mm Hg (S). - Pericardium, extracardiac: A small pericardial effusion was identified.  Tele: NSR currently, had afib/RVR yesterday  Assessment/Plan:  The patient is a 67 yo woman, history of HTN, diastolic CHF, LBBB, paroxysmal afib presented with dyspnea and hypoxia, found to be hypertensive with pulmonary edema.   # Acute Respiratory Failure - The patient presented with dyspnea and acute respiratory failure, found to have diffuse pulmonary edema as well as a right-sided infiltrate concerning for aspiration pneumonia (witnessed vomiting by EMS).  Patient extubated 5/27, currently on O2 by [place].  There additionally appears to have been a component of acute/chronic diastolic CHF but she has diuresed reasonably well and now appears euvolemic.  -  Lasix PO  - Augmentin for aspiration PNA .  # Atrial fibrillation - the patient has a history of paroxysmal afib with CHADS2-Vasc score of 5. Patient on amiodarone 5/23-5/25, then restarted home flecainide 5/25.  The dose of flecainide was increased to 100 mg BID 5/29.  Flecainide was stopped on 5/30.  Day 2 tikosyn dose now 250 bid Contineu to follow QT # Hypertension - Stable BP now.  # EKG abnormalities - the patient was initially called as a code STEMI, but further review of EKG showed only stable changes and old LBBB.  The patient notes no chest pain.  The patient had an exercise stress test 11/2012, which was stopped early due to fatigue, but showed no acute abnormalities. Cardiac cath 04/22/04 showed 30% stenosis of LAD at that  time.  # Hypothyroidism - the patient has a history of hypothyroidism -continue synthroid  # Hypokalemia - Repleted # Hyponatremia - Resolved # History of breast cancer - s/p chemo, radiation  Hodgkins lymphoma:  S/p XRT and chemo # Carotid Stenosis - 60-79% stenosis of L ICA seen on dopplers 02/12/13  Charlton Haws, M.D. 05/20/2013, 8:07 AM

## 2013-05-21 NOTE — Progress Notes (Signed)
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  Patient called EMS herself and on arrival sats were 40%, pt cyanotic with agonal respirations. EMS did BVM with multiple episodes of vomiting in route. On arrival to ER sats were in 70's. Intubated on arrival. Was hypertensive and placed on NTG gtt and given lasix, versed / fentanyl. Became hypotensive with desaturations and arrested in ER with PEA arrest requiring 7 minutes of in house CPR with ROSC (given epi, calcium, bicarb x2). Pt then became hypertensive again after arrest (220/9=80's) and NTG continued. On arrival of [facility] Care [name] to ER, patient became hypotensive and NTG stopped. Levophed started. PCCM admitted the patient.  SIGNIFICANT EVENTS / STUDIES:  5/22 - Admit with Hypertensive Crisis, Cardiac Arrest, Pulmonary Edema, PNA  ETT 5/22 >> 5/27 5/22 CT head: NAD  5/22 CT chest: Bilateral pleural effusions. Peribronchial cuffing and areas of scattered ground-glass opacity, right lower lobe predominant, may reflect infection or edema  5/23 Echo: EF 55-60%  5/28 R thoracentesis. 1300 clear yellow fluid - transudate by chemistries  5/30 Transfer to SDU  As of 05/19/2013 the patient has been transferred to the care of the [facility].  Assessment/Plan:  PEA arrest - status post CPR time 7 minutes Possibly respiratory driven - appears stable at this time  Acute respiratory failure in setting of cardiac arrest Resolved - extubated May 27  Pulmonary edema with right pleural effusion -  acute on chronic diastolic congestive heart failure Status post thoracentesis (transudate) - hemodynamically stable at this time - followup chest x-ray this morning reveals no significant change/stability  Aspiration pneumonia/pneumonitis + pneumococcal pneumonia U. Strep 5/22>>>POS - has completed a prescribe antibiotic course and is stable clinically  Malignant hypertension with hypertensive emergency Well-controlled at the present time  Tachycardia with history of Atrial fibrillation diagnosed 2008 CHADS2-VASc score of 5 - xarelto - cardiology is directing antiarrhythmic therapy - tikosyn being modified today  Nonischemic cardiomyopathy - ejection fraction 45% Cardiac cath 04/22/04 showed only 30% stenosis of LAD - cardiology following  Chronic left bundle branch block  Hypothyroidism TSH 7 - suggest no change in medication treatment during acute illness and recommend recheck TSH in 6-8 weeks  Hodgkin's disease - diagnosed 1988 Status post XRT  History of breast cancer 2009 with recurrence 2013 Status post chemotherapy plus XRT  Raynaud's  L carotid stenosis at 60-79% (02/12/13)  Code Status: FULL Family Communication: No family present at time of exam Disposition Plan: SDU  Consultants: Cardiology - [place]  Antibiotics: Vanco 5/22>>>5/27  Ceftaz 5/22>>>5/27  Unasyn 5/27 >> 5/29  Augmentin 5/29 >> 6/1  DVT prophylaxis: xarelto  HPI/Subjective: The patient has no complaints.  Objective: Blood pressure 94/48, pulse 71, temperature 98.7 F (37.1 C), temperature source Oral, resp. rate 20, height 5' 2.99" (1.6 m), weight 60.8 kg (134 lb 0.6 oz), last menstrual period 03/08/1995, SpO2 95.00%.  Intake/Output Summary (Last 24 hours) at 05/21/13 1423 Last data filed at 05/21/13 1215  Gross per 24 hour  Intake   1000 ml  Output  1585 ml  Net   -585 ml   Exam: General: No acute respiratory distress at rest Lungs: Mild bibasilar crackles with good air movement  throughout other fields with no wheeze Cardiovascular: Regular rate without appreciable murmur or gallop Abdomen: Nontender, nondistended, soft, bowel sounds positive, no rebound, no ascites, no appreciable mass Extremities: No significant cyanosis, clubbing, edema bilateral lower extremities  Data Reviewed: Basic Metabolic Panel:  Recent Labs Lab 05/18/13 1415 05/19/13 0410 05/20/13 0400 05/20/13 0830 05/21/13 0408  NA 139 134* 132* 136 135  K 4.4 4.3 6.0* 4.1 4.7  CL 93* 93* 95* 95* 95*  CO2 37* 30 29 30 30   GLUCOSE 144* 114* 116* 106* 131*  BUN 18 17 19 17 18   CREATININE 0.63 0.65 0.63 0.64 0.66  CALCIUM 9.2 8.8 8.7 8.9 9.1  MG  --   --   --  2.2 2.2   Liver Function Tests:  Recent Labs Lab 05/15/13 1356  PROT 6.1   CBC:  Recent Labs Lab 05/15/13 0428 05/17/13 0835 05/19/13 0410 05/20/13 0400 05/21/13 0408  WBC 15.6* 14.8* 11.3* 13.1* 12.3*  HGB 11.0* 12.1 12.0 11.7* 12.4  HCT 32.0* 36.3 36.0 34.0* 36.1  MCV 91.7 94.0 93.0 90.7 92.1  PLT 275 425* 570* 615* 749*   Cardiac Enzymes:  Recent Labs Lab 05/18/13 1415  CKTOTAL 21  CKMB 1.1   BNP (last 3 results)  Recent Labs  05/15/13 0428 05/16/13 0500 05/19/13 0410  PROBNP 2893.0* 3031.0* 601.4*   CBG:  Recent Labs Lab 05/14/13 2155 05/14/13 2350 05/15/13 0409 05/15/13 0733 05/18/13 1209  GLUCAP 72 86 90 99 124*    Recent Results (from the past 240 hour(s))  CULTURE, RESPIRATORY (NON-EXPECTORATED)     Status: None   Collection Time    05/11/13  8:15 PM      Result Value Range Status   Specimen Description ENDOTRACHEAL ASPIRATE   Final   Special Requests NONE   Final   Gram Stain     Final   Value: MODERATE WBC PRESENT,BOTH PMN AND MONONUCLEAR     NO SQUAMOUS EPITHELIAL CELLS SEEN     NO ORGANISMS SEEN   Culture Non-Pathogenic Oropharyngeal-type Flora Isolated.   Final   Report Status 05/14/2013 FINAL   Final  BODY FLUID CULTURE     Status: None   Collection Time    05/15/13  2:07  PM      Result Value Range Status   Specimen Description PLEURAL RIGHT FLUID   Final   Special Requests FLUID   Final   Gram Stain     Final   Value: RARE WBC PRESENT, PREDOMINANTLY PMN     NO SQUAMOUS EPITHELIAL CELLS SEEN     NO ORGANISMS SEEN   Culture NO GROWTH 3 DAYS   Final   Report Status 05/19/2013 FINAL   Final     Studies:  Recent x-ray studies have been reviewed in detail by the Attending Physician  Scheduled Meds:  Scheduled Meds: . antiseptic oral rinse  15 mL Mouth Rinse BID  . carvedilol  12.5 mg Oral BID WC  . diltiazem  120 mg Oral Daily  . dofetilide  250 mcg Oral Q12H  . feeding supplement  237 mL Oral BID BM  . furosemide  40 mg Oral Daily  . levothyroxine  125 mcg Oral QAC breakfast  . losartan  50 mg Oral Daily  . polyethylene glycol  17 g Oral Daily  . rivaroxaban  20  mg Oral Q supper  . senna-docusate  1 tablet Oral BID    Time spent on care of this patient:   Bryndan Bilyk, MD [phone]  [facility] Office  [phone] Pager - Text Page per Amion as per below:  On-Call/Text Page:      [email or web address]      password [email or web address]  If 7PM-7AM, please contact night-coverage [email or web address] Password [email or web address] 05/21/2013, 2:23 PM   LOS: 12 days

## 2013-05-21 NOTE — Progress Notes (Signed)
NUTRITION FOLLOW UP  Intervention:   1. Ensure Complete po BID, each supplement provides 350 kcal and 13 grams of protein.    Nutrition Dx:   Inadequate oral intake now related to limited ability to eat as evidenced by Bipap. Resolved.   Goal:   Meet >/=90% estimated nutrition needs. Met  Monitor:   PO intake, weight trends, labs   Assessment:   Reports good appetite, no nutrition needs at this time.     Height: Ht Readings from Last 1 Encounters:  05/09/13 5' 2.99" (1.6 m)    Weight Status:   Wt Readings from Last 1 Encounters:  05/20/13 134 lb 0.6 oz (60.8 kg)    Re-estimated needs:  Kcal: 1400-1600 Protein: 70-80 gm  Fluid: 1.4-1.6 L   Skin: intact   Diet Order: General   Intake/Output Summary (Last 24 hours) at 05/21/13 1043 Last data filed at 05/21/13 1000  Gross per 24 hour  Intake   1000 ml  Output   1610 ml  Net   -610 ml    Last BM: 6/3   Labs:   Recent Labs Lab 05/20/13 0400 05/20/13 0830 05/21/13 0408  NA 132* 136 135  K 6.0* 4.1 4.7  CL 95* 95* 95*  CO2 29 30 30   BUN 19 17 18   CREATININE 0.63 0.64 0.66  CALCIUM 8.7 8.9 9.1  MG  --  2.2 2.2  GLUCOSE 116* 106* 131*    CBG (last 3)   Recent Labs  05/18/13 1209  GLUCAP 124*    Scheduled Meds: . antiseptic oral rinse  15 mL Mouth Rinse BID  . carvedilol  12.5 mg Oral BID WC  . diltiazem  120 mg Oral Daily  . dofetilide  250 mcg Oral Q12H  . feeding supplement  237 mL Oral BID BM  . furosemide  40 mg Oral Daily  . levothyroxine  125 mcg Oral QAC breakfast  . losartan  50 mg Oral Daily  . polyethylene glycol  17 g Oral Daily  . rivaroxaban  20 mg Oral Q supper  . senna-docusate  1 tablet Oral BID    Continuous Infusions:    Clarene Duke RD, LDN Pager [phone] After Hours pager [phone]

## 2013-05-21 NOTE — Progress Notes (Signed)
Cardiology Cross-Cover note: Called regarding EKG performed prior to 3rd tikosyn dose. Qtc of 500+ ms on pt with LBBB. EKG this AM also with prolonged QTc of 500 and dose reduced to 250 mg. Given no improvement in Qtc, prudent approach is to hold dose this evening and re-assess QTc in AM prior to 10 am dose.

## 2013-05-22 LAB — BASIC METABOLIC PANEL
GFR calc Af Amer: >90 mL/min (ref >90)
GFR calc non Af Amer: 89 mL/min — ABNORMAL LOW (ref >90)
Glucose, Bld: 109 mg/dL — ABNORMAL HIGH (ref 70–99)
Potassium: 4.1 mEq/L (ref 3.5–5.1)
Sodium: 137 mEq/L (ref 135–145)

## 2013-05-22 LAB — GLUCOSE, CAPILLARY: Glucose-Capillary: 123 mg/dL — ABNORMAL HIGH (ref 70–99)

## 2013-05-22 MED ORDER — LOSARTAN POTASSIUM 25 MG PO TABS
25.0000 mg | ORAL_TABLET | Freq: Every day | ORAL | Status: DC
  Administered 2013-05-23: 25 mg via ORAL
  Filled 2013-05-22: qty 1

## 2013-05-22 MED ORDER — SODIUM CHLORIDE 0.9 % IV BOLUS (SEPSIS): 250.0000 mL | Freq: Once | INTRAVENOUS | Status: DC

## 2013-05-22 MED ORDER — CARVEDILOL 6.25 MG PO TABS
6.2500 mg | ORAL_TABLET | Freq: Two times a day (BID) | ORAL | Status: DC
  Administered 2013-05-23: 6.25 mg via ORAL
  Filled 2013-05-22 (×5): qty 1

## 2013-05-22 NOTE — Progress Notes (Signed)
05/22/13 1530 In to speak to pt. about home health services, and as well as giving pt. list of agencies to chose from.  Pt. chose [facility].  Pt. would like a rolling walker.  Pt. is a retired Charity fundraiser, and she states her sister, who is a Charity fundraiser will stay with her for the first few days pt. is out of hospital.  Therefore, the pt. is only interested in having HH PT, not HH RN.  TC to [place], with [facility], to give referral for [facility] PT.  In addition, pt. would like for this NCM to call her spouse, Donnie at [phone]).  Spoke with Donnie, and gave him an update on discharge planning.  Donnie states that the pt. will have private duty nursing once she is home as well.  Pt. anticipated to dc tomorrow. Tera Mater, RN, BSN NCM [phone]

## 2013-05-22 NOTE — Progress Notes (Signed)
Unable to start 250cc bolus due to IV occlusion. IV team notified.

## 2013-05-22 NOTE — Progress Notes (Signed)
Unable to administer bolus due to occlusion of IV. PO fluids given BP is 130/59. Patient stated that she is feeling better. PA notified. Ok with holding bolus. Will continue to monitor patient for further changes in condition.

## 2013-05-22 NOTE — Progress Notes (Signed)
PT Cancellation Note  Patient Details Name: [name] MRN: [id number] DOB: [birth year]   Cancelled Treatment:    Reason Eval/Treat Not Completed: Medical issues which prohibited therapy. RN asked to hold due to pt with extremely low BP and unable to provide fluid bolus due to inability of IV line placement. PT to re-attempt as able.   Marcene Brawn 05/22/2013, 4:23 PM

## 2013-05-22 NOTE — Plan of Care (Signed)
Problem: Phase I Progression Outcomes Goal: Pain controlled with appropriate interventions Outcome: Progressing Pt still complaining of soreness over chest area from chest compressions that were done this admission. Pain controlled with tylenol.

## 2013-05-22 NOTE — Progress Notes (Addendum)
Patient BP is 80/38 and CBG is 123. She is symptomatic. Complaint of feeling lightheaded. PA notified and new orders given. Will continue to monitor patient for further changes in condition.

## 2013-05-22 NOTE — Progress Notes (Signed)
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 Patient called EMS herself and on arrival sats were 40%, pt cyanotic with agonal respirations. EMS did BVM with multiple episodes of vomiting in route. On arrival to ER sats were in 70's. Intubated on arrival. Was hypertensive and placed on NTG gtt and given lasix, versed / fentanyl. Became hypotensive with desaturations and arrested in ER with PEA arrest requiring 7 minutes of in house CPR with ROSC (given epi, calcium, bicarb x2). Pt then became hypertensive again after arrest (220/9=80's) and NTG continued. On arrival of Critical Care team to ER, patient became hypotensive and NTG stopped. Levophed started. PCCM admitted the patient.   SIGNIFICANT EVENTS / STUDIES:  5/22 - Admit with Hypertensive Crisis, Cardiac Arrest, Pulmonary Edema, PNA  ETT 5/22 >> 5/27  5/22 CT head: NAD  5/22 CT chest: Bilateral pleural effusions. Peribronchial cuffing and areas of scattered ground-glass opacity, right lower lobe predominant, may reflect infection or edema  5/23 Echo: EF 55-60%  5/28 R thoracentesis. 1300 clear yellow fluid - transudate by chemistries  5/30 Transfer to SDU  As of 05/19/2013 the patient has been transferred to the care of the [facility].   Assessment/Plan:  PEA arrest - status post CPR time 7 minutes  Possibly respiratory driven - appears stable at this time   Acute respiratory failure in setting of cardiac arrest  Resolved - extubated May 27   Pulmonary edema with small bilateral pleural effusions - acute  on chronic diastolic congestive heart failure  -  Monitor respiratory status  Aspiration pneumonia/pneumonitis + pneumococcal pneumonia  U. Strep 5/22>>>POS - has completed a prescribe antibiotic course and is stable clinically   Hypotension, possibly due to diuresis and the addition of several blood pressure medications. -  Decrease losartan 25 mg in place hold parameters for systolic blood pressure less than 100 -  Decrease carvedilol to 6.25 mg twice a day in place hold parameter for SBP less than 95 or heart rate less than 60 -  Discontinue diltiazem and Lasix -  Awaiting IV access for small fluid bolus -  Monitor for signs of sepsis  Malignant hypertension with hypertensive emergency, resolved, now hypotensive  Tachycardia with history of Atrial fibrillation diagnosed 2008  CHADS2-VASc score of 5 - xarelto - cardiology is directing antiarrhythmic therapy, today is day 3 of tikosyn - Telemetry demonstrates a sinus rhythm  Nonischemic cardiomyopathy - ejection fraction 45%  Cardiac cath 04/22/04 showed only 30% stenosis of LAD - cardiology following   Chronic left bundle branch block   Hypothyroidism  TSH 7 - suggest no change in medication treatment during acute illness and recommend recheck TSH in 6-8 weeks  Hodgkin's disease - diagnosed 1988  Status post XRT  History of breast cancer 2009 with recurrence 2013  Status post chemotherapy plus XRT  Raynaud's  L carotid stenosis at 60-79% (02/12/13) Severe protein calorie malnutrition with 8 pound weight loss over the last 1-1/2 weeks and poor oral intake -  Encouraged patient to use her dietary supplements. -  Encouraged her to eat as much  as possible.  Diet:  Regular diet Access:  PIV to be established by phlebotomy,  IVF:  Awaiting IV access to start small fluid bolus Proph:  Xarelto   Code Status: Full code Family Communication: Spoke with patient and her friend Disposition Plan: Pending improvement in her blood  pressure   Consultants:  [facility], Dr. Adolm Joseph  Antibiotics:  Vanco 5/22>>>5/27  Ceftaz 5/22>>>5/27  Unasyn 5/27 >> 5/29  Augmentin 5/29 >> 6/1  HPI/Subjective:  Patient states that she has felt somewhat lightheaded when she stands up or walks around with physical therapy. This afternoon when her blood pressure dropped to 80/38 she became lightheaded. She is feeling somewhat better now. She denies palpitations, chest pain, shortness of breath, nausea, vomiting, diarrhea. She is still continuing to void frequently.  Objective: Filed Vitals:   05/22/13 1015 05/22/13 1016 05/22/13 1500 05/22/13 1510  BP: 124/51 124/51 94/29 80/38   Pulse: 84  70 70  Temp:   99 F (37.2 C)   TempSrc:   Oral   Resp:   18   Height:      Weight:      SpO2:   93%     Intake/Output Summary (Last 24 hours) at 05/22/13 1607 Last data filed at 05/22/13 1500  Gross per 24 hour  Intake    960 ml  Output   2226 ml  Net  -1266 ml   Filed Weights   05/20/13 0500 05/21/13 1648 05/22/13 0500  Weight: 60.8 kg (134 lb 0.6 oz) 59.1 kg (130 lb 4.7 oz) 59.1 kg (130 lb 4.7 oz)    Exam:   General:  Cachectic Caucasian female,  No acute distress  HEENT:  NCAT, MMM  Cardiovascular:  RRR, nl S1, S2 no mrg, 2+ pulses, warm extremities  Respiratory:  Diminished bilateral breath sounds with expiratory wheeze, no rhonchi or rales, no increased WOB  Abdomen:   NABS, soft, NT/ND  MSK:   Normal tone and bulk, 1+ LEE  Neuro:  Grossly intact  Data Reviewed: Basic Metabolic Panel:  Recent Labs Lab 05/19/13 0410 05/20/13 0400 05/20/13 0830 05/21/13 0408 05/22/13 0601  NA 134* 132* 136 135 137  K 4.3 6.0* 4.1 4.7 4.1  CL 93* 95* 95* 95* 100  CO2 30 29 30 30 28   GLUCOSE 114* 116* 106* 131* 109*  BUN 17 19 17 18 18   CREATININE 0.65 0.63 0.64 0.66 0.68  CALCIUM 8.8 8.7 8.9 9.1 9.1  MG  --   --  2.2 2.2 2.2   Liver Function Tests: No results found for this basename: AST, ALT, ALKPHOS,  BILITOT, PROT, ALBUMIN,  in the last 168 hours No results found for this basename: LIPASE, AMYLASE,  in the last 168 hours No results found for this basename: AMMONIA,  in the last 168 hours CBC:  Recent Labs Lab 05/17/13 0835 05/19/13 0410 05/20/13 0400 05/21/13 0408  WBC 14.8* 11.3* 13.1* 12.3*  HGB 12.1 12.0 11.7* 12.4  HCT 36.3 36.0 34.0* 36.1  MCV 94.0 93.0 90.7 92.1  PLT 425* 570* 615* 749*   Cardiac Enzymes:  Recent Labs Lab 05/18/13 1415  CKTOTAL 21  CKMB 1.1   BNP (last 3 results)  Recent Labs  05/15/13 0428 05/16/13 0500 05/19/13 0410  PROBNP 2893.0* 3031.0* 601.4*   CBG:  Recent Labs Lab 05/18/13 1209 05/22/13 1440  GLUCAP 124* 123*    Recent Results (from the past 240 hour(s))  BODY FLUID CULTURE     Status: None   Collection  Time    05/15/13  2:07 PM      Result Value Range Status   Specimen Description PLEURAL RIGHT FLUID   Final   Special Requests FLUID   Final   Gram Stain     Final   Value: RARE WBC PRESENT, PREDOMINANTLY PMN     NO SQUAMOUS EPITHELIAL CELLS SEEN     NO ORGANISMS SEEN   Culture NO GROWTH 3 DAYS   Final   Report Status 05/19/2013 FINAL   Final     Studies: No results found.  Scheduled Meds: . antiseptic oral rinse  15 mL Mouth Rinse BID  . carvedilol  6.25 mg Oral BID WC  . dofetilide  250 mcg Oral Q12H  . feeding supplement  237 mL Oral BID BM  . levothyroxine  125 mcg Oral QAC breakfast  . [START ON 05/23/2013] losartan  25 mg Oral Daily  . polyethylene glycol  17 g Oral Daily  . rivaroxaban  20 mg Oral Q supper  . senna-docusate  1 tablet Oral BID  . sodium chloride  250 mL Intravenous Once   Continuous Infusions:   Active Problems:   Pleural effusion    Time spent: 30 min    Sharilynn Cassity, [facility]  [facility] Pager [phone]. If 7PM-7AM, please contact night-coverage at [email or web address], password [facility] 05/22/2013, 4:07 PM  LOS: 13 days

## 2013-05-22 NOTE — Progress Notes (Signed)
Patient ID: [name], female   DOB: [birth year], 67 y.o.   MRN: [id number]    Subjective:  Feels washed out today Doing ok without oxygen Objective:  Vital Signs in the last 24 hours: Temp:  [98.1 F (36.7 C)-98.8 F (37.1 C)] 98.1 F (36.7 C) (06/02 0750) Pulse Rate:  [78-88] 79 (06/02 0330) Resp:  [18-22] 20 (06/02 0330) BP: (92-129)/(32-57) 129/57 mmHg (06/02 0330) SpO2:  [96 %-98 %] 98 % (06/02 0330) Weight:  [134 lb 0.6 oz (60.8 kg)] 134 lb 0.6 oz (60.8 kg) (06/02 0500)  Intake/Output from previous day: 06/01 0701 - 06/02 0700 In: 1080 [P.O.:1080] Out: 950 [Urine:950]  Physical Exam: General: alert, cooperative, lying in bed in NAD Neck: supple, no lymphadenopathy, JVP not elevated Lungs: Decreased breath sounds at bases bilaterally left greater than right Heart: regular rate and rhythm, no m/g/r Abdomen: soft, non-tender, non-distended, normal bowel sounds Extremities: no edema Neurologic: alert & oriented X3, cranial nerves II-XII intact, strength grossly intact, sensation intact to light touch  Lab Results:  Recent Labs  05/19/13 0410 05/20/13 0400  WBC 11.3* 13.1*  HGB 12.0 11.7*  PLT 570* 615*    Recent Labs  05/19/13 0410 05/20/13 0400  NA 134* 132*  K 4.3 6.0*  CL 93* 95*  CO2 30 29  GLUCOSE 114* 116*  BUN 17 19  CREATININE 0.65 0.63   No results found for this basename: TROPONINI, CK, MB,  in the last 72 hours  Cardiac Studies: Echocardiogram 05/10/13 - Left ventricle: The cavity size was normal. Wall thickness was normal. Systolic function was normal. The estimated ejection fraction was in the range of 55% to 60%. Regional wall motion abnormalities cannot be excluded. Doppler parameters are consistent with abnormal left ventricular relaxation (grade 1 diastolic dysfunction). Doppler parameters are consistent with high ventricular filling pressure. - Pulmonary arteries: Systolic pressure was mildly increased. PA peak pressure: 36mm  Hg (S). - Pericardium, extracardiac: A small pericardial effusion was identified.  Tele: NSR currently, had afib/RVR yesterday  Assessment/Plan:  The patient is a 67 yo woman, history of HTN, diastolic CHF, LBBB, paroxysmal afib presented with dyspnea and hypoxia, found to be hypertensive with pulmonary edema.   # Acute Respiratory Failure - The patient presented with dyspnea and acute respiratory failure, found to have diffuse pulmonary edema as well as a right-sided infiltrate concerning for aspiration pneumonia (witnessed vomiting by EMS).  Patient extubated 5/27, currently on O2 by [place].  There additionally appears to have been a component of acute/chronic diastolic CHF but she has diuresed reasonably well and now appears euvolemic.  -  Lasix PO  - Augmentin for aspiration PNA .  # Atrial fibrillation - the patient has a history of paroxysmal afib with CHADS2-Vasc score of 5. Patient on amiodarone 5/23-5/25, then restarted home flecainide 5/25.  The dose of flecainide was increased to 100 mg BID 5/29.  Flecainide was stopped on 5/30.  On tikosyn  Dose decreased after QT went to 520 after first dose.  QT this am 473 NSR  Ok to discharge in am from our standpoint.  On xarelto for anticoagulation # Hypertension - Stable BP now.  # EKG abnormalities - the patient was initially called as a code STEMI, but further review of EKG showed only stable changes and old LBBB.  The patient notes no chest pain.  The patient had an exercise stress test 11/2012, which was stopped early due to fatigue, but showed no acute abnormalities. Cardiac cath 04/22/04 showed  30% stenosis of LAD at that time.  # Hypothyroidism - the patient has a history of hypothyroidism -continue synthroid  # Hypokalemia - Repleted # Hyponatremia - Resolved # History of breast cancer - s/p chemo, radiation  Hodgkins lymphoma:  S/p XRT and chemo # Carotid Stenosis - 60-79% stenosis of L ICA seen on dopplers 02/12/13  Charlton Haws 2:01  PM

## 2013-05-23 DIAGNOSIS — I469 Cardiac arrest, cause unspecified: Secondary | ICD-10-CM

## 2013-05-23 DIAGNOSIS — I169 Hypertensive crisis, unspecified: Secondary | ICD-10-CM

## 2013-05-23 DIAGNOSIS — J13 Pneumonia due to Streptococcus pneumoniae: Secondary | ICD-10-CM

## 2013-05-23 LAB — BASIC METABOLIC PANEL
CO2: 25 mEq/L (ref 19–32)
Calcium: 9 mg/dL (ref 8.4–10.5)
GFR calc Af Amer: >90 mL/min (ref >90)
GFR calc non Af Amer: 86 mL/min — ABNORMAL LOW (ref >90)
Sodium: 135 mEq/L (ref 135–145)

## 2013-05-23 LAB — MAGNESIUM: Magnesium: 2.2 mg/dL (ref 1.5–2.5)

## 2013-05-23 MED ORDER — FUROSEMIDE 20 MG PO TABS: 20.0000 mg | ORAL_TABLET | Freq: Every day | ORAL | Status: DC

## 2013-05-23 MED ORDER — DOFETILIDE 250 MCG PO CAPS: 250.0000 ug | ORAL_CAPSULE | Freq: Two times a day (BID) | ORAL | Status: DC

## 2013-05-23 MED ORDER — ENSURE COMPLETE PO LIQD: 237.0000 mL | Freq: Two times a day (BID) | ORAL | Status: DC

## 2013-05-23 MED ORDER — CARVEDILOL 6.25 MG PO TABS: 6.2500 mg | ORAL_TABLET | Freq: Two times a day (BID) | ORAL | Status: DC

## 2013-05-23 MED ORDER — RIVAROXABAN 20 MG PO TABS: 20.0000 mg | ORAL_TABLET | Freq: Every day | ORAL | Status: DC

## 2013-05-23 NOTE — Progress Notes (Signed)
05/23/13 1130 In to speak with pt. about Tikosyn and Xarelto medications.  This NCM called [facility] [phone]) to inquire of availability of Tikosyn and Xarelto.  They stated they did not have medication, however the [address] [facility] has Tiks[facility]yn as well as Xarelto in stock.  This NCM gave pt. a 10day Free card for Xarelto.  Pt. will need 10day RX for Xarelto.  Spoke with Dr. Malachi Bonds, and she will RX for 10day Xarelto.  Pt. to dc home with spouse today. Tera Mater, RN, BSN NCM [phone]

## 2013-05-23 NOTE — Progress Notes (Signed)
1325 discharge instructions given to pt and husband . BNoth verbalized understanding. Wheeled to lobby by Agricultural consultant

## 2013-05-23 NOTE — Progress Notes (Signed)
Agree with PT treatment note.  Aariyah Sampey, PT DPT [phone]  

## 2013-05-23 NOTE — Progress Notes (Signed)
Patient ID: [name], female   DOB: 07-15-1946, 67 y.o.   MRN: [id number] Feeling well. For discharge today. I will arrange follow up with [place] next week.

## 2013-05-23 NOTE — Progress Notes (Signed)
Physical Therapy Treatment Patient Details Name: [name] MRN: [id number] DOB: 1946-12-13 Today's Date: 05/23/2013 Time: 7829-5621 PT Time Calculation (min): 19 min  PT Assessment / Plan / Recommendation Comments on Treatment Session  Pt much improved today. Able to ambulate with RW and supervision with SpO2<92% on RA. Pt's SpO2 did decrease with stair use but pt does not need to get into her home by steps, has a ramp at another entrance. Pt continues to be deconditioned and prefers to have the RW at home as help is not available 24/7. Acute PT will continue to work with pt to improve activity tolerance and SpO2 stability. Expect pt safe to d/c home with intermittent supervision for OOB mobility and HHPT to improve activity tolerance and SpO2 with higher level activity such as steps.     Follow Up Recommendations  Home health PT;Supervision - Intermittent;Supervision for mobility/OOB     Does the patient have the potential to tolerate intense rehabilitation     Barriers to Discharge        Equipment Recommendations  Rolling walker with 5" wheels    Recommendations for Other Services    Frequency Min 3X/week   Plan Discharge plan remains appropriate    Precautions / Restrictions Precautions Precautions: Fall Restrictions Weight Bearing Restrictions: No   Pertinent Vitals/Pain 0/10    Mobility  Bed Mobility Bed Mobility: Supine to Sit;Sit to Supine Supine to Sit: 7: Independent Sitting - Scoot to Edge of Bed: 7: Independent Sit to Supine: 7: Independent Transfers Transfers: Sit to Stand;Stand to Sit Sit to Stand: From bed;6: Modified independent (Device/Increase time) Stand to Sit: To bed;6: Modified independent (Device/Increase time) Details for Transfer Assistance: Pt with hand use and increased time sit to stand. Pt with mild instability in standing- minG Ambulation/Gait Ambulation/Gait Assistance: 5: Supervision Ambulation Distance (Feet): 150 Feet Assistive  device: Rolling walker (pt requested RW for stability) Ambulation/Gait Assistance Details: Pt with good RW use with supervision, v/c x1 to remain close to walker Gait Pattern: Step-through pattern Gait velocity: improved from last week General Gait Details: SpO2:92% on RA Stairs: Yes Stairs Assistance: 4: Min guard Stairs Assistance Details (indicate cue type and reason): Pt with minG for first time stair use, SpO2: 78% on RA, quickly returning to 95%. Suggested pt work on stair tolerance with HHPT and have a chair close. Until she is stronger, recommend ramp use only Stair Management Technique: One rail Left;Step to pattern Number of Stairs: 3    Exercises     PT Diagnosis:    PT Problem List:   PT Treatment Interventions:     PT Goals Acute Rehab PT Goals PT Goal Formulation: With patient Time For Goal Achievement: 05/28/13 Potential to Achieve Goals: Good PT Goal: Supine/Side to Sit - Progress: Met PT Goal: Sit to Stand - Progress: Met PT Transfer Goal: Bed to Chair/Chair to Bed - Progress: Met PT Goal: Stand - Progress: Met PT Goal: Ambulate - Progress: Met PT Goal: Up/Down Stairs - Progress: Progressing toward goal (pt with SpO2: 78%) PT Goal: Perform Home Exercise Program - Progress: Progressing toward goal  Visit Information  Last PT Received On: 05/23/13 Assistance Needed: +1    Subjective Data  Subjective: Pt recieved supine in bed, SpO2 95% on RA, agreeable to PT Patient Stated Goal: to go home today   Cognition  Cognition Arousal/Alertness: Awake/alert Behavior During Therapy: WFL for tasks assessed/performed Overall Cognitive Status: Within Functional Limits for tasks assessed    Balance  End of Session PT - End of Session Equipment Utilized During Treatment: Gait belt Activity Tolerance: Patient tolerated treatment well Patient left: in bed;with call bell/phone within reach Nurse Communication:  (pt requests a bath)   GP    05/23/2013, 11:25 AM Marvis Moeller, Student Physical Therapist Office #: [phone]

## 2013-05-23 NOTE — Discharge Summary (Addendum)
Physician Discharge Summary  [name] NWG:[id number] DOB: [birth year] DOA: 05/09/2013  PCP: Thora Lance, MD  Admit date: 05/09/2013 Discharge date: 05/23/2013  Recommendations for Outpatient Follow-up:  Followup with cardiology in one week.  The patient will be called by the cardiology clinic. TSH to be rechecked in 4-6 weeks.  Repeat CBC in 1 week to check leukocytosis and thrombocytosis.   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 Patient called EMS herself and on arrival sats were 40%, pt cyanotic with agonal respirations. EMS did BVM with multiple episodes of vomiting in route. On arrival to ER sats were in 70's. Intubated on arrival. Was hypertensive and placed on NTG gtt and given lasix, versed / fentanyl. Became hypotensive with desaturations and arrested in ER with PEA arrest requiring 7 minutes of in house CPR with ROSC (given epi, calcium, bicarb x2). Pt then became hypertensive again after arrest (220/9=80's) and NTG continued. On arrival of Critical Care team to ER, patient became hypotensive and NTG stopped. Levophed started. PCCM admitted the patient.   SIGNIFICANT EVENTS / STUDIES:  5/22 - Admit with  Hypertensive Crisis, Cardiac Arrest, Pulmonary Edema, PNA  ETT 5/22 >> 5/27  5/22 CT head: NAD  5/22 CT chest: Bilateral pleural effusions. Peribronchial cuffing and areas of scattered ground-glass opacity, right lower lobe predominant, may reflect infection or edema  5/23 Echo: EF 55-60%  5/28 R thoracentesis. 1300 clear yellow fluid - transudate by chemistries  5/30 Transfer to SDU  As of 05/19/2013 the patient transferred to the care of the [facility].   Hospital Course:   PEA arrest - status post CPR time 7 minutes, possibly respiratory driven.  Has been stable for several days. Acute respiratory failure in setting of cardiac arrest.  Was probably due to aspiration pneumonia and pneumococcal pneumonia. Resolved - extubated May 27  Pulmonary edema with small bilateral pleural effusions - acute on chronic diastolic congestive heart failure, resolving after diuresis with Lasix. Aspiration pneumonia/pneumonitis + pneumococcal pneumonia.  U. Strep 5/22>>>POS - she completed a full course of antibiotics and has been stable clinically.  She developed hypotension on June 4, possibly due to diuresis and the addition of several blood pressure medications for her hypertensive urgency.  Her losartan dose was decreased to 25 mg daily, her carvedilol was decreased to 6.5 mg twice daily. Her diltiazem and Lasix were held.  Her blood pressure improved after these interventions.  Malignant hypertension with hypertensive emergency, resolved with addition of several blood pressure medications.  Her elevated blood pressures may have been due to her acute illness and administration of medication such as epinephrine for her cardiac arrest.  Tachycardia with history of Atrial  fibrillation diagnosed 2008.  CHADS2-VASc score of 5.  She will be anticoagulated with Xarelto.  She was transitioned to [place]. She had intermittently prolonged QTC at greater than 500 which required dose reduction of her tikosyn.  She will have close followup with cardiology.  She has remained in sinus rhythm on telemetry.  Nonischemic cardiomyopathy - ejection fraction 45%, possibly due to cardiac arrest and atrial fibrillation with tachycardia.  Cardiac cath 04/22/04 showed only 30% stenosis of LAD.    Chronic left bundle branch block   Hypothyroidism TSH 7 - suggest no change in medication treatment during acute illness and recommend recheck TSH in 6-8 weeks.  Hodgkin's disease - diagnosed 67  Status post XRT  History of breast cancer 2009 with recurrence 2013  Status post chemotherapy plus XRT  Raynaud's   L carotid stenosis at 60-79% (02/12/13)   Severe protein calorie malnutrition with 8 pound weight loss over the last 1-1/2 weeks and poor oral intake  - Encouraged patient to use her dietary supplements.  - Encouraged her to eat as much as possible.   Consultants:  [facility], Dr. Daleen Squibb Antibiotics:  Vanco 5/22>>>5/27  Ceftaz 5/22>>>5/27  Unasyn 5/27 >> 5/29  Augmentin 5/29 >> 6/1   Discharge Exam: Filed Vitals:   05/23/13 0500  BP: 115/61  Pulse: 84  Temp: 99 F (37.2 C)  Resp: 18   Filed Vitals:   05/22/13 1510 05/22/13 1802 05/22/13 2129 05/23/13 0500  BP: 80/38 130/59 105/65 115/61  Pulse: 70 83 78 84  Temp:   98.6 F (37 C) 99 F (37.2 C)  TempSrc:   Oral Oral  Resp:   18 18  Height:      Weight:    58.7 kg (129 lb 6.6 oz)  SpO2:   94% 93%   Feeling well.  Slept well overnight and chest pain has improved.  Cough with deep inspiration  General: Cachectic Caucasian female, No acute distress  HEENT: NCAT, MMM  Cardiovascular: RRR, nl S1, S2 no mrg, 2+ pulses, warm extremities  Respiratory:  Diminished bilateral breath sounds at the bases, no rhonchi, wheeze, or rales, no increased WOB  Abdomen: NABS, soft, NT/ND  MSK: Normal tone and bulk,  Trace LEE  Neuro: Grossly intact   Discharge Instructions      Discharge Orders   Future Appointments Provider Department  Dept Phone   08/15/2013 2:30 PM Wendall Stade, MD [facility] Main Office [place]) [phone]   03/06/2014 10:00 AM Krista Blue [facility] [phone]   03/13/2014 10:00 AM Victorino December, MD [facility] [phone]   Future Orders Complete By Expires     (HEART FAILURE PATIENTS) Call MD:  Anytime you have any of the following symptoms: 1) 3 pound weight gain in 24 hours or 5 pounds in 1 week 2) shortness of breath, with or without a dry hacking cough 3) swelling in the hands, feet or stomach 4) if you have to sleep on extra pillows at night in order to breathe.  As directed     Call MD for:  difficulty breathing, headache or visual disturbances  As directed     Call MD for:  extreme fatigue  As directed     Call MD for:  hives  As directed     Call MD for:  persistant dizziness or light-headedness  As directed     Call MD for:  persistant nausea and vomiting  As directed  Call MD for:  severe uncontrolled pain  As directed     Call MD for:  temperature >100.4  As directed     Diet general  As directed     Discharge instructions  As directed     Comments:      You were hospitalized with pneumonia, high blood pressure and had cardiac arrest. You were in the ICU for several days and required the ventilator.  You improved after antibiotics, blood pressure medications, Lasix. You were in atrial fibrillation with a fast heart rate and cardiology started you on tikosyn to prevent you from going into atrial fibrillation.  You should take xarelto to reduce your risk of strokes in the setting of atrial fibrillation.  Please follow up with cardiology within one week of discharge.  Please take a low dose of lasix until you follow up.  Please discontinue your lasix if you develop lightheadedness with sitting up or standing, become dehydrated.  If you gain more than 3 pounds in one day or 5 pounds in one week, please call your  cardiology office.  Please drink Ensure or an equivalent supplements to regain your strength.  If you start to eat and drink well, please resume a 2 g sodium diet.    Increase activity slowly  As directed         Medication List    STOP taking these medications       flecainide 100 MG tablet  Commonly known as:  TAMBOCOR      TAKE these medications       calcium carbonate 600 MG Tabs  Commonly known as:  OS-CAL  Take 600 mg by mouth 2 (two) times daily with a meal.     carvedilol 6.25 MG tablet  Commonly known as:  COREG  Take 1 tablet (6.25 mg total) by mouth 2 (two) times daily with a meal.     Cholecalciferol 5000 UNITS Chew  Chew 5,000 Units by mouth daily.     dofetilide 250 MCG capsule  Commonly known as:  TIKOSYN  Take 1 capsule (250 mcg total) by mouth every 12 (twelve) hours.     feeding supplement Liqd  Take 237 mLs by mouth 2 (two) times daily between meals.     furosemide 20 MG tablet  Commonly known as:  LASIX  Take 1 tablet (20 mg total) by mouth daily.     levothyroxine 125 MCG tablet  Commonly known as:  SYNTHROID, LEVOTHROID  Take 125 mcg by mouth daily before breakfast.     losartan 25 MG tablet  Commonly known as:  COZAAR  Take 1 tablet (25 mg total) by mouth daily.     MAG-OX 400 400 MG tablet  Generic drug:  magnesium oxide  Take 400 mg by mouth 2 (two) times daily.     multivitamin tablet  Take 1 tablet by mouth daily. Natural multivitamin     OVER THE COUNTER MEDICATION  Take 1 tablet by mouth 2 (two) times daily. osteodenx supplement     OVER THE COUNTER MEDICATION  Take 3 tablets by mouth daily at 12 noon. Vegetarian omega green supplement     Rivaroxaban 20 MG Tabs  Commonly known as:  XARELTO  Take 1 tablet (20 mg total) by mouth daily with supper.       Follow-up Information   Follow up with [facility]. (Home health physical therapy)    Contact information:   [phone]      Follow  up with Valera Castle, MD.  Schedule an appointment as soon as possible for a visit in 1 week.   Contact information:   [address] [place] [address] [phone]       Follow up with Thora Lance, MD. Schedule an appointment as soon as possible for a visit in 1 month.   Contact information:   [address] [place] [address] [phone]       The results of significant diagnostics from this hospitalization (including imaging, microbiology, ancillary and laboratory) are listed below for reference.    Significant Diagnostic Studies: Dg Chest 2 View  05/17/2013   *RADIOLOGY REPORT*  Clinical Data: Zane Samson of breath.  Chest pressure.  CHEST - 2 VIEW  Comparison: 05/16/2013  Findings: Right jugular venous catheter removed.  Bilateral extensive airspace opacities have improved.  Left pleural effusion improved.  No pneumothorax.  IMPRESSION: Improved airspace disease and left pleural effusion.   Original Report Authenticated By: Jolaine Click, M.D.   Ct Head Wo Contrast  05/09/2013   *RADIOLOGY REPORT*  Clinical Data: Hypertensive crisis, on vent  CT HEAD WITHOUT CONTRAST  Technique:  Contiguous axial images were obtained from the base of the skull through the vertex without contrast.  Comparison: None.  Findings: No evidence of parenchymal hemorrhage or extra-axial fluid collection. No mass lesion, mass effect, or midline shift.  No CT evidence of acute infarction.  Subcortical white matter and periventricular small vessel ischemic changes.  Focal calcification in the subcortical left frontal lobe (series 2/image 20).  Cerebral volume is age appropriate.  No ventriculomegaly.  Partial opacification of the bilateral sphenoid sinuses.  The mastoid air cells are clear.  No evidence of calvarial fracture.  IMPRESSION: No evidence of acute intracranial abnormality.  Mild small vessel ischemic changes.   Original Report Authenticated By: Charline Bills, M.D.   Dg Chest Port 1 View  05/20/2013    *RADIOLOGY REPORT*  Clinical Data: Follow-up pulmonary edema  PORTABLE CHEST - 1 VIEW  Comparison: Chest radiograph 05/17/2013  Findings: Normal cardiac silhouette.  Lungs are hyperinflated. Small bilateral pleural effusions present.  Left basilar atelectasis again demonstrated.  No pneumothorax.  IMPRESSION:  1.  No interval change. 2.  Hyperinflated lungs. 3.  Left basilar atelectasis and bilateral effusions.   Original Report Authenticated By: Genevive Bi, M.D.   Dg Chest Port 1 View  05/16/2013   *RADIOLOGY REPORT*  Clinical Data: Respiratory failure, follow-up  PORTABLE CHEST - 1 VIEW  Comparison: Portable chest x-ray of 05/15/2013  Findings:  The lungs are poorly aerated with no airspace disease in the right upper lung field, as well as airspace disease remaining in both lower lobes left greater than right.  The right central venous line tip is in the region of the right atrium and cardiomegaly is stable.  IMPRESSION: Worsening of airspace disease the tip in the right upper lobe.   Original Report Authenticated By: Dwyane Dee, M.D.   Dg Chest Port 1 View  05/15/2013   *RADIOLOGY REPORT*  Clinical Data: Status post right thoracentesis  PORTABLE CHEST - 1 VIEW  Comparison: Prior chest x-ray earlier today 05/15/2013 at 05:16 a.m.  Findings: Stable position of right IJ approach central venous catheter.  The catheter tip projects over the upper right atrium in unchanged position.  There is been a significant interval decrease in size of the right pleural effusion.  No evidence of pneumothorax.  Persistent layering left pleural effusion and associated left basilar opacity.  Cardiac and mediastinal contours are unchanged.  There is aortic atherosclerosis in the transverse aorta.  Surgical clips project over the soft tissues of the left neck.  IMPRESSION:  1.  Marked interval decrease in size of the right pleural effusion without evidence of complicating pneumothorax. 2.  Persistent left pleural effusion  associated left basilar atelectasis versus infiltrate. 3.  Unchanged position of right IJ central venous catheter with the tip in the upper right atrium.   Original Report Authenticated By: Malachy Moan, M.D.   Dg Chest Port 1 View  05/15/2013   *RADIOLOGY REPORT*  Clinical Data: Follow up respiratory failure  PORTABLE CHEST - 1 VIEW  Comparison: 05/14/2013  Findings:  There is a right IJ catheter with tip in the right atrium.  Heart size is stable.  There are bilateral pleural effusions and interstitial edema.  Bilateral multifocal airspace opacities are unchanged from previous study.  IMPRESSION:  1.  CHF. 2.  No change in bilateral airspace opacities.   Original Report Authenticated By: Signa Kell, M.D.   Dg Chest Port 1 View  05/14/2013   *RADIOLOGY REPORT*  Clinical Data: Acute respiratory distress  PORTABLE CHEST - 1 VIEW  Comparison: 05/13/2013  Findings: Cardiac shadow is stable.  A right-sided central venous line is again identified with tip in right atrium.  Endotracheal tube has been removed in the interval.  Persistent bilateral airspace disease is noted worse on the right than the left.  No new focal abnormality is seen.  IMPRESSION: The overall appearance is stable from prior exam.  The endotracheal tube has been removed in the interval.   Original Report Authenticated By: Alcide Clever, M.D.   Dg Chest Port 1 View  05/13/2013   *RADIOLOGY REPORT*  Clinical Data: Pneumonia.  PORTABLE CHEST - 1 VIEW  Comparison: 05/12/2013.  Findings: The endotracheal tube is just above the carina and could be retracted to 3 cm.  The right IJ catheter is stable.  The NG tube is stable.  Persistent asymmetric bilateral airspace process, right much greater than left likely reflecting multifocal pneumonia.  Bilateral effusions are also noted.  IMPRESSION:  1.  The endotracheal tube is just above the carina and could be retracted 2-3 cm. 2.  Remaining support apparatus is stable. 3.  Persistent bilateral  asymmetric airspace process, likely pneumonia.   Original Report Authenticated By: Rudie Meyer, M.D.   Dg Chest Port 1 View  05/12/2013   *RADIOLOGY REPORT*  Clinical Data: Pneumonia, history of Hodgkin's disease and breast cancer  PORTABLE CHEST - 1 VIEW  Comparison: 05/11/2013; 05/10/2013; 04/12/2012  Findings: Grossly unchanged cardiac silhouette and mediastinal contours with persistent obscuration of the right heart border secondary to extensive consolidative airspace opacities within the right mid and lower lung which appear to have worsened in the interval. Grossly unchanged left basilar heterogeneous opacities. Likely unchanged small bilateral effusions.  Unchanged left apical pleuroparenchymal thickening.  No definite pneumothorax.  Unchanged bones.  IMPRESSION:  1.  Stable positioning of support apparatus.  No pneumothorax. 2.  Interval worsening of right mid and lower lung heterogeneous air space opacities worrisome for progression of infection.  3. Grossly unchanged left basilar heterogeneous opacities, atelectasis versus infiltrate. 4.  Unchanged small bilateral effusions.   Original Report Authenticated By: Tacey Ruiz, MD   Dg Chest Port 1 View  05/11/2013   *RADIOLOGY REPORT*  Clinical Data: Ventilator dependent respiratory failure.  Follow up pneumonia.  PORTABLE CHEST - 1 VIEW 05/11/2013 0452 hours:  Comparison: Portable chest  x-rays yesterday and 05/09/2013.  Findings: Endotracheal tube tip projects approximately 3 cm above the carina, unchanged.  Nasogastric tube looped in the stomach with its tip in the fundus.  Right jugular central venous catheter tip projects over the lower SVC at the cavoatrial junction, unchanged. Dense airspace consolidation with air bronchograms in the central right upper lobe and in the right lower lobe, with slight improvement in aeration since yesterday.  Stable moderate sized right pleural effusion and small left pleural effusion.  Minimal residual airspace  opacities at the left lung base.  Cardiac silhouette normal in size, unchanged.  Pulmonary vascularity normal.  IMPRESSION: Support apparatus satisfactory.  Minimal improvement in the still dense pneumonia involving the central right upper lobe and the right lower lobe.  Stable bilateral pleural effusions, right greater than left.  No new abnormalities.   Original Report Authenticated By: Hulan Saas, M.D.   Dg Chest Port 1 View  05/10/2013   *RADIOLOGY REPORT*  Clinical Data: Evaluate endotracheal tube position and airspace disease.  PORTABLE CHEST - 1 VIEW  Comparison: 05/09/2013.  Findings: The support apparatus is stable.  The heart is normal in size and unchanged.  Persistent diffuse right lung airspace process with interval improved right upper lobe lung aeration.  The left lung also demonstrates improved aeration.  IMPRESSION:  1.  Stable support apparatus. 2.  Improved right upper lobe and left lung aeration. 3.  Persistent dense airspace consolidation in the right lower lobe.   Original Report Authenticated By: Rudie Meyer, M.D.   Dg Chest Port 1 View  05/09/2013   *RADIOLOGY REPORT*  Clinical Data: Evaluate ETT, airspace disease  PORTABLE CHEST - 1 VIEW  Comparison: 05/09/2013 at 1639 hours  Findings: Endotracheal tube terminates 3 cm above the carina.  Interval placement of a right IJ venous catheter with its tip at the cavoatrial junction.  No pneumothorax.  Interval placement of an enteric tube coursing into the stomach.  Asymmetric pulmonary opacities, right mid lung predominant, unchanged.  Biapical pleural thickening/fluid.  Heart is normal in size.  Defibrillator pads overlying the left hemithorax.  IMPRESSION: Endotracheal tube terminates 3 cm above the carina.  Interval placement of a right IJ venous catheter with its tip at the cavoatrial junction.  No pneumothorax.  Stable asymmetric pulmonary opacities with biapical pleural thickening/fluid.   Original Report Authenticated By:  Charline Bills, M.D.   Dg Chest Portable 1 View  05/09/2013   *RADIOLOGY REPORT*  Clinical Data: Shortness of breath, intubation, history breast cancer, Hodgkin's disease  PORTABLE CHEST - 1 VIEW  Comparison: Portable exam 1639 hours compared to 05/10/2012 Lung apices were excluded but examination not repeated at ED physician request.  Findings: Tip of endotracheal tube 2.2 cm above carina. External pacing leads present. Normal heart size. Asymmetric airspace infiltrates right greater than left new since previous exam. No gross pleural effusion or pneumothorax.  IMPRESSION: Asymmetric bilateral airspace infiltrates right greater than left, could represent edema, pneumonia, aspiration or alveolar hemorrhage. Satisfactory endotracheal tube position.   Original Report Authenticated By: Ulyses Southward, M.D.   Dg Abd Portable 1v  05/09/2013   *RADIOLOGY REPORT*  Clinical Data: Ileus.  Breast carcinoma.  PORTABLE ABDOMEN - 1 VIEW  Comparison: None.  Findings: No evidence of dilated bowel loops.  Moderate stool noted right colon.  No radiopaque calculi identified. Asymmetric airspace disease noted in the visualized portion of the right lower lung.  IMPRESSION:  1.  Normal bowel gas pattern. 2.  Asymmetric right lung airspace disease.  Original Report Authenticated By: Myles Rosenthal, M.D.    Microbiology: Recent Results (from the past 240 hour(s))  BODY FLUID CULTURE     Status: None   Collection Time    05/15/13  2:07 PM      Result Value Range Status   Specimen Description PLEURAL RIGHT FLUID   Final   Special Requests FLUID   Final   Gram Stain     Final   Value: RARE WBC PRESENT, PREDOMINANTLY PMN     NO SQUAMOUS EPITHELIAL CELLS SEEN     NO ORGANISMS SEEN   Culture NO GROWTH 3 DAYS   Final   Report Status 05/19/2013 FINAL   Final     Labs: Basic Metabolic Panel:  Recent Labs Lab 05/20/13 0400 05/20/13 0830 05/21/13 0408 05/22/13 0601 05/23/13 0550  NA 132* 136 135 137 135  K 6.0* 4.1  4.7 4.1 4.0  CL 95* 95* 95* 100 99  CO2 29 30 30 28 25   GLUCOSE 116* 106* 131* 109* 109*  BUN 19 17 18 18 20   CREATININE 0.63 0.64 0.66 0.68 0.75  CALCIUM 8.7 8.9 9.1 9.1 9.0  MG  --  2.2 2.2 2.2 2.2   Liver Function Tests: No results found for this basename: AST, ALT, ALKPHOS, BILITOT, PROT, ALBUMIN,  in the last 168 hours No results found for this basename: LIPASE, AMYLASE,  in the last 168 hours No results found for this basename: AMMONIA,  in the last 168 hours CBC:  Recent Labs Lab 05/17/13 0835 05/19/13 0410 05/20/13 0400 05/21/13 0408  WBC 14.8* 11.3* 13.1* 12.3*  HGB 12.1 12.0 11.7* 12.4  HCT 36.3 36.0 34.0* 36.1  MCV 94.0 93.0 90.7 92.1  PLT 425* 570* 615* 749*   Cardiac Enzymes:  Recent Labs Lab 05/18/13 1415  CKTOTAL 21  CKMB 1.1   BNP: BNP (last 3 results)  Recent Labs  05/15/13 0428 05/16/13 0500 05/19/13 0410  PROBNP 2893.0* 3031.0* 601.4*   CBG:  Recent Labs Lab 05/18/13 1209 05/22/13 1440  GLUCAP 124* 123*    Time coordinating discharge: 45 minutes  Signed:  Yennifer Segovia  [facility] 05/23/2013, 9:01 AM

## 2013-05-28 ENCOUNTER — Ambulatory Visit: Payer: Medicare Other | Admitting: Pharmacist

## 2013-05-29 ENCOUNTER — Ambulatory Visit (INDEPENDENT_AMBULATORY_CARE_PROVIDER_SITE_OTHER): Payer: Medicare Other | Admitting: Pharmacist

## 2013-05-29 ENCOUNTER — Telehealth: Payer: Self-pay | Admitting: Cardiovascular Disease

## 2013-05-29 VITALS — BP 118/72 | Wt 131.5 lb

## 2013-05-29 DIAGNOSIS — I4891 Unspecified atrial fibrillation: Secondary | ICD-10-CM | POA: Diagnosis not present

## 2013-05-29 DIAGNOSIS — Z8674 Personal history of sudden cardiac arrest: Secondary | ICD-10-CM | POA: Diagnosis not present

## 2013-05-29 DIAGNOSIS — M159 Polyosteoarthritis, unspecified: Secondary | ICD-10-CM | POA: Diagnosis not present

## 2013-05-29 DIAGNOSIS — IMO0001 Reserved for inherently not codable concepts without codable children: Secondary | ICD-10-CM | POA: Diagnosis not present

## 2013-05-29 DIAGNOSIS — I10 Essential (primary) hypertension: Secondary | ICD-10-CM | POA: Diagnosis not present

## 2013-05-29 LAB — BASIC METABOLIC PANEL
Calcium: 9.3 mg/dL (ref 8.4–10.5)
Creatinine, Ser: 0.7 mg/dL (ref 0.4–1.2)
GFR: 85.71 mL/min (ref >60.00)
Glucose, Bld: 78 mg/dL (ref 70–99)
Sodium: 138 mEq/L (ref 135–145)

## 2013-05-29 NOTE — Telephone Encounter (Signed)
New problem    Has concerns about pts b/p being low & medication questions

## 2013-05-29 NOTE — Telephone Encounter (Signed)
Spoke with [name] PT, pt hypotensive sit 100/60, stand 70/50, walking 102/64 a bit lightheaded. Denies SOB just fatigue and weakness from HTN emergency and PEA arrest/ see notes. 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 Pt is staying hydrated.  Pt took meds differently today and thought this may be the reason her pressure was low. Pt Took lasix later in the day /1 hour prior to the low bp. Usual BP 112-116// 60-70, hr usually 80's Physical Therapist will be there tomorrow@ 10:30 am and will check pressures again. She will call and ask for Patricia Bateman LPN/ Dr Fabio Bering nurse to update to see if pressures are better or need adjusting.  Pt was told to check bp in am prior to taking meds, if bp low she was told to hold losartan till she speaks with nurse for further instructions or call at that time.   I will forward note to Nurse to update her to receive call / and Tereso Newcomer PA-C as she has an app 06/07/13.

## 2013-05-30 DIAGNOSIS — M159 Polyosteoarthritis, unspecified: Secondary | ICD-10-CM | POA: Diagnosis not present

## 2013-05-30 DIAGNOSIS — Z8674 Personal history of sudden cardiac arrest: Secondary | ICD-10-CM | POA: Diagnosis not present

## 2013-05-30 DIAGNOSIS — IMO0001 Reserved for inherently not codable concepts without codable children: Secondary | ICD-10-CM | POA: Diagnosis not present

## 2013-05-30 DIAGNOSIS — I10 Essential (primary) hypertension: Secondary | ICD-10-CM | POA: Diagnosis not present

## 2013-05-30 DIAGNOSIS — I4891 Unspecified atrial fibrillation: Secondary | ICD-10-CM | POA: Diagnosis not present

## 2013-05-30 NOTE — Telephone Encounter (Signed)
Decrease carvedilol 6.25 mg => 3.125 mg twice a day. Continue taking Losartan. Have HHRN call us again tomorrow with blood pressures. Tereso Newcomer, PA-C   05/30/2013 4:58 PM

## 2013-05-30 NOTE — Telephone Encounter (Signed)
PT AWARE OF MED CHANGES .Zack Seal

## 2013-05-30 NOTE — Telephone Encounter (Signed)
SPOKE WITH PT  DID NOT TAKE LOSARTAN THIS AM  AND B/P WAS 126/68 HEART RATE  85  PT FEELS GOOD  WAS RECENTLY STARTED  ON LASIX  FOR 30 DAYS

## 2013-05-30 NOTE — Telephone Encounter (Signed)
F/u    Returning your call about pt f/u

## 2013-05-30 NOTE — Telephone Encounter (Signed)
LMTCB ./CY 

## 2013-05-31 NOTE — Progress Notes (Signed)
HPI  [name] is seen today for Tikosyn follow up.  She was admitted to the hospital on 05/09/13 with hypertensive crisis, pneumonia and had PEA arrest in ER.  She has a history of atrial fibrillation, which was diagnosed in 2008.  Prior to admission, she was taking flecainide.  During hospitalization, she was on amiodarone from 5/23-5/25 then changed back to flecainide on 5/25 with a dose increase on 5/29 due to recurrent afib.  On 5/30, flecainide was stopped with plans to transition to Tikosyn once there was an appropriate was out of Tikosyn.  Tikosyn was started on 6/2. Her baseline QTc was 483 msec.  After 1 dose of , her QTc increased to 534 msec so the dose was decreased to .  She remained in NSR and her QTc on discharge was 502 msec (05/23/13).    Since hospital discharge, pt does not report any problems.  She does not think she has any additional episodes of atrial fibrillation.  Denies CP and palpitiations.  She is on appropriate anticoagulation.  She is on magnesium supplement but no potassium supplement.  Her labs were stable at discharge. She did report having to check with 3 pharmacies before finding one that had the medication.  She was able to get it for $95/month, which she states is affordable.   Repeat EKG today (reviewed by Dr. Johney Frame): NSR, 78 bpm, QTc- 508 msec  Current Outpatient Prescriptions  Medication Sig Dispense Refill  . calcium carbonate (OS-CAL) 600 MG TABS Take 600 mg by mouth 2 (two) times daily with a meal.       . carvedilol (COREG) 6.25 MG tablet Take 1 tablet (6.25 mg total) by mouth 2 (two) times daily with a meal.  60 tablet  0  . Cholecalciferol 5000 UNITS CHEW Chew 5,000 Units by mouth daily.      Marland Kitchen dofetilide (TIKOSYN) 250 MCG capsule Take 1 capsule (250 mcg total) by mouth every 12 (twelve) hours.  60 capsule  0  . furosemide (LASIX) 20 MG tablet Take 1 tablet (20 mg total) by mouth daily.  30 tablet  0  . levothyroxine (SYNTHROID,  LEVOTHROID) 125 MCG tablet Take 125 mcg by mouth daily before breakfast.       . losartan (COZAAR) 25 MG tablet Take 1 tablet (25 mg total) by mouth daily.  30 tablet  12  . magnesium oxide (MAG-OX 400) 400 MG tablet Take 400 mg by mouth 2 (two) times daily.       . Multiple Vitamin (MULTIVITAMIN) tablet Take 1 tablet by mouth daily. Natural multivitamin      . OVER THE COUNTER MEDICATION Take 1 tablet by mouth 2 (two) times daily. osteodenx supplement      . OVER THE COUNTER MEDICATION Take 3 tablets by mouth daily at 12 noon. Vegetarian omega green supplement      . Rivaroxaban (XARELTO) 20 MG TABS Take 1 tablet (20 mg total) by mouth daily with supper.  30 tablet  0   No current facility-administered medications for this visit.

## 2013-05-31 NOTE — Assessment & Plan Note (Signed)
Pt doing well on Tikosyn BID.  Compliant with medication and no further episodes of atrial fibrillation since discharge.  QTc stable at .  Mg consistently 2.2 in hospital and pt on supplement so will not recheck today.  K hovered around 4.0 during admission and since pt not on supplement and on diuretic, will check today to ensure no supplement needed.  Has a follow up with Tereso Newcomer, PA next week.

## 2013-06-03 DIAGNOSIS — IMO0001 Reserved for inherently not codable concepts without codable children: Secondary | ICD-10-CM | POA: Diagnosis not present

## 2013-06-03 DIAGNOSIS — M159 Polyosteoarthritis, unspecified: Secondary | ICD-10-CM | POA: Diagnosis not present

## 2013-06-03 DIAGNOSIS — I4891 Unspecified atrial fibrillation: Secondary | ICD-10-CM | POA: Diagnosis not present

## 2013-06-03 DIAGNOSIS — Z8674 Personal history of sudden cardiac arrest: Secondary | ICD-10-CM | POA: Diagnosis not present

## 2013-06-03 DIAGNOSIS — I10 Essential (primary) hypertension: Secondary | ICD-10-CM | POA: Diagnosis not present

## 2013-06-04 ENCOUNTER — Other Ambulatory Visit: Payer: Self-pay | Admitting: *Deleted

## 2013-06-04 DIAGNOSIS — Z79899 Other long term (current) drug therapy: Secondary | ICD-10-CM

## 2013-06-05 DIAGNOSIS — I4891 Unspecified atrial fibrillation: Secondary | ICD-10-CM | POA: Diagnosis not present

## 2013-06-05 DIAGNOSIS — M159 Polyosteoarthritis, unspecified: Secondary | ICD-10-CM | POA: Diagnosis not present

## 2013-06-05 DIAGNOSIS — Z8674 Personal history of sudden cardiac arrest: Secondary | ICD-10-CM | POA: Diagnosis not present

## 2013-06-05 DIAGNOSIS — IMO0001 Reserved for inherently not codable concepts without codable children: Secondary | ICD-10-CM | POA: Diagnosis not present

## 2013-06-05 DIAGNOSIS — I10 Essential (primary) hypertension: Secondary | ICD-10-CM | POA: Diagnosis not present

## 2013-06-06 DIAGNOSIS — Z8674 Personal history of sudden cardiac arrest: Secondary | ICD-10-CM | POA: Diagnosis not present

## 2013-06-06 DIAGNOSIS — I10 Essential (primary) hypertension: Secondary | ICD-10-CM | POA: Diagnosis not present

## 2013-06-06 DIAGNOSIS — I4891 Unspecified atrial fibrillation: Secondary | ICD-10-CM | POA: Diagnosis not present

## 2013-06-06 DIAGNOSIS — IMO0001 Reserved for inherently not codable concepts without codable children: Secondary | ICD-10-CM | POA: Diagnosis not present

## 2013-06-06 DIAGNOSIS — M159 Polyosteoarthritis, unspecified: Secondary | ICD-10-CM | POA: Diagnosis not present

## 2013-06-07 ENCOUNTER — Encounter: Payer: Self-pay | Admitting: Physician Assistant

## 2013-06-07 ENCOUNTER — Ambulatory Visit (INDEPENDENT_AMBULATORY_CARE_PROVIDER_SITE_OTHER): Payer: Medicare Other | Admitting: Physician Assistant

## 2013-06-07 VITALS — BP 142/86 | HR 83 | Ht 62.0 in | Wt 133.4 lb

## 2013-06-07 DIAGNOSIS — I6529 Occlusion and stenosis of unspecified carotid artery: Secondary | ICD-10-CM

## 2013-06-07 DIAGNOSIS — I4891 Unspecified atrial fibrillation: Secondary | ICD-10-CM

## 2013-06-07 DIAGNOSIS — I6523 Occlusion and stenosis of bilateral carotid arteries: Secondary | ICD-10-CM

## 2013-06-07 DIAGNOSIS — I5032 Chronic diastolic (congestive) heart failure: Secondary | ICD-10-CM

## 2013-06-07 DIAGNOSIS — I10 Essential (primary) hypertension: Secondary | ICD-10-CM | POA: Diagnosis not present

## 2013-06-07 DIAGNOSIS — I469 Cardiac arrest, cause unspecified: Secondary | ICD-10-CM

## 2013-06-07 DIAGNOSIS — E039 Hypothyroidism, unspecified: Secondary | ICD-10-CM

## 2013-06-07 DIAGNOSIS — I658 Occlusion and stenosis of other precerebral arteries: Secondary | ICD-10-CM

## 2013-06-07 MED ORDER — FUROSEMIDE 20 MG PO TABS: 20.0000 mg | ORAL_TABLET | Freq: Every day | ORAL | Status: DC

## 2013-06-07 NOTE — Progress Notes (Signed)
[address], [address] [place], [place]  [address] Phone: [phone] Fax:  [phone]  Date:  06/07/2013   ID:  Patricia, [name] 1946/04/27, MRN [id number]  PCP:  Thora Lance, MD  Cardiologist:  Dr. Charlton Haws     History of Present Illness: [name] is a 67 y.o. female who returns for f/u after a recent admission to the hospital.    She has a hx of nonischemic cardiomyopathy, HTN, LBBB, hypothyroidism, Hodgkin's disease, breast cancer and AFib. She underwent radiation therapy for Hodgkin's lymphoma in 1988. She has a hx of breast cancer status post mastectomy in 2009 and recurrent breast cancer in 2013. She has completed radiation, Taxotere and Cytoxan in 7/13. Patient was seen by cardiology during admission in 5/13 for neutropenia and fever as well as a/c diastolic CHF. She had tachycardia. It was suspected that her sinus tachycardia was driven by her fever. Flecainide was started in 11/2012 for recurrent PAFib.  ETT was neg for pro-arrhythmia.  Carotid Dopplers 2/14: RICA 0-39%, LICA 60-79% (followup 6 months).  She was admitted 5/22-6/5. She was admitted with pulmonary edema in the setting of hypertensive crisis complicated by PEA arrest. She had ROSC after 7 minutes of CPR and ACLS. She was diuresed with IV Lasix. She developed aspiration pneumonia/pneumonitis (pneumococcal). She was treated with antibiotics. She did have difficulty with hypotension requiring adjustments in her medications. Patient was seen by cardiology. Patient did have initiation and amiodarone for atrial fibrillation. Her flecainide had been stopped and then restarted. She was ultimately started on dofetilide with restoration of NSR. Echo 05/10/13: EF 55-60%, grade 1 diastolic dysfunction, PASP 36, small effusion.  Since d/c, she is feeling better.  Throat is still sore from ETT.  She denies chest pain, palpitations, syncope, near syncope, dyspnea, orthopnea, PND, edema.  Weights at home  stable.  BP was running low and she called in and we decreased her Coreg to 3.125 mg bid.  BPs have been creeping up since then but controlled.   Labs (9/13): K 4.8, creatinine 0.8, ALT 25, Hgb 12.3 Labs (6/14):  K 4=>5, Cr 0.68=>0.7, Hgb 12.4, TSH 7.455, Mg 2.2   Wt Readings from Last 3 Encounters:  06/07/13 133 lb 6.4 oz (60.51 kg)  05/29/13 131 lb 8 oz (59.648 kg)  05/23/13 129 lb 6.6 oz (58.7 kg)     Past Medical History  Diagnosis Date  . Atrial fibrillation     dx in 2008;  CHADS2-VASc=5;  changed to Tikosyn during admx 05/2013; Xarelto started 05/2013  . LBBB (left bundle branch block)   . NICM (nonischemic cardiomyopathy)     a. EF as low as 45%;  b. LHC 5/05:  LM 10%, pLAD 30%, EF 45-50%,   c. Echo 5/13:  EF 60-65%, Gr 1 diast dysfn, MAC;   d.  Echo 11/13:  EF 60-65%, Gr 1 diast dysfn, Tr AI, mild MR;  d.  Echo 05/10/13: EF 55-60%, grade 1 diastolic dysfunction, PASP 36, small effusion.  Marland Kitchen HTN (hypertension)   . Hypothyroidism     2/2 XRT for Hodgkins Lymphoma  . Raynaud's disease   . Anemia   . Blood transfusion     1968  . Arthritis     fingers   . Hodgkin's disease     stage 2b;  s/p XRT 1988  . Breast cancer 2009    [id number] DCIS right breast  . Breast cancer 2013    a. reocurrence right breast;  b. s/p Taxotere, Cytoxan and XRT  . Status post radiation therapy 1988    mantle and periaortic   . Mucositis 04/29/2012  . History of radiation therapy 07/10/12-08/13/12    right breast/chest wall  . Carotid stenosis     a. dopplers 8/13:  RICA 0-39%, LICA 60-79% => repeat due in 01/2013  . PEA (Pulseless electrical activity)     a. admx 05/2013 for APE in setting of HTN emergency c/b PEA arrest and asp pneumonia, VDRF, AFib with RVR    Current Outpatient Prescriptions  Medication Sig Dispense Refill  . calcium carbonate (OS-CAL) 600 MG TABS Take 600 mg by mouth 2 (two) times daily with a meal.       . carvedilol (COREG) 6.25 MG tablet Take 3.125 mg by mouth 2 (two) times  daily with a meal.      . Cholecalciferol 5000 UNITS CHEW Chew 2,000 Units by mouth daily.       Marland Kitchen dofetilide (TIKOSYN) 250 MCG capsule Take 1 capsule (250 mcg total) by mouth every 12 (twelve) hours.  60 capsule  0  . furosemide (LASIX) 20 MG tablet Take 1 tablet (20 mg total) by mouth daily.  30 tablet  0  . levothyroxine (SYNTHROID, LEVOTHROID) 125 MCG tablet Take 125 mcg by mouth daily before breakfast.       . losartan (COZAAR) 25 MG tablet Take 1 tablet (25 mg total) by mouth daily.  30 tablet  12  . magnesium oxide (MAG-OX 400) 400 MG tablet Take 400 mg by mouth 2 (two) times daily.       . Multiple Vitamin (MULTIVITAMIN) tablet Take 1 tablet by mouth daily. Natural multivitamin      . OVER THE COUNTER MEDICATION Take 1 tablet by mouth 2 (two) times daily. osteodenx supplement      . OVER THE COUNTER MEDICATION Take 3 tablets by mouth daily at 12 noon. Plant omega green supplement      . Rivaroxaban (XARELTO) 20 MG TABS Take 1 tablet (20 mg total) by mouth daily with supper.  30 tablet  0   No current facility-administered medications for this visit.    Allergies:    Allergies  Allergen Reactions  . Benazepril     cough  . Contrast Media (Iodinated Diagnostic Agents)   . Epinephrine     REACTION: BUZZ  . Iohexol      Desc: SNEEZING, pt states this happened in 1988. Has had iv contrast since without being premedicated and has had no problems. Chest ct 2005- LC 05/09/12   . Taxotere (Docetaxel) Other (See Comments)    Social History:  The patient  reports that she has never smoked. She has never used smokeless tobacco. She reports that she does not drink alcohol or use illicit drugs.   ROS:  Please see the history of present illness.      All other systems reviewed and negative.   PHYSICAL EXAM: VS:  BP 142/86  Pulse 83  Ht 5\' 2"  (1.575 m)  Wt 133 lb 6.4 oz (60.51 kg)  BMI 24.39 kg/m2  LMP 03/08/1995 Well nourished, well developed, in no acute distress HEENT:  normal Neck: no JVD at 90 Cardiac:  normal S1, S2; RRR; no murmur Lungs:  clear to auscultation bilaterally, no wheezing, rhonchi or rales Abd: soft, nontender, no hepatomegaly Ext: no edema Skin: warm and dry Neuro:  CNs 2-12 intact, no focal abnormalities noted  EKG:  NSR, HR 83, LBBB, QTc 505  ASSESSMENT AND PLAN:  1. Atrial Fibrillation:  Maintaining sinus rhythm.  CHADS2-VASc=5.  She remains on anticoagulation with Xarelto. She has been seen by the cardiovascular risk reduction clinic since discharge. Recent labs demonstrate stable potassium. QTc is acceptable for her current dose of Tikosyn.  2. Chronic Diastolic CHF: Volume stable. Recent BUN and creatinine stable. Continue current therapy.  I spent > 30 minutes reviewing her hospital records, interviewing the patient and updating her problem list. 3. Hypertension: Continue to monitor. If her blood pressure begins to increase, she knows to increase her Coreg back to 6.25 mg twice daily. 4. Pneumonia: Improved. 5. Hypothyroidism: She has follow up with PCP next month for recheck. 6. Status Post PEA Arrest: She has made a good recovery. Her only complaint is residual sore throat from ET tube. 7. Carotid Stenosis: We will make sure that she has follow up carotid Dopplers planned in 08/2013. 8. Disposition: Follow up with Dr. Eden Emms in August as planned.  Signed, Tereso Newcomer, PA-C  06/07/2013 11:45 AM

## 2013-06-07 NOTE — Patient Instructions (Addendum)
A REFILL FOR LASIX HAS BEEN SENT IN TODAY  CAROTID DOPPLERS AFTER 08/19/13  KEEP APPT WITH DR. Eden Emms IN 07/2013

## 2013-06-11 DIAGNOSIS — M159 Polyosteoarthritis, unspecified: Secondary | ICD-10-CM | POA: Diagnosis not present

## 2013-06-11 DIAGNOSIS — Z8674 Personal history of sudden cardiac arrest: Secondary | ICD-10-CM | POA: Diagnosis not present

## 2013-06-11 DIAGNOSIS — I4891 Unspecified atrial fibrillation: Secondary | ICD-10-CM | POA: Diagnosis not present

## 2013-06-11 DIAGNOSIS — I10 Essential (primary) hypertension: Secondary | ICD-10-CM | POA: Diagnosis not present

## 2013-06-11 DIAGNOSIS — IMO0001 Reserved for inherently not codable concepts without codable children: Secondary | ICD-10-CM | POA: Diagnosis not present

## 2013-06-13 DIAGNOSIS — IMO0001 Reserved for inherently not codable concepts without codable children: Secondary | ICD-10-CM | POA: Diagnosis not present

## 2013-06-13 DIAGNOSIS — Z8674 Personal history of sudden cardiac arrest: Secondary | ICD-10-CM | POA: Diagnosis not present

## 2013-06-13 DIAGNOSIS — I10 Essential (primary) hypertension: Secondary | ICD-10-CM | POA: Diagnosis not present

## 2013-06-13 DIAGNOSIS — M159 Polyosteoarthritis, unspecified: Secondary | ICD-10-CM | POA: Diagnosis not present

## 2013-06-13 DIAGNOSIS — I4891 Unspecified atrial fibrillation: Secondary | ICD-10-CM | POA: Diagnosis not present

## 2013-06-18 ENCOUNTER — Other Ambulatory Visit: Payer: Self-pay

## 2013-06-18 DIAGNOSIS — Z8674 Personal history of sudden cardiac arrest: Secondary | ICD-10-CM | POA: Diagnosis not present

## 2013-06-18 DIAGNOSIS — IMO0001 Reserved for inherently not codable concepts without codable children: Secondary | ICD-10-CM | POA: Diagnosis not present

## 2013-06-18 DIAGNOSIS — M159 Polyosteoarthritis, unspecified: Secondary | ICD-10-CM | POA: Diagnosis not present

## 2013-06-18 DIAGNOSIS — I10 Essential (primary) hypertension: Secondary | ICD-10-CM | POA: Diagnosis not present

## 2013-06-18 DIAGNOSIS — I4891 Unspecified atrial fibrillation: Secondary | ICD-10-CM | POA: Diagnosis not present

## 2013-06-18 MED ORDER — RIVAROXABAN 20 MG PO TABS: 20.0000 mg | ORAL_TABLET | Freq: Every day | ORAL | Status: DC

## 2013-06-18 MED ORDER — DOFETILIDE 250 MCG PO CAPS: 250.0000 ug | ORAL_CAPSULE | Freq: Two times a day (BID) | ORAL | Status: DC

## 2013-06-26 DIAGNOSIS — E039 Hypothyroidism, unspecified: Secondary | ICD-10-CM | POA: Diagnosis not present

## 2013-08-12 DIAGNOSIS — E039 Hypothyroidism, unspecified: Secondary | ICD-10-CM | POA: Diagnosis not present

## 2013-08-15 ENCOUNTER — Ambulatory Visit (INDEPENDENT_AMBULATORY_CARE_PROVIDER_SITE_OTHER): Payer: Medicare Other | Admitting: Cardiovascular Disease

## 2013-08-15 ENCOUNTER — Encounter: Payer: Self-pay | Admitting: Cardiovascular Disease

## 2013-08-15 VITALS — BP 104/56 | HR 91 | Ht 62.0 in | Wt 133.0 lb

## 2013-08-15 DIAGNOSIS — I6529 Occlusion and stenosis of unspecified carotid artery: Secondary | ICD-10-CM

## 2013-08-15 DIAGNOSIS — I6522 Occlusion and stenosis of left carotid artery: Secondary | ICD-10-CM

## 2013-08-15 DIAGNOSIS — I10 Essential (primary) hypertension: Secondary | ICD-10-CM | POA: Diagnosis not present

## 2013-08-15 DIAGNOSIS — I5032 Chronic diastolic (congestive) heart failure: Secondary | ICD-10-CM

## 2013-08-15 DIAGNOSIS — J3800 Paralysis of vocal cords and larynx, unspecified: Secondary | ICD-10-CM | POA: Diagnosis not present

## 2013-08-15 DIAGNOSIS — Z79899 Other long term (current) drug therapy: Secondary | ICD-10-CM | POA: Diagnosis not present

## 2013-08-15 DIAGNOSIS — I447 Left bundle-branch block, unspecified: Secondary | ICD-10-CM

## 2013-08-15 DIAGNOSIS — I4891 Unspecified atrial fibrillation: Secondary | ICD-10-CM

## 2013-08-15 MED ORDER — FUROSEMIDE 20 MG PO TABS: 20.0000 mg | ORAL_TABLET | Freq: Every day | ORAL | Status: DC

## 2013-08-15 NOTE — Assessment & Plan Note (Signed)
No bruit on exam Known 60-79% left ICA  F/U duplex scheduled 9/19

## 2013-08-15 NOTE — Assessment & Plan Note (Signed)
Improved Conitnue meds and low sodium diet

## 2013-08-15 NOTE — Assessment & Plan Note (Signed)
IVCD stable No high grade heart block or syncope

## 2013-08-15 NOTE — Assessment & Plan Note (Addendum)
Maint NSR with no palpitations Continue tikosyn  ECG in clinic  Labs today since she is on lasix

## 2013-08-15 NOTE — Progress Notes (Signed)
Patient ID: [name], female   DOB: [birth year], 67 y.o.   MRN: [id number] [name] is a 67 y.o. female who returns for f/u after a recent admission to the hospital.  She has a hx of nonischemic cardiomyopathy, HTN, LBBB, hypothyroidism, Hodgkin's disease, breast cancer and AFib. She underwent radiation therapy for Hodgkin's lymphoma in 1988. She has a hx of breast cancer status post mastectomy in 2009 and recurrent breast cancer in 2013. She has completed radiation, Taxotere and Cytoxan in 7/13. Patient was seen by cardiology during admission in 5/13 for neutropenia and fever as well as a/c diastolic CHF. She had tachycardia. It was suspected that her sinus tachycardia was driven by her fever. Flecainide was started in 11/2012 for recurrent PAFib. ETT was neg for pro-arrhythmia. Carotid Dopplers 2/14: RICA 0-39%, LICA 60-79% (followup 6 months).  She was admitted 5/22-6/5. She was admitted with pulmonary edema in the setting of hypertensive crisis complicated by PEA arrest. She had ROSC after 7 minutes of CPR and ACLS. She was diuresed with IV Lasix. She developed aspiration pneumonia/pneumonitis (pneumococcal). She was treated with antibiotics. She did have difficulty with hypotension requiring adjustments in her medications. Patient was seen by cardiology. Patient did have initiation and amiodarone for atrial fibrillation. Her flecainide had been stopped and then restarted. She was ultimately started on dofetilide with restoration of NSR. Echo 05/10/13: EF 55-60%, grade 1 diastolic dysfunction, PASP 36, small effusion.  Nees f/u duplex  Needs labs for Tikosyn and ECG in clinic.  Still hoarse from intubation and would like referral to [employer]  ROS: Denies fever, malais, weight loss, blurry vision, decreased visual acuity, cough, sputum, SOB, hemoptysis, pleuritic pain, palpitaitons, heartburn, abdominal pain, melena, lower extremity edema, claudication, or rash.  All other systems reviewed  and negative  General: Affect appropriate Healthy:  appears stated age HEENT: normal Neck supple with no adenopathy JVP normal no bruits no thyromegaly Lungs clear with no wheezing and good diaphragmatic motion Heart:  S1/S2 no murmur, no rub, gallop or click PMI normal Abdomen: benighn, BS positve, no tenderness, no AAA no bruit.  No HSM or HJR Distal pulses intact with no bruits No edema Neuro non-focal Skin warm and dry No muscular weakness   Current Outpatient Prescriptions  Medication Sig Dispense Refill  . calcium carbonate (OS-CAL) 600 MG TABS Take 600 mg by mouth 2 (two) times daily with a meal.       . carvedilol (COREG) 6.25 MG tablet Take 3.125 mg by mouth 2 (two) times daily with a meal.      . Cholecalciferol 5000 UNITS CHEW Chew 2,000 Units by mouth daily.       Marland Kitchen dofetilide (TIKOSYN) 250 MCG capsule Take 1 capsule (250 mcg total) by mouth every 12 (twelve) hours.  60 capsule  6  . furosemide (LASIX) 20 MG tablet Take 1 tablet (20 mg total) by mouth daily.  30 tablet  11  . levothyroxine (SYNTHROID, LEVOTHROID) 112 MCG tablet Take 112 mcg by mouth daily before breakfast.      . losartan (COZAAR) 25 MG tablet Take 1 tablet (25 mg total) by mouth daily.  30 tablet  12  . magnesium oxide (MAG-OX 400) 400 MG tablet Take 400 mg by mouth 2 (two) times daily.       . Multiple Vitamin (MULTIVITAMIN) tablet Take 1 tablet by mouth daily. Natural multivitamin      . OVER THE COUNTER MEDICATION Take 1 tablet by mouth 2 (two) times  daily. osteodenx supplement      . OVER THE COUNTER MEDICATION Take 3 tablets by mouth daily at 12 noon. Plant omega green supplement      . Rivaroxaban (XARELTO) 20 MG TABS Take 1 tablet (20 mg total) by mouth daily with supper.  30 tablet  6   No current facility-administered medications for this visit.    Allergies  Benazepril; Contrast media; Epinephrine; Iohexol; and Taxotere  Electrocardiogram:  SR IVCD right superior axis  6.29.14  Rate 83    Assessment and Plan

## 2013-08-15 NOTE — Patient Instructions (Signed)
Your physician wants you to follow-up in:  6 MONTHS WITH DR Haywood Filler will receive a reminder letter in the mail two months in advance. If you don't receive a letter, please call our office to schedule the follow-up appointment. Your physician recommends that you continue on your current medications as directed. Please refer to the Current Medication list given to you today. Your physician recommends that you return for lab work in:   TODAY BMET  MAG  DR CHRIS NEWMAN    APPT   08-16-13 AT 1:30 PM

## 2013-08-16 DIAGNOSIS — R490 Dysphonia: Secondary | ICD-10-CM | POA: Diagnosis not present

## 2013-08-16 DIAGNOSIS — R040 Epistaxis: Secondary | ICD-10-CM | POA: Diagnosis not present

## 2013-08-16 LAB — MAGNESIUM: Magnesium: 2.1 mg/dL (ref 1.5–2.5)

## 2013-08-16 LAB — BASIC METABOLIC PANEL
Calcium: 9.3 mg/dL (ref 8.4–10.5)
Chloride: 104 mEq/L (ref 96–112)
Creatinine, Ser: 0.8 mg/dL (ref 0.4–1.2)
GFR: 78.09 mL/min (ref >60.00)

## 2013-08-21 ENCOUNTER — Other Ambulatory Visit: Payer: Self-pay | Admitting: *Deleted

## 2013-08-21 DIAGNOSIS — I10 Essential (primary) hypertension: Secondary | ICD-10-CM

## 2013-08-21 DIAGNOSIS — I5032 Chronic diastolic (congestive) heart failure: Secondary | ICD-10-CM

## 2013-08-21 MED ORDER — FUROSEMIDE 20 MG PO TABS: 20.0000 mg | ORAL_TABLET | Freq: Every day | ORAL | Status: DC

## 2013-08-21 MED ORDER — RIVAROXABAN 20 MG PO TABS: 20.0000 mg | ORAL_TABLET | Freq: Every day | ORAL | Status: DC

## 2013-08-29 ENCOUNTER — Ambulatory Visit (INDEPENDENT_AMBULATORY_CARE_PROVIDER_SITE_OTHER): Payer: Medicare Other | Admitting: General Surgery

## 2013-09-02 ENCOUNTER — Other Ambulatory Visit: Payer: Medicare Other

## 2013-09-04 ENCOUNTER — Encounter (INDEPENDENT_AMBULATORY_CARE_PROVIDER_SITE_OTHER): Payer: Medicare Other

## 2013-09-04 ENCOUNTER — Other Ambulatory Visit: Payer: Medicare Other

## 2013-09-04 ENCOUNTER — Other Ambulatory Visit (INDEPENDENT_AMBULATORY_CARE_PROVIDER_SITE_OTHER): Payer: Medicare Other

## 2013-09-04 DIAGNOSIS — I6529 Occlusion and stenosis of unspecified carotid artery: Secondary | ICD-10-CM

## 2013-09-04 DIAGNOSIS — Z79899 Other long term (current) drug therapy: Secondary | ICD-10-CM

## 2013-09-04 LAB — BASIC METABOLIC PANEL
Calcium: 9.5 mg/dL (ref 8.4–10.5)
Creatinine, Ser: 0.8 mg/dL (ref 0.4–1.2)
GFR: 74.75 mL/min (ref >60.00)
Sodium: 136 mEq/L (ref 135–145)

## 2013-09-05 ENCOUNTER — Encounter: Payer: Self-pay | Admitting: Physician Assistant

## 2013-09-05 ENCOUNTER — Other Ambulatory Visit: Payer: Self-pay | Admitting: *Deleted

## 2013-09-05 MED ORDER — DOFETILIDE 250 MCG PO CAPS: 250.0000 ug | ORAL_CAPSULE | Freq: Two times a day (BID) | ORAL | Status: DC

## 2013-09-05 MED ORDER — RIVAROXABAN 20 MG PO TABS
20.0000 mg | ORAL_TABLET | Freq: Every day | ORAL | Status: DC
Start: 2013-09-05 — End: 2015-07-17

## 2013-09-06 ENCOUNTER — Telehealth: Payer: Self-pay | Admitting: *Deleted

## 2013-09-06 DIAGNOSIS — I6529 Occlusion and stenosis of unspecified carotid artery: Secondary | ICD-10-CM

## 2013-09-06 NOTE — Telephone Encounter (Signed)
pt notified about caroitd resesults with verbal understanding; Scheduled a 6 month f/u carotid per Bing Neighbors. PA to be done 02/18/14 1:30

## 2013-09-12 ENCOUNTER — Ambulatory Visit (INDEPENDENT_AMBULATORY_CARE_PROVIDER_SITE_OTHER): Payer: Medicare Other | Admitting: General Surgery

## 2013-09-12 ENCOUNTER — Encounter (INDEPENDENT_AMBULATORY_CARE_PROVIDER_SITE_OTHER): Payer: Self-pay | Admitting: General Surgery

## 2013-09-12 VITALS — BP 124/80 | HR 80 | Temp 98.0°F | Resp 14 | Ht 62.0 in | Wt 136.2 lb

## 2013-09-12 DIAGNOSIS — C50919 Malignant neoplasm of unspecified site of unspecified female breast: Secondary | ICD-10-CM

## 2013-09-12 DIAGNOSIS — C50911 Malignant neoplasm of unspecified site of right female breast: Secondary | ICD-10-CM

## 2013-09-12 NOTE — Progress Notes (Signed)
Chief complaint: Followup recurrent cancer right breast   History: Patient returns now approaching 1 1/2 years after reexcision of recurrent triple negative invasive cancer of the right breast occurring near her previous mastectomy wound. She is status post total mastectomy and negative sentinel lymph node biopsy for in situ and small area of triple negative invasive carcinoma with original diagnosis of 2009. She did not receive any adjuvant treatment at that time. Following reexcision she  completed her chemotherapy and  Radiation. She reports no problems. Specifically no breast or skin or chest wall lesions, unusual pain, shortness of breath or any other complaints. Her mammogram is due in January.  She reports she had a severe episode of pneumonia in May which actually resulted in CPR and arrest at home and she was in the ICU for a couple of weeks. She has made unfortunately a complete recovery. She had an effusion but no evidence of malignancy.  Exam: BP 124/80  Pulse 80  Temp(Src) 98 F (36.7 C) (Temporal)  Resp 14  Ht 5\' 2"  (1.575 m)  Wt 136 lb 3.2 oz (61.78 kg)  BMI 24.91 kg/m2  LMP 03/08/1995 General: Appears well Skin: No rash or infection Lymph nodes: No cervical, supraclavicular or axillary nodes palpable Lungs: Clear equal breath sounds bilaterally Breasts: Reconstructed breast on the right. There are no subcutaneous masses or rash or skin lesions with particular attention to the reexcision site. No masses palpable in the left breast. Again no axillary adenopathy.  Assessment and plan: 1 1/2 years status post excision of chest wall recurrence of carpal negative breast cancer now status post chemoradiation therapy. No clinical evidence of recurrence. Mammogram in Jan. She will return here in 6 months.

## 2013-10-07 DIAGNOSIS — Z23 Encounter for immunization: Secondary | ICD-10-CM | POA: Diagnosis not present

## 2013-11-20 DIAGNOSIS — C50919 Malignant neoplasm of unspecified site of unspecified female breast: Secondary | ICD-10-CM | POA: Diagnosis not present

## 2013-11-20 DIAGNOSIS — M899 Disorder of bone, unspecified: Secondary | ICD-10-CM | POA: Diagnosis not present

## 2013-11-20 DIAGNOSIS — E7800 Pure hypercholesterolemia, unspecified: Secondary | ICD-10-CM | POA: Diagnosis not present

## 2013-11-20 DIAGNOSIS — R0989 Other specified symptoms and signs involving the circulatory and respiratory systems: Secondary | ICD-10-CM | POA: Diagnosis not present

## 2013-11-20 DIAGNOSIS — E039 Hypothyroidism, unspecified: Secondary | ICD-10-CM | POA: Diagnosis not present

## 2013-11-20 DIAGNOSIS — I429 Cardiomyopathy, unspecified: Secondary | ICD-10-CM | POA: Diagnosis not present

## 2013-11-29 ENCOUNTER — Ambulatory Visit (INDEPENDENT_AMBULATORY_CARE_PROVIDER_SITE_OTHER): Payer: Medicare Other | Admitting: Cardiovascular Disease

## 2013-11-29 ENCOUNTER — Telehealth: Payer: Self-pay | Admitting: Cardiovascular Disease

## 2013-11-29 ENCOUNTER — Encounter: Payer: Self-pay | Admitting: Cardiovascular Disease

## 2013-11-29 VITALS — BP 122/72 | HR 72 | Ht 62.0 in | Wt 133.0 lb

## 2013-11-29 DIAGNOSIS — I4891 Unspecified atrial fibrillation: Secondary | ICD-10-CM | POA: Diagnosis not present

## 2013-11-29 DIAGNOSIS — I6529 Occlusion and stenosis of unspecified carotid artery: Secondary | ICD-10-CM | POA: Diagnosis not present

## 2013-11-29 DIAGNOSIS — I658 Occlusion and stenosis of other precerebral arteries: Secondary | ICD-10-CM

## 2013-11-29 DIAGNOSIS — I169 Hypertensive crisis, unspecified: Secondary | ICD-10-CM

## 2013-11-29 DIAGNOSIS — I10 Essential (primary) hypertension: Secondary | ICD-10-CM

## 2013-11-29 DIAGNOSIS — I447 Left bundle-branch block, unspecified: Secondary | ICD-10-CM

## 2013-11-29 DIAGNOSIS — I6523 Occlusion and stenosis of bilateral carotid arteries: Secondary | ICD-10-CM

## 2013-11-29 NOTE — Assessment & Plan Note (Signed)
Left ICA 60-79% stenosis.  No TIA symptoms.  Continue antiplatelet Rx and F/U carotid duplex in 6 months   

## 2013-11-29 NOTE — Telephone Encounter (Signed)
New Message  Tooth extraction and a dental implant w/ Dr. Vernard Gambles are antibiotics needed or will you leave it up to Dr. Vernard Gambles Please assist

## 2013-11-29 NOTE — Assessment & Plan Note (Signed)
Incomplete no high grade AV block or chronotropic incompetence  Stable

## 2013-11-29 NOTE — Assessment & Plan Note (Signed)
Maint NSR continue tikosyn and xarelto   

## 2013-11-29 NOTE — Progress Notes (Signed)
Patient ID: [name], female   DOB: 16-Oct-1946, 67 y.o.   MRN: [id number] [name] is a 67 y.o. female who returns for f/u after a recent admission to the hospital.  She has a hx of nonischemic cardiomyopathy, HTN, LBBB, hypothyroidism, Hodgkin's disease, breast cancer and AFib. She underwent radiation therapy for Hodgkin's lymphoma in 1988. She has a hx of breast cancer status post mastectomy in 2009 and recurrent breast cancer in 2013. She has completed radiation, Taxotere and Cytoxan in 7/13. Patient was seen by cardiology during admission in 5/13 for neutropenia and fever as well as a/c diastolic CHF. She had tachycardia. It was suspected that her sinus tachycardia was driven by her fever. Flecainide was started in 11/2012 for recurrent PAFib. ETT was neg for pro-arrhythmia. Carotid Dopplers 09/05/13  RICA 0-39%, LICA 60-79% (followup 6 months).  She was admitted 5/22-6/5. She was admitted with pulmonary edema in the setting of hypertensive crisis complicated by PEA arrest. She had ROSC after 7 minutes of CPR and ACLS. She was diuresed with IV Lasix. She developed aspiration pneumonia/pneumonitis (pneumococcal). She was treated with antibiotics. She did have difficulty with hypotension requiring adjustments in her medications.Patient did have initiation and amiodarone for atrial fibrillation. Her flecainide had been stopped and then restarted. She was ultimately started on dofetilide with restoration of NSR  . Echo 05/10/13: EF 55-60%, grade 1 diastolic dysfunction, PASP 36, small effusion.   Hoarseness better and Narda Bonds indicated no major issues She is building a log cabin in her neighborhood      ROS: Denies fever, malais, weight loss, blurry vision, decreased visual acuity, cough, sputum, SOB, hemoptysis, pleuritic pain, palpitaitons, heartburn, abdominal pain, melena, lower extremity edema, claudication, or rash.  All other systems reviewed and negative  General: Affect  appropriate Healthy:  appears stated age HEENT: normal Neck supple with no adenopathy JVP normal no bruits no thyromegaly Lungs clear with no wheezing and good diaphragmatic motion Heart:  S1/S2 no murmur, no rub, gallop or click PMI normal Abdomen: benighn, BS positve, no tenderness, no AAA no bruit.  No HSM or HJR Distal pulses intact with no bruits No edema Neuro non-focal Skin warm and dry No muscular weakness   Current Outpatient Prescriptions  Medication Sig Dispense Refill  . calcium carbonate (OS-CAL) 600 MG TABS Take 600 mg by mouth 2 (two) times daily with a meal.       . carvedilol (COREG) 6.25 MG tablet Take 6.25 mg by mouth 2 (two) times daily with a meal.       . Cholecalciferol (VITAMIN D) 2000 UNITS CAPS Take by mouth.      . dofetilide (TIKOSYN) 250 MCG capsule Take 1 capsule (250 mcg total) by mouth every 12 (twelve) hours.  180 capsule  3  . furosemide (LASIX) 20 MG tablet Take 1 tablet (20 mg total) by mouth daily.  90 tablet  3  . levothyroxine (SYNTHROID, LEVOTHROID) 112 MCG tablet Take 112 mcg by mouth daily before breakfast.      . losartan (COZAAR) 25 MG tablet Take 1 tablet (25 mg total) by mouth daily.  30 tablet  12  . magnesium oxide (MAG-OX 400) 400 MG tablet Take 400 mg by mouth 2 (two) times daily.       . Multiple Vitamin (MULTIVITAMIN) tablet Take 1 tablet by mouth daily. Natural multivitamin      . OVER THE COUNTER MEDICATION Take 1 tablet by mouth 2 (two) times daily. osteodenx supplement      .  OVER THE COUNTER MEDICATION Take 3 tablets by mouth daily at 12 noon. Plant omega green supplement      . OVER THE COUNTER MEDICATION Mental Clarity, daily      . Rivaroxaban (XARELTO) 20 MG TABS tablet Take 1 tablet (20 mg total) by mouth daily with supper.  90 tablet  3   No current facility-administered medications for this visit.    Allergies  Benazepril; Contrast media; Epinephrine; Iohexol; and Taxotere  Electrocardiogram:  SR rate 83  IVCD    Assessment and Plan

## 2013-11-29 NOTE — Patient Instructions (Signed)
Your physician wants you to follow-up in: April WITH  DR Eden Emms  CAROTID  SAME  DAY   You will receive a reminder letter in the mail two months in advance. If you don't receive a letter, please call our office to schedule the follow-up appointment.   Your physician recommends that you continue on your current medications as directed. Please refer to the Current Medication list given to you today. Your physician has requested that you have a carotid duplex. This test is an ultrasound of the carotid arteries in your neck. It looks at blood flow through these arteries that supply the brain with blood. Allow one hour for this exam. There are no restrictions or special instructions. DUE IN  APRIL

## 2013-11-29 NOTE — Telephone Encounter (Signed)
PT  NOTIFIED   PER  GUIDELINES SBE   REQUIRED IF  HAVE  HAD   VALVES  REPLACED .[name]

## 2013-11-29 NOTE — Assessment & Plan Note (Signed)
Well controlled.  Continue current medications and low sodium Dash type diet.    

## 2013-12-26 ENCOUNTER — Telehealth: Payer: Self-pay | Admitting: Cardiovascular Disease

## 2013-12-26 DIAGNOSIS — M949 Disorder of cartilage, unspecified: Secondary | ICD-10-CM | POA: Diagnosis not present

## 2013-12-26 DIAGNOSIS — M899 Disorder of bone, unspecified: Secondary | ICD-10-CM | POA: Diagnosis not present

## 2013-12-26 DIAGNOSIS — D239 Other benign neoplasm of skin, unspecified: Secondary | ICD-10-CM | POA: Diagnosis not present

## 2013-12-26 DIAGNOSIS — L821 Other seborrheic keratosis: Secondary | ICD-10-CM | POA: Diagnosis not present

## 2013-12-26 NOTE — Telephone Encounter (Signed)
Should stop 2 days before surgery and restart day after

## 2013-12-26 NOTE — Telephone Encounter (Signed)
WILL FORWARD TO DR NISHAN FOR  REVIEW./CY 

## 2013-12-26 NOTE — Telephone Encounter (Signed)
New message       Pt wants to know if she can stop her xarelto for a tooth extraction and implant. Dr Clovis Riley is doing the procedure and he faxed the request last week per pt.

## 2013-12-27 NOTE — Telephone Encounter (Signed)
PT  NOTIFIED ./CY 

## 2014-01-08 ENCOUNTER — Other Ambulatory Visit: Payer: Self-pay | Admitting: Cardiovascular Disease

## 2014-01-17 ENCOUNTER — Encounter: Payer: Self-pay | Admitting: *Deleted

## 2014-01-21 ENCOUNTER — Encounter: Payer: Self-pay | Admitting: Radiation Oncology

## 2014-01-21 ENCOUNTER — Ambulatory Visit
Admission: RE | Admit: 2014-01-21 | Discharge: 2014-01-21 | Disposition: A | Discharge status: Home / Self Care | Payer: Medicare Other | Source: Ambulatory Visit | Attending: Radiation Oncology | Admitting: Radiation Oncology

## 2014-01-21 VITALS — BP 129/76 | HR 66 | Temp 98.0°F | Resp 20 | Wt 133.6 lb

## 2014-01-21 DIAGNOSIS — C50919 Malignant neoplasm of unspecified site of unspecified female breast: Secondary | ICD-10-CM

## 2014-01-21 NOTE — Progress Notes (Signed)
CC: Dr. Coral Ceo  Ms. Qualls visits today approximately 1-1/2 years following completion of radiation therapy following excision of a right chest wall breast recurrence. She is approximately 27 years out from completion of radiation therapy for stage IIB nodular sclerosing Hodgkin's disease. She's had numerous toxicities from mantle radiation including hypothyroidism, breast cancer, and cardiac toxicity. She is scheduled for a maxillary dental extraction and implant with Dr. Clovis Riley later this month. She visits today inquiring about whether not radiation therapy he may have made her neck muscles weaker. She is not having any neck discomfort. She has been seen by neurology in the past. She maintains followup with Dr. Humphrey Rolls for her breast cancer.  Physical examination: Alert and oriented. Filed Vitals:   01/21/14 1114  BP: 129/76  Pulse: 66  Temp: 98 F (36.7 C)  Resp: 20   On inspection of her neck she appears to have a pronounced cervical lordosis. He is difficult to know whether not she has muscle atrophy.  Impression: History of neck muscle weakness which I doubt is related to her radiation therapy. Her cervical spine curvature her may be affecting mechanics of her neck muscles.  Plan: I suggest that she see an orthopedist, and possibly undergo physical therapy. She will as Dr. Dalbert Batman for a referral to Dr. Vickki Hearing of [facility]

## 2014-01-21 NOTE — Progress Notes (Signed)
Pt reports discomfort in her neck, 2/10 on pain sale. She states her "head feels like it wants to fall over", feels weak. She states she wants to discuss this problem w/Dr Valere Dross before she sees an orthopedist.  She states she had an MRI of her neck "years ago" and was told she had no muscular issues.  She states her mammogram is due in March when she sees her Gyn, Dr Harrington Challenger.

## 2014-01-31 DIAGNOSIS — M542 Cervicalgia: Secondary | ICD-10-CM | POA: Diagnosis not present

## 2014-01-31 DIAGNOSIS — G709 Myoneural disorder, unspecified: Secondary | ICD-10-CM | POA: Diagnosis not present

## 2014-02-12 DIAGNOSIS — M542 Cervicalgia: Secondary | ICD-10-CM | POA: Diagnosis not present

## 2014-02-17 DIAGNOSIS — M542 Cervicalgia: Secondary | ICD-10-CM | POA: Diagnosis not present

## 2014-02-18 ENCOUNTER — Other Ambulatory Visit (HOSPITAL_COMMUNITY): Payer: Medicare Other

## 2014-02-18 ENCOUNTER — Encounter (HOSPITAL_COMMUNITY): Payer: Medicare Other

## 2014-02-19 DIAGNOSIS — R05 Cough: Secondary | ICD-10-CM | POA: Diagnosis not present

## 2014-02-19 DIAGNOSIS — R059 Cough, unspecified: Secondary | ICD-10-CM | POA: Diagnosis not present

## 2014-02-19 DIAGNOSIS — J40 Bronchitis, not specified as acute or chronic: Secondary | ICD-10-CM | POA: Diagnosis not present

## 2014-02-24 DIAGNOSIS — M542 Cervicalgia: Secondary | ICD-10-CM | POA: Diagnosis not present

## 2014-02-27 DIAGNOSIS — M542 Cervicalgia: Secondary | ICD-10-CM | POA: Diagnosis not present

## 2014-03-03 DIAGNOSIS — M542 Cervicalgia: Secondary | ICD-10-CM | POA: Diagnosis not present

## 2014-03-06 ENCOUNTER — Other Ambulatory Visit (HOSPITAL_BASED_OUTPATIENT_CLINIC_OR_DEPARTMENT_OTHER): Payer: Medicare Other

## 2014-03-06 DIAGNOSIS — M542 Cervicalgia: Secondary | ICD-10-CM | POA: Diagnosis not present

## 2014-03-06 DIAGNOSIS — C50919 Malignant neoplasm of unspecified site of unspecified female breast: Secondary | ICD-10-CM

## 2014-03-06 DIAGNOSIS — C50911 Malignant neoplasm of unspecified site of right female breast: Secondary | ICD-10-CM

## 2014-03-06 LAB — COMPREHENSIVE METABOLIC PANEL (CC13)
ALT: 13 U/L (ref 0–55)
ANION GAP: 9 meq/L (ref 3–11)
AST: 15 U/L (ref 5–34)
Albumin: 3.6 g/dL (ref 3.5–5.0)
Alkaline Phosphatase: 115 U/L (ref 40–150)
BILIRUBIN TOTAL: 0.47 mg/dL (ref 0.20–1.20)
BUN: 24 mg/dL (ref 7.0–26.0)
CO2: 28 meq/L (ref 22–29)
CREATININE: 0.9 mg/dL (ref 0.6–1.1)
Calcium: 9.4 mg/dL (ref 8.4–10.4)
Chloride: 103 mEq/L (ref 98–109)
Glucose: 85 mg/dl (ref 70–140)
Potassium: 4.9 mEq/L (ref 3.5–5.1)
Sodium: 140 mEq/L (ref 136–145)
Total Protein: 6.7 g/dL (ref 6.4–8.3)

## 2014-03-06 LAB — CBC WITH DIFFERENTIAL/PLATELET
BASO%: 1.5 % (ref 0.0–2.0)
Basophils Absolute: 0.1 10*3/uL (ref 0.0–0.1)
EOS%: 3.2 % (ref 0.0–7.0)
Eosinophils Absolute: 0.2 10*3/uL (ref 0.0–0.5)
HEMATOCRIT: 38.6 % (ref 34.8–46.6)
HGB: 12.4 g/dL (ref 11.6–15.9)
LYMPH%: 18.7 % (ref 14.0–49.7)
MCH: 29.9 pg (ref 25.1–34.0)
MCHC: 32.1 g/dL (ref 31.5–36.0)
MCV: 93 fL (ref 79.5–101.0)
MONO#: 0.8 10*3/uL (ref 0.1–0.9)
MONO%: 11.1 % (ref 0.0–14.0)
NEUT#: 4.7 10*3/uL (ref 1.5–6.5)
NEUT%: 65.5 % (ref 38.4–76.8)
PLATELETS: 409 10*3/uL — AB (ref 145–400)
RBC: 4.15 10*6/uL (ref 3.70–5.45)
RDW: 13.7 % (ref 11.2–14.5)
WBC: 7.1 10*3/uL (ref 3.9–10.3)
lymph#: 1.3 10*3/uL (ref 0.9–3.3)

## 2014-03-10 ENCOUNTER — Telehealth: Payer: Self-pay | Admitting: Oncology

## 2014-03-10 DIAGNOSIS — M542 Cervicalgia: Secondary | ICD-10-CM | POA: Diagnosis not present

## 2014-03-10 NOTE — Telephone Encounter (Signed)
, °

## 2014-03-13 ENCOUNTER — Telehealth: Payer: Self-pay | Admitting: Hematology and Oncology

## 2014-03-13 ENCOUNTER — Ambulatory Visit (HOSPITAL_BASED_OUTPATIENT_CLINIC_OR_DEPARTMENT_OTHER): Payer: Medicare Other | Admitting: Hematology and Oncology

## 2014-03-13 ENCOUNTER — Telehealth: Payer: Self-pay

## 2014-03-13 VITALS — BP 119/67 | HR 78 | Temp 98.4°F | Resp 18 | Ht 62.0 in | Wt 133.2 lb

## 2014-03-13 DIAGNOSIS — D473 Essential (hemorrhagic) thrombocythemia: Secondary | ICD-10-CM | POA: Diagnosis not present

## 2014-03-13 DIAGNOSIS — C44509 Unspecified malignant neoplasm of skin of other part of trunk: Secondary | ICD-10-CM | POA: Diagnosis not present

## 2014-03-13 DIAGNOSIS — M542 Cervicalgia: Secondary | ICD-10-CM | POA: Diagnosis not present

## 2014-03-13 DIAGNOSIS — C8190 Hodgkin lymphoma, unspecified, unspecified site: Secondary | ICD-10-CM

## 2014-03-13 DIAGNOSIS — Z853 Personal history of malignant neoplasm of breast: Secondary | ICD-10-CM | POA: Diagnosis not present

## 2014-03-13 DIAGNOSIS — Z8571 Personal history of Hodgkin lymphoma: Secondary | ICD-10-CM

## 2014-03-13 DIAGNOSIS — C44599 Other specified malignant neoplasm of skin of other part of trunk: Secondary | ICD-10-CM

## 2014-03-13 DIAGNOSIS — C50919 Malignant neoplasm of unspecified site of unspecified female breast: Secondary | ICD-10-CM

## 2014-03-13 NOTE — Telephone Encounter (Signed)
, °

## 2014-03-13 NOTE — Telephone Encounter (Signed)
Called patient to inform her we have the paper work that Nathalie discussed with her during her appointment about post-splenectomy vaccines, informed her that when she comes for her labs on 03/20/14 that she can come by the breast cancer center and ask for [facility] and we will have the papers for her to pick up. Patient verified [email or web address], denies any questions or concerns at this time. Informed patient to call with any further questions or concerns.

## 2014-03-14 ENCOUNTER — Other Ambulatory Visit: Payer: Self-pay | Admitting: Obstetrics and Gynecology

## 2014-03-14 ENCOUNTER — Other Ambulatory Visit: Payer: Self-pay | Admitting: Hematology and Oncology

## 2014-03-14 DIAGNOSIS — Z01419 Encounter for gynecological examination (general) (routine) without abnormal findings: Secondary | ICD-10-CM | POA: Diagnosis not present

## 2014-03-14 DIAGNOSIS — Z124 Encounter for screening for malignant neoplasm of cervix: Secondary | ICD-10-CM | POA: Diagnosis not present

## 2014-03-14 DIAGNOSIS — N952 Postmenopausal atrophic vaginitis: Secondary | ICD-10-CM | POA: Diagnosis not present

## 2014-03-14 DIAGNOSIS — Z1231 Encounter for screening mammogram for malignant neoplasm of breast: Secondary | ICD-10-CM | POA: Diagnosis not present

## 2014-03-15 ENCOUNTER — Encounter: Payer: Self-pay | Admitting: Hematology and Oncology

## 2014-03-15 NOTE — Progress Notes (Deleted)
[name] OFFICE PROGRESS NOTE  [name], MD  DIAGNOSIS: treated for Hodgkins disease 1988 stage 2B treated with radiation  Initial biopsy of RIGHT  breast - invasive ductal ca w/ DCIS on 01/22/2008 - triple negative  PAST THERAPY: 1.Mastectomy Apr 29, 2008 for T1b N0, triple negative breast cancer, s/p reconstruction with implant 2.Chest wall recurrence in 2013 with completion 4/4 planned cycles of adjuvant every 3 week Taxotere/Cytoxan given 04/17/2012 through 06/20/2012 3.Radiation to Right chest wall bed 07/10/2012 through 08/13/2012   CURRENT THERAPY: Annual mammograms, physical exam and system review  INTERVAL HISTORY: [name] 68 y.o. female returns for routine follow up of her triple negative RIGHT breast cancer. Overall she has been doing well. She looks good and remains active despite her cardiac comorbidities of cardiomyopathy, atrial fibrillation and known left bundle branch block. She is eager to return to annual visits as she has numerous physician appointments, and is quite vigilant regarding her health issues. I think this is fine.   MEDICAL HISTORY: Past Medical History  Diagnosis Date  . Atrial fibrillation     dx in 2008;  CHADS2-VASc=5;  changed to Tikosyn during admx 05/2013; Xarelto started 05/2013  . LBBB (left bundle branch block)   . NICM (nonischemic cardiomyopathy)     a. EF as low as 45%;  b. LHC 5/05:  LM 10%, pLAD 30%, EF 45-50%,   c. Echo 5/13:  EF 60-65%, Gr 1 diast dysfn, MAC;   d.  Echo 11/13:  EF 60-65%, Gr 1 diast dysfn, Tr AI, mild MR;  d.  Echo 05/10/13: EF 41-93%, grade 1 diastolic dysfunction, PASP 36, small effusion.  Marland Kitchen HTN (hypertension)   . Hypothyroidism     2/2 XRT for Hodgkins Lymphoma  . Raynaud's disease   . Anemia   . Blood transfusion     1968  . Arthritis     fingers   . Hodgkin's disease     stage 2b;  s/p XRT 1988  . Breast cancer 2009    T1N0M0 DCIS right breast  . Breast cancer 2013    a.  reocurrence right breast;  b. s/p Taxotere, Cytoxan and XRT  . Status post radiation therapy 1988    mantle and periaortic   . Mucositis 04/29/2012  . History of radiation therapy 07/10/12-08/13/12    right breast/chest wall  . Carotid stenosis     a. dopplers 7/90:  RICA 2-40%, LICA 97-35% => repeat due in 01/2013;  b.  Carotid US (3/29): RICA 9-24%; LICA 26-83% - f/u 6 mos  . PEA (Pulseless electrical activity)     a. admx 05/2013 for APE in setting of HTN emergency c/b PEA arrest and asp pneumonia, VDRF, AFib with RVR    SURGICAL HISTORY:  Past Surgical History  Procedure Laterality Date  . Tonsillectomy and adenoidectomy  1965  . Thoractomy  1968  . Btl  1971  . R vein ligation & stripping  1976  . Spleenectomy  1988    for Hodgkin's disease  . Breast lumpectomy  2009    right, lymph node biopsy  . Mastectomy  2009    right breast  . Polypectomy  1990    D&C (also in 1977 and [place])   . Dilation and curettage of uterus    . Tissue expander placement      right breast  . Tissue expander  removal w/ replacement of implant    . Mass excision  03/28/2012  Procedure: EXCISION MASS;  Surgeon: Edward Jolly, MD;  Location: [place];  Service: General;  Laterality: Right;  Excision right chest wall mass  . Portacath placement  04/16/2012    Procedure: INSERTION PORT-A-CATH;  Surgeon: Edward Jolly, MD;  Location: [facility];  Service: General;  Laterality: Left;  Placement of Port-a-Cath, left subclavian  . Port-a-cath removal  10/03/2012    Procedure: REMOVAL PORT-A-CATH;  Surgeon: Edward Jolly, MD;  Location: [facility];  Service: General;  Laterality: N/A;    MEDICATIONS: Current Outpatient Prescriptions  Medication Sig Dispense Refill  . calcium carbonate (OS-CAL) 600 MG TABS Take 600 mg by mouth 2 (two) times daily with a meal.       . carvedilol (COREG) 6.25 MG tablet Take 6.25 mg by mouth 2 (two) times daily with a meal.       . Cholecalciferol  (VITAMIN D) 2000 UNITS CAPS Take by mouth.      . dofetilide (TIKOSYN) 250 MCG capsule Take 1 capsule (250 mcg total) by mouth every 12 (twelve) hours.  180 capsule  3  . furosemide (LASIX) 20 MG tablet Take 1 tablet (20 mg total) by mouth daily.  90 tablet  3  . levothyroxine (SYNTHROID, LEVOTHROID) 112 MCG tablet Take 112 mcg by mouth daily before breakfast.      . losartan (COZAAR) 25 MG tablet TAKE 1 TABLET BY MOUTH EVERY DAY  90 tablet  0  . magnesium oxide (MAG-OX 400) 400 MG tablet Take 400 mg by mouth 2 (two) times daily.       . Multiple Vitamin (MULTIVITAMIN) tablet Take 1 tablet by mouth daily. Natural multivitamin      . OVER THE COUNTER MEDICATION Take 1 tablet by mouth 2 (two) times daily. osteodenx supplement      . OVER THE COUNTER MEDICATION Take 3 tablets by mouth daily at 12 noon. Plant omega green supplement      . OVER THE COUNTER MEDICATION Mental Clarity, daily      . Rivaroxaban (XARELTO) 20 MG TABS tablet Take 1 tablet (20 mg total) by mouth daily with supper.  90 tablet  3   No current facility-administered medications for this visit.    ALLERGIES:  is allergic to benazepril; contrast media; epinephrine; iohexol; potassium-containing compounds; and taxotere.  REVIEW OF SYSTEMS: General: denies unexplained weight loss, fatigue, night sweats, fevers, chills HEENT: denies headaches, blurred vision, dizziness, loss of balance Cardiac: denies chest pain, pressure or palpitations Lungs: denies wheezing, shortness of breath or productive cough Abd: denies nausea, vomiting, constipation, diarrhea, indigestion, blood in stool or urine, dysphagia Extremities: denies numbness, tingling, swelling or pain  The rest of the 14-point review of system was negative.   Filed Vitals:   03/13/14 1000  BP: 119/67  Pulse: 78  Temp: 98.4 F (36.9 C)  Resp: 18   Wt Readings from Last 3 Encounters:  03/13/14 133 lb 3.2 oz (60.419 kg)  01/21/14 133 lb 9.6 oz (60.601 kg)  11/29/13  133 lb (60.328 kg)   ECOG Performance status: 0  PHYSICAL EXAMINATION: 68 yr old white female who appears her stated age  General:  well-nourished in no acute distress.   Eyes:  no scleral icterus.   ENT:  There were no oropharyngeal lesions.  Neck was without thyromegaly.   Lymphatics:  Negative cervical, supraclavicular, axillary, or inguinal adenopathy.  Respiratory: lungs were clear bilaterally without wheezing or crackles.  Cardiovascular:  Regular rate and rhythm, S1/S2, with  known murmur ausculated.  There was no pedal edema.   Breast: no nipple retraction, no nipple discharge, no unusual masses or thickening, negative for palpable abnormalities, mastectomy surgical scar noted to right breast w/ implant. Breasts examined in both supine sitting and lying flat positions. No palpable lymphadenopathy. GI:  abdomen was soft, flat, nontender, nondistended, without organomegaly.   Musculoskeletal:  no spinal tenderness of palpation of vertebral spine.   Skin exam was without echymosis, petichae.   Neuro exam was nonfocal.  Cranial nerves II- XII grossly intact Patient was able to get on and off exam table without assistance.  Gait was normal.  Patient was alerted and oriented.  Attention was good.   Language was appropriate.  Mood was normal without depression.  Speech was not pressured.  Thought content was not tangential.     LABORATORY/RADIOLOGY DATA:  Lab Results  Component Value Date   WBC 7.1 03/06/2014   HGB 12.4 03/06/2014   HCT 38.6 03/06/2014   PLT 409* 03/06/2014   GLUCOSE 85 03/06/2014   CHOL 163 05/18/2013   TRIG 175* 05/18/2013   HDL 26* 05/18/2013   LDLCALC 102* 05/18/2013   ALT 13 03/06/2014   AST 15 03/06/2014   NA 140 03/06/2014   K 4.9 03/06/2014   CL 104 09/04/2013   CREATININE 0.9 03/06/2014   BUN 24.0 03/06/2014   CO2 28 03/06/2014   TSH 7.455* 05/09/2013   INR 1.01 05/09/2013    Mammogram  done last Thursday at Dr. Harle Battiest at [place] should have been  sent to breast center - results obtained and were negative for any evidence of malignancy.    ASSESSMENT AND PLAN: 1. 68 yr old patient with T1bN0, triple negative breast cancer, s/p mastectomy Apr 29, 2008. She is status post reconstruction with implant. 2. Biopsy recurrent disease on right chest wall on 4/10 /2013 showing metastatic ductal carcinoma - surgically excised 3. Completion 4/4 planned cycles of adjuvant every 3 week Taxotere/Cytoxan given 04/17/2012 through 06/20/2012  4. Radiation to Right chest wall bed 07/10/2012 through 08/13/2012   2014 - Most recent mammogram was March 07, 2013. She is due again for mammogram March 2015.  She has met Dr. Humphrey Rolls in the past during a hospitalization; Dr. Humphrey Rolls is out of the office this afternoon.  Ms. Jungman will follow up in one year. She has no complaints.   Burns Spain, AOCNP, NP-C

## 2014-03-15 NOTE — Progress Notes (Signed)
. [facility] OFFICE PROGRESS NOTE  [name], MD  DIAGNOSIS: treated for Hodgkins disease 1988 stage 2B treated with mantle  Radiation S/P splenectomy  Initial biopsy of RIGHT  breast - invasive ductal ca w/ DCIS on 01/22/2008 - triple negative  PAST THERAPY: 1.Mastectomy Apr 29, 2008 for T1b N0, triple negative breast cancer, s/p reconstruction with implant 2.Chest wall recurrence in 2013 with completion 4/4 planned cycles of adjuvant every 3 week Taxotere/Cytoxan given 04/17/2012 through 06/20/2012 3.Radiation to Right chest wall bed 07/10/2012 through 08/13/2012   CURRENT THERAPY: Annual MRI breast physical exam and system review  INTERVAL HISTORY: [name] 68 y.o. female returns for follow up of her triple negative right breast cancer. Overall she has been doing fairly well. She looks good and remains active at present  Patient was hospitalized in 04/2013 due to respiratory arrest, cardiac arrest,CHF,HTN,NICM,LBBB,and pneumococcal pneumonia.Thoracentesis of pleural effusion without malignancy.Amazingly she had a good recovery   and remains well since.    In 01/2014 she was treated for bronchitis with Amoxicillin.No sputum cultures.She continues little congested at times, cough,she denies recent fever,had no recent scans.    She denies  other systemic symptoms at present.No chest pain,edema dyspnea,abdominal or bone pain.  MEDICAL HISTORY: Past Medical History  Diagnosis Date  . Atrial fibrillation     dx in 2008;  CHADS2-VASc=5;  changed to Tikosyn during admx 05/2013; Xarelto started 05/2013  . LBBB (left bundle branch block)   . NICM (nonischemic cardiomyopathy)     a. EF as low as 45%;  b. LHC 5/05:  LM 10%, pLAD 30%, EF 45-50%,   c. Echo 5/13:  EF 60-65%, Gr 1 diast dysfn, MAC;   d.  Echo 11/13:  EF 60-65%, Gr 1 diast dysfn, Tr AI, mild MR;  d.  Echo 05/10/13: EF 84-69%, grade 1 diastolic dysfunction, PASP 36, small effusion.  Marland Kitchen HTN (hypertension)   .  Hypothyroidism     2/2 XRT for Hodgkins Lymphoma  . Raynaud's disease   . Anemia   . Blood transfusion     1968  . Arthritis     fingers   . Hodgkin's disease     stage 2b;  s/p XRT 1988  . Breast cancer 2009    T1N0M0 DCIS right breast  . Breast cancer 2013    a. reocurrence right breast;  b. s/p Taxotere, Cytoxan and XRT  . Status post radiation therapy 1988    mantle and periaortic   . Mucositis 04/29/2012  . History of radiation therapy 07/10/12-08/13/12    right breast/chest wall  . Carotid stenosis     a. dopplers 6/29:  RICA 5-28%, LICA 41-32% => repeat due in 01/2013;  b.  Carotid US (4/40): RICA 1-02%; LICA 72-53% - f/u 6 mos  . PEA (Pulseless electrical activity)     a. admx 05/2013 for APE in setting of HTN emergency c/b PEA arrest and asp pneumonia, VDRF, AFib with RVR    SURGICAL HISTORY:  Past Surgical History  Procedure Laterality Date  . Tonsillectomy and adenoidectomy  1965  . Thoractomy  1968  . Btl  1971  . R vein ligation & stripping  1976  . Spleenectomy  1988    for Hodgkin's disease  . Breast lumpectomy  2009    right, lymph node biopsy  . Mastectomy  2009    right breast  . Polypectomy  1990    D&C (also in 1977 and [place])   .  Dilation and curettage of uterus    . Tissue expander placement      right breast  . Tissue expander  removal w/ replacement of implant    . Mass excision  03/28/2012    Procedure: EXCISION MASS;  Surgeon: Edward Jolly, MD;  Location: [place];  Service: General;  Laterality: Right;  Excision right chest wall mass  . Portacath placement  04/16/2012    Procedure: INSERTION PORT-A-CATH;  Surgeon: Edward Jolly, MD;  Location: [facility];  Service: General;  Laterality: Left;  Placement of Port-a-Cath, left subclavian  . Port-a-cath removal  10/03/2012    Procedure: REMOVAL PORT-A-CATH;  Surgeon: Edward Jolly, MD;  Location: [facility];  Service: General;  Laterality: N/A;    MEDICATIONS: Current  Outpatient Prescriptions  Medication Sig Dispense Refill  . calcium carbonate (OS-CAL) 600 MG TABS Take 600 mg by mouth 2 (two) times daily with a meal.       . carvedilol (COREG) 6.25 MG tablet Take 6.25 mg by mouth 2 (two) times daily with a meal.       . Cholecalciferol (VITAMIN D) 2000 UNITS CAPS Take by mouth.      . dofetilide (TIKOSYN) 250 MCG capsule Take 1 capsule (250 mcg total) by mouth every 12 (twelve) hours.  180 capsule  3  . furosemide (LASIX) 20 MG tablet Take 1 tablet (20 mg total) by mouth daily.  90 tablet  3  . levothyroxine (SYNTHROID, LEVOTHROID) 112 MCG tablet Take 112 mcg by mouth daily before breakfast.      . losartan (COZAAR) 25 MG tablet TAKE 1 TABLET BY MOUTH EVERY DAY  90 tablet  0  . magnesium oxide (MAG-OX 400) 400 MG tablet Take 400 mg by mouth 2 (two) times daily.       . Multiple Vitamin (MULTIVITAMIN) tablet Take 1 tablet by mouth daily. Natural multivitamin      . OVER THE COUNTER MEDICATION Take 1 tablet by mouth 2 (two) times daily. osteodenx supplement      . OVER THE COUNTER MEDICATION Take 3 tablets by mouth daily at 12 noon. Plant omega green supplement      . OVER THE COUNTER MEDICATION Mental Clarity, daily      . Rivaroxaban (XARELTO) 20 MG TABS tablet Take 1 tablet (20 mg total) by mouth daily with supper.  90 tablet  3   No current facility-administered medications for this visit.    ALLERGIES:  is allergic to benazepril; contrast media; epinephrine; iohexol; potassium-containing compounds; and taxotere.  REVIEW OF SYSTEMS: General: denies unexplained weight loss, fatigue, night sweats, fevers, chills HEENT: denies headaches, blurred vision, dizziness, loss of balance Cardiac: denies chest pain, pressure or palpitations Lungs: denies wheezing, shortness of breath or productive cough Abd: denies nausea, vomiting, constipation, diarrhea, indigestion, blood in stool or urine, dysphagia Extremities: denies numbness, tingling, swelling or pain   The rest of the 14-point review of system was negative.   Filed Vitals:   03/13/14 1000  BP: 119/67  Pulse: 78  Temp: 98.4 F (36.9 C)  Resp: 18   Wt Readings from Last 3 Encounters:  03/13/14 133 lb 3.2 oz (60.419 kg)  01/21/14 133 lb 9.6 oz (60.601 kg)  11/29/13 133 lb (60.328 kg)   ECOG Performance status: 0  PHYSICAL EXAMINATION: 68 yr old white female who appears her stated age  General:  well-nourished in no acute distress.   Eyes:  no scleral icterus.   ENT:  There were no oropharyngeal lesions.  Neck was without thyromegaly.   Lymphatics:  Negative cervical, supraclavicular, axillary, or inguinal adenopathy.  Respiratory: lungs were clear bilaterally without wheezing or crackles.  Cardiovascular:  Regular rate and rhythm, S1/S2, with known murmurpresent no pedal edema.   Breast: no nipple retraction, no nipple discharge, no unusual masses or thickening, negative for palpable abnormalities, mastectomy surgical scar noted to right breast w/ implant. Breasts examined in both supine sitting and lying flat positions. No palpable lymphadenopathy. GI:  abdomen was soft, flat, nontender, nondistended, without organomegaly.   Musculoskeletal:  no spinal tenderness of palpation of vertebral spine.   Skin exam was without echymosis, petichae.   Neuro exam was nonfocal.  Cranial nerves II- XII grossly intact Patient was able to get on and off exam table without assistance.  Gait was normal.  Patient was alerted and oriented.  Attention was good.   Language was appropriate.  Mood was normal without depression.  LABORATORY/RADIOLOGY DATA:  Lab Results  Component Value Date   WBC 7.1 03/06/2014   HGB 12.4 03/06/2014   HCT 38.6 03/06/2014   PLT 409* 03/06/2014   GLUCOSE 85 03/06/2014   CHOL 163 05/18/2013   TRIG 175* 05/18/2013   HDL 26* 05/18/2013   LDLCALC 102* 05/18/2013   ALT 13 03/06/2014   AST 15 03/06/2014   NA 140 03/06/2014   K 4.9 03/06/2014   CL 104 09/04/2013   CREATININE 0.9  03/06/2014   BUN 24.0 03/06/2014   CO2 28 03/06/2014   TSH 7.455* 05/09/2013   INR 1.01 05/09/2013    Mammogram  done last Thursday at Dr. Harle Battiest at [facility] should have been sent to breast center - results obtained and were negative for any evidence of malignancy.    ASSESSMENT AND PLAN: 1. 68 yr old patient with T1bN0, triple negative breast cancer, s/p mastectomy Apr 29, 2008. She is status post reconstruction with implant. 2. Biopsy recurrent disease on right chest wall on 4/10 /2013 showing metastatic ductal carcinoma - surgically excised 3. Completion 4/4 planned cycles of adjuvant every 3 week Taxotere/Cytoxan given 04/17/2012 through 06/20/2012  4. Radiation to Right chest wall bed 07/10/2012 through 08/13/2012   2014 - Most recent mammogram was March 08, 2013. She is  requesting MRI breasts and will schedule now . No clinical concern for recurrent breast cancer.  History of Hodgkin's S/P splenectomy Patient was hospitalized for pneumococcal pneumonia,respiratory and cardiac arrest.  She had no updated vaccines since her splenectomy besides Pneumovax she states  I called our Pharmacy and she brought in a list of vaccines recommended following splenectomy.Will provide patient with information and have her come in to receive vaccines as and if  Indicated for revaccination. Need to carefully review record to see which vaccines patient has received following splenectomy and which ones she needs updated.  Will check LDH,CBC,ESR,SPEP with immunoelectropheresis with [facility].  Thrombocytosis due to splenectomy.  Follow up in 1 month  Amada Kingfisher, M.D. Oncology/Hematology [place] [phone] (Office) 03/15/2014

## 2014-03-18 ENCOUNTER — Other Ambulatory Visit: Payer: Self-pay

## 2014-03-18 ENCOUNTER — Telehealth: Payer: Self-pay | Admitting: Oncology

## 2014-03-18 MED ORDER — CARVEDILOL 6.25 MG PO TABS
6.2500 mg | ORAL_TABLET | Freq: Two times a day (BID) | ORAL | Status: DC
Start: 2014-03-18 — End: 2014-08-07

## 2014-03-19 ENCOUNTER — Other Ambulatory Visit: Payer: Self-pay

## 2014-03-19 DIAGNOSIS — C50919 Malignant neoplasm of unspecified site of unspecified female breast: Secondary | ICD-10-CM

## 2014-03-20 ENCOUNTER — Other Ambulatory Visit (HOSPITAL_BASED_OUTPATIENT_CLINIC_OR_DEPARTMENT_OTHER): Payer: Medicare Other

## 2014-03-20 ENCOUNTER — Other Ambulatory Visit: Payer: Medicare Other

## 2014-03-20 ENCOUNTER — Other Ambulatory Visit: Payer: Self-pay

## 2014-03-20 DIAGNOSIS — C44509 Unspecified malignant neoplasm of skin of other part of trunk: Secondary | ICD-10-CM

## 2014-03-20 DIAGNOSIS — C44599 Other specified malignant neoplasm of skin of other part of trunk: Secondary | ICD-10-CM

## 2014-03-20 DIAGNOSIS — C50919 Malignant neoplasm of unspecified site of unspecified female breast: Secondary | ICD-10-CM

## 2014-03-20 LAB — CBC WITH DIFFERENTIAL/PLATELET
BASO%: 2.5 % — ABNORMAL HIGH (ref 0.0–2.0)
Basophils Absolute: 0.2 10*3/uL — ABNORMAL HIGH (ref 0.0–0.1)
EOS ABS: 0.3 10*3/uL (ref 0.0–0.5)
EOS%: 4.2 % (ref 0.0–7.0)
HCT: 39.1 % (ref 34.8–46.6)
HGB: 12.5 g/dL (ref 11.6–15.9)
LYMPH%: 25.6 % (ref 14.0–49.7)
MCH: 30.4 pg (ref 25.1–34.0)
MCHC: 32 g/dL (ref 31.5–36.0)
MCV: 94.9 fL (ref 79.5–101.0)
MONO#: 0.7 10*3/uL (ref 0.1–0.9)
MONO%: 8.7 % (ref 0.0–14.0)
NEUT%: 59 % (ref 38.4–76.8)
NEUTROS ABS: 4.4 10*3/uL (ref 1.5–6.5)
PLATELETS: 293 10*3/uL (ref 145–400)
RBC: 4.11 10*6/uL (ref 3.70–5.45)
RDW: 13.3 % (ref 11.2–14.5)
WBC: 7.5 10*3/uL (ref 3.9–10.3)
lymph#: 1.9 10*3/uL (ref 0.9–3.3)

## 2014-03-20 LAB — COMPREHENSIVE METABOLIC PANEL (CC13)
ALBUMIN: 3.8 g/dL (ref 3.5–5.0)
ALT: 13 U/L (ref 0–55)
ANION GAP: 12 meq/L — AB (ref 3–11)
AST: 15 U/L (ref 5–34)
Alkaline Phosphatase: 132 U/L (ref 40–150)
BUN: 26 mg/dL (ref 7.0–26.0)
CO2: 27 meq/L (ref 22–29)
Calcium: 9.3 mg/dL (ref 8.4–10.4)
Chloride: 104 mEq/L (ref 98–109)
Creatinine: 1 mg/dL (ref 0.6–1.1)
GLUCOSE: 167 mg/dL — AB (ref 70–140)
POTASSIUM: 4.4 meq/L (ref 3.5–5.1)
SODIUM: 143 meq/L (ref 136–145)
TOTAL PROTEIN: 6.6 g/dL (ref 6.4–8.3)
Total Bilirubin: 0.31 mg/dL (ref 0.20–1.20)

## 2014-03-20 NOTE — Progress Notes (Signed)
Pt came by and picked up paperwork Dr.Faidas left her regarding post-splenectomy vaccines. Informed patient if she had any questions to call. Patient denies any questions or concerns at this time, Patient verbalized all understanding.

## 2014-03-21 ENCOUNTER — Telehealth: Payer: Self-pay | Admitting: Oncology

## 2014-03-21 NOTE — Telephone Encounter (Signed)
, °

## 2014-03-24 ENCOUNTER — Other Ambulatory Visit: Payer: Self-pay | Admitting: Hematology and Oncology

## 2014-03-24 ENCOUNTER — Ambulatory Visit (HOSPITAL_COMMUNITY)
Admission: RE | Admit: 2014-03-24 | Discharge: 2014-03-24 | Disposition: A | Discharge status: Home / Self Care | Payer: Medicare Other | Source: Ambulatory Visit | Attending: Hematology and Oncology | Admitting: Hematology and Oncology

## 2014-03-24 DIAGNOSIS — Z9010 Acquired absence of unspecified breast and nipple: Secondary | ICD-10-CM | POA: Insufficient documentation

## 2014-03-24 DIAGNOSIS — N6459 Other signs and symptoms in breast: Secondary | ICD-10-CM | POA: Diagnosis not present

## 2014-03-24 DIAGNOSIS — C50919 Malignant neoplasm of unspecified site of unspecified female breast: Secondary | ICD-10-CM

## 2014-03-24 DIAGNOSIS — Z853 Personal history of malignant neoplasm of breast: Secondary | ICD-10-CM | POA: Diagnosis not present

## 2014-03-24 DIAGNOSIS — C8190 Hodgkin lymphoma, unspecified, unspecified site: Secondary | ICD-10-CM

## 2014-03-24 MED ORDER — GADOBENATE DIMEGLUMINE 529 MG/ML IV SOLN
12.0000 mL | Freq: Once | INTRAVENOUS | Status: AC | PRN
Start: 2014-03-24 — End: 2014-03-24
  Administered 2014-03-24: 12 mL via INTRAVENOUS

## 2014-03-31 ENCOUNTER — Encounter: Payer: Self-pay | Admitting: Cardiovascular Disease

## 2014-03-31 ENCOUNTER — Ambulatory Visit (HOSPITAL_COMMUNITY): Payer: Medicare Other | Attending: Cardiovascular Disease | Admitting: *Deleted

## 2014-03-31 ENCOUNTER — Ambulatory Visit (INDEPENDENT_AMBULATORY_CARE_PROVIDER_SITE_OTHER): Payer: Medicare Other | Admitting: Cardiovascular Disease

## 2014-03-31 VITALS — BP 160/76 | HR 91 | Ht 62.0 in | Wt 134.0 lb

## 2014-03-31 DIAGNOSIS — I6523 Occlusion and stenosis of bilateral carotid arteries: Secondary | ICD-10-CM

## 2014-03-31 DIAGNOSIS — I4891 Unspecified atrial fibrillation: Secondary | ICD-10-CM | POA: Diagnosis not present

## 2014-03-31 DIAGNOSIS — I10 Essential (primary) hypertension: Secondary | ICD-10-CM

## 2014-03-31 DIAGNOSIS — I447 Left bundle-branch block, unspecified: Secondary | ICD-10-CM

## 2014-03-31 DIAGNOSIS — I6529 Occlusion and stenosis of unspecified carotid artery: Secondary | ICD-10-CM | POA: Diagnosis not present

## 2014-03-31 NOTE — Assessment & Plan Note (Signed)
Some white coat component runs fine at home Encouraged her to take ARB in middle of day

## 2014-03-31 NOTE — Progress Notes (Signed)
Patient ID: [name], female   DOB: Sep 30, 1946, 68 y.o.   MRN: [id number] [name] is a 68 y.o. female who returns for f/u after a recent admission to the hospital.  She has a hx of nonischemic cardiomyopathy, HTN, LBBB, hypothyroidism, Hodgkin's disease, breast cancer and AFib. She underwent radiation therapy for Hodgkin's lymphoma in 1988. She has a hx of breast cancer status post mastectomy in 2009 and recurrent breast cancer in 2013. She has completed radiation, Taxotere and Cytoxan in 7/13. Patient was seen by cardiology during admission in 5/13 for neutropenia and fever as well as a/c diastolic CHF. She had tachycardia. It was suspected that her sinus tachycardia was driven by her fever. Flecainide was started in 11/2012 for recurrent PAFib. ETT was neg for pro-arrhythmia. Carotid Dopplers 5/39/76 RICA 7-34%, LICA 19-37% (followup 6 months).  She was admitted 5/22-6/5. She was admitted with pulmonary edema in the setting of hypertensive crisis complicated by PEA arrest. She had ROSC after 7 minutes of CPR and ACLS. She was diuresed with IV Lasix. She developed aspiration pneumonia/pneumonitis (pneumococcal). She was treated with antibiotics. She did have difficulty with hypotension requiring adjustments in her medications.Patient did have initiation and amiodarone for atrial fibrillation. Her flecainide had been stopped and then restarted. She was ultimately started on dofetilide with restoration of NSR  . Echo 05/10/13: EF 90-24%, grade 1 diastolic dysfunction, PASP 36, small effusion.  Carotid from today Left ICA 60-79% stenosis.  No TIA symptoms.    Excited about log cabin she is building in [place]  Should be done in July     ROS: Denies fever, malais, weight loss, blurry vision, decreased visual acuity, cough, sputum, SOB, hemoptysis, pleuritic pain, palpitaitons, heartburn, abdominal pain, melena, lower extremity edema, claudication, or rash.  All other systems reviewed and  negative  General: Affect appropriate Healthy:  appears stated age 39: normal Neck supple with no adenopathy JVP normal left bruits no thyromegaly Lungs clear with no wheezing and good diaphragmatic motion Heart:  S1/S2 no murmur, no rub, gallop or click PMI normal Abdomen: benighn, BS positve, no tenderness, no AAA no bruit.  No HSM or HJR Distal pulses intact with no bruits No edema Neuro non-focal Skin warm and dry No muscular weakness   Current Outpatient Prescriptions  Medication Sig Dispense Refill  . calcium carbonate (OS-CAL) 600 MG TABS Take 600 mg by mouth 2 (two) times daily with a meal.       . carvedilol (COREG) 6.25 MG tablet Take 1 tablet (6.25 mg total) by mouth 2 (two) times daily with a meal.  60 tablet  4  . Cholecalciferol (VITAMIN D) 2000 UNITS CAPS Take by mouth.      . dofetilide (TIKOSYN) 250 MCG capsule Take 1 capsule (250 mcg total) by mouth every 12 (twelve) hours.  180 capsule  3  . furosemide (LASIX) 20 MG tablet Take 1 tablet (20 mg total) by mouth daily.  90 tablet  3  . levothyroxine (SYNTHROID, LEVOTHROID) 112 MCG tablet Take 112 mcg by mouth daily before breakfast.      . losartan (COZAAR) 25 MG tablet TAKE 1 TABLET BY MOUTH EVERY DAY  90 tablet  0  . magnesium oxide (MAG-OX 400) 400 MG tablet Take 400 mg by mouth 2 (two) times daily.       . Multiple Vitamin (MULTIVITAMIN) tablet Take 1 tablet by mouth daily. Natural multivitamin      . OVER THE COUNTER MEDICATION Take 1 tablet by  mouth 2 (two) times daily. osteodenx supplement      . OVER THE COUNTER MEDICATION Take 3 tablets by mouth daily at 12 noon. Plant omega green supplement      . OVER THE COUNTER MEDICATION Mental Clarity, daily      . Rivaroxaban (XARELTO) 20 MG TABS tablet Take 1 tablet (20 mg total) by mouth daily with supper.  90 tablet  3   No current facility-administered medications for this visit.    Allergies  Benazepril; Contrast media; Epinephrine; Iohexol;  Potassium-containing compounds; and Taxotere  Electrocardiogram:  Assessment and Plan

## 2014-03-31 NOTE — Patient Instructions (Signed)
Your physician has requested that you have a carotid duplex. This test is an ultrasound of the carotid arteries in your neck. It looks at blood flow through these arteries that supply the brain with blood. Allow one hour for this exam. There are no restrictions or special instructions. Needs to be done in 6 months, just before next follow up.  Your physician recommends that you continue on your current medications as directed. Please refer to the Current Medication list given to you today.  Your physician wants you to follow-up in: 6 months. You will receive a reminder letter in the mail two months in advance. If you don't receive a letter, please call our office to schedule the follow-up appointment.

## 2014-03-31 NOTE — Assessment & Plan Note (Signed)
Left ICA 60-79% stenosis.  No TIA symptoms.  Continue antiplatelet Rx and F/U carotid duplex in 6 months

## 2014-03-31 NOTE — Progress Notes (Signed)
Carotid Duplex complete

## 2014-03-31 NOTE — Assessment & Plan Note (Signed)
Maint NSR  No palpitations continue tikosyn and xarelto

## 2014-03-31 NOTE — Assessment & Plan Note (Signed)
Chronic no high grade AV block  Yearly ECG

## 2014-03-31 NOTE — Addendum Note (Signed)
Addended by: Dennie Fetters on: 03/31/2014 03:37 PM   Modules accepted: Orders

## 2014-04-01 ENCOUNTER — Telehealth: Payer: Self-pay

## 2014-04-01 DIAGNOSIS — R7301 Impaired fasting glucose: Secondary | ICD-10-CM | POA: Diagnosis not present

## 2014-04-01 NOTE — Telephone Encounter (Signed)
Pt requesting MRI results.  Routed to [facility]

## 2014-04-10 ENCOUNTER — Other Ambulatory Visit: Payer: Medicare Other

## 2014-04-10 ENCOUNTER — Ambulatory Visit: Payer: Medicare Other

## 2014-04-14 ENCOUNTER — Other Ambulatory Visit: Payer: Self-pay

## 2014-04-14 MED ORDER — LOSARTAN POTASSIUM 25 MG PO TABS: ORAL_TABLET | ORAL | Status: DC

## 2014-05-01 ENCOUNTER — Telehealth: Payer: Self-pay | Admitting: *Deleted

## 2014-05-01 NOTE — Telephone Encounter (Signed)
Informed pt that MRI /breast was negative.  Looks good per Dr. Earnest Conroy & OK to see in July.

## 2014-05-01 NOTE — Telephone Encounter (Signed)
Received call from pt stating that she had an MRI of breast & would like to get report.  She states that this was ordered by Dr Owens Loffler & her next appt has been postponed to July but would like report before then.  Note to Dr Earnest Conroy.

## 2014-05-02 ENCOUNTER — Other Ambulatory Visit: Payer: Self-pay

## 2014-05-19 DIAGNOSIS — R059 Cough, unspecified: Secondary | ICD-10-CM | POA: Diagnosis not present

## 2014-05-19 DIAGNOSIS — R05 Cough: Secondary | ICD-10-CM | POA: Diagnosis not present

## 2014-06-18 ENCOUNTER — Telehealth: Payer: Self-pay | Admitting: Cardiovascular Disease

## 2014-06-18 NOTE — Telephone Encounter (Signed)
Patient is at oral surgeon for implant. Is it okay for them to proceed? Please call and advise

## 2014-06-18 NOTE — Telephone Encounter (Signed)
Returned call to Clarise Cruz with Dr.Stroud's office left message to call back.

## 2014-06-18 NOTE — Telephone Encounter (Signed)
Received call from Clarise Cruz at [facility] office Dr.Nishan out of office this week.Spoke to Truitt Merle NP patient ok to have dental implant.Stated patient held [place].Advised start xarelto back after dental implant.

## 2014-06-23 ENCOUNTER — Telehealth: Payer: Self-pay | Admitting: Hematology and Oncology

## 2014-06-23 NOTE — Telephone Encounter (Signed)
pt called re KK and wanted to confirm lb/fu. pt given new lb/fu appt w/[facility]G for 10/14.

## 2014-07-04 ENCOUNTER — Ambulatory Visit: Payer: Medicare Other | Admitting: Oncology

## 2014-07-04 ENCOUNTER — Other Ambulatory Visit: Payer: Medicare Other

## 2014-07-28 DIAGNOSIS — H9209 Otalgia, unspecified ear: Secondary | ICD-10-CM | POA: Diagnosis not present

## 2014-08-03 ENCOUNTER — Encounter: Payer: Self-pay | Admitting: *Deleted

## 2014-08-05 ENCOUNTER — Other Ambulatory Visit: Payer: Self-pay | Admitting: *Deleted

## 2014-08-05 DIAGNOSIS — I6523 Occlusion and stenosis of bilateral carotid arteries: Secondary | ICD-10-CM

## 2014-08-07 ENCOUNTER — Other Ambulatory Visit: Payer: Self-pay | Admitting: *Deleted

## 2014-08-07 MED ORDER — CARVEDILOL 6.25 MG PO TABS: 6.2500 mg | ORAL_TABLET | Freq: Two times a day (BID) | ORAL | Status: DC

## 2014-09-11 ENCOUNTER — Other Ambulatory Visit: Payer: Self-pay

## 2014-09-11 DIAGNOSIS — I5032 Chronic diastolic (congestive) heart failure: Secondary | ICD-10-CM

## 2014-09-11 DIAGNOSIS — I10 Essential (primary) hypertension: Secondary | ICD-10-CM

## 2014-09-11 MED ORDER — FUROSEMIDE 20 MG PO TABS
20.0000 mg | ORAL_TABLET | Freq: Every day | ORAL | Status: DC
Start: 2014-09-11 — End: 2014-09-30

## 2014-09-16 ENCOUNTER — Telehealth: Payer: Self-pay | Admitting: *Deleted

## 2014-09-16 NOTE — Telephone Encounter (Signed)
Signed order faxed back to [facility]. Original sent to [facility] to be scanned.

## 2014-09-18 ENCOUNTER — Other Ambulatory Visit: Payer: Self-pay

## 2014-09-18 DIAGNOSIS — M7062 Trochanteric bursitis, left hip: Secondary | ICD-10-CM | POA: Diagnosis not present

## 2014-09-18 DIAGNOSIS — M25552 Pain in left hip: Secondary | ICD-10-CM | POA: Diagnosis not present

## 2014-09-18 MED ORDER — DOFETILIDE 250 MCG PO CAPS: 250.0000 ug | ORAL_CAPSULE | Freq: Two times a day (BID) | ORAL | Status: DC

## 2014-09-22 ENCOUNTER — Telehealth: Payer: Self-pay | Admitting: Hematology and Oncology

## 2014-09-22 NOTE — Telephone Encounter (Signed)
pt called today to confirm appt d/t - per pt unaware of when appts is. appts were confirmed w/pt on 06/23/14 and again today for lb/VG 10/14.

## 2014-09-25 ENCOUNTER — Ambulatory Visit (HOSPITAL_COMMUNITY): Payer: Medicare Other | Attending: Family Medicine | Admitting: Cardiology

## 2014-09-25 DIAGNOSIS — I10 Essential (primary) hypertension: Secondary | ICD-10-CM | POA: Insufficient documentation

## 2014-09-25 DIAGNOSIS — I4891 Unspecified atrial fibrillation: Secondary | ICD-10-CM | POA: Insufficient documentation

## 2014-09-25 DIAGNOSIS — I6523 Occlusion and stenosis of bilateral carotid arteries: Secondary | ICD-10-CM

## 2014-09-25 DIAGNOSIS — I6529 Occlusion and stenosis of unspecified carotid artery: Secondary | ICD-10-CM | POA: Diagnosis not present

## 2014-09-25 NOTE — Progress Notes (Signed)
Carotid duplex performed 

## 2014-09-30 ENCOUNTER — Other Ambulatory Visit: Payer: Self-pay | Admitting: *Deleted

## 2014-09-30 ENCOUNTER — Ambulatory Visit (INDEPENDENT_AMBULATORY_CARE_PROVIDER_SITE_OTHER): Payer: Medicare Other | Admitting: Cardiovascular Disease

## 2014-09-30 ENCOUNTER — Other Ambulatory Visit: Payer: Self-pay

## 2014-09-30 ENCOUNTER — Encounter: Payer: Self-pay | Admitting: Cardiovascular Disease

## 2014-09-30 VITALS — BP 118/66 | HR 82 | Ht 62.0 in | Wt 130.0 lb

## 2014-09-30 DIAGNOSIS — I169 Hypertensive crisis, unspecified: Secondary | ICD-10-CM

## 2014-09-30 DIAGNOSIS — I480 Paroxysmal atrial fibrillation: Secondary | ICD-10-CM | POA: Diagnosis not present

## 2014-09-30 DIAGNOSIS — I6529 Occlusion and stenosis of unspecified carotid artery: Secondary | ICD-10-CM

## 2014-09-30 DIAGNOSIS — I5032 Chronic diastolic (congestive) heart failure: Secondary | ICD-10-CM

## 2014-09-30 DIAGNOSIS — C50919 Malignant neoplasm of unspecified site of unspecified female breast: Secondary | ICD-10-CM

## 2014-09-30 DIAGNOSIS — I10 Essential (primary) hypertension: Secondary | ICD-10-CM

## 2014-09-30 DIAGNOSIS — I6522 Occlusion and stenosis of left carotid artery: Secondary | ICD-10-CM | POA: Diagnosis not present

## 2014-09-30 DIAGNOSIS — C50911 Malignant neoplasm of unspecified site of right female breast: Secondary | ICD-10-CM

## 2014-09-30 MED ORDER — FUROSEMIDE 20 MG PO TABS: 20.0000 mg | ORAL_TABLET | Freq: Every day | ORAL | Status: DC

## 2014-09-30 NOTE — Assessment & Plan Note (Signed)
Maint NSR on tikosyn should have BMET at oncology center this week

## 2014-09-30 NOTE — Assessment & Plan Note (Signed)
Well controlled.  Continue current medications and low sodium Dash type diet.    

## 2014-09-30 NOTE — Assessment & Plan Note (Signed)
Left ICA 60-79% stenosis.  No TIA symptoms.  Continue antiplatelet Rx and F/U carotid duplex in 6 months

## 2014-09-30 NOTE — Assessment & Plan Note (Signed)
Recent MRI ok has f/u this week did not have adjuvant chemo after first cancer and came back at margin of mastectomy

## 2014-09-30 NOTE — Progress Notes (Signed)
Patient ID: [name], female   DOB: [birth year], 68 y.o.   MRN: [id number] [name] is a 68 y.o. female who returns for f/u after a recent admission to the hospital.  She has a hx of nonischemic cardiomyopathy, HTN, RBBB, hypothyroidism, Hodgkin's disease, breast cancer and AFib. She underwent radiation therapy for Hodgkin's lymphoma in 1988. She has a hx of breast cancer status post mastectomy in 2009 and recurrent breast cancer in 2013. She has completed radiation, Taxotere and Cytoxan in 7/13. Patient was seen by cardiology during admission in 5/13 for neutropenia and fever as well as a/c diastolic CHF. She had tachycardia. It was suspected that her sinus tachycardia was driven by her fever. Flecainide was started in 11/2012 for recurrent PAFib. ETT was neg for pro-arrhythmia.  Marland Kitchen  She was admitted  5/22-05/23/13 . She was admitted with pulmonary edema in the setting of hypertensive crisis complicated by PEA arrest. She had ROSC after 7 minutes of CPR and ACLS. She was diuresed with IV Lasix. She developed aspiration pneumonia/pneumonitis (pneumococcal). She was treated with antibiotics. She did have difficulty with hypotension requiring adjustments in her medications.Patient did have initiation and amiodarone for atrial fibrillation. Her flecainide had been stopped and then restarted. She was ultimately started on dofetilide with restoration of NSR   Echo 05/10/13: EF 38-75%, grade 1 diastolic dysfunction, PASP 36, small effusion.  Carotid from 09/26/14  Left ICA 60-79% stenosis. No TIA symptoms.   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  All other systems reviewed and negative  General: Affect appropriate Healthy:  appears stated age 68:  normal Neck supple with no adenopathy JVP normal no bruits no thyromegaly Lungs clear with no wheezing and good diaphragmatic motion Heart:  S1/S2 no murmur, no rub, gallop or click previous right mastectomy  PMI normal Abdomen: benighn, BS positve, no tenderness, no AAA no bruit.  No HSM or HJR Distal pulses intact with no bruits No edema Neuro non-focal Skin warm and dry No muscular weakness   Current Outpatient Prescriptions  Medication Sig Dispense Refill  . calcium carbonate (OS-CAL) 600 MG TABS Take 600 mg by mouth 2 (two) times daily with a meal.       . carvedilol (COREG) 6.25 MG tablet Take 1 tablet (6.25 mg total) by mouth 2 (two) times daily with a meal.  60 tablet  1  . Cholecalciferol (VITAMIN D) 2000 UNITS CAPS Take by mouth.      . dofetilide (TIKOSYN) 250 MCG capsule Take 1 capsule (250 mcg total) by mouth every 12 (twelve) hours.  180 capsule  0  . furosemide (LASIX) 20 MG tablet Take 1 tablet (20 mg total) by mouth daily.  90 tablet  0  . levothyroxine (SYNTHROID, LEVOTHROID) 112 MCG tablet Take 112 mcg by mouth daily before breakfast.      . losartan (COZAAR) 25 MG tablet TAKE 1 TABLET BY MOUTH EVERY DAY  90 tablet  2  . magnesium oxide (MAG-OX 400) 400 MG tablet Take 400 mg by mouth 2 (two) times daily.       . Multiple Vitamin (MULTIVITAMIN) tablet Take 1 tablet by mouth daily. Natural multivitamin      . OVER THE COUNTER MEDICATION Take 1 tablet by mouth 2 (two) times daily. osteodenx supplement      .  OVER THE COUNTER MEDICATION Take 3 tablets by mouth daily at 12 noon. Plant omega green supplement      . OVER THE COUNTER MEDICATION Mental Clarity, daily      . Rivaroxaban (XARELTO) 20 MG TABS tablet Take 1 tablet (20 mg total) by mouth daily with supper.  90 tablet  3   No current facility-administered medications for this visit.    Allergies  Ace inhibitors; Benazepril; Contrast media; Epinephrine; Iohexol; Potassium-containing compounds; and  Taxotere  Electrocardiogram: 6/14  SR RBBB LAD    Today SR rate 82  Limb lead reversal IVCD   Assessment and Plan

## 2014-10-01 ENCOUNTER — Telehealth: Payer: Self-pay | Admitting: Hematology and Oncology

## 2014-10-01 ENCOUNTER — Ambulatory Visit (HOSPITAL_BASED_OUTPATIENT_CLINIC_OR_DEPARTMENT_OTHER): Payer: Medicare Other | Admitting: Hematology and Oncology

## 2014-10-01 ENCOUNTER — Other Ambulatory Visit (HOSPITAL_BASED_OUTPATIENT_CLINIC_OR_DEPARTMENT_OTHER): Payer: Medicare Other

## 2014-10-01 ENCOUNTER — Telehealth: Payer: Self-pay | Admitting: Cardiovascular Disease

## 2014-10-01 VITALS — BP 113/55 | HR 84 | Temp 97.7°F | Resp 20 | Ht 62.0 in | Wt 129.8 lb

## 2014-10-01 DIAGNOSIS — C44501 Unspecified malignant neoplasm of skin of breast: Secondary | ICD-10-CM

## 2014-10-01 DIAGNOSIS — Z8572 Personal history of non-Hodgkin lymphomas: Secondary | ICD-10-CM

## 2014-10-01 DIAGNOSIS — Z171 Estrogen receptor negative status [ER-]: Secondary | ICD-10-CM | POA: Diagnosis not present

## 2014-10-01 DIAGNOSIS — I509 Heart failure, unspecified: Secondary | ICD-10-CM | POA: Diagnosis not present

## 2014-10-01 DIAGNOSIS — C50911 Malignant neoplasm of unspecified site of right female breast: Secondary | ICD-10-CM

## 2014-10-01 DIAGNOSIS — C50919 Malignant neoplasm of unspecified site of unspecified female breast: Secondary | ICD-10-CM

## 2014-10-01 LAB — COMPREHENSIVE METABOLIC PANEL (CC13)
ALBUMIN: 3.6 g/dL (ref 3.5–5.0)
ALK PHOS: 131 U/L (ref 40–150)
ALT: 17 U/L (ref 0–55)
AST: 15 U/L (ref 5–34)
Anion Gap: 10 mEq/L (ref 3–11)
BUN: 20.2 mg/dL (ref 7.0–26.0)
CHLORIDE: 103 meq/L (ref 98–109)
CO2: 29 meq/L (ref 22–29)
Calcium: 9.4 mg/dL (ref 8.4–10.4)
Creatinine: 0.8 mg/dL (ref 0.6–1.1)
GLUCOSE: 108 mg/dL (ref 70–140)
POTASSIUM: 4.2 meq/L (ref 3.5–5.1)
SODIUM: 141 meq/L (ref 136–145)
TOTAL PROTEIN: 6.7 g/dL (ref 6.4–8.3)
Total Bilirubin: 0.45 mg/dL (ref 0.20–1.20)

## 2014-10-01 LAB — CBC WITH DIFFERENTIAL/PLATELET
BASO%: 0.7 % (ref 0.0–2.0)
BASOS ABS: 0.1 10*3/uL (ref 0.0–0.1)
EOS ABS: 0.1 10*3/uL (ref 0.0–0.5)
EOS%: 0.9 % (ref 0.0–7.0)
HCT: 42.4 % (ref 34.8–46.6)
HEMOGLOBIN: 14 g/dL (ref 11.6–15.9)
LYMPH#: 1.6 10*3/uL (ref 0.9–3.3)
LYMPH%: 12.4 % — ABNORMAL LOW (ref 14.0–49.7)
MCH: 30.6 pg (ref 25.1–34.0)
MCHC: 33 g/dL (ref 31.5–36.0)
MCV: 92.6 fL (ref 79.5–101.0)
MONO#: 1.3 10*3/uL — AB (ref 0.1–0.9)
MONO%: 10.2 % (ref 0.0–14.0)
NEUT%: 75.8 % (ref 38.4–76.8)
NEUTROS ABS: 9.9 10*3/uL — AB (ref 1.5–6.5)
Platelets: 222 10*3/uL (ref 145–400)
RBC: 4.58 10*6/uL (ref 3.70–5.45)
RDW: 14.1 % (ref 11.2–14.5)
WBC: 13.1 10*3/uL — AB (ref 3.9–10.3)
nRBC: 0 % (ref 0–0)

## 2014-10-01 NOTE — Assessment & Plan Note (Signed)
Recurrent right breast triple negative cancer: Originally diagnosed with Hodgkin lymphoma in 1987 treated with mantle radiation. He developed breast cancer in 2009 underwent right breast mastectomy for a T1 B. N0 M0 stage IA triple negative breast cancer. In 2013, patient developed chest wall recurrence that was resected. She is currently being followed by mammograms every year and breast MRIs every other year.  Surveillance: Breast MRI is done April 2015 revealed normal findings. She had a normal physical exam of the breast as well as normal mammogram.  Postsplenectomy vaccinations: I provided her with brochure regarding all the vaccines that she will need it she will take it to her primary care physician and discuss what she had received what she needs to receive. Patient had episode of pneumonia requiring ventilator support in May 2014. It may also have been complicated by congestive heart failure.

## 2014-10-01 NOTE — Telephone Encounter (Signed)
Pt st that she had labs this AM at the [facility] and Dr. Johnsie Cancel wanted to know potassium. K=4.2 Labs are uploaded into EPIC.

## 2014-10-01 NOTE — Telephone Encounter (Signed)
Labs look good.

## 2014-10-01 NOTE — Telephone Encounter (Signed)
New problem   Pt potassium is 4.2. Per pt calling.

## 2014-10-01 NOTE — Progress Notes (Signed)
Patient Care Team: [name], MD as PCP - General  DIAGNOSIS: Relapsed triple negative breast cancer  CHIEF COMPLIANT: Annual followup of breast cancer  INTERVAL HISTORY: [name] is a 68 year old Caucasian with above-mentioned history of right-sided breast cancer originally diagnosed in 2009. She was underwent mastectomy but decided not to do chemotherapy even though it was suggested and offered at that time. She then developed a chest wall relapse in 2013. She had surgery followed by 4 cycles of adjuvant chemotherapy with Taxotere and Cytoxan. She has been followed through surveillance evaluations and so far there has not been any evidence of disease recurrence. May 2014 she was admitted to the hospital with pneumonia with congestive heart failure. She gets MRIs every other year and mammograms every year.  REVIEW OF SYSTEMS:   Constitutional: Denies fevers, chills or abnormal weight loss Eyes: Denies blurriness of vision Ears, nose, mouth, throat, and face: Denies mucositis or sore throat Respiratory: Denies cough, dyspnea or wheezes Cardiovascular: Denies palpitation, chest discomfort or lower extremity swelling Gastrointestinal:  Denies nausea, heartburn or change in bowel habits Skin: Denies abnormal skin rashes Lymphatics: Denies new lymphadenopathy or easy bruising Neurological:Denies numbness, tingling or new weaknesses Behavioral/Psych: Mood is stable, no new changes  Breast:  denies any pain or lumps or nodules in either breasts All other systems were reviewed with the patient and are negative.  I have reviewed the past medical history, past surgical history, social history and family history with the patient and they are unchanged from previous note.  ALLERGIES:  is allergic to ace inhibitors; benazepril; contrast media; epinephrine; iohexol; potassium-containing compounds; and taxotere.  MEDICATIONS:  Current Outpatient Prescriptions  Medication Sig Dispense  Refill  . calcium carbonate (OS-CAL) 600 MG TABS Take 600 mg by mouth 2 (two) times daily with a meal.       . carvedilol (COREG) 6.25 MG tablet Take 1 tablet (6.25 mg total) by mouth 2 (two) times daily with a meal.  60 tablet  1  . Cholecalciferol (VITAMIN D) 2000 UNITS CAPS Take by mouth.      . dofetilide (TIKOSYN) 250 MCG capsule Take 1 capsule (250 mcg total) by mouth every 12 (twelve) hours.  180 capsule  0  . furosemide (LASIX) 20 MG tablet Take 1 tablet (20 mg total) by mouth daily.  90 tablet  3  . levothyroxine (SYNTHROID, LEVOTHROID) 112 MCG tablet Take 112 mcg by mouth daily before breakfast.      . losartan (COZAAR) 25 MG tablet TAKE 1 TABLET BY MOUTH EVERY DAY  90 tablet  2  . magnesium oxide (MAG-OX 400) 400 MG tablet Take 400 mg by mouth 2 (two) times daily.       . Multiple Vitamin (MULTIVITAMIN) tablet Take 1 tablet by mouth daily. Natural multivitamin      . OVER THE COUNTER MEDICATION Take 1 tablet by mouth 2 (two) times daily. osteodenx supplement      . OVER THE COUNTER MEDICATION Take 3 tablets by mouth daily at 12 noon. Plant omega green supplement      . OVER THE COUNTER MEDICATION Mental Clarity, daily      . Rivaroxaban (XARELTO) 20 MG TABS tablet Take 1 tablet (20 mg total) by mouth daily with supper.  90 tablet  3   No current facility-administered medications for this visit.    PHYSICAL EXAMINATION: ECOG PERFORMANCE STATUS: 0 - Asymptomatic  Filed Vitals:   10/01/14 1030  BP: 113/55  Pulse: 84  Temp: 97.7  F (36.5 C)  Resp: 20   Filed Weights   10/01/14 1030  Weight: 129 lb 12.8 oz (58.877 kg)    GENERAL:alert, no distress and comfortable SKIN: skin color, texture, turgor are normal, no rashes or significant lesions EYES: normal, Conjunctiva are pink and non-injected, sclera clear OROPHARYNX:no exudate, no erythema and lips, buccal mucosa, and tongue normal  NECK: supple, thyroid normal size, non-tender, without nodularity LYMPH:  no palpable  lymphadenopathy in the cervical, axillary or inguinal LUNGS: clear to auscultation and percussion with normal breathing effort HEART: regular rate & rhythm and no murmurs and no lower extremity edema ABDOMEN:abdomen soft, non-tender and normal bowel sounds Musculoskeletal:no cyanosis of digits and no clubbing  NEURO: alert & oriented x 3 with fluent speech, no focal motor/sensory deficits  LABORATORY DATA:  I have reviewed the data as listed   Chemistry      Component Value Date/Time   NA 141 10/01/2014 1011   NA 136 09/04/2013 1517   K 4.2 10/01/2014 1011   K 4.1 09/04/2013 1517   CL 104 09/04/2013 1517   CL 104 09/05/2012 1438   CO2 29 10/01/2014 1011   CO2 27 09/04/2013 1517   BUN 20.2 10/01/2014 1011   BUN 28* 09/04/2013 1517   CREATININE 0.8 10/01/2014 1011   CREATININE 0.8 09/04/2013 1517      Component Value Date/Time   CALCIUM 9.4 10/01/2014 1011   CALCIUM 9.5 09/04/2013 1517   ALKPHOS 131 10/01/2014 1011   ALKPHOS 142* 05/09/2013 1648   AST 15 10/01/2014 1011   AST 26 05/09/2013 1648   ALT 17 10/01/2014 1011   ALT 18 05/09/2013 1648   BILITOT 0.45 10/01/2014 1011   BILITOT 0.3 05/09/2013 1648       Lab Results  Component Value Date   WBC 13.1* 10/01/2014   HGB 14.0 10/01/2014   HCT 42.4 10/01/2014   MCV 92.6 10/01/2014   PLT 222 10/01/2014   NEUTROABS 9.9* 10/01/2014     RADIOGRAPHIC STUDIES: I have personally reviewed the radiology reports and agreed with their findings. MRI done April 2015 was reviewed and was normal.   ASSESSMENT & PLAN:   Cancer of Breast T1bN0M0 triple neg S/P mastectomy/reconstruction 04/2008 Recurrent right breast triple negative cancer: Originally diagnosed with Hodgkin lymphoma in 1987 treated with mantle radiation. He developed breast cancer in 2009 underwent right breast mastectomy for a T1 B. N0 M0 stage IA triple negative breast cancer. In 2013, patient developed chest wall recurrence that was resected. She is currently being followed  by mammograms every year and breast MRIs every other year.  Surveillance: Breast MRI is done April 2015 revealed normal findings. She had a normal physical exam of the breast as well as normal mammogram.  Postsplenectomy vaccinations: I provided her with brochure regarding all the vaccines that she will need it she will take it to her primary care physician and discuss what she had received what she needs to receive. Patient had episode of pneumonia requiring ventilator support in May 2014. It may also have been complicated by congestive heart failure.  Hodgkin's lymphoma: Diagnosed 27 years ago and is in remission after mantle radiation   Orders Placed This Encounter  Procedures  . CBC with Differential    Standing Status: Future     Number of Occurrences:      Standing Expiration Date: 10/01/2015  . Comprehensive metabolic panel (Cmet) - [facility]    Standing Status: Future     Number of Occurrences:  Standing Expiration Date: 10/01/2015   The patient has a good understanding of the overall plan. she agrees with it. She will call with any problems that may develop before her next visit here.  I spent 20 minutes counseling the patient face to face. The total time spent in the appointment was 25 minutes and more than 50% was on counseling and review of test results    Rulon Eisenmenger, MD 10/01/2014 11:38 AM

## 2014-10-01 NOTE — Telephone Encounter (Signed)
per pof to sch pt appt-gave pt copy of sch °

## 2014-10-03 ENCOUNTER — Other Ambulatory Visit: Payer: Self-pay

## 2014-10-03 MED ORDER — CARVEDILOL 6.25 MG PO TABS: 6.2500 mg | ORAL_TABLET | Freq: Two times a day (BID) | ORAL | Status: DC

## 2014-10-03 NOTE — Telephone Encounter (Signed)
PT  AWARE   OF  CAROTID  RESULTS  AND THAT  DR Johnsie Cancel  IS  PLEASED WITH   K VALUE./CY

## 2014-10-03 NOTE — Telephone Encounter (Signed)
Patient is returning call for lab results, please call and advise.

## 2014-10-06 DIAGNOSIS — Z23 Encounter for immunization: Secondary | ICD-10-CM | POA: Diagnosis not present

## 2014-10-16 DIAGNOSIS — Z23 Encounter for immunization: Secondary | ICD-10-CM | POA: Diagnosis not present

## 2014-10-30 ENCOUNTER — Other Ambulatory Visit: Payer: Self-pay | Admitting: Cardiovascular Disease

## 2014-11-28 ENCOUNTER — Encounter: Payer: Self-pay | Admitting: Cardiovascular Disease

## 2014-11-28 DIAGNOSIS — E039 Hypothyroidism, unspecified: Secondary | ICD-10-CM | POA: Diagnosis not present

## 2014-11-28 DIAGNOSIS — E780 Pure hypercholesterolemia: Secondary | ICD-10-CM | POA: Diagnosis not present

## 2014-11-28 DIAGNOSIS — M8580 Other specified disorders of bone density and structure, unspecified site: Secondary | ICD-10-CM | POA: Diagnosis not present

## 2014-11-28 DIAGNOSIS — Z1389 Encounter for screening for other disorder: Secondary | ICD-10-CM | POA: Diagnosis not present

## 2014-11-28 DIAGNOSIS — I4891 Unspecified atrial fibrillation: Secondary | ICD-10-CM | POA: Diagnosis not present

## 2014-11-28 DIAGNOSIS — I429 Cardiomyopathy, unspecified: Secondary | ICD-10-CM | POA: Diagnosis not present

## 2014-12-11 ENCOUNTER — Other Ambulatory Visit: Payer: Self-pay | Admitting: Cardiovascular Disease

## 2014-12-24 DIAGNOSIS — D224 Melanocytic nevi of scalp and neck: Secondary | ICD-10-CM | POA: Diagnosis not present

## 2014-12-24 DIAGNOSIS — L918 Other hypertrophic disorders of the skin: Secondary | ICD-10-CM | POA: Diagnosis not present

## 2014-12-24 DIAGNOSIS — L821 Other seborrheic keratosis: Secondary | ICD-10-CM | POA: Diagnosis not present

## 2014-12-24 DIAGNOSIS — D2261 Melanocytic nevi of right upper limb, including shoulder: Secondary | ICD-10-CM | POA: Diagnosis not present

## 2014-12-24 DIAGNOSIS — D2262 Melanocytic nevi of left upper limb, including shoulder: Secondary | ICD-10-CM | POA: Diagnosis not present

## 2014-12-24 DIAGNOSIS — D225 Melanocytic nevi of trunk: Secondary | ICD-10-CM | POA: Diagnosis not present

## 2015-01-01 ENCOUNTER — Other Ambulatory Visit: Payer: Self-pay | Admitting: Cardiovascular Disease

## 2015-01-02 ENCOUNTER — Other Ambulatory Visit: Payer: Self-pay | Admitting: *Deleted

## 2015-01-02 MED ORDER — LOSARTAN POTASSIUM 25 MG PO TABS: 25.0000 mg | ORAL_TABLET | Freq: Every day | ORAL | Status: DC

## 2015-02-07 DIAGNOSIS — M7062 Trochanteric bursitis, left hip: Secondary | ICD-10-CM | POA: Diagnosis not present

## 2015-02-16 DIAGNOSIS — M7062 Trochanteric bursitis, left hip: Secondary | ICD-10-CM | POA: Diagnosis not present

## 2015-02-20 DIAGNOSIS — M7062 Trochanteric bursitis, left hip: Secondary | ICD-10-CM | POA: Diagnosis not present

## 2015-02-23 DIAGNOSIS — M7062 Trochanteric bursitis, left hip: Secondary | ICD-10-CM | POA: Diagnosis not present

## 2015-02-27 DIAGNOSIS — M7062 Trochanteric bursitis, left hip: Secondary | ICD-10-CM | POA: Diagnosis not present

## 2015-03-02 ENCOUNTER — Ambulatory Visit (INDEPENDENT_AMBULATORY_CARE_PROVIDER_SITE_OTHER): Payer: Medicare Other | Admitting: Cardiovascular Disease

## 2015-03-02 ENCOUNTER — Encounter: Payer: Self-pay | Admitting: Cardiovascular Disease

## 2015-03-02 VITALS — BP 140/78 | HR 80 | Ht 62.0 in | Wt 135.8 lb

## 2015-03-02 DIAGNOSIS — I10 Essential (primary) hypertension: Secondary | ICD-10-CM

## 2015-03-02 DIAGNOSIS — R0989 Other specified symptoms and signs involving the circulatory and respiratory systems: Secondary | ICD-10-CM

## 2015-03-02 DIAGNOSIS — I6522 Occlusion and stenosis of left carotid artery: Secondary | ICD-10-CM

## 2015-03-02 DIAGNOSIS — I480 Paroxysmal atrial fibrillation: Secondary | ICD-10-CM

## 2015-03-02 NOTE — Assessment & Plan Note (Signed)
Stable no high grade AV block or syncope

## 2015-03-02 NOTE — Assessment & Plan Note (Signed)
Left ICA 60-79% stenosis.  No TIA symptoms.  Continue antiplatelet Rx and F/U carotid duplex in April

## 2015-03-02 NOTE — Assessment & Plan Note (Signed)
Well controlled.  Continue current medications and low sodium Dash type diet.    

## 2015-03-02 NOTE — Patient Instructions (Signed)
Your physician wants you to follow-up in:    [place] will receive a reminder letter in the mail two months in advance. If you don't receive a letter, please call our office to schedule the follow-up appointment.  Your physician recommends that you continue on your current medications as directed. Please refer to the Current Medication list given to you today.   Your physician has requested that you have a carotid duplex. This test is an ultrasound of the carotid arteries in your neck. It looks at blood flow through these arteries that supply the brain with blood. Allow one hour for this exam. There are no restrictions or special instructions.

## 2015-03-02 NOTE — Assessment & Plan Note (Signed)
No palpitations continue tikosyn and xarelto

## 2015-03-02 NOTE — Progress Notes (Signed)
Patient ID: [name], female   DOB: [birth year], 69 y.o.   MRN: [id number] [name] is a 69 y.o.  female who returns for f/u DCM  She has a hx of nonischemic cardiomyopathy, HTN, RBBB, hypothyroidism, Hodgkin's disease, breast cancer and AFib. She underwent radiation therapy for Hodgkin's lymphoma in 1988. She has a hx of breast cancer status post mastectomy in 2009 and recurrent breast cancer in 2013. She has completed radiation, Taxotere and Cytoxan in 7/13. Patient was seen by [place]  during admission in 5/13 for neutropenia and fever as well as a/c diastolic CHF. She had tachycardia. It was suspected that her sinus tachycardia was driven by her fever. Flecainide was started in 12/13/12 for recurrent PAF. ETT was neg for pro-arrhythmia.  Marland Kitchen  She was admitted  5/22-05/23/13 . She was admitted with pulmonary edema in the setting of hypertensive crisis complicated by PEA arrest. She had ROSC after 7 minutes of CPR and ACLS. Thought due to aspiration and respitory distress  She was diuresed with IV Lasix. She developed aspiration pneumonia/pneumonitis (pneumococcal). She was treated with antibiotics. She did have difficulty with hypotension requiring adjustments in her medications.Patient did have initiation and amiodarone for atrial fibrillation. Her flecainide had been stopped and then restarted. She was ultimately started on dofetilide with restoration of NSR   Echo 05/10/13: EF 63-01%, grade 1 diastolic dysfunction, PASP 36, small effusion.  Carotid from 09/26/14  Left ICA 60-79% stenosis. No TIA symptoms.   Log cabin in [place] not same without husband who died in 12/14/2023 Has daughter in [place]: Denies fever, malais, weight loss, blurry vision, decreased visual acuity, cough, sputum, SOB, hemoptysis, pleuritic pain, palpitaitons, heartburn, abdominal pain, melena, lower extremity edema, claudication, or rash.  All other systems reviewed and negative  General: Affect  appropriate Healthy:  appears stated age 69: normal Neck supple with no adenopathy JVP normal no bruits no thyromegaly Lungs clear with no wheezing and good diaphragmatic motion Heart:  S1/S2 no murmur, no rub, gallop or click previous right mastectomy  PMI normal Abdomen: benighn, BS positve, no tenderness, no AAA no bruit.  No HSM or HJR Distal pulses intact with no bruits No edema Neuro non-focal Skin warm and dry No muscular weakness   Current Outpatient Prescriptions  Medication Sig Dispense Refill  . calcium carbonate (OS-CAL) 600 MG TABS Take 600 mg by mouth 2 (two) times daily with a meal.     . carvedilol (COREG) 6.25 MG tablet Take 1 tablet (6.25 mg total) by mouth 2 (two) times daily with a meal. 180 tablet 3  . Cholecalciferol (VITAMIN D) 2000 UNITS CAPS Take by mouth.    . furosemide (LASIX) 20 MG tablet Take 1 tablet (20 mg total) by mouth daily. 90 tablet 3  . levothyroxine (SYNTHROID, LEVOTHROID) 112 MCG tablet Take 112 mcg by mouth daily before breakfast.    . losartan (COZAAR) 25 MG tablet Take 1 tablet (25 mg total) by mouth daily. 90 tablet 3  . magnesium oxide (MAG-OX 400) 400 MG tablet Take 400 mg by mouth 2 (two) times daily.     . Multiple Vitamin (MULTIVITAMIN) tablet Take 1 tablet by mouth daily. Natural multivitamin    . OVER THE COUNTER MEDICATION Take 1 tablet by mouth 2 (two) times daily. osteodenx supplement    . OVER THE COUNTER MEDICATION Take 3 tablets by mouth daily at 12 noon. Plant omega green supplement    . OVER THE COUNTER  MEDICATION Take 1 packet by mouth daily. Tianchi herba    . Rivaroxaban (XARELTO) 20 MG TABS tablet Take 1 tablet (20 mg total) by mouth daily with supper. 90 tablet 3  . TIKOSYN 250 MCG capsule TAKE 1 CAPSULE (250 MCG TOTAL) BY MOUTH EVERY 12 (TWELVE) HOURS. 180 capsule 0   No current facility-administered medications for this visit.    Allergies  Ace inhibitors; Benazepril; Contrast media; Epinephrine; Iohexol;  Potassium-containing compounds; and Taxotere  Electrocardiogram: 6/14  SR RBBB LAD    Today SR rate 82  Limb lead reversal IVCD   Assessment and Plan

## 2015-03-03 DIAGNOSIS — M7062 Trochanteric bursitis, left hip: Secondary | ICD-10-CM | POA: Diagnosis not present

## 2015-03-06 DIAGNOSIS — M7062 Trochanteric bursitis, left hip: Secondary | ICD-10-CM | POA: Diagnosis not present

## 2015-03-09 DIAGNOSIS — M7062 Trochanteric bursitis, left hip: Secondary | ICD-10-CM | POA: Diagnosis not present

## 2015-03-12 ENCOUNTER — Other Ambulatory Visit: Payer: Self-pay | Admitting: Cardiovascular Disease

## 2015-03-12 DIAGNOSIS — M7062 Trochanteric bursitis, left hip: Secondary | ICD-10-CM | POA: Diagnosis not present

## 2015-03-13 ENCOUNTER — Telehealth: Payer: Self-pay | Admitting: Cardiovascular Disease

## 2015-03-13 NOTE — Telephone Encounter (Signed)
New Message  Pt wanted to speak w/ Rn about Request for Tier Exception- for Tikosin. Per pt- Medicare stated request will arrive on Monday or Tuesday 3/8 or 3/29. Pt wanted to notify our office since she will run out on Thu 3/31. Please call back and discuss.

## 2015-03-13 NOTE — Telephone Encounter (Signed)
LMTCB

## 2015-03-16 NOTE — Telephone Encounter (Signed)
Follow up      Pt said her ins company will not pay for her tikosyn.  We will be getting a fax today to have the doctor fill it out.  She is returning a nurses call from [place]

## 2015-03-16 NOTE — Telephone Encounter (Signed)
Pt is aware we received her message.  She is reporting a 3 month supply will be $1,317 and of course she can not afford this.  [facility] is going to be faxing a tier exception request for [place] to complete so that she will be able to afford it.

## 2015-03-16 NOTE — Telephone Encounter (Signed)
Will be looking for the fax

## 2015-03-16 NOTE — Telephone Encounter (Signed)
Received exception form and completed it.  Faxed to [facility] this afternoon.

## 2015-03-18 DIAGNOSIS — Z1289 Encounter for screening for malignant neoplasm of other sites: Secondary | ICD-10-CM | POA: Diagnosis not present

## 2015-03-18 DIAGNOSIS — Z1231 Encounter for screening mammogram for malignant neoplasm of breast: Secondary | ICD-10-CM | POA: Diagnosis not present

## 2015-03-24 ENCOUNTER — Telehealth: Payer: Self-pay | Admitting: Cardiovascular Disease

## 2015-03-24 ENCOUNTER — Ambulatory Visit (HOSPITAL_COMMUNITY): Payer: Medicare Other | Attending: Cardiovascular Disease | Admitting: Cardiology

## 2015-03-24 DIAGNOSIS — R0989 Other specified symptoms and signs involving the circulatory and respiratory systems: Secondary | ICD-10-CM

## 2015-03-24 DIAGNOSIS — I4891 Unspecified atrial fibrillation: Secondary | ICD-10-CM | POA: Insufficient documentation

## 2015-03-24 DIAGNOSIS — I2510 Atherosclerotic heart disease of native coronary artery without angina pectoris: Secondary | ICD-10-CM | POA: Insufficient documentation

## 2015-03-24 DIAGNOSIS — I6523 Occlusion and stenosis of bilateral carotid arteries: Secondary | ICD-10-CM

## 2015-03-24 DIAGNOSIS — I10 Essential (primary) hypertension: Secondary | ICD-10-CM | POA: Diagnosis not present

## 2015-03-24 NOTE — Progress Notes (Signed)
Carotid duplex performed 

## 2015-03-24 NOTE — Telephone Encounter (Signed)
Walk -in pat Form questions about [name] back Wednesday will give to her then

## 2015-03-26 ENCOUNTER — Telehealth: Payer: Self-pay | Admitting: *Deleted

## 2015-03-26 NOTE — Telephone Encounter (Signed)
SPOKE WITH  PT  RE TIKOSYN WOULD LIKE NEXT SCRIPT  SENT  TO [facility]  IN  3 MO  WILL BE  CHEAPER  AT  526.00 VERSES  680.[facility] AT  [facility]  PT  WILL BACK WHEN GETS CLOSER  AND LEAVE MESSAGE  RE THE  NEED FOR NEW  SCRIPT  PT  AWARE ALSO THAT PRIOR  AUTH WAS  DONE  WILL SEE  WHAT  TRANSPIRES WITH THIS  REQUEST .Adonis Housekeeper

## 2015-04-06 DIAGNOSIS — R05 Cough: Secondary | ICD-10-CM | POA: Diagnosis not present

## 2015-04-06 DIAGNOSIS — B349 Viral infection, unspecified: Secondary | ICD-10-CM | POA: Diagnosis not present

## 2015-04-08 ENCOUNTER — Telehealth: Payer: Self-pay | Admitting: *Deleted

## 2015-04-08 NOTE — Telephone Encounter (Signed)
S/w Harmon Pier from [facility] @ [phone] Tikosyn (250 mcg) q 12 hr is approved [id number].

## 2015-04-17 ENCOUNTER — Ambulatory Visit
Admission: RE | Admit: 2015-04-17 | Discharge: 2015-04-17 | Disposition: A | Discharge status: Home / Self Care | Payer: Medicare Other | Source: Ambulatory Visit | Attending: Family Medicine | Admitting: Family Medicine

## 2015-04-17 ENCOUNTER — Other Ambulatory Visit: Payer: Self-pay | Admitting: Family Medicine

## 2015-04-17 DIAGNOSIS — R0989 Other specified symptoms and signs involving the circulatory and respiratory systems: Secondary | ICD-10-CM

## 2015-04-17 DIAGNOSIS — R05 Cough: Secondary | ICD-10-CM

## 2015-04-17 DIAGNOSIS — R059 Cough, unspecified: Secondary | ICD-10-CM

## 2015-04-17 DIAGNOSIS — J9801 Acute bronchospasm: Secondary | ICD-10-CM | POA: Diagnosis not present

## 2015-06-03 DIAGNOSIS — H40013 Open angle with borderline findings, low risk, bilateral: Secondary | ICD-10-CM | POA: Diagnosis not present

## 2015-06-03 DIAGNOSIS — H2513 Age-related nuclear cataract, bilateral: Secondary | ICD-10-CM | POA: Diagnosis not present

## 2015-07-17 ENCOUNTER — Other Ambulatory Visit: Payer: Self-pay | Admitting: Cardiovascular Disease

## 2015-07-25 DIAGNOSIS — J0300 Acute streptococcal tonsillitis, unspecified: Secondary | ICD-10-CM | POA: Diagnosis not present

## 2015-07-25 DIAGNOSIS — J029 Acute pharyngitis, unspecified: Secondary | ICD-10-CM | POA: Diagnosis not present

## 2015-07-28 DIAGNOSIS — H9203 Otalgia, bilateral: Secondary | ICD-10-CM | POA: Diagnosis not present

## 2015-08-17 ENCOUNTER — Telehealth: Payer: Self-pay | Admitting: Cardiovascular Disease

## 2015-08-17 NOTE — Telephone Encounter (Signed)
New message      Pt had strep throat on august 4th.  Since then she has had 4 episodes of arrhythmia causing her to lie down for a few minutes----then it went away.  Is it possible that the strep throat caused her arrythmia problem?

## 2015-08-17 NOTE — Telephone Encounter (Signed)
LM TO CALL BACK ./CY 

## 2015-08-18 NOTE — Telephone Encounter (Addendum)
CONTINUES TO COMPLAIN OF  PALPITATIONS  PER  PT   WITH HAVING STREP THROAT PMD  PUT PT ON PREDNISONE  FOR  3 DAYS  SYMPTOMS   MAY BE  COMING FROM   MED   WILL CONTINUE TO MONITOR  AND  WILL CALL BACK  IF NO IMPROVEMENT  HAS  APPT   NEXT  WEEK WITH DR Johnsie Cancel FOR   F/U  WILL FORWARD TO DR Johnsie Cancel FOR  REVIEW .Adonis Housekeeper

## 2015-08-18 NOTE — Telephone Encounter (Signed)
Prednisone will cause palpitations

## 2015-08-18 NOTE — Telephone Encounter (Signed)
NOTED ./CY 

## 2015-08-24 NOTE — Progress Notes (Signed)
Patient ID: [name], female   DOB: Apr 28, 1946, 69 y.o.   MRN: [id number] [name] is a 69 y.o.  female who returns for f/u DCM  She has a hx of nonischemic cardiomyopathy, HTN, RBBB, hypothyroidism, Hodgkin's disease, breast cancer and AFib. She underwent radiation therapy for Hodgkin's lymphoma in 1988. She has a hx of breast cancer status post mastectomy in 2009 and recurrent breast cancer in 2013. She has completed radiation, Taxotere and Cytoxan in 7/13. Patient was seen by [place]  during admission in 5/13 for neutropenia and fever as well as a/c diastolic CHF. She had tachycardia. It was suspected that her sinus tachycardia was driven by her fever. Flecainide was started in 26-Nov-2012 for recurrent PAF. ETT was neg for pro-arrhythmia.  [name]  She was admitted  5/22-05/23/13 . She was admitted with pulmonary edema in the setting of hypertensive crisis complicated by PEA arrest. She had ROSC after 7 minutes of CPR and ACLS. Thought due to aspiration and respitory distress  She was diuresed with IV Lasix. She developed aspiration pneumonia/pneumonitis (pneumococcal). She was treated with antibiotics. She did have difficulty with hypotension requiring adjustments in her medications.Patient did have initiation and amiodarone for atrial fibrillation. Her flecainide had been stopped and then restarted. She was ultimately started on dofetilide with restoration of NSR   Echo 05/10/13: EF 61-47%, grade 1 diastolic dysfunction, PASP 36, small effusion.  Carotid from 09/26/14  Left ICA 60-79% stenosis. No TIA symptoms.   Log cabin in [place] not same without husband who died in Nov 27, 2023 Has daughter in [place]   Recently placed on prednisone for strep throat and having more palpitations in August  03/25/15  Left ICA 60-79% stenosis.  No TIA symptoms.  Continue antiplatelet Rx and F/U carotid duplex September 2016  Now    ROS: Denies fever, malais, weight loss, blurry vision, decreased visual acuity,  cough, sputum, SOB, hemoptysis, pleuritic pain, palpitaitons, heartburn, abdominal pain, melena, lower extremity edema, claudication, or rash.  All other systems reviewed and negative  General: Affect appropriate Healthy:  appears stated age 53: normal Neck supple with no adenopathy JVP normal left  bruits no thyromegaly Lungs clear with no wheezing and good diaphragmatic motion Heart:  S1/S2 no murmur, no rub, gallop or click previous right mastectomy  PMI normal Abdomen: benighn, BS positve, no tenderness, no AAA no bruit.  No HSM or HJR Distal pulses intact with no bruits No edema Neuro non-focal Skin warm and dry No muscular weakness   Current Outpatient Prescriptions  Medication Sig Dispense Refill  . calcium carbonate (OS-CAL) 600 MG TABS Take 600 mg by mouth 2 (two) times daily with a meal.     . carvedilol (COREG) 6.25 MG tablet Take 1 tablet (6.25 mg total) by mouth 2 (two) times daily with a meal. 180 tablet 3  . Cholecalciferol (VITAMIN D) 2000 UNITS CAPS Take 1 capsule by mouth daily.     . furosemide (LASIX) 20 MG tablet Take 1 tablet (20 mg total) by mouth daily. 90 tablet 3  . levothyroxine (SYNTHROID, LEVOTHROID) 112 MCG tablet Take 112 mcg by mouth daily before breakfast.    . losartan (COZAAR) 25 MG tablet Take 1 tablet (25 mg total) by mouth daily. 90 tablet 3  . magnesium oxide (MAG-OX 400) 400 MG tablet Take 400 mg by mouth 2 (two) times daily.     . Multiple Vitamin (MULTIVITAMIN) tablet Take 1 tablet by mouth daily. Natural multivitamin    . OVER  THE COUNTER MEDICATION Take 1 tablet by mouth 2 (two) times daily. osteodenx supplement    . OVER THE COUNTER MEDICATION Take 3 tablets by mouth daily at 12 noon. Plant omega green supplement    . OVER THE COUNTER MEDICATION Take 2 tablets by mouth daily. MENTAL CLARITY    . TIKOSYN 250 MCG capsule TAKE 1 CAPSULE (250 MCG TOTAL) BY MOUTH EVERY 12 (TWELVE) HOURS. 180 capsule 1  . XARELTO 20 MG TABS tablet TAKE 1  TABLET BY MOUTH DAILY WITH SUPPER 90 tablet 3   No current facility-administered medications for this visit.    Allergies  Ace inhibitors; Benazepril; Contrast media; Epinephrine; Iohexol; Potassium-containing compounds; and Taxotere  Electrocardiogram: 6/14  SR RBBB LAD    03/02/15  SR rate 82  Limb lead reversal IVCD   08/25/15  SR rate 75  LAE LAD IVCD   Assessment and Plan Diastolic CHF: stable BP low due to recent illness hold lasix and ARB  Labs including CBC/BMET and ESR PAF: NSR ECG with QT 440 on low dose.  Some PAF with recent steroids but stable now HTN: BP low hold diuretic and ARB Thyroid:  Check TSH   Carotid:  F/u duplex known 40-59% bilateral stenosis  Strep:  Echo to r/o SBE given continued symptoms of malaise, fatigue and PAF  F/U 2-3 weeks with me or PA

## 2015-08-25 ENCOUNTER — Encounter: Payer: Self-pay | Admitting: Cardiovascular Disease

## 2015-08-26 ENCOUNTER — Ambulatory Visit (INDEPENDENT_AMBULATORY_CARE_PROVIDER_SITE_OTHER): Payer: Medicare Other | Admitting: Cardiovascular Disease

## 2015-08-26 ENCOUNTER — Encounter: Payer: Self-pay | Admitting: Cardiovascular Disease

## 2015-08-26 VITALS — BP 90/60 | HR 75 | Ht 62.0 in | Wt 138.8 lb

## 2015-08-26 DIAGNOSIS — I509 Heart failure, unspecified: Secondary | ICD-10-CM

## 2015-08-26 DIAGNOSIS — R5383 Other fatigue: Secondary | ICD-10-CM

## 2015-08-26 DIAGNOSIS — I6523 Occlusion and stenosis of bilateral carotid arteries: Secondary | ICD-10-CM | POA: Diagnosis not present

## 2015-08-26 LAB — BASIC METABOLIC PANEL
BUN: 23 mg/dL (ref 6–23)
CO2: 28 meq/L (ref 19–32)
CREATININE: 0.81 mg/dL (ref 0.40–1.20)
Calcium: 9.3 mg/dL (ref 8.4–10.5)
Chloride: 103 mEq/L (ref 96–112)
GFR: 74.32 mL/min (ref >60.00)
GLUCOSE: 91 mg/dL (ref 70–99)
Potassium: 4.2 mEq/L (ref 3.5–5.1)
Sodium: 140 mEq/L (ref 135–145)

## 2015-08-26 LAB — CBC WITH DIFFERENTIAL/PLATELET
BASOS PCT: 1.9 % (ref 0.0–3.0)
Basophils Absolute: 0.1 10*3/uL (ref 0.0–0.1)
EOS PCT: 4.5 % (ref 0.0–5.0)
Eosinophils Absolute: 0.3 10*3/uL (ref 0.0–0.7)
HCT: 41.6 % (ref 36.0–46.0)
Hemoglobin: 13.6 g/dL (ref 12.0–15.0)
LYMPHS ABS: 1.6 10*3/uL (ref 0.7–4.0)
Lymphocytes Relative: 24.1 % (ref 12.0–46.0)
MCHC: 32.8 g/dL (ref 30.0–36.0)
MCV: 93.7 fl (ref 78.0–100.0)
MONOS PCT: 9.8 % (ref 3.0–12.0)
Monocytes Absolute: 0.6 10*3/uL (ref 0.1–1.0)
Neutro Abs: 3.9 10*3/uL (ref 1.4–7.7)
Neutrophils Relative %: 59.7 % (ref 43.0–77.0)
Platelets: 349 10*3/uL (ref 150.0–400.0)
RBC: 4.44 Mil/uL (ref 3.87–5.11)
RDW: 14.6 % (ref 11.5–15.5)
WBC: 6.6 10*3/uL (ref 4.0–10.5)

## 2015-08-26 LAB — SEDIMENTATION RATE: Sed Rate: 16 mm/hr (ref 0–22)

## 2015-08-26 LAB — TSH: TSH: 0.31 u[IU]/mL — ABNORMAL LOW (ref 0.35–4.50)

## 2015-08-26 NOTE — Patient Instructions (Signed)
Medication Instructions:  HOLD  LOSARTAN  AND   FUROSEMIDE   Labwork: TODAY  CBC ,BMET, SED RATE ,AND  TSH   Testing/Procedures: Your physician has requested that you have an echocardiogram. Echocardiography is a painless test that uses sound waves to create images of your heart. It provides your doctor with information about the size and shape of your heart and how well your heart's chambers and valves are working. This procedure takes approximately one hour. There are no restrictions for this procedure. Your physician has requested that you have a carotid duplex. This test is an ultrasound of the carotid arteries in your neck. It looks at blood flow through these arteries that supply the brain with blood. Allow one hour for this exam. There are no restrictions or special instructions.    Follow-Up: Your physician recommends that you schedule a follow-up appointment in: 2-3  WEEKS  WITH  PA  AND   3 MONTHS WITH  DR Johnsie Cancel   Any Other Special Instructions Will Be Listed Below (If Applicable).

## 2015-08-27 ENCOUNTER — Telehealth: Payer: Self-pay | Admitting: Cardiovascular Disease

## 2015-08-27 ENCOUNTER — Telehealth: Payer: Self-pay | Admitting: *Deleted

## 2015-08-27 NOTE — Telephone Encounter (Signed)
SEE OTHER PHONE NOTE PT AWARE OF LAB RESULTS./CY

## 2015-08-27 NOTE — Telephone Encounter (Signed)
TSH low call primary synthroid should probably be lowered        ----- Message -----     From: Richmond Campbell, LPN     Sent: 12/24/567  1:55 PM      To: Josue Hector, MD, Richmond Campbell, LPN        LM TO Rod Can .Adonis Housekeeper

## 2015-08-27 NOTE — Telephone Encounter (Signed)
New message     Pt returning your call

## 2015-09-03 ENCOUNTER — Ambulatory Visit (HOSPITAL_COMMUNITY)
Admission: RE | Admit: 2015-09-03 | Discharge: 2015-09-03 | Disposition: A | Discharge status: Home / Self Care | Payer: Medicare Other | Source: Ambulatory Visit | Attending: Cardiovascular Disease | Admitting: Cardiovascular Disease

## 2015-09-03 ENCOUNTER — Ambulatory Visit (HOSPITAL_BASED_OUTPATIENT_CLINIC_OR_DEPARTMENT_OTHER): Payer: Medicare Other

## 2015-09-03 ENCOUNTER — Other Ambulatory Visit: Payer: Self-pay

## 2015-09-03 DIAGNOSIS — I340 Nonrheumatic mitral (valve) insufficiency: Secondary | ICD-10-CM | POA: Diagnosis not present

## 2015-09-03 DIAGNOSIS — I509 Heart failure, unspecified: Secondary | ICD-10-CM | POA: Insufficient documentation

## 2015-09-03 DIAGNOSIS — I313 Pericardial effusion (noninflammatory): Secondary | ICD-10-CM | POA: Insufficient documentation

## 2015-09-03 DIAGNOSIS — I6523 Occlusion and stenosis of bilateral carotid arteries: Secondary | ICD-10-CM

## 2015-09-03 DIAGNOSIS — I351 Nonrheumatic aortic (valve) insufficiency: Secondary | ICD-10-CM | POA: Diagnosis not present

## 2015-09-15 DIAGNOSIS — H938X2 Other specified disorders of left ear: Secondary | ICD-10-CM | POA: Diagnosis not present

## 2015-09-15 NOTE — Progress Notes (Signed)
Cardiology Office Note   Date:  09/16/2015   ID:  Dhruti, Ghuman 1946-11-19, MRN [id number]  PCP:  [name], MD  Cardiologist:  Dr. Jenkins Rouge   Electrophysiologist:  n/a  Chief Complaint  Patient presents with  . Follow-up  . Congestive Heart Failure     History of Present Illness: [name] is a 69 y.o. female with a hx of NICM with improved EF, diastolic HF, HTN, LBBB, hypothyroidism, Hodgkin's disease, breast CA, PAF, carotid stenosis. Patient underwent radiation therapy for Hodgkin's lymphoma in 1998, mastectomy for breast cancer in 2009 and recurrent breast CA in 2013. EF has been as low as 45% in the past. This is improved to normal over time. Flecainide was started in 12/13 for recurrent A. fib. ETT was negative for proarrhythmia. She was admitted in 5/14 with pulmonary edema in the setting of hypertensive crisis complicated by PEA arrest. She was switched to dofetilide.  Last seen by Dr. Johnsie Cancel 08/26/15. She had a recent bout of strep throat and was placed on prednisone. Her blood pressure was running low and her Lasix and ARB were placed on hold. Follow-up echo demonstrated normal LV function with moderate MR. Labs indicated low TSH. Follow-up with primary care was recommended.  She returns for follow-up.  Doing well.  The patient denies chest pain, shortness of breath, syncope, orthopnea, PND or significant pedal edema. No further palpitations.  BP and weight started to increase and she resumed Losartan and Lasix.  BP has been fine since.    Studies/Reports Reviewed Today:  Echo 09/03/15 EF 50-55%, apical anteroseptal HK, trivial AI, MAC, moderate MR, PASP 32 mmHg, trivial effusion  Carotid US 4/16 R 40-59%; L 60-79%  ETT 12/13 normal - no evidence of ischemia by ST analysis  LHC 5/05 LAD 30%   Past Medical History  Diagnosis Date  . Atrial fibrillation     dx in 2008;  CHADS2-VASc=5;  changed to Tikosyn during admx 05/2013; Xarelto started  05/2013  . LBBB (left bundle branch block)   . NICM (nonischemic cardiomyopathy)     a. EF as low as 45%;  b. LHC 5/05:  LM 10%, pLAD 30%, EF 45-50%,   c. Echo 5/13:  EF 60-65%, Gr 1 diast dysfn, MAC;   d.  Echo 11/13:  EF 60-65%, Gr 1 diast dysfn, Tr AI, mild MR;  d.  Echo 05/10/13: EF 06-23%, grade 1 diastolic dysfunction, PASP 36, small effusion.  Marland Kitchen HTN (hypertension)   . Hypothyroidism     2/2 XRT for Hodgkins Lymphoma  . Raynaud's disease   . Anemia   . Blood transfusion     1968  . Arthritis     fingers   . Hodgkin's disease     stage 2b;  s/p XRT 1988  . Breast cancer 2009    T1N0M0 DCIS right breast  . Breast cancer 2013    a. reocurrence right breast;  b. s/p Taxotere, Cytoxan and XRT  . Status post radiation therapy 1988    mantle and periaortic   . Mucositis 04/29/2012  . History of radiation therapy 07/10/12-08/13/12    right breast/chest wall  . Carotid stenosis     a. dopplers 7/62:  RICA 8-31%, LICA 51-76% => repeat due in 01/2013;  b.  Carotid US (1/60): RICA 7-37%; LICA 10-62% - f/u 6 mos  . PEA (Pulseless electrical activity)     a. admx 05/2013 for APE in setting of HTN emergency c/b PEA  arrest and asp pneumonia, VDRF, AFib with RVR    Past Surgical History  Procedure Laterality Date  . Tonsillectomy and adenoidectomy  1965  . Thoractomy  1968  . Btl  1971  . R vein ligation & stripping  1976  . Spleenectomy  1988    for Hodgkin's disease  . Breast lumpectomy  2009    right, lymph node biopsy  . Mastectomy  2009    right breast  . Polypectomy  1990    D&C (also in 1977 and [place])   . Dilation and curettage of uterus    . Tissue expander placement      right breast  . Tissue expander  removal w/ replacement of implant    . Mass excision  03/28/2012    Procedure: EXCISION MASS;  Surgeon: Edward Jolly, MD;  Location: [facility];  Service: General;  Laterality: Right;  Excision right chest wall mass  . Portacath placement  04/16/2012     Procedure: INSERTION PORT-A-CATH;  Surgeon: Edward Jolly, MD;  Location: [facility];  Service: General;  Laterality: Left;  Placement of Port-a-Cath, left subclavian  . Port-a-cath removal  10/03/2012    Procedure: REMOVAL PORT-A-CATH;  Surgeon: Edward Jolly, MD;  Location: [facility];  Service: General;  Laterality: N/A;     Current Outpatient Prescriptions  Medication Sig Dispense Refill  . calcium carbonate (OS-CAL) 600 MG TABS Take 600 mg by mouth 2 (two) times daily with a meal.     . carvedilol (COREG) 6.25 MG tablet Take 1 tablet (6.25 mg total) by mouth 2 (two) times daily with a meal. 180 tablet 3  . Cholecalciferol (VITAMIN D) 2000 UNITS CAPS Take 1 capsule by mouth daily.     . furosemide (LASIX) 20 MG tablet Take 1 tablet (20 mg total) by mouth daily. 90 tablet 3  . levothyroxine (SYNTHROID, LEVOTHROID) 100 MCG tablet Take 100 mcg by mouth daily before breakfast.    . losartan (COZAAR) 25 MG tablet Take 1 tablet (25 mg total) by mouth daily. 90 tablet 3  . magnesium oxide (MAG-OX 400) 400 MG tablet Take 400 mg by mouth 2 (two) times daily.     . Multiple Vitamin (MULTIVITAMIN) tablet Take 1 tablet by mouth daily. Natural multivitamin    . OVER THE COUNTER MEDICATION Take 1 tablet by mouth 2 (two) times daily. osteodenx supplement    . OVER THE COUNTER MEDICATION Take 3 tablets by mouth daily at 12 noon. Plant omega green supplement    . OVER THE COUNTER MEDICATION Take 2 tablets by mouth daily. MENTAL CLARITY    . Probiotic CAPS Take 1 capsule by mouth daily. PATIENT TAKES 1 100 MG CAPSULE BY MOUTH DAILY    . TIKOSYN 250 MCG capsule TAKE 1 CAPSULE (250 MCG TOTAL) BY MOUTH EVERY 12 (TWELVE) HOURS. 180 capsule 1  . XARELTO 20 MG TABS tablet TAKE 1 TABLET BY MOUTH DAILY WITH SUPPER 90 tablet 3   No current facility-administered medications for this visit.    Allergies:   Ace inhibitors; Benazepril; Contrast media; Epinephrine; Iohexol; Potassium-containing compounds;  Prednisolone; and Taxotere    Social History:  The patient  reports that she has never smoked. She has never used smokeless tobacco. She reports that she does not drink alcohol or use illicit drugs.   Family History:  The patient's family history includes Hypertension in her mother. There is no history of Cancer.    ROS:   Please see  the history of present illness.   Review of Systems  All other systems reviewed and are negative.     PHYSICAL EXAM: VS:  BP 112/68 mmHg  Pulse 83  Ht '5\' 2"'$  (1.575 m)  Wt 137 lb 12.8 oz (62.506 kg)  BMI 25.20 kg/m2  LMP 03/08/1995    Wt Readings from Last 3 Encounters:  09/16/15 137 lb 12.8 oz (62.506 kg)  08/26/15 138 lb 12.8 oz (62.959 kg)  03/02/15 135 lb 12.8 oz (61.598 kg)     GEN: Well nourished, well developed, in no acute distress HEENT: normal Neck: no JVD,   no masses Cardiac:  Normal S1/S2, RRR; no murmur ,  no rubs or gallops, no edema   Respiratory:  clear to auscultation bilaterally, no wheezing, rhonchi or rales. GI: soft, nontender, nondistended, + BS MS: no deformity or atrophy Skin: warm and dry  Neuro:  CNs II-XII intact, Strength and sensation are intact Psych: Normal affect   EKG:  EKG is not ordered today.  It demonstrates:   n/a   Recent Labs: 10/01/2014: ALT 17 08/26/2015: BUN 23; Creatinine, Ser 0.81; Hemoglobin 13.6; Platelets 349.0; Potassium 4.2; Sodium 140; TSH 0.31*    Lipid Panel    Component Value Date/Time   CHOL 163 05/18/2013 0440   TRIG 175* 05/18/2013 0440   HDL 26* 05/18/2013 0440   CHOLHDL 6.3 05/18/2013 0440   VLDL 35 05/18/2013 0440   LDLCALC 102* 05/18/2013 0440      ASSESSMENT AND PLAN:  1. Chronic Diastolic CHF:  Volume stable.  She is back on beta-blocker, angiotensin receptor blocker, Lasix.  2. PAF:  No recurrent palpitations.  Continue Xarelto, Tikosyn.    3. HTN:  Controlled.   4. Hypothyroidism: FU with PCP.    5. Carotid stenosis:  FU planned in 10/16.       Medication Changes: Current medicines are reviewed at length with the patient today.  Concerns regarding medicines are as outlined above.  The following changes have been made:   Discontinued Medications   LEVOTHYROXINE (SYNTHROID, LEVOTHROID) 112 MCG TABLET    Take 112 mcg by mouth daily before breakfast.   Modified Medications   No medications on file   New Prescriptions   No medications on file    Labs/ tests ordered today include:   No orders of the defined types were placed in this encounter.      Disposition:    FU with Dr. Jenkins Rouge 6 mos.    Signed, [name], MHS 09/16/2015 9:14 AM    [facility] [address], [place], [place]  [address] Phone: [phone]; Fax: [phone]

## 2015-09-16 ENCOUNTER — Encounter: Payer: Self-pay | Admitting: Physician Assistant

## 2015-09-16 ENCOUNTER — Ambulatory Visit (INDEPENDENT_AMBULATORY_CARE_PROVIDER_SITE_OTHER): Payer: Medicare Other | Admitting: Physician Assistant

## 2015-09-16 VITALS — BP 112/68 | HR 83 | Ht 62.0 in | Wt 137.8 lb

## 2015-09-16 DIAGNOSIS — I480 Paroxysmal atrial fibrillation: Secondary | ICD-10-CM | POA: Diagnosis not present

## 2015-09-16 DIAGNOSIS — I5032 Chronic diastolic (congestive) heart failure: Secondary | ICD-10-CM | POA: Diagnosis not present

## 2015-09-16 DIAGNOSIS — I10 Essential (primary) hypertension: Secondary | ICD-10-CM | POA: Diagnosis not present

## 2015-09-16 DIAGNOSIS — I6523 Occlusion and stenosis of bilateral carotid arteries: Secondary | ICD-10-CM | POA: Diagnosis not present

## 2015-09-16 NOTE — Patient Instructions (Signed)
Medication Instructions:  Your physician recommends that you continue on your current medications as directed. Please refer to the Current Medication list given to you today.   Labwork: NONE  Testing/Procedures: NONE  Follow-Up: Your physician wants you to follow-up in: 6 MONTHS  WITH  DR NISHAN  You will receive a reminder letter in the mail two months in advance. If you don't receive a letter, please call our office to schedule the follow-up appointment.  Any Other Special Instructions Will Be Listed Below (If Applicable).   

## 2015-09-18 ENCOUNTER — Other Ambulatory Visit: Payer: Self-pay | Admitting: *Deleted

## 2015-09-18 MED ORDER — DOFETILIDE 250 MCG PO CAPS: ORAL_CAPSULE | ORAL | Status: DC

## 2015-10-05 ENCOUNTER — Other Ambulatory Visit: Payer: Self-pay | Admitting: Cardiovascular Disease

## 2015-10-07 ENCOUNTER — Other Ambulatory Visit: Payer: Self-pay

## 2015-10-07 DIAGNOSIS — C50911 Malignant neoplasm of unspecified site of right female breast: Secondary | ICD-10-CM

## 2015-10-07 NOTE — Assessment & Plan Note (Signed)
Recurrent right breast triple negative cancer: Originally diagnosed with Hodgkin lymphoma in 1987 treated with mantle radiation. He developed breast cancer in 2009 underwent right breast mastectomy for a T1 B. N0 M0 stage IA triple negative breast cancer. In 2013, patient developed chest wall recurrence that was resected. She is currently being followed by mammograms every year and breast MRIs every other year.  Breast Cancer Surveillance: 1. Breast exam 10/08/15: Normal 2. Mammogram  No abnormalities. Postsurgical changes. Breast Density Category. I recommended that she get 3-D mammograms for surveillance. Discussed the differences between different breast density categories.  Postsplenectomy.  Hodgkin's lymphoma: Diagnosed 27 years ago and is in remission after mantle radiation

## 2015-10-08 ENCOUNTER — Other Ambulatory Visit (HOSPITAL_BASED_OUTPATIENT_CLINIC_OR_DEPARTMENT_OTHER): Payer: Medicare Other

## 2015-10-08 ENCOUNTER — Ambulatory Visit (HOSPITAL_BASED_OUTPATIENT_CLINIC_OR_DEPARTMENT_OTHER): Payer: Medicare Other | Admitting: Hematology and Oncology

## 2015-10-08 ENCOUNTER — Telehealth: Payer: Self-pay | Admitting: Hematology and Oncology

## 2015-10-08 ENCOUNTER — Encounter: Payer: Self-pay | Admitting: Hematology and Oncology

## 2015-10-08 VITALS — BP 96/49 | HR 83 | Temp 99.0°F | Resp 18 | Ht 62.0 in | Wt 138.7 lb

## 2015-10-08 DIAGNOSIS — Z8572 Personal history of non-Hodgkin lymphomas: Secondary | ICD-10-CM | POA: Diagnosis not present

## 2015-10-08 DIAGNOSIS — C50911 Malignant neoplasm of unspecified site of right female breast: Secondary | ICD-10-CM

## 2015-10-08 DIAGNOSIS — Z853 Personal history of malignant neoplasm of breast: Secondary | ICD-10-CM

## 2015-10-08 LAB — COMPREHENSIVE METABOLIC PANEL (CC13)
ALBUMIN: 3.7 g/dL (ref 3.5–5.0)
ALK PHOS: 108 U/L (ref 40–150)
ALT: 16 U/L (ref 0–55)
AST: 17 U/L (ref 5–34)
Anion Gap: 6 mEq/L (ref 3–11)
BUN: 19.8 mg/dL (ref 7.0–26.0)
CALCIUM: 9.7 mg/dL (ref 8.4–10.4)
CO2: 31 mEq/L — ABNORMAL HIGH (ref 22–29)
Chloride: 105 mEq/L (ref 98–109)
Creatinine: 0.8 mg/dL (ref 0.6–1.1)
EGFR: 76 mL/min/{1.73_m2} — AB (ref >90)
GLUCOSE: 99 mg/dL (ref 70–140)
POTASSIUM: 4.8 meq/L (ref 3.5–5.1)
Sodium: 143 mEq/L (ref 136–145)
Total Bilirubin: 0.39 mg/dL (ref 0.20–1.20)
Total Protein: 6.5 g/dL (ref 6.4–8.3)

## 2015-10-08 LAB — CBC WITH DIFFERENTIAL/PLATELET
BASO%: 1.7 % (ref 0.0–2.0)
BASOS ABS: 0.1 10*3/uL (ref 0.0–0.1)
EOS ABS: 0.3 10*3/uL (ref 0.0–0.5)
EOS%: 4.3 % (ref 0.0–7.0)
HEMATOCRIT: 40 % (ref 34.8–46.6)
HEMOGLOBIN: 13 g/dL (ref 11.6–15.9)
LYMPH#: 1.4 10*3/uL (ref 0.9–3.3)
LYMPH%: 20.4 % (ref 14.0–49.7)
MCH: 30.6 pg (ref 25.1–34.0)
MCHC: 32.5 g/dL (ref 31.5–36.0)
MCV: 94.1 fL (ref 79.5–101.0)
MONO#: 0.7 10*3/uL (ref 0.1–0.9)
MONO%: 10.1 % (ref 0.0–14.0)
NEUT#: 4.4 10*3/uL (ref 1.5–6.5)
NEUT%: 63.5 % (ref 38.4–76.8)
Platelets: 337 10*3/uL (ref 145–400)
RBC: 4.25 10*6/uL (ref 3.70–5.45)
RDW: 14.3 % (ref 11.2–14.5)
WBC: 7 10*3/uL (ref 3.9–10.3)

## 2015-10-08 NOTE — Progress Notes (Signed)
Patient Care Team: [name], MD as PCP - General  DIAGNOSIS: Relapsed triple negative breast cancer 2009, 2013  CHIEF COMPLIANT: Annual followup of breast cancer  INTERVAL HISTORY: [name] is a  69 year old with above-mentioned history of relapsed right breast triple negative breast cancer and long-standing history of Hodgkin's lymphoma treated with mantle radiation. She is here for annual follow-up and reports to be doing quite well. She had a mammogram in March 2016 with Dr. Melinda Crutch at their office. It was apparently normal. She denies any lumps or nodules in any discomfort in the breast.  REVIEW OF SYSTEMS:   Constitutional: Denies fevers, chills or abnormal weight loss Eyes: Denies blurriness of vision Ears, nose, mouth, throat, and face: Denies mucositis or sore throat Respiratory: Denies cough, dyspnea or wheezes Cardiovascular: Denies palpitation, chest discomfort or lower extremity swelling Gastrointestinal:  Denies nausea, heartburn or change in bowel habits Skin: Denies abnormal skin rashes Lymphatics: Denies new lymphadenopathy or easy bruising Neurological:Denies numbness, tingling or new weaknesses Behavioral/Psych: Mood is stable, no new changes  Breast:  denies any pain or lumps or nodules in either breasts All other systems were reviewed with the patient and are negative.  I have reviewed the past medical history, past surgical history, social history and family history with the patient and they are unchanged from previous note.  ALLERGIES:  is allergic to ace inhibitors; benazepril; contrast media; epinephrine; iohexol; potassium-containing compounds; prednisolone; and taxotere.  MEDICATIONS:  Current Outpatient Prescriptions  Medication Sig Dispense Refill  . calcium carbonate (OS-CAL) 600 MG TABS Take 600 mg by mouth 2 (two) times daily with a meal.     . carvedilol (COREG) 6.25 MG tablet TAKE 1 TABLET BY MOUTH TWICE DAILY WITH FOOD 180 tablet 3  .  Cholecalciferol (VITAMIN D) 2000 UNITS CAPS Take 1 capsule by mouth daily.     Marland Kitchen dofetilide (TIKOSYN) 250 MCG capsule TAKE 1 CAPSULE (250 MCG TOTAL) BY MOUTH EVERY 12 (TWELVE) HOURS. 180 capsule 1  . furosemide (LASIX) 20 MG tablet Take 1 tablet (20 mg total) by mouth daily. 90 tablet 3  . levothyroxine (SYNTHROID, LEVOTHROID) 100 MCG tablet Take 100 mcg by mouth daily before breakfast.    . losartan (COZAAR) 25 MG tablet Take 1 tablet (25 mg total) by mouth daily. 90 tablet 3  . magnesium oxide (MAG-OX 400) 400 MG tablet Take 400 mg by mouth 2 (two) times daily.     . Multiple Vitamin (MULTIVITAMIN) tablet Take 1 tablet by mouth daily. Natural multivitamin    . OVER THE COUNTER MEDICATION Take 1 tablet by mouth 2 (two) times daily. osteodenx supplement    . OVER THE COUNTER MEDICATION Take 3 tablets by mouth daily at 12 noon. Plant omega green supplement    . OVER THE COUNTER MEDICATION Take 2 tablets by mouth daily. MENTAL CLARITY    . Probiotic CAPS Take 1 capsule by mouth daily. PATIENT TAKES 1 100 MG CAPSULE BY MOUTH DAILY    . XARELTO 20 MG TABS tablet TAKE 1 TABLET BY MOUTH DAILY WITH SUPPER 90 tablet 3   No current facility-administered medications for this visit.    PHYSICAL EXAMINATION: ECOG PERFORMANCE STATUS: 1 - Symptomatic but completely ambulatory  Filed Vitals:   10/08/15 1017  BP: 96/49  Pulse: 83  Temp: 99 F (37.2 C)  Resp: 18   Filed Weights   10/08/15 1017  Weight: 138 lb 11.2 oz (62.914 kg)    GENERAL:alert, no distress and comfortable SKIN:  skin color, texture, turgor are normal, no rashes or significant lesions EYES: normal, Conjunctiva are pink and non-injected, sclera clear OROPHARYNX:no exudate, no erythema and lips, buccal mucosa, and tongue normal  NECK: supple, thyroid normal size, non-tender, without nodularity LYMPH:  no palpable lymphadenopathy in the cervical, axillary or inguinal LUNGS: clear to auscultation and percussion with normal breathing  effort HEART: regular rate & rhythm and no murmurs and no lower extremity edema ABDOMEN:abdomen soft, non-tender and normal bowel sounds Musculoskeletal:no cyanosis of digits and no clubbing  NEURO: alert & oriented x 3 with fluent speech, no focal motor/sensory deficits  breast exam: no palpable lumps or nodules in the right axilla. Left breast and axilla are normal. LABORATORY DATA:  I have reviewed the data as listed   Chemistry      Component Value Date/Time   NA 143 10/08/2015 1003   NA 140 08/26/2015 0943   K 4.8 10/08/2015 1003   K 4.2 08/26/2015 0943   CL 103 08/26/2015 0943   CL 104 09/05/2012 1438   CO2 31* 10/08/2015 1003   CO2 28 08/26/2015 0943   BUN 19.8 10/08/2015 1003   BUN 23 08/26/2015 0943   CREATININE 0.8 10/08/2015 1003   CREATININE 0.81 08/26/2015 0943      Component Value Date/Time   CALCIUM 9.7 10/08/2015 1003   CALCIUM 9.3 08/26/2015 0943   ALKPHOS 108 10/08/2015 1003   ALKPHOS 142* 05/09/2013 1648   AST 17 10/08/2015 1003   AST 26 05/09/2013 1648   ALT 16 10/08/2015 1003   ALT 18 05/09/2013 1648   BILITOT 0.39 10/08/2015 1003   BILITOT 0.3 05/09/2013 1648       Lab Results  Component Value Date   WBC 7.0 10/08/2015   HGB 13.0 10/08/2015   HCT 40.0 10/08/2015   MCV 94.1 10/08/2015   PLT 337 10/08/2015   NEUTROABS 4.4 10/08/2015   ASSESSMENT & PLAN:   Cancer of Breast T1bN0M0 triple neg S/P mastectomy/reconstruction 04/2008 Recurrent right breast triple negative cancer: Originally diagnosed with Hodgkin lymphoma in 1987 treated with mantle radiation. He developed breast cancer in 2009 underwent right breast mastectomy for a T1 B. N0 M0 stage IA triple negative breast cancer. In 2013, patient developed chest wall recurrence that was resected. She is currently being followed by mammograms every year and breast MRIs every other year.  Breast Cancer Surveillance: 1. Breast exam 10/08/15: Normal 2. Mammogram  No abnormalities. Postsurgical  changes. Breast Density Category. I recommended that she get 3-D mammograms for surveillance. Discussed the differences between different breast density categories.  S/P Postsplenectomy.  Hodgkin's lymphoma: Diagnosed 27 years ago and is in remission after mantle radiation   patient's husband died earlier this year from lung cancer.  Return to clinic in 1 year for follow-up. No orders of the defined types were placed in this encounter.   The patient has a good understanding of the overall plan. she agrees with it. she will call with any problems that may develop before the next visit here.   Rulon Eisenmenger, MD 10/08/2015

## 2015-10-08 NOTE — Addendum Note (Signed)
Addended by: Prentiss Bells on: 10/08/2015 05:33 PM   Modules accepted: Medications

## 2015-10-08 NOTE — Telephone Encounter (Signed)
GAVE PATIENT AVS REPORT AND APPOINTMENT FOR October 2017.

## 2015-10-12 ENCOUNTER — Ambulatory Visit (HOSPITAL_COMMUNITY)
Admission: RE | Admit: 2015-10-12 | Discharge: 2015-10-12 | Disposition: A | Discharge status: Home / Self Care | Payer: Medicare Other | Source: Ambulatory Visit | Attending: Cardiovascular Disease | Admitting: Cardiovascular Disease

## 2015-10-12 DIAGNOSIS — I10 Essential (primary) hypertension: Secondary | ICD-10-CM | POA: Insufficient documentation

## 2015-10-12 DIAGNOSIS — I6523 Occlusion and stenosis of bilateral carotid arteries: Secondary | ICD-10-CM | POA: Insufficient documentation

## 2015-11-19 ENCOUNTER — Encounter: Payer: Self-pay | Admitting: *Deleted

## 2015-11-20 NOTE — Progress Notes (Signed)
Patient ID: [name], female   DOB: 1946-02-04, 69 y.o.   MRN: [id number]    Cardiology Office Note   Date:  11/25/2015   ID:  [name], [name] January 30, 1946, MRN [id number]  PCP:  Patricia Huh, MD  Cardiologist:  Dr. Jenkins Rouge   Electrophysiologist:  n/a  Chief Complaint  Patient presents with  . Hypertension  . Heart Problem    Carotid stenosis bilateral  . Medication Refill     History of Present Illness: [name] is a 69 y.o. female with a hx of NICM with improved EF, diastolic HF, HTN, LBBB, hypothyroidism, Hodgkin's disease, breast CA, PAF, carotid stenosis. Patient underwent radiation therapy for Hodgkin's lymphoma in 1998, mastectomy for breast cancer in 2009 and recurrent breast CA in 2013. EF has been as low as 45% in the past. This is improved to normal over time. Flecainide was started in 12/13 for recurrent A. fib. ETT was negative for proarrhythmia. She was admitted in 5/14 with pulmonary edema in the setting of hypertensive crisis complicated by PEA arrest. She was switched to dofetilide.  Last seen by me  08/26/15. She had a recent bout of strep throat and was placed on prednisone. Her blood pressure was running low and her Lasix and ARB were placed on hold. Follow-up echo demonstrated normal LV function with moderate MR. Labs indicated low TSH. Follow-up with primary care was recommended.  She returns for follow-up.  Doing well.  The patient denies chest pain, shortness of breath, syncope, orthopnea, PND or significant pedal edema. No further palpitations.  BP and weight started to increase and she resumed Losartan and Lasix.  BP has been fine since.    Studies/Reports Reviewed Today:  Echo 09/03/15 EF 50-55%, apical anteroseptal HK, trivial AI, MAC, moderate MR, PASP 32 mmHg, trivial effusion  Carotid US 4/16 R 40-59%; L 60-79%  ETT 12/13 normal - no evidence of ischemia by ST analysis  LHC 5/05 LAD 30%   Past Medical History  Diagnosis  Date  . Atrial fibrillation ([place])     dx in 2008;  CHADS2-VASc=5;  changed to Tikosyn during admx 05/2013; Xarelto started 05/2013  . LBBB (left bundle branch block)   . NICM (nonischemic cardiomyopathy) ([place])     a. EF as low as 45%;  b. LHC 5/05:  LM 10%, pLAD 30%, EF 45-50%,   c. Echo 5/13:  EF 60-65%, Gr 1 diast dysfn, MAC;   d.  Echo 11/13:  EF 60-65%, Gr 1 diast dysfn, Tr AI, mild MR;  d.  Echo 05/10/13: EF 42-68%, grade 1 diastolic dysfunction, PASP 36, small effusion.  Marland Kitchen HTN (hypertension)   . Hypothyroidism     2/2 XRT for Hodgkins Lymphoma  . Raynaud's disease   . Anemia   . Blood transfusion     1968  . Arthritis     fingers   . Hodgkin's disease     stage 2b;  s/p XRT 1988  . Breast cancer ([place]) 2009    T1N0M0 DCIS right breast  . Breast cancer ([place]) 2013    a. reocurrence right breast;  b. s/p Taxotere, Cytoxan and XRT  . Status post radiation therapy 1988    mantle and periaortic   . Mucositis 04/29/2012  . History of radiation therapy 07/10/12-08/13/12    right breast/chest wall  . Carotid stenosis     a. dopplers 3/41:  RICA 9-62%, LICA 22-97% => repeat due in 01/2013;  b.  Carotid US (  9/14): RICA 0-16%; LICA 01-09% - f/u 6 mos  . PEA (Pulseless electrical activity) ([place])     a. admx 05/2013 for APE in setting of HTN emergency c/b PEA arrest and asp pneumonia, VDRF, AFib with RVR    Past Surgical History  Procedure Laterality Date  . Tonsillectomy and adenoidectomy  1965  . Thoractomy  1968  . Btl  1971  . R vein ligation & stripping  1976  . Spleenectomy  1988    for Hodgkin's disease  . Breast lumpectomy  2009    right, lymph node biopsy  . Mastectomy  2009    right breast  . Polypectomy  1990    D&C (also in 1977 and [place])   . Dilation and curettage of uterus    . Tissue expander placement      right breast  . Tissue expander  removal w/ replacement of implant    . Mass excision  03/28/2012    Procedure: EXCISION MASS;  Surgeon: Edward Jolly, MD;   Location: [facility];  Service: General;  Laterality: Right;  Excision right chest wall mass  . Portacath placement  04/16/2012    Procedure: INSERTION PORT-A-CATH;  Surgeon: Edward Jolly, MD;  Location: [facility];  Service: General;  Laterality: Left;  Placement of Port-a-Cath, left subclavian  . Port-a-cath removal  10/03/2012    Procedure: REMOVAL PORT-A-CATH;  Surgeon: Edward Jolly, MD;  Location: [facility];  Service: General;  Laterality: N/A;     Current Outpatient Prescriptions  Medication Sig Dispense Refill  . calcium carbonate (OS-CAL) 600 MG TABS Take 600 mg by mouth 2 (two) times daily with a meal.     . carvedilol (COREG) 6.25 MG tablet TAKE 1 TABLET BY MOUTH TWICE DAILY WITH FOOD 180 tablet 3  . Cholecalciferol (VITAMIN D) 2000 UNITS CAPS Take 1 capsule by mouth daily.     Marland Kitchen dofetilide (TIKOSYN) 250 MCG capsule TAKE 1 CAPSULE (250 MCG TOTAL) BY MOUTH EVERY 12 (TWELVE) HOURS. 180 capsule 1  . Lactobacillus (ACIDOPHILUS PROBIOTIC) 100 MG CAPS Take 100 mg by mouth daily.    Marland Kitchen levothyroxine (SYNTHROID, LEVOTHROID) 100 MCG tablet Take 100 mcg by mouth daily before breakfast.    . losartan (COZAAR) 25 MG tablet Take 1 tablet (25 mg total) by mouth daily. 90 tablet 3  . magnesium oxide (MAG-OX 400) 400 MG tablet Take 400 mg by mouth 2 (two) times daily.     . Multiple Vitamin (MULTIVITAMIN) tablet Take 1 tablet by mouth daily. Natural multivitamin    . Multiple Vitamins-Minerals (EYE VITAMINS PO) Take 1 tablet by mouth daily.    Marland Kitchen OVER THE COUNTER MEDICATION Take 1 tablet by mouth 2 (two) times daily. osteodenx supplement    . OVER THE COUNTER MEDICATION Take 3 tablets by mouth daily at 12 noon. Plant omega green supplement    . OVER THE COUNTER MEDICATION Take 1 tablet by mouth daily. CLARITY    . XARELTO 20 MG TABS tablet TAKE 1 TABLET BY MOUTH DAILY WITH SUPPER 90 tablet 3  . furosemide (LASIX) 20 MG tablet Take 1 tablet (20 mg total) by mouth daily. 90 tablet  3   No current facility-administered medications for this visit.    Allergies:   Ace inhibitors; Benazepril; Contrast media; Epinephrine; Iohexol; Potassium-containing compounds; Prednisolone; and Taxotere    Social History:  The patient  reports that she has never smoked. She has never used smokeless tobacco. She reports that she  does not drink alcohol or use illicit drugs.   Family History:  The patient's family history includes Hypertension in her mother. There is no history of Cancer.    ROS:   Please see the history of present illness.   Review of Systems  All other systems reviewed and are negative.     PHYSICAL EXAM: VS:  BP 126/68 mmHg  Pulse 64  Ht '5\' 2"'$  (1.575 m)  LMP 03/08/1995    Wt Readings from Last 3 Encounters:  10/08/15 62.914 kg (138 lb 11.2 oz)  09/16/15 62.506 kg (137 lb 12.8 oz)  08/26/15 62.959 kg (138 lb 12.8 oz)     GEN: Well nourished, well developed, in no acute distress HEENT: normal Neck: no JVD,   no masses Cardiac:  Normal S1/S2, RRR; no murmur ,  no rubs or gallops, no edema   Respiratory:  clear to auscultation bilaterally, no wheezing, rhonchi or rales. GI: soft, nontender, nondistended, + BS MS: no deformity or atrophy Skin: warm and dry  Neuro:  CNs II-XII intact, Strength and sensation are intact Psych: Normal affect   EKG:  EKG is not ordered today.  It demonstrates:   n/a   Recent Labs: 08/26/2015: TSH 0.31* 10/08/2015: ALT 16; BUN 19.8; Creatinine 0.8; HGB 13.0; Platelets 337; Potassium 4.8; Sodium 143    Lipid Panel    Component Value Date/Time   CHOL 163 05/18/2013 0440   TRIG 175* 05/18/2013 0440   HDL 26* 05/18/2013 0440   CHOLHDL 6.3 05/18/2013 0440   VLDL 35 05/18/2013 0440   LDLCALC 102* 05/18/2013 0440      ASSESSMENT AND PLAN:  1. Chronic Diastolic CHF:  Volume stable.  She is back on beta-blocker, angiotensin receptor blocker, Lasix.  2. PAF:  No recurrent palpitations.  Continue Xarelto, Tikosyn.     3. HTN:  Controlled.   4. Hypothyroidism: FU with PCP.    5. Carotid stenosis:  FU planned in 03/2016  6. Breast Cancer in remission f/u oncology      Medication Changes: Current medicines are reviewed at length with the patient today.  Concerns regarding medicines are as outlined above.  The following changes have been made:   Discontinued Medications   LEVOTHYROXINE (SYNTHROID, LEVOTHROID) 112 MCG TABLET    Take 112 mcg by mouth daily before breakfast.   Modified Medications   Modified Medication Previous Medication   FUROSEMIDE (LASIX) 20 MG TABLET furosemide (LASIX) 20 MG tablet      Take 1 tablet (20 mg total) by mouth daily.    Take 1 tablet (20 mg total) by mouth daily.   New Prescriptions   No medications on file    Labs/ tests ordered today include:   No orders of the defined types were placed in this encounter.      Disposition:    FU with me 6 mos.    Jenkins Rouge

## 2015-11-24 ENCOUNTER — Encounter: Payer: Self-pay | Admitting: Cardiovascular Disease

## 2015-11-25 ENCOUNTER — Ambulatory Visit (INDEPENDENT_AMBULATORY_CARE_PROVIDER_SITE_OTHER): Payer: Medicare Other | Admitting: Cardiovascular Disease

## 2015-11-25 ENCOUNTER — Encounter: Payer: Self-pay | Admitting: Cardiovascular Disease

## 2015-11-25 VITALS — BP 126/68 | HR 64 | Ht 62.0 in

## 2015-11-25 DIAGNOSIS — I5032 Chronic diastolic (congestive) heart failure: Secondary | ICD-10-CM

## 2015-11-25 DIAGNOSIS — I10 Essential (primary) hypertension: Secondary | ICD-10-CM

## 2015-11-25 DIAGNOSIS — I6523 Occlusion and stenosis of bilateral carotid arteries: Secondary | ICD-10-CM

## 2015-11-25 MED ORDER — FUROSEMIDE 20 MG PO TABS: 20.0000 mg | ORAL_TABLET | Freq: Every day | ORAL | Status: DC

## 2015-11-25 NOTE — Patient Instructions (Addendum)
Medication Instructions:  Your physician recommends that you continue on your current medications as directed. Please refer to the Current Medication list given to you today.  Labwork: NONE  Testing/Procedures: Your physician has requested that you have a carotid duplex in 6 months. This test is an ultrasound of the carotid arteries in your neck. It looks at blood flow through these arteries that supply the brain with blood. Allow one hour for this exam. There are no restrictions or special instructions.  Follow-Up: Your physician wants you to follow-up in: 6 month with Dr. Johnsie Cancel. You will receive a reminder letter in the mail two months in advance. If you don't receive a letter, please call our office to schedule the follow-up appointment.   If you need a refill on your cardiac medications before your next appointment, please call your pharmacy.

## 2015-12-07 DIAGNOSIS — Z23 Encounter for immunization: Secondary | ICD-10-CM | POA: Diagnosis not present

## 2015-12-08 DIAGNOSIS — I429 Cardiomyopathy, unspecified: Secondary | ICD-10-CM | POA: Diagnosis not present

## 2015-12-08 DIAGNOSIS — Z1389 Encounter for screening for other disorder: Secondary | ICD-10-CM | POA: Diagnosis not present

## 2015-12-08 DIAGNOSIS — M8588 Other specified disorders of bone density and structure, other site: Secondary | ICD-10-CM | POA: Diagnosis not present

## 2015-12-08 DIAGNOSIS — E039 Hypothyroidism, unspecified: Secondary | ICD-10-CM | POA: Diagnosis not present

## 2015-12-08 DIAGNOSIS — E7800 Pure hypercholesterolemia, unspecified: Secondary | ICD-10-CM | POA: Diagnosis not present

## 2015-12-08 DIAGNOSIS — I4891 Unspecified atrial fibrillation: Secondary | ICD-10-CM | POA: Diagnosis not present

## 2016-01-26 ENCOUNTER — Encounter: Payer: Self-pay | Admitting: Podiatry

## 2016-01-26 ENCOUNTER — Ambulatory Visit (INDEPENDENT_AMBULATORY_CARE_PROVIDER_SITE_OTHER): Payer: Medicare Other | Admitting: Podiatry

## 2016-01-26 VITALS — BP 124/67 | HR 79 | Resp 16

## 2016-01-26 DIAGNOSIS — Q828 Other specified congenital malformations of skin: Secondary | ICD-10-CM | POA: Diagnosis not present

## 2016-01-26 NOTE — Progress Notes (Signed)
   Subjective:    Patient ID: [name], female    DOB: 01-06-1946, 70 y.o.   MRN: [id number]  HPI : she presents today with her sister-in-law with a chief complaint of a painful lesion plantar aspect of the right foot for the last couple of months. She states that it bothers her with shoe gear and she can do F Lee not walk without shoes.    Review of Systems  All other systems reviewed and are negative.      Objective:   Physical Exam : vital signs are stable she is alert and oriented 3 pulses are strongly palpable. Neurologic sensorium is intact. Deep tendon reflexes are intact. Muscle strength is normal bilateral. Orthopedic evaluation demonstrates all joints distal to the ankle while full range of motion without crepitation. Cutaneous evaluation of a straight solitary porokeratotic lesion sub-fifth metatarsal head of the left foot exquisitely tender on palpation. This does not appear to be verrucoid in nature.           Assessment & Plan:   porokeratosis subcutaneous metatarsal right foot.  Plan: we discussed the etiology pathology conservative versus surgical therapies. At this point I injected her sublesionally with dexamethasone and local anesthetic and enucleated the lesion. Apply Betadine and a dressing afterwards this should help alleviate her symptoms immediately and I will follow-up with her as needed.

## 2016-01-28 DIAGNOSIS — M859 Disorder of bone density and structure, unspecified: Secondary | ICD-10-CM | POA: Diagnosis not present

## 2016-01-28 DIAGNOSIS — M8589 Other specified disorders of bone density and structure, multiple sites: Secondary | ICD-10-CM | POA: Diagnosis not present

## 2016-01-30 ENCOUNTER — Other Ambulatory Visit: Payer: Self-pay | Admitting: Cardiovascular Disease

## 2016-02-03 DIAGNOSIS — D2272 Melanocytic nevi of left lower limb, including hip: Secondary | ICD-10-CM | POA: Diagnosis not present

## 2016-02-03 DIAGNOSIS — D2261 Melanocytic nevi of right upper limb, including shoulder: Secondary | ICD-10-CM | POA: Diagnosis not present

## 2016-02-03 DIAGNOSIS — D2262 Melanocytic nevi of left upper limb, including shoulder: Secondary | ICD-10-CM | POA: Diagnosis not present

## 2016-02-03 DIAGNOSIS — L821 Other seborrheic keratosis: Secondary | ICD-10-CM | POA: Diagnosis not present

## 2016-02-03 DIAGNOSIS — D2271 Melanocytic nevi of right lower limb, including hip: Secondary | ICD-10-CM | POA: Diagnosis not present

## 2016-03-11 ENCOUNTER — Other Ambulatory Visit: Payer: Self-pay | Admitting: Cardiovascular Disease

## 2016-05-13 ENCOUNTER — Telehealth: Payer: Self-pay | Admitting: Podiatry

## 2016-05-13 NOTE — Telephone Encounter (Signed)
Pt lvm at 421pm on 5.26 to get appt with Dr Lemmie Evens. I returned call and had to lvm for pt to call me back

## 2016-05-20 NOTE — Progress Notes (Signed)
Patient ID: [name], female   DOB: October 27, 1946, 70 y.o.   MRN: [id number]    Cardiology Office Note   Date:  05/23/2016   ID:  [name] Jan 05, 1946, MRN [id number]  PCP:  Patricia Huh, MD  Cardiologist:  Dr. Jenkins [name]   Electrophysiologist:  n/a  Chief Complaint  Patient presents with  . Congestive Heart Failure    had one strange heart episode at end of april, per pt  . Hypertension     History of Present Illness: [name] is a 70 y.o. female with a hx of NICM with improved EF, diastolic HF, HTN, LBBB, hypothyroidism, Hodgkin's disease, breast CA, PAF, carotid stenosis. Patient underwent radiation therapy for Hodgkin's lymphoma in 1998, mastectomy for breast cancer in 2009 and recurrent breast CA in 2013. EF has been as low as 45% in the past. This is improved to normal over time. Flecainide was started in 12/13 for recurrent A. fib. ETT was negative for proarrhythmia. She was admitted in 5/14 with pulmonary edema in the setting of hypertensive crisis complicated by PEA arrest. She was switched to dofetilide.  Seen by me  08/26/15. She had a recent bout of strep throat and was placed on prednisone. Her blood pressure was running low and her Lasix and ARB were placed on hold. Follow-up echo demonstrated normal LV function with moderate MR. Labs indicated low TSH. Follow-up with primary care was recommended.  She returns for follow-up.  Doing well.  The patient denies chest pain, shortness of breath, syncope, orthopnea, PND or significant pedal edema. No further palpitations. She wants to stop lasix as BP  Is good feels dizzy doing chair yoga and no LE edema   Has f/u 3D MRI for right breast post cancer Rx in July   Studies/Reports Reviewed Today:  Echo 09/03/15 EF 50-55%, apical anteroseptal HK, trivial AI, MAC, moderate MR, PASP 32 mmHg, trivial effusion  Carotid US 4/16 R 40-59%; L 60-79%  ETT 12/13 normal - no evidence of ischemia by ST  analysis  LHC 5/05 LAD 30%   Past Medical History  Diagnosis Date  . Atrial fibrillation ([place])     dx in 2008;  CHADS2-VASc=5;  changed to Tikosyn during admx 05/2013; Xarelto started 05/2013  . LBBB (left bundle branch block)   . NICM (nonischemic cardiomyopathy) ([place])     a. EF as low as 45%;  b. LHC 5/05:  LM 10%, pLAD 30%, EF 45-50%,   c. Echo 5/13:  EF 60-65%, Gr 1 diast dysfn, MAC;   d.  Echo 11/13:  EF 60-65%, Gr 1 diast dysfn, Tr AI, mild MR;  d.  Echo 05/10/13: EF 69-48%, grade 1 diastolic dysfunction, PASP 36, small effusion.  Marland Kitchen HTN (hypertension)   . Hypothyroidism     2/2 XRT for Hodgkins Lymphoma  . Raynaud's disease   . Anemia   . Blood transfusion     1968  . Arthritis     fingers   . Hodgkin's disease     stage 2b;  s/p XRT 1988  . Breast cancer ([place]) 2009    T1N0M0 DCIS right breast  . Breast cancer ([place]) 2013    a. reocurrence right breast;  b. s/p Taxotere, Cytoxan and XRT  . Status post radiation therapy 1988    mantle and periaortic   . Mucositis 04/29/2012  . History of radiation therapy 07/10/12-08/13/12    right breast/chest wall  . Carotid stenosis     a.  dopplers 8/10:  RICA 1-75%, LICA 10-25% => repeat due in 01/2013;  b.  Carotid US (8/52): RICA 7-78%; LICA 24-23% - f/u 6 mos  . PEA (Pulseless electrical activity) ([place])     a. admx 05/2013 for APE in setting of HTN emergency c/b PEA arrest and asp pneumonia, VDRF, AFib with RVR    Past Surgical History  Procedure Laterality Date  . Tonsillectomy and adenoidectomy  1965  . Thoractomy  1968  . Btl  1971  . R vein ligation & stripping  1976  . Spleenectomy  1988    for Hodgkin's disease  . Breast lumpectomy  2009    right, lymph node biopsy  . Mastectomy  2009    right breast  . Polypectomy  1990    D&C (also in 1977 and [place])   . Dilation and curettage of uterus    . Tissue expander placement      right breast  . Tissue expander  removal w/ replacement of implant    . Mass excision  03/28/2012     Procedure: EXCISION MASS;  Surgeon: Edward Jolly, MD;  Location: [facility];  Service: General;  Laterality: Right;  Excision right chest wall mass  . Portacath placement  04/16/2012    Procedure: INSERTION PORT-A-CATH;  Surgeon: Edward Jolly, MD;  Location: [facility];  Service: General;  Laterality: Left;  Placement of Port-a-Cath, left subclavian  . Port-a-cath removal  10/03/2012    Procedure: REMOVAL PORT-A-CATH;  Surgeon: Edward Jolly, MD;  Location: [facility];  Service: General;  Laterality: N/A;     Current Outpatient Prescriptions  Medication Sig Dispense Refill  . calcium carbonate (OS-CAL) 600 MG TABS Take 600 mg by mouth 2 (two) times daily with a meal.     . carvedilol (COREG) 6.25 MG tablet TAKE 1 TABLET BY MOUTH TWICE DAILY WITH FOOD 180 tablet 3  . Cholecalciferol (VITAMIN D) 2000 UNITS CAPS Take 1 capsule by mouth daily.     Marland Kitchen dofetilide (TIKOSYN) 250 MCG capsule TAKE 1 CAPSULE(250 MCG) BY MOUTH EVERY 12 HOURS 180 capsule 2  . Lactobacillus (ACIDOPHILUS PROBIOTIC) 100 MG CAPS Take 100 mg by mouth daily.    Marland Kitchen levothyroxine (SYNTHROID, LEVOTHROID) 100 MCG tablet Take 100 mcg by mouth daily before breakfast.    . losartan (COZAAR) 25 MG tablet TAKE 1 TABLET BY MOUTH DAILY 90 tablet 1  . magnesium oxide (MAG-OX 400) 400 MG tablet Take 400 mg by mouth 2 (two) times daily.     . Multiple Vitamin (MULTIVITAMIN) tablet Take 1 tablet by mouth daily. Natural multivitamin    . Multiple Vitamins-Minerals (EYE VITAMINS PO) Take 1 tablet by mouth daily.    Marland Kitchen OVER THE COUNTER MEDICATION Take 1 tablet by mouth 2 (two) times daily. osteodenx supplement    . OVER THE COUNTER MEDICATION Take 3 tablets by mouth daily at 12 noon. Plant omega green supplement    . OVER THE COUNTER MEDICATION Take 1 tablet by mouth daily. CLARITY    . XARELTO 20 MG TABS tablet TAKE 1 TABLET BY MOUTH DAILY WITH SUPPER 90 tablet 3   No current facility-administered medications for  this visit.    Allergies:   Ace inhibitors; Benazepril; Contrast media; Epinephrine; Iohexol; Potassium-containing compounds; Prednisolone; and Taxotere    Social History:  The patient  reports that she has never smoked. She has never used smokeless tobacco. She reports that she does not drink alcohol or use illicit drugs.  Family History:  The patient's family history includes Hypertension in her mother. There is no history of Cancer.    ROS:   Please see the history of present illness.   Review of Systems  All other systems reviewed and are negative.     PHYSICAL EXAM: VS:  BP 106/60 mmHg  Pulse 81  Ht '5\' 2"'$  (1.575 m)  Wt 62.959 kg (138 lb 12.8 oz)  BMI 25.38 kg/m2  SpO2 95%  LMP 03/08/1995    Wt Readings from Last 3 Encounters:  05/23/16 62.959 kg (138 lb 12.8 oz)  10/08/15 62.914 kg (138 lb 11.2 oz)  09/16/15 62.506 kg (137 lb 12.8 oz)     GEN: Well nourished, well developed, in no acute distress HEENT: normal Neck: no JVD,   no masses Cardiac:  Normal S1/S2, RRR; no murmur ,  no rubs or gallops, no edema   Respiratory:  clear to auscultation bilaterally, no wheezing, rhonchi or rales. GI: soft, nontender, nondistended, + BS MS: no deformity or atrophy Skin: warm and dry  Neuro:  CNs II-XII intact, Strength and sensation are intact Psych: Normal affect   EKG:  EKG is not ordered today.  It demonstrates:   n/a   Recent Labs: 08/26/2015: TSH 0.31* 10/08/2015: ALT 16; BUN 19.8; Creatinine 0.8; HGB 13.0; Platelets 337; Potassium 4.8; Sodium 143    Lipid Panel    Component Value Date/Time   CHOL 163 05/18/2013 0440   TRIG 175* 05/18/2013 0440   HDL 26* 05/18/2013 0440   CHOLHDL 6.3 05/18/2013 0440   VLDL 35 05/18/2013 0440   LDLCALC 102* 05/18/2013 0440      ASSESSMENT AND PLAN:  1. Chronic Diastolic CHF:  Volume stable.  She is back on beta-blocker, angiotensin receptor blocker   2. PAF:  No recurrent palpitations.  Continue Xarelto, Tikosyn.     3. HTN:  Controlled. Stop lasix and observe will make watching K/Mg on tikosyn easier   4. Hypothyroidism: FU with PCP.    5. Carotid stenosis:  FU planned  Reviewed dupex from today and stable  09-62% LICA stenosis   6. Breast Cancer in remission f/u oncology  3D MRI in July     Disposition:    FU with me 6 mos.    [name]

## 2016-05-23 ENCOUNTER — Ambulatory Visit (INDEPENDENT_AMBULATORY_CARE_PROVIDER_SITE_OTHER): Payer: Medicare Other | Admitting: Cardiovascular Disease

## 2016-05-23 ENCOUNTER — Ambulatory Visit (HOSPITAL_COMMUNITY)
Admission: RE | Admit: 2016-05-23 | Discharge: 2016-05-23 | Disposition: A | Discharge status: Home / Self Care | Payer: Medicare Other | Source: Ambulatory Visit | Attending: Cardiovascular Disease | Admitting: Cardiovascular Disease

## 2016-05-23 ENCOUNTER — Encounter: Payer: Self-pay | Admitting: Cardiovascular Disease

## 2016-05-23 VITALS — BP 106/60 | HR 81 | Ht 62.0 in | Wt 138.8 lb

## 2016-05-23 DIAGNOSIS — I509 Heart failure, unspecified: Secondary | ICD-10-CM | POA: Diagnosis not present

## 2016-05-23 DIAGNOSIS — I10 Essential (primary) hypertension: Secondary | ICD-10-CM

## 2016-05-23 DIAGNOSIS — I110 Hypertensive heart disease with heart failure: Secondary | ICD-10-CM | POA: Insufficient documentation

## 2016-05-23 DIAGNOSIS — I6523 Occlusion and stenosis of bilateral carotid arteries: Secondary | ICD-10-CM | POA: Insufficient documentation

## 2016-05-23 DIAGNOSIS — I5032 Chronic diastolic (congestive) heart failure: Secondary | ICD-10-CM | POA: Insufficient documentation

## 2016-05-23 NOTE — Patient Instructions (Addendum)
Medication Instructions:  Your physician has recommended you make the following change in your medication:  1-STOP Lasix  Labwork: NONE  Testing/Procedures: NONE  Follow-Up: Your physician wants you to follow-up in: 6 months with Dr. Johnsie Cancel. You will receive a reminder letter in the mail two months in advance. If you don't receive a letter, please call our office to schedule the follow-up appointment.   If you need a refill on your cardiac medications before your next appointment, please call your pharmacy.

## 2016-05-26 ENCOUNTER — Encounter: Payer: Self-pay | Admitting: Podiatry

## 2016-05-26 ENCOUNTER — Ambulatory Visit (INDEPENDENT_AMBULATORY_CARE_PROVIDER_SITE_OTHER): Payer: Medicare Other | Admitting: Podiatry

## 2016-05-26 DIAGNOSIS — Q828 Other specified congenital malformations of skin: Secondary | ICD-10-CM | POA: Diagnosis not present

## 2016-05-26 NOTE — Progress Notes (Signed)
She presents today with chief complaint of a painful lesion plantar aspect of the right foot. She states that it feels like there is a seeded this corn again.  Objective: Vital signs are stable alert and oriented 3. Pulses are palpable right foot. Solitary poor keratoma plantar aspect of the forefoot right.  Assessment: Porokeratosis right foot.  Plan: Mechanical debridement of the lesions today follow up with me on an as-needed basis.

## 2016-06-02 DIAGNOSIS — H2513 Age-related nuclear cataract, bilateral: Secondary | ICD-10-CM | POA: Diagnosis not present

## 2016-06-02 DIAGNOSIS — H2511 Age-related nuclear cataract, right eye: Secondary | ICD-10-CM | POA: Diagnosis not present

## 2016-06-02 DIAGNOSIS — H40013 Open angle with borderline findings, low risk, bilateral: Secondary | ICD-10-CM | POA: Diagnosis not present

## 2016-07-06 ENCOUNTER — Other Ambulatory Visit: Payer: Self-pay | Admitting: Cardiovascular Disease

## 2016-07-14 ENCOUNTER — Other Ambulatory Visit: Payer: Self-pay | Admitting: Obstetrics and Gynecology

## 2016-07-14 DIAGNOSIS — Z124 Encounter for screening for malignant neoplasm of cervix: Secondary | ICD-10-CM | POA: Diagnosis not present

## 2016-07-14 DIAGNOSIS — Z1231 Encounter for screening mammogram for malignant neoplasm of breast: Secondary | ICD-10-CM | POA: Diagnosis not present

## 2016-07-14 DIAGNOSIS — N952 Postmenopausal atrophic vaginitis: Secondary | ICD-10-CM | POA: Diagnosis not present

## 2016-07-19 LAB — CYTOLOGY - PAP

## 2016-07-30 ENCOUNTER — Other Ambulatory Visit: Payer: Self-pay | Admitting: Cardiovascular Disease

## 2016-08-02 DIAGNOSIS — H2513 Age-related nuclear cataract, bilateral: Secondary | ICD-10-CM | POA: Diagnosis not present

## 2016-08-02 DIAGNOSIS — H40013 Open angle with borderline findings, low risk, bilateral: Secondary | ICD-10-CM | POA: Diagnosis not present

## 2016-08-12 ENCOUNTER — Other Ambulatory Visit: Payer: Self-pay | Admitting: Obstetrics and Gynecology

## 2016-08-12 DIAGNOSIS — Z124 Encounter for screening for malignant neoplasm of cervix: Secondary | ICD-10-CM | POA: Diagnosis not present

## 2016-08-14 ENCOUNTER — Emergency Department (HOSPITAL_COMMUNITY): Payer: Medicare Other

## 2016-08-14 ENCOUNTER — Observation Stay (HOSPITAL_COMMUNITY)
Admission: EM | Admit: 2016-08-14 | Discharge: 2016-08-16 | Disposition: A | Discharge status: Home / Self Care | Payer: Medicare Other | Attending: Cardiology | Admitting: Cardiology

## 2016-08-14 ENCOUNTER — Encounter (HOSPITAL_COMMUNITY): Payer: Self-pay | Admitting: Emergency Medicine

## 2016-08-14 DIAGNOSIS — R9389 Abnormal findings on diagnostic imaging of other specified body structures: Secondary | ICD-10-CM

## 2016-08-14 DIAGNOSIS — Z79899 Other long term (current) drug therapy: Secondary | ICD-10-CM | POA: Insufficient documentation

## 2016-08-14 DIAGNOSIS — I5032 Chronic diastolic (congestive) heart failure: Secondary | ICD-10-CM | POA: Diagnosis not present

## 2016-08-14 DIAGNOSIS — I482 Chronic atrial fibrillation: Secondary | ICD-10-CM | POA: Diagnosis not present

## 2016-08-14 DIAGNOSIS — I429 Cardiomyopathy, unspecified: Secondary | ICD-10-CM | POA: Diagnosis not present

## 2016-08-14 DIAGNOSIS — E039 Hypothyroidism, unspecified: Secondary | ICD-10-CM | POA: Diagnosis present

## 2016-08-14 DIAGNOSIS — I4891 Unspecified atrial fibrillation: Secondary | ICD-10-CM

## 2016-08-14 DIAGNOSIS — I110 Hypertensive heart disease with heart failure: Secondary | ICD-10-CM | POA: Diagnosis not present

## 2016-08-14 DIAGNOSIS — I480 Paroxysmal atrial fibrillation: Principal | ICD-10-CM | POA: Diagnosis present

## 2016-08-14 DIAGNOSIS — Z8674 Personal history of sudden cardiac arrest: Secondary | ICD-10-CM | POA: Diagnosis not present

## 2016-08-14 DIAGNOSIS — I10 Essential (primary) hypertension: Secondary | ICD-10-CM | POA: Diagnosis present

## 2016-08-14 DIAGNOSIS — I499 Cardiac arrhythmia, unspecified: Secondary | ICD-10-CM | POA: Diagnosis not present

## 2016-08-14 DIAGNOSIS — Z7901 Long term (current) use of anticoagulants: Secondary | ICD-10-CM | POA: Insufficient documentation

## 2016-08-14 DIAGNOSIS — I447 Left bundle-branch block, unspecified: Secondary | ICD-10-CM | POA: Diagnosis not present

## 2016-08-14 DIAGNOSIS — R0981 Nasal congestion: Secondary | ICD-10-CM | POA: Diagnosis not present

## 2016-08-14 DIAGNOSIS — Z853 Personal history of malignant neoplasm of breast: Secondary | ICD-10-CM | POA: Diagnosis not present

## 2016-08-14 DIAGNOSIS — Z923 Personal history of irradiation: Secondary | ICD-10-CM | POA: Diagnosis not present

## 2016-08-14 DIAGNOSIS — R918 Other nonspecific abnormal finding of lung field: Secondary | ICD-10-CM | POA: Diagnosis not present

## 2016-08-14 DIAGNOSIS — I428 Other cardiomyopathies: Secondary | ICD-10-CM

## 2016-08-14 DIAGNOSIS — Z8571 Personal history of Hodgkin lymphoma: Secondary | ICD-10-CM | POA: Diagnosis not present

## 2016-08-14 LAB — TSH: TSH: 2.038 u[IU]/mL (ref 0.350–4.500)

## 2016-08-14 LAB — TROPONIN I

## 2016-08-14 LAB — BASIC METABOLIC PANEL
ANION GAP: 8 (ref 5–15)
BUN: 25 mg/dL — ABNORMAL HIGH (ref 6–20)
CO2: 23 mmol/L (ref 22–32)
Calcium: 8.7 mg/dL — ABNORMAL LOW (ref 8.9–10.3)
Chloride: 104 mmol/L (ref 101–111)
Creatinine, Ser: 0.87 mg/dL (ref 0.44–1.00)
GFR calc Af Amer: >60 mL/min (ref >60)
Glucose, Bld: 118 mg/dL — ABNORMAL HIGH (ref 65–99)
POTASSIUM: 3.9 mmol/L (ref 3.5–5.1)
SODIUM: 135 mmol/L (ref 135–145)

## 2016-08-14 LAB — MAGNESIUM: Magnesium: 2.2 mg/dL (ref 1.7–2.4)

## 2016-08-14 LAB — CBC
HEMATOCRIT: 42.3 % (ref 36.0–46.0)
HEMOGLOBIN: 13.8 g/dL (ref 12.0–15.0)
MCH: 30.5 pg (ref 26.0–34.0)
MCHC: 32.6 g/dL (ref 30.0–36.0)
MCV: 93.4 fL (ref 78.0–100.0)
Platelets: 306 10*3/uL (ref 150–400)
RBC: 4.53 MIL/uL (ref 3.87–5.11)
RDW: 13.7 % (ref 11.5–15.5)
WBC: 10.6 10*3/uL — AB (ref 4.0–10.5)

## 2016-08-14 LAB — PROTIME-INR
INR: 1.71
PROTHROMBIN TIME: 20.2 s — AB (ref 11.4–15.2)

## 2016-08-14 MED ORDER — CARVEDILOL 12.5 MG PO TABS
12.5000 mg | ORAL_TABLET | Freq: Two times a day (BID) | ORAL | Status: DC
  Administered 2016-08-15 – 2016-08-16 (×3): 12.5 mg via ORAL
  Filled 2016-08-14 (×3): qty 1

## 2016-08-14 MED ORDER — SODIUM CHLORIDE 0.9% FLUSH
3.0000 mL | Freq: Two times a day (BID) | INTRAVENOUS | Status: DC
  Administered 2016-08-14 – 2016-08-15 (×3): 3 mL via INTRAVENOUS

## 2016-08-14 MED ORDER — METOPROLOL TARTRATE 5 MG/5ML IV SOLN
5.0000 mg | Freq: Once | INTRAVENOUS | Status: AC
  Administered 2016-08-14: 5 mg via INTRAVENOUS
  Filled 2016-08-14: qty 5

## 2016-08-14 MED ORDER — RIVAROXABAN 20 MG PO TABS
20.0000 mg | ORAL_TABLET | Freq: Every day | ORAL | Status: DC
  Administered 2016-08-15: 20 mg via ORAL
  Filled 2016-08-14: qty 1

## 2016-08-14 MED ORDER — LOSARTAN POTASSIUM 25 MG PO TABS: 25.0000 mg | ORAL_TABLET | Freq: Every day | ORAL | Status: DC

## 2016-08-14 MED ORDER — DOFETILIDE 250 MCG PO CAPS
250.0000 ug | ORAL_CAPSULE | Freq: Two times a day (BID) | ORAL | Status: DC
  Administered 2016-08-15 – 2016-08-16 (×3): 250 ug via ORAL
  Filled 2016-08-14 (×3): qty 1

## 2016-08-14 MED ORDER — MAGNESIUM OXIDE 400 (241.3 MG) MG PO TABS
400.0000 mg | ORAL_TABLET | Freq: Two times a day (BID) | ORAL | Status: DC
  Administered 2016-08-15 (×2): 400 mg via ORAL
  Filled 2016-08-14: qty 1

## 2016-08-14 MED ORDER — LEVOTHYROXINE SODIUM 100 MCG PO TABS
100.0000 ug | ORAL_TABLET | Freq: Every day | ORAL | Status: DC
  Administered 2016-08-15 – 2016-08-16 (×2): 100 ug via ORAL
  Filled 2016-08-14 (×2): qty 1

## 2016-08-14 NOTE — H&P (Signed)
C.c  "weakness, palpitations" HPI: Per office note "[name] is a 70 y.o. female with a hx of NICM with improved EF, diastolic HF, HTN, LBBB, hypothyroidism, Hodgkin's disease, breast CA, PAF, carotid stenosis. Patient underwent radiation therapy for Hodgkin's lymphoma in 1998, mastectomy for breast cancer in 2009 and recurrent breast CA in 2013. EF has been as low as 45% in the past. This is improved to normal over time. Flecainide was started in 12/13 for recurrent A. fib. ETT was negative for proarrhythmia. She was admitted in 5/14 with pulmonary edema in the setting of hypertensive crisis complicated by PEA arrest. She was switched to dofetilide." She presents today to ED with c/o intermittent palpitations associated with weakness, fatigue and dizziness for the last 3 days. The episodes were initially lasting few minutes but today they are more prolonged and symptomatic. In ED she was found to be going in and out of Afib with rate in 150s. She was given a dose of iv lopressor in ER. She denies any other symptoms. She is recovering from upper resp infection/sinus drainage.  Echo 09/03/15 EF 50-55%, apical anteroseptal HK, trivial AI, MAC, moderate MR, PASP 32 mmHg, trivial effusion  Carotid US 4/16 R 40-59%; L 60-79%  ETT 12/13 normal - no evidence of ischemia by ST analysis  LHC 5/05 LAD 30%  Review of Systems:     Cardiac Review of Systems: {Y] = yes '[ ]'$  = no  Chest Pain [    ]  Resting SOB [   ] Exertional SOB  [  ]  Orthopnea [  ]   Pedal Edema [   ]    Palpitations [ y ] Syncope  [  ]   Presyncope [   ]  General Review of Systems: [Y] = yes [  ]=no Constitional: recent weight change [  ]; anorexia [  ]; fatigue Blue.Reese  ]; nausea [  ]; night sweats [  ]; fever [  ]; or chills [  ];                                                                     Dental: poor dentition[  ];   Eye : blurred vision [  ]; diplopia [   ]; vision changes [  ];  Amaurosis fugax[  ]; Resp: cough [   ];  wheezing[  ];  hemoptysis[  ]; shortness of breath[  ]; paroxysmal nocturnal dyspnea[  ]; dyspnea on exertion[  ]; or orthopnea[  ];  GI:  gallstones[  ], vomiting[  ];  dysphagia[  ]; melena[  ];  hematochezia [  ]; heartburn[  ];   GU: kidney stones [  ]; hematuria[  ];   dysuria [  ];  nocturia[  ];               Skin: rash [  ], swelling[  ];, hair loss[  ];  peripheral edema[  ];  or itching[  ]; Musculosketetal: myalgias[  ];  joint swelling[  ];  joint erythema[  ];  joint pain[  ];  back pain[  ];  Heme/Lymph: bruising[  ];  bleeding[  ];  anemia[  ];  Neuro: TIA[  ];  headaches[  ];  stroke[  ];  vertigo[  ];  seizures[  ];   paresthesias[  ];  difficulty walking[  ];  Psych:depression[  ]; anxiety[  ];  Endocrine: diabetes[  ];  thyroid dysfunction[  ];  Other:  Past Medical History:  Diagnosis Date  . Anemia   . Arthritis    fingers   . Atrial fibrillation ([place])    dx in 2008;  CHADS2-VASc=5;  changed to Tikosyn during admx 05/2013; Xarelto started 05/2013  . Blood transfusion    1968  . Breast cancer ([place]) 2009   T1N0M0 DCIS right breast  . Breast cancer ([place]) 2013   a. reocurrence right breast;  b. s/p Taxotere, Cytoxan and XRT  . Carotid stenosis    a. dopplers 2/56:  RICA 3-89%, LICA 37-34% => repeat due in 01/2013;  b.  Carotid US (2/87): RICA 6-81%; LICA 15-72% - f/u 6 mos  . History of radiation therapy 07/10/12-08/13/12   right breast/chest wall  . Hodgkin's disease    stage 2b;  s/p XRT 1988  . HTN (hypertension)   . Hypothyroidism    2/2 XRT for Hodgkins Lymphoma  . LBBB (left bundle branch block)   . Mucositis 04/29/2012  . NICM (nonischemic cardiomyopathy) ([place])    a. EF as low as 45%;  b. LHC 5/05:  LM 10%, pLAD 30%, EF 45-50%,   c. Echo 5/13:  EF 60-65%, Gr 1 diast dysfn, MAC;   d.  Echo 11/13:  EF 60-65%, Gr 1 diast dysfn, Tr AI, mild MR;  d.  Echo 05/10/13: EF 62-03%, grade 1 diastolic dysfunction, PASP 36, small effusion.  . PEA (Pulseless electrical  activity) ([place])    a. admx 05/2013 for APE in setting of HTN emergency c/b PEA arrest and asp pneumonia, VDRF, AFib with RVR  . Raynaud's disease   . Status post radiation therapy 1988   mantle and periaortic    No current facility-administered medications on file prior to encounter.    Current Outpatient Prescriptions on File Prior to Encounter  Medication Sig Dispense Refill  . calcium carbonate (OS-CAL) 600 MG TABS Take 600 mg by mouth 2 (two) times daily with a meal.     . carvedilol (COREG) 6.25 MG tablet TAKE 1 TABLET BY MOUTH TWICE DAILY WITH FOOD 180 tablet 3  . Cholecalciferol (VITAMIN D) 2000 UNITS CAPS Take 1 capsule by mouth daily.     Marland Kitchen dofetilide (TIKOSYN) 250 MCG capsule TAKE 1 CAPSULE(250 MCG) BY MOUTH EVERY 12 HOURS 180 capsule 2  . Lactobacillus (ACIDOPHILUS PROBIOTIC) 100 MG CAPS Take 100 mg by mouth daily.    Marland Kitchen levothyroxine (SYNTHROID, LEVOTHROID) 100 MCG tablet Take 100 mcg by mouth daily before breakfast.    . losartan (COZAAR) 25 MG tablet TAKE 1 TABLET BY MOUTH DAILY 90 tablet 2  . magnesium oxide (MAG-OX 400) 400 MG tablet Take 400 mg by mouth 2 (two) times daily.     . Multiple Vitamin (MULTIVITAMIN) tablet Take 1 tablet by mouth daily. Natural multivitamin    . Multiple Vitamins-Minerals (EYE VITAMINS PO) Take 1 tablet by mouth daily.    Marland Kitchen OVER THE COUNTER MEDICATION Take 1 tablet by mouth 2 (two) times daily. osteodenx supplement    . OVER THE COUNTER MEDICATION Take 3 tablets by mouth daily at 12 noon. Plant omega green supplement    . OVER THE COUNTER MEDICATION Take 1 tablet by mouth daily. CLARITY    . XARELTO 20 MG TABS  tablet TAKE 1 TABLET BY MOUTH DAILY WITH DINNER 90 tablet 3      Allergies  Allergen Reactions  . Ace Inhibitors     Cough  . Benazepril     cough  . Contrast Media [Iodinated Diagnostic Agents]     Sneezing   . Epinephrine     REACTION: BUZZ  . Iohexol      Desc: SNEEZING, pt states this happened in 1988. Has had iv contrast  since without being premedicated and has had no problems. Chest ct 2005- [place] 05/09/12   . Potassium-Containing Compounds     Projectile vomiting  . Prednisolone Other (See Comments)    ARRYTHMIAS  . Taxotere [Docetaxel] Other (See Comments)    Fluid overload    Social History   Social History  . Marital status: Married    Spouse name: Donnie  . Number of children: N/A  . Years of education: N/A   Occupational History  . [employer]   Social History Main Topics  . Smoking status: Never Smoker  . Smokeless tobacco: Never Used  . Alcohol use No  . Drug use: No  . Sexual activity: Not Currently     Comment: post menopausal, HRT x 8 yrs   Other Topics Concern  . Not on file   Social History Narrative   Regular exercise. Retired and married.     Family History  Problem Relation Age of Onset  . Hypertension Mother   . Cancer Neg Hx     PHYSICAL EXAM: Vitals:   08/14/16 1930 08/14/16 2030  BP: 169/91 135/86  Pulse: 81 75  Resp: 14 21  Temp:     General:  Well appearing. No respiratory difficulty HEENT: normal Neck: supple. no JVD. Carotids 2+ bilat; no bruits. No lymphadenopathy or thryomegaly appreciated. Cor: PMI nondisplaced. Regular rate & rhythm. No rubs, gallops or murmurs. Lungs: clear Abdomen: soft, nontender, nondistended. No hepatosplenomegaly. No bruits or masses. Good bowel sounds. Extremities: no cyanosis, clubbing, rash, edema Neuro: alert & oriented x 3, cranial nerves grossly intact. moves all 4 extremities w/o difficulty. Affect pleasant.  ECG:  Results for orders placed or performed during the hospital encounter of 08/14/16 (from the past 24 hour(s))  CBC     Status: Abnormal   Collection Time: 08/14/16  8:43 PM  Result Value Ref Range   WBC 10.6 (H) 4.0 - 10.5 K/uL   RBC 4.53 3.87 - 5.11 MIL/uL   Hemoglobin 13.8 12.0 - 15.0 g/dL   HCT 42.3 36.0 - 46.0 %   MCV 93.4 78.0 - 100.0 fL   MCH 30.5 26.0 - 34.0 pg   MCHC 32.6  30.0 - 36.0 g/dL   RDW 13.7 11.5 - 15.5 %   Platelets 306 150 - 400 K/uL   Dg Chest Portable 1 View  Result Date: 08/14/2016 CLINICAL DATA:  Heart problems EXAM: PORTABLE CHEST 1 VIEW COMPARISON:  04/17/2015 FINDINGS: 2020 hours. Haziness overlying the lung bases likely related to superimposition of soft tissues, but pneumonia in either lower lung cannot be completely excluded. Cardiopericardial silhouette is at upper limits of normal for size. Bones are diffusely demineralized. Telemetry leads overlie the chest. IMPRESSION: Superimposition of soft tissues with hazy opacity overlying the lung bases. Basilar pneumonia not excluded. Dedicated upright PA and lateral film recommended when the patient is able. Electronically Signed   By: Misty Stanley M.D.   On: 08/14/2016 20:51     ASSESSMENT: 1. PAF CHADSVASC2 score =  4 on xarelto. Also taking tikosyn 2. HTN 3. Hypothyroidism 4. NICM  PLAN/DISCUSSION: Observe overnight  1. Will increase her bb. Her QTC is 450 msec. If still uncontrollable then will increase tikosyn.  2. Will check TSH  [facility]

## 2016-08-14 NOTE — ED Provider Notes (Signed)
[facility] DEPT Provider Note   CSN: [id number] Arrival date & time: 08/14/16  1914     History   Chief Complaint Chief Complaint  Patient presents with  . Atrial Fibrillation  . Shortness of Breath    HPI [name] is a 70 y.o. female.  HPI Patient has a history of paroxysmal atrial fibrillation. This developed as a complication of radiation therapy the patient had undergone in 1998 for Hodgkin's lymphoma. Paroxysmal atrial fibrillation began in 2007 when she developed nonischemic cardiomyopathy. In 2014, the patient had a complicated medical course whereby she developed flash pulmonary edema with hypertensive crisis and a PEA cardiac arrest. Since that time, the patient has done well. She has had rate control on Tikosyn. Occasionally, she will have episodes of elevated heart rate that she can feel as racing of the heart. At those times he is able to relax and with rest it typically resolves. For the past 3 days, she has had paroxysmal episodes of heart racing. She has had no associated chest pain. She has been relaxing and having low activity. Today the symptoms became more prolonged and intense. She is experienced episodes of lightheadedness but no syncope. Dyspnea or peripheral edema. Past Medical History:  Diagnosis Date  . Anemia   . Arthritis    fingers   . Atrial fibrillation ([place])    dx in 2008;  CHADS2-VASc=5;  changed to Tikosyn during admx 05/2013; Xarelto started 05/2013  . Blood transfusion    1968  . Breast cancer ([place]) 2009   T1N0M0 DCIS right breast  . Breast cancer ([place]) 2013   a. reocurrence right breast;  b. s/p Taxotere, Cytoxan and XRT  . Carotid stenosis    a. dopplers 6/60:  RICA 6-30%, LICA 16-01% => repeat due in 01/2013;  b.  Carotid US (0/93): RICA 2-35%; LICA 57-32% - f/u 6 mos  . History of radiation therapy 07/10/12-08/13/12   right breast/chest wall  . Hodgkin's disease    stage 2b;  s/p XRT 1988  . HTN (hypertension)   . Hypothyroidism      2/2 XRT for Hodgkins Lymphoma  . LBBB (left bundle branch block)   . Mucositis 04/29/2012  . NICM (nonischemic cardiomyopathy) ([place])    a. EF as low as 45%;  b. LHC 5/05:  LM 10%, pLAD 30%, EF 45-50%,   c. Echo 5/13:  EF 60-65%, Gr 1 diast dysfn, MAC;   d.  Echo 11/13:  EF 60-65%, Gr 1 diast dysfn, Tr AI, mild MR;  d.  Echo 05/10/13: EF 20-25%, grade 1 diastolic dysfunction, PASP 36, small effusion.  . PEA (Pulseless electrical activity) ([place])    a. admx 05/2013 for APE in setting of HTN emergency c/b PEA arrest and asp pneumonia, VDRF, AFib with RVR  . Raynaud's disease   . Status post radiation therapy 1988   mantle and periaortic     Patient Active Problem List   Diagnosis Date Noted  . NICM (nonischemic cardiomyopathy) ([place]) 08/15/2016  . PAF (paroxysmal atrial fibrillation) ([place]) 08/14/2016  . Cardiac arrest ([place]) 05/23/2013  . Pneumococcal pneumonia ([place]) 05/23/2013  . Pleural effusion 05/15/2013  . Carotid artery disease ([place]) 10/26/2012  . Chest wall recurrence of breast cancer ([place]) 07/09/2012  . Dyspnea 05/09/2012  . Normocytic anemia 05/09/2012  . Sinus tachycardia ([place]) 04/29/2012  . Mucositis 04/29/2012  . Breast cancer ([place])   . Status post radiation therapy   . Breast mass, right 03/23/2012  . Chronic diastolic CHF (  congestive heart failure) ([place]) 11/28/2011  . Carotid bruit 11/28/2011  .  Cancer of Breast T1bN0M0 triple neg S/P mastectomy/reconstruction 04/2008 11/25/2011  . Carcinoma in situ of breast 05/02/2009  . Hypothyroidism 05/02/2009  . Essential hypertension 05/02/2009  . CARDIOMYOPATHY, SECONDARY 05/02/2009  . LBBB (left bundle branch block) 05/02/2009    Past Surgical History:  Procedure Laterality Date  . BREAST LUMPECTOMY  2009   right, lymph node biopsy  . BTL  1971  . DILATION AND CURETTAGE OF UTERUS    . MASS EXCISION  03/28/2012   Procedure: EXCISION MASS;  Surgeon: Edward Jolly, MD;  Location: [facility];  Service:  General;  Laterality: Right;  Excision right chest wall mass  . MASTECTOMY  2009   right breast  . POLYPECTOMY  1990   D&C (also in 1977 and [place])   . PORT-A-CATH REMOVAL  10/03/2012   Procedure: REMOVAL PORT-A-CATH;  Surgeon: Edward Jolly, MD;  Location: [facility];  Service: General;  Laterality: N/A;  . PORTACATH PLACEMENT  04/16/2012   Procedure: INSERTION PORT-A-CATH;  Surgeon: Edward Jolly, MD;  Location: [facility];  Service: General;  Laterality: Left;  Placement of Port-a-Cath, left subclavian  . R vein ligation & stripping  1976  . Spleenectomy  1988   for Hodgkin's disease  . Thoractomy  1968  . TISSUE EXPANDER  REMOVAL W/ REPLACEMENT OF IMPLANT    . TISSUE EXPANDER PLACEMENT     right breast  . TONSILLECTOMY AND ADENOIDECTOMY  1965    OB History    No data available       Home Medications    Prior to Admission medications   Medication Sig Start Date End Date Taking? Authorizing Provider  calcium carbonate (OS-CAL) 600 MG TABS Take 600 mg by mouth at bedtime.    Yes Historical Provider, MD  Cholecalciferol (VITAMIN D) 2000 UNITS CAPS Take 1 capsule by mouth daily.    Yes Historical Provider, MD  dofetilide (TIKOSYN) 250 MCG capsule TAKE 1 CAPSULE(250 MCG) BY MOUTH EVERY 12 HOURS 03/11/16  Yes Josue Hector, MD  levothyroxine (SYNTHROID, LEVOTHROID) 100 MCG tablet Take 100 mcg by mouth daily before breakfast.   Yes Historical Provider, MD  losartan (COZAAR) 25 MG tablet TAKE 1 TABLET BY MOUTH DAILY 08/01/16  Yes Josue Hector, MD  LUTEIN-ZEAXANTHIN PO Take 1 capsule by mouth every morning.   Yes Historical Provider, MD  magnesium oxide (MAG-OX 400) 400 MG tablet Take 400 mg by mouth 2 (two) times daily.    Yes Historical Provider, MD  Multiple Vitamin (MULTIVITAMIN) tablet Take 1 tablet by mouth every morning. "Natural Vitamin"   Yes Historical Provider, MD  naproxen sodium (ALEVE) 220 MG tablet Take 220 mg by mouth 2 (two) times daily as needed (for mild pain).    Yes Historical Provider, MD  OVER THE COUNTER MEDICATION Osteodenz Capsules: Take 1 capsule by mouth at bedtime   Yes Historical Provider, MD  OVER THE COUNTER MEDICATION Vegetarian Omega Green: Take 3 capsules by mouth daily   Yes Historical Provider, MD  OVER THE COUNTER MEDICATION Mental Clarity: Take 1 capsule by mouth every morning   Yes Historical Provider, MD  XARELTO 20 MG TABS tablet TAKE 1 TABLET BY MOUTH DAILY WITH DINNER 07/06/16  Yes Josue Hector, MD  carvedilol (COREG) 6.25 MG tablet Take 2 tablets (12.5 mg total) by mouth 2 (two) times daily with a meal. 08/16/16   Cheryln Manly, NP  ketorolac (ACULAR) 0.4 % SOLN Use as directed/Pre-op 08/03/16   Historical Provider, MD  ofloxacin (OCUFLOX) 0.3 % ophthalmic solution Use as directed/Pre-op 08/02/16   Historical Provider, MD  prednisoLONE acetate (PRED FORTE) 1 % ophthalmic suspension Use as directed/Pre-op 08/02/16   Historical Provider, MD    Family History Family History  Problem Relation Age of Onset  . Hypertension Mother   . Cancer Neg Hx     Social History Social History  Substance Use Topics  . Smoking status: Never Smoker  . Smokeless tobacco: Never Used  . Alcohol use No     Allergies   Ace inhibitors; Benazepril; Contrast media [iodinated diagnostic agents]; Epinephrine; Iohexol; Potassium-containing compounds; Prednisolone; and Taxotere [docetaxel]   Review of Systems Review of Systems 10 Systems reviewed and are negative for acute change except as noted in the HPI.  Physical Exam Updated Vital Signs BP (!) 159/68 (BP Location: Left Arm)   Pulse 63   Temp 98.9 F (37.2 C) (Oral)   Resp 18   Ht '5\' 2"'$  (1.575 m)   Wt 136 lb 4.8 oz (61.8 kg) Comment: scale b  LMP 03/08/1995   SpO2 92%   BMI 24.93 kg/m   Physical Exam  Constitutional: She appears well-developed and well-nourished. No distress.  HENT:  Head: Normocephalic and atraumatic.  Eyes: Conjunctivae are normal.  Neck: Neck supple.    Cardiovascular: Normal rate and regular rhythm.   No murmur heard. Pulmonary/Chest: Effort normal and breath sounds normal. No respiratory distress.  Abdominal: Soft. There is no tenderness.  Musculoskeletal: She exhibits no edema.  Neurological: She is alert.  Skin: Skin is warm and dry.  Psychiatric: She has a normal mood and affect.  Nursing note and vitals reviewed.    ED Treatments / Results  Labs (all labs ordered are listed, but only abnormal results are displayed) Labs Reviewed  BASIC METABOLIC PANEL - Abnormal; Notable for the following:       Result Value   Glucose, Bld 118 (*)    BUN 25 (*)    Calcium 8.7 (*)    All other components within normal limits  CBC - Abnormal; Notable for the following:    WBC 10.6 (*)    All other components within normal limits  PROTIME-INR - Abnormal; Notable for the following:    Prothrombin Time 20.2 (*)    All other components within normal limits  MAGNESIUM  TSH  TROPONIN I    EKG  EKG Interpretation  Date/Time:  Sunday August 14 2016 19:19:13 EDT Ventricular Rate:  91 PR Interval:    QRS Duration: 141 QT Interval:  420 QTC Calculation: 517 R Axis:   -67 Text Interpretation:  Sinus rhythm Left bundle branch block Baseline wander in lead(s) V1 Confirmed by Johnney Killian, MD, Jeannie Done [phone]) on 08/14/2016 7:34:36 PM       Radiology No results found.  Procedures Procedures (including critical care time)  Medications Ordered in ED Medications  metoprolol (LOPRESSOR) injection 5 mg (5 mg Intravenous Given 08/14/16 2002)     Initial Impression / Assessment and Plan / ED Course  I have reviewed the triage vital signs and the nursing notes.  Pertinent labs & imaging results that were available during my care of the patient were reviewed by me and considered in my medical decision making (see chart for details).  Clinical Course     Final Clinical Impressions(s) / ED Diagnoses   Final diagnoses:  Atrial fibrillation  (HCC)  Nasal congestion  Abnormal chest x-ray    New Prescriptions Discharge Medication List as of 08/16/2016 10:58 AM       Charlesetta Shanks, MD 09/06/16 2003

## 2016-08-14 NOTE — ED Triage Notes (Signed)
Pt currently in NSR.

## 2016-08-14 NOTE — ED Triage Notes (Signed)
Pt in EMS from home, called out for "heart problems" pt was noted to be in afib RVR, HR 170's. Also C/O SOB. Denies CP. Pt 90-91% RA.

## 2016-08-15 ENCOUNTER — Observation Stay (HOSPITAL_COMMUNITY): Payer: Medicare Other

## 2016-08-15 DIAGNOSIS — Z923 Personal history of irradiation: Secondary | ICD-10-CM | POA: Diagnosis not present

## 2016-08-15 DIAGNOSIS — I429 Cardiomyopathy, unspecified: Secondary | ICD-10-CM | POA: Diagnosis not present

## 2016-08-15 DIAGNOSIS — I110 Hypertensive heart disease with heart failure: Secondary | ICD-10-CM | POA: Diagnosis not present

## 2016-08-15 DIAGNOSIS — I480 Paroxysmal atrial fibrillation: Secondary | ICD-10-CM | POA: Diagnosis not present

## 2016-08-15 DIAGNOSIS — I428 Other cardiomyopathies: Secondary | ICD-10-CM

## 2016-08-15 DIAGNOSIS — Z853 Personal history of malignant neoplasm of breast: Secondary | ICD-10-CM | POA: Diagnosis not present

## 2016-08-15 DIAGNOSIS — I447 Left bundle-branch block, unspecified: Secondary | ICD-10-CM | POA: Diagnosis not present

## 2016-08-15 DIAGNOSIS — Z79899 Other long term (current) drug therapy: Secondary | ICD-10-CM | POA: Diagnosis not present

## 2016-08-15 DIAGNOSIS — Z7901 Long term (current) use of anticoagulants: Secondary | ICD-10-CM | POA: Diagnosis not present

## 2016-08-15 DIAGNOSIS — E039 Hypothyroidism, unspecified: Secondary | ICD-10-CM | POA: Diagnosis not present

## 2016-08-15 DIAGNOSIS — R05 Cough: Secondary | ICD-10-CM | POA: Diagnosis not present

## 2016-08-15 DIAGNOSIS — Z8571 Personal history of Hodgkin lymphoma: Secondary | ICD-10-CM | POA: Diagnosis not present

## 2016-08-15 DIAGNOSIS — I5032 Chronic diastolic (congestive) heart failure: Secondary | ICD-10-CM | POA: Diagnosis not present

## 2016-08-15 DIAGNOSIS — Z8674 Personal history of sudden cardiac arrest: Secondary | ICD-10-CM | POA: Diagnosis not present

## 2016-08-15 NOTE — Care Management Obs Status (Signed)
[facility] NOTIFICATION   Patient Details  Name: [name] MRN: [id number] Date of Birth: Apr 10, 1946   Medicare Observation Status Notification Given:  Yes    Royston Bake, RN 08/15/2016, 3:31 PM

## 2016-08-15 NOTE — Progress Notes (Signed)
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 Previously treated with flecainide. Admitted 5/14 with pulmonary edema in the setting of hypertensive crisis complicated by PEA arrest. She was switched to dofetilide. Admitted with palpitations a/w weakness, fatigue, dizziness the last 3 days - found to have BP 192/102 and going in/out of recurrent AF with rate in the 150s. She has been recovering from a URI - CXR with haziness. CHADSVASC = 5, on Xarelto.  Previous studies: LHC 5/05 LAD 30% ETT 12/13 - normal - no evidence of ischemia by ST analysis 2D echo 08/2015: EF 50-55%, HK of apical anteroseptal MC, mod MR, PASP 32. Carotid duplex 05/2016: 87-56% RICA, 43-32% LICA  Subjective   No further AF since 2am. Reports palpitations a/w fatigue, dizziness. No CP. No syncope.  Recent URI 8/4-8/14, treated symptomatically with Coricidin HBP. Mild residual nasal congestion. CXR with question hazy abnormality.   Inpatient Medications    . carvedilol  12.5 mg Oral BID WC  . dofetilide  250 mcg Oral BID  . levothyroxine  100 mcg Oral QAC breakfast  . magnesium oxide  400 mg Oral BID  . rivaroxaban  20 mg Oral Q supper  . sodium chloride flush  3 mL Intravenous Q12H    Vital Signs    Vitals:   08/14/16 2225 08/15/16 0110 08/15/16 0605 08/15/16 0915  BP: (!) 170/79 (!) 150/71 (!) 138/52 (!) 109/49  Pulse: 77 75 73 64    Resp: '16 16 16 16  '$ Temp: 98 F (36.7 C) 98.1 F (36.7 C) 98.2 F (36.8 C) 97.8 F (36.6 C)  TempSrc: Oral Oral Oral Oral  SpO2: 95% 95% 95% 96%  Weight: 137 lb 1.6 oz (62.2 kg)  136 lb 7.4 oz (61.9 kg)   Height: '5\' 2"'$  (1.575 m)       Intake/Output Summary (Last 24 hours) at 08/15/16 0957 Last data filed at 08/15/16 0912  Gross per 24 hour  Intake              720 ml  Output              400 ml  Net              320 ml   Filed Weights   08/14/16 1922 08/14/16 2225 08/15/16 0605  Weight: 138 lb (62.6 kg) 137 lb 1.6 oz (62.2 kg) 136 lb 7.4 oz (61.9 kg)    Physical Exam    General: Well developed thin WF in no acute distress.  Head: Normocephalic, atraumatic, sclera non-icteric, no xanthomas, nares are without discharge. Neck: JVP not elevated. Lungs: Clear bilaterally to auscultation without wheezes, rales, or rhonchi. Breathing is unlabored. Heart: RRR S1 S2 without murmurs, rubs, or gallops.  Abdomen: Soft, non-tender, non-distended with normoactive bowel sounds. No rebound/guarding. Extremities: No clubbing or cyanosis. No edema. Distal pedal pulses are 2+ and equal bilaterally. Neuro: Alert and oriented X 3. Moves all extremities  spontaneously. Psych:  Responds to questions appropriately with a normal affect.   Labs    CBC  Recent Labs  08/14/16 2043  WBC 10.6*  HGB 13.8  HCT 42.3  MCV 93.4  PLT 283   Basic Metabolic Panel  Recent Labs  08/14/16 2043  NA 135  K 3.9  CL 104  CO2 23  GLUCOSE 118*  BUN 25*  CREATININE 0.87  CALCIUM 8.7*  MG 2.2   Cardiac Enzymes  Recent Labs  08/14/16 2043  TROPONINI <0.03   Thyroid Function Tests  Recent Labs  08/14/16 2043  TSH 2.038    Telemetry    NSR with brief bursts of irregular WCT, appears to be afib based on irregularity and similar morphology to sinus rate  ECG    NSR 66bpm 440m, nonspecific changes  Radiology    Dg Chest Portable 1 View  Result Date: 08/14/2016 CLINICAL DATA:   Heart problems EXAM: PORTABLE CHEST 1 VIEW COMPARISON:  04/17/2015 FINDINGS: 2020 hours. Haziness overlying the lung bases likely related to superimposition of soft tissues, but pneumonia in either lower lung cannot be completely excluded. Cardiopericardial silhouette is at upper limits of normal for size. Bones are diffusely demineralized. Telemetry leads overlie the chest. IMPRESSION: Superimposition of soft tissues with hazy opacity overlying the lung bases. Basilar pneumonia not excluded. Dedicated upright PA and lateral film recommended when the patient is able. Electronically Signed   By: EMisty StanleyM.D.   On: 08/14/2016 20:51    Assessment & Plan    1. Paroxysmal atrial fib - currently on Tikosyn 2536m BID which has worked well for her since 2014 up until the last several days; only brief paroxysms intermittently before then. She is at this dose due to a) history of QT prolongation. QTC 4964mhis AM in setting of LBBB which is stable from prior (was 517 on adm) and also has CrCl of 52, warranting this dose. No further AF since uptitration of carvedilol. May be prudent to observe another day on present regimen given decrease in BP. If she has recurrent AF, would benefit from EP consultation for further input.  2. Recent URI - CXR with questionable haze. Patient reports in the past when she's had PNA it's been a very atypical presentation. WBC mildly elevated. Will repeat 2V CXR this AM.  3. Labile HTN - markedly elevated on admission, fell with increased carvedilol dose. Will hold losartan and follow for now.  4. Chronic diastolic CHF - appears euvolemic. Do not appreciate any heart failure on exam.  Signed, DayCharlie PitterA-C  08/15/2016, 9:57 AM   History and all data above reviewed.  Patient examined.  I agree with the findings as above.  The patient exam reveals COR:RRR  ,  Lungs: Clear  ,  Abd: Positive bowel sounds, no rebound no guarding, Ext No edema  .  All available labs,  radiology testing, previous records reviewed. Agree with documented assessment and plan. Atrial fib:  Improved on increased beta blocker.  Observe and ambulate this evening and possibly home in AM.   JamSeltzerchrein  11:56 AM  08/15/2016

## 2016-08-15 NOTE — Progress Notes (Signed)
Pt refused bed alarm 

## 2016-08-16 DIAGNOSIS — I480 Paroxysmal atrial fibrillation: Secondary | ICD-10-CM | POA: Diagnosis not present

## 2016-08-16 LAB — CYTOLOGY - PAP

## 2016-08-16 MED ORDER — CARVEDILOL 6.25 MG PO TABS: 12.5000 mg | ORAL_TABLET | Freq: Two times a day (BID) | ORAL | 3 refills | Status: DC

## 2016-08-16 NOTE — Progress Notes (Signed)
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 Previously treated with flecainide. Admitted 5/14 with pulmonary edema in the setting of hypertensive crisis complicated by PEA arrest. She was switched to dofetilide. Admitted with palpitations a/w weakness, fatigue, dizziness the last 3 days - found to have BP 192/102 and going in/out of recurrent AF with rate in the 150s. She has been recovering from a URI - CXR with haziness. CHADSVASC = 5, on Xarelto.  Previous studies: LHC 5/05 LAD 30% ETT 12/13 - normal - no evidence of ischemia by ST analysis 2D echo 08/2015: EF 50-55%, HK of apical anteroseptal MC, mod MR, PASP 32. Carotid duplex 05/2016: 29-92% RICA, 42-68% LICA  Subjective   She had a quite night and felt well.  One episode of symptomatic palpitations.   Inpatient Medications    . carvedilol  12.5 mg Oral BID WC  . dofetilide  250 mcg Oral BID  . levothyroxine  100 mcg Oral QAC breakfast  . magnesium oxide  400 mg Oral BID  . rivaroxaban  20 mg Oral Q supper  . sodium chloride flush  3 mL Intravenous Q12H    Vital Signs    Vitals:   08/15/16 1707 08/15/16 2005 08/16/16 0538 08/16/16 0753  BP: (!) 156/69 (!) 103/46 (!) 118/55 (!) 159/68  Pulse: 77 75 66 63  Resp: '18 18 18 18  '$ Temp: 98 F (36.7 C) 98.5 F (36.9 C) 98.9 F (37.2 C)   TempSrc: Oral Oral Oral   SpO2: 94% 92% 96% 92%  Weight:   136  lb 4.8 oz (61.8 kg)   Height:        Intake/Output Summary (Last 24 hours) at 08/16/16 0917 Last data filed at 08/16/16 0753  Gross per 24 hour  Intake              583 ml  Output             3051 ml  Net            -2468 ml   Filed Weights   08/14/16 2225 08/15/16 0605 08/16/16 0538  Weight: 137 lb 1.6 oz (62.2 kg) 136 lb 7.4 oz (61.9 kg) 136 lb 4.8 oz (61.8 kg)    Physical Exam    GEN: Well nourished, well developed, in no no acute distress.  Neck: Supple, no JVD, carotid bruits, or masses. Cardiac: RRR, no murmurs, no rubs, or gallops. No clubbing, cyanosis, no edema.  Radials/DP/PT 2+ and equal bilaterally.  Respiratory:  Respirations  regular and unlabored, clear to auscultation bilaterally. GI: Soft, nontender, nondistended, BS + x 4. Neuro:  Strength and sensation are intact.   Labs    CBC  Recent Labs  08/14/16 2043  WBC 10.6*  HGB 13.8  HCT 42.3  MCV 93.4  PLT 341   Basic Metabolic Panel  Recent Labs  08/14/16 2043  NA 135  K 3.9  CL 104  CO2 23  GLUCOSE 118*  BUN 25*  CREATININE 0.87  CALCIUM 8.7*  MG 2.2   Liver Function Tests No results for input(s): AST, ALT, ALKPHOS, BILITOT, PROT, ALBUMIN in the last 72 hours. No results for input(s): LIPASE, AMYLASE in the last 72 hours. Cardiac Enzymes  Recent Labs  08/14/16 2043  TROPONINI <0.03   BNP Invalid input(s): POCBNP D-Dimer No results for input(s): DDIMER in the last 72 hours. Hemoglobin A1C No results for input(s): HGBA1C in the last 72 hours. Fasting Lipid Panel No results for input(s): CHOL, HDL, LDLCALC, TRIG, CHOLHDL, LDLDIRECT in the last 72 hours. Thyroid Function Tests  Recent Labs  08/14/16 2043  TSH 2.038    Telemetry    Two brief runs of atrial fib.  ECG    NA  Radiology    Dg Chest 2 View  Result Date: 08/15/2016 CLINICAL DATA:  Nonproductive cough. Atrial fibrillation. Personal history of Hodgkin's lymphoma. EXAM: CHEST  2 VIEW COMPARISON:  08/14/2016  and 04/17/2015 FINDINGS: The heart size and mediastinal contours are within normal limits. Surgical clips and post treatment scarring seen in the medial left upper lobe and left lung apex, which are stable since previous studies. No evidence of pulmonary infiltrate or edema. No evidence of pneumothorax or pleural effusion. Aortic atherosclerosis. IMPRESSION: Stable post treatment changes in left hemithorax. No active disease. Aortic atherosclerosis. Electronically Signed   By: Earle Gell M.D.   On: 08/15/2016 12:41   Dg Chest Portable 1 View  Result Date: 08/14/2016 CLINICAL DATA:  Heart problems EXAM: PORTABLE CHEST 1 VIEW COMPARISON:  04/17/2015 FINDINGS: 2020 hours. Haziness overlying the lung bases likely related to superimposition of soft tissues, but pneumonia in either lower lung cannot be completely excluded. Cardiopericardial silhouette is at upper limits of normal for size. Bones are diffusely demineralized. Telemetry leads overlie the chest. IMPRESSION: Superimposition of soft tissues with hazy opacity overlying the lung bases. Basilar pneumonia not excluded. Dedicated upright PA and lateral film recommended when the patient is able. Electronically Signed   By: Misty Stanley M.D.   On: 08/14/2016 20:51    Assessment & Plan    Paroxysmal atrial fib -   Better on increased Coreg.  Send home with this.  Otherwise no change in therapy.   Recent URI -  No evidence of pneumonia on CXR yesterday.   Labile HTN -   BP is somewhat labile.  However, beta blocker increased.  Continue current therapy and she can follow this at home.   Chronic diastolic CHF - Appears euvolemic.  Continue current therapy.   Signed, Minus Breeding, MD  08/16/2016, 9:17 AM

## 2016-08-16 NOTE — Consult Note (Signed)
   [facility] Inpatient Consult   08/16/2016  [name] 1946-04-13 [id number]  Patient screened for potential [facility] Management services. Patient is eligible for [facility] Care Management services under patient's Medicare  Plan.  Chart review reveals the patient is a 70 yo female with PMH of NICM with improved EF, diastolic HF, HTN, LBBB, hypothyroidism, Hodgkin's disease, breast CA, PAF, carotid stenosis. Patient underwent radiation therapy for Hodgkin's lymphoma in 1998, mastectomy for breast cancer in 2009 and recurrent breast CA in 2013. EF has been as low as 45% in the past. This is improved to normal over time.  Patient has been assigned EMMI HF follow up calls by inpatient RNCM. Met with the patient at the bedside to explain the program and a brochure was provided with contact information. Patient endorses Dr. [name] as her primary care provider.  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  Please call for questions and contact:   Natividad Brood, RN BSN [facility] Liaison  [phone] business mobile phone Toll free office [phone]

## 2016-08-16 NOTE — Progress Notes (Signed)
Pt has orders to be discharged. Discharge instructions given and pt has no additional questions at this time. Medication regimen reviewed and pt educated. Pt verbalized understanding and has no additional questions. Telemetry box removed. IV removed and site in good condition. Pt stable and waiting for transportation.  Jeremy Mclamb RN 

## 2016-08-16 NOTE — Discharge Summary (Signed)
Discharge Summary    Patient ID: [name],  MRN: [id number], DOB/AGE: [birth year] 70 y.o.  Admit date: 08/14/2016 Discharge date: 08/16/2016  Primary Care Provider: [name] R Primary Cardiologist: Dr. Johnsie Cancel  Discharge Diagnoses    Principal Problem:   PAF (paroxysmal atrial fibrillation) [facility]) Active Problems:   Hypothyroidism   Essential hypertension   LBBB (left bundle branch block)   Chronic diastolic CHF (congestive heart failure) (HCC)   NICM (nonischemic cardiomyopathy) (HCC)   Allergies Allergies  Allergen Reactions  . Ace Inhibitors     Cough  . Benazepril     cough  . Contrast Media [Iodinated Diagnostic Agents]     Sneezing   . Epinephrine     REACTION: BUZZ; INCREASED HEART RATE  . Iohexol      Desc: SNEEZING, pt states this happened in 1988. Has had iv contrast since without being premedicated and has had no problems. Chest ct 2005- [place] 05/09/12   . Potassium-Containing Compounds     Projectile vomiting  . Prednisolone Other (See Comments)    ARRYTHMIAS  . Taxotere [Docetaxel] Other (See Comments)    Fluid overload    Diagnostic Studies/Procedures    None _____________   History of Present Illness     70 yo female with PMH of NICM with improved EF, diastolic HF, HTN, LBBB, hypothyroidism, Hodgkin's disease, breast CA, PAF, carotid stenosis. Patient underwent radiation therapy for Hodgkin's lymphoma in 1998, mastectomy for breast cancer in 2009 and recurrent breast CA in 2013. EF has been as low as 45% in the past. This is improved to normal over time. Flecainide was started in 12/13 for recurrent A. fib. ETT was negative for proarrhythmia. She was admitted in 5/14 with pulmonary edema in the setting of hypertensive crisis complicated by PEA arrest. She was switched to dofetilide. She was seen by Dr. Johnsie Cancel 9/16 when her lasix and ARB were held in the setting of low blood pressure. Echo at that time showed continued normal LV function.  Labs at that time showed a low TSH, and she was to follow up with her PCP.   At her last follow up in th office 6/17 she reported doing well, with no symptoms at home. She asked to stop her lasix, so this was done. Planned to follow up with her oncologist within a couple of months.   She presented to the [facility] ED on 08/14/2016 with reports of intermittent palpitations with associated weakness, fatigue, and dizziness for the prior 3 days. Reported to be recovering from recent URI. In the ED she would found to be in and out of atrial fib with rates in the 150s. Also with a BP 192/102. Was treated with a dose of IV lopresser.   Hospital Course     Consultants: none  She was admitted and her BB increased from coreg 6.'25mg'$  BID to 12.'5mg'$  BID. TSH was checked and normal. The following day her telemetry showed no further episodes of AF since 2am that morning. Stated that she had been using coricidin HBP for her URI symptoms at home. Chest x-ray showed haxy abnormality. Blood pressure improved with the increase of her BB. She was observed for an additional night on telemetry.   On 08/16/2016 she telemetry was reviewed showing 2 episodes of atrial fibrillation, only one episode of symptomatic palpitations. Did receive a repeat chest x-ray the day before as there was concern for PNA, but showed no evidence of such. Was euvolemic on exam.  She was seen and assessed by Dr. Percival Spanish and determined stable for discharge home. I have reviewed the changes in her medications with her prior to discharge and arranged for follow up in the office. Of note her last lipid panel was noted from 2014, but she reports this is followed by her PCP with [facility], and she reports no current statin because her values have been better.  _____________  Discharge Vitals Blood pressure (!) 159/68, pulse 63, temperature 98.9 F (37.2 C), temperature source Oral, resp. rate 18, height '5\' 2"'$  (1.575 m), weight 136 lb 4.8 oz (61.8 kg), last  menstrual period 03/08/1995, SpO2 92 %.  Filed Weights   08/14/16 2225 08/15/16 0605 08/16/16 0538  Weight: 137 lb 1.6 oz (62.2 kg) 136 lb 7.4 oz (61.9 kg) 136 lb 4.8 oz (61.8 kg)    Labs & Radiologic Studies    CBC  Recent Labs  08/14/16 2043  WBC 10.6*  HGB 13.8  HCT 42.3  MCV 93.4  PLT 416   Basic Metabolic Panel  Recent Labs  08/14/16 2043  NA 135  K 3.9  CL 104  CO2 23  GLUCOSE 118*  BUN 25*  CREATININE 0.87  CALCIUM 8.7*  MG 2.2   Liver Function Tests No results for input(s): AST, ALT, ALKPHOS, BILITOT, PROT, ALBUMIN in the last 72 hours. No results for input(s): LIPASE, AMYLASE in the last 72 hours. Cardiac Enzymes  Recent Labs  08/14/16 2043  TROPONINI <0.03   BNP Invalid input(s): POCBNP D-Dimer No results for input(s): DDIMER in the last 72 hours. Hemoglobin A1C No results for input(s): HGBA1C in the last 72 hours. Fasting Lipid Panel No results for input(s): CHOL, HDL, LDLCALC, TRIG, CHOLHDL, LDLDIRECT in the last 72 hours. Thyroid Function Tests  Recent Labs  08/14/16 2043  TSH 2.038   _____________  Dg Chest 2 View  Result Date: 08/15/2016 CLINICAL DATA:  Nonproductive cough. Atrial fibrillation. Personal history of Hodgkin's lymphoma. EXAM: CHEST  2 VIEW COMPARISON:  08/14/2016 and 04/17/2015 FINDINGS: The heart size and mediastinal contours are within normal limits. Surgical clips and post treatment scarring seen in the medial left upper lobe and left lung apex, which are stable since previous studies. No evidence of pulmonary infiltrate or edema. No evidence of pneumothorax or pleural effusion. Aortic atherosclerosis. IMPRESSION: Stable post treatment changes in left hemithorax. No active disease. Aortic atherosclerosis. Electronically Signed   By: Earle Gell M.D.   On: 08/15/2016 12:41   Dg Chest Portable 1 View  Result Date: 08/14/2016 CLINICAL DATA:  Heart problems EXAM: PORTABLE CHEST 1 VIEW COMPARISON:  04/17/2015 FINDINGS: 2020  hours. Haziness overlying the lung bases likely related to superimposition of soft tissues, but pneumonia in either lower lung cannot be completely excluded. Cardiopericardial silhouette is at upper limits of normal for size. Bones are diffusely demineralized. Telemetry leads overlie the chest. IMPRESSION: Superimposition of soft tissues with hazy opacity overlying the lung bases. Basilar pneumonia not excluded. Dedicated upright PA and lateral film recommended when the patient is able. Electronically Signed   By: Misty Stanley M.D.   On: 08/14/2016 20:51   Disposition   Pt is being discharged home today in good condition.  Follow-up Plans & Appointments    Follow-up Information    Richardson Dopp, PA-C Follow up on 08/31/2016.   Specialties:  Cardiology, Physician Assistant Why:  at 1:45pm for your hospital follow up with Dr. Kyla Balzarine PA Nicki Reaper.  Contact information: [address]  [address] [phone]          Discharge Instructions    (HEART FAILURE PATIENTS) Call MD:  Anytime you have any of the following symptoms: 1) 3 pound weight gain in 24 hours or 5 pounds in 1 week 2) shortness of breath, with or without a dry hacking cough 3) swelling in the hands, feet or stomach 4) if you have to sleep on extra pillows at night in order to breathe.    Complete by:  As directed   Call MD for:  extreme fatigue    Complete by:  As directed   Call MD for:  persistant dizziness or light-headedness    Complete by:  As directed   Diet - low sodium heart healthy    Complete by:  As directed   Discharge instructions    Complete by:  As directed   Please increase your coreg from 6.'25mg'$  twice a day to 12.'5mg'$  twice a day and monitor your blood pressure at home. Please remember to bring your medications with you to your follow up appt.   Increase activity slowly    Complete by:  As directed      Discharge Medications   Current Discharge Medication List    CONTINUE these  medications which have CHANGED   Details  carvedilol (COREG) 6.25 MG tablet Take 2 tablets (12.5 mg total) by mouth 2 (two) times daily with a meal. Qty: 180 tablet, Refills: 3      CONTINUE these medications which have NOT CHANGED   Details  calcium carbonate (OS-CAL) 600 MG TABS Take 600 mg by mouth at bedtime.     Cholecalciferol (VITAMIN D) 2000 UNITS CAPS Take 1 capsule by mouth daily.     dofetilide (TIKOSYN) 250 MCG capsule TAKE 1 CAPSULE(250 MCG) BY MOUTH EVERY 12 HOURS Qty: 180 capsule, Refills: 2    levothyroxine (SYNTHROID, LEVOTHROID) 100 MCG tablet Take 100 mcg by mouth daily before breakfast.    losartan (COZAAR) 25 MG tablet TAKE 1 TABLET BY MOUTH DAILY Qty: 90 tablet, Refills: 2    LUTEIN-ZEAXANTHIN PO Take 1 capsule by mouth every morning.    magnesium oxide (MAG-OX 400) 400 MG tablet Take 400 mg by mouth 2 (two) times daily.     Multiple Vitamin (MULTIVITAMIN) tablet Take 1 tablet by mouth every morning. "Natural Vitamin"    naproxen sodium (ALEVE) 220 MG tablet Take 220 mg by mouth 2 (two) times daily as needed (for mild pain).    !! OVER THE COUNTER MEDICATION Osteodenz Capsules: Take 1 capsule by mouth at bedtime    !! OVER THE COUNTER MEDICATION Vegetarian Omega Green: Take 3 capsules by mouth daily    !! OVER THE COUNTER MEDICATION Mental Clarity: Take 1 capsule by mouth every morning    XARELTO 20 MG TABS tablet TAKE 1 TABLET BY MOUTH DAILY WITH DINNER Qty: 90 tablet, Refills: 3    ketorolac (ACULAR) 0.4 % SOLN Use as directed/Pre-op    ofloxacin (OCUFLOX) 0.3 % ophthalmic solution Use as directed/Pre-op    prednisoLONE acetate (PRED FORTE) 1 % ophthalmic suspension Use as directed/Pre-op     !! - Potential duplicate medications found. Please discuss with provider.        Outstanding Labs/Studies   None  Duration of Discharge Encounter   Greater than 30 minutes including physician time.  Signed, Reino Bellis NP-C 08/16/2016, 10:53  AM

## 2016-08-23 ENCOUNTER — Encounter: Payer: Self-pay | Admitting: Physician Assistant

## 2016-08-23 NOTE — Progress Notes (Signed)
Cardiology Office Note:    Date:  08/24/2016   ID:  [name], DOB 1946-05-13, MRN [id number]  PCP:  Patricia Huh, MD  Cardiologist:  Dr. Jenkins [name]   Electrophysiologist:  n/a Ophthalmologist: Dr. Katy [name] (phone [phone]; fax [phone])  Referring MD: Patricia Arabian, MD   Chief Complaint  Patient presents with  . Atrial Fibrillation    Hospital follow up    History of Present Illness:    [name] is a 70 y.o. Patricia with a hx of NICM with improved EF, diastolic HF, HTN, LBBB, hypothyroidism, Hodgkin's disease, breast CA, PAF, carotid artery disease. Patient underwent radiation therapy for Hodgkin's lymphoma in 1998, mastectomy for breast cancer in 2009 and recurrent breast CA in 2013. EF has been as low as 45% in the past. This has improved to normal over time. Flecainide was started in 12/13 for recurrent A. fib. ETT was negative for proarrhythmia. She was admitted in 5/14 with pulmonary edema in the setting of hypertensive crisis complicated by PEA arrest. She was switched to dofetilide.  Last seen in clinic by [name] 6/17.   Admitted 8/27-8/29 intermittent atrial fibrillation with RVR. Blood pressure was also markedly elevated. Beta blocker dose was increased (Coreg 6.25 >> 12.5).  There were no further episodes of atrial fibrillation noted. She returns for close follow-up. She is here alone. She had a couple of episodes of palpitations after discharge. She had no rapid palpitations like she did with atrial fibrillation. She has had no symptoms in the past 3 days. She brings in a list of her blood pressures. All of her readings are optimal aside from 2 readings which were elevated. She denies chest pain, shortness of breath, syncope, orthopnea, PND or edema.   Prior CV studies that were reviewed today include:     Carotid US 6/17 Stable RICA velocities, now in 40-59% range. Stable LICA velocities, now in 60-79% range of stenosis. F/U 6 mos.  Echo  09/03/15 EF 50-55%, apical anteroseptal HK, trivial AI, MAC, moderate MR, PASP 32 mmHg, trivial effusion  Carotid US 4/16 R 40-59%; L 60-79%  ETT 12/13 normal - no evidence of ischemia by ST analysis  LHC 5/05 LAD 30%  Past Medical History:  Diagnosis Date  . Anemia   . Arthritis    fingers   . Atrial fibrillation ([place])    dx in 2008;  CHADS2-VASc=5;  changed to Tikosyn during admx 05/2013; Xarelto started 05/2013  . Blood transfusion    1968  . Breast cancer ([place]) 2009   T1N0M0 DCIS right breast  . Breast cancer ([place]) 2013   a. reocurrence right breast;  b. s/p Taxotere, Cytoxan and XRT  . Carotid stenosis    a. dopplers 0/93:  RICA 8-18%, LICA 29-93% => repeat due in 01/2013;  b.  Carotid US (7/16): RICA 9-67%; LICA 89-38% - f/u 6 mos  . History of radiation therapy 07/10/12-08/13/12   right breast/chest wall  . Hodgkin's disease    stage 2b;  s/p XRT 1988  . HTN (hypertension)   . Hypothyroidism    2/2 XRT for Hodgkins Lymphoma  . LBBB (left bundle branch block)   . Mucositis 04/29/2012  . NICM (nonischemic cardiomyopathy) ([place])    a. EF as low as 45%;  b. LHC 5/05:  LM 10%, pLAD 30%, EF 45-50%,   c. Echo 5/13:  EF 60-65%, Gr 1 diast dysfn, MAC;   d.  Echo 11/13:  EF 60-65%, Gr 1 diast dysfn, Tr  AI, mild MR;  d.  Echo 05/10/13: EF 39-76%, grade 1 diastolic dysfunction, PASP 36, small effusion.  . PEA (Pulseless electrical activity) ([place])    a. admx 05/2013 for APE in setting of HTN emergency c/b PEA arrest and asp pneumonia, VDRF, AFib with RVR  . Raynaud's disease   . Status post radiation therapy 1988   mantle and periaortic     Past Surgical History:  Procedure Laterality Date  . BREAST LUMPECTOMY  2009   right, lymph node biopsy  . BTL  1971  . DILATION AND CURETTAGE OF UTERUS    . MASS EXCISION  03/28/2012   Procedure: EXCISION MASS;  Surgeon: Edward Jolly, MD;  Location: [facility];  Service: General;  Laterality: Right;  Excision right  chest wall mass  . MASTECTOMY  2009   right breast  . POLYPECTOMY  1990   D&C (also in 1977 and [place])   . PORT-A-CATH REMOVAL  10/03/2012   Procedure: REMOVAL PORT-A-CATH;  Surgeon: Edward Jolly, MD;  Location: [facility];  Service: General;  Laterality: N/A;  . PORTACATH PLACEMENT  04/16/2012   Procedure: INSERTION PORT-A-CATH;  Surgeon: Edward Jolly, MD;  Location: [facility];  Service: General;  Laterality: Left;  Placement of Port-a-Cath, left subclavian  . R vein ligation & stripping  1976  . Spleenectomy  1988   for Hodgkin's disease  . Thoractomy  1968  . TISSUE EXPANDER  REMOVAL W/ REPLACEMENT OF IMPLANT    . TISSUE EXPANDER PLACEMENT     right breast  . TONSILLECTOMY AND ADENOIDECTOMY  1965    Current Medications: Outpatient Medications Prior to Visit  Medication Sig Dispense Refill  . calcium carbonate (OS-CAL) 600 MG TABS Take 600 mg by mouth at bedtime.     . carvedilol (COREG) 6.25 MG tablet Take 2 tablets (12.5 mg total) by mouth 2 (two) times daily with a meal. 180 tablet 3  . Cholecalciferol (VITAMIN D) 2000 UNITS CAPS Take 1 capsule by mouth daily.     Marland Kitchen dofetilide (TIKOSYN) 250 MCG capsule TAKE 1 CAPSULE(250 MCG) BY MOUTH EVERY 12 HOURS 180 capsule 2  . ketorolac (ACULAR) 0.4 % SOLN Use as directed/Pre-op    . levothyroxine (SYNTHROID, LEVOTHROID) 100 MCG tablet Take 100 mcg by mouth daily before breakfast.    . losartan (COZAAR) 25 MG tablet TAKE 1 TABLET BY MOUTH DAILY 90 tablet 2  . LUTEIN-ZEAXANTHIN PO Take 1 capsule by mouth every morning.    . magnesium oxide (MAG-OX 400) 400 MG tablet Take 400 mg by mouth 2 (two) times daily.     . Multiple Vitamin (MULTIVITAMIN) tablet Take 1 tablet by mouth every morning. "Natural Vitamin"    . naproxen sodium (ALEVE) 220 MG tablet Take 220 mg by mouth 2 (two) times daily as needed (for mild pain).    Marland Kitchen ofloxacin (OCUFLOX) 0.3 % ophthalmic solution Use as directed/Pre-op    . OVER THE COUNTER MEDICATION Osteodenz  Capsules: Take 1 capsule by mouth at bedtime    . OVER THE COUNTER MEDICATION Vegetarian Omega Green: Take 3 capsules by mouth daily    . OVER THE COUNTER MEDICATION Mental Clarity: Take 1 capsule by mouth every morning    . prednisoLONE acetate (PRED FORTE) 1 % ophthalmic suspension Use as directed/Pre-op    . XARELTO 20 MG TABS tablet TAKE 1 TABLET BY MOUTH DAILY WITH DINNER 90 tablet 3   No facility-administered medications prior to visit.  Allergies:   Ace inhibitors; Benazepril; Contrast media [iodinated diagnostic agents]; Epinephrine; Iohexol; Potassium-containing compounds; Prednisolone; and Taxotere [docetaxel]   Social History   Social History  . Marital status: Married    Spouse name: Donnie  . Number of children: N/A  . Years of education: N/A   Occupational History  . [employer]   Social History Main Topics  . Smoking status: Never Smoker  . Smokeless tobacco: Never Used  . Alcohol use No  . Drug use: No  . Sexual activity: Not Currently     Comment: post menopausal, HRT x 8 yrs   Other Topics Concern  . None   Social History Narrative   Regular exercise. Retired and married.      Family History:  The patient's family history includes Hypertension in her mother.   ROS:   Please see the history of present illness.    Review of Systems  Constitution: Positive for malaise/fatigue.  Cardiovascular: Positive for dyspnea on exertion and irregular heartbeat.  Respiratory: Positive for shortness of breath.    All other systems reviewed and are negative.   EKGs/Labs/Other Test Reviewed:    EKG:  EKG is  ordered today.  The ekg ordered today demonstrates NSR, HR 64, LAD, IVCD, QTc 476 ms, no sig change  Recent Labs: 10/08/2015: ALT 16 08/14/2016: BUN 25; Creatinine, Ser 0.87; Hemoglobin 13.8; Magnesium 2.2; Platelets 306; Potassium 3.9; Sodium 135; TSH 2.038   Recent Lipid Panel    Component Value Date/Time   CHOL 163 05/18/2013  0440   TRIG 175 (H) 05/18/2013 0440   HDL 26 (L) 05/18/2013 0440   CHOLHDL 6.3 05/18/2013 0440   VLDL 35 05/18/2013 0440   LDLCALC 102 (H) 05/18/2013 0440     Physical Exam:    VS:  BP 140/72   Pulse 62   Ht '5\' 2"'$  (1.575 m)   Wt 136 lb 12.8 oz (62.1 kg)   LMP 03/08/1995   SpO2 95%   BMI 25.02 kg/m     Wt Readings from Last 3 Encounters:  08/24/16 136 lb 12.8 oz (62.1 kg)  08/16/16 136 lb 4.8 oz (61.8 kg)  05/23/16 138 lb 12.8 oz (63 kg)     Physical Exam  Constitutional: She is oriented to person, place, and time. She appears well-developed and well-nourished. No distress.  HENT:  Head: Normocephalic and atraumatic.  Eyes: No scleral icterus.  Neck: No JVD present.  Cardiovascular: Normal rate, regular rhythm and normal heart sounds.   No murmur heard. Pulmonary/Chest: Effort normal. She has no wheezes. She has no rales.  Abdominal: Soft. There is no tenderness.  Musculoskeletal: She exhibits no edema.  Neurological: She is alert and oriented to person, place, and time.  Skin: Skin is warm and dry.  Psychiatric: She has a normal mood and affect.    ASSESSMENT:    1. PAF (paroxysmal atrial fibrillation) ([place])   2. Bilateral carotid artery disease ([place])   3. Chronic diastolic heart failure ([place])   4. Essential hypertension   5. Preoperative cardiovascular examination    PLAN:    In order of problems listed above:  1. PAF - She is maintaining NSR.  She remains on Tikosyn, Xarelto.  Recent labs with acceptable K+, Mg2+.  TSH also normal recently. I have asked her to call if she has recurrent symptoms of palpitations.  I would rec an Event Monitor at that point. If she fails Tikosyn, consider Amiodarone or referral to AFib Clinic.  2. Carotid artery disease - FU due in 11/2016.  I am not sure why she is not on statin Rx.  Will FU on this.    3. Chronic Diastolic CHF:  Volume stable.     4. HTN:  Controlled. If BP goes above target, would increase Losartan.      5. Surgical clearance - She needs cataract surgery next week.  The patient does not have any unstable cardiac conditions.  Upon evaluation today, she can achieve 4 METs or greater without anginal symptoms.  According to [facility] and AHA guidelines, she requires no further cardiac workup prior to her noncardiac surgery and should be at acceptable risk.  Our service is available as necessary in the perioperative period.  She should be able to remain on Xarelto during her procedure given low bleeding risk. If she cannot, since she did not require cardioversion, it is ok to hold her Xarelto for 2 days before her surgery and resume it after when safe.     Medication Adjustments/Labs and Tests Ordered: Current medicines are reviewed at length with the patient today.  Concerns regarding medicines are outlined above.  Medication changes, Labs and Tests ordered today are outlined in the Patient Instructions noted below. Patient Instructions  Medication Instructions:  Your physician recommends that you continue on your current medications as directed. Please refer to the Current Medication list given to you today.   Labwork: None ordered  Testing/Procedures: None ordered  Follow-Up: Your physician recommends that you schedule a follow-up appointment in: 3 months with PatriciaNishan  Any Other Special Instructions Will Be Listed Below (If Applicable). If you need a refill on your cardiac medications before your next appointment, please call your pharmacy.  Signed, Richardson Dopp, PA-C  08/24/2016 12:02 PM    [facility] [address], [place], [place]  [address] Phone: [phone]; Fax: [phone]

## 2016-08-24 ENCOUNTER — Telehealth: Payer: Self-pay | Admitting: Physician Assistant

## 2016-08-24 ENCOUNTER — Ambulatory Visit (INDEPENDENT_AMBULATORY_CARE_PROVIDER_SITE_OTHER): Payer: Medicare Other | Admitting: Physician Assistant

## 2016-08-24 ENCOUNTER — Encounter: Payer: Self-pay | Admitting: Physician Assistant

## 2016-08-24 VITALS — BP 140/72 | HR 62 | Ht 62.0 in | Wt 136.8 lb

## 2016-08-24 DIAGNOSIS — I10 Essential (primary) hypertension: Secondary | ICD-10-CM

## 2016-08-24 DIAGNOSIS — I480 Paroxysmal atrial fibrillation: Secondary | ICD-10-CM | POA: Diagnosis not present

## 2016-08-24 DIAGNOSIS — I739 Peripheral vascular disease, unspecified: Secondary | ICD-10-CM

## 2016-08-24 DIAGNOSIS — I779 Disorder of arteries and arterioles, unspecified: Secondary | ICD-10-CM

## 2016-08-24 DIAGNOSIS — I5032 Chronic diastolic (congestive) heart failure: Secondary | ICD-10-CM

## 2016-08-24 DIAGNOSIS — Z01810 Encounter for preprocedural cardiovascular examination: Secondary | ICD-10-CM

## 2016-08-24 DIAGNOSIS — I6523 Occlusion and stenosis of bilateral carotid arteries: Secondary | ICD-10-CM

## 2016-08-24 DIAGNOSIS — I6529 Occlusion and stenosis of unspecified carotid artery: Secondary | ICD-10-CM

## 2016-08-24 NOTE — Patient Instructions (Addendum)
Medication Instructions:  Your physician recommends that you continue on your current medications as directed. Please refer to the Current Medication list given to you today.   Labwork: None ordered  Testing/Procedures: None ordered  Follow-Up: Your physician recommends that you schedule a follow-up appointment in: 3 months with Dr.Nishan  Any Other Special Instructions Will Be Listed Below (If Applicable). If you need a refill on your cardiac medications before your next appointment, please call your pharmacy.

## 2016-08-24 NOTE — Telephone Encounter (Signed)
Spoke with pt. Pt aware of Richardson Dopp, [place] recommendation for her to start Atorvastatin '40mg'$  daily. Pt sts that she has not been on statin therapy in the past, but hesitant to start because of the side effects. Pt sts that she has her lipids checked at her pcp yearly and they have always been ok. Adv pt that from a cardiovascular standpoint our goals for lipid values are more aggressive. Adv pt the benefit of starting a statin, to help slow the progression of her carotid disease. Pt sts that she would like to have her lipid labs drawn prior to starting, she's not sure she wants to be on a statin drug. Adv pt that I will fwd her concerns to Richardson Dopp, PA and we will call  Back with his response. Pt voiced appreciation and verbalized understanding.

## 2016-08-24 NOTE — Telephone Encounter (Signed)
Ok to get fasting Lipids at her convenience. Richardson Dopp, PA-C   08/24/2016 4:13 PM

## 2016-08-24 NOTE — Telephone Encounter (Signed)
Please ask patient if she has ever tried to take a statin for cholesterol. With carotid artery disease, she should really take one if she can tolerate it. If she has not ever taken one, I would like her to start on Atorvastatin 40 mg QD and check Lipids and LFTs 6 weeks later. Richardson Dopp, PA-C   08/24/2016 1:17 PM

## 2016-08-25 NOTE — Addendum Note (Signed)
Addended by: Lamar Laundry on: 08/25/2016 11:56 AM   Modules accepted: Orders

## 2016-08-25 NOTE — Telephone Encounter (Signed)
Ok [place], [place]   08/25/2016 12:03 PM

## 2016-08-25 NOTE — Telephone Encounter (Signed)
Pt aware of Scott's response. Pt sts that she is  scheduled to have cataract surgery on 9/11. She will call back to schedule her fasting lab appt after she has recovered from surgery. Order in [place] for lipid and lft.

## 2016-08-29 ENCOUNTER — Telehealth: Payer: Self-pay | Admitting: Hematology and Oncology

## 2016-08-29 DIAGNOSIS — H2511 Age-related nuclear cataract, right eye: Secondary | ICD-10-CM | POA: Diagnosis not present

## 2016-08-29 NOTE — Telephone Encounter (Signed)
Returned call from voicemail sent to mailbox. Patient stated she had called several times. Did not reach patient but did leave a voicemail for the patient to please call again to have her appointment reschedule.

## 2016-08-30 ENCOUNTER — Telehealth: Payer: Self-pay | Admitting: Hematology and Oncology

## 2016-08-30 ENCOUNTER — Other Ambulatory Visit: Payer: Self-pay | Admitting: *Deleted

## 2016-08-30 NOTE — Telephone Encounter (Signed)
lvm to inform pt to return call to office to see when to r/s pt appt per staff message and voicemail

## 2016-08-30 NOTE — Patient Outreach (Signed)
[name]) Care Management  08/30/2016  [name] [birth year] [id number]   Subjective: Telephone call to patient's home number, spoke with patient, and HIPAA verified.  Discussed [facility] Care Management [name] program, care management services, and patient voiced understanding.   Patient in agreement to [facility] [name] follow up. Patient states she did not weigh on  08/29/16 due to cataract surgery and forgot to take prior to surgery.   States her weight today is 138 pounds and she has ranged between 136 - 138 pounds since discharge from hospital.  States she can also control weight by decrease the "sweets " intake. States she is currently not  taking fluid pill and is aware to call provider is weight goes up 3 or more pounds in a week or day.  Patient states she does not have a hospital  follow up appointment scheduled with primary MD and will call to make one this week.    States she has has a follow up appointment with cardiologist since hospital discharge.  States she was diagnosed with congestive heart [name] in 2007 as a result of her cancer treatment.   Patient states she does not have any Patricia congestive heart [name], transition of care, care coordination, disease management, disease monitoring, transportation, community resource, or pharmacy needs at this time.  States she is very appreciative of the follow up call, also the automated calls have been very comforting, and she feels like she has nurse calling her everyday.  Patient will continue to receive the EMM Heart [name] automated calls and RNCM follow up calls as needed.   Objective:  Per chart review: Patient hospitalized 08/14/16 - 08/16/16 with  PAF (paroxysmal atrial fibrillation), hypothyroidism, hypertension, LBBB (left bundle branch block), Chronic diastolic CHF (congestive heart [name]), and NICM (nonischemic cardiomyopathy).     Assessment: Received EMM Heart [name] referral on 08/30/16.   Report date: 08/30/16.    Red alert trigger date: 08/29/16.   Patient answered no to the question: Weighed today.     Day #10.   Patricia Red alert follow up completed, no care management needs.   Will proceed with case closure.    Plan: RNCM will send case closure due to [name] follow up completed request to [facility] [place] Management.   Zabrina Brotherton H. Annia Friendly, BSN, [place] Management [facility] Telephonic CM Phone: [phone] Fax: [phone]

## 2016-08-31 ENCOUNTER — Ambulatory Visit: Payer: Medicare Other | Admitting: Physician Assistant

## 2016-09-01 ENCOUNTER — Telehealth: Payer: Self-pay | Admitting: Physician Assistant

## 2016-09-01 NOTE — Telephone Encounter (Signed)
New message    Pt calling about BP issues and want her lasartan doubled. Please call.   Pt c/o BP issue: STAT if pt c/o blurred vision, one-sided weakness or slurred speech  1. What are your last 5 BP readings? In 8 days reading from 140-160 9 times bottom 67 and85  2. Are you having any other symptoms (ex. Dizziness, headache, blurred vision, passed out)? no  3. What is your BP issue? Needs her Lasartan double per her appt with Scott per states the pt

## 2016-09-02 NOTE — Telephone Encounter (Signed)
I s/w pt today in regards to her BP readings. Pt gave me some readings of 08/24/16 @ 7:45 PM 154/71 HR 77; 9/7 8:30 AM 153/80 HR 70; 9/12 8 AM 165/78 HR 70.   I did ask pt if she had taken her BP meds before these readings, Pt said yes, sat down and waited about 5 minutes after taking BP meds before taking BP. I advised pt that this is not enough time for the BP medications to work. I advised pt that if we increased her medications based on these readings thinking she had given ample time for medications to work that we could actually make her BP too low. I advised pt to take her BP meds like she normally does though before taking her BP readings I want her to wait about 1 hour to 1 and 1/2 hours then take BP. Give time for the medication to work. Advised pt I want her to do that this weekend and call me on Monday with some new readings. Advised pt I will also d/w Brynda Rim. PA about our conversation today for further input. Pt thanked me for me time and explaining how to take BP readings.

## 2016-09-02 NOTE — Telephone Encounter (Signed)
[place], [place]   09/02/2016 4:55 PM

## 2016-09-06 DIAGNOSIS — H2512 Age-related nuclear cataract, left eye: Secondary | ICD-10-CM | POA: Diagnosis not present

## 2016-09-07 ENCOUNTER — Other Ambulatory Visit: Payer: Self-pay | Admitting: Cardiovascular Disease

## 2016-09-07 MED ORDER — CARVEDILOL 12.5 MG PO TABS: 12.5000 mg | ORAL_TABLET | Freq: Two times a day (BID) | ORAL | 3 refills | Status: DC

## 2016-09-07 MED ORDER — LOSARTAN POTASSIUM 25 MG PO TABS: 25.0000 mg | ORAL_TABLET | Freq: Two times a day (BID) | ORAL | 3 refills | Status: DC

## 2016-09-07 NOTE — Telephone Encounter (Signed)
Patient calling wanting to increase her Losatarn with her recent BP readings. Patient also wants a refill on her Coreg with the higher dose tablet, so she does not have to double up on her pills.   Consulted [employer] PA, he recommends that she can increase her Losartan to 25 mg by mouth twice daily. Patient is to continue checking her BP, and she is to call our office if BP is > 140/90 or SBP < 110. Patient verbalized understanding.

## 2016-09-07 NOTE — Telephone Encounter (Signed)
°*  STAT* If patient is at the pharmacy, call can be transferred to refill team.    1. Which medications need to be refilled? (please list name of each medication and dose if known)Coreg 12.5   Needs a new prescription sent  2. Which pharmacy/location (including street and city if local pharmacy) is medication to be sent to?[facility] on [address]   3. Do they need a 30 day or 90 day supply? [place]

## 2016-09-07 NOTE — Telephone Encounter (Signed)
Mrs. [name] is calling because she states that she is still having Blood pressure issues and was supposed to receive a call back from [facility]. She is stating everyday at 12pm and 2 hours after she takes it her blood pressure is staying elevated  . On Sunday her bp was 156/82 and on Monday it was 153/101 , Tuesday it was 128/55 and today after 2 hrs it is 123/59 .

## 2016-09-12 ENCOUNTER — Other Ambulatory Visit: Payer: Self-pay

## 2016-09-12 DIAGNOSIS — H2512 Age-related nuclear cataract, left eye: Secondary | ICD-10-CM | POA: Diagnosis not present

## 2016-09-12 NOTE — Patient Outreach (Signed)
[facility]) Care Management  09/12/2016  ELYANAH FARINO 20-Dec-1945 [id number]     EMMI- HF RED ON EMMI ALERT Day # 21 Date: 09/09/16 Red Alert Reason: "weight 140lbs"   Outreach attempt #1 to patient. No answer at present. RN CM left HIPAA compliant voicemail message along with contact info.      Plan: RN CM will make outreach attempt to patient within one business day if no return call from patient.    Enzo Montgomery, RN,BSN,CCM [place] Management Telephonic Care Management Coordinator Direct Phone: [phone] Toll Free: [phone] Fax: [phone]

## 2016-09-13 ENCOUNTER — Other Ambulatory Visit: Payer: Self-pay

## 2016-09-13 NOTE — Patient Outreach (Signed)
[place] [facility]) Care Management  09/13/2016  [name] 25-Apr-1946 [id number]   EMMI- HF RED ON EMMI ALERT Day # 21 Date: 09/09/16 Red Alert Reason: "weight 140lbs"  Day # 24 Date: 09/12/16 Red Alert Reason: "New/worsening problems? Yes"    Outreach attempt to patient. Spoke with patient and addressed red alerts. Patient admits to have increased sodium intake over the past few days. She attributes it to eating out from [employer] and dinner at church. She reports that her weight is back to normal today. Denies any edema, SOB or other symptoms. Reviewed and reinforced with patient low sodium options and making better food choices when eating out. She voiced understanding. patient states she answered yes to new problems as she wanted "to alert staff" that she had cataracts surgery on surgery. She reports that procedure went well. While she was at home recovering last night she forgot that she was not supposed to "turn and look sideways" but she did. She developed some BN&V as a result. She reports that symptoms have since resolved. Patient denies any further issues or concerns. No RN CM needs at this time. Advised patient that she would continue to get automated HF calls to check on her daily. Patient voiced appreciation of calls and follow up calls from nursing staff.   Plan: RN CM will notify [facility] administrative assistant of case closure.   Enzo Montgomery, RN,BSN,CCM [place] Management Telephonic Care Management Coordinator Direct Phone: [phone] Toll Free: [phone] Fax: [phone]

## 2016-10-02 ENCOUNTER — Other Ambulatory Visit: Payer: Self-pay | Admitting: Cardiovascular Disease

## 2016-10-03 NOTE — Telephone Encounter (Signed)
carvedilol (COREG) 12.5 MG tablet  Medication  Date: 09/07/2016 Department: [address] Office Ordering/Authorizing: Liliane Shi, PA-C  Order Providers   Prescribing Provider Encounter Provider  Liliane Shi, PA-C Josue Hector, MD  Medication Detail    Disp Refills Start End   carvedilol (COREG) 12.5 MG tablet 180 tablet 3 09/07/2016    Sig - Route: Take 1 tablet (12.5 mg total) by mouth 2 (two) times daily with a meal. - Oral   E-Prescribing Status: Receipt confirmed by pharmacy (09/07/2016 4:34 PM EDT)   Pharmacy   [id number] - [place], [address] AT [address]

## 2016-10-11 ENCOUNTER — Ambulatory Visit: Payer: Medicare Other | Admitting: Hematology and Oncology

## 2016-10-18 ENCOUNTER — Encounter: Payer: Self-pay | Admitting: Hematology and Oncology

## 2016-10-18 ENCOUNTER — Ambulatory Visit (HOSPITAL_BASED_OUTPATIENT_CLINIC_OR_DEPARTMENT_OTHER): Payer: Medicare Other | Admitting: Hematology and Oncology

## 2016-10-18 DIAGNOSIS — Z170 Estrogen receptor positive status [ER+]: Principal | ICD-10-CM

## 2016-10-18 DIAGNOSIS — Z8571 Personal history of Hodgkin lymphoma: Secondary | ICD-10-CM | POA: Diagnosis not present

## 2016-10-18 DIAGNOSIS — Z853 Personal history of malignant neoplasm of breast: Secondary | ICD-10-CM

## 2016-10-18 DIAGNOSIS — C50911 Malignant neoplasm of unspecified site of right female breast: Secondary | ICD-10-CM

## 2016-10-18 MED ORDER — RED YEAST RICE 600 MG PO CAPS: ORAL_CAPSULE | ORAL | Status: DC

## 2016-10-18 NOTE — Assessment & Plan Note (Signed)
Recurrent right breast triple negative cancer: Originally diagnosed with Hodgkin lymphoma in 03-15-1986 treated with mantle radiation. He developed breast cancer in 03/15/2008 underwent right breast mastectomy for a T1 B. N0 M0 stage IA triple negative breast cancer. In 2012/03/15, patient developed chest wall recurrence that was resected. She is currently being followed by mammograms every year and breast MRIs every other year.  Breast Cancer Surveillance: 1. Breast exam 10/18/2016: Normal 2. Mammogram: Has not been performed yet but this year.  S/P Postsplenectomy.  Hodgkin's lymphoma: Diagnosed 28 years ago and is in remission after mantle radiation   patient's husband died in 2015/03/16 from lung cancer.  Return to clinic in 1 year for follow-up.

## 2016-10-18 NOTE — Progress Notes (Signed)
Patient Care Team: [name], MD as PCP - General  DIAGNOSIS:  Encounter Diagnosis  Name Primary?  . Malignant neoplasm of right breast in female, estrogen receptor positive, unspecified site of breast ([place])     CHIEF COMPLIANT: Surveillance of breast cancer  INTERVAL HISTORY: [name] is a 70 year old with above-mentioned history of recurrent right breast cancer who is currently on surveillance. She reports no new problems or concerns. She had cataract surgeries. She denies any lumps or nodules in breast.  REVIEW OF SYSTEMS:   Constitutional: Denies fevers, chills or abnormal weight loss Eyes: Denies blurriness of vision Ears, nose, mouth, throat, and face: Denies mucositis or sore throat Respiratory: Denies cough, dyspnea or wheezes Cardiovascular: Denies palpitation, chest discomfort Gastrointestinal:  Denies nausea, heartburn or change in bowel habits Skin: Denies abnormal skin rashes Lymphatics: Denies new lymphadenopathy or easy bruising Neurological:Denies numbness, tingling or new weaknesses Behavioral/Psych: Mood is stable, no new changes  Extremities: No lower extremity edema Breast:  denies any pain or lumps or nodules in either breasts All other systems were reviewed with the patient and are negative.  I have reviewed the past medical history, past surgical history, social history and family history with the patient and they are unchanged from previous note.  ALLERGIES:  is allergic to ace inhibitors; benazepril; contrast media [iodinated diagnostic agents]; epinephrine; iohexol; potassium-containing compounds; prednisolone; and taxotere [docetaxel].  MEDICATIONS:  Current Outpatient Prescriptions  Medication Sig Dispense Refill  . calcium carbonate (OS-CAL) 600 MG TABS Take 600 mg by mouth at bedtime.     . carvedilol (COREG) 12.5 MG tablet Take 1 tablet (12.5 mg total) by mouth 2 (two) times daily with a meal. 180 tablet 3  . Cholecalciferol (VITAMIN  D) 2000 UNITS CAPS Take 1 capsule by mouth daily.     Marland Kitchen dofetilide (TIKOSYN) 250 MCG capsule TAKE 1 CAPSULE(250 MCG) BY MOUTH EVERY 12 HOURS 180 capsule 2  . ketorolac (ACULAR) 0.4 % SOLN Use as directed/Pre-op    . levothyroxine (SYNTHROID, LEVOTHROID) 100 MCG tablet Take 100 mcg by mouth daily before breakfast.    . losartan (COZAAR) 25 MG tablet Take 1 tablet (25 mg total) by mouth 2 (two) times daily. 180 tablet 3  . LUTEIN-ZEAXANTHIN PO Take 1 capsule by mouth every morning.    . magnesium oxide (MAG-OX 400) 400 MG tablet Take 400 mg by mouth 2 (two) times daily.     . Multiple Vitamin (MULTIVITAMIN) tablet Take 1 tablet by mouth every morning. "Natural Vitamin"    . naproxen sodium (ALEVE) 220 MG tablet Take 220 mg by mouth 2 (two) times daily as needed (for mild pain).    Marland Kitchen ofloxacin (OCUFLOX) 0.3 % ophthalmic solution Use as directed/Pre-op    . OVER THE COUNTER MEDICATION Osteodenz Capsules: Take 1 capsule by mouth at bedtime    . OVER THE COUNTER MEDICATION Vegetarian Omega Green: Take 3 capsules by mouth daily    . OVER THE COUNTER MEDICATION Mental Clarity: Take 1 capsule by mouth every morning    . prednisoLONE acetate (PRED FORTE) 1 % ophthalmic suspension Use as directed/Pre-op    . XARELTO 20 MG TABS tablet TAKE 1 TABLET BY MOUTH DAILY WITH DINNER 90 tablet 3   No current facility-administered medications for this visit.     PHYSICAL EXAMINATION: ECOG PERFORMANCE STATUS: 1 - Symptomatic but completely ambulatory  Vitals:   10/18/16 1527  BP: (!) 133/57  Pulse: 80  Resp: 18  Temp: 98.1 F (36.7  C)   Filed Weights   10/18/16 1527  Weight: 141 lb 11.2 oz (64.3 kg)    GENERAL:alert, no distress and comfortable SKIN: skin color, texture, turgor are normal, no rashes or significant lesions EYES: normal, Conjunctiva are pink and non-injected, sclera clear OROPHARYNX:no exudate, no erythema and lips, buccal mucosa, and tongue normal  NECK: supple, thyroid normal size,  non-tender, without nodularity LYMPH:  no palpable lymphadenopathy in the cervical, axillary or inguinal LUNGS: clear to auscultation and percussion with normal breathing effort HEART: regular rate & rhythm and no murmurs and no lower extremity edema ABDOMEN:abdomen soft, non-tender and normal bowel sounds MUSCULOSKELETAL:no cyanosis of digits and no clubbing  NEURO: alert & oriented x 3 with fluent speech, no focal motor/sensory deficits EXTREMITIES: No lower extremity edema BREAST:  No palpable masses or nodules in either right or left breasts. No palpable axillary supraclavicular or infraclavicular adenopathy no breast tenderness or nipple discharge. (exam performed in the presence of a chaperone)  LABORATORY DATA:  I have reviewed the data as listed   Chemistry      Component Value Date/Time   NA 135 08/14/2016 03/10/42   NA 143 10/08/2015 1003   K 3.9 08/14/2016 Mar 10, 2042   K 4.8 10/08/2015 1003   CL 104 08/14/2016 03/10/42   CL 104 09/05/2012 1438   CO2 23 08/14/2016 2043   CO2 31 (H) 10/08/2015 1003   BUN 25 (H) 08/14/2016 03-10-42   BUN 19.8 10/08/2015 1003   CREATININE 0.87 08/14/2016 03-10-2042   CREATININE 0.8 10/08/2015 1003      Component Value Date/Time   CALCIUM 8.7 (L) 08/14/2016 2042-03-10   CALCIUM 9.7 10/08/2015 1003   ALKPHOS 108 10/08/2015 1003   AST 17 10/08/2015 1003   ALT 16 10/08/2015 1003   BILITOT 0.39 10/08/2015 1003       Lab Results  Component Value Date   WBC 10.6 (H) 08/14/2016   HGB 13.8 08/14/2016   HCT 42.3 08/14/2016   MCV 93.4 08/14/2016   PLT 306 08/14/2016   NEUTROABS 4.4 10/08/2015     ASSESSMENT & PLAN:   Cancer of Breast T1bN0M0 triple neg S/P mastectomy/reconstruction 04/2008 Recurrent right breast triple negative cancer: Originally diagnosed with Hodgkin lymphoma in 03/10/86 treated with mantle radiation. He developed breast cancer in 10-Mar-2008 underwent right breast mastectomy for a T1 B. N0 M0 stage IA triple negative breast cancer. In 03/10/12, patient  developed chest wall recurrence that was resected. She is currently being followed by mammograms every year and breast MRIs every other year.  Breast Cancer Surveillance: 1. Breast exam 10/18/2016: Normal, right breast reconstructed, left breast no palpable lumps or nodules 2. Mammogram: Done at [facility] normal. We'll get an MRI next year.  S/P Postsplenectomy.  Hodgkin's lymphoma: Diagnosed 28 years ago and is in remission after mantle radiation   patient's husband died in 2015-03-11 from lung cancer.  Return to clinic in 1 year for follow-up.   No orders of the defined types were placed in this encounter.  The patient has a good understanding of the overall plan. she agrees with it. she will call with any problems that may develop before the next visit here.   Rulon Eisenmenger, MD 10/18/16

## 2016-10-24 DIAGNOSIS — Z23 Encounter for immunization: Secondary | ICD-10-CM | POA: Diagnosis not present

## 2016-11-21 ENCOUNTER — Other Ambulatory Visit: Payer: Self-pay | Admitting: Cardiovascular Disease

## 2016-11-21 ENCOUNTER — Ambulatory Visit (HOSPITAL_COMMUNITY)
Admission: RE | Admit: 2016-11-21 | Discharge: 2016-11-21 | Disposition: A | Discharge status: Home / Self Care | Payer: Medicare Other | Source: Ambulatory Visit | Attending: Cardiology | Admitting: Cardiology

## 2016-11-21 DIAGNOSIS — I6523 Occlusion and stenosis of bilateral carotid arteries: Secondary | ICD-10-CM

## 2016-11-28 ENCOUNTER — Other Ambulatory Visit: Payer: Self-pay | Admitting: Cardiovascular Disease

## 2016-11-30 NOTE — Progress Notes (Signed)
Patient ID: [name], female   DOB: 1946-03-20, 70 y.o.   MRN: [id number]    Cardiology Office Note   Date:  12/01/2016   ID:  [name] 27-Jul-1946, MRN [id number]  PCP:  Patricia Huh, MD  Cardiologist:  Dr. Jenkins Rouge   Electrophysiologist:  n/a  Chief Complaint  Patient presents with  . Bilateral carotid artery disease ([place])  . Chronic diastolic heart failure ([place]     History of Present Illness: [name] is a 70 y.o. female with a hx of NICM with improved EF, diastolic HF, HTN, LBBB, hypothyroidism, Hodgkin's disease, breast CA, PAF, carotid stenosis. Patient underwent radiation therapy for Hodgkin's lymphoma in 1998, mastectomy for breast cancer in 2009 and recurrent breast CA in 2013. EF has been as low as 45% in the past. This is improved to normal over time. Flecainide was started in 12/13 for recurrent A. fib. ETT was negative for proarrhythmia. She was admitted in 5/14 with pulmonary edema in the setting of hypertensive crisis complicated by PEA arrest. She was switched to dofetilide.  Seen by me  08/26/15. She had a recent bout of strep throat and was placed on prednisone. Her blood pressure was running low and her Lasix and ARB were placed on hold. Follow-up echo demonstrated normal LV function with moderate MR. Labs indicated low TSH. Follow-up with primary care was recommended.  She returns for follow-up.  Doing well.  The patient denies chest pain, shortness of breath, syncope, orthopnea, PND or significant pedal edema. No further palpitations. Lasix  Stopped due to Tikosyn And BP being under good control  Has f/u 3D MRI for right breast post cancer Rx I   Studies/Reports Reviewed Today:  Echo 09/03/15 EF 50-55%, apical anteroseptal HK, trivial AI, MAC, moderate MR, PASP 32 mmHg, trivial effusion  Carotid US 11/21/16  R 40-59%; L 60-79%  ETT 12/13 normal - no evidence of ischemia by ST analysis  LHC 5/05 LAD 30%   Past Medical  History:  Diagnosis Date  . Anemia   . Arthritis    fingers   . Atrial fibrillation ([place])    dx in 2008;  CHADS2-VASc=5;  changed to Tikosyn during admx 05/2013; Xarelto started 05/2013  . Blood transfusion    1968  . Breast cancer ([place]) 2009   T1N0M0 DCIS right breast  . Breast cancer ([place]) 2013   a. reocurrence right breast;  b. s/p Taxotere, Cytoxan and XRT  . Carotid stenosis    a. dopplers 8/31:  RICA 5-17%, LICA 61-60% => repeat due in 01/2013;  b.  Carotid US (7/37): RICA 1-06%; LICA 26-94% - f/u 6 mos  . History of radiation therapy 07/10/12-08/13/12   right breast/chest wall  . Hodgkin's disease    stage 2b;  s/p XRT 1988  . HTN (hypertension)   . Hypothyroidism    2/2 XRT for Hodgkins Lymphoma  . LBBB (left bundle branch block)   . Mucositis 04/29/2012  . NICM (nonischemic cardiomyopathy) ([place])    a. EF as low as 45%;  b. LHC 5/05:  LM 10%, pLAD 30%, EF 45-50%,   c. Echo 5/13:  EF 60-65%, Gr 1 diast dysfn, MAC;   d.  Echo 11/13:  EF 60-65%, Gr 1 diast dysfn, Tr AI, mild MR;  d.  Echo 05/10/13: EF 85-46%, grade 1 diastolic dysfunction, PASP 36, small effusion.  . PEA (Pulseless electrical activity) ([place])    a. admx 05/2013 for APE in setting of HTN  emergency c/b PEA arrest and asp pneumonia, VDRF, AFib with RVR  . Raynaud's disease   . Status post radiation therapy 1988   mantle and periaortic     Past Surgical History:  Procedure Laterality Date  . BREAST LUMPECTOMY  2009   right, lymph node biopsy  . BTL  1971  . DILATION AND CURETTAGE OF UTERUS    . MASS EXCISION  03/28/2012   Procedure: EXCISION MASS;  Surgeon: Edward Jolly, MD;  Location: [facility];  Service: General;  Laterality: Right;  Excision right chest wall mass  . MASTECTOMY  2009   right breast  . POLYPECTOMY  1990   D&C (also in 1977 and [place])   . PORT-A-CATH REMOVAL  10/03/2012   Procedure: REMOVAL PORT-A-CATH;  Surgeon: Edward Jolly, MD;  Location: [facility];  Service: General;   Laterality: N/A;  . PORTACATH PLACEMENT  04/16/2012   Procedure: INSERTION PORT-A-CATH;  Surgeon: Edward Jolly, MD;  Location: [facility];  Service: General;  Laterality: Left;  Placement of Port-a-Cath, left subclavian  . R vein ligation & stripping  1976  . Spleenectomy  1988   for Hodgkin's disease  . Thoractomy  1968  . TISSUE EXPANDER  REMOVAL W/ REPLACEMENT OF IMPLANT    . TISSUE EXPANDER PLACEMENT     right breast  . TONSILLECTOMY AND ADENOIDECTOMY  1965     Current Outpatient Prescriptions  Medication Sig Dispense Refill  . calcium carbonate (OS-CAL) 600 MG TABS Take 600 mg by mouth at bedtime.     . carvedilol (COREG) 12.5 MG tablet Take 1 tablet (12.5 mg total) by mouth 2 (two) times daily with a meal. 180 tablet 3  . Cholecalciferol (VITAMIN D) 2000 UNITS CAPS Take 1 capsule by mouth daily.     Marland Kitchen dofetilide (TIKOSYN) 250 MCG capsule TAKE 1 CAPSULE(250 MCG) BY MOUTH EVERY 12 HOURS 180 capsule 2  . levothyroxine (SYNTHROID, LEVOTHROID) 100 MCG tablet Take 100 mcg by mouth daily before breakfast.    . losartan (COZAAR) 25 MG tablet Take 1 tablet (25 mg total) by mouth 2 (two) times daily. 180 tablet 3  . LUTEIN-ZEAXANTHIN PO Take 1 capsule by mouth every morning.    . magnesium oxide (MAG-OX 400) 400 MG tablet Take 400 mg by mouth 2 (two) times daily.     . Multiple Vitamin (MULTIVITAMIN) tablet Take 1 tablet by mouth every morning. "Natural Vitamin"    . naproxen sodium (ALEVE) 220 MG tablet Take 220 mg by mouth 2 (two) times daily as needed (for mild pain).    Marland Kitchen OVER THE COUNTER MEDICATION Osteodenz Capsules: Take 1 capsule by mouth at bedtime    . OVER THE COUNTER MEDICATION Vegetarian Omega Green: Take 3 capsules by mouth daily    . OVER THE COUNTER MEDICATION Mental Clarity: Take 1 capsule by mouth every morning    . Red Yeast Rice Extract (RED YEAST RICE PO) Take 1,200 mg by mouth daily.    Alveda Reasons 20 MG TABS tablet TAKE 1 TABLET BY MOUTH DAILY WITH DINNER 90 tablet 3    No current facility-administered medications for this visit.     Allergies:   Ace inhibitors; Benazepril; Contrast media [iodinated diagnostic agents]; Epinephrine; Iohexol; Potassium-containing compounds; Prednisolone; and Taxotere [docetaxel]    Social History:  The patient  reports that she has never smoked. She has never used smokeless tobacco. She reports that she does not drink alcohol or use drugs.   Family History:  The patient's family history includes Hypertension in her mother.    ROS:   Please see the history of present illness.   Review of Systems  All other systems reviewed and are negative.     PHYSICAL EXAM: VS:  BP (!) 142/74   Pulse 71   Ht '5\' 2"'$  (1.575 m)   Wt 142 lb 6.4 oz (64.6 kg)   LMP 03/08/1995   SpO2 97%   BMI 26.05 kg/m     Wt Readings from Last 3 Encounters:  12/01/16 142 lb 6.4 oz (64.6 kg)  10/18/16 141 lb 11.2 oz (64.3 kg)  08/30/16 138 lb (62.6 kg)     GEN: Well nourished, well developed, in no acute distress HEENT: normal Neck: no JVD,   no masses Cardiac:  Normal S1/S2, RRR; no murmur ,  no rubs or gallops, no edema   Respiratory:  clear to auscultation bilaterally, no wheezing, rhonchi or rales. GI: soft, nontender, nondistended, + BS MS: no deformity or atrophy Skin: warm and dry  Neuro:  CNs II-XII intact, Strength and sensation are intact Psych: Normal affect   EKG:  EKG is not ordered today.  It demonstrates:   n/a   Recent Labs: 08/14/2016: BUN 25; Creatinine, Ser 0.87; Hemoglobin 13.8; Magnesium 2.2; Platelets 306; Potassium 3.9; Sodium 135; TSH 2.038    Lipid Panel    Component Value Date/Time   CHOL 163 05/18/2013 0440   TRIG 175 (H) 05/18/2013 0440   HDL 26 (L) 05/18/2013 0440   CHOLHDL 6.3 05/18/2013 0440   VLDL 35 05/18/2013 0440   LDLCALC 102 (H) 05/18/2013 0440      ASSESSMENT AND PLAN:  1. Chronic Diastolic CHF:  Volume stable.  She is back on beta-blocker, angiotensin receptor blocker   2. PAF:   No recurrent palpitations.  Continue Xarelto, Tikosyn.    3. HTN:  Controlled. Lasix stopped   4. Hypothyroidism: FU with PCP.    5. Carotid stenosis:  FU planned  Reviewed dupex from 11/21/16 and stable  47-34% LICA stenosis   6. Breast Cancer in remission f/u oncology  3D MRI in July     Disposition:    FU with me 6 mos. With carotid    Jenkins Rouge

## 2016-12-01 ENCOUNTER — Encounter: Payer: Self-pay | Admitting: Cardiovascular Disease

## 2016-12-01 ENCOUNTER — Ambulatory Visit (INDEPENDENT_AMBULATORY_CARE_PROVIDER_SITE_OTHER): Payer: Medicare Other | Admitting: Cardiovascular Disease

## 2016-12-01 VITALS — BP 142/74 | HR 71 | Ht 62.0 in | Wt 142.4 lb

## 2016-12-01 DIAGNOSIS — I779 Disorder of arteries and arterioles, unspecified: Secondary | ICD-10-CM

## 2016-12-01 DIAGNOSIS — I739 Peripheral vascular disease, unspecified: Secondary | ICD-10-CM

## 2016-12-01 DIAGNOSIS — I5032 Chronic diastolic (congestive) heart failure: Secondary | ICD-10-CM

## 2016-12-01 DIAGNOSIS — I6523 Occlusion and stenosis of bilateral carotid arteries: Secondary | ICD-10-CM | POA: Diagnosis not present

## 2016-12-01 NOTE — Patient Instructions (Addendum)
Medication Instructions:  Your physician recommends that you continue on your current medications as directed. Please refer to the Current Medication list given to you today.  Labwork: NONE  Testing/Procedures: Your physician has requested that you have a carotid duplex in 6 months. This test is an ultrasound of the carotid arteries in your neck. It looks at blood flow through these arteries that supply the brain with blood. Allow one hour for this exam. There are no restrictions or special instructions.  Follow-Up: Your physician wants you to follow-up in: 6 months with Dr. Nishan. You will receive a reminder letter in the mail two months in advance. If you don't receive a letter, please call our office to schedule the follow-up appointment.   If you need a refill on your cardiac medications before your next appointment, please call your pharmacy.    

## 2016-12-07 ENCOUNTER — Encounter: Payer: Self-pay | Admitting: Cardiovascular Disease

## 2016-12-07 DIAGNOSIS — I429 Cardiomyopathy, unspecified: Secondary | ICD-10-CM | POA: Diagnosis not present

## 2016-12-07 DIAGNOSIS — E7800 Pure hypercholesterolemia, unspecified: Secondary | ICD-10-CM | POA: Diagnosis not present

## 2016-12-07 DIAGNOSIS — I4891 Unspecified atrial fibrillation: Secondary | ICD-10-CM | POA: Diagnosis not present

## 2016-12-07 DIAGNOSIS — E039 Hypothyroidism, unspecified: Secondary | ICD-10-CM | POA: Diagnosis not present

## 2016-12-07 DIAGNOSIS — M8580 Other specified disorders of bone density and structure, unspecified site: Secondary | ICD-10-CM | POA: Diagnosis not present

## 2016-12-14 ENCOUNTER — Telehealth: Payer: Self-pay

## 2016-12-14 MED ORDER — ATORVASTATIN CALCIUM 10 MG PO TABS: 10.0000 mg | ORAL_TABLET | Freq: Every day | ORAL | 11 refills | Status: DC

## 2016-12-14 NOTE — Telephone Encounter (Signed)
No stop red yeast while on lipitor

## 2016-12-14 NOTE — Telephone Encounter (Signed)
Patient given lab results. Patient will try low dose lipitor 10 mg daily. Patient wants to know if she should continue with red yeast rice, while taking lipitor. Will forward to Dr. Johnsie Cancel for advisement.

## 2016-12-14 NOTE — Telephone Encounter (Signed)
-----   Message from Josue Hector, MD sent at 12/09/2016  1:46 PM EST ----- LDL is 125 given carotid stenosis should be less than 70 She is taking red yeast rice But should be on low dose statin if she will take it Discuss with her starting lipitor 10 mg

## 2016-12-15 ENCOUNTER — Other Ambulatory Visit: Payer: Self-pay | Admitting: Nurse Practitioner

## 2016-12-15 NOTE — Telephone Encounter (Signed)
Called patient and informed her that she is not to take red yeast rice while taking Lipitor. Patient verbalized understanding.

## 2016-12-19 DIAGNOSIS — S82002A Unspecified fracture of left patella, initial encounter for closed fracture: Secondary | ICD-10-CM

## 2016-12-19 HISTORY — DX: Unspecified fracture of left patella, initial encounter for closed fracture: S82.002A

## 2016-12-23 ENCOUNTER — Telehealth: Payer: Self-pay | Admitting: Cardiovascular Disease

## 2016-12-23 DIAGNOSIS — S82092A Other fracture of left patella, initial encounter for closed fracture: Secondary | ICD-10-CM | POA: Diagnosis not present

## 2016-12-23 DIAGNOSIS — M25562 Pain in left knee: Secondary | ICD-10-CM | POA: Diagnosis not present

## 2016-12-23 DIAGNOSIS — M79662 Pain in left lower leg: Secondary | ICD-10-CM | POA: Diagnosis not present

## 2016-12-23 NOTE — Telephone Encounter (Signed)
New Message     Pt c/o medication issue:  1. Name of Medication: Xarelto  2. How are you currently taking this medication (dosage and times per day)?  1 per day  3. Are you having a reaction (difficulty breathing--STAT)? no  4. What is your medication issue? Broke hip yesterday, orthopedic doctor wants her to stop taking this Dr [facility] [phone]

## 2016-12-23 NOTE — Telephone Encounter (Signed)
Will route to Dr. Nishan 

## 2017-01-06 DIAGNOSIS — S82092D Other fracture of left patella, subsequent encounter for closed fracture with routine healing: Secondary | ICD-10-CM | POA: Diagnosis not present

## 2017-01-20 DIAGNOSIS — S82032D Displaced transverse fracture of left patella, subsequent encounter for closed fracture with routine healing: Secondary | ICD-10-CM | POA: Diagnosis not present

## 2017-03-03 DIAGNOSIS — S82032D Displaced transverse fracture of left patella, subsequent encounter for closed fracture with routine healing: Secondary | ICD-10-CM | POA: Diagnosis not present

## 2017-03-13 DIAGNOSIS — D2271 Melanocytic nevi of right lower limb, including hip: Secondary | ICD-10-CM | POA: Diagnosis not present

## 2017-03-13 DIAGNOSIS — D2261 Melanocytic nevi of right upper limb, including shoulder: Secondary | ICD-10-CM | POA: Diagnosis not present

## 2017-03-13 DIAGNOSIS — D1801 Hemangioma of skin and subcutaneous tissue: Secondary | ICD-10-CM | POA: Diagnosis not present

## 2017-03-13 DIAGNOSIS — L918 Other hypertrophic disorders of the skin: Secondary | ICD-10-CM | POA: Diagnosis not present

## 2017-03-13 DIAGNOSIS — L821 Other seborrheic keratosis: Secondary | ICD-10-CM | POA: Diagnosis not present

## 2017-03-13 DIAGNOSIS — L720 Epidermal cyst: Secondary | ICD-10-CM | POA: Diagnosis not present

## 2017-03-13 DIAGNOSIS — D2262 Melanocytic nevi of left upper limb, including shoulder: Secondary | ICD-10-CM | POA: Diagnosis not present

## 2017-03-13 DIAGNOSIS — D2272 Melanocytic nevi of left lower limb, including hip: Secondary | ICD-10-CM | POA: Diagnosis not present

## 2017-04-20 ENCOUNTER — Telehealth: Payer: Self-pay | Admitting: Cardiovascular Disease

## 2017-04-20 NOTE — Telephone Encounter (Signed)
SPOKE WITH  PT  HAS  NOTED  PALPITATIONS SINCE  Sunday   WITH  OCC  DIZZINESS  HEART RATE  INCREASES  WITH  LITTLE  ACTIVITY DISCUSSED WITH DR Johnsie Cancel   PER  DR NISHAN   WILL SEE PT  TOM  AT  8:00 AM   AND  NEEDS EVENT  MONITOR   NO MONITORS  AVAILABLE TODAY  PT  AWARE   AND  WILL HAVE  APPT   INFORMED PT  IF S/S  WORSEN THEN  NEED TO GO  NEAREST  ED  FOR EVAL AND  TX PT  VERBALIZED UNDERSTANDING .Adonis Housekeeper

## 2017-04-20 NOTE — Telephone Encounter (Signed)
New message     Patient c/o Palpitations:  High priority if patient c/o lightheadedness and shortness of breath.  1. How long have you been having palpitations? Since Sunday   2. Are you currently experiencing lightheadedness and shortness of breath?light headedness that is getting worse   3. Have you checked your BP and heart rate? (document readings)  148/106 hr 96   10 minutes later 121/ ?  Hr 64   4. Are you experiencing any other symptoms? Has to stay in resting position  Or it starts up, gets worse when she bends over  Or lifts her arms

## 2017-04-21 ENCOUNTER — Encounter: Payer: Self-pay | Admitting: Cardiovascular Disease

## 2017-04-21 ENCOUNTER — Ambulatory Visit (INDEPENDENT_AMBULATORY_CARE_PROVIDER_SITE_OTHER): Payer: Medicare Other | Admitting: Cardiovascular Disease

## 2017-04-21 VITALS — BP 106/60 | HR 74 | Ht 62.0 in | Wt 137.4 lb

## 2017-04-21 DIAGNOSIS — R079 Chest pain, unspecified: Secondary | ICD-10-CM

## 2017-04-21 DIAGNOSIS — R55 Syncope and collapse: Secondary | ICD-10-CM

## 2017-04-21 DIAGNOSIS — I480 Paroxysmal atrial fibrillation: Secondary | ICD-10-CM | POA: Diagnosis not present

## 2017-04-21 DIAGNOSIS — I6523 Occlusion and stenosis of bilateral carotid arteries: Secondary | ICD-10-CM

## 2017-04-21 LAB — CBC WITH DIFFERENTIAL/PLATELET
BASOS ABS: 0.1 10*3/uL (ref 0.0–0.2)
Basos: 1 %
EOS (ABSOLUTE): 0.3 10*3/uL (ref 0.0–0.4)
Eos: 4 %
Hematocrit: 40.1 % (ref 34.0–46.6)
Hemoglobin: 13.2 g/dL (ref 11.1–15.9)
IMMATURE GRANS (ABS): 0 10*3/uL (ref 0.0–0.1)
Immature Granulocytes: 0 %
LYMPHS: 19 %
Lymphocytes Absolute: 1.5 10*3/uL (ref 0.7–3.1)
MCH: 30.8 pg (ref 26.6–33.0)
MCHC: 32.9 g/dL (ref 31.5–35.7)
MCV: 94 fL (ref 79–97)
MONOS ABS: 0.8 10*3/uL (ref 0.1–0.9)
Monocytes: 10 %
NEUTROS ABS: 5.4 10*3/uL (ref 1.4–7.0)
Neutrophils: 66 %
PLATELETS: 342 10*3/uL (ref 150–379)
RBC: 4.28 x10E6/uL (ref 3.77–5.28)
RDW: 13.8 % (ref 12.3–15.4)
WBC: 8 10*3/uL (ref 3.4–10.8)

## 2017-04-21 LAB — BASIC METABOLIC PANEL
BUN / CREAT RATIO: 23 (ref 12–28)
BUN: 19 mg/dL (ref 8–27)
CHLORIDE: 101 mmol/L (ref 96–106)
CO2: 24 mmol/L (ref 18–29)
Calcium: 9.4 mg/dL (ref 8.7–10.3)
Creatinine, Ser: 0.81 mg/dL (ref 0.57–1.00)
GFR calc non Af Amer: 73 mL/min/{1.73_m2} (ref >59)
GFR, EST AFRICAN AMERICAN: 85 mL/min/{1.73_m2} (ref >59)
GLUCOSE: 77 mg/dL (ref 65–99)
POTASSIUM: 5.1 mmol/L (ref 3.5–5.2)
SODIUM: 141 mmol/L (ref 134–144)

## 2017-04-21 LAB — TSH: TSH: 1.99 u[IU]/mL (ref 0.450–4.500)

## 2017-04-21 LAB — MAGNESIUM: MAGNESIUM: 2.2 mg/dL (ref 1.6–2.3)

## 2017-04-21 LAB — T4, FREE: Free T4: 1.88 ng/dL — ABNORMAL HIGH (ref 0.82–1.77)

## 2017-04-21 NOTE — Patient Instructions (Addendum)
Medication Instructions:  Your physician recommends that you continue on your current medications as directed. Please refer to the Current Medication list given to you today.  Labwork: Your physician recommends that you have lab work today - BMET, CBC, TSH, T4 and Mg  Testing/Procedures: Your physician has recommended that you wear an event monitor. Event monitors are medical devices that record the heart's electrical activity. Doctors most often [place] these monitors to diagnose arrhythmias. Arrhythmias are problems with the speed or rhythm of the heartbeat. The monitor is a small, portable device. You can wear one while you do your normal daily activities. This is usually used to diagnose what is causing palpitations/syncope (passing out).  Your physician has requested that you have an echocardiogram. Echocardiography is a painless test that uses sound waves to create images of your heart. It provides your doctor with information about the size and shape of your heart and how well your heart's chambers and valves are working. This procedure takes approximately one hour. There are no restrictions for this procedure.  Your physician has requested that you have a lexiscan myoview. For further information please visit [email or web address]. Please follow instruction sheet, as given.  Follow-Up: Your physician recommends that you schedule a follow-up appointment in: 2 weeks with Atrial Fib clinic.  Your physician wants you to follow-up in: 6 to 8 weeks with Dr. Johnsie Cancel or next available.  If you need a refill on your cardiac medications before your next appointment, please call your pharmacy.

## 2017-04-21 NOTE — Progress Notes (Signed)
Patient ID: ROSHANNA [name], female   DOB: May 25, 1946, 71 y.o.   MRN: [id number]    Cardiology Office Note   Date:  04/21/2017   ID:  [name], [name] 1946-10-21, MRN [id number]  PCP:  Patricia Huh, MD  Cardiologist:  Dr. Jenkins Rouge   Electrophysiologist:  n/a  Chief Complaint  Patient presents with  . Carotid stenosis, bilateral     History of Present Illness: [name] is a 71 y.o. female with a hx of NICM with improved EF, diastolic HF, HTN, LBBB, hypothyroidism, Hodgkin's disease, breast CA, PAF, carotid stenosis. Patient underwent radiation therapy for Hodgkin's lymphoma in 1998, mastectomy for breast cancer in 2009 and recurrent breast CA in 2013. EF has been as low as 45% in the past. This is improved to normal over time. Flecainide was started in 12/13 for recurrent A. fib. ETT was negative for proarrhythmia. She was admitted in 5/14 with pulmonary edema in the setting of hypertensive crisis complicated by PEA arrest. She was switched to dofetilide.  Seen by me  08/26/15. She had a recent bout of strep throat and was placed on prednisone. Her blood pressure was running low and her Lasix and ARB were placed on hold. Follow-up echo demonstrated normal LV function with moderate MR. Labs indicated low TSH. Follow-up with primary care was recommended.  She returns for follow-up.  Doing well.  The patient denies chest pain, shortness of breath, syncope, orthopnea, PND or significant pedal edema. No further palpitations. Lasix  Stopped due to Tikosyn And BP being under good control  Has f/u 3D MRI for right breast post cancer Rx I   Studies/Reports Reviewed Today:  Echo 09/03/15 EF 50-55%, apical anteroseptal HK, trivial AI, MAC, moderate MR, PASP 32 mmHg, trivial effusion  Carotid US 11/21/16  R 40-59%; L 60-79%  ETT 12/13 normal - no evidence of ischemia by ST analysis  LHC 5/05 LAD 30%  Called office 04/20/17 with palpitations for last 5 days .  Episodes latter in  day can last 2 minutes to 30 minutes Mild dyspnea with it No chest pain. Palpitations tend to be rapid. Compliant with meds.    Past Medical History:  Diagnosis Date  . Anemia   . Arthritis    fingers   . Atrial fibrillation ([place])    dx in 2008;  CHADS2-VASc=5;  changed to Tikosyn during admx 05/2013; Xarelto started 05/2013  . Blood transfusion    1968  . Breast cancer ([place]) 2009   T1N0M0 DCIS right breast  . Breast cancer ([place]) 2013   a. reocurrence right breast;  b. s/p Taxotere, Cytoxan and XRT  . Carotid stenosis    a. dopplers 7/61:  RICA 9-50%, LICA 93-26% => repeat due in 01/2013;  b.  Carotid US (7/12): RICA 4-58%; LICA 09-98% - f/u 6 mos  . History of radiation therapy 07/10/12-08/13/12   right breast/chest wall  . Hodgkin's disease    stage 2b;  s/p XRT 1988  . HTN (hypertension)   . Hypothyroidism    2/2 XRT for Hodgkins Lymphoma  . LBBB (left bundle branch block)   . Mucositis 04/29/2012  . NICM (nonischemic cardiomyopathy) ([place])    a. EF as low as 45%;  b. LHC 5/05:  LM 10%, pLAD 30%, EF 45-50%,   c. Echo 5/13:  EF 60-65%, Gr 1 diast dysfn, MAC;   d.  Echo 11/13:  EF 60-65%, Gr 1 diast dysfn, Tr AI, mild MR;  d.  Echo  05/10/13: EF 15-40%, grade 1 diastolic dysfunction, PASP 36, small effusion.  . PEA (Pulseless electrical activity) ([place])    a. admx 05/2013 for APE in setting of HTN emergency c/b PEA arrest and asp pneumonia, VDRF, AFib with RVR  . Raynaud's disease   . Status post radiation therapy 1988   mantle and periaortic     Past Surgical History:  Procedure Laterality Date  . BREAST LUMPECTOMY  2009   right, lymph node biopsy  . BTL  1971  . DILATION AND CURETTAGE OF UTERUS    . MASS EXCISION  03/28/2012   Procedure: EXCISION MASS;  Surgeon: Edward Jolly, MD;  Location: [facility];  Service: General;  Laterality: Right;  Excision right chest wall mass  . MASTECTOMY  2009   right breast  . POLYPECTOMY  1990   D&C (also in 1977 and [place])    . PORT-A-CATH REMOVAL  10/03/2012   Procedure: REMOVAL PORT-A-CATH;  Surgeon: Edward Jolly, MD;  [facility];  Service: General;  Laterality: N/A;  . PORTACATH PLACEMENT  04/16/2012   Procedure: INSERTION PORT-A-CATH;  Surgeon: Edward Jolly, MD;  [facility];  Service: General;  Laterality: Left;  Placement of Port-a-Cath, left subclavian  . R vein ligation & stripping  1976  . Spleenectomy  1988   for Hodgkin's disease  . Thoractomy  1968  . TISSUE EXPANDER  REMOVAL W/ REPLACEMENT OF IMPLANT    . TISSUE EXPANDER PLACEMENT     right breast  . TONSILLECTOMY AND ADENOIDECTOMY  1965     Current Outpatient Prescriptions  Medication Sig Dispense Refill  . atorvastatin (LIPITOR) 10 MG tablet Take 1 tablet (10 mg total) by mouth daily at 6 PM. 30 tablet 11  . calcium carbonate (OS-CAL) 600 MG TABS Take 600 mg by mouth at bedtime.     . carvedilol (COREG) 12.5 MG tablet Take 1 tablet (12.5 mg total) by mouth 2 (two) times daily with a meal. 180 tablet 3  . Cholecalciferol (VITAMIN D) 2000 UNITS CAPS Take 1 capsule by mouth daily.     Marland Kitchen dofetilide (TIKOSYN) 250 MCG capsule TAKE 1 CAPSULE(250 MCG) BY MOUTH EVERY 12 HOURS 180 capsule 2  . levothyroxine (SYNTHROID, LEVOTHROID) 100 MCG tablet Take 100 mcg by mouth daily before breakfast.    . losartan (COZAAR) 25 MG tablet Take 1 tablet (25 mg total) by mouth 2 (two) times daily. 180 tablet 3  . LUTEIN-ZEAXANTHIN PO Take 1 capsule by mouth every morning.    . magnesium oxide (MAG-OX 400) 400 MG tablet Take 400 mg by mouth 2 (two) times daily.     . Multiple Vitamin (MULTIVITAMIN) tablet Take 1 tablet by mouth every morning. "Natural Vitamin"    . naproxen sodium (ALEVE) 220 MG tablet Take 220 mg by mouth 2 (two) times daily as needed (for mild pain).    Marland Kitchen OVER THE COUNTER MEDICATION Osteodenz Capsules: Take 1 capsule by mouth at bedtime    . OVER THE COUNTER MEDICATION Vegetarian Omega Green: Take 3 capsules by mouth daily     . OVER THE COUNTER MEDICATION Mental Clarity: Take 1 capsule by mouth every morning    . XARELTO 20 MG TABS tablet TAKE 1 TABLET BY MOUTH DAILY WITH DINNER 90 tablet 3   No current facility-administered medications for this visit.     Allergies:   Ace inhibitors; Benazepril; Contrast media [iodinated diagnostic agents]; Epinephrine; Iohexol; Potassium-containing compounds; Prednisolone; and Taxotere [docetaxel]  Social History:  The patient  reports that she has never smoked. She has never used smokeless tobacco. She reports that she does not drink alcohol or use drugs.   Family History:  The patient's family history includes Hypertension in her mother.    ROS:   Please see the history of present illness.   Review of Systems  All other systems reviewed and are negative.     PHYSICAL EXAM: VS:  BP 106/60   Pulse 74   Ht 5' 2"  (1.575 m)   Wt 137 lb 6.4 oz (62.3 kg)   LMP 03/08/1995   SpO2 98%   BMI 25.13 kg/m     Wt Readings from Last 3 Encounters:  04/21/17 137 lb 6.4 oz (62.3 kg)  12/01/16 142 lb 6.4 oz (64.6 kg)  10/18/16 141 lb 11.2 oz (64.3 kg)     BP 106/60   Pulse 74   Ht 5' 2"  (1.575 m)   Wt 137 lb 6.4 oz (62.3 kg)   LMP 03/08/1995   SpO2 98%   BMI 25.13 kg/m  Affect appropriate Healthy:  appears stated age 67: normal Neck supple with no adenopathy JVP normal left bruits no thyromegaly Lungs clear with no wheezing and good diaphragmatic motion Heart:  S1/S2 no murmur, no rub, gallop or click PMI normal Abdomen: benighn, BS positve, no tenderness, no AAA no bruit.  No HSM or HJR Distal pulses intact with no bruits No edema Neuro non-focal Skin warm and dry No muscular weakness    EKG:   SR rate 64 IVcD QT 462 08/24/16  04/21/17  SR rate 71 Poor R wave progression inferior lateral T wave Changes QT 450    Recent Labs: 08/14/2016: BUN 25; Creatinine, Ser 0.87; Hemoglobin 13.8; Magnesium 2.2; Platelets 306; Potassium 3.9; Sodium 135; TSH 2.038      Lipid Panel    Component Value Date/Time   CHOL 163 05/18/2013 0440   TRIG 175 (H) 05/18/2013 0440   HDL 26 (L) 05/18/2013 0440   CHOLHDL 6.3 05/18/2013 0440   VLDL 35 05/18/2013 0440   LDLCALC 102 (H) 05/18/2013 0440      ASSESSMENT AND PLAN:  1. Chronic Diastolic CHF:  Volume stable.  She is back on beta-blocker, angiotensin receptor blocker   2. PAF:  Likely break through PAF Event monitor ECG with some changes will update echo and exercise myovue To r/o structural change. Continue coreg xarelto. She is on lower dose tikosyn due to prolonged QT on higher dose during initiation F/U Afib clinic 2 weeks and referral to EP for possible ablation if monitor shows PAF Not sure there is another med to try ? Multaq.     3. HTN:  Controlled. Lasix stopped   4. Hypothyroidism: FU with PCP.  TSH/T4 today   5. Carotid stenosis:  FU planned  Reviewed dupex from 11/21/16 and stable  82-99% LICA stenosis  F/U duplex in June  6. Breast Cancer in remission f/u oncology  3D MRI in July     Disposition:    F/U afib clinic 2 weeks and me 6-8 refer to EP if monitor shows PAF for ablation consideration    Jenkins Rouge

## 2017-04-27 ENCOUNTER — Ambulatory Visit (INDEPENDENT_AMBULATORY_CARE_PROVIDER_SITE_OTHER): Payer: Medicare Other

## 2017-04-27 DIAGNOSIS — I480 Paroxysmal atrial fibrillation: Secondary | ICD-10-CM

## 2017-04-27 DIAGNOSIS — R079 Chest pain, unspecified: Secondary | ICD-10-CM | POA: Diagnosis not present

## 2017-04-27 DIAGNOSIS — R55 Syncope and collapse: Secondary | ICD-10-CM | POA: Diagnosis not present

## 2017-05-01 ENCOUNTER — Telehealth (HOSPITAL_COMMUNITY): Payer: Self-pay | Admitting: *Deleted

## 2017-05-01 ENCOUNTER — Telehealth: Payer: Self-pay | Admitting: Cardiovascular Disease

## 2017-05-01 NOTE — Telephone Encounter (Signed)
[facility] CALLED  WITH  CRITICAL   RECORDING .RECORDING  PRINTED  AND  DR  Johnsie Cancel  REVIEWED .   PT   NEEDS  TO SEE EP  TO  DISCUSS   AFLUTTER ABLATION  WILL FORWARD  NOTE TO  SCHEDULER  TO MAKE  AND  APPT  PER PT  HAS  SEEN DR Caryl Comes IN PAST .PT  AWARE .Adonis Housekeeper

## 2017-05-01 NOTE — Telephone Encounter (Signed)
ew Message:   [name] from [place] has a critical EKG-

## 2017-05-01 NOTE — Telephone Encounter (Signed)
Patient given detailed instructions per Myocardial Perfusion Study Information Sheet for the test on 05/05/17. Patient notified to arrive 15 minutes early and that it is imperative to arrive on time for appointment to keep from having the test rescheduled.  If you need to cancel or reschedule your appointment, please call the office within 24 hours of your appointment. Failure to do so may result in a cancellation of your appointment, and a $50 no show fee. Patient verbalized understanding. Kirstie Peri

## 2017-05-02 ENCOUNTER — Telehealth: Payer: Self-pay | Admitting: *Deleted

## 2017-05-02 NOTE — Telephone Encounter (Signed)
I received event monitor tracing dated 05/01/17 @ 08:27 PM (CT):  SVT, sinus rhythm with atrial run/PVCs/PACs-rate 185.7  Pt already has an appointment with Ceasar Lund in the [facility] 05/22/17.  Dr Curt Bears reviewed tracing, recommended sooner follow up with Ulis Rias, NP.  Pt's appt with Roderic Palau, NP has been moved to 05/08/17 9:30 AM, code 6001.  LMTCB for pt.

## 2017-05-02 NOTE — Telephone Encounter (Signed)
Pt aware appt with Roderic Palau, NP has been moved to 05/08/17 9:30 AM.

## 2017-05-04 ENCOUNTER — Telehealth: Payer: Self-pay

## 2017-05-04 NOTE — Telephone Encounter (Signed)
Received [facility] report dated 05/01/17 at 02:29 PM (CT). Report analysis: Atrial Fbrillation RVR offset, Sinus Rhythum w/ PVCs (2 in 1 min)/atrial run/couplet/PACs/PACs. Pt had previous event monitor reading on 05/01/17 at 08:27 PM (CT). Issue addressed by Dr. Curt Bears on 05/02/17. Pt has appointment with Roderic Palau, NP on 05/08/17.

## 2017-05-05 ENCOUNTER — Ambulatory Visit (HOSPITAL_BASED_OUTPATIENT_CLINIC_OR_DEPARTMENT_OTHER): Payer: Medicare Other

## 2017-05-05 ENCOUNTER — Other Ambulatory Visit: Payer: Self-pay

## 2017-05-05 ENCOUNTER — Ambulatory Visit (HOSPITAL_COMMUNITY): Payer: Medicare Other | Attending: Cardiology

## 2017-05-05 DIAGNOSIS — I340 Nonrheumatic mitral (valve) insufficiency: Secondary | ICD-10-CM | POA: Diagnosis not present

## 2017-05-05 DIAGNOSIS — I447 Left bundle-branch block, unspecified: Secondary | ICD-10-CM | POA: Insufficient documentation

## 2017-05-05 DIAGNOSIS — I517 Cardiomegaly: Secondary | ICD-10-CM | POA: Diagnosis not present

## 2017-05-05 DIAGNOSIS — R9439 Abnormal result of other cardiovascular function study: Secondary | ICD-10-CM | POA: Diagnosis not present

## 2017-05-05 DIAGNOSIS — R002 Palpitations: Secondary | ICD-10-CM | POA: Diagnosis not present

## 2017-05-05 DIAGNOSIS — R55 Syncope and collapse: Secondary | ICD-10-CM | POA: Diagnosis not present

## 2017-05-05 DIAGNOSIS — I358 Other nonrheumatic aortic valve disorders: Secondary | ICD-10-CM | POA: Insufficient documentation

## 2017-05-05 DIAGNOSIS — I480 Paroxysmal atrial fibrillation: Secondary | ICD-10-CM

## 2017-05-05 DIAGNOSIS — R079 Chest pain, unspecified: Secondary | ICD-10-CM

## 2017-05-05 DIAGNOSIS — I361 Nonrheumatic tricuspid (valve) insufficiency: Secondary | ICD-10-CM | POA: Insufficient documentation

## 2017-05-05 DIAGNOSIS — R0602 Shortness of breath: Secondary | ICD-10-CM | POA: Diagnosis not present

## 2017-05-05 DIAGNOSIS — I4891 Unspecified atrial fibrillation: Secondary | ICD-10-CM | POA: Diagnosis present

## 2017-05-05 DIAGNOSIS — I10 Essential (primary) hypertension: Secondary | ICD-10-CM | POA: Insufficient documentation

## 2017-05-05 LAB — MYOCARDIAL PERFUSION IMAGING
CHL CUP NUCLEAR SDS: 3
CSEPPHR: 88 {beats}/min
LHR: 0.22
LV dias vol: 74 mL (ref 46–106)
LV sys vol: 31 mL
Rest HR: 71 {beats}/min
SRS: 5
SSS: 8
TID: 0.93

## 2017-05-05 MED ORDER — REGADENOSON 0.4 MG/5ML IV SOLN
0.4000 mg | Freq: Once | INTRAVENOUS | Status: AC
  Administered 2017-05-05: 0.4 mg via INTRAVENOUS

## 2017-05-05 MED ORDER — TECHNETIUM TC 99M TETROFOSMIN IV KIT
32.7000 | PACK | Freq: Once | INTRAVENOUS | Status: AC | PRN
  Administered 2017-05-05: 32.7 via INTRAVENOUS
  Filled 2017-05-05: qty 33

## 2017-05-05 MED ORDER — TECHNETIUM TC 99M TETROFOSMIN IV KIT
10.4000 | PACK | Freq: Once | INTRAVENOUS | Status: AC | PRN
  Administered 2017-05-05: 10.4 via INTRAVENOUS
  Filled 2017-05-05: qty 11

## 2017-05-08 ENCOUNTER — Ambulatory Visit (HOSPITAL_COMMUNITY)
Admission: RE | Admit: 2017-05-08 | Discharge: 2017-05-08 | Disposition: A | Discharge status: Home / Self Care | Payer: Medicare Other | Source: Ambulatory Visit | Attending: Nurse Practitioner | Admitting: Nurse Practitioner

## 2017-05-08 ENCOUNTER — Encounter (HOSPITAL_COMMUNITY): Payer: Self-pay | Admitting: Nurse Practitioner

## 2017-05-08 ENCOUNTER — Telehealth: Payer: Self-pay

## 2017-05-08 VITALS — BP 146/72 | HR 67 | Ht 62.0 in | Wt 137.6 lb

## 2017-05-08 DIAGNOSIS — Z853 Personal history of malignant neoplasm of breast: Secondary | ICD-10-CM | POA: Diagnosis not present

## 2017-05-08 DIAGNOSIS — I272 Pulmonary hypertension, unspecified: Secondary | ICD-10-CM | POA: Diagnosis not present

## 2017-05-08 DIAGNOSIS — I480 Paroxysmal atrial fibrillation: Secondary | ICD-10-CM

## 2017-05-08 DIAGNOSIS — E039 Hypothyroidism, unspecified: Secondary | ICD-10-CM | POA: Insufficient documentation

## 2017-05-08 DIAGNOSIS — Z923 Personal history of irradiation: Secondary | ICD-10-CM | POA: Diagnosis not present

## 2017-05-08 DIAGNOSIS — Z8571 Personal history of Hodgkin lymphoma: Secondary | ICD-10-CM | POA: Insufficient documentation

## 2017-05-08 DIAGNOSIS — I10 Essential (primary) hypertension: Secondary | ICD-10-CM | POA: Diagnosis not present

## 2017-05-08 DIAGNOSIS — I7300 Raynaud's syndrome without gangrene: Secondary | ICD-10-CM | POA: Insufficient documentation

## 2017-05-08 DIAGNOSIS — Z7901 Long term (current) use of anticoagulants: Secondary | ICD-10-CM | POA: Diagnosis not present

## 2017-05-08 DIAGNOSIS — I429 Cardiomyopathy, unspecified: Secondary | ICD-10-CM | POA: Insufficient documentation

## 2017-05-08 DIAGNOSIS — I447 Left bundle-branch block, unspecified: Secondary | ICD-10-CM | POA: Diagnosis not present

## 2017-05-08 NOTE — Progress Notes (Signed)
Primary Care Physician: [name], MD Referring Physician: Dr. Lanier Ensign TASHEEMA PERRONE is a 71 y.o. female with a h/o afib that has been on tikosyn x 4 years in the afib clinic for evaluation for episodes of afib.  Also hx of NICM with improved EF, diastolic HF, HTN, LBBB, hypothyroidism, Hodgkin's disease, breast CA, PAF, carotid stenosis. Patient underwent radiation therapy for Hodgkin's lymphoma in 1998, mastectomy for breast cancer in 2009 and recurrent breast CA in 2013. EF has been as low as 45% in the past. This is improved to normal over time. Flecainide was started in 12/13 for recurrent A. fib. ETT was negative for proarrhythmia. She was admitted in 5/14 with pulmonary edema in the setting of hypertensive crisis complicated by PEA arrest. She was switched to dofetilide.  Breakthrough afib recently seen on event monitor that was placed by Dr. Johnsie Cancel and she is in the afib clinic to discuss options to continue in SR.Couple of events reviewed and both were shorted lived, one episode was 18 secs but with v rate up to 185 bpm.  Today, she denies symptoms of palpitations, chest pain, shortness of breath, orthopnea, PND, lower extremity edema, dizziness, presyncope, syncope, or neurologic sequela. The patient is tolerating medications without difficulties and is otherwise without complaint today.   Past Medical History:  Diagnosis Date  . Anemia   . Arthritis    fingers   . Atrial fibrillation ([place])    dx in 2008;  CHADS2-VASc=5;  changed to Tikosyn during admx 05/2013; Xarelto started 05/2013  . Blood transfusion    1968  . Breast cancer ([place]) 2009   T1N0M0 DCIS right breast  . Breast cancer ([place]) 2013   a. reocurrence right breast;  b. s/p Taxotere, Cytoxan and XRT  . Carotid stenosis    a. dopplers 3/81:  RICA 8-29%, LICA 93-71% => repeat due in 01/2013;  b.  Carotid US (6/96): RICA 7-89%; LICA 38-10% - f/u 6 mos  . History of radiation therapy 07/10/12-08/13/12   right  breast/chest wall  . Hodgkin's disease    stage 2b;  s/p XRT 1988  . HTN (hypertension)   . Hypothyroidism    2/2 XRT for Hodgkins Lymphoma  . LBBB (left bundle branch block)   . Mucositis 04/29/2012  . NICM (nonischemic cardiomyopathy) ([place])    a. EF as low as 45%;  b. LHC 5/05:  LM 10%, pLAD 30%, EF 45-50%,   c. Echo 5/13:  EF 60-65%, Gr 1 diast dysfn, MAC;   d.  Echo 11/13:  EF 60-65%, Gr 1 diast dysfn, Tr AI, mild MR;  d.  Echo 05/10/13: EF 17-51%, grade 1 diastolic dysfunction, PASP 36, small effusion.  . PEA (Pulseless electrical activity) ([place])    a. admx 05/2013 for APE in setting of HTN emergency c/b PEA arrest and asp pneumonia, VDRF, AFib with RVR  . Raynaud's disease   . Status post radiation therapy 1988   mantle and periaortic    Past Surgical History:  Procedure Laterality Date  . BREAST LUMPECTOMY  2009   right, lymph node biopsy  . BTL  1971  . DILATION AND CURETTAGE OF UTERUS    . MASS EXCISION  03/28/2012   Procedure: EXCISION MASS;  Surgeon: Edward Jolly, MD;  Location: [facility];  Service: General;  Laterality: Right;  Excision right chest wall mass  . MASTECTOMY  2009   right breast  . POLYPECTOMY  1990   D&C (also  in 1977 and 1991)   . PORT-A-CATH REMOVAL  10/03/2012   Procedure: REMOVAL PORT-A-CATH;  Surgeon: Edward Jolly, MD;  Location[facility];  Service: General;  Laterality: N/A;  . PORTACATH PLACEMENT  04/16/2012   Procedure: INSERTION PORT-A-CATH;  Surgeon: Edward Jolly, MD;  Location: [facility];  Service: General;  Laterality: Left;  Placement of Port-a-Cath, left subclavian  . R vein ligation & stripping  1976  . Spleenectomy  1988   for Hodgkin's disease  . Thoractomy  1968  . TISSUE EXPANDER  REMOVAL W/ REPLACEMENT OF IMPLANT    . TISSUE EXPANDER PLACEMENT     right breast  . TONSILLECTOMY AND ADENOIDECTOMY  1965    Current Outpatient Prescriptions  Medication Sig Dispense Refill  . atorvastatin (LIPITOR) 10 MG  tablet Take 1 tablet (10 mg total) by mouth daily at 6 PM. 30 tablet 11  . calcium carbonate (OS-CAL) 600 MG TABS Take 600 mg by mouth at bedtime.     . carvedilol (COREG) 12.5 MG tablet Take 1 tablet (12.5 mg total) by mouth 2 (two) times daily with a meal. 180 tablet 3  . Cholecalciferol (VITAMIN D) 2000 UNITS CAPS Take 1 capsule by mouth daily.     Marland Kitchen dofetilide (TIKOSYN) 250 MCG capsule TAKE 1 CAPSULE(250 MCG) BY MOUTH EVERY 12 HOURS 180 capsule 2  . levothyroxine (SYNTHROID, LEVOTHROID) 100 MCG tablet Take 100 mcg by mouth daily before breakfast.    . losartan (COZAAR) 25 MG tablet Take 1 tablet (25 mg total) by mouth 2 (two) times daily. 180 tablet 3  . LUTEIN-ZEAXANTHIN PO Take 1 capsule by mouth every morning.    . magnesium oxide (MAG-OX 400) 400 MG tablet Take 400 mg by mouth 2 (two) times daily.     . Multiple Vitamin (MULTIVITAMIN) tablet Take 1 tablet by mouth every morning. "Natural Vitamin"    . OVER THE COUNTER MEDICATION Osteodenz Capsules: Take 1 capsule by mouth at bedtime    . OVER THE COUNTER MEDICATION Vegetarian Omega Green: Take 3 capsules by mouth daily    . OVER THE COUNTER MEDICATION Mental Clarity: Take 1 capsule by mouth every morning    . XARELTO 20 MG TABS tablet TAKE 1 TABLET BY MOUTH DAILY WITH DINNER 90 tablet 3   No current facility-administered medications for this encounter.     Allergies  Allergen Reactions  . Ace Inhibitors     Cough  . Benazepril     cough  . Contrast Media [Iodinated Diagnostic Agents]     Sneezing   . Epinephrine     REACTION: BUZZ; INCREASED HEART RATE  . Iohexol      Desc: SNEEZING, pt states this happened in 1988. Has had iv contrast since without being premedicated and has had no problems. Chest ct 2005- [place] 05/09/12   . Potassium-Containing Compounds     Projectile vomiting--liquid potassium   . Prednisolone Other (See Comments)    ARRYTHMIAS  . Taxotere [Docetaxel] Other (See Comments)    Fluid overload    Social  History   Social History  . Marital status: Married    Spouse name: Donnie  . Number of children: N/A  . Years of education: N/A   Occupational History  . [employer]   Social History Main Topics  . Smoking status: Never Smoker  . Smokeless tobacco: Never Used  . Alcohol use No  . Drug use: No  . Sexual activity: Not Currently  Comment: post menopausal, HRT x 8 yrs   Other Topics Concern  . Not on file   Social History Narrative   Regular exercise. Retired and married.     Family History  Problem Relation Age of Onset  . Hypertension Mother   . Cancer Neg Hx     ROS- All systems are reviewed and negative except as per the HPI above  Physical Exam: Vitals:   05/08/17 0928  BP: (!) 146/72  Pulse: 67  Weight: 137 lb 9.6 oz (62.4 kg)  Height: 5' 2"  (1.575 m)   Wt Readings from Last 3 Encounters:  05/08/17 137 lb 9.6 oz (62.4 kg)  04/21/17 137 lb 6.4 oz (62.3 kg)  12/01/16 142 lb 6.4 oz (64.6 kg)    Labs: Lab Results  Component Value Date   NA 141 04/21/2017   K 5.1 04/21/2017   CL 101 04/21/2017   CO2 24 04/21/2017   GLUCOSE 77 04/21/2017   BUN 19 04/21/2017   CREATININE 0.81 04/21/2017   CALCIUM 9.4 04/21/2017   PHOS 3.8 05/10/2013   MG 2.2 04/21/2017   Lab Results  Component Value Date   INR 1.71 08/14/2016   Lab Results  Component Value Date   CHOL 163 05/18/2013   HDL 26 (L) 05/18/2013   LDLCALC 102 (H) 05/18/2013   TRIG 175 (H) 05/18/2013     GEN- The patient is well appearing, alert and oriented x 3 today.   Head- normocephalic, atraumatic Eyes-  Sclera clear, conjunctiva pink Ears- hearing intact Oropharynx- clear Neck- supple, no JVP Lymph- no cervical lymphadenopathy Lungs- Clear to ausculation bilaterally, normal work of breathing Heart- Regular rate and rhythm, no murmurs, rubs or gallops, PMI not laterally displaced GI- soft, NT, ND, + BS Extremities- no clubbing, cyanosis, or edema MS- no significant  deformity or atrophy Skin- no rash or lesion Psych- euthymic mood, full affect Neuro- strength and sensation are intact  EKG-NSR at 67 bpm, pr int 130 ms, qrs int 126 ms,sd qtc 477 ms Epic records reviewed Echo-05/05/17-Study Conclusions Recent labs reviewed and TSH normal  - Left ventricle: The cavity size was normal. There was mild   concentric hypertrophy. Systolic function was normal. The   estimated ejection fraction was in the range of 55% to 60%. Wall   motion was normal; there were no regional wall motion   abnormalities. Features are consistent with a pseudonormal left   ventricular filling pattern, with concomitant abnormal relaxation   and increased filling pressure (grade 2 diastolic dysfunction).   Doppler parameters are consistent with high ventricular filling   pressure. - Aortic valve: Poorly visualized. Trileaflet; mildly thickened,   mildly calcified leaflets. There was trivial regurgitation. - Mitral valve: There was moderate regurgitation. - Left atrium: The atrium was mildly dilated.35 mm - Tricuspid valve: There was mild regurgitation. - Pulmonary arteries: PA peak pressure: 39 mm Hg (S). - Impressions: The right ventricular systolic pressure was   increased consistent with mild pulmonary hypertension.  Impressions:  - Normal LVF with EF 60-65% with moderate MR, mild pulmonary htn   with PASP 77mHg. Compared to prior echo, MR remains moderate and   PASP has increased form 33 to 370mg. The right ventricular   systolic pressure was increased consistent with mild pulmonary   hypertension.    Assessment and Plan: 1. Paroxysmal afib Continue Tikosyn 250 mcg bid and carvedilol 12.5 mg bid Will request appointment with Dr. CaCurt Bearso discuss ablation I would think amiodarone with its side  effect profile should be avoided at this time Continue xarelto 20 mg for a chadsvasc score of at least 2   Sakib Noguez C. Krishav Mamone, [facility] [address] [place], [place] [address] [phone]

## 2017-05-08 NOTE — Telephone Encounter (Signed)
Called, spoke with pt. Informed pt of recommendation from Dr. Harrington Challenger (DOD). Pt should follow up with Dr. Curt Bears. Informed pt someone from our office will contact pt to set up appt. Will forward to Dr. Curt Bears and nurse.  Pt verbalized understanding.

## 2017-05-08 NOTE — Telephone Encounter (Signed)
Received Incoming call from [facility] with event telemetry report 05/08/17 at 2:07 PM (CT). Report Analysis: Atrial Flutter w/ variable conduction, Sinus Rhythm w/ couplet PACs/PACs. Rate (bmp) : 200.1.   Called pt. Pt stated she felt "woozy" and had to sit down. Pt stated she fells fine now. Informed I will forward to MD for recommendation. Pt verbalized understanding

## 2017-05-11 NOTE — Telephone Encounter (Signed)
Pt scheduled 6/5

## 2017-05-22 ENCOUNTER — Inpatient Hospital Stay (HOSPITAL_COMMUNITY): Admission: RE | Admit: 2017-05-22 | Payer: Medicare Other | Source: Ambulatory Visit | Admitting: Nurse Practitioner

## 2017-05-23 ENCOUNTER — Encounter: Payer: Self-pay | Admitting: Cardiology

## 2017-05-23 ENCOUNTER — Other Ambulatory Visit: Payer: Self-pay | Admitting: Cardiology

## 2017-05-23 ENCOUNTER — Encounter: Payer: Self-pay | Admitting: *Deleted

## 2017-05-23 ENCOUNTER — Ambulatory Visit (INDEPENDENT_AMBULATORY_CARE_PROVIDER_SITE_OTHER): Payer: Medicare Other | Admitting: Cardiology

## 2017-05-23 VITALS — BP 120/72 | HR 78 | Ht 62.0 in | Wt 135.0 lb

## 2017-05-23 DIAGNOSIS — I10 Essential (primary) hypertension: Secondary | ICD-10-CM

## 2017-05-23 DIAGNOSIS — I480 Paroxysmal atrial fibrillation: Secondary | ICD-10-CM

## 2017-05-23 DIAGNOSIS — Z01812 Encounter for preprocedural laboratory examination: Secondary | ICD-10-CM | POA: Diagnosis not present

## 2017-05-23 NOTE — Addendum Note (Signed)
Addended by: Stanton Kidney on: 05/23/2017 11:03 AM   Modules accepted: Orders

## 2017-05-23 NOTE — Progress Notes (Signed)
Electrophysiology Office Note   Date:  05/23/2017   ID:  Patricia, [name] 18-Apr-1946, MRN [id number]  PCP:  Patricia Arabian, MD  Cardiologist:  [name] Primary Electrophysiologist:  [name] Patricia Leeds, MD    Chief Complaint  Patient presents with  . Advice Only    PAF/Discuss Afib ablation     History of Present Illness: [name] is a 71 y.o. female who is being seen today for the evaluation of atrial fibrillation at the request of Patricia Arabian, MD. Presenting today for electrophysiology evaluation. Has a history of nonischemic cardio myopathy with an EF that has since improved, diastolic heart failure, hypertension, left bundle branch block, hypothyroidism, Hodgkin's disease, breast cancer, and carotid stenosis.  Radiation therapy for Hodgkin's lymphoma in 1998, mastectomy in 2009 and recurrent breast cancer in 2013. At 45% in the past which is since improved. Flecainide was started in 2013 for recurrent atrial fibrillation. Treadmill test was negative for arrhythmia. Admitted for pulmonary edema on 5/14 in the setting of hypertensive crisis compensated by PEA arrest. She is also dofetilide at that time. She had breakthrough atrial fibrillation seen on monitor.    Today, she denies symptoms of palpitations, chest pain, shortness of breath, orthopnea, PND, lower extremity edema, claudication, dizziness, presyncope, syncope, bleeding, or neurologic sequela. The patient is tolerating medications without difficulties.    Past Medical History:  Diagnosis Date  . Anemia   . Arthritis    fingers   . Atrial fibrillation ([place])    dx in 2008;  CHADS2-VASc=5;  changed to Tikosyn during admx 05/2013; Xarelto started 05/2013  . Blood transfusion    1968  . Breast cancer ([place]) 2009   T1N0M0 DCIS right breast  . Breast cancer ([place]) 2013   a. reocurrence right breast;  b. s/p Taxotere, Cytoxan and XRT  . Carotid stenosis    a. dopplers 8/33:  RICA 8-25%, LICA 05-39% => repeat  due in 01/2013;  b.  Carotid US (7/67): RICA 3-41%; LICA 93-79% - f/u 6 mos  . History of radiation therapy 07/10/12-08/13/12   right breast/chest wall  . Hodgkin's disease    stage 2b;  s/p XRT 1988  . HTN (hypertension)   . Hypothyroidism    2/2 XRT for Hodgkins Lymphoma  . LBBB (left bundle branch block)   . Mucositis 04/29/2012  . NICM (nonischemic cardiomyopathy) ([place])    a. EF as low as 45%;  b. LHC 5/05:  LM 10%, pLAD 30%, EF 45-50%,   c. Echo 5/13:  EF 60-65%, Gr 1 diast dysfn, MAC;   d.  Echo 11/13:  EF 60-65%, Gr 1 diast dysfn, Tr AI, mild MR;  d.  Echo 05/10/13: EF 02-40%, grade 1 diastolic dysfunction, PASP 36, small effusion.  . PEA (Pulseless electrical activity) ([place])    a. admx 05/2013 for APE in setting of HTN emergency c/b PEA arrest and asp pneumonia, VDRF, AFib with RVR  . Raynaud's disease   . Status post radiation therapy 1988   mantle and periaortic    Past Surgical History:  Procedure Laterality Date  . BREAST LUMPECTOMY  2009   right, lymph node biopsy  . BTL  1971  . DILATION AND CURETTAGE OF UTERUS    . MASS EXCISION  03/28/2012   Procedure: EXCISION MASS;  Surgeon: Patricia Jolly, MD;  Location: [facility];  Service: General;  Laterality: Right;  Excision right chest wall mass  . MASTECTOMY  2009   right breast  .  POLYPECTOMY  1990   D&C (also in 1977 and [place])   . PORT-A-CATH REMOVAL  10/03/2012   Procedure: REMOVAL PORT-A-CATH;  Surgeon: Patricia Jolly, MD;  [facility];  Service: General;  Laterality: N/A;  . PORTACATH PLACEMENT  04/16/2012   Procedure: INSERTION PORT-A-CATH;  Surgeon: Patricia Jolly, MD;  [facility];  Service: General;  Laterality: Left;  Placement of Port-a-Cath, left subclavian  . R vein ligation & stripping  1976  . Spleenectomy  1988   for Hodgkin's disease  . Thoractomy  1968  . TISSUE EXPANDER  REMOVAL W/ REPLACEMENT OF IMPLANT    . TISSUE EXPANDER PLACEMENT     right breast  .  TONSILLECTOMY AND ADENOIDECTOMY  1965     Current Outpatient Prescriptions  Medication Sig Dispense Refill  . atorvastatin (LIPITOR) 10 MG tablet Take 1 tablet (10 mg total) by mouth daily at 6 PM. 30 tablet 11  . calcium carbonate (OS-CAL) 600 MG TABS Take 600 mg by mouth at bedtime.     . carvedilol (COREG) 12.5 MG tablet Take 1 tablet (12.5 mg total) by mouth 2 (two) times daily with a meal. 180 tablet 3  . Cholecalciferol (VITAMIN D) 2000 UNITS CAPS Take 1 capsule by mouth daily.     Marland Kitchen dofetilide (TIKOSYN) 250 MCG capsule TAKE 1 CAPSULE(250 MCG) BY MOUTH EVERY 12 HOURS 180 capsule 2  . levothyroxine (SYNTHROID, LEVOTHROID) 100 MCG tablet Take 100 mcg by mouth daily before breakfast.    . losartan (COZAAR) 25 MG tablet Take 1 tablet (25 mg total) by mouth 2 (two) times daily. 180 tablet 3  . LUTEIN-ZEAXANTHIN PO Take 1 capsule by mouth every morning.    . magnesium oxide (MAG-OX 400) 400 MG tablet Take 400 mg by mouth 2 (two) times daily.     . Multiple Vitamin (MULTIVITAMIN) tablet Take 1 tablet by mouth every morning. "Natural Vitamin"    . OVER THE COUNTER MEDICATION Osteodenz Capsules: Take 1 capsule by mouth at bedtime    . OVER THE COUNTER MEDICATION Vegetarian Omega Green: Take 3 capsules by mouth daily    . OVER THE COUNTER MEDICATION Mental Clarity: Take 1 capsule by mouth every morning    . XARELTO 20 MG TABS tablet TAKE 1 TABLET BY MOUTH DAILY WITH DINNER 90 tablet 3   No current facility-administered medications for this visit.     Allergies:   Ace inhibitors; Benazepril; Contrast media [iodinated diagnostic agents]; Epinephrine; Iohexol; Potassium-containing compounds; Prednisolone; and Taxotere [docetaxel]   Social History:  The patient  reports that she has never smoked. She has never used smokeless tobacco. She reports that she does not drink alcohol or use drugs.   Family History:  The patient's family history includes Hypertension in her mother.    ROS:  Please see  the history of present illness.   Otherwise, review of systems is positive for Fatigue, palpitations, cough.   All other systems are reviewed and negative.    PHYSICAL EXAM: VS:  BP 120/72   Pulse 78   Ht _0  (1.575 m)   Wt 135 lb (61.2 kg)   LMP 03/08/1995   BMI 24.69 kg/m  , BMI Body mass index is 24.69 kg/m. GEN: Well nourished, well developed, in no acute distress  HEENT: normal  Neck: no JVD, carotid bruits, or masses Cardiac: RRR; no murmurs, rubs, or gallops,no edema  Respiratory:  clear to auscultation bilaterally, normal work of breathing GI: soft, nontender, nondistended, +  BS MS: no deformity or atrophy  Skin: warm and dry Neuro:  Strength and sensation are intact Psych: euthymic mood, full affect  EKG:  EKG is not ordered today. Personal review of the ekg ordered 05/08/17 shows sinus rhythm, rate 67, right bundle branch block, cannot rule out old septal infarction  Recent Labs: 08/14/2016: Hemoglobin 13.8 04/21/2017: BUN 19; Creatinine, Ser 0.81; Magnesium 2.2; Platelets 342; Potassium 5.1; Sodium 141; TSH 1.990    Lipid Panel     Component Value Date/Time   CHOL 163 05/18/2013 0440   TRIG 175 (H) 05/18/2013 0440   HDL 26 (L) 05/18/2013 0440   CHOLHDL 6.3 05/18/2013 0440   VLDL 35 05/18/2013 0440   LDLCALC 102 (H) 05/18/2013 0440     Wt Readings from Last 3 Encounters:  05/23/17 135 lb (61.2 kg)  05/08/17 137 lb 9.6 oz (62.4 kg)  04/21/17 137 lb 6.4 oz (62.3 kg)      Other studies Reviewed: Additional studies/ records that were reviewed today include: TTE 05/05/17, SPECT 05/05/17  Review of the above records today demonstrates:  - Left ventricle: The cavity size was normal. There was mild   concentric hypertrophy. Systolic function was normal. The   estimated ejection fraction was in the range of 55% to 60%. Wall   motion was normal; there were no regional wall motion   abnormalities. Features are consistent with a pseudonormal left   ventricular  filling pattern, with concomitant abnormal relaxation   and increased filling pressure (grade 2 diastolic dysfunction).   Doppler parameters are consistent with high ventricular filling   pressure. - Aortic valve: Poorly visualized. Trileaflet; mildly thickened,   mildly calcified leaflets. There was trivial regurgitation. - Mitral valve: There was moderate regurgitation. - Left atrium: The atrium was mildly dilated. - Tricuspid valve: There was mild regurgitation. - Pulmonary arteries: PA peak pressure: 39 mm Hg (S). - Impressions: The right ventricular systolic pressure was   increased consistent with mild pulmonary hypertension.    Nuclear stress EF: 58%.  Defect 1: There is a medium defect of mild severity present in the basal anteroseptal and mid anteroseptal location.  This is a low risk study.  The left ventricular ejection fraction is normal (55-65%).   Low risk stress nuclear study with fixed septal defect likely related to LBBB; no ischemia; EF 58 with mild septal hypokinesis.  ASSESSMENT AND PLAN:  1.  Paroxysmal atrial fibrillation: Currently on Tikosyn 250 as well as carvedilol. On Xarelto for stroke prevention. Her sister further options of A. fib therapy. Discussed both ablation and amiodarone. Risks and benefits of ablation were discussed. Risks include bleeding, tamponade, heart block, stroke, and damage to surrounding organs. She understands these risks and has agreed to the procedure.  This patients CHA2DS2-VASc Score and unadjusted Ischemic Stroke Rate (% per year) is equal to 2.2 % stroke rate/year from a score of 2  Above score calculated as 1 point each if present [CHF, HTN, DM, Vascular=MI/PAD/Aortic Plaque, Age if 65-74, or Female] Above score calculated as 2 points each if present [Age > 75, or Stroke/TIA/TE]  2. Chronic diastolic heart failure: Currently on beta blocker and ACE inhibitor. Well-controlled today. Does not appear volume overloaded  3.  Hypertension: Controlled on beta blocker, ACE inhibitor.     Current medicines are reviewed at length with the patient today.   The patient does not have concerns regarding her medicines.  The following changes were made today:  none  Labs/ tests ordered today include:  No orders of the defined types were placed in this encounter.    Disposition:   FU with Eulla Kochanowski 3 months  Signed, Cathye Kreiter Patricia Leeds, MD  05/23/2017 10:37 AM     [facility] [address] [place] [address] [phone] (office) [phone] (fax)

## 2017-05-23 NOTE — Patient Instructions (Addendum)
Medication Instructions:    Your physician recommends that you continue on your current medications as directed. Please refer to the Current Medication list given to you today.  - If you need a refill on your cardiac medications before your next appointment, please call your pharmacy.   Labwork:  Your physician recommends that you return for pre procedure lab work between: 6/29 - 7/6  Testing/Procedures: Your physician has requested that you have cardiac CT (within 7 days prior to your procedure). Cardiac computed tomography (CT) is a painless test that uses an x-ray machine to take clear, detailed pictures of your heart. For further information please visit www.cardiosmart.org  The office will call you to schedule this procedure.   Your physician has recommended that you have an ablation. Catheter ablation is a medical procedure used to treat some cardiac arrhythmias (irregular heartbeats). During catheter ablation, a long, thin, flexible tube is put into a blood vessel in your groin (upper thigh), or neck. This tube is called an ablation catheter. It is then guided to your heart through the blood vessel. Radio frequency waves destroy small areas of heart tissue where abnormal heartbeats may cause an arrhythmia to start. Please see the instruction sheet given to you today.  Follow-Up:  Your physician recommends that you schedule a follow-up appointment between 6/25 - 7/12 with Dr. Curt Bears.   Your physician recommends that you schedule a follow-up appointment in: 4 weeks, after your procedure on 06/30/17, with Roderic Palau in the AFib clinic.   Your physician recommends that you schedule a follow-up appointment in: 3 months, after your procedure on 06/30/17, with Dr. Curt Bears.  Thank you for choosing [facility]!!   [name], RN [phone]  Any Other Special Instructions Will Be Listed Below (If Applicable).   Cardiac Ablation Cardiac ablation is a procedure to disable  (ablate) a small amount of heart tissue in very specific places. The heart has many electrical connections. Sometimes these connections are abnormal and can cause the heart to beat very fast or irregularly. Ablating some of the problem areas can improve the heart rhythm or return it to normal. Ablation may be done for people who:  Have Wolff-Parkinson-White syndrome.  Have fast heart rhythms (tachycardia).  Have taken medicines for an abnormal heart rhythm (arrhythmia) that were not effective or caused side effects.  Have a high-risk heartbeat that may be life-threatening.  During the procedure, a small incision is made in the neck or the groin, and a long, thin, flexible tube (catheter) is inserted into the incision and moved to the heart. Small devices (electrodes) on the tip of the catheter will send out electrical currents. A type of X-ray (fluoroscopy) will be used to help guide the catheter and to provide images of the heart. Tell a health care provider about:  Any allergies you have.  All medicines you are taking, including vitamins, herbs, eye drops, creams, and over-the-counter medicines.  Any problems you or family members have had with anesthetic medicines.  Any blood disorders you have.  Any surgeries you have had.  Any medical conditions you have, such as kidney failure.  Whether you are pregnant or may be pregnant. What are the risks? Generally, this is a safe procedure. However, problems may occur, including:  Infection.  Bruising and bleeding at the catheter insertion site.  Bleeding into the chest, especially into the sac that surrounds the heart. This is a serious complication.  Stroke or blood clots.  Damage to other structures or organs.  Allergic reaction to medicines or dyes.  Need for a permanent pacemaker if the normal electrical system is damaged. A pacemaker is a small computer that sends electrical signals to the heart and helps your heart beat  normally.  The procedure not being fully effective. This may not be recognized until months later. Repeat ablation procedures are sometimes required.  What happens before the procedure?  Follow instructions from your health care provider about eating or drinking restrictions.  Ask your health care provider about: ? Changing or stopping your regular medicines. This is especially important if you are taking diabetes medicines or blood thinners. ? Taking medicines such as aspirin and ibuprofen. These medicines can thin your blood. Do not take these medicines before your procedure if your health care provider instructs you not to.  Plan to have someone take you home from the hospital or clinic.  If you will be going home right after the procedure, plan to have someone with you for 24 hours. What happens during the procedure?  To lower your risk of infection: ? Your health care team will wash or sanitize their hands. ? Your skin will be washed with soap. ? Hair may be removed from the incision area.  An IV tube will be inserted into one of your veins.  You will be given a medicine to help you relax (sedative).  The skin on your neck or groin will be numbed.  An incision will be made in your neck or your groin.  A needle will be inserted through the incision and into a large vein in your neck or groin.  A catheter will be inserted into the needle and moved to your heart.  Dye may be injected through the catheter to help your surgeon see the area of the heart that needs treatment.  Electrical currents will be sent from the catheter to ablate heart tissue in desired areas. There are three types of energy that may be used to ablate heart tissue: ? Heat (radiofrequency energy). ? Laser energy. ? Extreme cold (cryoablation).  When the necessary tissue has been ablated, the catheter will be removed.  Pressure will be held on the catheter insertion area to prevent excessive  bleeding.  A bandage (dressing) will be placed over the catheter insertion area. The procedure may vary among health care providers and hospitals. What happens after the procedure?  Your blood pressure, heart rate, breathing rate, and blood oxygen level will be monitored until the medicines you were given have worn off.  Your catheter insertion area will be monitored for bleeding. You will need to lie still for a few hours to ensure that you do not bleed from the catheter insertion area.  Do not drive for 24 hours or as long as directed by your health care provider. Summary  Cardiac ablation is a procedure to disable (ablate) a small amount of heart tissue in very specific places. Ablating some of the problem areas can improve the heart rhythm or return it to normal.  During the procedure, electrical currents will be sent from the catheter to ablate heart tissue in desired areas. This information is not intended to replace advice given to you by your health care provider. Make sure you discuss any questions you have with your health care provider. Document Released: 04/23/2009 Document Revised: 10/24/2016 Document Reviewed: 10/24/2016 Elsevier Interactive Patient Education  [employer].

## 2017-05-29 ENCOUNTER — Ambulatory Visit (HOSPITAL_COMMUNITY)
Admission: RE | Admit: 2017-05-29 | Discharge: 2017-05-29 | Disposition: A | Discharge status: Home / Self Care | Payer: Medicare Other | Source: Ambulatory Visit | Attending: Cardiology | Admitting: Cardiology

## 2017-05-29 ENCOUNTER — Other Ambulatory Visit: Payer: Self-pay | Admitting: Physician Assistant

## 2017-05-29 ENCOUNTER — Other Ambulatory Visit: Payer: Self-pay | Admitting: *Deleted

## 2017-05-29 ENCOUNTER — Institutional Professional Consult (permissible substitution): Payer: Medicare Other | Admitting: Cardiology

## 2017-05-29 DIAGNOSIS — I6523 Occlusion and stenosis of bilateral carotid arteries: Secondary | ICD-10-CM | POA: Diagnosis not present

## 2017-05-29 DIAGNOSIS — I739 Peripheral vascular disease, unspecified: Secondary | ICD-10-CM

## 2017-05-29 DIAGNOSIS — I779 Disorder of arteries and arterioles, unspecified: Secondary | ICD-10-CM | POA: Diagnosis not present

## 2017-05-29 NOTE — Telephone Encounter (Signed)
error 

## 2017-05-30 ENCOUNTER — Telehealth: Payer: Self-pay

## 2017-05-30 DIAGNOSIS — I739 Peripheral vascular disease, unspecified: Principal | ICD-10-CM

## 2017-05-30 DIAGNOSIS — I779 Disorder of arteries and arterioles, unspecified: Secondary | ICD-10-CM

## 2017-05-30 NOTE — Telephone Encounter (Signed)
Spoke with patient about results from her carotid duplex. Patient verbalized concern about her right carotid and was confused as to why only left side was mentioned in result note from Dr. Johnsie Cancel. I explained to patient that her right side was within normal range and no change from her last duplex in December 2017. Patient became very argumentative. I explained that her result after Dr. Johnsie Cancel viewing duplex was actually better than the initial result which was 60-79% in her chart compared to the 40-59% Dr. Johnsie Cancel resulted. Patient continued to be argumentative after many attempts to answer her questions and explain in detail answer to all of her questions. Patient verbalized and agreed to do follow up duplex in one year. Patient verbalized that she will see Dr. Johnsie Cancel at her next scheduled appointment with him on Friday, June 15th. I advised patient that if she had anymore questions that she could ask Dr. Johnsie Cancel at her next appointment.  Patient agreed to continue with her current treatment plan.

## 2017-05-30 NOTE — Telephone Encounter (Signed)
-----   Message from Josue Hector, MD sent at 05/30/2017  1:09 PM EDT ----- 72-09% LICA stenosis.  F/U duplex in one year

## 2017-05-31 NOTE — Progress Notes (Signed)
Patient ID: [name], female   DOB: [birth year], 71 y.o.   MRN: [id number]    Cardiology Office Note   Date:  06/02/2017   ID:  [name], [name] 1946/08/09, MRN [id number]  PCP:  Patricia Arabian, MD  Cardiologist:  Patricia. Jenkins [name]   Electrophysiologist:  n/a  No chief complaint on file.    History of Present Illness: [name] is a 71 y.o. female with a hx of NICM with improved EF, diastolic HF, HTN, LBBB, hypothyroidism, Hodgkin's disease, breast CA, PAF, carotid stenosis. Patient underwent radiation therapy for Hodgkin's lymphoma in 1998, mastectomy for breast cancer in 2009 and recurrent breast CA in 2013. EF has been as low as 45% in the past. This is improved to normal over time. Flecainide was started in 12/13 for recurrent A. fib. ETT was negative for proarrhythmia. She was admitted in 5/14 with pulmonary edema in the setting of hypertensive crisis complicated by PEA arrest. She was switched to dofetilide. For recurrent PAF  In May 2018 had more breakthrough PAF documented by monitor Myovue 05/05/17 no ischemia EF 58% Echo 05/05/17 EF 55-60% moderate MR mild LAE Seen by [name] and plans for ablation  Reviewed duplex 12/27/30 and has 35-57% LICA stenosis systolic velocity over 39msec old criteria We had called this 60-79%   Studies/Reports Reviewed Today:  Echo 09/03/15 EF 50-55%, apical anteroseptal HK, trivial AI, MAC, moderate MR, PASP 32 mmHg, trivial effusion  Carotid [place]12/4/17  R 40-59%; L 60-79%  ETT 12/13 normal - no evidence of ischemia by ST analysis  LHC 5/05 LAD 30%  Called office 04/20/17 with palpitations for last 5 days .  Episodes latter in day can last 2 minutes to 30 minutes Mild dyspnea with it No chest pain. Palpitations tend to be rapid. Compliant with meds.    Past Medical History:  Diagnosis Date  . Anemia   . Arthritis    fingers   . Atrial [place] ([facility]    dx in 2008;  CHADS2-VASc=5;  changed to Tikosyn during admx  05/2013; Xarelto started 05/2013  . Blood transfusion    1968  . Breast cancer ([facility] 2009   T1N0M0 DCIS right breast  . Breast [place] [place]   a. reocurrence right breast;  b. s/p Taxotere, Cytoxan and XRT  . Carotid stenosis    a. dopplers 83/22  RICA 00-25% LICA 642-70%=> repeat due in 01/2013;  b.  Carotid [place](96/23: RICA 17-62% LICA 683-15%- f/u 6 mos  . History of radiation therapy 07/10/12-08/13/12   right breast/chest wall  . Hodgkin's disease    stage 2b;  s/p XRT 1988  . HTN (hypertension)   . Hypothyroidism    2/2 XRT for Hodgkins Lymphoma  . LBBB (left bundle branch block)   . Mucositis 04/29/2012  . NICM (nonischemic cardiomyopathy) ([place]    a. EF as low as 45%;  b. LHC 5/05:  LM 10%, pLAD 30%, EF 45-50%,   c. Echo 5/13:  EF 60-65%, Gr 1 diast dysfn, MAC;   d.  Echo 11/13:  EF 60-65%, Gr 1 diast dysfn, Tr AI, mild MR;  d.  Echo 05/10/13: EF 517-61% grade 1 diastolic dysfunction, PASP 36, small effusion.  . PEA (Pulseless electrical activity) ([facility]    a. admx 05/2013 for APE in setting of HTN emergency c/b PEA arrest and asp pneumonia, VDRF, AFib with RVR  . Raynaud's disease   . Status post radiation therapy 1988  mantle and periaortic     Past Surgical History:  Procedure Laterality Date  . BREAST LUMPECTOMY  2009   right, lymph node biopsy  . BTL  1971  . DILATION AND CURETTAGE OF UTERUS    . MASS EXCISION  03/28/2012   Procedure: EXCISION MASS;  Surgeon: Patricia Jolly, MD;  Location: [facility];  Service: General;  Laterality: Right;  Excision right chest wall mass  . MASTECTOMY  2009   right breast  . POLYPECTOMY  1990   D&C (also in 1977 and [place])   . PORT-A-CATH REMOVAL  10/03/2012   Procedure: REMOVAL PORT-A-CATH;  Surgeon: Patricia Jolly, MD;  Location: [facility];  Service: General;  Laterality: N/A;  . PORTACATH PLACEMENT  04/16/2012   Procedure: INSERTION PORT-A-CATH;  Surgeon: Patricia Jolly, MD;  Location: [facility];  Service:  General;  Laterality: Left;  Placement of Port-a-Cath, left subclavian  . R vein ligation & stripping  1976  . Spleenectomy  1988   for Hodgkin's disease  . Thoractomy  1968  . TISSUE EXPANDER  REMOVAL W/ REPLACEMENT OF IMPLANT    . TISSUE EXPANDER PLACEMENT     right breast  . TONSILLECTOMY AND ADENOIDECTOMY  1965     Current Outpatient Prescriptions  Medication Sig Dispense Refill  . atorvastatin (LIPITOR) 10 MG tablet Take 1 tablet (10 mg total) by mouth daily at 6 PM. 30 tablet 11  . calcium carbonate (OS-CAL) 600 MG TABS Take 600 mg by mouth at bedtime.     . Cholecalciferol (VITAMIN D) 2000 UNITS CAPS Take 1 capsule by mouth daily.     Marland Kitchen dofetilide (TIKOSYN) 250 MCG capsule TAKE 1 CAPSULE(250 MCG) BY MOUTH EVERY 12 HOURS 180 capsule 2  . levothyroxine (SYNTHROID, LEVOTHROID) 100 MCG tablet Take 100 mcg by mouth daily before breakfast.    . losartan (COZAAR) 25 MG tablet Take 1 tablet (25 mg total) by mouth 2 (two) times daily. 180 tablet 3  . LUTEIN-ZEAXANTHIN PO Take 1 capsule by mouth every morning.    . magnesium oxide (MAG-OX 400) 400 MG tablet Take 400 mg by mouth 2 (two) times daily.     . Multiple Vitamin (MULTIVITAMIN) tablet Take 1 tablet by mouth every morning. "Natural Vitamin"    . OVER THE COUNTER MEDICATION Osteodenz Capsules: Take 1 capsule by mouth at bedtime    . OVER THE COUNTER MEDICATION Vegetarian Omega Green: Take 3 capsules by mouth daily    . OVER THE COUNTER MEDICATION Mental Clarity: Take 1 capsule by mouth every morning    . XARELTO 20 MG TABS tablet TAKE 1 TABLET BY MOUTH DAILY WITH DINNER 90 tablet 3  . carvedilol (COREG) 12.5 MG tablet Take 1 tablet (12.5 mg total) by mouth 2 (two) times daily with a meal. 180 tablet 3   No current facility-administered medications for this visit.     Allergies:   Ace inhibitors; Benazepril; Contrast media [iodinated diagnostic agents]; Epinephrine; Iohexol; Potassium-containing compounds; Prednisolone; and Taxotere  [docetaxel]    Social History:  The patient  reports that she has never smoked. She has never used smokeless tobacco. She reports that she does not drink alcohol or use drugs.   Family History:  The patient's family history includes Hypertension in her mother.    ROS:   Please see the history of present illness.   Review of Systems  All other systems reviewed and are negative.     PHYSICAL EXAM: VS:  BP  114/60   Pulse 68   Ht 5' 2"  (1.575 m)   Wt 60.8 kg (134 lb 1.9 oz)   LMP 03/08/1995   SpO2 97%   BMI 24.53 kg/m     Wt Readings from Last 3 Encounters:  06/02/17 60.8 kg (134 lb 1.9 oz)  05/23/17 61.2 kg (135 lb)  05/08/17 62.4 kg (137 lb 9.6 oz)     BP 114/60   Pulse 68   Ht 5' 2"  (1.575 m)   Wt 60.8 kg (134 lb 1.9 oz)   LMP 03/08/1995   SpO2 97%   BMI 24.53 kg/m  Affect appropriate Healthy:  appears stated age 23: normal Neck supple with no adenopathy JVP norma left bruits no thyromegaly Lungs clear with no wheezing and good diaphragmatic motion Heart:  S1/S2 no murmur, no rub, gallop or click PMI normal Abdomen: benighn, BS positve, no tenderness, no AAA no bruit.  No HSM or HJR Distal pulses intact with no bruits No edema Neuro non-focal Skin warm and dry No muscular weakness     EKG:   SR rate 64 IVcD QT 462 08/24/16  04/21/17  SR rate 71 Poor R wave progression inferior lateral T wave Changes QT 450    Recent Labs: 04/21/2017: BUN 19; Creatinine, Ser 0.81; Hemoglobin 13.2; Magnesium 2.2; Platelets 342; Potassium 5.1; Sodium 141; TSH 1.990    Lipid Panel    Component Value Date/Time   CHOL 163 05/18/2013 0440   TRIG 175 (H) 05/18/2013 0440   HDL 26 (L) 05/18/2013 0440   CHOLHDL 6.3 05/18/2013 0440   VLDL 35 05/18/2013 0440   LDLCALC 102 (H) 05/18/2013 0440      ASSESSMENT AND PLAN:  1. Chronic Diastolic CHF:  Volume stable.  She is back on beta-blocker, angiotensin receptor blocker   2. PAF:  Break through PAF on low dose Tikosyn  Long QT with higher dose Seen by [name] 05/23/17 And plans for ablation   3. HTN:  Controlled. Lasix stopped   4. Hypothyroidism: FU with PCP.  TSH/T4 today   5. Carotid stenosis:   39-03% LICA f/u duplex June 2019  ASA    6. Breast Cancer in remission f/u oncology  3D MRI in July     Disposition:    Ablation in July and f/u with me in 6 months She will continue her xarelto up until time of procedure And not hold it per [facility]   [name]

## 2017-06-02 ENCOUNTER — Ambulatory Visit (INDEPENDENT_AMBULATORY_CARE_PROVIDER_SITE_OTHER): Payer: Medicare Other | Admitting: Cardiovascular Disease

## 2017-06-02 ENCOUNTER — Encounter: Payer: Self-pay | Admitting: Cardiovascular Disease

## 2017-06-02 VITALS — BP 114/60 | HR 68 | Ht 62.0 in | Wt 134.1 lb

## 2017-06-02 DIAGNOSIS — I480 Paroxysmal atrial fibrillation: Secondary | ICD-10-CM

## 2017-06-02 DIAGNOSIS — I6523 Occlusion and stenosis of bilateral carotid arteries: Secondary | ICD-10-CM | POA: Diagnosis not present

## 2017-06-02 MED ORDER — RIVAROXABAN 20 MG PO TABS: ORAL_TABLET | ORAL | 3 refills | Status: DC

## 2017-06-02 MED ORDER — CARVEDILOL 12.5 MG PO TABS: 12.5000 mg | ORAL_TABLET | Freq: Two times a day (BID) | ORAL | 0 refills | Status: DC

## 2017-06-02 NOTE — Patient Instructions (Signed)
Medication Instructions:   A REFILL WAS SENT IN FOR THE COREG 12.5 MG TWICE DAILY  Labwork: NONE ORDRED  Testing/Procedures: NONE ORDERED  Follow-Up: Your physician wants you to follow-up in: [place] DR. Johnsie Cancel You will receive a reminder letter in the mail two months in advance. If you don't receive a letter, please call our office to schedule the follow-up appointment.   Any Other Special Instructions Will Be Listed Below (If Applicable).     If you need a refill on your cardiac medications before your next appointment, please call your pharmacy.

## 2017-06-20 ENCOUNTER — Encounter: Payer: Self-pay | Admitting: Cardiology

## 2017-06-20 ENCOUNTER — Ambulatory Visit (INDEPENDENT_AMBULATORY_CARE_PROVIDER_SITE_OTHER): Payer: Medicare Other | Admitting: Cardiology

## 2017-06-20 ENCOUNTER — Other Ambulatory Visit: Payer: Self-pay

## 2017-06-20 VITALS — BP 180/90 | HR 66 | Ht 62.0 in | Wt 135.8 lb

## 2017-06-20 DIAGNOSIS — I481 Persistent atrial fibrillation: Secondary | ICD-10-CM

## 2017-06-20 DIAGNOSIS — I480 Paroxysmal atrial fibrillation: Secondary | ICD-10-CM

## 2017-06-20 DIAGNOSIS — I6523 Occlusion and stenosis of bilateral carotid arteries: Secondary | ICD-10-CM | POA: Diagnosis not present

## 2017-06-20 DIAGNOSIS — I4819 Other persistent atrial fibrillation: Secondary | ICD-10-CM

## 2017-06-20 DIAGNOSIS — I10 Essential (primary) hypertension: Secondary | ICD-10-CM

## 2017-06-20 DIAGNOSIS — I5032 Chronic diastolic (congestive) heart failure: Secondary | ICD-10-CM | POA: Diagnosis not present

## 2017-06-20 DIAGNOSIS — Z01812 Encounter for preprocedural laboratory examination: Secondary | ICD-10-CM

## 2017-06-20 MED ORDER — LOSARTAN POTASSIUM 50 MG PO TABS: 50.0000 mg | ORAL_TABLET | Freq: Every day | ORAL | 3 refills | Status: DC

## 2017-06-20 MED ORDER — LOSARTAN POTASSIUM 50 MG PO TABS: 50.0000 mg | ORAL_TABLET | Freq: Two times a day (BID) | ORAL | 3 refills | Status: DC

## 2017-06-20 NOTE — Patient Instructions (Addendum)
Medication Instructions:   Your physician has recommended you make the following change in your medication: 1) INCREASE Losartan to 50 mg twice daily  - If you need a refill on your cardiac medications before your next appointment, please call your pharmacy.   Labwork:  Pre procedure labs today: BMET & CBC w/ diff  Testing/Procedures:  None ordered  Follow-Up: Your physician recommends that you schedule a follow-up appointment in: 3 months, after your procedure on 06/30/17, with Dr. Curt Bears.  Thank you for choosing [facility]!!   [name], RN [phone]  Any Other Special Instructions Will Be Listed Below (If Applicable).

## 2017-06-20 NOTE — Addendum Note (Signed)
Addended by: Eulis Foster on: 06/20/2017 12:44 PM   Modules accepted: Orders

## 2017-06-20 NOTE — Addendum Note (Signed)
Addended by: Eulis Foster on: 06/20/2017 12:43 PM   Modules accepted: Orders

## 2017-06-20 NOTE — Progress Notes (Signed)
Electrophysiology Office Note   Date:  06/20/2017   ID:  [name] 08/25/1946, MRN [id number]  PCP:  [name], MD  Cardiologist:  Johnsie Cancel Primary Electrophysiologist:  Kyna Blahnik Meredith Leeds, MD    Chief Complaint  Patient presents with  . Follow-up    PAF/H&P, Labs     History of Present Illness: [name] is a 71 y.o. female who is being seen today for the evaluation of atrial fibrillation at the request of [name], MD. Presenting today for electrophysiology evaluation. Has a history of nonischemic cardio myopathy with an EF that has since improved, diastolic heart failure, hypertension, left bundle branch block, hypothyroidism, Hodgkin's disease, breast cancer, and carotid stenosis.  Radiation therapy for Hodgkin's lymphoma in 1998, mastectomy in 2009 and recurrent breast cancer in 2013. EF 45% in the past which is since improved. Flecainide was started in 2013 for recurrent atrial fibrillation. Treadmill test was negative for arrhythmia. Admitted for pulmonary edema on 5/14 in the setting of hypertensive crisis compensated by PEA arrest. She is also dofetilide at that time. She had breakthrough atrial fibrillation seen on monitor.  Today, denies symptoms of palpitations, chest pain, shortness of breath, orthopnea, PND, lower extremity edema, claudication, dizziness, presyncope, syncope, bleeding, or neurologic sequela. The patient is tolerating medications without difficulties and is otherwise without complaint today. She is feeling weak and fatigued today. Her blood pressure is quite elevated today. Over the past few days, she has had more episodes of palpitations, mainly in the evening.  Past Medical History:  Diagnosis Date  . Anemia   . Arthritis    fingers   . Atrial fibrillation ([place])    dx in 2008;  CHADS2-VASc=5;  changed to Tikosyn during admx 05/2013; Xarelto started 05/2013  . Blood transfusion    1968  . Breast cancer ([place]) 2009   T1N0M0  DCIS right breast  . Breast cancer ([place]) 2013   a. reocurrence right breast;  b. s/p Taxotere, Cytoxan and XRT  . Carotid stenosis    a. dopplers 9/02:  RICA 4-09%, LICA 73-53% => repeat due in 01/2013;  b.  Carotid US (2/99): RICA 2-42%; LICA 68-34% - f/u 6 mos  . Fracture of left patella 12/2016  . History of radiation therapy 07/10/12-08/13/12   right breast/chest wall  . Hodgkin's disease    stage 2b;  s/p XRT 1988  . HTN (hypertension)   . Hypothyroidism    2/2 XRT for Hodgkins Lymphoma  . LBBB (left bundle branch block)   . Mucositis 04/29/2012  . NICM (nonischemic cardiomyopathy) ([place])    a. EF as low as 45%;  b. LHC 5/05:  LM 10%, pLAD 30%, EF 45-50%,   c. Echo 5/13:  EF 60-65%, Gr 1 diast dysfn, MAC;   d.  Echo 11/13:  EF 60-65%, Gr 1 diast dysfn, Tr AI, mild MR;  d.  Echo 05/10/13: EF 19-62%, grade 1 diastolic dysfunction, PASP 36, small effusion.  . PEA (Pulseless electrical activity) ([place])    a. admx 05/2013 for APE in setting of HTN emergency c/b PEA arrest and asp pneumonia, VDRF, AFib with RVR  . Raynaud's disease   . Status post radiation therapy 1988   mantle and periaortic    Past Surgical History:  Procedure Laterality Date  . BREAST LUMPECTOMY  2009   right, lymph node biopsy  . BTL  1971  . DILATION AND CURETTAGE OF UTERUS    . MASS EXCISION  03/28/2012  Procedure: EXCISION MASS;  Surgeon: Edward Jolly, MD;  Location: [facility];  Service: General;  Laterality: Right;  Excision right chest wall mass  . MASTECTOMY  2009   right breast  . POLYPECTOMY  1990   D&C (also in 1977 and [place])   . PORT-A-CATH REMOVAL  10/03/2012   Procedure: REMOVAL PORT-A-CATH;  Surgeon: Edward Jolly, MD;  Location: [facility];  Service: General;  Laterality: N/A;  . PORTACATH PLACEMENT  04/16/2012   Procedure: INSERTION PORT-A-CATH;  Surgeon: Edward Jolly, MD;  Location: [facility];  Service: General;  Laterality: Left;  Placement of Port-a-Cath, left subclavian   . R vein ligation & stripping  1976  . Spleenectomy  1988   for Hodgkin's disease  . Thoractomy  1968  . TISSUE EXPANDER  REMOVAL W/ REPLACEMENT OF IMPLANT    . TISSUE EXPANDER PLACEMENT     right breast  . TONSILLECTOMY AND ADENOIDECTOMY  1965     Current Outpatient Prescriptions  Medication Sig Dispense Refill  . atorvastatin (LIPITOR) 10 MG tablet Take 1 tablet (10 mg total) by mouth daily at 6 PM. 30 tablet 11  . calcium carbonate (OS-CAL) 600 MG TABS Take 600 mg by mouth at bedtime.     . carvedilol (COREG) 12.5 MG tablet Take 1 tablet (12.5 mg total) by mouth 2 (two) times daily with a meal. 60 tablet 0  . Cholecalciferol (VITAMIN D) 2000 UNITS CAPS Take 1 capsule by mouth daily.     Marland Kitchen dofetilide (TIKOSYN) 250 MCG capsule TAKE 1 CAPSULE(250 MCG) BY MOUTH EVERY 12 HOURS 180 capsule 2  . levothyroxine (SYNTHROID, LEVOTHROID) 100 MCG tablet Take 100 mcg by mouth daily before breakfast.    . losartan (COZAAR) 25 MG tablet Take 1 tablet (25 mg total) by mouth 2 (two) times daily. 180 tablet 3  . LUTEIN-ZEAXANTHIN PO Take 1 capsule by mouth every morning.    . magnesium oxide (MAG-OX 400) 400 MG tablet Take 400 mg by mouth 2 (two) times daily.     . Multiple Vitamin (MULTIVITAMIN) tablet Take 1 tablet by mouth every morning. "Natural Vitamin"    . OVER THE COUNTER MEDICATION Osteodenz Capsules: Take 1 capsule by mouth at bedtime    . OVER THE COUNTER MEDICATION Vegetarian Omega Green: Take 3 capsules by mouth daily    . OVER THE COUNTER MEDICATION Mental Clarity: Take 1 capsule by mouth every morning    . rivaroxaban (XARELTO) 20 MG TABS tablet TAKE 1 TABLET BY MOUTH DAILY WITH DINNER 90 tablet 3   No current facility-administered medications for this visit.     Allergies:   Ace inhibitors; Benazepril; Contrast media [iodinated diagnostic agents]; Epinephrine; Iohexol; Potassium-containing compounds; Prednisolone; and Taxotere [docetaxel]   Social History:  The patient  reports  that she has never smoked. She has never used smokeless tobacco. She reports that she does not drink alcohol or use drugs.   Family History:  The patient's family history includes Hypertension in her mother.    ROS:  Please see the history of present illness.   Otherwise, review of systems is positive for palpitations.   All other systems are reviewed and negative.     PHYSICAL EXAM: VS:  BP (!) 180/90   Pulse 66   Ht 5' 2"  (1.575 m)   Wt 135 lb 12.8 oz (61.6 kg)   LMP 03/08/1995   BMI 24.84 kg/m  , BMI Body mass index is 24.84 kg/m. GEN: Well nourished,  well developed, in no acute distress  HEENT: normal  Neck: no JVD, carotid bruits, or masses Cardiac: RRR; no murmurs, rubs, or gallops,no edema  Respiratory:  clear to auscultation bilaterally, normal work of breathing GI: soft, nontender, nondistended, + BS MS: no deformity or atrophy  Skin: warm and dry Neuro:  Strength and sensation are intact Psych: euthymic mood, full affect  EKG:  EKG is not ordered today. Personal review of the ekg ordered 05/08/17 shows sinus rhythm, LAE, RBBB  Recent Labs: 04/21/2017: BUN 19; Creatinine, Ser 0.81; Hemoglobin 13.2; Magnesium 2.2; Platelets 342; Potassium 5.1; Sodium 141; TSH 1.990    Lipid Panel     Component Value Date/Time   CHOL 163 05/18/2013 0440   TRIG 175 (H) 05/18/2013 0440   HDL 26 (L) 05/18/2013 0440   CHOLHDL 6.3 05/18/2013 0440   VLDL 35 05/18/2013 0440   LDLCALC 102 (H) 05/18/2013 0440     Wt Readings from Last 3 Encounters:  06/20/17 135 lb 12.8 oz (61.6 kg)  06/02/17 134 lb 1.9 oz (60.8 kg)  05/23/17 135 lb (61.2 kg)      Other studies Reviewed: Additional studies/ records that were reviewed today include: TTE 05/05/17, SPECT 05/05/17  Review of the above records today demonstrates:  - Left ventricle: The cavity size was normal. There was mild   concentric hypertrophy. Systolic function was normal. The   estimated ejection fraction was in the range of 55%  to 60%. Wall   motion was normal; there were no regional wall motion   abnormalities. Features are consistent with a pseudonormal left   ventricular filling pattern, with concomitant abnormal relaxation   and increased filling pressure (grade 2 diastolic dysfunction).   Doppler parameters are consistent with high ventricular filling   pressure. - Aortic valve: Poorly visualized. Trileaflet; mildly thickened,   mildly calcified leaflets. There was trivial regurgitation. - Mitral valve: There was moderate regurgitation. - Left atrium: The atrium was mildly dilated. - Tricuspid valve: There was mild regurgitation. - Pulmonary arteries: PA peak pressure: 39 mm Hg (S). - Impressions: The right ventricular systolic pressure was   increased consistent with mild pulmonary hypertension.    Nuclear stress EF: 58%.  Defect 1: There is a medium defect of mild severity present in the basal anteroseptal and mid anteroseptal location.  This is a low risk study.  The left ventricular ejection fraction is normal (55-65%).   Low risk stress nuclear study with fixed septal defect likely related to LBBB; no ischemia; EF 58 with mild septal hypokinesis.  ASSESSMENT AND PLAN:  1.  Paroxysmal atrial fibrillation: Currently on Tikosyn 250 as well as carvedilol. On Xarelto for stroke prevention. Plan for ablation 06/30/17.   This patients CHA2DS2-VASc Score and unadjusted Ischemic Stroke Rate (% per year) is equal to 3.2 % stroke rate/year from a score of 3  Above score calculated as 1 point each if present [CHF, HTN, DM, Vascular=MI/PAD/Aortic Plaque, Age if 65-74, or Female] Above score calculated as 2 points each if present [Age > 75, or Stroke/TIA/TE]   2. Chronic diastolic heart failure: Currently on beta blocker and ARB. Blood pressure is elevated today. Plan to increase her ARB.  3. Hypertension: Quite elevated. Plan to double her losartan today.  Current medicines are reviewed at length with  the patient today.   The patient does not have concerns regarding her medicines.  The following changes were made today:  double losartan to 50 mg twice a day  Labs/ tests ordered  today include:  No orders of the defined types were placed in this encounter.    Disposition:   FU with Mateus Rewerts 3 months  Signed, Alter Moss Meredith Leeds, MD  06/20/2017 11:55 AM     [facility] [address] [place] [address] [phone] (office) [phone] (fax)

## 2017-06-21 LAB — CBC WITH DIFFERENTIAL/PLATELET
BASOS: 1 %
Basophils Absolute: 0.1 10*3/uL (ref 0.0–0.2)
EOS (ABSOLUTE): 0.2 10*3/uL (ref 0.0–0.4)
EOS: 3 %
HEMATOCRIT: 40.1 % (ref 34.0–46.6)
HEMOGLOBIN: 13.2 g/dL (ref 11.1–15.9)
IMMATURE GRANS (ABS): 0 10*3/uL (ref 0.0–0.1)
IMMATURE GRANULOCYTES: 0 %
LYMPHS: 23 %
Lymphocytes Absolute: 1.8 10*3/uL (ref 0.7–3.1)
MCH: 30.6 pg (ref 26.6–33.0)
MCHC: 32.9 g/dL (ref 31.5–35.7)
MCV: 93 fL (ref 79–97)
MONOCYTES: 9 %
MONOS ABS: 0.7 10*3/uL (ref 0.1–0.9)
NEUTROS PCT: 64 %
Neutrophils Absolute: 5.2 10*3/uL (ref 1.4–7.0)
Platelets: 325 10*3/uL (ref 150–379)
RBC: 4.32 x10E6/uL (ref 3.77–5.28)
RDW: 13.6 % (ref 12.3–15.4)
WBC: 8.1 10*3/uL (ref 3.4–10.8)

## 2017-06-21 LAB — BASIC METABOLIC PANEL
BUN/Creatinine Ratio: 24 (ref 12–28)
BUN: 19 mg/dL (ref 8–27)
CALCIUM: 9.3 mg/dL (ref 8.7–10.3)
CO2: 26 mmol/L (ref 20–29)
CREATININE: 0.78 mg/dL (ref 0.57–1.00)
Chloride: 100 mmol/L (ref 96–106)
GFR calc Af Amer: 88 mL/min/{1.73_m2} (ref >59)
GFR, EST NON AFRICAN AMERICAN: 77 mL/min/{1.73_m2} (ref >59)
GLUCOSE: 89 mg/dL (ref 65–99)
Potassium: 4.6 mmol/L (ref 3.5–5.2)
Sodium: 141 mmol/L (ref 134–144)

## 2017-06-23 ENCOUNTER — Other Ambulatory Visit: Payer: Self-pay | Admitting: Cardiovascular Disease

## 2017-06-23 ENCOUNTER — Encounter (HOSPITAL_COMMUNITY): Payer: Self-pay

## 2017-06-23 ENCOUNTER — Ambulatory Visit (HOSPITAL_COMMUNITY)
Admission: RE | Admit: 2017-06-23 | Discharge: 2017-06-23 | Disposition: A | Discharge status: Home / Self Care | Payer: Medicare Other | Source: Ambulatory Visit | Attending: Cardiology | Admitting: Cardiology

## 2017-06-23 ENCOUNTER — Ambulatory Visit (HOSPITAL_COMMUNITY)
Admission: RE | Admit: 2017-06-23 | Discharge: 2017-06-23 | Disposition: A | Discharge status: Home / Self Care | Payer: Medicare Other | Source: Ambulatory Visit | Attending: Cardiovascular Disease | Admitting: Cardiovascular Disease

## 2017-06-23 DIAGNOSIS — J984 Other disorders of lung: Secondary | ICD-10-CM | POA: Insufficient documentation

## 2017-06-23 DIAGNOSIS — I878 Other specified disorders of veins: Secondary | ICD-10-CM

## 2017-06-23 DIAGNOSIS — I517 Cardiomegaly: Secondary | ICD-10-CM | POA: Insufficient documentation

## 2017-06-23 DIAGNOSIS — J90 Pleural effusion, not elsewhere classified: Secondary | ICD-10-CM | POA: Insufficient documentation

## 2017-06-23 DIAGNOSIS — Z9889 Other specified postprocedural states: Secondary | ICD-10-CM | POA: Insufficient documentation

## 2017-06-23 DIAGNOSIS — I700 Atherosclerosis of aorta: Secondary | ICD-10-CM | POA: Insufficient documentation

## 2017-06-23 DIAGNOSIS — I480 Paroxysmal atrial fibrillation: Secondary | ICD-10-CM | POA: Insufficient documentation

## 2017-06-23 DIAGNOSIS — I4891 Unspecified atrial fibrillation: Secondary | ICD-10-CM | POA: Diagnosis not present

## 2017-06-23 DIAGNOSIS — I872 Venous insufficiency (chronic) (peripheral): Secondary | ICD-10-CM | POA: Diagnosis not present

## 2017-06-23 HISTORY — PX: IR RADIOLOGY PERIPHERAL GUIDED IV START: IMG5598

## 2017-06-23 HISTORY — PX: IR US GUIDE VASC ACCESS LEFT: IMG2389

## 2017-06-23 MED ORDER — IOPAMIDOL (ISOVUE-370) INJECTION 76%
INTRAVENOUS | Status: AC
  Administered 2017-06-23: 80 mL
  Filled 2017-06-23: qty 100

## 2017-06-23 MED ORDER — LIDOCAINE HCL (PF) 1 % IJ SOLN
INTRAMUSCULAR | Status: AC
  Filled 2017-06-23: qty 5

## 2017-06-23 MED ORDER — LIDOCAINE HCL 1 % IJ SOLN
INTRAMUSCULAR | Status: DC | PRN
  Administered 2017-06-23: 5 mL

## 2017-06-23 NOTE — Progress Notes (Signed)
Pt here for CT  Heart test pre- ablation on 7/13. IV team unable to place IV for test. IR MD will place line in IR prior to test.

## 2017-06-27 ENCOUNTER — Telehealth: Payer: Self-pay | Admitting: Cardiology

## 2017-06-27 DIAGNOSIS — I4891 Unspecified atrial fibrillation: Secondary | ICD-10-CM | POA: Diagnosis not present

## 2017-06-27 DIAGNOSIS — Z853 Personal history of malignant neoplasm of breast: Secondary | ICD-10-CM | POA: Diagnosis not present

## 2017-06-27 DIAGNOSIS — E039 Hypothyroidism, unspecified: Secondary | ICD-10-CM | POA: Diagnosis not present

## 2017-06-27 NOTE — Telephone Encounter (Signed)
Pt would like her CT results please by end of  Today please.

## 2017-06-27 NOTE — Telephone Encounter (Signed)
Results reviewed with patient. Informed her that result sent via MyChart yesterday.  She is appreciative of my return call.

## 2017-06-27 NOTE — Telephone Encounter (Signed)
Pt would like a call back with the results of her CT  7/6 Before Thursday  Please.

## 2017-06-29 NOTE — Anesthesia Preprocedure Evaluation (Addendum)
Anesthesia Evaluation  Patient identified by MRN, date of birth, ID band Patient awake    Reviewed: Allergy & Precautions, H&P , NPO status , Patient's Chart, lab work & pertinent test results  Airway Mallampati: II  TM Distance: >3 FB Neck ROM: Full    Dental no notable dental hx. (+) Teeth Intact, Dental Advisory Given   Pulmonary shortness of breath, pneumonia,    Pulmonary exam normal breath sounds clear to auscultation       Cardiovascular hypertension, Pt. on medications +CHF  + dysrhythmias Atrial Fibrillation  Rhythm:Regular Rate:Normal  Cardiomyopathy   Neuro/Psych negative neurological ROS  negative psych ROS   GI/Hepatic negative GI ROS, Neg liver ROS,   Endo/Other  Hypothyroidism   Renal/GU negative Renal ROS  negative genitourinary   Musculoskeletal negative musculoskeletal ROS (+)   Abdominal   Peds  Hematology negative hematology ROS (+) Hodgkins disease   Anesthesia Other Findings ECHO  5/18  - Normal LVF with EF 60-65% with moderate MR, mild pulmonary htn   with PASP 60mmHg. Compared to prior echo, MR remains moderate and   PASP has increased form 33 to 108mmHg. The right ventricular   systolic pressure was increased consistent with mild pulmonary   Hypertension.  EKG 5/21   Normal sinus rhythm Possible Left atrial enlargement Right superior axis deviation Non-specific intra-ventricular conduction block  Reproductive/Obstetrics negative OB ROS CA breast                             Anesthesia Physical  Anesthesia Plan  ASA: III  Anesthesia Plan: General   Post-op Pain Management:    Induction: Intravenous  PONV Risk Score and Plan: 2 and Ondansetron, Dexamethasone and Treatment may vary due to age or medical condition  Airway Management Planned: Oral ETT  Additional Equipment:   Intra-op Plan:   Post-operative Plan:   Informed Consent: I have  reviewed the patients History and Physical, chart, labs and discussed the procedure including the risks, benefits and alternatives for the proposed anesthesia with the patient or authorized representative who has indicated his/her understanding and acceptance.   Dental advisory given  Plan Discussed with: CRNA  Anesthesia Plan Comments:        Anesthesia Quick Evaluation

## 2017-06-30 ENCOUNTER — Ambulatory Visit (HOSPITAL_COMMUNITY): Payer: Medicare Other | Admitting: Anesthesiology

## 2017-06-30 ENCOUNTER — Ambulatory Visit (HOSPITAL_COMMUNITY)
Admission: RE | Admit: 2017-06-30 | Discharge: 2017-07-01 | Disposition: A | Discharge status: Home / Self Care | Payer: Medicare Other | Source: Ambulatory Visit | Attending: Cardiology | Admitting: Cardiology

## 2017-06-30 ENCOUNTER — Encounter (HOSPITAL_COMMUNITY)
Admission: RE | Disposition: A | Discharge status: Home / Self Care | Payer: Self-pay | Source: Ambulatory Visit | Attending: Cardiology

## 2017-06-30 ENCOUNTER — Encounter (HOSPITAL_COMMUNITY): Payer: Self-pay | Admitting: Certified Registered Nurse Anesthetist

## 2017-06-30 DIAGNOSIS — I5032 Chronic diastolic (congestive) heart failure: Secondary | ICD-10-CM | POA: Diagnosis present

## 2017-06-30 DIAGNOSIS — I10 Essential (primary) hypertension: Secondary | ICD-10-CM | POA: Diagnosis not present

## 2017-06-30 DIAGNOSIS — I700 Atherosclerosis of aorta: Secondary | ICD-10-CM | POA: Insufficient documentation

## 2017-06-30 DIAGNOSIS — I4891 Unspecified atrial fibrillation: Secondary | ICD-10-CM | POA: Diagnosis not present

## 2017-06-30 DIAGNOSIS — I480 Paroxysmal atrial fibrillation: Secondary | ICD-10-CM

## 2017-06-30 DIAGNOSIS — E890 Postprocedural hypothyroidism: Secondary | ICD-10-CM | POA: Diagnosis not present

## 2017-06-30 DIAGNOSIS — I110 Hypertensive heart disease with heart failure: Secondary | ICD-10-CM | POA: Insufficient documentation

## 2017-06-30 DIAGNOSIS — I6523 Occlusion and stenosis of bilateral carotid arteries: Secondary | ICD-10-CM | POA: Insufficient documentation

## 2017-06-30 DIAGNOSIS — Z923 Personal history of irradiation: Secondary | ICD-10-CM | POA: Insufficient documentation

## 2017-06-30 DIAGNOSIS — I447 Left bundle-branch block, unspecified: Secondary | ICD-10-CM | POA: Insufficient documentation

## 2017-06-30 DIAGNOSIS — I428 Other cardiomyopathies: Secondary | ICD-10-CM | POA: Diagnosis not present

## 2017-06-30 DIAGNOSIS — J90 Pleural effusion, not elsewhere classified: Secondary | ICD-10-CM | POA: Diagnosis not present

## 2017-06-30 DIAGNOSIS — Z7901 Long term (current) use of anticoagulants: Secondary | ICD-10-CM | POA: Insufficient documentation

## 2017-06-30 HISTORY — DX: Paroxysmal atrial fibrillation: I48.0

## 2017-06-30 HISTORY — PX: ATRIAL FIBRILLATION ABLATION: EP1191

## 2017-06-30 LAB — POCT ACTIVATED CLOTTING TIME
ACTIVATED CLOTTING TIME: 335 s
Activated Clotting Time: 378 seconds

## 2017-06-30 SURGERY — ATRIAL FIBRILLATION ABLATION
Anesthesia: Monitor Anesthesia Care

## 2017-06-30 MED ORDER — MAGNESIUM OXIDE 400 (241.3 MG) MG PO TABS
400.0000 mg | ORAL_TABLET | Freq: Two times a day (BID) | ORAL | Status: DC
  Administered 2017-06-30 – 2017-07-01 (×3): 400 mg via ORAL
  Filled 2017-06-30 (×3): qty 1

## 2017-06-30 MED ORDER — HEPARIN (PORCINE) IN NACL 2-0.9 UNIT/ML-% IJ SOLN
INTRAMUSCULAR | Status: AC
  Filled 2017-06-30: qty 500

## 2017-06-30 MED ORDER — SODIUM CHLORIDE 0.9% FLUSH
3.0000 mL | Freq: Two times a day (BID) | INTRAVENOUS | Status: DC
  Administered 2017-06-30 – 2017-07-01 (×2): 3 mL via INTRAVENOUS

## 2017-06-30 MED ORDER — HEPARIN SODIUM (PORCINE) 1000 UNIT/ML IJ SOLN
INTRAMUSCULAR | Status: DC | PRN
  Administered 2017-06-30: 1000 [IU] via INTRAVENOUS

## 2017-06-30 MED ORDER — ONDANSETRON HCL 4 MG/2ML IJ SOLN: 4.0000 mg | Freq: Four times a day (QID) | INTRAMUSCULAR | Status: DC | PRN

## 2017-06-30 MED ORDER — ADULT MULTIVITAMIN W/MINERALS CH
1.0000 | ORAL_TABLET | Freq: Every day | ORAL | Status: DC
  Administered 2017-07-01: 1 via ORAL
  Filled 2017-06-30: qty 1

## 2017-06-30 MED ORDER — HEPARIN (PORCINE) IN NACL 2-0.9 UNIT/ML-% IJ SOLN
INTRAMUSCULAR | Status: AC | PRN
  Administered 2017-06-30: 2000 mL

## 2017-06-30 MED ORDER — ONE-DAILY MULTI VITAMINS PO TABS: 1.0000 | ORAL_TABLET | Freq: Every day | ORAL | Status: DC

## 2017-06-30 MED ORDER — ACETAMINOPHEN 325 MG PO TABS: 650.0000 mg | ORAL_TABLET | Freq: Every evening | ORAL | Status: DC | PRN

## 2017-06-30 MED ORDER — HEPARIN SODIUM (PORCINE) 1000 UNIT/ML IJ SOLN
INTRAMUSCULAR | Status: DC | PRN
  Administered 2017-06-30: 13000 [IU] via INTRAVENOUS

## 2017-06-30 MED ORDER — LOSARTAN POTASSIUM 50 MG PO TABS
50.0000 mg | ORAL_TABLET | Freq: Two times a day (BID) | ORAL | Status: DC
  Administered 2017-06-30 – 2017-07-01 (×3): 50 mg via ORAL
  Filled 2017-06-30 (×3): qty 1

## 2017-06-30 MED ORDER — ONDANSETRON HCL 4 MG/2ML IJ SOLN
INTRAMUSCULAR | Status: DC | PRN
  Administered 2017-06-30: 4 mg via INTRAVENOUS

## 2017-06-30 MED ORDER — MAGNESIUM OXIDE 400 MG PO TABS: 400.0000 mg | ORAL_TABLET | Freq: Two times a day (BID) | ORAL | Status: DC

## 2017-06-30 MED ORDER — CALCIUM CARBONATE 1250 (500 CA) MG PO TABS
1.0000 | ORAL_TABLET | Freq: Every day | ORAL | Status: DC
  Administered 2017-06-30: 500 mg via ORAL
  Filled 2017-06-30: qty 1

## 2017-06-30 MED ORDER — CALCIUM CARBONATE 600 MG PO TABS: 600.0000 mg | ORAL_TABLET | Freq: Every day | ORAL | Status: DC

## 2017-06-30 MED ORDER — ROCURONIUM BROMIDE 10 MG/ML (PF) SYRINGE
PREFILLED_SYRINGE | INTRAVENOUS | Status: DC | PRN
  Administered 2017-06-30: 50 mg via INTRAVENOUS

## 2017-06-30 MED ORDER — SUGAMMADEX SODIUM 200 MG/2ML IV SOLN
INTRAVENOUS | Status: DC | PRN
  Administered 2017-06-30: 100 mg via INTRAVENOUS

## 2017-06-30 MED ORDER — B COMPLEX-C PO TABS
1.0000 | ORAL_TABLET | Freq: Every day | ORAL | Status: DC
  Administered 2017-07-01: 1 via ORAL
  Filled 2017-06-30 (×2): qty 1

## 2017-06-30 MED ORDER — DOBUTAMINE IN D5W 4-5 MG/ML-% IV SOLN
INTRAVENOUS | Status: DC | PRN
Start: 2017-06-30 — End: 2017-06-30
  Administered 2017-06-30: 20 ug/kg/min via INTRAVENOUS

## 2017-06-30 MED ORDER — VITAMIN D 50 MCG (2000 UT) PO CAPS: 2000.0000 [IU] | ORAL_CAPSULE | Freq: Every day | ORAL | Status: DC

## 2017-06-30 MED ORDER — BUPIVACAINE HCL (PF) 0.25 % IJ SOLN
INTRAMUSCULAR | Status: DC | PRN
  Administered 2017-06-30: 60 mL

## 2017-06-30 MED ORDER — SODIUM CHLORIDE 0.9% FLUSH: 3.0000 mL | INTRAVENOUS | Status: DC | PRN

## 2017-06-30 MED ORDER — LEVOTHYROXINE SODIUM 100 MCG PO TABS
100.0000 ug | ORAL_TABLET | Freq: Every day | ORAL | Status: DC
  Administered 2017-07-01: 100 ug via ORAL
  Filled 2017-06-30: qty 1

## 2017-06-30 MED ORDER — RIVAROXABAN 20 MG PO TABS: 20.0000 mg | ORAL_TABLET | Freq: Every day | ORAL | Status: DC

## 2017-06-30 MED ORDER — LACTATED RINGERS IV SOLN
INTRAVENOUS | Status: DC | PRN
  Administered 2017-06-30: 08:00:00 via INTRAVENOUS

## 2017-06-30 MED ORDER — PROTAMINE SULFATE 10 MG/ML IV SOLN
INTRAVENOUS | Status: DC | PRN
Start: 2017-06-30 — End: 2017-06-30
  Administered 2017-06-30: 40 mg via INTRAVENOUS

## 2017-06-30 MED ORDER — ATORVASTATIN CALCIUM 10 MG PO TABS
10.0000 mg | ORAL_TABLET | Freq: Every day | ORAL | Status: DC
  Administered 2017-06-30: 10 mg via ORAL
  Filled 2017-06-30: qty 1

## 2017-06-30 MED ORDER — DOBUTAMINE IN D5W 4-5 MG/ML-% IV SOLN
INTRAVENOUS | Status: AC
  Filled 2017-06-30: qty 250

## 2017-06-30 MED ORDER — BUPIVACAINE HCL (PF) 0.25 % IJ SOLN
INTRAMUSCULAR | Status: AC
  Filled 2017-06-30: qty 30

## 2017-06-30 MED ORDER — PHENYLEPHRINE 40 MCG/ML (10ML) SYRINGE FOR IV PUSH (FOR BLOOD PRESSURE SUPPORT)
PREFILLED_SYRINGE | INTRAVENOUS | Status: DC | PRN
  Administered 2017-06-30 (×3): 80 ug via INTRAVENOUS

## 2017-06-30 MED ORDER — VITAMIN D 1000 UNITS PO TABS: 2000.0000 [IU] | ORAL_TABLET | Freq: Every day | ORAL | Status: DC

## 2017-06-30 MED ORDER — DOFETILIDE 250 MCG PO CAPS
250.0000 ug | ORAL_CAPSULE | Freq: Two times a day (BID) | ORAL | Status: DC
  Administered 2017-06-30 – 2017-07-01 (×2): 250 ug via ORAL
  Filled 2017-06-30 (×2): qty 1

## 2017-06-30 MED ORDER — RIVAROXABAN 20 MG PO TABS
20.0000 mg | ORAL_TABLET | Freq: Every day | ORAL | Status: DC
  Administered 2017-06-30: 20 mg via ORAL
  Filled 2017-06-30: qty 1

## 2017-06-30 MED ORDER — CARVEDILOL 12.5 MG PO TABS
12.5000 mg | ORAL_TABLET | Freq: Two times a day (BID) | ORAL | Status: DC
  Administered 2017-06-30 – 2017-07-01 (×2): 12.5 mg via ORAL
  Filled 2017-06-30 (×2): qty 1

## 2017-06-30 MED ORDER — LIDOCAINE 2% (20 MG/ML) 5 ML SYRINGE
INTRAMUSCULAR | Status: DC | PRN
  Administered 2017-06-30: 50 mg via INTRAVENOUS

## 2017-06-30 MED ORDER — PROPOFOL 10 MG/ML IV BOLUS
INTRAVENOUS | Status: DC | PRN
  Administered 2017-06-30: 150 mg via INTRAVENOUS

## 2017-06-30 MED ORDER — EPHEDRINE SULFATE-NACL 50-0.9 MG/10ML-% IV SOSY
PREFILLED_SYRINGE | INTRAVENOUS | Status: DC | PRN
  Administered 2017-06-30 (×3): 5 mg via INTRAVENOUS

## 2017-06-30 MED ORDER — B COMPLEX VITAMINS PO CAPS: 1.0000 | ORAL_CAPSULE | Freq: Every day | ORAL | Status: DC

## 2017-06-30 MED ORDER — SODIUM CHLORIDE 0.9 % IV SOLN: 250.0000 mL | INTRAVENOUS | Status: DC | PRN

## 2017-06-30 MED ORDER — HEPARIN SODIUM (PORCINE) 1000 UNIT/ML IJ SOLN
INTRAMUSCULAR | Status: AC
  Filled 2017-06-30: qty 1

## 2017-06-30 MED ORDER — FENTANYL CITRATE (PF) 250 MCG/5ML IJ SOLN
INTRAMUSCULAR | Status: DC | PRN
  Administered 2017-06-30: 50 ug via INTRAVENOUS

## 2017-06-30 SURGICAL SUPPLY — 18 items
BAG SNAP BAND KOVER 36X36 (MISCELLANEOUS) ×3 IMPLANT
BLANKET WARM UNDERBOD FULL ACC (MISCELLANEOUS) ×3 IMPLANT
CATH SMTCH THERMOCOOL SF DF (CATHETERS) ×3 IMPLANT
CATH SOUNDSTAR 3D IMAGING (CATHETERS) ×3 IMPLANT
CATH VARIABLE LASSO NAV 2515 (CATHETERS) ×3 IMPLANT
CATH WEBSTER BI DIR CS D-F CRV (CATHETERS) ×3 IMPLANT
COVER SWIFTLINK CONNECTOR (BAG) ×3 IMPLANT
NEEDLE TRANSSEPTAL BRK 98CM (NEEDLE) ×3 IMPLANT
PACK EP LATEX FREE (CUSTOM PROCEDURE TRAY) ×2
PACK EP LF (CUSTOM PROCEDURE TRAY) ×1 IMPLANT
PAD DEFIB LIFELINK (PAD) ×3 IMPLANT
PATCH CARTO3 (PAD) ×3 IMPLANT
SHEATH AGILIS NXT 8.5F 71CM (SHEATH) ×6 IMPLANT
SHEATH AVANTI 11F 11CM (SHEATH) ×3 IMPLANT
SHEATH PINNACLE 7F 10CM (SHEATH) ×3 IMPLANT
SHEATH PINNACLE 8F 10CM (SHEATH) ×6 IMPLANT
SHEATH PINNACLE 9F 10CM (SHEATH) ×6 IMPLANT
TUBING SMART ABLATE COOLFLOW (TUBING) ×3 IMPLANT

## 2017-06-30 NOTE — Anesthesia Postprocedure Evaluation (Signed)
Anesthesia Post Note  Patient: [name]  Procedure(s) Performed: Procedure(s) (LRB): Atrial Fibrillation Ablation (N/A)     Patient location during evaluation: PACU Anesthesia Type: General Level of consciousness: awake and alert Pain management: pain level controlled Vital Signs Assessment: post-procedure vital signs reviewed and stable Respiratory status: spontaneous breathing, nonlabored ventilation, respiratory function stable and patient connected to nasal cannula oxygen Cardiovascular status: blood pressure returned to baseline and stable Postop Assessment: no signs of nausea or vomiting Anesthetic complications: no    Last Vitals:  Vitals:   06/30/17 0550 06/30/17 1043  BP: (!) 160/75   Pulse: 71   Temp: 36.8 C 36.5 C    Last Pain:  Vitals:   06/30/17 1043  TempSrc: Temporal                 Junella Domke

## 2017-06-30 NOTE — Discharge Instructions (Signed)
No driving for 4 days. No lifting over 5 lbs for 1 week. No vigorous or sexual activity for 1 week. You may return to work on 07/07/17. Keep procedure site clean & dry. If you notice increased pain, swelling, bleeding or pus, call/return!  You may shower, but no soaking baths/hot tubs/pools for 1 week.    You have an appointment set up with the [facility].  Multiple studies have shown that being followed by a dedicated atrial fibrillation clinic in addition to the standard care you receive from your other physicians improves health. We believe that enrollment in the atrial fibrillation clinic will allow [place] to better care for you.   The phone number to the [facility] is [phone]. The clinic is staffed Monday through Friday from 8:30am to 5pm.  Parking Directions: The clinic is located in the Heart and Vascular Building connected to [facility]. 1)From [address] turn on to [address] and go to the 3rd entrance  (Heart and Vascular entrance) on the right. 2)Look to the right for Heart &Vascular Parking Garage. 3) The code for the entrance is 7000 for July.   4)Take the elevators to the 1st floor. Registration is in the room with the glass walls at the end of the hallway.  If you have any trouble parking or locating the clinic, please dont hesitate to call [phone].   Information on my medicine - XARELTO (Rivaroxaban)  This medication education was reviewed with me or my healthcare representative as part of my discharge preparation.  The pharmacist that spoke with me during my hospital stay was:  Brain Hilts, [facility]  Why was Xarelto prescribed for you? Xarelto was prescribed for you to reduce the risk of a blood clot forming that can cause a stroke if you have a medical condition called atrial fibrillation (a type of irregular heartbeat).  What do you need to know about xarelto ? Take your Xarelto ONCE DAILY at the same time  every day with your evening meal. If you have difficulty swallowing the tablet whole, you may crush it and mix in applesauce just prior to taking your dose.  Take Xarelto exactly as prescribed by your doctor and DO NOT stop taking Xarelto without talking to the doctor who prescribed the medication.  Stopping without other stroke prevention medication to take the place of Xarelto may increase your risk of developing a clot that causes a stroke.  Refill your prescription before you run out.  After discharge, you should have regular check-up appointments with your healthcare provider that is prescribing your Xarelto.  In the future your dose may need to be changed if your kidney function or weight changes by a significant amount.  What do you do if you miss a dose? If you are taking Xarelto ONCE DAILY and you miss a dose, take it as soon as you remember on the same day then continue your regularly scheduled once daily regimen the next day. Do not take two doses of Xarelto at the same time or on the same day.   Important Safety Information A possible side effect of Xarelto is bleeding. You should call your healthcare provider right away if you experience any of the following: ? Bleeding from an injury or your nose that does not stop. ? Unusual colored urine (red or dark brown) or unusual colored stools (red or black). ? Unusual bruising for unknown reasons. ? A serious fall or if you hit your head (even if  there is no bleeding).  Some medicines may interact with Xarelto and might increase your risk of bleeding while on Xarelto. To help avoid this, consult your healthcare provider or pharmacist prior to using any new prescription or non-prescription medications, including herbals, vitamins, non-steroidal anti-inflammatory drugs (NSAIDs) and supplements.  This website has more information on Xarelto: [email or web address].[email or web address]

## 2017-06-30 NOTE — Anesthesia Procedure Notes (Signed)
Procedure Name: Intubation Date/Time: 06/30/2017 7:58 AM Performed by: Julieta Bellini Pre-anesthesia Checklist: Patient identified, Emergency Drugs available, Suction available and Patient being monitored Patient Re-evaluated:Patient Re-evaluated prior to induction Oxygen Delivery Method: Circle system utilized Preoxygenation: Pre-oxygenation with 100% oxygen Induction Type: IV induction Ventilation: Mask ventilation without difficulty Laryngoscope Size: Mac and 3 Grade View: Grade I Tube type: Oral Tube size: 7.0 mm Number of attempts: 1 Airway Equipment and Method: Stylet Placement Confirmation: ETT inserted through vocal cords under direct vision,  positive ETCO2 and breath sounds checked- equal and bilateral Secured at: 22 cm Tube secured with: Tape Dental Injury: Teeth and Oropharynx as per pre-operative assessment

## 2017-06-30 NOTE — Addendum Note (Signed)
Addendum  created 06/30/17 1305 by Janeece Riggers, MD   Sign clinical note

## 2017-06-30 NOTE — Plan of Care (Signed)
Problem: Activity: Goal: Ability to tolerate increased activity will improve Outcome: Progressing Strict Bedrest until 1805

## 2017-06-30 NOTE — H&P (Signed)
[name] is a 70 y.o. female with a history of paroxysmal atrial fibrillation. She has fatigue with her AF which usually occurs in the afternoon. On exam, RRR, no murmurs, lungs clear. Risks and benefits discussed. Risks include but not limited to bleeding, tamponade, heart block, stroke, and damage to surrounding organs. She understands the risks and has agreed to the procedure.  Totiana Everson Curt Bears, MD 06/30/2017 7:10 AM

## 2017-06-30 NOTE — Transfer of Care (Signed)
Immediate Anesthesia Transfer of Care Note  Patient: [name]  Procedure(s) Performed: Procedure(s): Atrial Fibrillation Ablation (N/A)  Patient Location: Cath Lab  Anesthesia Type:General  Level of Consciousness: awake, alert , oriented and patient cooperative  Airway & Oxygen Therapy: Patient Spontanous Breathing and Patient connected to nasal cannula oxygen  Post-op Assessment: Report given to RN, Post -op Vital signs reviewed and stable and Patient moving all extremities X 4  Post vital signs: Reviewed and stable  Last Vitals:  Vitals:   06/30/17 0550 06/30/17 1043  BP: (!) 160/75   Pulse: 71   Temp: 36.8 C 36.5 C    Last Pain:  Vitals:   06/30/17 1043  TempSrc: Temporal         Complications: No apparent anesthesia complications

## 2017-06-30 NOTE — Progress Notes (Addendum)
Site area: 2 rt fv sheaths Site Prior to Removal:  Level 0 Pressure Applied For: 25 minutes Manual:   yes Patient Status During Pull:  stable Post Pull Site:  Level 0 Post Pull Instructions Given:  yes Post Pull Pulses Present: palpable Dressing Applied:  Gauze and tegaderm Bedrest begins @  Comments:

## 2017-06-30 NOTE — Progress Notes (Addendum)
Site area: left groin 2 fv sheaths Site Prior to Removal:  Level 0 Pressure Applied For: 25 minutes Manual:   yes Patient Status During Pull:  stable Post Pull Site:  Level 0 Post Pull Instructions Given:  yes Post Pull Pulses Present: palpable Dressing Applied:  Gauze and tegaderm Bedrest begins @ [id number] Comments: IV saline locked

## 2017-07-01 ENCOUNTER — Encounter (HOSPITAL_COMMUNITY): Payer: Self-pay | Admitting: Physician Assistant

## 2017-07-01 DIAGNOSIS — I110 Hypertensive heart disease with heart failure: Secondary | ICD-10-CM | POA: Diagnosis not present

## 2017-07-01 DIAGNOSIS — Z7901 Long term (current) use of anticoagulants: Secondary | ICD-10-CM | POA: Diagnosis not present

## 2017-07-01 DIAGNOSIS — E890 Postprocedural hypothyroidism: Secondary | ICD-10-CM | POA: Diagnosis not present

## 2017-07-01 DIAGNOSIS — I447 Left bundle-branch block, unspecified: Secondary | ICD-10-CM | POA: Diagnosis not present

## 2017-07-01 DIAGNOSIS — I700 Atherosclerosis of aorta: Secondary | ICD-10-CM | POA: Diagnosis not present

## 2017-07-01 DIAGNOSIS — I480 Paroxysmal atrial fibrillation: Secondary | ICD-10-CM | POA: Diagnosis not present

## 2017-07-01 DIAGNOSIS — I428 Other cardiomyopathies: Secondary | ICD-10-CM | POA: Diagnosis not present

## 2017-07-01 DIAGNOSIS — Z923 Personal history of irradiation: Secondary | ICD-10-CM | POA: Diagnosis not present

## 2017-07-01 DIAGNOSIS — I6523 Occlusion and stenosis of bilateral carotid arteries: Secondary | ICD-10-CM | POA: Diagnosis not present

## 2017-07-01 DIAGNOSIS — I5032 Chronic diastolic (congestive) heart failure: Secondary | ICD-10-CM | POA: Diagnosis not present

## 2017-07-01 LAB — CBC
HEMATOCRIT: 39.3 % (ref 36.0–46.0)
HEMOGLOBIN: 12.9 g/dL (ref 12.0–15.0)
MCH: 30.6 pg (ref 26.0–34.0)
MCHC: 32.8 g/dL (ref 30.0–36.0)
MCV: 93.1 fL (ref 78.0–100.0)
Platelets: 259 10*3/uL (ref 150–400)
RBC: 4.22 MIL/uL (ref 3.87–5.11)
RDW: 14.5 % (ref 11.5–15.5)
WBC: 11.3 10*3/uL — AB (ref 4.0–10.5)

## 2017-07-01 NOTE — Progress Notes (Signed)
Progress Note  Patient Name: [name] Date of Encounter: 07/01/2017  Primary Cardiologist:   Primary Electrophysiologist: [facility]   Patient Profile     71 y.o. female with NICM and hx of cancer EF 45 % 2013  EF 55 % 5/18 w mod MR  Subjective   without chest pain or sob Bleeding in groin yesterday with pressure held   Inpatient Medications    Scheduled Meds: . atorvastatin  10 mg Oral q1800  . B-complex with vitamin C  1 tablet Oral Daily  . calcium carbonate  1 tablet Oral QHS  . carvedilol  12.5 mg Oral BID WC  . cholecalciferol  2,000 Units Oral Q1200  . dofetilide  250 mcg Oral BID  . levothyroxine  100 mcg Oral QAC breakfast  . losartan  50 mg Oral BID  . magnesium oxide  400 mg Oral BID  . multivitamin with minerals  1 tablet Oral Daily  . rivaroxaban  20 mg Oral Q supper  . sodium chloride flush  3 mL Intravenous Q12H   Continuous Infusions: . sodium chloride     PRN Meds: sodium chloride, acetaminophen, ondansetron (ZOFRAN) IV, sodium chloride flush   Vital Signs    Vitals:   06/30/17 2111 07/01/17 0015 07/01/17 0402 07/01/17 0726  BP: 118/64 120/62 (!) 110/50 (!) 117/51  Pulse: 88 80 84 78  Resp: 15 14 18 16   Temp: 98.6 F (37 C) 98.4 F (36.9 C) 98.8 F (37.1 C) 98.2 F (36.8 C)  TempSrc: Oral Oral Oral Oral  SpO2: 94% 93% 96% 95%  Weight:   136 lb 6.4 oz (61.9 kg)   Height:        Intake/Output Summary (Last 24 hours) at 07/01/17 0750 Last data filed at 07/01/17 0406  Gross per 24 hour  Intake              840 ml  Output             1105 ml  Net             -265 ml   Filed Weights   06/30/17 0550 06/30/17 1216 07/01/17 0402  Weight: 134 lb (60.8 kg) 151 lb 3.2 oz (68.6 kg) 136 lb 6.4 oz (61.9 kg)    Telemetry    Sinus with runs of PACs and nonsustained atrial tach - Personally Reviewed  ECG   sinus with IVCD - Personally Reviewed  Unchanged  5/18  Physical Exam    GEN: No acute distress.   Neck: JVD  Cardiac: RRR, no   murmurs, rubs, or gallops.  Respiratory: Clear to auscultation bilaterally. GI: Soft, nontender, non-distended  MS:   edema; No deformity. Swelling and fullness of right thigh without bruit  Neuro:  Nonfocal  Psych: Normal affect  Skin Warm and dry   Labs    ChemistryNo results for input(s): NA, K, CL, CO2, GLUCOSE, BUN, CREATININE, CALCIUM, PROT, ALBUMIN, AST, ALT, ALKPHOS, BILITOT, GFRNONAA, GFRAA, ANIONGAP in the last 168 hours.   HematologyNo results for input(s): WBC, RBC, HGB, HCT, MCV, MCH, MCHC, RDW, PLT in the last 168 hours.  Cardiac EnzymesNo results for input(s): TROPONINI in the last 168 hours. No results for input(s): TROPIPOC in the last 168 hours.   BNPNo results for input(s): BNP, PROBNP in the last 168 hours.   DDimer No results for input(s): DDIMER in the last 168 hours.       Assessment & Plan    Atrial fib  With PVI Groin bleeding  Cardiomyopathy Hyeprtension   Will cehck CBC Anticipate discharge this afternoon if groin ok   Will make decision re holding Rivaroxaban for a day as was in sinus at procedure   Signed, Virl Axe, MD  07/01/2017, 7:50 AM

## 2017-07-01 NOTE — Discharge Summary (Signed)
Discharge Summary    Patient ID: [name]  MRN: [id number], DOB/AGE: 72-16-1947 72 y.o.  Admit Date: 06/30/2017 Discharge Date: 07/01/2017  Primary Care Provider: [name], MD Primary Cardiologist: Dr. Johnsie Cancel, MD Primary Electrophysiologist: Dr. Curt Bears, MD  Discharge Diagnoses    Principal Problem:   PAF (paroxysmal atrial fibrillation) [facility]) Active Problems:   LBBB (left bundle branch block)   Chronic diastolic CHF (congestive heart failure) (HCC)   Allergies Allergies  Allergen Reactions  . Ace Inhibitors     Cough  . Benazepril     cough  . Epinephrine     REACTION: BUZZ; INCREASED HEART RATE  . Potassium-Containing Compounds     Projectile vomiting--liquid potassium   . Prednisolone Other (See Comments)    ARRYTHMIAS  . Taxotere [Docetaxel] Other (See Comments)    Fluid overload     History of Present Illness     71 year old female with history of NICM with prior EF of 45% improved EF to 60% by echo in 02/2439, chronic diastolic CHF, PAF previously on flecainide started in 2013 changed to Tikosyn on Xarelto, prior PEA arrest in the setting of hypertensive crisis with pulmonary edema, LBBB, HTN, hypothyroidism, Hodgkin's disease diagnosed in 1998 s/p radiation, breast cancer s/p mastectomy in 2009 with recurrent breast cancer in 2013, and carotid stenosis with carotid ultrasound in 11/2016 showed RICA 10-27% stenosis and LICA 25-36% stenosis who resented to [facility] for planned Afib ablation.   Prior ETT negative for arrhythmia. Had breakthrough PAF in 04/2017 documented by monitor. Myoview on 05/05/17 without ischemia, EF 58%. Echo on 05/05/17 with EF 55-60%, moderate MR and LAE. Seen by Dr. Curt Bears on 06/20/17 for evaluation of Afib ablation.   Hospital Course     Consultants: none   She presented to [facility] on 06/30/17 for planned Afib ablation. She underwent comprehensive EP study with coronary sinus pacing and recording. Three-dimensional  mapping of Afib with additional mapping and ablation of second discrete focus, ablation of atrial fibrillation with additional mapping and ablation of a second discrete focus, intracardiac echocardiography, transseptal puncture of an intact septum, arrhythmia induction with pacing with dobutamine infusion. She had successful restoration of sinus rhythm at the end of the case without any inducible arrhythmias following ablation on and off Isuprel. There were no post-procedure complications.   Post-procedure CBC was stable. Recent potassium and magnesium at goal.   The patient's right femoral cath site has been examined is healing well without issues at this time. The patient has been seen by Dr. Caryl Comes and felt to be stable for discharge today. All follow up appointments have been made. Discharge medications are listed below. Prescriptions have been reviewed with the patient and sent in to their pharmacy.  _____________  Discharge Vitals Blood pressure (!) 117/51, pulse 78, temperature 98.2 F (36.8 C), temperature source Oral, resp. rate 16, height 5\' 2"  (1.575 m), weight 136 lb 6.4 oz (61.9 kg), last menstrual period 03/08/1995, SpO2 95 %.  Filed Weights   06/30/17 0550 06/30/17 1216 07/01/17 0402  Weight: 134 lb (60.8 kg) 151 lb 3.2 oz (68.6 kg) 136 lb 6.4 oz (61.9 kg)    Labs & Radiologic Studies    CBC No results for input(s): WBC, NEUTROABS, HGB, HCT, MCV, PLT in the last 72 hours. Basic Metabolic Panel No results for input(s): NA, K, CL, CO2, GLUCOSE, BUN, CREATININE, CALCIUM, MG, PHOS in the last 72 hours. Liver Function Tests No results for input(s): AST, ALT,  ALKPHOS, BILITOT, PROT, ALBUMIN in the last 72 hours. No results for input(s): LIPASE, AMYLASE in the last 72 hours. Cardiac Enzymes No results for input(s): CKTOTAL, CKMB, CKMBINDEX, TROPONINI in the last 72 hours. BNP Invalid input(s): POCBNP D-Dimer No results for input(s): DDIMER in the last 72 hours. Hemoglobin  A1C No results for input(s): HGBA1C in the last 72 hours. Fasting Lipid Panel No results for input(s): CHOL, HDL, LDLCALC, TRIG, CHOLHDL, LDLDIRECT in the last 72 hours. Thyroid Function Tests No results for input(s): TSH, T4TOTAL, T3FREE, THYROIDAB in the last 72 hours.  Invalid input(s): FREET3 _____________  Ir US Guide Vasc Access Left  Result Date: 06/23/2017 IMPRESSION: 1. Technically successful left brachial vein peripheral IV placement under ultrasound. Okay for routine use. Electronically Signed   By: Lucrezia Europe M.D.   On: 06/23/2017 11:25   Ct Cardiac Morph/pulm Vein W/cm&w/o Ca Score  Addendum Date: 06/26/2017   IMPRESSION: 1) Normal PV;s with no anomaly 2) No LAA thrombus 3) Moderate LAE and mild RA enlargement 4) No ASD/PFO 5) No pericardial effusion 6) Esophagus courses closest to RLPV ostium Jenkins Rouge Electronically Signed   By: Jenkins Rouge M.D.   On: 06/26/2017 07:39   Result Date: 06/26/2017 EXAM: OVER-READ INTERPRETATION  CT CHEST IMPRESSION: Small left pleural effusion. Cardiomegaly. Aortic atherosclerosis. Paramediastinal postradiation changes.  Basilar scarring. Electronically Signed: By: Rolm Baptise M.D. On: 06/23/2017 11:58   Ir Radiology Peripheral Guided Iv Start  Result Date: 06/23/2017 IMPRESSION: 1. Technically successful left brachial vein peripheral IV placement under ultrasound. Okay for routine use. Electronically Signed   By: Lucrezia Europe M.D.   On: 06/23/2017 11:25    Diagnostic Studies/Procedures   Atrial fibrillation ablation 06/30/2017: PROCEDURES: 1. Comprehensive electrophysiologic study. 2. Coronary sinus pacing and recording. 3. Three-dimensional mapping of atrial fibrillation with additional mapping and ablation of a second discrete focus 4. Ablation of atrial fibrillation with additional mapping and ablation of a second discrete focus 5. Intracardiac echocardiography. 6. Transseptal puncture of an intact septum. 7. Arrhythmia induction with  pacing with dobutamine infusion  INTRODUCTION:  KARIGAN CLONINGER is a 71 y.o. female with a history of paroxysmal atrial fibrillation who now presents for EP study and radiofrequency ablation.  The patient reports initially being diagnosed with atrial fibrillation after presenting with symptomatic palpitations and fatgiue. The patient reports increasing frequency and duration of atrial fibrillation since that time.  The patient has failed medical therapy.  The patient therefore presents today for catheter ablation of atrial fibrillation.  DESCRIPTION OF PROCEDURE:  Informed written consent was obtained, and the patient was brought to the electrophysiology lab in a fasting state.  The patient was adequately sedated with intravenous medications as outlined in the anesthesia report.  The patient's left and right groins were prepped and draped in the usual sterile fashion by the EP lab staff.  Using a percutaneous Seldinger technique, two 8-French hemostasis sheaths were placed in the right femoral vein, and one 7 [place] and one 11-French hemostasis sheaths were placed into the left common femoral vein.  Catheter Placement:  A 7-French Biosense Webster Decapolar coronary sinus catheter was introduced through the right common femoral vein and advanced into the coronary sinus for recording and pacing from this location.    Initial Measurements: The patient presented to the electrophysiology lab in sinus rhythm. her  PR interval measured 147 msec with a QRS duration of 155 msec and a QT interval of 401 msec.       Intracardiac  Echocardiography: A 10-French Biosense Webster AcuNav intracardiac echocardiography catheter was introduced through the right common femoral vein and advanced into the right atrium. Intracardiac echocardiography was performed of the left atrium, and a three-dimensional anatomical rendering of the left atrium was performed using CARTO sound technology.  The patient was noted to have  a moderate sized left atrium.  The interatrial septum was prominent but not aneurysmal. All 4 pulmonary veins were visualized and noted to have separate ostia.  The pulmonary veins were moderate in size.  The left atrial appendage was visualized and did not reveal thrombus.   There was no evidence of pulmonary vein stenosis.   Transseptal Puncture: The right common femoral vein sheaths were exchanged for two 8.5 French Agillis transseptal sheath and transseptal access was achieved in a standard fashion using a Brockenbrough needle under fluoroscopy with intracardiac echocardiography confirmation of the transseptal puncture.  Once transseptal access had been achieved, heparin was administered intravenously and intra- arterially in order to maintain an ACT of greater than 350 seconds throughout the procedure.   3D Mapping and Ablation: The His bundle catheter was removed and in its place a 3.5 mm [employer] Thermocool ablation catheter was advanced into the right atrium.  The transseptal sheath was pulled back into the IVC over a guidewire.  The ablation catheter was advanced across the transseptal hole using the wire as a guide.  The transseptal sheath was then re-advanced over the guidewire into the left atrium.  A duodecapolar Biosense Webster circular mapping catheter was introduced through the transseptal sheath and positioned over the mouth of all 4 pulmonary veins.  Three-dimensional electroanatomical mapping was performed using CARTO technology.  This demonstrated electrical activity within all four pulmonary veins at baseline. The patient underwent successful sequential electrical isolation and anatomical encircling of all four pulmonary veins using radiofrequency current with a circular mapping catheter as a guide.  A WACA approach was used.  Entrance and exit block were confirmed.   Cardioversion: The patient was then cardioverted to sinus rhythm with a single synchronized  360-J biphasic shock with cardioversion electrodes in the anterior-posterior thoracic configuration. He maintained sinus rhythm initially but then had recurence of afib with catheter manipulation within the left atrial, requiring repeat cardioversion with 360J biphasic.  He remained in sinus rhythm thereafter.  Measurements Following Ablation: Following ablation, dobutamine was infused up to 20 mcg/kg/min with no inducible atrial fibrillation, atrial tachycardia, atrial flutter, or sustained PACs. In sinus rhythm with RR interval was 779 msec, with PR 129 msec, QRS 143 msec, and Qtc 458 msec.  Following ablation the AH interval measured 57 msec with an HV interval of 44 msec. Ventricular pacing was performed, which revealed midline decremental VA conduction with a VA Wenckebach cycle length of less than 290 msec.  Rapid atrial pacing was performed, which revealed an AV Wenckebach cycle length of 340 msec.  AVNERP 600/290 msec. Electroisolation was then again confirmed in all four pulmonary veins.  Pacing was performed along the ablation line which confirmed entrance and exit block.  The procedure was therefore considered completed.  All catheters were removed, and the sheaths were aspirated and flushed.  The patient was transferred to the recovery area for sheath removal per protocol. EBL<47ml.  A limited bedside transthoracic echocardiogram revealed no pericardial effusion.  There were no early apparent complications.  CONCLUSIONS: 1. Sinus rhythm upon presentation.   2. Successful electrical isolation and anatomical encircling of all four pulmonary veins with radiofrequency current. 3. No  inducible arrhythmias following ablation both on and off of Isuprel 4. No early apparent complications. _____________  Disposition   Pt is being discharged home today in good condition.  Follow-up Plans & Appointments    Follow-up Information    [facility] Follow up on  08/02/2017.   Specialty:  Cardiology Why:  10:30AM Contact information: [address] [id number] mc [address] [place] [address] [phone]       Constance Haw, MD Follow up on 10/09/2017.   Specialty:  Cardiology Why:  10:30AM Contact information: [address] [place]  [address] [phone]        [facility] Follow up.   Why:  Message has been sent to the office for hospital follow up. Patient is to call the office by 7/19 if she has not heard from our office for appointment.  Contact information: [place] [address] [phone]         Discharge Instructions    Call MD for:  difficulty breathing, headache or visual disturbances    Complete by:  As directed    Call MD for:  extreme fatigue    Complete by:  As directed    Call MD for:  hives    Complete by:  As directed    Call MD for:  persistant dizziness or light-headedness    Complete by:  As directed    Call MD for:  persistant nausea and vomiting    Complete by:  As directed    Call MD for:  redness, tenderness, or signs of infection (pain, swelling, redness, odor or green/yellow discharge around incision site)    Complete by:  As directed    Call MD for:  severe uncontrolled pain    Complete by:  As directed    Call MD for:  temperature >100.4    Complete by:  As directed    Diet - low sodium heart healthy    Complete by:  As directed    Increase activity slowly    Complete by:  As directed       Discharge Medications   Current Discharge Medication List    CONTINUE these medications which have NOT CHANGED   Details  acetaminophen (TYLENOL) 325 MG tablet Take 650 mg by mouth at bedtime as needed for moderate pain.    atorvastatin (LIPITOR) 10 MG tablet Take 1 tablet (10 mg total) by mouth daily at 6 PM. Qty: 30 tablet, Refills: 11    b complex vitamins capsule Take 1  capsule by mouth daily after breakfast.    calcium carbonate (OS-CAL) 600 MG TABS Take 600 mg by mouth at bedtime.     carvedilol (COREG) 12.5 MG tablet Take 1 tablet (12.5 mg total) by mouth 2 (two) times daily with a meal. Qty: 60 tablet, Refills: 0    Cholecalciferol (VITAMIN D) 2000 UNITS CAPS Take 2,000 Units by mouth daily with lunch.     dofetilide (TIKOSYN) 250 MCG capsule TAKE 1 CAPSULE(250 MCG) BY MOUTH EVERY 12 HOURS Qty: 180 capsule, Refills: 2    levothyroxine (SYNTHROID, LEVOTHROID) 100 MCG tablet Take 100 mcg by mouth daily at 6 (six) AM.     losartan (COZAAR) 50 MG tablet Take 1 tablet (50 mg total) by mouth 2 (two) times daily. Qty: 180 tablet, Refills: 3   Associated Diagnoses: Persistent atrial fibrillation ([place]); Essential hypertension; Chronic diastolic heart failure ([place])  LUTEIN-ZEAXANTHIN PO Take 1 capsule by mouth daily after breakfast.     magnesium oxide (MAG-OX 400) 400 MG tablet Take 400 mg by mouth 2 (two) times daily.     Multiple Vitamin (MULTIVITAMIN) tablet Take 1 tablet by mouth daily after breakfast. "Natural Vitamin"    !! OVER THE COUNTER MEDICATION Osteodenz Capsules: Take 1 capsule by mouth at bedtime    !! OVER THE COUNTER MEDICATION Vegetarian Omega Green: Take 3 capsules by mouth daily with lunch    rivaroxaban (XARELTO) 20 MG TABS tablet TAKE 1 TABLET BY MOUTH DAILY WITH DINNER Qty: 90 tablet, Refills: 3     !! - Potential duplicate medications found. Please discuss with provider.       Aspirin prescribed at discharge?  No: on Xarelto High Intensity Statin Prescribed? (Lipitor 40-80mg  or Crestor 20-40mg ): Yes Beta Blocker Prescribed? Yes For EF <40%, was ACEI/ARB Prescribed? No: EF > 40% ADP Receptor Inhibitor Prescribed? (i.e. Plavix etc.-Includes Medically Managed Patients): No: Not indicated  For EF <40%, Aldosterone Inhibitor Prescribed? No: EF > 40% Was EF assessed during THIS hospitalization? No: Recent echo 04/2017 Was  Cardiac Rehab II ordered? (Included Medically managed Patients): No: Not indicated.    Outstanding Labs/Studies   none  Duration of Discharge Encounter   Greater than 30 minutes including physician time.  Signed, Rise Mu, PA-C [facility] Pager: [phone] 07/01/2017, 8:34 AM

## 2017-07-01 NOTE — Care Management Note (Signed)
Case Management Note  Patient Details  Name: [name] MRN: [id number] Date of Birth: 1946/12/04  Subjective/Objective:                 OIB w order to DC to home this morning. Chart reviewed, no CM consult, HH or DME orders, or other needs identified at this time.    Action/Plan:  DC to home.  Expected Discharge Date:  07/01/17               Expected Discharge Plan:  Home/Self Care  In-House Referral:     Discharge planning Services  CM Consult  Post Acute Care Choice:    Choice offered to:     DME Arranged:    DME Agency:     HH Arranged:    HH Agency:     Status of Service:  Completed, signed off  If discussed at [employer] of Stay Meetings, dates discussed:    Additional Comments:  Carles Collet, RN 07/01/2017, 8:51 AM

## 2017-07-02 ENCOUNTER — Other Ambulatory Visit: Payer: Self-pay | Admitting: Cardiovascular Disease

## 2017-07-03 ENCOUNTER — Encounter (HOSPITAL_COMMUNITY): Payer: Self-pay | Admitting: Cardiology

## 2017-07-03 LAB — POCT ACTIVATED CLOTTING TIME: ACTIVATED CLOTTING TIME: 147 s

## 2017-07-18 DIAGNOSIS — Z853 Personal history of malignant neoplasm of breast: Secondary | ICD-10-CM | POA: Diagnosis not present

## 2017-07-18 DIAGNOSIS — Z1231 Encounter for screening mammogram for malignant neoplasm of breast: Secondary | ICD-10-CM | POA: Diagnosis not present

## 2017-07-21 NOTE — Progress Notes (Signed)
Cardiology Office Note:    Date:  07/24/2017   ID:  [name], DOB 03-Oct-1946, MRN [id number]  PCP:  [name], MD  Cardiologist:  Dr. Johnsie Cancel Electrophysiologist: Dr. Curt Bears   Referring MD: [name], MD   Chief Complaint  Patient presents with  . Hospitalization Follow-up    Ablation follow up  . Palpitations    History of Present Illness:    [name] is a 71 y.o. female with a past medical history significant for non-ischemic cardiomyopathy with recent improved EF, diastolic heart failure, atrial fibrillation since 2008, HTN, LBBB, hypothyroidism, Hodgkin's disease, breast cancer and carotid stenosis. She also has had radiation therapy for Hodgkin's lymphoma in 1988, mastectomy in 2009 and recurrent breast cancer in 2013.   The patient has an EF of 45% the past which since has improved to 60% by echo in 04/2017.  She was on flecainide since 2013 for recurrent atrial fibrillation. She was admitted for pulmonary edema and may in the setting of hypertensive crisis complicated by PEA arrest. She is currently treated with Tikosyn and carvedilol and Xarelto.   The patient had breakthrough PAF in 04/2017 documented by monitor. Myoview on 05/05/17 showed no ischemia, EF 58%. She was admitted to [facility] on 06/30/17 for planned A. fib ablation and underwent comprehensive EP study with coronary sinus pacing and recording. She had successful restoration of sinus rhythm.  Pt is now still having a few palpitations mostly at night but is much improved since ablation. No problems with bilateral groin punctures sites. She denies chest pain, shortness of breath, orthopnea, PND, edema or syncope/near syncope. She is tolerating the increase in ARB well. She is working on getting her energy back since her activities were very limited prior to her ablation. She is taking all medications as prescribed including Xarelto without any unusual bleeding. She is on Tikosyn therapy and has an  appointment with the Afib clinic on 10/22.  She doesn't like being on cholesterol medication but understands that it will benefit her with carotid artery disease.   Past Medical History:  Diagnosis Date  . Anemia   . Arthritis    fingers   . Blood transfusion    1968  . Breast cancer ([place]) 2009   T1N0M0 DCIS right breast  . Breast cancer ([place]) 2013   a. reocurrence right breast;  b. s/p Taxotere, Cytoxan and XRT  . Carotid stenosis    a. dopplers 1/02:  RICA 7-25%, LICA 36-64% => repeat due in 01/2013;  b.  Carotid US (4/03): RICA 4-74%; LICA 25-95% - f/u 6 mos  . Fracture of left patella 12/2016  . History of radiation therapy 07/10/12-08/13/12   right breast/chest wall  . Hodgkin's disease    stage 2b;  s/p XRT 1988  . HTN (hypertension)   . Hypothyroidism    2/2 XRT for Hodgkins Lymphoma  . LBBB (left bundle branch block)   . Mucositis 04/29/2012  . NICM (nonischemic cardiomyopathy) ([place])    a. EF as low as 45%;  b. LHC 5/05:  LM 10%, pLAD 30%, EF 45-50%,   c. Echo 5/13:  EF 60-65%, Gr 1 diast dysfn, MAC;   d.  Echo 11/13:  EF 60-65%, Gr 1 diast dysfn, Tr AI, mild MR;  d.  Echo 05/10/13: EF 63-87%, grade 1 diastolic dysfunction, PASP 36, small effusion.  Marland Kitchen PAF (paroxysmal atrial fibrillation) ([place])    a. dx in 2008;  b. CHADS2-VASc=5;  c. changed to Tikosyn during  admx 05/2013; d. Xarelto started 05/2013; e. s/p successful Afib ablation 06/30/2017  . PEA (Pulseless electrical activity) ([place])    a. admx 05/2013 for APE in setting of HTN emergency c/b PEA arrest and asp pneumonia, VDRF, AFib with RVR  . Raynaud's disease   . Status post radiation therapy 1988   mantle and periaortic     Past Surgical History:  Procedure Laterality Date  . ATRIAL FIBRILLATION ABLATION  06/30/2017  . ATRIAL FIBRILLATION ABLATION N/A 06/30/2017   Procedure: Atrial Fibrillation Ablation;  Surgeon: Constance Haw, MD;  Location: [place] [facility];  Service: Cardiovascular;  Laterality: N/A;  .  BREAST LUMPECTOMY  2009   right, lymph node biopsy  . BTL  1971  . DILATION AND CURETTAGE OF UTERUS    . IR RADIOLOGY PERIPHERAL GUIDED IV START  06/23/2017  . IR US GUIDE VASC ACCESS LEFT  06/23/2017  . MASS EXCISION  03/28/2012   Procedure: EXCISION MASS;  Surgeon: Edward Jolly, MD;  Location: [facility];  Service: General;  Laterality: Right;  Excision right chest wall mass  . MASTECTOMY  2009   right breast  . POLYPECTOMY  1990   D&C (also in 1977 and [place])   . PORT-A-CATH REMOVAL  10/03/2012   Procedure: REMOVAL PORT-A-CATH;  Surgeon: Edward Jolly, MD;  [facility];  Service: General;  Laterality: N/A;  . PORTACATH PLACEMENT  04/16/2012   Procedure: INSERTION PORT-A-CATH;  Surgeon: Edward Jolly, MD;  [facility];  Service: General;  Laterality: Left;  Placement of Port-a-Cath, left subclavian  . R vein ligation & stripping  1976  . Spleenectomy  1988   for Hodgkin's disease  . Thoractomy  1968  . TISSUE EXPANDER  REMOVAL W/ REPLACEMENT OF IMPLANT    . TISSUE EXPANDER PLACEMENT     right breast  . TONSILLECTOMY AND ADENOIDECTOMY  1965    Current Medications: Current Meds  Medication Sig  . acetaminophen (TYLENOL) 325 MG tablet Take 650 mg by mouth at bedtime as needed for moderate pain.  Marland Kitchen atorvastatin (LIPITOR) 10 MG tablet Take 1 tablet (10 mg total) by mouth daily at 6 PM.  . b complex vitamins capsule Take 1 capsule by mouth daily after breakfast.  . calcium carbonate (OS-CAL) 600 MG TABS Take 600 mg by mouth at bedtime.   . carvedilol (COREG) 12.5 MG tablet TAKE 1 TABLET(12.5 MG) BY MOUTH TWICE DAILY WITH A MEAL  . Cholecalciferol (VITAMIN D) 2000 UNITS CAPS Take 2,000 Units by mouth daily with lunch.   . dofetilide (TIKOSYN) 250 MCG capsule TAKE 1 CAPSULE(250 MCG) BY MOUTH EVERY 12 HOURS  . levothyroxine (SYNTHROID, LEVOTHROID) 100 MCG tablet Take 100 mcg by mouth daily at 6 (six) AM.   . losartan (COZAAR) 50 MG tablet Take 1  tablet (50 mg total) by mouth 2 (two) times daily.  . LUTEIN-ZEAXANTHIN PO Take 1 capsule by mouth daily after breakfast.   . magnesium oxide (MAG-OX 400) 400 MG tablet Take 400 mg by mouth 2 (two) times daily.   . Multiple Vitamin (MULTIVITAMIN) tablet Take 1 tablet by mouth daily after breakfast. "Natural Vitamin"  . OVER THE COUNTER MEDICATION Osteodenz Capsules: Take 1 capsule by mouth at bedtime  . OVER THE COUNTER MEDICATION Vegetarian Omega Green: Take 3 capsules by mouth daily with lunch  . rivaroxaban (XARELTO) 20 MG TABS tablet TAKE 1 TABLET BY MOUTH DAILY WITH DINNER     Allergies:   Ace inhibitors;  Benazepril; Epinephrine; Potassium-containing compounds; Prednisolone; and Taxotere [docetaxel]   Social History   Social History  . Marital status: Widowed    Spouse name: Donnie  . Number of children: N/A  . Years of education: N/A   Occupational History  . [employer]   Social History Main Topics  . Smoking status: Never Smoker  . Smokeless tobacco: Never Used  . Alcohol use No  . Drug use: No  . Sexual activity: Not Currently     Comment: post menopausal, HRT x 8 yrs   Other Topics Concern  . None   Social History Narrative   Regular exercise. Retired and married.      Family History: The patient's family history includes Hypertension in her mother. There is no history of Cancer. ROS:   Please see the history of present illness.     All other systems reviewed and are negative.  EKGs/Labs/Other Studies Reviewed:    The following studies were reviewed today:  Atrial fibrillation ablation 06/30/2017: PROCEDURES: 1. Comprehensive electrophysiologic study. 2. Coronary sinus pacing and recording. 3. Three-dimensional mapping of atrial fibrillation with additional mapping and ablation of a second discrete focus 4. Ablation of atrial fibrillation with additional mapping and ablation of a second discrete focus 5. Intracardiac  echocardiography. 6. Transseptal puncture of an intact septum. 7. Arrhythmia induction with pacing with dobutamine infusion  CONCLUSIONS: 1. Sinus rhythm upon presentation.  2. Successful electrical isolation and anatomical encircling of all four pulmonary veins with radiofrequency current. 3. No inducible arrhythmias following ablation both on and off of Isuprel 4. No early apparent complications.  Echocardiogram 05/05/17 Study Conclusions - Left ventricle: The cavity size was normal. There was mild   concentric hypertrophy. Systolic function was normal. The   estimated ejection fraction was in the range of 55% to 60%. Wall   motion was normal; there were no regional wall motion   abnormalities. Features are consistent with a pseudonormal left   ventricular filling pattern, with concomitant abnormal relaxation   and increased filling pressure (grade 2 diastolic dysfunction).   Doppler parameters are consistent with high ventricular filling   pressure. - Aortic valve: Poorly visualized. Trileaflet; mildly thickened,   mildly calcified leaflets. There was trivial regurgitation. - Mitral valve: There was moderate regurgitation. - Left atrium: The atrium was mildly dilated. - Tricuspid valve: There was mild regurgitation. - Pulmonary arteries: PA peak pressure: 39 mm Hg (S). - Impressions: The right ventricular systolic pressure was   increased consistent with mild pulmonary hypertension.  Impressions: - Normal LVF with EF 60-65% with moderate MR, mild pulmonary htn   with PASP 83mHg. Compared to prior echo, MR remains moderate and   PASP has increased form 33 to 351mg. The right ventricular   systolic pressure was increased consistent with mild pulmonary   hypertension.  Carotid US[place]2/4/17  R 40-59%; L 60-79%   LHC 5/05 LAD 30% _____________   EKG:  EKG is  ordered today.  The ekg ordered today demonstrates Normal sinus rhythm at 78 bpm with non-specific  intraventricular block  Recent Labs: 04/21/2017: Magnesium 2.2; TSH 1.990 06/20/2017: BUN 19; Creatinine, Ser 0.78; Potassium 4.6; Sodium 141 07/01/2017: Hemoglobin 12.9; Platelets 259   Recent Lipid Panel    Component Value Date/Time   CHOL 163 05/18/2013 0440   TRIG 175 (H) 05/18/2013 0440   HDL 26 (L) 05/18/2013 0440   CHOLHDL 6.3 05/18/2013 0440   VLDL 35 05/18/2013 0440   LDLCALC 102 (H) 05/18/2013  52    Physical Exam:    VS:  BP 100/64 (BP Location: Left Arm, Patient Position: Sitting, Cuff Size: Normal)   Pulse 78   Ht 5' 2"  (1.575 m)   Wt 137 lb 3.2 oz (62.2 kg)   LMP 03/08/1995   BMI 25.09 kg/m     Wt Readings from Last 3 Encounters:  07/24/17 137 lb 3.2 oz (62.2 kg)  07/01/17 136 lb 6.4 oz (61.9 kg)  06/20/17 135 lb 12.8 oz (61.6 kg)     Physical Exam  Constitutional: She is oriented to person, place, and time. She appears well-developed and well-nourished. No distress.  HENT:  Head: Normocephalic and atraumatic.  Neck: Normal range of motion. Neck supple. No JVD present. Carotid bruit is not present.  Cardiovascular: Normal rate, regular rhythm and normal heart sounds.  Exam reveals no gallop and no friction rub.   No murmur heard. Pulmonary/Chest: Effort normal and breath sounds normal. No respiratory distress. She has no wheezes. She has no rales.  Abdominal: Soft. There is no tenderness.  Musculoskeletal: Normal range of motion. She exhibits no edema.  Neurological: She is alert and oriented to person, place, and time.  Skin: Skin is warm and dry.  Psychiatric: She has a normal mood and affect. Her behavior is normal.   Bilateral groin sites with minimal faint ecchymosis, no edema or drainage.    ASSESSMENT:    1. Essential hypertension   2. Chronic diastolic CHF (congestive heart failure) ([place])   3. PAF (paroxysmal atrial fibrillation) ([facility])    PLAN:    In order of problems listed above:  1. Paroxysmal atrial fibrillation:  -S/P atrial fib  ablation on 06/30/17 -Maintaining sinus rhythm on Tikosyn therapy and carvedilol. Pt has appointment with afib clinic on 8/15. -This patients CHA2DS2-VASc Score and unadjusted Ischemic Stroke Rate (% per year) is equal to 3.2 % stroke rate/year from a score of 3. Pt is on Xarelto for stroke risk reduction with no bleeding abnormalities.   2. Chronic diastolic heart failure -Currently euvoliemic. On beta blocker and ARB which she is tolerating well.   3. Hypertension -Losartan was increase due to elevated BP at last appt. BP is well controlled. A little low today at 100/64, but pt denies any adverse symptoms. She states that she is tolerating the increased dose well.   4. Carotid artery disease -Carotid U/S 11/2016: R 40-59%, L 60-79% -Pt asymptomatic -Continue statin   Medication Adjustments/Labs and Tests Ordered: Current medicines are reviewed at length with the patient today.  Concerns regarding medicines are outlined above. Labs and tests ordered and medication changes are outlined in the patient instructions below:  There are no Patient Instructions on file for this visit.   Signed, Daune Perch, NP  07/24/2017 8:47 AM    [facility]

## 2017-07-24 ENCOUNTER — Ambulatory Visit (INDEPENDENT_AMBULATORY_CARE_PROVIDER_SITE_OTHER): Payer: Medicare Other | Admitting: Cardiology

## 2017-07-24 ENCOUNTER — Encounter: Payer: Self-pay | Admitting: Cardiology

## 2017-07-24 VITALS — BP 100/64 | HR 78 | Ht 62.0 in | Wt 137.2 lb

## 2017-07-24 DIAGNOSIS — I480 Paroxysmal atrial fibrillation: Secondary | ICD-10-CM | POA: Diagnosis not present

## 2017-07-24 DIAGNOSIS — I739 Peripheral vascular disease, unspecified: Secondary | ICD-10-CM

## 2017-07-24 DIAGNOSIS — I5032 Chronic diastolic (congestive) heart failure: Secondary | ICD-10-CM | POA: Diagnosis not present

## 2017-07-24 DIAGNOSIS — I779 Disorder of arteries and arterioles, unspecified: Secondary | ICD-10-CM | POA: Diagnosis not present

## 2017-07-24 DIAGNOSIS — I10 Essential (primary) hypertension: Secondary | ICD-10-CM

## 2017-07-24 DIAGNOSIS — I6523 Occlusion and stenosis of bilateral carotid arteries: Secondary | ICD-10-CM

## 2017-07-24 NOTE — Patient Instructions (Signed)
Medication Instructions:  1. Your physician recommends that you continue on your current medications as directed. Please refer to the Current Medication list given to you today.   Labwork: NONE ORDERED   Testing/Procedures: NONE ORDERED   Follow-Up: 1. KEEP YOUR APPT WITH THE A-FIB CLINIC 08/02/17  2. KEEP YOUR APPT WITH DR. Curt Bears 10/09/17  Any Other Special Instructions Will Be Listed Below (If Applicable).     If you need a refill on your cardiac medications before your next appointment, please call your pharmacy.

## 2017-08-02 ENCOUNTER — Encounter (HOSPITAL_COMMUNITY): Payer: Self-pay | Admitting: Nurse Practitioner

## 2017-08-02 ENCOUNTER — Ambulatory Visit (HOSPITAL_COMMUNITY)
Admission: RE | Admit: 2017-08-02 | Discharge: 2017-08-02 | Disposition: A | Discharge status: Home / Self Care | Payer: Medicare Other | Source: Ambulatory Visit | Attending: Nurse Practitioner | Admitting: Nurse Practitioner

## 2017-08-02 VITALS — BP 132/82 | HR 74 | Ht 62.0 in | Wt 137.2 lb

## 2017-08-02 DIAGNOSIS — Z7901 Long term (current) use of anticoagulants: Secondary | ICD-10-CM | POA: Insufficient documentation

## 2017-08-02 DIAGNOSIS — E039 Hypothyroidism, unspecified: Secondary | ICD-10-CM | POA: Diagnosis not present

## 2017-08-02 DIAGNOSIS — Z888 Allergy status to other drugs, medicaments and biological substances status: Secondary | ICD-10-CM | POA: Insufficient documentation

## 2017-08-02 DIAGNOSIS — I480 Paroxysmal atrial fibrillation: Secondary | ICD-10-CM

## 2017-08-02 DIAGNOSIS — I428 Other cardiomyopathies: Secondary | ICD-10-CM | POA: Diagnosis not present

## 2017-08-02 DIAGNOSIS — Z853 Personal history of malignant neoplasm of breast: Secondary | ICD-10-CM | POA: Diagnosis not present

## 2017-08-02 DIAGNOSIS — I5030 Unspecified diastolic (congestive) heart failure: Secondary | ICD-10-CM | POA: Diagnosis not present

## 2017-08-02 DIAGNOSIS — Z79899 Other long term (current) drug therapy: Secondary | ICD-10-CM | POA: Insufficient documentation

## 2017-08-02 DIAGNOSIS — I4891 Unspecified atrial fibrillation: Secondary | ICD-10-CM | POA: Diagnosis not present

## 2017-08-02 DIAGNOSIS — I110 Hypertensive heart disease with heart failure: Secondary | ICD-10-CM | POA: Insufficient documentation

## 2017-08-02 DIAGNOSIS — Z8249 Family history of ischemic heart disease and other diseases of the circulatory system: Secondary | ICD-10-CM | POA: Diagnosis not present

## 2017-08-02 DIAGNOSIS — Z8571 Personal history of Hodgkin lymphoma: Secondary | ICD-10-CM | POA: Insufficient documentation

## 2017-08-02 DIAGNOSIS — Z923 Personal history of irradiation: Secondary | ICD-10-CM | POA: Insufficient documentation

## 2017-08-02 NOTE — Addendum Note (Signed)
Encounter addended by: Sherran Needs, NP on: 08/02/2017  1:05 PM<BR>    Actions taken: LOS modified

## 2017-08-02 NOTE — Progress Notes (Addendum)
Primary Care Physician: [name], MD Referring Physician: Dr. Johnsie Cancel EP: Dr. Consuela Mimes [name] is a 71 y.o. female with a h/o afib that has been on tikosyn x 4 years in the afib clinic for evaluation for episodes of afib.  Also hx of NICM with improved EF, diastolic HF, HTN, LBBB, hypothyroidism, Hodgkin's disease, breast CA, PAF, carotid stenosis. Patient underwent radiation therapy for Hodgkin's lymphoma in 1998, mastectomy for breast cancer in 2009 and recurrent breast CA in 2013. EF has been as low as 45% in the past. This is improved to normal over time. Flecainide was started in 12/13 for recurrent A. fib. ETT was negative for proarrhythmia. She was admitted in 5/14 with pulmonary edema in the setting of hypertensive crisis complicated by PEA arrest. She was switched to dofetilide.  Breakthrough afib recently seen on event monitor that was placed by Dr. Johnsie Cancel and she is in the afib clinic to discuss options to continue in SR.Couple of events reviewed and both were shorted lived, one episode was 18 secs but with v rate up to 185 bpm.  F/u 8/15, she is in the afib clinic for f/u of ablation. She has done well without any awareness of afib. No swallowing or groin issues. Continues on dofetilide and xarelto. No missed doses.  Today, she denies symptoms of palpitations, chest pain, shortness of breath, orthopnea, PND, lower extremity edema, dizziness, presyncope, syncope, or neurologic sequela. The patient is tolerating medications without difficulties and is otherwise without complaint today.   Past Medical History:  Diagnosis Date  . Anemia   . Arthritis    fingers   . Blood transfusion    1968  . Breast cancer ([place]) 2009   T1N0M0 DCIS right breast  . Breast cancer ([place]) 2013   a. reocurrence right breast;  b. s/p Taxotere, Cytoxan and XRT  . Carotid stenosis    a. dopplers 4/28:  RICA 7-68%, LICA 11-57% => repeat due in 01/2013;  b.  Carotid US (2/62): RICA 0-35%;  LICA 59-74% - f/u 6 mos  . Fracture of left patella 12/2016  . History of radiation therapy 07/10/12-08/13/12   right breast/chest wall  . Hodgkin's disease    stage 2b;  s/p XRT 1988  . HTN (hypertension)   . Hypothyroidism    2/2 XRT for Hodgkins Lymphoma  . LBBB (left bundle branch block)   . Mucositis 04/29/2012  . NICM (nonischemic cardiomyopathy) ([place])    a. EF as low as 45%;  b. LHC 5/05:  LM 10%, pLAD 30%, EF 45-50%,   c. Echo 5/13:  EF 60-65%, Gr 1 diast dysfn, MAC;   d.  Echo 11/13:  EF 60-65%, Gr 1 diast dysfn, Tr AI, mild MR;  d.  Echo 05/10/13: EF 16-38%, grade 1 diastolic dysfunction, PASP 36, small effusion.  Marland Kitchen PAF (paroxysmal atrial fibrillation) ([place])    a. dx in 2008;  b. CHADS2-VASc=5;  c. changed to [place] during admx 05/2013; d. Xarelto started 05/2013; e. s/p successful Afib ablation 06/30/2017  . PEA (Pulseless electrical activity) ([place])    a. admx 05/2013 for APE in setting of HTN emergency c/b PEA arrest and asp pneumonia, VDRF, AFib with RVR  . Raynaud's disease   . Status post radiation therapy 1988   mantle and periaortic    Past Surgical History:  Procedure Laterality Date  . ATRIAL FIBRILLATION ABLATION  06/30/2017  . ATRIAL FIBRILLATION ABLATION N/A 06/30/2017   Procedure: Atrial Fibrillation Ablation;  Surgeon: Constance Haw, MD;  Location: [facility];  Service: Cardiovascular;  Laterality: N/A;  . BREAST LUMPECTOMY  2009   right, lymph node biopsy  . BTL  1971  . DILATION AND CURETTAGE OF UTERUS    . IR RADIOLOGY PERIPHERAL GUIDED IV START  06/23/2017  . IR US GUIDE VASC ACCESS LEFT  06/23/2017  . MASS EXCISION  03/28/2012   Procedure: EXCISION MASS;  Surgeon: Edward Jolly, MD;  Location: [facility];  Service: General;  Laterality: Right;  Excision right chest wall mass  . MASTECTOMY  2009   right breast  . POLYPECTOMY  1990   D&C (also in 1977 and [place])   . PORT-A-CATH REMOVAL  10/03/2012   Procedure: REMOVAL  PORT-A-CATH;  Surgeon: Edward Jolly, MD;  [facility];  Service: General;  Laterality: N/A;  . PORTACATH PLACEMENT  04/16/2012   Procedure: INSERTION PORT-A-CATH;  Surgeon: Edward Jolly, MD;  [facility];  Service: General;  Laterality: Left;  Placement of Port-a-Cath, left subclavian  . R vein ligation & stripping  1976  . Spleenectomy  1988   for Hodgkin's disease  . Thoractomy  1968  . TISSUE EXPANDER  REMOVAL W/ REPLACEMENT OF IMPLANT    . TISSUE EXPANDER PLACEMENT     right breast  . TONSILLECTOMY AND ADENOIDECTOMY  1965    Current Outpatient Prescriptions  Medication Sig Dispense Refill  . acetaminophen (TYLENOL) 325 MG tablet Take 650 mg by mouth at bedtime as needed for moderate pain.    Marland Kitchen atorvastatin (LIPITOR) 10 MG tablet Take 1 tablet (10 mg total) by mouth daily at 6 PM. 30 tablet 11  . b complex vitamins capsule Take 1 capsule by mouth daily after breakfast.    . calcium carbonate (OS-CAL) 600 MG TABS Take 600 mg by mouth at bedtime.     . carvedilol (COREG) 12.5 MG tablet TAKE 1 TABLET(12.5 MG) BY MOUTH TWICE DAILY WITH A MEAL 60 tablet 10  . Cholecalciferol (VITAMIN D) 2000 UNITS CAPS Take 2,000 Units by mouth daily with lunch.     . dofetilide (TIKOSYN) 250 MCG capsule TAKE 1 CAPSULE(250 MCG) BY MOUTH EVERY 12 HOURS 180 capsule 2  . levothyroxine (SYNTHROID, LEVOTHROID) 100 MCG tablet Take 100 mcg by mouth daily at 6 (six) AM.     . losartan (COZAAR) 50 MG tablet Take 1 tablet (50 mg total) by mouth 2 (two) times daily. 180 tablet 3  . LUTEIN-ZEAXANTHIN PO Take 1 capsule by mouth daily after breakfast.     . magnesium oxide (MAG-OX 400) 400 MG tablet Take 400 mg by mouth 2 (two) times daily.     . Multiple Vitamin (MULTIVITAMIN) tablet Take 1 tablet by mouth daily after breakfast. "Natural Vitamin"    . OVER THE COUNTER MEDICATION Osteodenz Capsules: Take 1 capsule by mouth at bedtime    . OVER THE COUNTER MEDICATION Vegetarian Omega Green: Take 3  capsules by mouth daily with lunch    . rivaroxaban (XARELTO) 20 MG TABS tablet TAKE 1 TABLET BY MOUTH DAILY WITH DINNER 90 tablet 3   No current facility-administered medications for this encounter.     Allergies  Allergen Reactions  . Ace Inhibitors     Cough  . Benazepril     cough  . Epinephrine     REACTION: BUZZ; INCREASED HEART RATE  . Potassium-Containing Compounds     Projectile vomiting--liquid potassium   . Prednisolone Other (See Comments)  ARRYTHMIAS  . Taxotere [Docetaxel] Other (See Comments)    Fluid overload    Social History   Social History  . Marital status: Widowed    Spouse name: Donnie  . Number of children: N/A  . Years of education: N/A   Occupational History  . [employer]   Social History Main Topics  . Smoking status: Never Smoker  . Smokeless tobacco: Never Used  . Alcohol use No  . Drug use: No  . Sexual activity: Not Currently     Comment: post menopausal, HRT x 8 yrs   Other Topics Concern  . Not on file   Social History Narrative   Regular exercise. Retired and married.     Family History  Problem Relation Age of Onset  . Hypertension Mother   . Cancer Neg Hx     ROS- All systems are reviewed and negative except as per the HPI above  Physical Exam: Vitals:   08/02/17 1029  BP: 132/82  Pulse: 74  Weight: 137 lb 3.2 oz (62.2 kg)  Height: _0  (1.575 m)   Wt Readings from Last 3 Encounters:  08/02/17 137 lb 3.2 oz (62.2 kg)  07/24/17 137 lb 3.2 oz (62.2 kg)  07/01/17 136 lb 6.4 oz (61.9 kg)    Labs: Lab Results  Component Value Date   NA 141 06/20/2017   K 4.6 06/20/2017   CL 100 06/20/2017   CO2 26 06/20/2017   GLUCOSE 89 06/20/2017   BUN 19 06/20/2017   CREATININE 0.78 06/20/2017   CALCIUM 9.3 06/20/2017   PHOS 3.8 05/10/2013   MG 2.2 04/21/2017   Lab Results  Component Value Date   INR 1.71 08/14/2016   Lab Results  Component Value Date   CHOL 163 05/18/2013   HDL 26 (L)  05/18/2013   LDLCALC 102 (H) 05/18/2013   TRIG 175 (H) 05/18/2013     GEN- The patient is well appearing, alert and oriented x 3 today.   Head- normocephalic, atraumatic Eyes-  Sclera clear, conjunctiva pink Ears- hearing intact Oropharynx- clear Neck- supple, no JVP Lymph- no cervical lymphadenopathy Lungs- Clear to ausculation bilaterally, normal work of breathing Heart- Regular rate and rhythm, no murmurs, rubs or gallops, PMI not laterally displaced GI- soft, NT, ND, + BS Extremities- no clubbing, cyanosis, or edema MS- no significant deformity or atrophy Skin- no rash or lesion Psych- euthymic mood, full affect Neuro- strength and sensation are intact  EKG-NSR at 74 bpm, pr int 130 ms, qrs int 124 ms,sd qtc 470 ms Epic records reviewed Echo-05/05/17-Study Conclusions Recent labs reviewed and TSH normal  - Left ventricle: The cavity size was normal. There was mild   concentric hypertrophy. Systolic function was normal. The   estimated ejection fraction was in the range of 55% to 60%. Wall   motion was normal; there were no regional wall motion   abnormalities. Features are consistent with a pseudonormal left   ventricular filling pattern, with concomitant abnormal relaxation   and increased filling pressure (grade 2 diastolic dysfunction).   Doppler parameters are consistent with high ventricular filling   pressure. - Aortic valve: Poorly visualized. Trileaflet; mildly thickened,   mildly calcified leaflets. There was trivial regurgitation. - Mitral valve: There was moderate regurgitation. - Left atrium: The atrium was mildly dilated.35 mm - Tricuspid valve: There was mild regurgitation. - Pulmonary arteries: PA peak pressure: 39 mm Hg (S). - Impressions: The right ventricular systolic pressure was   increased  consistent with mild pulmonary hypertension.  Impressions:  - Normal LVF with EF 60-65% with moderate MR, mild pulmonary htn   with PASP 56mHg. Compared  to prior echo, MR remains moderate and   PASP has increased form 33 to 367mg. The right ventricular   systolic pressure was increased consistent with mild pulmonary   hypertension.    Assessment and Plan: 1. afib S/p ablation and is doing well with maintenance of SR Continue Tikosyn 250 mcg bid and carvedilol 12.5 mg bid Continue xarelto 20 mg for a chadsvasc score of at least 2, reminded not to miss doses with the recovery period of another two months   DoButch Penny. Nelani Schmelzle, ANNew Franklin Hospital[address]rSouth [place] HeightsNC [phone]

## 2017-08-04 ENCOUNTER — Ambulatory Visit (HOSPITAL_COMMUNITY)
Admission: RE | Admit: 2017-08-04 | Discharge: 2017-08-04 | Disposition: A | Discharge status: Home / Self Care | Payer: Medicare Other | Source: Ambulatory Visit | Attending: Hematology and Oncology | Admitting: Hematology and Oncology

## 2017-08-04 DIAGNOSIS — C50911 Malignant neoplasm of unspecified site of right female breast: Secondary | ICD-10-CM | POA: Diagnosis not present

## 2017-08-04 DIAGNOSIS — Z170 Estrogen receptor positive status [ER+]: Secondary | ICD-10-CM | POA: Diagnosis not present

## 2017-08-04 DIAGNOSIS — Z9011 Acquired absence of right breast and nipple: Secondary | ICD-10-CM | POA: Diagnosis not present

## 2017-08-04 DIAGNOSIS — Z9882 Breast implant status: Secondary | ICD-10-CM | POA: Insufficient documentation

## 2017-08-04 DIAGNOSIS — C3490 Malignant neoplasm of unspecified part of unspecified bronchus or lung: Secondary | ICD-10-CM | POA: Diagnosis not present

## 2017-08-04 LAB — POCT I-STAT CREATININE: Creatinine, Ser: 0.8 mg/dL (ref 0.44–1.00)

## 2017-08-04 MED ORDER — GADOBENATE DIMEGLUMINE 529 MG/ML IV SOLN
15.0000 mL | Freq: Once | INTRAVENOUS | Status: AC | PRN
  Administered 2017-08-04: 12 mL via INTRAVENOUS

## 2017-09-20 ENCOUNTER — Other Ambulatory Visit: Payer: Self-pay | Admitting: Cardiovascular Disease

## 2017-09-21 ENCOUNTER — Emergency Department (HOSPITAL_COMMUNITY): Payer: Medicare Other

## 2017-09-21 ENCOUNTER — Emergency Department (HOSPITAL_COMMUNITY)
Admission: EM | Admit: 2017-09-21 | Discharge: 2017-09-21 | Disposition: A | Discharge status: Home / Self Care | Payer: Medicare Other | Attending: Emergency Medicine | Admitting: Emergency Medicine

## 2017-09-21 ENCOUNTER — Ambulatory Visit: Payer: Medicare Other | Admitting: Hematology and Oncology

## 2017-09-21 DIAGNOSIS — R0602 Shortness of breath: Secondary | ICD-10-CM | POA: Diagnosis not present

## 2017-09-21 DIAGNOSIS — I110 Hypertensive heart disease with heart failure: Secondary | ICD-10-CM | POA: Diagnosis not present

## 2017-09-21 DIAGNOSIS — I5032 Chronic diastolic (congestive) heart failure: Secondary | ICD-10-CM | POA: Diagnosis not present

## 2017-09-21 DIAGNOSIS — Z7901 Long term (current) use of anticoagulants: Secondary | ICD-10-CM | POA: Diagnosis not present

## 2017-09-21 DIAGNOSIS — Z79899 Other long term (current) drug therapy: Secondary | ICD-10-CM | POA: Insufficient documentation

## 2017-09-21 DIAGNOSIS — E039 Hypothyroidism, unspecified: Secondary | ICD-10-CM | POA: Insufficient documentation

## 2017-09-21 DIAGNOSIS — I2510 Atherosclerotic heart disease of native coronary artery without angina pectoris: Secondary | ICD-10-CM | POA: Insufficient documentation

## 2017-09-21 DIAGNOSIS — R0600 Dyspnea, unspecified: Secondary | ICD-10-CM

## 2017-09-21 LAB — BASIC METABOLIC PANEL
ANION GAP: 10 (ref 5–15)
BUN: 16 mg/dL (ref 6–20)
CALCIUM: 9 mg/dL (ref 8.9–10.3)
CO2: 23 mmol/L (ref 22–32)
Chloride: 103 mmol/L (ref 101–111)
Creatinine, Ser: 0.7 mg/dL (ref 0.44–1.00)
GFR calc Af Amer: >60 mL/min (ref >60)
GLUCOSE: 104 mg/dL — AB (ref 65–99)
Potassium: 4.2 mmol/L (ref 3.5–5.1)
SODIUM: 136 mmol/L (ref 135–145)

## 2017-09-21 LAB — CBC
HCT: 39.4 % (ref 36.0–46.0)
HEMOGLOBIN: 13 g/dL (ref 12.0–15.0)
MCH: 30.2 pg (ref 26.0–34.0)
MCHC: 33 g/dL (ref 30.0–36.0)
MCV: 91.4 fL (ref 78.0–100.0)
Platelets: 270 10*3/uL (ref 150–400)
RBC: 4.31 MIL/uL (ref 3.87–5.11)
RDW: 14.2 % (ref 11.5–15.5)
WBC: 8.1 10*3/uL (ref 4.0–10.5)

## 2017-09-21 LAB — I-STAT TROPONIN, ED: TROPONIN I, POC: 0 ng/mL (ref 0.00–0.08)

## 2017-09-21 MED ORDER — ALBUTEROL SULFATE (2.5 MG/3ML) 0.083% IN NEBU
INHALATION_SOLUTION | RESPIRATORY_TRACT | Status: AC
  Filled 2017-09-21: qty 6

## 2017-09-21 MED ORDER — ALBUTEROL SULFATE HFA 108 (90 BASE) MCG/ACT IN AERS
2.0000 | INHALATION_SPRAY | RESPIRATORY_TRACT | Status: DC | PRN
  Filled 2017-09-21: qty 6.7

## 2017-09-21 MED ORDER — ALBUTEROL SULFATE (2.5 MG/3ML) 0.083% IN NEBU
5.0000 mg | INHALATION_SOLUTION | Freq: Once | RESPIRATORY_TRACT | Status: AC
  Administered 2017-09-21: 5 mg via RESPIRATORY_TRACT

## 2017-09-21 NOTE — ED Provider Notes (Signed)
[facility] DEPT Provider Note   CSN: [id number] Arrival date & time: 09/21/17  0849     History   Chief Complaint Chief Complaint  Patient presents with  . Shortness of Breath    HPI [name] is a 71 y.o. female.  Patient with remot hx breast ca, remote hx lymphoma s/p radiation therapy, c/o sob/feeling congested/wheezy this AM.  Denies hx copd/asthma. Denies orthopnea or pnd. No associated palpitations. No syncope. Denies any current or recent chest pain or discomfort. No increased cough or sore throat. No fever or chills. Compliant w normal meds including anticoag therapy. States neb tx helped, and now feels at baseline. No leg pain or swelling.    The history is provided by the patient.  Shortness of Breath  Pertinent negatives include no fever, no headaches, no sore throat, no neck pain, no cough, no chest pain, no abdominal pain, no rash and no leg swelling.    Past Medical History:  Diagnosis Date  . Anemia   . Arthritis    fingers   . Blood transfusion    1968  . Breast cancer ([place]) 2009   T1N0M0 DCIS right breast  . Breast cancer ([place]) 2013   a. reocurrence right breast;  b. s/p Taxotere, Cytoxan and XRT  . Carotid stenosis    a. dopplers 1/76:  RICA 1-60%, LICA 73-71% => repeat due in 01/2013;  b.  Carotid US (0/62): RICA 6-94%; LICA 85-46% - f/u 6 mos  . Fracture of left patella 12/2016  . History of radiation therapy 07/10/12-08/13/12   right breast/chest wall  . Hodgkin's disease    stage 2b;  s/p XRT 1988  . HTN (hypertension)   . Hypothyroidism    2/2 XRT for Hodgkins Lymphoma  . LBBB (left bundle branch block)   . Mucositis 04/29/2012  . NICM (nonischemic cardiomyopathy) ([place])    a. EF as low as 45%;  b. LHC 5/05:  LM 10%, pLAD 30%, EF 45-50%,   c. Echo 5/13:  EF 60-65%, Gr 1 diast dysfn, MAC;   d.  Echo 11/13:  EF 60-65%, Gr 1 diast dysfn, Tr AI, mild MR;  d.  Echo 05/10/13: EF 27-03%, grade 1 diastolic dysfunction, PASP 36, small effusion.  Marland Kitchen  PAF (paroxysmal atrial fibrillation) ([place])    a. dx in 2008;  b. CHADS2-VASc=5;  c. changed to [place] during admx 05/2013; d. Xarelto started 05/2013; e. s/p successful Afib ablation 06/30/2017  . PEA (Pulseless electrical activity) ([place])    a. admx 05/2013 for APE in setting of HTN emergency c/b PEA arrest and asp pneumonia, VDRF, AFib with RVR  . Raynaud's disease   . Status post radiation therapy 1988   mantle and periaortic     Patient Active Problem List   Diagnosis Date Noted  . NICM (nonischemic cardiomyopathy) ([place]) 08/15/2016  . PAF (paroxysmal atrial fibrillation) ([place]) 08/14/2016  . Cardiac arrest ([place]) 05/23/2013  . Pneumococcal pneumonia ([place]) 05/23/2013  . Pleural effusion 05/15/2013  . Carotid artery disease ([place]) 10/26/2012  . Chest wall recurrence of breast cancer ([place]) 07/09/2012  . Dyspnea 05/09/2012  . Normocytic anemia 05/09/2012  . Sinus tachycardia 04/29/2012  . Mucositis 04/29/2012  . Status post radiation therapy   . Breast mass, right 03/23/2012  . Chronic diastolic CHF (congestive heart failure) ([place]) 11/28/2011  . Carotid bruit 11/28/2011  .  Cancer of Breast T1bN0M0 triple neg S/P mastectomy/reconstruction 04/2008 11/25/2011  . Carcinoma in situ of breast 05/02/2009  . Hypothyroidism  05/02/2009  . Essential hypertension 05/02/2009  . CARDIOMYOPATHY, SECONDARY 05/02/2009  . LBBB (left bundle branch block) 05/02/2009    Past Surgical History:  Procedure Laterality Date  . ATRIAL FIBRILLATION ABLATION  06/30/2017  . ATRIAL FIBRILLATION ABLATION N/A 06/30/2017   Procedure: Atrial Fibrillation Ablation;  Surgeon: Constance Haw, MD;  Location: [facility];  Service: Cardiovascular;  Laterality: N/A;  . BREAST LUMPECTOMY  2009   right, lymph node biopsy  . BTL  1971  . DILATION AND CURETTAGE OF UTERUS    . IR RADIOLOGY PERIPHERAL GUIDED IV START  06/23/2017  . IR US GUIDE VASC ACCESS LEFT  06/23/2017  . MASS EXCISION  03/28/2012   Procedure:  EXCISION MASS;  Surgeon: Edward Jolly, MD;  Location: [facility];  Service: General;  Laterality: Right;  Excision right chest wall mass  . MASTECTOMY  2009   right breast  . POLYPECTOMY  1990   D&C (also in 1977 and [place])   . PORT-A-CATH REMOVAL  10/03/2012   Procedure: REMOVAL PORT-A-CATH;  Surgeon: Edward Jolly, MD;  Location: [facility];  Service: General;  Laterality: N/A;  . PORTACATH PLACEMENT  04/16/2012   Procedure: INSERTION PORT-A-CATH;  Surgeon: Edward Jolly, MD;  Location: [facility];  Service: General;  Laterality: Left;  Placement of Port-a-Cath, left subclavian  . R vein ligation & stripping  1976  . Spleenectomy  1988   for Hodgkin's disease  . Thoractomy  1968  . TISSUE EXPANDER  REMOVAL W/ REPLACEMENT OF IMPLANT    . TISSUE EXPANDER PLACEMENT     right breast  . TONSILLECTOMY AND ADENOIDECTOMY  1965    OB History    No data available       Home Medications    Prior to Admission medications   Medication Sig Start Date End Date Taking? Authorizing Provider  acetaminophen (TYLENOL) 325 MG tablet Take 650 mg by mouth at bedtime as needed for moderate pain.    [provider]  atorvastatin (LIPITOR) 10 MG tablet Take 1 tablet (10 mg total) by mouth daily at 6 PM. 12/14/16   Josue Hector, MD  b complex vitamins capsule Take 1 capsule by mouth daily after breakfast.    [provider]  calcium carbonate (OS-CAL) 600 MG TABS Take 600 mg by mouth at bedtime.     [provider]  carvedilol (COREG) 12.5 MG tablet TAKE 1 TABLET(12.5 MG) BY MOUTH TWICE DAILY WITH A MEAL 07/03/17   Josue Hector, MD  Cholecalciferol (VITAMIN D) 2000 UNITS CAPS Take 2,000 Units by mouth daily with lunch.     [provider]  dofetilide (TIKOSYN) 250 MCG capsule TAKE 1 CAPSULE(250 MCG) BY MOUTH EVERY 12 HOURS 11/28/16   Josue Hector, MD  levothyroxine (SYNTHROID, LEVOTHROID) 100 MCG tablet Take 100 mcg by mouth daily at 6  (six) AM.     [provider]  losartan (COZAAR) 50 MG tablet Take 1 tablet (50 mg total) by mouth 2 (two) times daily. 06/20/17 09/18/17  Camnitz, Ocie Doyne, MD  LUTEIN-ZEAXANTHIN PO Take 1 capsule by mouth daily after breakfast.     [provider]  magnesium oxide (MAG-OX 400) 400 MG tablet Take 400 mg by mouth 2 (two) times daily.     [provider]  Multiple Vitamin (MULTIVITAMIN) tablet Take 1 tablet by mouth daily after breakfast. "Natural Vitamin"    [provider]  OVER THE COUNTER MEDICATION Osteodenz Capsules:  Take 1 capsule by mouth at bedtime    [provider]  OVER THE COUNTER MEDICATION Vegetarian Omega Green: Take 3 capsules by mouth daily with lunch    [provider]  rivaroxaban (XARELTO) 20 MG TABS tablet TAKE 1 TABLET BY MOUTH DAILY WITH DINNER 06/02/17   Josue Hector, MD    Family History Family History  Problem Relation Age of Onset  . Hypertension Mother   . Cancer Neg Hx     Social History Social History  Substance Use Topics  . Smoking status: Never Smoker  . Smokeless tobacco: Never Used  . Alcohol use No     Allergies   Ace inhibitors; Benazepril; Epinephrine; Potassium-containing compounds; Prednisolone; and Taxotere [docetaxel]   Review of Systems Review of Systems  Constitutional: Negative for chills and fever.  HENT: Negative for sore throat.   Eyes: Negative for redness.  Respiratory: Positive for shortness of breath. Negative for cough.   Cardiovascular: Negative for chest pain, palpitations and leg swelling.  Gastrointestinal: Negative for abdominal pain.  Genitourinary: Negative for flank pain.  Musculoskeletal: Negative for back pain and neck pain.  Skin: Negative for rash.  Neurological: Negative for headaches.  Hematological: Does not bruise/bleed easily.  Psychiatric/Behavioral: Negative for confusion.     Physical Exam Updated Vital Signs BP (!) 195/95 (BP Location:  Right Arm)   Pulse 84   Temp 97.7 F (36.5 C) (Oral)   Resp (!) 25   LMP 03/08/1995   SpO2 100%   Physical Exam  Constitutional: She appears well-developed and well-nourished. No distress.  HENT:  Head: Atraumatic.  Eyes: Conjunctivae are normal. No scleral icterus.  Neck: Neck supple. No tracheal deviation present.  Cardiovascular: Normal rate, regular rhythm, normal heart sounds and intact distal pulses.  Exam reveals no gallop and no friction rub.   No murmur heard. Pulmonary/Chest: Effort normal and breath sounds normal. No respiratory distress.  Abdominal: Soft. Normal appearance and bowel sounds are normal. She exhibits no distension. There is no tenderness.  Genitourinary:  Genitourinary Comments: No cva tenderness  Musculoskeletal: She exhibits no edema.  Neurological: She is alert.  Skin: Skin is warm and dry. No rash noted. She is not diaphoretic.  Psychiatric: She has a normal mood and affect.  Nursing note and vitals reviewed.    ED Treatments / Results  Labs (all labs ordered are listed, but only abnormal results are displayed) Results for orders placed or performed during the hospital encounter of 22/29/79  Basic metabolic panel  Result Value Ref Range   Sodium 136 135 - 145 mmol/L   Potassium 4.2 3.5 - 5.1 mmol/L   Chloride 103 101 - 111 mmol/L   CO2 23 22 - 32 mmol/L   Glucose, Bld 104 (H) 65 - 99 mg/dL   BUN 16 6 - 20 mg/dL   Creatinine, Ser 0.70 0.44 - 1.00 mg/dL   Calcium 9.0 8.9 - 10.3 mg/dL   GFR calc non Af Amer >60 >60 mL/min   GFR calc Af Amer >60 >60 mL/min   Anion gap 10 5 - 15  CBC  Result Value Ref Range   WBC 8.1 4.0 - 10.5 K/uL   RBC 4.31 3.87 - 5.11 MIL/uL   Hemoglobin 13.0 12.0 - 15.0 g/dL   HCT 39.4 36.0 - 46.0 %   MCV 91.4 78.0 - 100.0 fL   MCH 30.2 26.0 - 34.0 pg   MCHC 33.0 30.0 - 36.0 g/dL   RDW 14.2 11.5 - 15.5 %  Platelets 270 150 - 400 K/uL  I-stat troponin, ED  Result Value Ref Range   Troponin i, poc 0.00 0.00 - 0.08  ng/mL   Comment 3           Dg Chest 2 View  Result Date: 09/21/2017 CLINICAL DATA:  O awakened this morning with weakness and shortness of breath. History of lung scarring due to radiation therapy for breast and malignancy and Hodgkin's disease. Nonsmoker. EXAM: CHEST  2 VIEW COMPARISON:  Chest x-ray of August 15, 2016 FINDINGS: The patient has undergone previous right mastectomy. The lungs are well-expanded. The lung markings are increased inferiorly on the right but are stable. There is stable pleural thickening in the left apex.There is no pleural effusion. The heart and pulmonary vascularity are normal. There is calcification in the wall of the aortic arch. The mediastinum is normal in width. The observed bony thorax exhibits no acute abnormality. IMPRESSION: Post radiation changes on the right. Postsurgical change in the left apex. No acute pneumonia nor CHF. No pulmonary parenchymal masses are observed. Thoracic aortic atherosclerosis. Electronically Signed   By: David  Martinique M.D.   On: 09/21/2017 09:46    EKG  EKG Interpretation  Date/Time:  Thursday September 21 2017 08:49:10 EDT Ventricular Rate:  80 PR Interval:  170 QRS Duration: 130 QT Interval:  422 QTC Calculation: 486 R Axis:   -123 Text Interpretation:  Normal sinus rhythm Right superior axis deviation Non-specific intra-ventricular conduction block Non-specific ST-t changes No significant change since last tracing Confirmed by Lajean Saver [phone]) on 09/21/2017 11:41:58 AM       Radiology Dg Chest 2 View  Result Date: 09/21/2017 CLINICAL DATA:  O awakened this morning with weakness and shortness of breath. History of lung scarring due to radiation therapy for breast and malignancy and Hodgkin's disease. Nonsmoker. EXAM: CHEST  2 VIEW COMPARISON:  Chest x-ray of August 15, 2016 FINDINGS: The patient has undergone previous right mastectomy. The lungs are well-expanded. The lung markings are increased inferiorly on the right  but are stable. There is stable pleural thickening in the left apex.There is no pleural effusion. The heart and pulmonary vascularity are normal. There is calcification in the wall of the aortic arch. The mediastinum is normal in width. The observed bony thorax exhibits no acute abnormality. IMPRESSION: Post radiation changes on the right. Postsurgical change in the left apex. No acute pneumonia nor CHF. No pulmonary parenchymal masses are observed. Thoracic aortic atherosclerosis. Electronically Signed   By: David  Martinique M.D.   On: 09/21/2017 09:46    Procedures Procedures (including critical care time)  Medications Ordered in ED Medications  albuterol (PROVENTIL) (2.5 MG/3ML) 0.083% nebulizer solution (not administered)  albuterol (PROVENTIL) (2.5 MG/3ML) 0.083% nebulizer solution 5 mg (5 mg Nebulization Given 09/21/17 0909)     Initial Impression / Assessment and Plan / ED Course  I have reviewed the triage vital signs and the nursing notes.  Pertinent labs & imaging results that were available during my care of the patient were reviewed by me and considered in my medical decision making (see chart for details).  Iv ns. Continuous pulse ox and monitor.  Remains in sinus rhythm, rare pvc.   Labs.  Reviewed nursing notes and prior charts for additional history.   Patient notes resolution symptoms w earlier neb.   Pt feels breathing at baseline. Remains in sinus rhythm.   Pt appears stable for d/c.     Final Clinical Impressions(s) / ED Diagnoses  Final diagnoses:  None    New Prescriptions New Prescriptions   No medications on file     Lajean Saver, MD 09/21/17 1258

## 2017-09-21 NOTE — Assessment & Plan Note (Deleted)
Recurrent right breast triple negative cancer: Originally diagnosed with Hodgkin lymphoma in March 07, 1986 treated with mantle radiation. He developed breast cancer in 07-Mar-2008 underwent right breast mastectomy for a T1 B. N0 M0 stage IA triple negative breast cancer. In 03/07/12, patient developed chest wall recurrence that was resected. She is currently being followed by mammograms every year and breast MRIs every other year.  Breast Cancer Surveillance: 1. Breast exam 10/18/2016: Normal, right breast reconstructed, left breast no palpable lumps or nodules 2. Mammogram: Done at [facility] normal. We'll get an MRI next year.  S/P Postsplenectomy.  Hodgkin's lymphoma: Diagnosed 28 years ago and is in remission after mantle radiation  patient's husband died in 2015/03/08 from lung cancer. Return to clinic in 1 year for follow-up and survivorship clinic

## 2017-09-21 NOTE — ED Triage Notes (Signed)
Pt to ER for evaluation of shortness of breath sudden onset this am at 3 am, woke patient up from sleep. States hx of this in the past, was related to fluid overload. Reports compliance with medications. Pt is tachypneic, wheezing noted in upper lobes. A/o x4.

## 2017-09-21 NOTE — Discharge Instructions (Signed)
It was our pleasure to provide your ER care today - we hope that you feel better.  Use albuterol inhaler as need.   Follow up with primary care doctor in the next few days.   Return to ER if worse, new symptoms, fevers, chest pain, trouble breathing, other concern.

## 2017-09-25 ENCOUNTER — Ambulatory Visit (HOSPITAL_BASED_OUTPATIENT_CLINIC_OR_DEPARTMENT_OTHER): Payer: Medicare Other | Admitting: Hematology and Oncology

## 2017-09-25 ENCOUNTER — Telehealth: Payer: Self-pay | Admitting: Hematology and Oncology

## 2017-09-25 DIAGNOSIS — Z8571 Personal history of Hodgkin lymphoma: Secondary | ICD-10-CM

## 2017-09-25 DIAGNOSIS — Z853 Personal history of malignant neoplasm of breast: Secondary | ICD-10-CM

## 2017-09-25 DIAGNOSIS — Z170 Estrogen receptor positive status [ER+]: Principal | ICD-10-CM

## 2017-09-25 DIAGNOSIS — C50911 Malignant neoplasm of unspecified site of right female breast: Secondary | ICD-10-CM

## 2017-09-25 NOTE — Assessment & Plan Note (Signed)
Recurrent right breast triple negative cancer: Originally diagnosed with Hodgkin lymphoma in 03/06/86 treated with mantle radiation. He developed breast cancer in 03/06/2008 underwent right breast mastectomy for a T1 B. N0 M0 stage IA triple negative breast cancer. In 2012/03/06, patient developed chest wall recurrence that was resected. She is currently being followed by mammograms every year and breast MRIs every other year.  Breast Cancer Surveillance: 1. Breast exam 0/07/2017: Normal, right breast reconstructed, left breast no palpable lumps or nodules 2. Mammogram June 2018: Done at Dr. Alan Ripper office  3. MRI breast: Benign  S/P Postsplenectomy.  Hodgkin's lymphoma: Diagnosed 28 years ago and is in remission after mantle radiation  patient's husband died in Mar 07, 2015 from lung cancer. Return to clinic in 1 year for follow-up.

## 2017-09-25 NOTE — Telephone Encounter (Signed)
Gave patient avs report and appointments for October 2019.

## 2017-09-25 NOTE — Progress Notes (Signed)
Patient Care Team: [name], MD as PCP - General  DIAGNOSIS:  Encounter Diagnosis  Name Primary?  . Malignant neoplasm of right breast in female, estrogen receptor positive, unspecified site of breast ([place])     CHIEF COMPLIANT: breast cancer surveillance  INTERVAL HISTORY: EDIT [name] is a 71 year old with above-mentioned history of recurrent right breast cancer is currently on surveillance.She is here for annual follow-up and reports that her health is been fairly well except for recent atrial flutter and fibrillation for which she underwent ablation. Since then her heart has been doing quite well. She denies any lumps or nodules in the breast. MRI of the breast done in August 2018 was normal. She gets mammograms through Dr. Alan Ripper office.  REVIEW OF SYSTEMS:   Constitutional: Denies fevers, chills or abnormal weight loss Eyes: Denies blurriness of vision Ears, nose, mouth, throat, and face: Denies mucositis or sore throat Respiratory: Denies cough, dyspnea or wheezes Cardiovascular: Denies palpitation, chest discomfort Gastrointestinal:  Denies nausea, heartburn or change in bowel habits Skin: Denies abnormal skin rashes Lymphatics: Denies new lymphadenopathy or easy bruising Neurological:Denies numbness, tingling or new weaknesses Behavioral/Psych: Mood is stable, no new changes  Extremities: No lower extremity edema Breast:  denies any pain or lumps or nodules in either breasts All other systems were reviewed with the patient and are negative.  I have reviewed the past medical history, past surgical history, social history and family history with the patient and they are unchanged from previous note.  ALLERGIES:  is allergic to ace inhibitors; benazepril; epinephrine; potassium-containing compounds; prednisolone; and taxotere [docetaxel].  MEDICATIONS:  Current Outpatient Prescriptions  Medication Sig Dispense Refill  . acetaminophen (TYLENOL) 325 MG tablet Take  650 mg by mouth at bedtime as needed for moderate pain.    Marland Kitchen atorvastatin (LIPITOR) 10 MG tablet Take 1 tablet (10 mg total) by mouth daily at 6 PM. 30 tablet 11  . b complex vitamins capsule Take 1 capsule by mouth daily after breakfast.    . calcium carbonate (OS-CAL) 600 MG TABS Take 600 mg by mouth at bedtime.     . carvedilol (COREG) 12.5 MG tablet TAKE 1 TABLET(12.5 MG) BY MOUTH TWICE DAILY WITH A MEAL 60 tablet 10  . Cholecalciferol (VITAMIN D) 2000 UNITS CAPS Take 2,000 Units by mouth daily with lunch.     . dofetilide (TIKOSYN) 250 MCG capsule TAKE 1 CAPSULE(250 MCG) BY MOUTH EVERY 12 HOURS 180 capsule 3  . levothyroxine (SYNTHROID, LEVOTHROID) 100 MCG tablet Take 100 mcg by mouth daily at 6 (six) AM.     . losartan (COZAAR) 50 MG tablet Take 1 tablet (50 mg total) by mouth 2 (two) times daily. 180 tablet 3  . LUTEIN-ZEAXANTHIN PO Take 1 capsule by mouth daily after breakfast.     . magnesium oxide (MAG-OX 400) 400 MG tablet Take 400 mg by mouth 2 (two) times daily.     . Multiple Vitamin (MULTIVITAMIN) tablet Take 1 tablet by mouth daily after breakfast. "Natural Vitamin"    . OVER THE COUNTER MEDICATION Osteodenz Capsules: Take 1 capsule by mouth at bedtime    . OVER THE COUNTER MEDICATION Vegetarian Omega Green: Take 3 capsules by mouth daily with lunch    . rivaroxaban (XARELTO) 20 MG TABS tablet TAKE 1 TABLET BY MOUTH DAILY WITH DINNER 90 tablet 3   No current facility-administered medications for this visit.     PHYSICAL EXAMINATION: ECOG PERFORMANCE STATUS: 1 - Symptomatic but completely ambulatory  Vitals:  09/25/17 1333  BP: (!) 129/54  Pulse: 85  Resp: 18  Temp: 97.7 F (36.5 C)  SpO2: 98%   Filed Weights   09/25/17 1333  Weight: 136 lb 9.6 oz (62 kg)    GENERAL:alert, no distress and comfortable SKIN: skin color, texture, turgor are normal, no rashes or significant lesions EYES: normal, Conjunctiva are pink and non-injected, sclera clear OROPHARYNX:no  exudate, no erythema and lips, buccal mucosa, and tongue normal  NECK: supple, thyroid normal size, non-tender, without nodularity LYMPH:  no palpable lymphadenopathy in the cervical, axillary or inguinal LUNGS: clear to auscultation and percussion with normal breathing effort HEART: regular rate & rhythm and no murmurs and no lower extremity edema ABDOMEN:abdomen soft, non-tender and normal bowel sounds MUSCULOSKELETAL:no cyanosis of digits and no clubbing  NEURO: alert & oriented x 3 with fluent speech, no focal motor/sensory deficits EXTREMITIES: No lower extremity edema BREAST: No palpable masses or nodules in either right or left breasts. Right breast has been reconstructed is without any palpable abnormalities.No palpable axillary supraclavicular or infraclavicular adenopathy no breast tenderness or nipple discharge. (exam performed in the presence of a chaperone)  LABORATORY DATA:  I have reviewed the data as listed   Chemistry      Component Value Date/Time   NA 136 09/21/2017 0905   NA 141 06/20/2017 1244   NA 143 10/08/2015 1003   K 4.2 09/21/2017 0905   K 4.8 10/08/2015 1003   CL 103 09/21/2017 0905   CL 104 09/05/2012 1438   CO2 23 09/21/2017 0905   CO2 31 (H) 10/08/2015 1003   BUN 16 09/21/2017 0905   BUN 19 06/20/2017 1244   BUN 19.8 10/08/2015 1003   CREATININE 0.70 09/21/2017 0905   CREATININE 0.8 10/08/2015 1003      Component Value Date/Time   CALCIUM 9.0 09/21/2017 0905   CALCIUM 9.7 10/08/2015 1003   ALKPHOS 108 10/08/2015 1003   AST 17 10/08/2015 1003   ALT 16 10/08/2015 1003   BILITOT 0.39 10/08/2015 1003       Lab Results  Component Value Date   WBC 8.1 09/21/2017   HGB 13.0 09/21/2017   HCT 39.4 09/21/2017   MCV 91.4 09/21/2017   PLT 270 09/21/2017   NEUTROABS 5.2 06/20/2017    ASSESSMENT & PLAN:   Cancer of Breast T1bN0M0 triple neg S/P mastectomy/reconstruction 04/2008 Recurrent right breast triple negative cancer: Originally diagnosed  with Hodgkin lymphoma in 02/28/86 treated with mantle radiation. He developed breast cancer in February 29, 2008 underwent right breast mastectomy for a T1 B. N0 M0 stage IA triple negative breast cancer. In 2012/02/29, patient developed chest wall recurrence that was resected. She is currently being followed by mammograms every year and breast MRIs every other year.  Breast Cancer Surveillance: 1. Breast exam 0/07/2017: Normal, right breast reconstructed, left breast no palpable lumps or nodules 2. Mammogram June 2018: Done at Dr. Alan Ripper office  3. MRI breast: Benign  S/P Postsplenectomy.  Hodgkin's lymphoma: Diagnosed 28 years ago and is in remission after mantle radiation  patient's husband died in 2015-03-01 from lung cancer. Return to clinic in 1 year for follow-up.   I spent 25 minutes talking to the patient of which more than half was spent in counseling and coordination of care.  No orders of the defined types were placed in this encounter.  The patient has a good understanding of the overall plan. she agrees with it. she will call with any problems that may develop before the next  visit here.   Rulon Eisenmenger, MD 09/25/17

## 2017-09-27 DIAGNOSIS — Z23 Encounter for immunization: Secondary | ICD-10-CM | POA: Diagnosis not present

## 2017-09-27 DIAGNOSIS — J9801 Acute bronchospasm: Secondary | ICD-10-CM | POA: Diagnosis not present

## 2017-09-27 DIAGNOSIS — K219 Gastro-esophageal reflux disease without esophagitis: Secondary | ICD-10-CM | POA: Diagnosis not present

## 2017-10-03 DIAGNOSIS — Z853 Personal history of malignant neoplasm of breast: Secondary | ICD-10-CM | POA: Diagnosis not present

## 2017-10-03 DIAGNOSIS — E039 Hypothyroidism, unspecified: Secondary | ICD-10-CM | POA: Diagnosis not present

## 2017-10-03 DIAGNOSIS — I4891 Unspecified atrial fibrillation: Secondary | ICD-10-CM | POA: Diagnosis not present

## 2017-10-08 NOTE — Progress Notes (Signed)
Electrophysiology Office Note   Date:  10/09/2017   ID:  [name], [name] 11/16/1946, MRN [id number]  PCP:  Patricia Arabian, MD  Cardiologist:  [name] Primary Electrophysiologist:  Patricia Meredith Leeds, MD    Chief Complaint  Patient presents with  . Follow-up    PAF/post ablation     History of Present Illness: IZZIE [name] is a 71 y.o. female who is being seen today for the evaluation of atrial fibrillation at the request of Patricia Arabian, MD. Presenting today for electrophysiology evaluation. Has a history of nonischemic cardio myopathy with an EF that has since improved, diastolic heart failure, hypertension, left bundle branch block, hypothyroidism, Hodgkin's disease, breast cancer, and carotid stenosis.  Radiation therapy for Hodgkin's lymphoma in 1998, mastectomy in 2009 and recurrent breast cancer in 2013. EF 45% in the past which is since improved. Flecainide was started in 2013 for recurrent atrial fibrillation. Treadmill test was negative for arrhythmia. Admitted for pulmonary edema on 5/14 in the setting of hypertensive crisis compensated by PEA arrest. Currently on Tikosyn. Had AF ablation 06/30/17.  Today, denies symptoms of palpitations, chest pain, shortness of breath, orthopnea, PND, lower extremity edema, claudication, dizziness, presyncope, syncope, bleeding, or neurologic sequela. The patient is tolerating medications without difficulties. She is noted no further episodes of atrial fibrillation. She has not had palpitations, fatigue, or shortness of breath. She has been doing well without major complaint. She has had issues with reflux that has been helped by medications and raising the head of her bed.   Past Medical History:  Diagnosis Date  . Anemia   . Arthritis    fingers   . Blood transfusion    1968  . Breast cancer ([place]) 2009   T1N0M0 DCIS right breast  . Breast cancer ([place]) 2013   a. reocurrence right breast;  b. s/p Taxotere, Cytoxan and XRT    . Carotid stenosis    a. dopplers 3/47:  RICA 4-25%, LICA 95-63% => repeat due in 01/2013;  b.  Carotid US (8/75): RICA 6-43%; LICA 32-95% - f/u 6 mos  . Fracture of left patella 12/2016  . History of radiation therapy 07/10/12-08/13/12   right breast/chest wall  . Hodgkin's disease    stage 2b;  s/p XRT 1988  . HTN (hypertension)   . Hypothyroidism    2/2 XRT for Hodgkins Lymphoma  . LBBB (left bundle branch block)   . Mucositis 04/29/2012  . NICM (nonischemic cardiomyopathy) ([place])    a. EF as low as 45%;  b. LHC 5/05:  LM 10%, pLAD 30%, EF 45-50%,   c. Echo 5/13:  EF 60-65%, Gr 1 diast dysfn, MAC;   d.  Echo 11/13:  EF 60-65%, Gr 1 diast dysfn, Tr AI, mild MR;  d.  Echo 05/10/13: EF 18-84%, grade 1 diastolic dysfunction, PASP 36, small effusion.  Marland Kitchen PAF (paroxysmal atrial fibrillation) ([place])    a. dx in 2008;  b. CHADS2-VASc=5;  c. changed to [place] during admx 05/2013; d. Xarelto started 05/2013; e. s/p successful Afib ablation 06/30/2017  . PEA (Pulseless electrical activity) ([place])    a. admx 05/2013 for APE in setting of HTN emergency c/b PEA arrest and asp pneumonia, VDRF, AFib with RVR  . Raynaud's disease   . Status post radiation therapy 1988   mantle and periaortic    Past Surgical History:  Procedure Laterality Date  . ATRIAL FIBRILLATION ABLATION  06/30/2017  . ATRIAL FIBRILLATION ABLATION N/A 06/30/2017   Procedure: Atrial  Fibrillation Ablation;  Surgeon: Patricia Haw, MD;  Location: [facility];  Service: Cardiovascular;  Laterality: N/A;  . BREAST LUMPECTOMY  2009   right, lymph node biopsy  . BTL  1971  . DILATION AND CURETTAGE OF UTERUS    . IR RADIOLOGY PERIPHERAL GUIDED IV START  06/23/2017  . IR US GUIDE VASC ACCESS LEFT  06/23/2017  . MASS EXCISION  03/28/2012   Procedure: EXCISION MASS;  Surgeon: Patricia Jolly, MD;  Location: [facility];  Service: General;  Laterality: Right;  Excision right chest wall mass  . MASTECTOMY  2009   right  breast  . POLYPECTOMY  1990   D&C (also in 1977 and [place])   . PORT-A-CATH REMOVAL  10/03/2012   Procedure: REMOVAL PORT-A-CATH;  Surgeon: Patricia Jolly, MD;  Location: [facility];  Service: General;  Laterality: N/A;  . PORTACATH PLACEMENT  04/16/2012   Procedure: INSERTION PORT-A-CATH;  Surgeon: Patricia Jolly, MD;  Location: [facility];  Service: General;  Laterality: Left;  Placement of Port-a-Cath, left subclavian  . R vein ligation & stripping  1976  . Spleenectomy  1988   for Hodgkin's disease  . Thoractomy  1968  . TISSUE EXPANDER  REMOVAL W/ REPLACEMENT OF IMPLANT    . TISSUE EXPANDER PLACEMENT     right breast  . TONSILLECTOMY AND ADENOIDECTOMY  1965     Current Outpatient Prescriptions  Medication Sig Dispense Refill  . acetaminophen (TYLENOL) 325 MG tablet Take 650 mg by mouth at bedtime as needed for moderate pain.    Marland Kitchen atorvastatin (LIPITOR) 10 MG tablet Take 1 tablet (10 mg total) by mouth daily at 6 PM. 30 tablet 11  . b complex vitamins capsule Take 1 capsule by mouth daily after breakfast.    . calcium carbonate (OS-CAL) 600 MG TABS Take 600 mg by mouth at bedtime.     . carvedilol (COREG) 12.5 MG tablet TAKE 1 TABLET(12.5 MG) BY MOUTH TWICE DAILY WITH A MEAL 60 tablet 10  . Cholecalciferol (VITAMIN D) 2000 UNITS CAPS Take 2,000 Units by mouth daily with lunch.     . dofetilide (TIKOSYN) 250 MCG capsule TAKE 1 CAPSULE(250 MCG) BY MOUTH EVERY 12 HOURS 180 capsule 3  . levothyroxine (SYNTHROID, LEVOTHROID) 100 MCG tablet Take 100 mcg by mouth daily at 6 (six) AM.     . LUTEIN-ZEAXANTHIN PO Take 1 capsule by mouth daily after breakfast.     . magnesium oxide (MAG-OX 400) 400 MG tablet Take 400 mg by mouth 2 (two) times daily.     . Multiple Vitamin (MULTIVITAMIN) tablet Take 1 tablet by mouth daily after breakfast. "Natural Vitamin"    . OVER THE COUNTER MEDICATION Osteodenz Capsules: Take 1 capsule by mouth at bedtime    . OVER THE COUNTER MEDICATION Vegetarian Omega  Green: Take 3 capsules by mouth daily with lunch    . ranitidine (ZANTAC) 150 MG tablet Take 150 mg by mouth daily.    . rivaroxaban (XARELTO) 20 MG TABS tablet TAKE 1 TABLET BY MOUTH DAILY WITH DINNER 90 tablet 3  . losartan (COZAAR) 50 MG tablet Take 1 tablet (50 mg total) by mouth 2 (two) times daily. 180 tablet 3   No current facility-administered medications for this visit.     Allergies:   Ace inhibitors; Benazepril; Epinephrine; Potassium-containing compounds; Prednisolone; and Taxotere [docetaxel]   Social History:  The patient  reports that she has never smoked. She has never used smokeless  tobacco. She reports that she does not drink alcohol or use drugs.   Family History:  The patient's family history includes Hypertension in her mother.    ROS:  Please see the history of present illness.   Otherwise, review of systems is positive for none.   All other systems are reviewed and negative.   PHYSICAL EXAM: VS:  BP 136/82   Pulse 78   Ht _0  (1.575 m)   Wt 137 lb 6.4 oz (62.3 kg)   LMP 03/08/1995   SpO2 97%   BMI 25.13 kg/m  , BMI Body mass index is 25.13 kg/m. GEN: Well nourished, well developed, in no acute distress  HEENT: normal  Neck: no JVD, carotid bruits, or masses Cardiac: RRR; no murmurs, rubs, or gallops,no edema  Respiratory:  clear to auscultation bilaterally, normal work of breathing GI: soft, nontender, nondistended, + BS MS: no deformity or atrophy  Skin: warm and dry Neuro:  Strength and sensation are intact Psych: euthymic mood, full affect  EKG:  EKG is ordered today. Personal review of the ekg ordered shows SR, rate 78, LAFB, IVCD  Recent Labs: 04/21/2017: Magnesium 2.2; TSH 1.990 09/21/2017: BUN 16; Creatinine, Ser 0.70; Hemoglobin 13.0; Platelets 270; Potassium 4.2; Sodium 136    Lipid Panel     Component Value Date/Time   CHOL 163 05/18/2013 0440   TRIG 175 (H) 05/18/2013 0440   HDL 26 (L) 05/18/2013 0440   CHOLHDL 6.3 05/18/2013 0440     VLDL 35 05/18/2013 0440   LDLCALC 102 (H) 05/18/2013 0440     Wt Readings from Last 3 Encounters:  10/09/17 137 lb 6.4 oz (62.3 kg)  09/25/17 136 lb 9.6 oz (62 kg)  08/02/17 137 lb 3.2 oz (62.2 kg)      Other studies Reviewed: Additional studies/ records that were reviewed today include: TTE 05/05/17, SPECT 05/05/17  Review of the above records today demonstrates:  - Left ventricle: The cavity size was normal. There was mild   concentric hypertrophy. Systolic function was normal. The   estimated ejection fraction was in the range of 55% to 60%. Wall   motion was normal; there were no regional wall motion   abnormalities. Features are consistent with a pseudonormal left   ventricular filling pattern, with concomitant abnormal relaxation   and increased filling pressure (grade 2 diastolic dysfunction).   Doppler parameters are consistent with high ventricular filling   pressure. - Aortic valve: Poorly visualized. Trileaflet; mildly thickened,   mildly calcified leaflets. There was trivial regurgitation. - Mitral valve: There was moderate regurgitation. - Left atrium: The atrium was mildly dilated. - Tricuspid valve: There was mild regurgitation. - Pulmonary arteries: PA peak pressure: 39 mm Hg (S). - Impressions: The right ventricular systolic pressure was   increased consistent with mild pulmonary hypertension.    Nuclear stress EF: 58%.  Defect 1: There is a medium defect of mild severity present in the basal anteroseptal and mid anteroseptal location.  This is a low risk study.  The left ventricular ejection fraction is normal (55-65%).   Low risk stress nuclear study with fixed septal defect likely related to LBBB; no ischemia; EF 58 with mild septal hypokinesis.  ASSESSMENT AND PLAN:  1.  Paroxysmal atrial fibrillation: On Tikosyn, coreg, Xarelto. Had AF ablation 06/30/17. Doing well post ablation. No further episodes of atrial fibrillation. Continue current  management.  This patients CHA2DS2-VASc Score and unadjusted Ischemic Stroke Rate (% per year) is equal to 3.2 % stroke  rate/year from a score of 3  Above score calculated as 1 point each if present [CHF, HTN, DM, Vascular=MI/PAD/Aortic Plaque, Age if 65-74, or Female] Above score calculated as 2 points each if present [Age > 75, or Stroke/TIA/TE]   2. Chronic diastolic heart failure: Blood pressure well controlled today. No signs of volume overload. No changes.  3. Hypertension: Blood pressure better controlled today. No changes.  Current medicines are reviewed at length with the patient today.   The patient does not have concerns regarding her medicines.  The following changes were made today:  None  Labs/ tests ordered today include:  Orders Placed This Encounter  Procedures  . Magnesium  . Basic Metabolic Panel (BMET)  . EKG 12-Lead     Disposition:   FU with Patricia Camnitz 3 months  Signed, Patricia Meredith Leeds, MD  10/09/2017 11:00 AM     [facility] [address] [place] [address] [phone] (office) [phone] (fax)

## 2017-10-09 ENCOUNTER — Telehealth: Payer: Self-pay | Admitting: Cardiology

## 2017-10-09 ENCOUNTER — Ambulatory Visit (INDEPENDENT_AMBULATORY_CARE_PROVIDER_SITE_OTHER): Payer: Medicare Other | Admitting: Cardiology

## 2017-10-09 ENCOUNTER — Encounter: Payer: Self-pay | Admitting: Cardiology

## 2017-10-09 VITALS — BP 136/82 | HR 78 | Ht 62.0 in | Wt 137.4 lb

## 2017-10-09 DIAGNOSIS — I10 Essential (primary) hypertension: Secondary | ICD-10-CM

## 2017-10-09 DIAGNOSIS — I480 Paroxysmal atrial fibrillation: Secondary | ICD-10-CM

## 2017-10-09 DIAGNOSIS — Z79899 Other long term (current) drug therapy: Secondary | ICD-10-CM

## 2017-10-09 DIAGNOSIS — I5032 Chronic diastolic (congestive) heart failure: Secondary | ICD-10-CM

## 2017-10-09 DIAGNOSIS — I6523 Occlusion and stenosis of bilateral carotid arteries: Secondary | ICD-10-CM

## 2017-10-09 NOTE — Patient Instructions (Addendum)
Medication Instructions:  Your physician recommends that you continue on your current medications as directed. Please refer to the Current Medication list given to you today.  Labwork: Tikosyn surveillance lab work today: Magnesium & BMET  Testing/Procedures: None ordered  Follow-Up: Your physician recommends that you schedule a follow-up appointment in: 3 months with Dr. Curt Bears.  -- If you need a refill on your cardiac medications before your next appointment, please call your pharmacy. --  Thank you for choosing [facility]!!   [name], RN [phone]

## 2017-10-09 NOTE — Addendum Note (Signed)
Addended by: Stanton Kidney on: 10/09/2017 02:19 PM   Modules accepted: Orders

## 2017-10-09 NOTE — Telephone Encounter (Signed)
Appreciated pt calling to let me know.  Advised she is fine to stop by office tomorrow for blood work. She appreciates our understanding and agreeable to plan.

## 2017-10-09 NOTE — Telephone Encounter (Signed)
Follow Up:   Pt said she forgot to get her lab when she was here today. She wants to know if she can come tomorrow?

## 2017-10-10 ENCOUNTER — Other Ambulatory Visit: Payer: Medicare Other

## 2017-10-11 ENCOUNTER — Other Ambulatory Visit: Payer: Medicare Other | Admitting: *Deleted

## 2017-10-11 DIAGNOSIS — Z79899 Other long term (current) drug therapy: Secondary | ICD-10-CM | POA: Diagnosis not present

## 2017-10-11 DIAGNOSIS — I480 Paroxysmal atrial fibrillation: Secondary | ICD-10-CM | POA: Diagnosis not present

## 2017-10-12 LAB — BASIC METABOLIC PANEL
BUN / CREAT RATIO: 28 (ref 12–28)
BUN: 22 mg/dL (ref 8–27)
CALCIUM: 9.5 mg/dL (ref 8.7–10.3)
CO2: 27 mmol/L (ref 20–29)
CREATININE: 0.78 mg/dL (ref 0.57–1.00)
Chloride: 101 mmol/L (ref 96–106)
GFR, EST AFRICAN AMERICAN: 88 mL/min/{1.73_m2} (ref >59)
GFR, EST NON AFRICAN AMERICAN: 76 mL/min/{1.73_m2} (ref >59)
GLUCOSE: 93 mg/dL (ref 65–99)
Potassium: 4.9 mmol/L (ref 3.5–5.2)
Sodium: 141 mmol/L (ref 134–144)

## 2017-10-12 LAB — MAGNESIUM: Magnesium: 2.3 mg/dL (ref 1.6–2.3)

## 2017-11-10 ENCOUNTER — Other Ambulatory Visit: Payer: Self-pay | Admitting: Cardiovascular Disease

## 2017-12-15 ENCOUNTER — Ambulatory Visit (HOSPITAL_COMMUNITY)
Admission: RE | Admit: 2017-12-15 | Discharge: 2017-12-15 | Disposition: A | Discharge status: Home / Self Care | Payer: Medicare Other | Source: Ambulatory Visit | Attending: Cardiovascular Disease | Admitting: Cardiovascular Disease

## 2017-12-15 DIAGNOSIS — I779 Disorder of arteries and arterioles, unspecified: Secondary | ICD-10-CM | POA: Diagnosis not present

## 2017-12-15 DIAGNOSIS — I739 Peripheral vascular disease, unspecified: Secondary | ICD-10-CM

## 2017-12-15 DIAGNOSIS — I6523 Occlusion and stenosis of bilateral carotid arteries: Secondary | ICD-10-CM | POA: Insufficient documentation

## 2017-12-21 NOTE — Progress Notes (Signed)
Patient ID: [name], female   DOB: 08/24/1946, 72 y.o.   MRN: [id number]    Cardiology Office Note   Date:  12/28/2017   ID:  [name], [name] May 18, 1946, MRN [id number]  PCP:  [name], MD  Cardiologist:  Dr. Jenkins Rouge   Electrophysiologist:  n/a  No chief complaint on file.    History of Present Illness: [name] is a 72 y.o. female with a hx of NICM with improved EF, diastolic HF, HTN, LBBB, hypothyroidism, Hodgkin's disease, breast CA, PAF, carotid stenosis. Patient underwent radiation therapy for Hodgkin's lymphoma in 1998, mastectomy for breast cancer in 2009 and recurrent breast CA in 2013. EF has been as low as 45% in the past. This is improved to normal over time. Flecainide was started in 12/13 for recurrent A. fib. ETT was negative for proarrhythmia. She was admitted in 5/14 with pulmonary edema in the setting of hypertensive crisis complicated by PEA arrest. She was switched to dofetilide. For recurrent PAF  In May 2018 had more breakthrough PAF documented by monitor Myovue 05/05/17 no ischemia EF 58% Echo 05/05/17 EF 55-60% moderate MR mild LAE Seen by Dr Curt Bears and ablation done 06/30/17    Studies/Reports Reviewed Today:  Notes afib ablation Camnitz 06/30/17 Carotid Duplex 97/94/80 16-55% LICA stenosis.  F/U duplex in one year    Past Medical History:  Diagnosis Date  . Anemia   . Arthritis    fingers   . Blood transfusion    1968  . Breast cancer ([place]) 2009   T1N0M0 DCIS right breast  . Breast cancer ([place]) 2013   a. reocurrence right breast;  b. s/p Taxotere, Cytoxan and XRT  . Carotid stenosis    a. dopplers 3/74:  RICA 8-27%, LICA 07-86% => repeat due in 01/2013;  b.  Carotid US (7/54): RICA 4-92%; LICA 01-00% - f/u 6 mos  . Fracture of left patella 12/2016  . History of radiation therapy 07/10/12-08/13/12   right breast/chest wall  . Hodgkin's disease    stage 2b;  s/p XRT 1988  . HTN (hypertension)   . Hypothyroidism    2/2  XRT for Hodgkins Lymphoma  . LBBB (left bundle branch block)   . Mucositis 04/29/2012  . NICM (nonischemic cardiomyopathy) ([place])    a. EF as low as 45%;  b. LHC 5/05:  LM 10%, pLAD 30%, EF 45-50%,   c. Echo 5/13:  EF 60-65%, Gr 1 diast dysfn, MAC;   d.  Echo 11/13:  EF 60-65%, Gr 1 diast dysfn, Tr AI, mild MR;  d.  Echo 05/10/13: EF 71-21%, grade 1 diastolic dysfunction, PASP 36, small effusion.  Marland Kitchen PAF (paroxysmal atrial fibrillation) ([place])    a. dx in 2008;  b. CHADS2-VASc=5;  c. changed to [place] during admx 05/2013; d. Xarelto started 05/2013; e. s/p successful Afib ablation 06/30/2017  . PEA (Pulseless electrical activity) ([place])    a. admx 05/2013 for APE in setting of HTN emergency c/b PEA arrest and asp pneumonia, VDRF, AFib with RVR  . Raynaud's disease   . Status post radiation therapy 1988   mantle and periaortic     Past Surgical History:  Procedure Laterality Date  . ATRIAL FIBRILLATION ABLATION  06/30/2017  . ATRIAL FIBRILLATION ABLATION N/A 06/30/2017   Procedure: Atrial Fibrillation Ablation;  Surgeon: Constance Haw, MD;  Location: [facility];  Service: Cardiovascular;  Laterality: N/A;  . BREAST LUMPECTOMY  2009   right, lymph node biopsy  .  BTL  1971  . DILATION AND CURETTAGE OF UTERUS    . IR RADIOLOGY PERIPHERAL GUIDED IV START  06/23/2017  . IR US GUIDE VASC ACCESS LEFT  06/23/2017  . MASS EXCISION  03/28/2012   Procedure: EXCISION MASS;  Surgeon: Edward Jolly, MD;  Location: [facility];  Service: General;  Laterality: Right;  Excision right chest wall mass  . MASTECTOMY  2009   right breast  . POLYPECTOMY  1990   D&C (also in 1977 and [place])   . PORT-A-CATH REMOVAL  10/03/2012   Procedure: REMOVAL PORT-A-CATH;  Surgeon: Edward Jolly, MD;  [facility];  Service: General;  Laterality: N/A;  . PORTACATH PLACEMENT  04/16/2012   Procedure: INSERTION PORT-A-CATH;  Surgeon: Edward Jolly, MD;  [facility];  Service:  General;  Laterality: Left;  Placement of Port-a-Cath, left subclavian  . R vein ligation & stripping  1976  . Spleenectomy  1988   for Hodgkin's disease  . Thoractomy  1968  . TISSUE EXPANDER  REMOVAL W/ REPLACEMENT OF IMPLANT    . TISSUE EXPANDER PLACEMENT     right breast  . TONSILLECTOMY AND ADENOIDECTOMY  1965     Current Outpatient Medications  Medication Sig Dispense Refill  . acetaminophen (TYLENOL) 325 MG tablet Take 650 mg by mouth at bedtime as needed for moderate pain.    Marland Kitchen atorvastatin (LIPITOR) 10 MG tablet Take 1 tablet (10 mg total) by mouth daily at 6 PM. 30 tablet 11  . b complex vitamins capsule Take 1 capsule by mouth daily after breakfast.    . calcium carbonate (OS-CAL) 600 MG TABS Take 600 mg by mouth at bedtime.     . carvedilol (COREG) 12.5 MG tablet TAKE 1 TABLET(12.5 MG) BY MOUTH TWICE DAILY WITH A MEAL 60 tablet 10  . Cholecalciferol (VITAMIN D) 2000 UNITS CAPS Take 2,000 Units by mouth daily with lunch.     . dofetilide (TIKOSYN) 250 MCG capsule TAKE 1 CAPSULE(250 MCG) BY MOUTH EVERY 12 HOURS 180 capsule 3  . levothyroxine (SYNTHROID, LEVOTHROID) 100 MCG tablet Take 100 mcg by mouth daily at 6 (six) AM.     . LUTEIN-ZEAXANTHIN PO Take 1 capsule by mouth daily after breakfast.     . magnesium oxide (MAG-OX 400) 400 MG tablet Take 400 mg by mouth 2 (two) times daily.     . Multiple Vitamin (MULTIVITAMIN) tablet Take 1 tablet by mouth daily after breakfast. "Natural Vitamin"    . OVER THE COUNTER MEDICATION Osteodenz Capsules: Take 1 capsule by mouth at bedtime    . OVER THE COUNTER MEDICATION Vegetarian Omega Green: Take 3 capsules by mouth daily with lunch    . rivaroxaban (XARELTO) 20 MG TABS tablet TAKE 1 TABLET BY MOUTH DAILY WITH DINNER 90 tablet 3  . losartan (COZAAR) 50 MG tablet Take 1 tablet (50 mg total) by mouth 2 (two) times daily. 180 tablet 3   No current facility-administered medications for this visit.     Allergies:   Ace inhibitors;  Benazepril; Epinephrine; Potassium-containing compounds; Prednisolone; and Taxotere [docetaxel]    Social History:  The patient  reports that  has never smoked. she has never used smokeless tobacco. She reports that she does not drink alcohol or use drugs.   Family History:  The patient's family history includes Hypertension in her mother.    ROS:   Please see the history of present illness.   Review of Systems  All other systems  reviewed and are negative.     PHYSICAL EXAM: VS:  BP (!) 154/88   Pulse 76   Ht 5' 2"  (1.575 m)   Wt 136 lb 4 oz (61.8 kg)   LMP 03/08/1995   SpO2 98%   BMI 24.92 kg/m     Wt Readings from Last 3 Encounters:  12/28/17 136 lb 4 oz (61.8 kg)  10/09/17 137 lb 6.4 oz (62.3 kg)  09/25/17 136 lb 9.6 oz (62 kg)     Affect appropriate Healthy:  appears stated age HEENT: normal Neck supple with no adenopathy JVP normal left carotid  bruits no thyromegaly Lungs clear with no wheezing and good diaphragmatic motion Heart:  S1/S2 no murmur, no rub, gallop or click PMI normal Abdomen: benighn, BS positve, no tenderness, no AAA no bruit.  No HSM or HJR Distal pulses intact with no bruits No edema Neuro non-focal Skin warm and dry No muscular weakness     EKG:    10/09/17 SR ICLBBB QT 420    Recent Labs: 04/21/2017: TSH 1.990 09/21/2017: Hemoglobin 13.0; Platelets 270 10/11/2017: BUN 22; Creatinine, Ser 0.78; Magnesium 2.3; Potassium 4.9; Sodium 141    Lipid Panel    Component Value Date/Time   CHOL 163 05/18/2013 0440   TRIG 175 (H) 05/18/2013 0440   HDL 26 (L) 05/18/2013 0440   CHOLHDL 6.3 05/18/2013 0440   VLDL 35 05/18/2013 0440   LDLCALC 102 (H) 05/18/2013 0440      ASSESSMENT AND PLAN:  1. Chronic Diastolic CHF:  Volume stable.  She is back on beta-blocker, angiotensin receptor blocker   2. PAF:  Break through PAF on low dose Tikosyn Long QT with higher dose Now post ablation with Dr Curt Bears Done 06/30/17 still on low dose  tikosyn CHA2VASC 3 continue xarelto  3. HTN:  Well controlled.  Continue current medications and low sodium Dash type diet.    4. Hypothyroidism: FU with PCP.  TSH 1.26 Apr 2017   5. Carotid stenosis:   48-88% LICA 91/69/45 f/u dupelx January 2020   6. Breast Cancer in remission f/u oncology  3D MRI in July     Disposition:    Ablation in July and f/u with me in 6 months She will continue her xarelto up until time of procedure And not hold it per [facility]   Jenkins Rouge

## 2017-12-22 ENCOUNTER — Telehealth: Payer: Self-pay | Admitting: Cardiovascular Disease

## 2017-12-22 DIAGNOSIS — I6522 Occlusion and stenosis of left carotid artery: Secondary | ICD-10-CM

## 2017-12-22 NOTE — Telephone Encounter (Signed)
Patient aware of carotid results. Per Dr. Johnsie Cancel, 16-55% LICA stenosis.  F/U duplex in one year. Patient verbalized understanding. Will put in order for test and recall.

## 2017-12-22 NOTE — Telephone Encounter (Signed)
New Message   Patient is calling stating that she received a call but the only thing it said was to call office. She is thinking is in reference to her carotid test. Please call.

## 2017-12-28 ENCOUNTER — Encounter: Payer: Self-pay | Admitting: Cardiovascular Disease

## 2017-12-28 ENCOUNTER — Ambulatory Visit (INDEPENDENT_AMBULATORY_CARE_PROVIDER_SITE_OTHER): Payer: Medicare Other | Admitting: Cardiovascular Disease

## 2017-12-28 VITALS — BP 154/88 | HR 76 | Ht 62.0 in | Wt 136.2 lb

## 2017-12-28 DIAGNOSIS — I5032 Chronic diastolic (congestive) heart failure: Secondary | ICD-10-CM

## 2017-12-28 DIAGNOSIS — I6523 Occlusion and stenosis of bilateral carotid arteries: Secondary | ICD-10-CM

## 2017-12-28 DIAGNOSIS — I10 Essential (primary) hypertension: Secondary | ICD-10-CM

## 2017-12-28 DIAGNOSIS — I480 Paroxysmal atrial fibrillation: Secondary | ICD-10-CM | POA: Diagnosis not present

## 2017-12-28 NOTE — Patient Instructions (Addendum)

## 2017-12-29 DIAGNOSIS — I429 Cardiomyopathy, unspecified: Secondary | ICD-10-CM | POA: Diagnosis not present

## 2017-12-29 DIAGNOSIS — E039 Hypothyroidism, unspecified: Secondary | ICD-10-CM | POA: Diagnosis not present

## 2017-12-29 DIAGNOSIS — Z1211 Encounter for screening for malignant neoplasm of colon: Secondary | ICD-10-CM | POA: Diagnosis not present

## 2017-12-29 DIAGNOSIS — I4891 Unspecified atrial fibrillation: Secondary | ICD-10-CM | POA: Diagnosis not present

## 2017-12-29 DIAGNOSIS — Z1389 Encounter for screening for other disorder: Secondary | ICD-10-CM | POA: Diagnosis not present

## 2017-12-29 DIAGNOSIS — M8580 Other specified disorders of bone density and structure, unspecified site: Secondary | ICD-10-CM | POA: Diagnosis not present

## 2017-12-29 DIAGNOSIS — E7800 Pure hypercholesterolemia, unspecified: Secondary | ICD-10-CM | POA: Diagnosis not present

## 2017-12-29 DIAGNOSIS — M6281 Muscle weakness (generalized): Secondary | ICD-10-CM | POA: Diagnosis not present

## 2018-01-03 ENCOUNTER — Other Ambulatory Visit: Payer: Self-pay | Admitting: Cardiovascular Disease

## 2018-01-04 DIAGNOSIS — M542 Cervicalgia: Secondary | ICD-10-CM | POA: Diagnosis not present

## 2018-01-11 DIAGNOSIS — M542 Cervicalgia: Secondary | ICD-10-CM | POA: Diagnosis not present

## 2018-01-15 ENCOUNTER — Telehealth: Payer: Self-pay | Admitting: Cardiovascular Disease

## 2018-01-15 NOTE — Telephone Encounter (Signed)
Request for surgical clearance:  1. What type of surgery is being performed?  Colonoscopy   2. When is this surgery scheduled?  03/06/18   3. Are there any medications that need to be held prior to surgery and how long? Xarelto   4. Name of physician performing surgery?  Dr. Michail Sermon   5. What is your office phone and fax number?  Phone [phone]  Fax [phone] [facility]

## 2018-01-16 NOTE — Telephone Encounter (Signed)
Patient with diagnosis of atrial fibrillation on Xarelto for anticoagulation.    Procedure: colonoscopy Date of procedure: 03/06/18  CHADS2-VASc score of  5 (CHF, HTN, AGE,CAD, female)  CrCl 68.9 Platelet count 270  Per office protocol, patient can hold Xarelto for 2 days prior to procedure.    Patient will not need bridging with Lovenox (enoxaparin) around procedure.  Patient should restart Xarelto on the evening of procedure or day after, at discretion of procedure MD

## 2018-01-17 NOTE — Telephone Encounter (Signed)
  Pre-operative Risk Assessment - Provider Statement    Patient ID:  [name], DOB: 03/24/1946, MRN: [id number]   Ms. Alfredo's chart has been reviewed for pre-operative risk assessment.  Per CVRR, patient may hold Xarelto x 2 days prior to procedure.  RCRI is 0.9%.  She is at low risk and is ok to proceed without further testing as long as she has not had any changes since last seen.  Patient last seen by Dr. Jenkins Rouge 12/28/17.  Message left for her to call back to make sure there have been no changes since last seen.    Signed,  Richardson Dopp, PA-C  01/17/2018 4:39 PM

## 2018-01-18 ENCOUNTER — Ambulatory Visit (INDEPENDENT_AMBULATORY_CARE_PROVIDER_SITE_OTHER): Payer: Medicare Other | Admitting: Cardiology

## 2018-01-18 ENCOUNTER — Encounter: Payer: Self-pay | Admitting: Cardiology

## 2018-01-18 VITALS — BP 156/78 | HR 81 | Ht 62.0 in | Wt 139.0 lb

## 2018-01-18 DIAGNOSIS — I10 Essential (primary) hypertension: Secondary | ICD-10-CM

## 2018-01-18 DIAGNOSIS — I493 Ventricular premature depolarization: Secondary | ICD-10-CM

## 2018-01-18 DIAGNOSIS — I5032 Chronic diastolic (congestive) heart failure: Secondary | ICD-10-CM | POA: Diagnosis not present

## 2018-01-18 DIAGNOSIS — I6523 Occlusion and stenosis of bilateral carotid arteries: Secondary | ICD-10-CM

## 2018-01-18 DIAGNOSIS — I480 Paroxysmal atrial fibrillation: Secondary | ICD-10-CM | POA: Diagnosis not present

## 2018-01-18 DIAGNOSIS — M542 Cervicalgia: Secondary | ICD-10-CM | POA: Diagnosis not present

## 2018-01-18 NOTE — Patient Instructions (Addendum)
Medication Instructions:  Your physician recommends that you continue on your current medications as directed. Please refer to the Current Medication list given to you today.  * If you need a refill on your cardiac medications before your next appointment, please call your pharmacy. *  Labwork: None ordered  Testing/Procedures: Your physician has recommended that you wear a 48 hour holter monitor. Holter monitors are medical devices that record the heart's electrical activity. Doctors most often use these monitors to diagnose arrhythmias. Arrhythmias are problems with the speed or rhythm of the heartbeat. The monitor is a small, portable device. You can wear one while you do your normal daily activities. This is usually used to diagnose what is causing palpitations/syncope (passing out).  Follow-Up: Your physician wants you to follow-up in: 6 months with Dr. Curt Bears - unless we find something on the holter monitor then we will arrange sooner follow up.  You will receive a reminder letter in the mail two months in advance. If you don't receive a letter, please call our office to schedule the follow-up appointment.  Thank you for choosing [facility]!!   [name], RN [phone]  Any Other Special Instructions Will Be Listed Below (If Applicable).   Cardiac Event Monitoring A cardiac event monitor is a small recording device that is used to detect abnormal heart rhythms (arrhythmias). The monitor is used to record your heart rhythm when you have symptoms, such as:  Fast heartbeats (palpitations), such as heart racing or fluttering.  Dizziness.  Fainting or light-headedness.  Unexplained weakness.  Some monitors are wired to electrodes placed on your chest. Electrodes are flat, sticky disks that attach to your skin. Other monitors may be hand-held or worn on the wrist. The monitor can be worn for up to 30 days. If the monitor is attached to your chest, a technician will  prepare your chest for the electrode placement and show you how to work the monitor. Take time to practice using the monitor before you leave the office. Make sure you understand how to send the information from the monitor to your health care provider. In some cases, you may need to use a landline telephone instead of a cell phone. What are the risks? Generally, this device is safe to use, but it possible that the skin under the electrodes will become irritated. How to use your cardiac event monitor  Wear your monitor at all times, except when you are in water: ? Do not let the monitor get wet. ? Take the monitor off when you bathe. Do not swim or use a hot tub with it on.  Keep your skin clean. Do not put body lotion or moisturizer on your chest.  Change the electrodes as told by your health care provider or any time they stop sticking to your skin. You may need to use medical tape to keep them on.  Try to put the electrodes in slightly different places on your chest to help prevent skin irritation. They must remain in the area under your left breast and in the upper right section of your chest.  Make sure the monitor is safely clipped to your clothing or in a location close to your body that your health care provider recommends.  Press the button to record as soon as you feel heart-related symptoms, such as: ? Dizziness. ? Weakness. ? Light-headedness. ? Palpitations. ? Thumping or pounding in your chest. ? Shortness of breath. ? Unexplained weakness.  Keep a diary of your  activities, such as walking, doing chores, and taking medicine. It is very important to note what you were doing when you pushed the button to record your symptoms. This will help your health care provider determine what might be contributing to your symptoms.  Send the recorded information as recommended by your health care provider. It may take some time for your health care provider to process the  results.  Change the batteries as told by your health care provider.  Keep electronic devices away from your monitor. This includes: ? Tablets. ? MP3 players. ? Cell phones.  While wearing your monitor you should avoid: ? Electric blankets. ? Armed forces operational officer. ? Electric toothbrushes. ? Microwave ovens. ? Magnets. ? Metal detectors. Get help right away if:  You have chest pain.  You have extreme difficulty breathing or shortness of breath.  You develop a very fast heartbeat that persists.  You develop dizziness that does not go away.  You faint or constantly feel like you are about to faint. Summary  A cardiac event monitor is a small recording device that is used to help detect abnormal heart rhythms (arrhythmias).  The monitor is used to record your heart rhythm when you have heart-related symptoms.  Make sure you understand how to send the information from the monitor to your health care provider.  It is important to press the button on the monitor when you have any heart-related symptoms.  Keep a diary of your activities, such as walking, doing chores, and taking medicine. It is very important to note what you were doing when you pushed the button to record your symptoms. This will help your health care provider learn what might be causing your symptoms. This information is not intended to replace advice given to you by your health care provider. Make sure you discuss any questions you have with your health care provider. Document Released: 09/13/2008 Document Revised: 11/19/2016 Document Reviewed: 11/19/2016 Elsevier Interactive Patient Education  2017 [employer].

## 2018-01-18 NOTE — Progress Notes (Signed)
Electrophysiology Office Note   Date:  01/18/2018   ID:  [name], [name] Aug 19, 1946, MRN [id number]  PCP:  Patricia Arabian, MD  Cardiologist:  [name] Primary Electrophysiologist:  Patricia Cummings Meredith Leeds, MD    Chief Complaint  Patient presents with  . Follow-up    PAF     History of Present Illness: [name] is a 72 y.o. female who is being seen today for the evaluation of atrial fibrillation at the request of Patricia Arabian, MD. Presenting today for electrophysiology evaluation. Has a history of nonischemic cardio myopathy with an EF that has since improved, diastolic heart failure, hypertension, left bundle branch block, hypothyroidism, Hodgkin's disease, breast cancer, and carotid stenosis.  Radiation therapy for Hodgkin's lymphoma in 1998, mastectomy in 2009 and recurrent breast cancer in 2013. EF 45% in the past which is since improved. Flecainide was started in 2013 for recurrent atrial fibrillation. Treadmill test was negative for arrhythmia. Admitted for pulmonary edema on 5/14 in the setting of hypertensive crisis compensated by PEA arrest. Currently on Tikosyn. Had AF ablation 06/30/17.  Today, denies symptoms of palpitations, chest pain, shortness of breath, orthopnea, PND, lower extremity edema, claudication, dizziness, presyncope, syncope, bleeding, or neurologic sequela. The patient is tolerating medications without difficulties.  She is currently feeling well overall.  She is having some fatigue today as her only complaint.  She took a nap today and has been able to exercise, but she does feel quite tired.  She is having quite a few PVCs on her EKG.  Past Medical History:  Diagnosis Date  . Anemia   . Arthritis    fingers   . Blood transfusion    1968  . Breast cancer ([place]) 2009   T1N0M0 DCIS right breast  . Breast cancer ([place]) 2013   a. reocurrence right breast;  b. s/p Taxotere, Cytoxan and XRT  . Carotid stenosis    a. dopplers 8/88:  RICA 2-80%, LICA  03-49% => repeat due in 01/2013;  b.  Carotid US (1/79): RICA 1-50%; LICA 56-97% - f/u 6 mos  . Fracture of left patella 12/2016  . History of radiation therapy 07/10/12-08/13/12   right breast/chest wall  . Hodgkin's disease    stage 2b;  s/p XRT 1988  . HTN (hypertension)   . Hypothyroidism    2/2 XRT for Hodgkins Lymphoma  . LBBB (left bundle branch block)   . Mucositis 04/29/2012  . NICM (nonischemic cardiomyopathy) ([place])    a. EF as low as 45%;  b. LHC 5/05:  LM 10%, pLAD 30%, EF 45-50%,   c. Echo 5/13:  EF 60-65%, Gr 1 diast dysfn, MAC;   d.  Echo 11/13:  EF 60-65%, Gr 1 diast dysfn, Tr AI, mild MR;  d.  Echo 05/10/13: EF 94-80%, grade 1 diastolic dysfunction, PASP 36, small effusion.  Marland Kitchen PAF (paroxysmal atrial fibrillation) ([place])    a. dx in 2008;  b. CHADS2-VASc=5;  c. changed to [place] during admx 05/2013; d. Xarelto started 05/2013; e. s/p successful Afib ablation 06/30/2017  . PEA (Pulseless electrical activity) ([place])    a. admx 05/2013 for APE in setting of HTN emergency c/b PEA arrest and asp pneumonia, VDRF, AFib with RVR  . Raynaud's disease   . Status post radiation therapy 1988   mantle and periaortic    Past Surgical History:  Procedure Laterality Date  . ATRIAL FIBRILLATION ABLATION  06/30/2017  . ATRIAL FIBRILLATION ABLATION N/A 06/30/2017   Procedure: Atrial Fibrillation Ablation;  Surgeon: Constance Haw, MD;  Location: [facility];  Service: Cardiovascular;  Laterality: N/A;  . BREAST LUMPECTOMY  2009   right, lymph node biopsy  . BTL  1971  . DILATION AND CURETTAGE OF UTERUS    . IR RADIOLOGY PERIPHERAL GUIDED IV START  06/23/2017  . IR US GUIDE VASC ACCESS LEFT  06/23/2017  . MASS EXCISION  03/28/2012   Procedure: EXCISION MASS;  Surgeon: Edward Jolly, MD;  Location: [facility];  Service: General;  Laterality: Right;  Excision right chest wall mass  . MASTECTOMY  2009   right breast  . POLYPECTOMY  1990   D&C (also in 1977 and [place])     . PORT-A-CATH REMOVAL  10/03/2012   Procedure: REMOVAL PORT-A-CATH;  Surgeon: Edward Jolly, MD;  Location: [facility];  Service: General;  Laterality: N/A;  . PORTACATH PLACEMENT  04/16/2012   Procedure: INSERTION PORT-A-CATH;  Surgeon: Edward Jolly, MD;  Location: [facility];  Service: General;  Laterality: Left;  Placement of Port-a-Cath, left subclavian  . R vein ligation & stripping  1976  . Spleenectomy  1988   for Hodgkin's disease  . Thoractomy  1968  . TISSUE EXPANDER  REMOVAL W/ REPLACEMENT OF IMPLANT    . TISSUE EXPANDER PLACEMENT     right breast  . TONSILLECTOMY AND ADENOIDECTOMY  1965     Current Outpatient Medications  Medication Sig Dispense Refill  . acetaminophen (TYLENOL) 325 MG tablet Take 650 mg by mouth at bedtime as needed for moderate pain.    Marland Kitchen atorvastatin (LIPITOR) 10 MG tablet Take 1 tablet (10 mg total) by mouth daily at 6 PM. 90 tablet 3  . b complex vitamins capsule Take 1 capsule by mouth daily after breakfast.    . calcium carbonate (OS-CAL) 600 MG TABS Take 600 mg by mouth at bedtime.     . carvedilol (COREG) 12.5 MG tablet TAKE 1 TABLET(12.5 MG) BY MOUTH TWICE DAILY WITH A MEAL 60 tablet 10  . Cholecalciferol (VITAMIN D) 2000 UNITS CAPS Take 2,000 Units by mouth daily with lunch.     . dofetilide (TIKOSYN) 250 MCG capsule TAKE 1 CAPSULE(250 MCG) BY MOUTH EVERY 12 HOURS 180 capsule 3  . levothyroxine (SYNTHROID, LEVOTHROID) 100 MCG tablet Take 100 mcg by mouth daily at 6 (six) AM.     . LUTEIN-ZEAXANTHIN PO Take 1 capsule by mouth daily after breakfast.     . magnesium oxide (MAG-OX 400) 400 MG tablet Take 400 mg by mouth 2 (two) times daily.     . Multiple Vitamin (MULTIVITAMIN) tablet Take 1 tablet by mouth daily after breakfast. "Natural Vitamin"    . OVER THE COUNTER MEDICATION Osteodenz Capsules: Take 1 capsule by mouth at bedtime    . OVER THE COUNTER MEDICATION Vegetarian Omega Green: Take 3 capsules by mouth daily with lunch    .  rivaroxaban (XARELTO) 20 MG TABS tablet TAKE 1 TABLET BY MOUTH DAILY WITH DINNER 90 tablet 3  . losartan (COZAAR) 50 MG tablet Take 1 tablet (50 mg total) by mouth 2 (two) times daily. 180 tablet 3   No current facility-administered medications for this visit.     Allergies:   Ace inhibitors; Benazepril; Epinephrine; Potassium-containing compounds; Prednisolone; and Taxotere [docetaxel]   Social History:  The patient  reports that  has never smoked. she has never used smokeless tobacco. She reports that she does not drink alcohol or use drugs.   Family History:  The patient's family history includes Hypertension in her mother.    ROS:  Please see the history of present illness.   Otherwise, review of systems is positive for none.   All other systems are reviewed and negative.   PHYSICAL EXAM: VS:  BP (!) 156/78   Pulse 81   Ht _0  (1.575 m)   Wt 139 lb (63 kg)   LMP 03/08/1995   BMI 25.42 kg/m  , BMI Body mass index is 25.42 kg/m. GEN: Well nourished, well developed, in no acute distress  HEENT: normal  Neck: no JVD, carotid bruits, or masses Cardiac: iRRR; no murmurs, rubs, or gallops,no edema  Respiratory:  clear to auscultation bilaterally, normal work of breathing GI: soft, nontender, nondistended, + BS MS: no deformity or atrophy  Skin: warm and dry Neuro:  Strength and sensation are intact Psych: euthymic mood, full affect  EKG:  EKG is ordered today. Personal review of the ekg ordered shows sinus rhythm, PVCs   Recent Labs: 04/21/2017: TSH 1.990 09/21/2017: Hemoglobin 13.0; Platelets 270 10/11/2017: BUN 22; Creatinine, Ser 0.78; Magnesium 2.3; Potassium 4.9; Sodium 141    Lipid Panel     Component Value Date/Time   CHOL 163 05/18/2013 0440   TRIG 175 (H) 05/18/2013 0440   HDL 26 (L) 05/18/2013 0440   CHOLHDL 6.3 05/18/2013 0440   VLDL 35 05/18/2013 0440   LDLCALC 102 (H) 05/18/2013 0440     Wt Readings from Last 3 Encounters:  01/18/18 139 lb (63 kg)    12/28/17 136 lb 4 oz (61.8 kg)  10/09/17 137 lb 6.4 oz (62.3 kg)      Other studies Reviewed: Additional studies/ records that were reviewed today include: TTE 05/05/17, SPECT 05/05/17  Review of the above records today demonstrates:  - Left ventricle: The cavity size was normal. There was mild   concentric hypertrophy. Systolic function was normal. The   estimated ejection fraction was in the range of 55% to 60%. Wall   motion was normal; there were no regional wall motion   abnormalities. Features are consistent with a pseudonormal left   ventricular filling pattern, with concomitant abnormal relaxation   and increased filling pressure (grade 2 diastolic dysfunction).   Doppler parameters are consistent with high ventricular filling   pressure. - Aortic valve: Poorly visualized. Trileaflet; mildly thickened,   mildly calcified leaflets. There was trivial regurgitation. - Mitral valve: There was moderate regurgitation. - Left atrium: The atrium was mildly dilated. - Tricuspid valve: There was mild regurgitation. - Pulmonary arteries: PA peak pressure: 39 mm Hg (S). - Impressions: The right ventricular systolic pressure was   increased consistent with mild pulmonary hypertension.    Nuclear stress EF: 58%.  Defect 1: There is a medium defect of mild severity present in the basal anteroseptal and mid anteroseptal location.  This is a low risk study.  The left ventricular ejection fraction is normal (55-65%).   Low risk stress nuclear study with fixed septal defect likely related to LBBB; no ischemia; EF 58 with mild septal hypokinesis.  ASSESSMENT AND PLAN:  1.  Paroxysmal atrial fibrillation: On dofetilide, Coreg, Xarelto.  AF ablation 06/30/17.  Further episodes of atrial fibrillation.  No changes at this time.  This patients CHA2DS2-VASc Score and unadjusted Ischemic Stroke Rate (% per year) is equal to 3.2 % stroke rate/year from a score of 3  Above score calculated as  1 point each if present [CHF, HTN, DM, Vascular=MI/PAD/Aortic Plaque, Age if 66-74, or  Female] Above score calculated as 2 points each if present [Age > 75, or Stroke/TIA/TE]  2. Chronic diastolic heart failure: Pressure is elevated today, but has been normal at home.  She has recently gotten over a GI illness.  She has been checking her blood pressures at home and they have been in the 120s.  No changes.  3. Hypertension: Elevated today but has been normal at home.  No changes.  4.  PVCs: She is having a high burden of PVCs today, which could be part of the cause of her fatigue.  She is in sinus rhythm.  Her PVCs are fortunately late coupled and thus not at high risk of torsades.  We Dayne Dekay get a 48-hour monitor to further determine her PVC burden.  Disease appears to be coming from the RVOT.  5. Preprocedure evaluation: Plan to have colonoscopy.  At this time is okay to come off of her Xarelto.  She would need to stop it for 2 days prior to the procedure.  No need for bridging.  She would be at low to intermediate risk for a low risk procedure.  No further cardiac testing is needed.  Current medicines are reviewed at length with the patient today.   The patient does not have concerns regarding her medicines.  The following changes were made today: None  Labs/ tests ordered today include:  Orders Placed This Encounter  Procedures  . Holter monitor - 48 hour  . EKG 12-Lead     Disposition:   FU with Brittanee Ghazarian 6 months  Signed, Jaleal Schliep Meredith Leeds, MD  01/18/2018 3:00 PM     [address] [place] [address] [phone] (office) [phone] (fax)

## 2018-01-18 NOTE — Telephone Encounter (Signed)
Per Dr. Curt Bears, pt is cleared for sx. Appropriate fax has been sent to [facility].

## 2018-01-18 NOTE — Telephone Encounter (Signed)
Pt is scheduled to see Dr. Curt Bears, today @ 2:30, and per [facility], she will let him know that pt needs clearance.

## 2018-01-29 ENCOUNTER — Ambulatory Visit (INDEPENDENT_AMBULATORY_CARE_PROVIDER_SITE_OTHER): Payer: Medicare Other

## 2018-01-29 DIAGNOSIS — I493 Ventricular premature depolarization: Secondary | ICD-10-CM | POA: Diagnosis not present

## 2018-01-29 DIAGNOSIS — R001 Bradycardia, unspecified: Secondary | ICD-10-CM | POA: Diagnosis not present

## 2018-02-01 DIAGNOSIS — M542 Cervicalgia: Secondary | ICD-10-CM | POA: Diagnosis not present

## 2018-02-02 ENCOUNTER — Telehealth: Payer: Self-pay | Admitting: Cardiology

## 2018-02-02 NOTE — Telephone Encounter (Signed)
[name] is calling to find out how long will it be before she results from the Holter Monitor she had place on the 01/29/18 . Please call

## 2018-02-02 NOTE — Telephone Encounter (Signed)
Pt returned monitor Wednesday.   Informed pt that we would have results by Monday and then Dr. Curt Bears will review findings.   She is aware it will middle to end of next week before she will receive the monitor findings.  She appreciates the call and information.

## 2018-03-06 DIAGNOSIS — K640 First degree hemorrhoids: Secondary | ICD-10-CM | POA: Diagnosis not present

## 2018-03-06 DIAGNOSIS — Z1211 Encounter for screening for malignant neoplasm of colon: Secondary | ICD-10-CM | POA: Diagnosis not present

## 2018-03-06 DIAGNOSIS — D126 Benign neoplasm of colon, unspecified: Secondary | ICD-10-CM | POA: Diagnosis not present

## 2018-03-09 DIAGNOSIS — D126 Benign neoplasm of colon, unspecified: Secondary | ICD-10-CM | POA: Diagnosis not present

## 2018-03-09 DIAGNOSIS — M542 Cervicalgia: Secondary | ICD-10-CM | POA: Diagnosis not present

## 2018-03-09 DIAGNOSIS — Z1211 Encounter for screening for malignant neoplasm of colon: Secondary | ICD-10-CM | POA: Diagnosis not present

## 2018-03-15 DIAGNOSIS — D225 Melanocytic nevi of trunk: Secondary | ICD-10-CM | POA: Diagnosis not present

## 2018-03-15 DIAGNOSIS — L905 Scar conditions and fibrosis of skin: Secondary | ICD-10-CM | POA: Diagnosis not present

## 2018-03-15 DIAGNOSIS — L821 Other seborrheic keratosis: Secondary | ICD-10-CM | POA: Diagnosis not present

## 2018-03-15 DIAGNOSIS — D2262 Melanocytic nevi of left upper limb, including shoulder: Secondary | ICD-10-CM | POA: Diagnosis not present

## 2018-03-15 DIAGNOSIS — D2271 Melanocytic nevi of right lower limb, including hip: Secondary | ICD-10-CM | POA: Diagnosis not present

## 2018-03-15 DIAGNOSIS — D485 Neoplasm of uncertain behavior of skin: Secondary | ICD-10-CM | POA: Diagnosis not present

## 2018-03-15 DIAGNOSIS — D224 Melanocytic nevi of scalp and neck: Secondary | ICD-10-CM | POA: Diagnosis not present

## 2018-03-15 DIAGNOSIS — D2272 Melanocytic nevi of left lower limb, including hip: Secondary | ICD-10-CM | POA: Diagnosis not present

## 2018-03-15 DIAGNOSIS — D2261 Melanocytic nevi of right upper limb, including shoulder: Secondary | ICD-10-CM | POA: Diagnosis not present

## 2018-03-15 DIAGNOSIS — L820 Inflamed seborrheic keratosis: Secondary | ICD-10-CM | POA: Diagnosis not present

## 2018-03-15 DIAGNOSIS — C4441 Basal cell carcinoma of skin of scalp and neck: Secondary | ICD-10-CM | POA: Diagnosis not present

## 2018-03-19 DIAGNOSIS — M542 Cervicalgia: Secondary | ICD-10-CM | POA: Diagnosis not present

## 2018-04-03 DIAGNOSIS — M8588 Other specified disorders of bone density and structure, other site: Secondary | ICD-10-CM | POA: Diagnosis not present

## 2018-04-23 ENCOUNTER — Telehealth (HOSPITAL_COMMUNITY): Payer: Self-pay | Admitting: Cardiovascular Disease

## 2018-04-23 ENCOUNTER — Telehealth: Payer: Self-pay

## 2018-04-23 DIAGNOSIS — I340 Nonrheumatic mitral (valve) insufficiency: Secondary | ICD-10-CM

## 2018-04-23 NOTE — Telephone Encounter (Signed)
User: Cherie Dark A Date/time: 04/23/18 1:25 PM  Comment: Called pt and lmsg for her to CB to get scheduled for an echo..  Context:  Outcome: Left Message  Phone number: [phone] Phone Type: Home Phone  Comm. type: Telephone Call type: Outgoing  Contact: Lyndle Herrlich Relation to patient: Self

## 2018-04-23 NOTE — Telephone Encounter (Signed)
Patient is due for an echocardiogram. Will put in order per Dr. Kyla Balzarine result note as seen below. Will send message to scheduling.   Notes recorded by Josue Hector, MD on 05/16/2017 at 7:01 AM EDT Moderate MR stable EF normal f/u echo in a year

## 2018-04-23 NOTE — Telephone Encounter (Signed)
-----   Message from Michaelyn Barter, RN sent at 05/23/2017  2:00 PM EDT ----- Echo in 2019 May

## 2018-05-03 ENCOUNTER — Ambulatory Visit (HOSPITAL_COMMUNITY): Payer: Medicare Other | Attending: Cardiovascular Disease

## 2018-05-03 ENCOUNTER — Other Ambulatory Visit: Payer: Self-pay

## 2018-05-03 DIAGNOSIS — I340 Nonrheumatic mitral (valve) insufficiency: Secondary | ICD-10-CM | POA: Diagnosis not present

## 2018-05-03 DIAGNOSIS — Z853 Personal history of malignant neoplasm of breast: Secondary | ICD-10-CM | POA: Diagnosis not present

## 2018-05-03 DIAGNOSIS — I5030 Unspecified diastolic (congestive) heart failure: Secondary | ICD-10-CM | POA: Diagnosis not present

## 2018-05-03 DIAGNOSIS — Z9011 Acquired absence of right breast and nipple: Secondary | ICD-10-CM | POA: Diagnosis not present

## 2018-05-03 DIAGNOSIS — I361 Nonrheumatic tricuspid (valve) insufficiency: Secondary | ICD-10-CM | POA: Insufficient documentation

## 2018-05-03 DIAGNOSIS — I517 Cardiomegaly: Secondary | ICD-10-CM | POA: Diagnosis not present

## 2018-05-03 DIAGNOSIS — Z8571 Personal history of Hodgkin lymphoma: Secondary | ICD-10-CM | POA: Diagnosis not present

## 2018-05-03 DIAGNOSIS — I447 Left bundle-branch block, unspecified: Secondary | ICD-10-CM | POA: Diagnosis not present

## 2018-05-03 DIAGNOSIS — I4891 Unspecified atrial fibrillation: Secondary | ICD-10-CM | POA: Insufficient documentation

## 2018-05-07 DIAGNOSIS — Z85828 Personal history of other malignant neoplasm of skin: Secondary | ICD-10-CM | POA: Diagnosis not present

## 2018-05-07 DIAGNOSIS — C4441 Basal cell carcinoma of skin of scalp and neck: Secondary | ICD-10-CM | POA: Diagnosis not present

## 2018-05-15 DIAGNOSIS — I4891 Unspecified atrial fibrillation: Secondary | ICD-10-CM | POA: Diagnosis not present

## 2018-05-15 DIAGNOSIS — E039 Hypothyroidism, unspecified: Secondary | ICD-10-CM | POA: Diagnosis not present

## 2018-05-15 DIAGNOSIS — Z853 Personal history of malignant neoplasm of breast: Secondary | ICD-10-CM | POA: Diagnosis not present

## 2018-05-15 DIAGNOSIS — E7800 Pure hypercholesterolemia, unspecified: Secondary | ICD-10-CM | POA: Diagnosis not present

## 2018-06-16 ENCOUNTER — Emergency Department (HOSPITAL_COMMUNITY): Payer: Medicare Other

## 2018-06-16 ENCOUNTER — Inpatient Hospital Stay (HOSPITAL_COMMUNITY)
Admission: EM | Admit: 2018-06-16 | Discharge: 2018-06-19 | DRG: 193 | Disposition: A | Discharge status: Home / Self Care | Payer: Medicare Other | Attending: Internal Medicine | Admitting: Internal Medicine

## 2018-06-16 ENCOUNTER — Other Ambulatory Visit: Payer: Self-pay

## 2018-06-16 DIAGNOSIS — R05 Cough: Secondary | ICD-10-CM | POA: Diagnosis not present

## 2018-06-16 DIAGNOSIS — J189 Pneumonia, unspecified organism: Secondary | ICD-10-CM | POA: Diagnosis not present

## 2018-06-16 DIAGNOSIS — I5032 Chronic diastolic (congestive) heart failure: Secondary | ICD-10-CM | POA: Diagnosis not present

## 2018-06-16 DIAGNOSIS — I428 Other cardiomyopathies: Secondary | ICD-10-CM | POA: Diagnosis not present

## 2018-06-16 DIAGNOSIS — I110 Hypertensive heart disease with heart failure: Secondary | ICD-10-CM | POA: Diagnosis not present

## 2018-06-16 DIAGNOSIS — I7300 Raynaud's syndrome without gangrene: Secondary | ICD-10-CM | POA: Diagnosis present

## 2018-06-16 DIAGNOSIS — Z7901 Long term (current) use of anticoagulants: Secondary | ICD-10-CM | POA: Diagnosis not present

## 2018-06-16 DIAGNOSIS — I10 Essential (primary) hypertension: Secondary | ICD-10-CM | POA: Diagnosis present

## 2018-06-16 DIAGNOSIS — Z888 Allergy status to other drugs, medicaments and biological substances status: Secondary | ICD-10-CM

## 2018-06-16 DIAGNOSIS — E43 Unspecified severe protein-calorie malnutrition: Secondary | ICD-10-CM | POA: Diagnosis not present

## 2018-06-16 DIAGNOSIS — Z853 Personal history of malignant neoplasm of breast: Secondary | ICD-10-CM

## 2018-06-16 DIAGNOSIS — J9601 Acute respiratory failure with hypoxia: Secondary | ICD-10-CM

## 2018-06-16 DIAGNOSIS — Z6825 Body mass index (BMI) 25.0-25.9, adult: Secondary | ICD-10-CM

## 2018-06-16 DIAGNOSIS — Z8572 Personal history of non-Hodgkin lymphomas: Secondary | ICD-10-CM | POA: Diagnosis not present

## 2018-06-16 DIAGNOSIS — R0603 Acute respiratory distress: Secondary | ICD-10-CM | POA: Diagnosis not present

## 2018-06-16 DIAGNOSIS — I493 Ventricular premature depolarization: Secondary | ICD-10-CM | POA: Diagnosis not present

## 2018-06-16 DIAGNOSIS — Z923 Personal history of irradiation: Secondary | ICD-10-CM | POA: Diagnosis not present

## 2018-06-16 DIAGNOSIS — Z9081 Acquired absence of spleen: Secondary | ICD-10-CM | POA: Diagnosis not present

## 2018-06-16 DIAGNOSIS — E785 Hyperlipidemia, unspecified: Secondary | ICD-10-CM | POA: Diagnosis present

## 2018-06-16 DIAGNOSIS — J9811 Atelectasis: Secondary | ICD-10-CM | POA: Diagnosis present

## 2018-06-16 DIAGNOSIS — E039 Hypothyroidism, unspecified: Secondary | ICD-10-CM | POA: Diagnosis present

## 2018-06-16 DIAGNOSIS — J8410 Pulmonary fibrosis, unspecified: Secondary | ICD-10-CM | POA: Diagnosis present

## 2018-06-16 DIAGNOSIS — J13 Pneumonia due to Streptococcus pneumoniae: Secondary | ICD-10-CM | POA: Diagnosis not present

## 2018-06-16 DIAGNOSIS — Z8674 Personal history of sudden cardiac arrest: Secondary | ICD-10-CM

## 2018-06-16 DIAGNOSIS — Z9221 Personal history of antineoplastic chemotherapy: Secondary | ICD-10-CM

## 2018-06-16 DIAGNOSIS — I447 Left bundle-branch block, unspecified: Secondary | ICD-10-CM | POA: Diagnosis present

## 2018-06-16 DIAGNOSIS — M810 Age-related osteoporosis without current pathological fracture: Secondary | ICD-10-CM | POA: Diagnosis not present

## 2018-06-16 DIAGNOSIS — Z79899 Other long term (current) drug therapy: Secondary | ICD-10-CM | POA: Diagnosis not present

## 2018-06-16 DIAGNOSIS — Z9010 Acquired absence of unspecified breast and nipple: Secondary | ICD-10-CM

## 2018-06-16 DIAGNOSIS — I480 Paroxysmal atrial fibrillation: Secondary | ICD-10-CM | POA: Diagnosis not present

## 2018-06-16 DIAGNOSIS — R0602 Shortness of breath: Secondary | ICD-10-CM | POA: Diagnosis not present

## 2018-06-16 DIAGNOSIS — I6523 Occlusion and stenosis of bilateral carotid arteries: Secondary | ICD-10-CM | POA: Diagnosis not present

## 2018-06-16 LAB — CBC WITH DIFFERENTIAL/PLATELET
Abs Immature Granulocytes: 0.1 10*3/uL (ref 0.0–0.1)
Basophils Absolute: 0.1 10*3/uL (ref 0.0–0.1)
Basophils Relative: 1 %
EOS ABS: 0 10*3/uL (ref 0.0–0.7)
EOS PCT: 0 %
HEMATOCRIT: 38.8 % (ref 36.0–46.0)
Hemoglobin: 12.6 g/dL (ref 12.0–15.0)
Immature Granulocytes: 1 %
LYMPHS ABS: 0.9 10*3/uL (ref 0.7–4.0)
Lymphocytes Relative: 7 %
MCH: 30.4 pg (ref 26.0–34.0)
MCHC: 32.5 g/dL (ref 30.0–36.0)
MCV: 93.5 fL (ref 78.0–100.0)
MONOS PCT: 8 %
Monocytes Absolute: 1.1 10*3/uL — ABNORMAL HIGH (ref 0.1–1.0)
Neutro Abs: 11.2 10*3/uL — ABNORMAL HIGH (ref 1.7–7.7)
Neutrophils Relative %: 83 %
Platelets: 238 10*3/uL (ref 150–400)
RBC: 4.15 MIL/uL (ref 3.87–5.11)
RDW: 14.2 % (ref 11.5–15.5)
WBC: 13.3 10*3/uL — ABNORMAL HIGH (ref 4.0–10.5)

## 2018-06-16 LAB — URINALYSIS, ROUTINE W REFLEX MICROSCOPIC
BILIRUBIN URINE: NEGATIVE
Bacteria, UA: NONE SEEN
Glucose, UA: NEGATIVE mg/dL
KETONES UR: 20 mg/dL — AB
LEUKOCYTES UA: NEGATIVE
NITRITE: NEGATIVE
PROTEIN: NEGATIVE mg/dL
Specific Gravity, Urine: 1.023 (ref 1.005–1.030)
pH: 5 (ref 5.0–8.0)

## 2018-06-16 LAB — BASIC METABOLIC PANEL
ANION GAP: 12 (ref 5–15)
BUN: 16 mg/dL (ref 8–23)
CHLORIDE: 104 mmol/L (ref 98–111)
CO2: 23 mmol/L (ref 22–32)
Calcium: 8.6 mg/dL — ABNORMAL LOW (ref 8.9–10.3)
Creatinine, Ser: 0.77 mg/dL (ref 0.44–1.00)
GFR calc Af Amer: >60 mL/min (ref >60)
GFR calc non Af Amer: >60 mL/min (ref >60)
GLUCOSE: 128 mg/dL — AB (ref 70–99)
POTASSIUM: 4 mmol/L (ref 3.5–5.1)
SODIUM: 139 mmol/L (ref 135–145)

## 2018-06-16 LAB — STREP PNEUMONIAE URINARY ANTIGEN: Strep Pneumo Urinary Antigen: NEGATIVE

## 2018-06-16 LAB — I-STAT CG4 LACTIC ACID, ED: Lactic Acid, Venous: 1.31 mmol/L (ref 0.5–1.9)

## 2018-06-16 LAB — TROPONIN I: Troponin I: <0.03 ng/mL (ref <0.03)

## 2018-06-16 LAB — BRAIN NATRIURETIC PEPTIDE: B Natriuretic Peptide: 237.7 pg/mL — ABNORMAL HIGH (ref 0.0–100.0)

## 2018-06-16 MED ORDER — MAGNESIUM OXIDE 400 (241.3 MG) MG PO TABS
400.0000 mg | ORAL_TABLET | Freq: Two times a day (BID) | ORAL | Status: DC
  Administered 2018-06-16 – 2018-06-19 (×7): 400 mg via ORAL
  Filled 2018-06-16 (×9): qty 1

## 2018-06-16 MED ORDER — DOXYCYCLINE HYCLATE 100 MG IV SOLR
100.0000 mg | Freq: Two times a day (BID) | INTRAVENOUS | Status: DC
Start: 2018-06-16 — End: 2018-06-19
  Administered 2018-06-16 – 2018-06-18 (×6): 100 mg via INTRAVENOUS
  Filled 2018-06-16 (×7): qty 100

## 2018-06-16 MED ORDER — CALCIUM CARBONATE ANTACID 500 MG PO CHEW
1.0000 | CHEWABLE_TABLET | Freq: Every day | ORAL | Status: DC
  Administered 2018-06-16 – 2018-06-18 (×3): 200 mg via ORAL
  Filled 2018-06-16 (×3): qty 1

## 2018-06-16 MED ORDER — LOSARTAN POTASSIUM 50 MG PO TABS
50.0000 mg | ORAL_TABLET | Freq: Two times a day (BID) | ORAL | Status: DC
  Administered 2018-06-16 – 2018-06-19 (×7): 50 mg via ORAL
  Filled 2018-06-16 (×7): qty 1

## 2018-06-16 MED ORDER — ATORVASTATIN CALCIUM 10 MG PO TABS
10.0000 mg | ORAL_TABLET | Freq: Every day | ORAL | Status: DC
  Administered 2018-06-16 – 2018-06-18 (×3): 10 mg via ORAL
  Filled 2018-06-16 (×3): qty 1

## 2018-06-16 MED ORDER — SODIUM CHLORIDE 0.9 % IV SOLN: 250.0000 mL | INTRAVENOUS | Status: DC | PRN

## 2018-06-16 MED ORDER — CALCIUM CARBONATE 600 MG PO TABS: 600.0000 mg | ORAL_TABLET | Freq: Every day | ORAL | Status: DC

## 2018-06-16 MED ORDER — DOFETILIDE 250 MCG PO CAPS: 250.0000 ug | ORAL_CAPSULE | Freq: Two times a day (BID) | ORAL | Status: DC

## 2018-06-16 MED ORDER — VITAMIN D 1000 UNITS PO TABS
2000.0000 [IU] | ORAL_TABLET | Freq: Every day | ORAL | Status: DC
  Administered 2018-06-16 – 2018-06-19 (×4): 2000 [IU] via ORAL
  Filled 2018-06-16 (×4): qty 2

## 2018-06-16 MED ORDER — ACETAMINOPHEN 325 MG PO TABS
650.0000 mg | ORAL_TABLET | Freq: Four times a day (QID) | ORAL | Status: DC | PRN
  Administered 2018-06-16 – 2018-06-17 (×2): 650 mg via ORAL
  Filled 2018-06-16 (×2): qty 2

## 2018-06-16 MED ORDER — SODIUM CHLORIDE 0.9 % IV SOLN
2.0000 g | INTRAVENOUS | Status: DC
  Administered 2018-06-16 – 2018-06-19 (×4): 2 g via INTRAVENOUS
  Filled 2018-06-16 (×4): qty 20

## 2018-06-16 MED ORDER — SODIUM CHLORIDE 0.9% FLUSH
3.0000 mL | Freq: Two times a day (BID) | INTRAVENOUS | Status: DC
  Administered 2018-06-16 – 2018-06-18 (×6): 3 mL via INTRAVENOUS

## 2018-06-16 MED ORDER — LEVOTHYROXINE SODIUM 100 MCG PO TABS
100.0000 ug | ORAL_TABLET | Freq: Every day | ORAL | Status: DC
  Administered 2018-06-17 – 2018-06-19 (×3): 100 ug via ORAL
  Filled 2018-06-16 (×2): qty 1
  Filled 2018-06-16: qty 2

## 2018-06-16 MED ORDER — SODIUM CHLORIDE 0.9 % IV SOLN
500.0000 mg | INTRAVENOUS | Status: DC
  Administered 2018-06-16: 500 mg via INTRAVENOUS
  Filled 2018-06-16: qty 500

## 2018-06-16 MED ORDER — RIVAROXABAN 20 MG PO TABS
20.0000 mg | ORAL_TABLET | Freq: Every day | ORAL | Status: DC
  Administered 2018-06-16 – 2018-06-18 (×3): 20 mg via ORAL
  Filled 2018-06-16 (×3): qty 1

## 2018-06-16 MED ORDER — FUROSEMIDE 10 MG/ML IJ SOLN
40.0000 mg | Freq: Once | INTRAMUSCULAR | Status: AC
  Administered 2018-06-16: 40 mg via INTRAVENOUS
  Filled 2018-06-16: qty 4

## 2018-06-16 MED ORDER — SODIUM CHLORIDE 0.9% FLUSH: 3.0000 mL | INTRAVENOUS | Status: DC | PRN

## 2018-06-16 MED ORDER — CARVEDILOL 12.5 MG PO TABS
12.5000 mg | ORAL_TABLET | Freq: Two times a day (BID) | ORAL | Status: DC
  Administered 2018-06-16 – 2018-06-19 (×6): 12.5 mg via ORAL
  Filled 2018-06-16 (×6): qty 1

## 2018-06-16 NOTE — H&P (Signed)
History and Physical    [name] YSA:[id number] DOB: December 21, 1945 DOA: 06/16/2018  PCP: [name], MD   Patient coming from: Home    Chief Complaint: Shortness of breath  HPI: [name] is a 72 y.o. female with medical history significant of breast cancer status post mastectomy status post chemoradiation after recurrence, remote history of non-Hodgkin's lymphoma status post mantle radiation complicated by atrophy of the neck muscles and carotid vasculature, hypothyroidism, grade 2 chronic diastolic heart failure by echo on 05/03/2018, paroxysmal atrial fibrillation on rivaroxaban and dofetilide, hyperlipidemia, hypertension who comes in with shortness of breath and hypoxia.  Patient reports that for the past 2 days she has had increasing shortness of breath and dyspnea on exertion.  She has a chronic cough which is unchanged.  She has not had any sputum production.  She has not had any congestion or rhinorrhea.  She was seen by her PCP who gave her a inhaled bronchodilator which she reported helped somewhat but stopped helping today.  She denies any fevers but has had some chills.  She has not had any palpitations or chest pain.  She did not have any travel recently.  She has not noted any choking or difficulty swallowing.  She has not had any episodes of aspiration in the past.  EMS was called that she was quite short of breath and she was noted to be saturating in the 80s and was placed on BiPAP.  ED Course: In the ED patient was febrile to 100.4.  She was mildly tachycardic.  She was tachypneic.  Her blood pressure was quite elevated.  She was saturating 96% on 2 L.  Her labs are notable for elevated white blood cell count of 13.3 with a left shift.  Chest x-ray showed small left pleural effusion along with small right pleural effusion and left lower lobe atelectasis or pneumonia patient was given IV ceftriaxone and azithromycin.  Review of Systems: As per HPI otherwise 10 point  review of systems negative.    Past Medical History:  Diagnosis Date  . Anemia   . Arthritis    fingers   . Blood transfusion    1968  . Breast cancer ([place]) 2009   T1N0M0 DCIS right breast  . Breast cancer ([place]) 2013   a. reocurrence right breast;  b. s/p Taxotere, Cytoxan and XRT  . Carotid stenosis    a. dopplers 3/23:  RICA 5-57%, LICA 32-20% => repeat due in 01/2013;  b.  Carotid US (2/54): RICA 2-70%; LICA 62-37% - f/u 6 mos  . Fracture of left patella 12/2016  . History of radiation therapy 07/10/12-08/13/12   right breast/chest wall  . Hodgkin's disease    stage 2b;  s/p XRT 1988  . HTN (hypertension)   . Hypothyroidism    2/2 XRT for Hodgkins Lymphoma  . LBBB (left bundle branch block)   . Mucositis 04/29/2012  . NICM (nonischemic cardiomyopathy) ([place])    a. EF as low as 45%;  b. LHC 5/05:  LM 10%, pLAD 30%, EF 45-50%,   c. Echo 5/13:  EF 60-65%, Gr 1 diast dysfn, MAC;   d.  Echo 11/13:  EF 60-65%, Gr 1 diast dysfn, Tr AI, mild MR;  d.  Echo 05/10/13: EF 62-83%, grade 1 diastolic dysfunction, PASP 36, small effusion.  Marland Kitchen PAF (paroxysmal atrial fibrillation) ([place])    a. dx in 2008;  b. CHADS2-VASc=5;  c. changed to [place] during admx 05/2013; d. Xarelto started 05/2013; e.  s/p successful Afib ablation 06/30/2017  . PEA (Pulseless electrical activity) ([place])    a. admx 05/2013 for APE in setting of HTN emergency c/b PEA arrest and asp pneumonia, VDRF, AFib with RVR  . Raynaud's disease   . Status post radiation therapy 1988   mantle and periaortic     Past Surgical History:  Procedure Laterality Date  . ATRIAL FIBRILLATION ABLATION  06/30/2017  . ATRIAL FIBRILLATION ABLATION N/A 06/30/2017   Procedure: Atrial Fibrillation Ablation;  Surgeon: Constance Haw, MD;  Location: [facility];  Service: Cardiovascular;  Laterality: N/A;  . BREAST LUMPECTOMY  2009   right, lymph node biopsy  . BTL  1971  . DILATION AND CURETTAGE OF UTERUS    . IR RADIOLOGY PERIPHERAL  GUIDED IV START  06/23/2017  . IR US GUIDE VASC ACCESS LEFT  06/23/2017  . MASS EXCISION  03/28/2012   Procedure: EXCISION MASS;  Surgeon: Edward Jolly, MD;  Location: [facility];  Service: General;  Laterality: Right;  Excision right chest wall mass  . MASTECTOMY  2009   right breast  . POLYPECTOMY  1990   D&C (also in 1977 and [place])   . PORT-A-CATH REMOVAL  10/03/2012   Procedure: REMOVAL PORT-A-CATH;  Surgeon: Edward Jolly, MD;  Location: [facility];  Service: General;  Laterality: N/A;  . PORTACATH PLACEMENT  04/16/2012   Procedure: INSERTION PORT-A-CATH;  Surgeon: Edward Jolly, MD;  Location: [facility];  Service: General;  Laterality: Left;  Placement of Port-a-Cath, left subclavian  . R vein ligation & stripping  1976  . Spleenectomy  1988   for Hodgkin's disease  . Thoractomy  1968  . TISSUE EXPANDER  REMOVAL W/ REPLACEMENT OF IMPLANT    . TISSUE EXPANDER PLACEMENT     right breast  . TONSILLECTOMY AND ADENOIDECTOMY  1965     reports that she has never smoked. She has never used smokeless tobacco. She reports that she does not drink alcohol or use drugs.  Allergies  Allergen Reactions  . Ace Inhibitors Cough  . Benazepril Cough  . Epinephrine Other (See Comments)    Feels "buzzed" and heart rate increases  . Potassium-Containing Compounds     Projectile vomiting--liquid potassium   . Prednisolone Other (See Comments)    Arrhythmia(s)  . Taxotere [Docetaxel] Other (See Comments)    Fluid overload    Family History  Problem Relation Age of Onset  . Hypertension Mother   . Cancer Neg Hx     Prior to Admission medications   Medication Sig Start Date End Date Taking? Authorizing Provider  acetaminophen (TYLENOL) 325 MG tablet Take 650 mg by mouth at bedtime as needed for moderate pain.   Yes [provider]  atorvastatin (LIPITOR) 10 MG tablet Take 1 tablet (10 mg total) by mouth daily at 6 PM. 01/04/18  Yes Josue Hector, MD  b  complex vitamins capsule Take 1 capsule by mouth daily after breakfast.   Yes [provider]  calcium carbonate (OS-CAL) 600 MG TABS Take 600 mg by mouth at bedtime.    Yes [provider]  carvedilol (COREG) 12.5 MG tablet TAKE 1 TABLET(12.5 MG) BY MOUTH TWICE DAILY WITH A MEAL 07/03/17  Yes Josue Hector, MD  Cholecalciferol (VITAMIN D) 2000 UNITS CAPS Take 2,000 Units by mouth daily with lunch.    Yes [provider]  dofetilide (TIKOSYN) 250 MCG capsule TAKE 1 CAPSULE(250 MCG) BY MOUTH EVERY 12  HOURS 09/22/17  Yes Josue Hector, MD  levothyroxine (SYNTHROID, LEVOTHROID) 100 MCG tablet Take 100 mcg by mouth daily at 6 (six) AM.    Yes [provider]  losartan (COZAAR) 50 MG tablet Take 1 tablet (50 mg total) by mouth 2 (two) times daily. 06/20/17 06/16/18 Yes Camnitz, Will Hassell Done, MD  magnesium oxide (MAG-OX 400) 400 MG tablet Take 400 mg by mouth 2 (two) times daily.    Yes [provider]  Misc Natural Products (LUTEIN 20) CAPS Take 20 mg by mouth daily with breakfast.   Yes [provider]  Multiple Vitamin (MULTIVITAMIN) tablet Take 1 tablet by mouth daily after breakfast. "Natural Vitamin"   Yes [provider]  OVER THE COUNTER MEDICATION BDZ tablets: Take 1 tablet by mouth at bedtime   Yes [provider]  OVER THE COUNTER MEDICATION Vegetarian Omega Green: Take 3 capsules by mouth daily with lunch   Yes [provider]  rivaroxaban (XARELTO) 20 MG TABS tablet TAKE 1 TABLET BY MOUTH DAILY WITH DINNER 06/02/17  Yes Josue Hector, MD    Physical Exam: Vitals:   06/16/18 0730 06/16/18 0800 06/16/18 0815 06/16/18 0830  BP: (!) 157/65 (!) 151/91 (!) 157/63 (!) 152/63  Pulse: 97 (!) 104 (!) 103 (!) 101  Resp: (!) 22 (!) 22 (!) 28 (!) 22  Temp:      TempSrc:      SpO2: 97% 96% 96% 96%    Constitutional: NAD, calm, comfortable Vitals:   06/16/18 0730 06/16/18 0800 06/16/18 0815 06/16/18 0830  BP: (!)  157/65 (!) 151/91 (!) 157/63 (!) 152/63  Pulse: 97 (!) 104 (!) 103 (!) 101  Resp: (!) 22 (!) 22 (!) 28 (!) 22  Temp:      TempSrc:      SpO2: 97% 96% 96% 96%   Eyes: Anicteric sclera ENMT: Moist mucous membranes, good dentition Neck: Atrophic neck muscles, weakness over moving neck, carotid bruit Respiratory: Moderately increased work of breathing, crackles right greater than left, no rhonchi or wheezes.  Cardiovascular: Distant heart sounds, irregularly irregular, no murmurs Abdomen: Soft, nontender, no rebound or guarding Musculoskeletal: 1+ lower extremity edema.  Skin: no rashes on visible skin Neurologic: Grossly intact, moving all extremities Psychiatric: Normal judgment and insight. Alert and oriented x 3. Normal mood.    Labs on Admission: I have personally reviewed following labs and imaging studies  CBC: Recent Labs  Lab 06/16/18 0651  WBC 13.3*  NEUTROABS 11.2*  HGB 12.6  HCT 38.8  MCV 93.5  PLT 654   Basic Metabolic Panel: Recent Labs  Lab 06/16/18 0651  NA 139  K 4.0  CL 104  CO2 23  GLUCOSE 128*  BUN 16  CREATININE 0.77  CALCIUM 8.6*   GFR: CrCl cannot be calculated (Unknown ideal weight.). Liver Function Tests: No results for input(s): AST, ALT, ALKPHOS, BILITOT, PROT, ALBUMIN in the last 168 hours. No results for input(s): LIPASE, AMYLASE in the last 168 hours. No results for input(s): AMMONIA in the last 168 hours. Coagulation Profile: No results for input(s): INR, PROTIME in the last 168 hours. Cardiac Enzymes: Recent Labs  Lab 06/16/18 0651  TROPONINI <0.03   BNP (last 3 results) No results for input(s): PROBNP in the last 8760 hours. HbA1C: No results for input(s): HGBA1C in the last 72 hours. CBG: No results for input(s): GLUCAP in the last 168 hours. Lipid Profile: No results for input(s): CHOL, HDL, LDLCALC, TRIG, CHOLHDL, LDLDIRECT in the last  72 hours. Thyroid Function Tests: No results for input(s): TSH, T4TOTAL, FREET4,  T3FREE, THYROIDAB in the last 72 hours. Anemia Panel: No results for input(s): VITAMINB12, FOLATE, FERRITIN, TIBC, IRON, RETICCTPCT in the last 72 hours. Urine analysis:    Component Value Date/Time   COLORURINE AMBER (A) 05/10/2013 0438   APPEARANCEUR CLOUDY (A) 05/10/2013 0438   LABSPEC 1.021 05/10/2013 0438   PHURINE 5.5 05/10/2013 0438   GLUCOSEU NEGATIVE 05/10/2013 0438   HGBUR SMALL (A) 05/10/2013 0438   BILIRUBINUR SMALL (A) 05/10/2013 0438   KETONESUR NEGATIVE 05/10/2013 0438   PROTEINUR NEGATIVE 05/10/2013 0438   UROBILINOGEN 0.2 05/10/2013 0438   NITRITE NEGATIVE 05/10/2013 0438   LEUKOCYTESUR MODERATE (A) 05/10/2013 0438    Radiological Exams on Admission: Dg Chest Port 1 View  Result Date: 06/16/2018 CLINICAL DATA:  Shortness of breath. EXAM: PORTABLE CHEST 1 VIEW COMPARISON:  09/21/2017. FINDINGS: Normal sized heart. Ill-defined opacity in both lower lung zones with some patchy opacity in the left lower lobe. There is also a small left pleural effusion and possible small posteriorly layering right pleural effusion. Diffuse osteopenia. IMPRESSION: 1. Small left pleural effusion and probable small right pleural effusion. 2. Left lower lobe atelectasis or pneumonia. 3. Mild right basilar pneumonia, alveolar edema or patchy atelectasis. Electronically Signed   By: Claudie Revering M.D.   On: 06/16/2018 07:18    EKG: Independently reviewed.  Sinus, broad QRS, multiple PVCs, no acute ST segment changes, unchanged from prior  Assessment/Plan Active Problems:   Hypothyroidism   Essential hypertension   LBBB (left bundle branch block)   Chronic diastolic CHF (congestive heart failure) ([facility])   Community acquired pneumonia   Pneumococcal pneumonia ([place])   PAF (paroxysmal atrial fibrillation) ([place])    #) Acute hypoxic respiratory failure due to community acquired pneumonia: Currently suspect the patient most likely has community acquired pneumonia or some other infectious  process.  Due to her mental cell radiation she does have some evidence of lung fibrosis however it is not profound or particularly dramatic.  Her echo is never shown particularly elevated PA pressures.  Additionally there could be a component of diastolic heart failure however she does not appear to be particularly volume overloaded at this time. -IV ceftriaxone and doxycycline started 06/16/2018 -Follow-up blood cultures obtained 06/16/2018 -Sputum culture ordered -Urinary streptococcal antigen and Legionella antigen serogroup 1 pending  #) chronic diastolic heart failure: Grade 2 diastolic dysfunction by echo on 05/03/2018. -We will give 1 dose of IV furosemide  #) Paroxysmal atrial fibrillation: Patient is managed as an outpatient for this.  She is received an ablation about a year ago. -Continue dofetilide 250 mg twice daily -Telemetry monitoring -Continue rivaroxaban 20 mg daily -Continue beta-blocker  #) Disease hypertension/hyperlipidemia: -Continue atorvastatin 10 mg daily. -Continue carvedilol 12.5 mg twice daily -Continue losartan 50 mg twice daily  #) Hypothyroidism: -Continue levothyroxine 100 mcg every morning  #) Osteoporosis: -Continue vitamin D and calcium supplementation  Fluids: Tolerating p.o. Elect lites: Monitor and supplement Nutrition: Heart healthy diet  Prophylaxis: On rivaroxaban  Disposition: Pending improved respiratory status  Full code    Cristy Folks MD [facility]   If 7PM-7AM, please contact night-coverage [email or web address] Password [email or web address]  06/16/2018, 9:09 AM

## 2018-06-16 NOTE — ED Provider Notes (Signed)
Patient was initially seen by Dr. Kathrynn Humble.  Please see his note.  Patient presented to the emergency room with shortness of breath.  Patient was noted to be hypoxic and had a low-grade fever.  Chest x-ray was consistent with pneumonia.  No hypotension noted in the ED.  No signs of severe sepsis.  IV antibiotics were ordered.  Plan on admission for further treatment.   Dorie Rank, MD 06/16/18 [phone]

## 2018-06-16 NOTE — ED Notes (Signed)
Attempted to call report to floor 

## 2018-06-16 NOTE — ED Provider Notes (Signed)
[facility] EMERGENCY DEPARTMENT Provider Note   CSN: [id number] Arrival date & time:        History   Chief Complaint Chief Complaint  Patient presents with  . Shortness of Breath    HPI [name] is a 72 y.o. female.  HPI 72 year old female comes in with chief complaint of shortness of breath. Patient has history of CHF, paroxysmal A. fib status post ablation, remote history of breast cancer. According to patient she has been having some shortness of breath over the past couple of days.  Last night she noticed that her breathing was getting more labored, and overnight she started getting acutely short of breath and called EMS.  According to EMS, patient's O2 sats were in the upper 80s and low 90s when they arrived.  Her pressure was elevated, and since patient has history of pulmonary edema they give her nitroglycerin.  Patient has chest pain with deep inspiration and she has a nonproductive cough.  She denies any fevers or chills, however is noted to have low-grade fever in the ED.  Past Medical History:  Diagnosis Date  . Anemia   . Arthritis    fingers   . Blood transfusion    1968  . Breast cancer ([place]) 2009   T1N0M0 DCIS right breast  . Breast cancer ([place]) 2013   a. reocurrence right breast;  b. s/p Taxotere, Cytoxan and XRT  . Carotid stenosis    a. dopplers 0/35:  RICA 5-97%, LICA 41-63% => repeat due in 01/2013;  b.  Carotid US (8/45): RICA 3-64%; LICA 68-03% - f/u 6 mos  . Fracture of left patella 12/2016  . History of radiation therapy 07/10/12-08/13/12   right breast/chest wall  . Hodgkin's disease    stage 2b;  s/p XRT 1988  . HTN (hypertension)   . Hypothyroidism    2/2 XRT for Hodgkins Lymphoma  . LBBB (left bundle branch block)   . Mucositis 04/29/2012  . NICM (nonischemic cardiomyopathy) ([place])    a. EF as low as 45%;  b. LHC 5/05:  LM 10%, pLAD 30%, EF 45-50%,   c. Echo 5/13:  EF 60-65%, Gr 1 diast dysfn, MAC;   d.  Echo 11/13:   EF 60-65%, Gr 1 diast dysfn, Tr AI, mild MR;  d.  Echo 05/10/13: EF 21-22%, grade 1 diastolic dysfunction, PASP 36, small effusion.  Marland Kitchen PAF (paroxysmal atrial fibrillation) ([place])    a. dx in 2008;  b. CHADS2-VASc=5;  c. changed to [place] during admx 05/2013; d. Xarelto started 05/2013; e. s/p successful Afib ablation 06/30/2017  . PEA (Pulseless electrical activity) ([place])    a. admx 05/2013 for APE in setting of HTN emergency c/b PEA arrest and asp pneumonia, VDRF, AFib with RVR  . Raynaud's disease   . Status post radiation therapy 1988   mantle and periaortic     Patient Active Problem List   Diagnosis Date Noted  . NICM (nonischemic cardiomyopathy) ([place]) 08/15/2016  . PAF (paroxysmal atrial fibrillation) ([place]) 08/14/2016  . Cardiac arrest ([place]) 05/23/2013  . Pneumococcal pneumonia ([place]) 05/23/2013  . Pleural effusion 05/15/2013  . Carotid artery disease ([place]) 10/26/2012  . Chest wall recurrence of breast cancer ([place]) 07/09/2012  . Dyspnea 05/09/2012  . Normocytic anemia 05/09/2012  . Sinus tachycardia 04/29/2012  . Mucositis 04/29/2012  . Status post radiation therapy   . Breast mass, right 03/23/2012  . Chronic diastolic CHF (congestive heart failure) ([place]) 11/28/2011  . Carotid bruit  11/28/2011  .  Cancer of Breast T1bN0M0 triple neg S/P mastectomy/reconstruction 04/2008 11/25/2011  . Carcinoma in situ of breast 05/02/2009  . Hypothyroidism 05/02/2009  . Essential hypertension 05/02/2009  . CARDIOMYOPATHY, SECONDARY 05/02/2009  . LBBB (left bundle branch block) 05/02/2009    Past Surgical History:  Procedure Laterality Date  . ATRIAL FIBRILLATION ABLATION  06/30/2017  . ATRIAL FIBRILLATION ABLATION N/A 06/30/2017   Procedure: Atrial Fibrillation Ablation;  Surgeon: Constance Haw, MD;  Location: [facility];  Service: Cardiovascular;  Laterality: N/A;  . BREAST LUMPECTOMY  2009   right, lymph node biopsy  . BTL  1971  . DILATION AND CURETTAGE OF UTERUS    . IR  RADIOLOGY PERIPHERAL GUIDED IV START  06/23/2017  . IR US GUIDE VASC ACCESS LEFT  06/23/2017  . MASS EXCISION  03/28/2012   Procedure: EXCISION MASS;  Surgeon: Edward Jolly, MD;  Location: [facility];  Service: General;  Laterality: Right;  Excision right chest wall mass  . MASTECTOMY  2009   right breast  . POLYPECTOMY  1990   D&C (also in 1977 and [place])   . PORT-A-CATH REMOVAL  10/03/2012   Procedure: REMOVAL PORT-A-CATH;  Surgeon: Edward Jolly, MD;  Location: [facility];  Service: General;  Laterality: N/A;  . PORTACATH PLACEMENT  04/16/2012   Procedure: INSERTION PORT-A-CATH;  Surgeon: Edward Jolly, MD;  Location: [facility];  Service: General;  Laterality: Left;  Placement of Port-a-Cath, left subclavian  . R vein ligation & stripping  1976  . Spleenectomy  1988   for Hodgkin's disease  . Thoractomy  1968  . TISSUE EXPANDER  REMOVAL W/ REPLACEMENT OF IMPLANT    . TISSUE EXPANDER PLACEMENT     right breast  . TONSILLECTOMY AND ADENOIDECTOMY  1965     OB History   None      Home Medications    Prior to Admission medications   Medication Sig Start Date End Date Taking? Authorizing Provider  acetaminophen (TYLENOL) 325 MG tablet Take 650 mg by mouth at bedtime as needed for moderate pain.   Yes [provider]  atorvastatin (LIPITOR) 10 MG tablet Take 1 tablet (10 mg total) by mouth daily at 6 PM. 01/04/18  Yes Josue Hector, MD  b complex vitamins capsule Take 1 capsule by mouth daily after breakfast.   Yes [provider]  calcium carbonate (OS-CAL) 600 MG TABS Take 600 mg by mouth at bedtime.    Yes [provider]  carvedilol (COREG) 12.5 MG tablet TAKE 1 TABLET(12.5 MG) BY MOUTH TWICE DAILY WITH A MEAL 07/03/17  Yes Josue Hector, MD  Cholecalciferol (VITAMIN D) 2000 UNITS CAPS Take 2,000 Units by mouth daily with lunch.    Yes [provider]  dofetilide (TIKOSYN) 250 MCG capsule TAKE 1 CAPSULE(250 MCG) BY MOUTH  EVERY 12 HOURS 09/22/17  Yes Josue Hector, MD  levothyroxine (SYNTHROID, LEVOTHROID) 100 MCG tablet Take 100 mcg by mouth daily at 6 (six) AM.    Yes [provider]  losartan (COZAAR) 50 MG tablet Take 1 tablet (50 mg total) by mouth 2 (two) times daily. 06/20/17 06/16/18 Yes Camnitz, Will Hassell Done, MD  magnesium oxide (MAG-OX 400) 400 MG tablet Take 400 mg by mouth 2 (two) times daily.    Yes [provider]  Misc Natural Products (LUTEIN 20) CAPS Take 20 mg by mouth daily with breakfast.   Yes [provider]  Multiple Vitamin (  MULTIVITAMIN) tablet Take 1 tablet by mouth daily after breakfast. "Natural Vitamin"   Yes [provider]  OVER THE COUNTER MEDICATION BDZ tablets: Take 1 tablet by mouth at bedtime   Yes [provider]  OVER THE COUNTER MEDICATION Vegetarian Omega Green: Take 3 capsules by mouth daily with lunch   Yes [provider]  rivaroxaban (XARELTO) 20 MG TABS tablet TAKE 1 TABLET BY MOUTH DAILY WITH DINNER 06/02/17  Yes Josue Hector, MD    Family History Family History  Problem Relation Age of Onset  . Hypertension Mother   . Cancer Neg Hx     Social History Social History   Tobacco Use  . Smoking status: Never Smoker  . Smokeless tobacco: Never Used  Substance Use Topics  . Alcohol use: No  . Drug use: No     Allergies   Ace inhibitors; Benazepril; Epinephrine; Potassium-containing compounds; Prednisolone; and Taxotere [docetaxel]   Review of Systems Review of Systems  Constitutional: Positive for activity change.  Respiratory: Positive for cough and shortness of breath.   Cardiovascular: Positive for chest pain.  Allergic/Immunologic: Negative for immunocompromised state.  Hematological: Does not bruise/bleed easily.  All other systems reviewed and are negative.    Physical Exam Updated Vital Signs BP (!) 154/68   Pulse 95   Temp (!) 100.4 F (38 C) (Oral)   Resp (!) 24   LMP 03/08/1995    SpO2 95%   Physical Exam  Constitutional: She is oriented to person, place, and time. She appears well-developed.  HENT:  Head: Normocephalic and atraumatic.  Eyes: EOM are normal.  Neck: Normal range of motion. Neck supple.  Cardiovascular: Normal rate.  Pulmonary/Chest: Effort normal. No accessory muscle usage. Tachypnea noted. No respiratory distress. She has no wheezes. She has rhonchi in the right middle field and the right lower field. She has rales in the left middle field and the left lower field.  Abdominal: Bowel sounds are normal.  Neurological: She is alert and oriented to person, place, and time.  Skin: Skin is warm and dry.  Nursing note and vitals reviewed.    ED Treatments / Results  Labs (all labs ordered are listed, but only abnormal results are displayed) Labs Reviewed  CBC WITH DIFFERENTIAL/PLATELET - Abnormal; Notable for the following components:      Result Value   WBC 13.3 (*)    Neutro Abs 11.2 (*)    Monocytes Absolute 1.1 (*)    All other components within normal limits  CULTURE, BLOOD (ROUTINE X 2)  CULTURE, BLOOD (ROUTINE X 2)  BRAIN NATRIURETIC PEPTIDE  TROPONIN I  BASIC METABOLIC PANEL  URINALYSIS, ROUTINE W REFLEX MICROSCOPIC  I-STAT CG4 LACTIC ACID, ED    EKG EKG Interpretation  Date/Time:  Saturday June 16 2018 06:26:07 EDT Ventricular Rate:  97 PR Interval:    QRS Duration: 139 QT Interval:  410 QTC Calculation: 521 R Axis:   -171 Text Interpretation:  Sinus rhythm Multiple ventricular premature complexes Nonspecific intraventricular conduction delay Probable anterolateral infarct, old Minimal ST depression, lateral leads No acute changes Nonspecific ST and T wave abnormality Confirmed by Varney Biles ([id number]) on 06/16/2018 6:54:48 AM   Radiology Dg Chest Port 1 View  Result Date: 06/16/2018 CLINICAL DATA:  Shortness of breath. EXAM: PORTABLE CHEST 1 VIEW COMPARISON:  09/21/2017. FINDINGS: Normal sized heart. Ill-defined opacity  in both lower lung zones with some patchy opacity in the left lower lobe. There is also a small left pleural effusion  and possible small posteriorly layering right pleural effusion. Diffuse osteopenia. IMPRESSION: 1. Small left pleural effusion and probable small right pleural effusion. 2. Left lower lobe atelectasis or pneumonia. 3. Mild right basilar pneumonia, alveolar edema or patchy atelectasis. Electronically Signed   By: Claudie Revering M.D.   On: 06/16/2018 07:18    Procedures Procedures (including critical care time)  Medications Ordered in ED Medications  cefTRIAXone (ROCEPHIN) 2 g in sodium chloride 0.9 % 100 mL IVPB (has no administration in time range)  azithromycin (ZITHROMAX) 500 mg in sodium chloride 0.9 % 250 mL IVPB (has no administration in time range)     Initial Impression / Assessment and Plan / ED Course  I have reviewed the triage vital signs and the nursing notes.  Pertinent labs & imaging results that were available during my care of the patient were reviewed by me and considered in my medical decision making (see chart for details).     72 year old female comes in with chief complaint of shortness of breath.  She has history of pulmonary edema due to her nonischemic cardiomyopathy, and reports that overnight she started having acute shortness of breath.  On exam patient has left-sided rales and rhonchus breath sounds.  She is noted to have a low-grade fever as well, and her O2 sats are dropping in the low 90s at rest, and she is tachypneic.  X-ray confirms a pneumonia. We will admit patient for CAP because of her respiratory distress and hypoxia.   Final Clinical Impressions(s) / ED Diagnoses   Final diagnoses:  Community acquired pneumonia, unspecified laterality  Acute respiratory failure with hypoxia ([place])  Acute respiratory distress    ED Discharge Orders    None       Varney Biles, MD 06/16/18 [phone]

## 2018-06-16 NOTE — ED Triage Notes (Signed)
Pt coming from home. CC of SOB. Hx of pulmonary edema with hx of intubation. Upon EMS arrival pt was 90% on room air with labored breathing, SpO2 increased to 95% with treatment. Pt has lower lobe rales reported by EMS. Pt also has nonproductive cough. Pt BP initially 883 systolic, reduced to 254 systolic after two nitro from EMS. Pt in breast cancer remission.

## 2018-06-17 DIAGNOSIS — J9601 Acute respiratory failure with hypoxia: Secondary | ICD-10-CM

## 2018-06-17 DIAGNOSIS — R0603 Acute respiratory distress: Secondary | ICD-10-CM

## 2018-06-17 DIAGNOSIS — J189 Pneumonia, unspecified organism: Secondary | ICD-10-CM

## 2018-06-17 DIAGNOSIS — I480 Paroxysmal atrial fibrillation: Secondary | ICD-10-CM

## 2018-06-17 LAB — CBC WITH DIFFERENTIAL/PLATELET
Abs Immature Granulocytes: 0.1 10*3/uL (ref 0.0–0.1)
Basophils Absolute: 0.1 10*3/uL (ref 0.0–0.1)
Basophils Relative: 1 %
Eosinophils Absolute: 0 K/uL (ref 0.0–0.7)
Eosinophils Relative: 0 %
HCT: 39.8 % (ref 36.0–46.0)
Hemoglobin: 12.9 g/dL (ref 12.0–15.0)
Immature Granulocytes: 1 %
Lymphocytes Relative: 15 %
Lymphs Abs: 1.6 K/uL (ref 0.7–4.0)
MCH: 29.8 pg (ref 26.0–34.0)
MCHC: 32.4 g/dL (ref 30.0–36.0)
MCV: 91.9 fL (ref 78.0–100.0)
Monocytes Absolute: 2 K/uL — ABNORMAL HIGH (ref 0.1–1.0)
Monocytes Relative: 19 %
Neutro Abs: 6.9 10*3/uL (ref 1.7–7.7)
Neutrophils Relative %: 64 %
Platelets: 245 10*3/uL (ref 150–400)
RBC: 4.33 MIL/uL (ref 3.87–5.11)
RDW: 14.1 % (ref 11.5–15.5)
WBC: 10.6 10*3/uL — ABNORMAL HIGH (ref 4.0–10.5)

## 2018-06-17 LAB — BASIC METABOLIC PANEL WITH GFR
BUN: 13 mg/dL (ref 8–23)
Chloride: 102 mmol/L (ref 98–111)
Creatinine, Ser: 0.75 mg/dL (ref 0.44–1.00)
GFR calc non Af Amer: >60 mL/min (ref >60)
Glucose, Bld: 105 mg/dL — ABNORMAL HIGH (ref 70–99)
Potassium: 3.5 mmol/L (ref 3.5–5.1)
Sodium: 137 mmol/L (ref 135–145)

## 2018-06-17 LAB — HIV ANTIBODY (ROUTINE TESTING W REFLEX): HIV Screen 4th Generation wRfx: NONREACTIVE

## 2018-06-17 LAB — BASIC METABOLIC PANEL
Anion gap: 7 (ref 5–15)
CO2: 28 mmol/L (ref 22–32)
Calcium: 8.6 mg/dL — ABNORMAL LOW (ref 8.9–10.3)
GFR calc Af Amer: >60 mL/min (ref >60)

## 2018-06-17 MED ORDER — DOFETILIDE 250 MCG PO CAPS
250.0000 ug | ORAL_CAPSULE | Freq: Two times a day (BID) | ORAL | Status: DC
  Administered 2018-06-17 – 2018-06-19 (×5): 250 ug via ORAL
  Filled 2018-06-17 (×5): qty 1

## 2018-06-17 MED ORDER — FUROSEMIDE 20 MG PO TABS
20.0000 mg | ORAL_TABLET | Freq: Every day | ORAL | Status: DC
  Administered 2018-06-17 – 2018-06-19 (×3): 20 mg via ORAL
  Filled 2018-06-17 (×3): qty 1

## 2018-06-17 NOTE — Progress Notes (Signed)
Patient ID: [name], female   DOB: 1946/11/24, 72 y.o.   MRN: [id number]                                                                PROGRESS NOTE                                                                                                                                                                                                             Patient Demographics:    [name], is a 72 y.o. female, DOB - 1946/11/07, PNT:[id number]  Admit date - 06/16/2018   Admitting Physician Cristy Folks, MD  Outpatient Primary MD for the patient is [name], MD  LOS - 1  Outpatient Specialists:     Chief Complaint  Patient presents with  . Shortness of Breath       Brief Narrative    72 y.o. female with medical history significant of breast cancer status post mastectomy status post chemoradiation after recurrence, remote history of non-Hodgkin's lymphoma status post mantle radiation complicated by atrophy of the neck muscles and carotid vasculature, hypothyroidism, grade 2 chronic diastolic heart failure by echo on 05/03/2018, paroxysmal atrial fibrillation on rivaroxaban and dofetilide, hyperlipidemia, hypertension who comes in with shortness of breath and hypoxia.  Patient reports that for the past 2 days she has had increasing shortness of breath and dyspnea on exertion.  She has a chronic cough which is unchanged.  She has not had any sputum production.  She has not had any congestion or rhinorrhea.  She was seen by her PCP who gave her a inhaled bronchodilator which she reported helped somewhat but stopped helping today.  She denies any fevers but has had some chills.  She has not had any palpitations or chest pain.  She did not have any travel recently.  She has not noted any choking or difficulty swallowing.  She has not had any episodes of aspiration in the past.  EMS was called that she was quite short of breath and she was noted to be saturating in the 80s and was placed  on BiPAP.  ED Course: In the ED patient was febrile to 100.4.  She was mildly tachycardic.  She was tachypneic.  Her blood pressure was quite elevated.  She was saturating  96% on 2 L.  Her labs are notable for elevated white blood cell count of 13.3 with a left shift.  Chest x-ray showed small left pleural effusion along with small right pleural effusion and left lower lobe atelectasis or pneumonia patient was given IV ceftriaxone and azithromycin.      Subjective:    [name] today states she is feeling better. Breathing better. Afebrile overnite. Slight dry cough.   No headache, No chest pain, No abdominal pain - No Nausea, No new weakness tingling or numbness   Assessment  & Plan :    Active Problems:   Hypothyroidism   Essential hypertension   LBBB (left bundle branch block)   Chronic diastolic CHF (congestive heart failure) ([facility])   Community acquired pneumonia   Pneumococcal pneumonia ([facility])   PAF (paroxysmal atrial fibrillation) ([facility])   CAP (community acquired pneumonia)    Acute hypoxic respiratory failure CAP Blood culture x2 (06/16/2018) ngtd Urine strep negative Rocephin iv, Doxycycline iv (06/16/2018)   Chronic CHF (diastolic) Start lasix 20mg  po qday  Pafib s/p ablation Cont Tikosyn 250mg  po bid Cont carvedilol 12.5mg  po bid Cont Xarelto 20mg  po qday  Hyperlipidemia Cont Lipitor 10mg  po qhs   Hypertension Cont Losartan 50mg  po bid  Hypothyroidism Cont Levothyroxine 170micrograms po qday  Osteoporosis: Continue vitamin D and calcium supplementation       Code Status :  FULL CODE  Family Communication  : w patient  Disposition Plan  :   home  Barriers For Discharge :   Consults  :  none  Procedures  :   DVT Prophylaxis  :  Xarelto  Lab Results  Component Value Date   PLT 245 06/17/2018    Antibiotics  :  Doxycycine, Rocephin iv 6/29=>  Anti-infectives (From admission, onward)   Start     Dose/Rate Route Frequency  Ordered Stop   06/16/18 1045  doxycycline (VIBRAMYCIN) 100 mg in sodium chloride 0.9 % 250 mL IVPB     100 mg 125 mL/hr over 120 Minutes Intravenous Every 12 hours 06/16/18 1033     06/16/18 0730  cefTRIAXone (ROCEPHIN) 2 g in sodium chloride 0.9 % 100 mL IVPB     2 g 200 mL/hr over 30 Minutes Intravenous Every 24 hours 06/16/18 0722     06/16/18 0730  azithromycin (ZITHROMAX) 500 mg in sodium chloride 0.9 % 250 mL IVPB  Status:  Discontinued     500 mg 250 mL/hr over 60 Minutes Intravenous Every 24 hours 06/16/18 0722 06/16/18 1045        Objective:   Vitals:   06/16/18 1803 06/16/18 1806 06/16/18 1811 06/16/18 2342  BP: (!) 155/62   (!) 118/49  Pulse:    82  Resp: 18   20  Temp: (!) 102.7 F (39.3 C)  (!) 101.5 F (38.6 C) 98.2 F (36.8 C)  TempSrc: Oral  Oral Oral  SpO2: (!) 88% 95%  96%  Weight:      Height:        Wt Readings from Last 3 Encounters:  06/16/18 61.5 kg (135 lb 9.6 oz)  01/18/18 63 kg (139 lb)  12/28/17 61.8 kg (136 lb 4 oz)     Intake/Output Summary (Last 24 hours) at 06/17/2018 6237 Last data filed at 06/16/2018 2228 Gross per 24 hour  Intake 226 ml  Output -  Net 226 ml     Physical Exam  Awake Alert, Oriented X 3, No new F.N deficits, Normal affect [place].AT,PERRAL  Supple Neck,No JVD, No cervical lymphadenopathy appriciated.  Symmetrical Chest wall movement, Good air movement bilaterally, CTAB RRR,No Gallops,Rubs or new Murmurs, No Parasternal Heave +ve B.Sounds, Abd Soft, No tenderness, No organomegaly appriciated, No rebound - guarding or rigidity. No Cyanosis, Clubbing or edema, No new Rash or bruise     Thin elderly lady, NAD  Data Review:    CBC Recent Labs  Lab 06/16/18 0651 06/17/18 0214  WBC 13.3* 10.6*  HGB 12.6 12.9  HCT 38.8 39.8  PLT 238 245  MCV 93.5 91.9  MCH 30.4 29.8  MCHC 32.5 32.4  RDW 14.2 14.1  LYMPHSABS 0.9 1.6  MONOABS 1.1* 2.0*  EOSABS 0.0 0.0  BASOSABS 0.1 0.1    Chemistries  Recent Labs  Lab  06/16/18 0651 06/17/18 0214  NA 139 137  K 4.0 3.5  CL 104 102  CO2 23 28  GLUCOSE 128* 105*  BUN 16 13  CREATININE 0.77 0.75  CALCIUM 8.6* 8.6*   ------------------------------------------------------------------------------------------------------------------ No results for input(s): CHOL, HDL, LDLCALC, TRIG, CHOLHDL, LDLDIRECT in the last 72 hours.  No results found for: HGBA1C ------------------------------------------------------------------------------------------------------------------ No results for input(s): TSH, T4TOTAL, T3FREE, THYROIDAB in the last 72 hours.  Invalid input(s): FREET3 ------------------------------------------------------------------------------------------------------------------ No results for input(s): VITAMINB12, FOLATE, FERRITIN, TIBC, IRON, RETICCTPCT in the last 72 hours.  Coagulation profile No results for input(s): INR, PROTIME in the last 168 hours.  No results for input(s): DDIMER in the last 72 hours.  Cardiac Enzymes Recent Labs  Lab 06/16/18 0651  TROPONINI <0.03   ------------------------------------------------------------------------------------------------------------------    Component Value Date/Time   BNP 237.7 (H) 06/16/2018 4492    Inpatient Medications  Scheduled Meds: . atorvastatin  10 mg Oral q1800  . calcium carbonate  1 tablet Oral QHS  . carvedilol  12.5 mg Oral BID WC  . cholecalciferol  2,000 Units Oral Q lunch  . levothyroxine  100 mcg Oral Q0600  . losartan  50 mg Oral BID  . magnesium oxide  400 mg Oral BID  . rivaroxaban  20 mg Oral QPC supper  . sodium chloride flush  3 mL Intravenous Q12H   Continuous Infusions: . sodium chloride    . cefTRIAXone (ROCEPHIN)  IV Stopped (06/16/18 0933)  . doxycycline (VIBRAMYCIN) IV 100 mg (06/16/18 2224)   PRN Meds:.sodium chloride, acetaminophen, sodium chloride flush  Micro Results No results found for this or any previous visit (from the past 240  hour(s)).  Radiology Reports Dg Chest Port 1 View  Result Date: 06/16/2018 CLINICAL DATA:  Shortness of breath. EXAM: PORTABLE CHEST 1 VIEW COMPARISON:  09/21/2017. FINDINGS: Normal sized heart. Ill-defined opacity in both lower lung zones with some patchy opacity in the left lower lobe. There is also a small left pleural effusion and possible small posteriorly layering right pleural effusion. Diffuse osteopenia. IMPRESSION: 1. Small left pleural effusion and probable small right pleural effusion. 2. Left lower lobe atelectasis or pneumonia. 3. Mild right basilar pneumonia, alveolar edema or patchy atelectasis. Electronically Signed   By: Claudie Revering M.D.   On: 06/16/2018 07:18    Time Spent in minutes   60   Jani Gravel M.D on 06/17/2018 at 7:12 AM  Between 7am to 7pm - Pager - [phone]    After 7pm go to [email or web address] - password [facility]  [facility] -  Office  [phone]

## 2018-06-17 NOTE — Plan of Care (Signed)
  Problem: Clinical Measurements: Goal: Respiratory complications will improve Outcome: Progressing   Problem: Coping: Goal: Level of anxiety will decrease Outcome: Progressing   

## 2018-06-17 NOTE — Discharge Instructions (Signed)

## 2018-06-18 ENCOUNTER — Encounter (HOSPITAL_COMMUNITY): Payer: Self-pay | Admitting: *Deleted

## 2018-06-18 LAB — COMPREHENSIVE METABOLIC PANEL
ALT: 20 U/L (ref 0–44)
AST: 21 U/L (ref 15–41)
Albumin: 3 g/dL — ABNORMAL LOW (ref 3.5–5.0)
Alkaline Phosphatase: 70 U/L (ref 38–126)
Anion gap: 10 (ref 5–15)
BUN: 20 mg/dL (ref 8–23)
CHLORIDE: 103 mmol/L (ref 98–111)
CO2: 26 mmol/L (ref 22–32)
Calcium: 8.9 mg/dL (ref 8.9–10.3)
Creatinine, Ser: 0.74 mg/dL (ref 0.44–1.00)
GFR calc Af Amer: >60 mL/min (ref >60)
GFR calc non Af Amer: >60 mL/min (ref >60)
Glucose, Bld: 95 mg/dL (ref 70–99)
Potassium: 4.2 mmol/L (ref 3.5–5.1)
SODIUM: 139 mmol/L (ref 135–145)
Total Bilirubin: 0.6 mg/dL (ref 0.3–1.2)
Total Protein: 5.8 g/dL — ABNORMAL LOW (ref 6.5–8.1)

## 2018-06-18 LAB — CBC
HCT: 38 % (ref 36.0–46.0)
Hemoglobin: 12.1 g/dL (ref 12.0–15.0)
MCH: 29.4 pg (ref 26.0–34.0)
MCHC: 31.8 g/dL (ref 30.0–36.0)
MCV: 92.2 fL (ref 78.0–100.0)
PLATELETS: 248 10*3/uL (ref 150–400)
RBC: 4.12 MIL/uL (ref 3.87–5.11)
RDW: 14 % (ref 11.5–15.5)
WBC: 8.4 10*3/uL (ref 4.0–10.5)

## 2018-06-18 LAB — LEGIONELLA PNEUMOPHILA SEROGP 1 UR AG: L. pneumophila Serogp 1 Ur Ag: NEGATIVE

## 2018-06-18 MED ORDER — PRO-STAT SUGAR FREE PO LIQD
30.0000 mL | Freq: Two times a day (BID) | ORAL | Status: DC
  Administered 2018-06-18 – 2018-06-19 (×3): 30 mL via ORAL
  Filled 2018-06-18 (×3): qty 30

## 2018-06-18 NOTE — Progress Notes (Addendum)
Patient ID: [name], female   DOB: 09-Oct-1946, 72 y.o.   MRN: [id number]                                                                PROGRESS NOTE                                                                                                                                                                                                             Patient Demographics:    [name], is a 72 y.o. female, DOB - 26-Dec-1945, ZTI:[id number]  Admit date - 06/16/2018   Admitting Physician Cristy Folks, MD  Outpatient Primary MD for the patient is [name], MD  LOS - 2  Outpatient Specialists: Will Inniswold  Chief Complaint  Patient presents with  . Shortness of Breath       Brief Narrative 72 y.o.femalewith medical history significant ofbreast cancer status post mastectomy status post chemoradiation after recurrence, remote history of non-Hodgkin's lymphoma status post mantle radiation complicated by atrophy of the neck muscles and carotid vasculature, hypothyroidism, grade 2 chronic diastolic heart failure by echo on 05/03/2018, paroxysmal atrial fibrillation on rivaroxaban and dofetilide, hyperlipidemia, hypertension who comes in with shortness of breath and hypoxia. Patient reports that for the past 2 days she has had increasing shortness of breath and dyspnea on exertion. She has a chronic cough which is unchanged. She has not had any sputum production. She has not had any congestion or rhinorrhea. She was seen by her PCP who gave her a inhaled bronchodilator which she reported helped somewhat but stopped helping today. She denies any fevers but has had some chills. She has not had any palpitations or chest pain. She did not have any travel recently. She has not noted any choking or difficulty swallowing. She has not had any episodes of aspiration in the past. EMS was called that she was quite short of breath and she was noted to be saturating in the 80s and  was placed on BiPAP.  ED Course:In the ED patient was febrile to 100.4. She was mildly tachycardic. She was tachypneic. Her blood pressure was quite elevated. She was saturating 96% on 2 L. Her labs are notable for elevated white blood cell count of 13.3 with a left shift. Chest x-ray showed  small left pleural effusion along with small right pleural effusion and left lower lobe atelectasis or pneumonia patient was given IV ceftriaxone and azithromycin.      Subjective:    [name] today states that breathing 80% better.   No headache, No chest pain, No abdominal pain - No Nausea, No new weakness tingling or numbness, trace dry cough.    Assessment  & Plan :    Active Problems:   Hypothyroidism   Essential hypertension   LBBB (left bundle branch block)   Chronic diastolic CHF (congestive heart failure) (HCC)   Community acquired pneumonia   Pneumococcal pneumonia (HCC)   PAF (paroxysmal atrial fibrillation) (HCC)   CAP (community acquired pneumonia)   Acute respiratory failure with hypoxia ([facility])    Acute hypoxic respiratory failure CAP Blood culture x2 (06/16/2018) ngtd Urine strep negative Rocephin iv, Doxycycline iv (06/16/2018)   Chronic CHF (diastolic) Start lasix 20mg  po qday  Pafib s/p ablation Cont Tikosyn 250mg  po bid Cont carvedilol 12.5mg  po bid Cont Xarelto 20mg  po qday  Hyperlipidemia Cont Lipitor 10mg  po qhs   Hypertension Cont Losartan 50mg  po bid  Hypothyroidism Cont Levothyroxine 184micrograms po qday  Osteoporosis: Continue vitamin D and calcium supplementation       Code Status :  FULL CODE  Family Communication  : w patient  Disposition Plan  :   home tomorrow AM  Barriers For Discharge :   Consults  :  none  Procedures  :   DVT Prophylaxis  :  Xarelto      Lab Results  Component Value Date   PLT 248 06/18/2018    Antibiotics  :  Doxycycine, Rocephin iv 6/29=>    Anti-infectives  (From admission, onward)   Start     Dose/Rate Route Frequency Ordered Stop   06/16/18 1045  doxycycline (VIBRAMYCIN) 100 mg in sodium chloride 0.9 % 250 mL IVPB     100 mg 125 mL/hr over 120 Minutes Intravenous Every 12 hours 06/16/18 1033     06/16/18 0730  cefTRIAXone (ROCEPHIN) 2 g in sodium chloride 0.9 % 100 mL IVPB     2 g 200 mL/hr over 30 Minutes Intravenous Every 24 hours 06/16/18 0722     06/16/18 0730  azithromycin (ZITHROMAX) 500 mg in sodium chloride 0.9 % 250 mL IVPB  Status:  Discontinued     500 mg 250 mL/hr over 60 Minutes Intravenous Every 24 hours 06/16/18 0722 06/16/18 1045        Objective:   Vitals:   06/16/18 2342 06/17/18 0832 06/17/18 1602 06/18/18 0003  BP: (!) 118/49 (!) 114/42 116/62 (!) 151/69  Pulse: 82 90 78 80  Resp: 20 18 18 16   Temp: 98.2 F (36.8 C) 98.4 F (36.9 C) 98.4 F (36.9 C) 98.2 F (36.8 C)  TempSrc: Oral Oral Oral Oral  SpO2: 96% 92% 96% 94%  Weight:      Height:        Wt Readings from Last 3 Encounters:  06/16/18 61.5 kg (135 lb 9.6 oz)  01/18/18 63 kg (139 lb)  12/28/17 61.8 kg (136 lb 4 oz)     Intake/Output Summary (Last 24 hours) at 06/18/2018 0736 Last data filed at 06/18/2018 0328 Gross per 24 hour  Intake 1060 ml  Output -  Net 1060 ml     Physical Exam  Awake Alert, Oriented X 3, No new F.N deficits, Normal affect [place].AT,PERRAL Supple Neck,No JVD, No cervical lymphadenopathy appriciated.  Symmetrical Chest wall  movement, Good air movement bilaterally, trace crackles right lung base, no wheezing.  RRR,No Gallops,Rubs or new Murmurs, No Parasternal Heave +ve B.Sounds, Abd Soft, No tenderness, No organomegaly appriciated, No rebound - guarding or rigidity. No Cyanosis, Clubbing or edema, No new Rash or bruise       Data Review:    CBC Recent Labs  Lab 06/16/18 0651 06/17/18 0214 06/18/18 0151  WBC 13.3* 10.6* 8.4  HGB 12.6 12.9 12.1  HCT 38.8 39.8 38.0  PLT 238 245 248  MCV 93.5 91.9 92.2  MCH  30.4 29.8 29.4  MCHC 32.5 32.4 31.8  RDW 14.2 14.1 14.0  LYMPHSABS 0.9 1.6  --   MONOABS 1.1* 2.0*  --   EOSABS 0.0 0.0  --   BASOSABS 0.1 0.1  --     Chemistries  Recent Labs  Lab 06/16/18 0651 06/17/18 0214 06/18/18 0151  NA 139 137 139  K 4.0 3.5 4.2  CL 104 102 103  CO2 23 28 26   GLUCOSE 128* 105* 95  BUN 16 13 20   CREATININE 0.77 0.75 0.74  CALCIUM 8.6* 8.6* 8.9  AST  --   --  21  ALT  --   --  20  ALKPHOS  --   --  70  BILITOT  --   --  0.6   ------------------------------------------------------------------------------------------------------------------ No results for input(s): CHOL, HDL, LDLCALC, TRIG, CHOLHDL, LDLDIRECT in the last 72 hours.  No results found for: HGBA1C ------------------------------------------------------------------------------------------------------------------ No results for input(s): TSH, T4TOTAL, T3FREE, THYROIDAB in the last 72 hours.  Invalid input(s): FREET3 ------------------------------------------------------------------------------------------------------------------ No results for input(s): VITAMINB12, FOLATE, FERRITIN, TIBC, IRON, RETICCTPCT in the last 72 hours.  Coagulation profile No results for input(s): INR, PROTIME in the last 168 hours.  No results for input(s): DDIMER in the last 72 hours.  Cardiac Enzymes Recent Labs  Lab 06/16/18 0651  TROPONINI <0.03   ------------------------------------------------------------------------------------------------------------------    Component Value Date/Time   BNP 237.7 (H) 06/16/2018 8333    Inpatient Medications  Scheduled Meds: . atorvastatin  10 mg Oral q1800  . calcium carbonate  1 tablet Oral QHS  . carvedilol  12.5 mg Oral BID WC  . cholecalciferol  2,000 Units Oral Q lunch  . dofetilide  250 mcg Oral BID  . feeding supplement (PRO-STAT SUGAR FREE 64)  30 mL Oral BID  . furosemide  20 mg Oral Daily  . levothyroxine  100 mcg Oral Q0600  . losartan  50 mg  Oral BID  . magnesium oxide  400 mg Oral BID  . rivaroxaban  20 mg Oral QPC supper  . sodium chloride flush  3 mL Intravenous Q12H   Continuous Infusions: . sodium chloride    . cefTRIAXone (ROCEPHIN)  IV Stopped (06/17/18 1007)  . doxycycline (VIBRAMYCIN) IV Stopped (06/18/18 0700)   PRN Meds:.sodium chloride, acetaminophen, sodium chloride flush  Micro Results Recent Results (from the past 240 hour(s))  Blood Culture (routine x 2)     Status: None (Preliminary result)   Collection Time: 06/16/18  8:26 AM  Result Value Ref Range Status   Specimen Description BLOOD LEFT ANTECUBITAL  Final   Special Requests   Final    BOTTLES DRAWN AEROBIC AND ANAEROBIC Blood Culture adequate volume   Culture   Final    NO GROWTH 1 DAY Performed at [facility], [place] [address] [place], [place] [address]    Report Status PENDING  Incomplete  Blood Culture (routine x 2)  Status: None (Preliminary result)   Collection Time: 06/16/18  8:41 AM  Result Value Ref Range Status   Specimen Description BLOOD LEFT HAND  Final   Special Requests   Final    BOTTLES DRAWN AEROBIC AND ANAEROBIC Blood Culture results may not be optimal due to an inadequate volume of blood received in culture bottles   Culture   Final    NO GROWTH 1 DAY Performed at [facility], [place] [address] [place], [place] [address]    Report Status PENDING  Incomplete    Radiology Reports Dg Chest Port 1 View  Result Date: 06/16/2018 CLINICAL DATA:  Shortness of breath. EXAM: PORTABLE CHEST 1 VIEW COMPARISON:  09/21/2017. FINDINGS: Normal sized heart. Ill-defined opacity in both lower lung zones with some patchy opacity in the left lower lobe. There is also a small left pleural effusion and possible small posteriorly layering right pleural effusion. Diffuse osteopenia. IMPRESSION: 1. Small left pleural effusion and probable small right pleural effusion. 2. Left lower lobe atelectasis or pneumonia. 3. Mild right basilar  pneumonia, alveolar edema or patchy atelectasis. Electronically Signed   By: Claudie Revering M.D.   On: 06/16/2018 07:18    Time Spent in minutes  60   Jani Gravel M.D on 06/18/2018 at 7:36 AM  Between 7am to 7pm - Pager - [phone]    After 7pm go to [email or web address] - password [facility]  [facility] -  Office  [phone]

## 2018-06-19 ENCOUNTER — Encounter: Payer: Self-pay | Admitting: Cardiology

## 2018-06-19 ENCOUNTER — Ambulatory Visit (INDEPENDENT_AMBULATORY_CARE_PROVIDER_SITE_OTHER): Payer: Medicare Other | Admitting: Cardiology

## 2018-06-19 VITALS — BP 124/78 | HR 78 | Ht 61.0 in | Wt 134.4 lb

## 2018-06-19 DIAGNOSIS — I10 Essential (primary) hypertension: Secondary | ICD-10-CM | POA: Diagnosis not present

## 2018-06-19 DIAGNOSIS — Z79899 Other long term (current) drug therapy: Secondary | ICD-10-CM

## 2018-06-19 DIAGNOSIS — I480 Paroxysmal atrial fibrillation: Secondary | ICD-10-CM | POA: Diagnosis not present

## 2018-06-19 DIAGNOSIS — I6523 Occlusion and stenosis of bilateral carotid arteries: Secondary | ICD-10-CM

## 2018-06-19 DIAGNOSIS — I5032 Chronic diastolic (congestive) heart failure: Secondary | ICD-10-CM

## 2018-06-19 DIAGNOSIS — I493 Ventricular premature depolarization: Secondary | ICD-10-CM | POA: Diagnosis not present

## 2018-06-19 MED ORDER — DOXYCYCLINE HYCLATE 100 MG PO TABS
100.0000 mg | ORAL_TABLET | Freq: Two times a day (BID) | ORAL | Status: DC
  Administered 2018-06-19: 100 mg via ORAL
  Filled 2018-06-19: qty 1

## 2018-06-19 MED ORDER — PRO-STAT SUGAR FREE PO LIQD: 30.0000 mL | Freq: Two times a day (BID) | ORAL | 0 refills | Status: DC

## 2018-06-19 MED ORDER — DOXYCYCLINE HYCLATE 100 MG PO CAPS: 100.0000 mg | ORAL_CAPSULE | Freq: Two times a day (BID) | ORAL | 0 refills | Status: DC

## 2018-06-19 NOTE — Discharge Summary (Signed)
[name], is a 72 y.o. female  DOB 1946-04-01  MRN [id number].  Admission date:  06/16/2018  Admitting Physician  Cristy Folks, MD  Discharge Date:  06/19/2018   Primary MD  [name], MD  Recommendations for primary care physician for things to follow:     Acute hypoxic respiratory failure CAP Blood culture x2 (06/16/2018) ngtd Urine strep negative Urine legionella negative Doxycycline 173m po bid x 7 days Check cbc , cmp in 1 week PCP to please repeat CXR in 2-4 weeks ensure resolution of infiltrate  Chronic CHF (diastolic) per admitting physician,  More likely her BNP was elevated due to CAP  Pafib s/p ablation Cont Tikosyn 2519mpo bid Cont carvedilol 12.44m68mo bid Cont Xarelto 19m8m qday  Hyperlipidemia Cont Lipitor 10mg64mqhs  Hypertension Cont Losartan 50mg 53mid  Hypothyroidism Cont Levothyroxine 100micr62mms po qday  Osteoporosis: Continue vitamin D and calcium supplementation  Severe protein calorie malnutrition prostat 30 mL po bid     Admission Diagnosis  Acute respiratory distress [R06.03] Acute respiratory failure with hypoxia (HCC) [JDonnelly01] Community acquired pneumonia, unspecified laterality [J18.9]   Discharge Diagnosis  Acute respiratory distress [R06.03] Acute respiratory failure with hypoxia (HCC) [JBuffalo01] Community acquired pneumonia, unspecified laterality [J18.9]     Active Problems:   Hypothyroidism   Essential hypertension   LBBB (left bundle branch block)   Chronic diastolic CHF (congestive heart failure) (HCC)   [place] acquired pneumonia   Pneumococcal pneumonia (HCC)   [place] (paroxysmal atrial fibrillation) (HCC)   [place] (community acquired pneumonia)   Acute respiratory failure with hypoxia (HCC)   [place]:  Diagnosis Date  . Anemia   . Arthritis    fingers   . Blood transfusion    1968  . Breast cancer ([place]CC) [place]  T1N0M0 DCIS right breast  . Breast cancer (HCC) [place]  a. reocurrence right breast;  b. s/p Taxotere, Cytoxan and XRT  . Carotid stenosis    a. dopplers 8/13:  5/63 0-39%, 8-93%60-79% 73-42%eat due in 01/2013;  b.  Carotid US (9/1[place]: 8/76 1-39%; 8-11%60-79% 57-26%6 mos  . Fracture of left patella 12/2016  . History of radiation therapy 07/10/12-08/13/12   right breast/chest wall  . Hodgkin's disease    stage 2b;  s/p XRT 1988  . HTN (hypertension)   . Hypothyroidism    2/2 XRT for Hodgkins Lymphoma  . LBBB (left bundle branch block)   . Mucositis 04/29/2012  . NICM (nonischemic cardiomyopathy) (HCC)   [place] EF as low as 45%;  b. LHC 5/05:  LM 10%, pLAD 30%, EF 45-50%,   c. Echo 5/13:  EF 60-65%, Gr 1 diast dysfn, MAC;   d.  Echo 11/13:  EF 60-65%, Gr 1 diast dysfn, Tr AI, mild MR;  d.  Echo 05/10/13: EF 55-60%,20-35% 1 diastolic dysfunction, PASP 36, small effusion.  . PAF ([place]oxysmal atrial fibrillation) (HCC)   [place]  dx in 2008;  b. CHADS2-VASc=5;  c. changed to [place] during admx 05/2013; d. Xarelto started 05/2013; e. s/p successful Afib ablation 06/30/2017  . PEA (Pulseless electrical activity) ([place])    a. admx 05/2013 for APE in setting of HTN emergency c/b PEA arrest and asp pneumonia, VDRF, AFib with RVR  . Raynaud's disease   . Status post radiation therapy 1988   mantle and periaortic     Past Surgical History:  Procedure Laterality Date  . ATRIAL FIBRILLATION ABLATION  06/30/2017  . ATRIAL FIBRILLATION ABLATION N/A 06/30/2017   Procedure: Atrial Fibrillation Ablation;  Surgeon: Constance Haw, MD;  Location: [facility];  Service: Cardiovascular;  Laterality: N/A;  . BREAST LUMPECTOMY  2009   right, lymph node biopsy  . BTL  1971  . DILATION AND CURETTAGE OF UTERUS    . IR RADIOLOGY PERIPHERAL GUIDED IV START  06/23/2017  . IR US GUIDE VASC ACCESS LEFT  06/23/2017  . MASS EXCISION  03/28/2012   Procedure: EXCISION MASS;  Surgeon:  Edward Jolly, MD;  Location: [facility];  Service: General;  Laterality: Right;  Excision right chest wall mass  . MASTECTOMY  2009   right breast  . POLYPECTOMY  1990   D&C (also in 1977 and [place])   . PORT-A-CATH REMOVAL  10/03/2012   Procedure: REMOVAL PORT-A-CATH;  Surgeon: Edward Jolly, MD;  Location: [facility];  Service: General;  Laterality: N/A;  . PORTACATH PLACEMENT  04/16/2012   Procedure: INSERTION PORT-A-CATH;  Surgeon: Edward Jolly, MD;  Location: [facility];  Service: General;  Laterality: Left;  Placement of Port-a-Cath, left subclavian  . R vein ligation & stripping  1976  . Spleenectomy  1988   for Hodgkin's disease  . Thoractomy  1968  . TISSUE EXPANDER  REMOVAL W/ REPLACEMENT OF IMPLANT    . TISSUE EXPANDER PLACEMENT     right breast  . TONSILLECTOMY AND ADENOIDECTOMY  1965       HPI  from the history and physical done on the day of admission:     72 y.o.femalewith medical history significant ofbreast cancer status post mastectomy status post chemoradiation after recurrence, remote history of non-Hodgkin's lymphoma status post mantle radiation complicated by atrophy of the neck muscles and carotid vasculature, hypothyroidism, grade 2 chronic diastolic heart failure by echo on 05/03/2018, paroxysmal atrial fibrillation on rivaroxaban and dofetilide, hyperlipidemia, hypertension who comes in with shortness of breath and hypoxia. Patient reports that for the past 2 days she has had increasing shortness of breath and dyspnea on exertion. She has a chronic cough which is unchanged. She has not had any sputum production. She has not had any congestion or rhinorrhea. She was seen by her PCP who gave her a inhaled bronchodilator which she reported helped somewhat but stopped helping today. She denies any fevers but has had some chills. She has not had any palpitations or chest pain. She did not have any travel recently. She has not noted any  choking or difficulty swallowing. She has not had any episodes of aspiration in the past. EMS was called that she was quite short of breath and she was noted to be saturating in the 80s and was placed on BiPAP.  ED Course:In the ED patient was febrile to 100.4. She was mildly tachycardic. She was tachypneic. Her blood pressure was quite elevated. She was saturating 96% on 2 L. Her labs are notable for elevated white blood cell count of  13.3 with a left shift. Chest x-ray showed small left pleural effusion along with small right pleural effusion and left lower lobe atelectasis or pneumonia patient was given IV ceftriaxone and azithromycin.        Hospital Course:      Pt was admitted for cap, and started on rocephin and doxycycline.  Pt has been afebrile since admission.  Her wbc has normalized.  Wbc 13.3 (6/29)=> 8.4 (7/1),  Pt noted to have albumin of 3.0 and started on prostat.  Blood culture 6/29 => negative Sputum culture=> no growth as present time Urine strep antigen => negative Urine legionella antigen => negative.  BNP 237, pt given lasix iv x1 in ED. Pt was started on lasix 35m po qday for small right and left pleural effusion. Doubt CHF,  BNP elevation more likely related to pneumonia.    Pt has been afebrile , and cough and dyspnea resolved.  Pt is oxygenating well and requests discharge today so she can follow up with her cardiologist.     Follow UP  Follow-up Information    EGaynelle Arabian MD Follow up in 1 week(s).   Specialty:  Family Medicine Contact information: [phone]        CConstance Haw MD Follow up on 06/19/2018.   Specialty:  Cardiology Contact information: [phone]           Consults obtained - none  Discharge Condition: stable  Diet and Activity recommendation: See Discharge Instructions below  Discharge  Instructions         Discharge Medications     Allergies as of 06/19/2018      Reactions   Ace Inhibitors Cough   Benazepril Cough   Epinephrine Other (See Comments)   Feels "buzzed" and heart rate increases   Potassium-containing Compounds    Projectile vomiting--liquid potassium    Prednisolone Other (See Comments)   Arrhythmia(s)   Taxotere [docetaxel] Other (See Comments)   Fluid overload      Medication List    TAKE these medications   acetaminophen 325 MG tablet Commonly known as:  TYLENOL Take 650 mg by mouth at bedtime as needed for moderate pain.   atorvastatin 10 MG tablet Commonly known as:  LIPITOR Take 1 tablet (10 mg total) by mouth daily at 6 PM.   b complex vitamins capsule Take 1 capsule by mouth daily after breakfast.   calcium carbonate 600 MG Tabs tablet Commonly known as:  OS-CAL Take 600 mg by mouth at bedtime.   carvedilol 12.5 MG tablet Commonly known as:  COREG TAKE 1 TABLET(12.5 MG) BY MOUTH TWICE DAILY WITH A MEAL   dofetilide 250 MCG capsule Commonly known as:  TIKOSYN TAKE 1 CAPSULE(250 MCG) BY MOUTH EVERY 12 HOURS   doxycycline 100 MG capsule Commonly known as:  VIBRAMYCIN Take 1 capsule (100 mg total) by mouth 2 (two) times daily.   feeding supplement (PRO-STAT SUGAR FREE 64) Liqd Take 30 mLs by mouth 2 (two) times daily.   levothyroxine 100 MCG tablet Commonly known as:  SYNTHROID, LEVOTHROID Take 100 mcg by mouth daily at 6 (six) AM.   losartan 50 MG tablet Commonly known as:  COZAAR Take 1 tablet (50 mg total) by mouth 2 (two) times daily.   LUTEIN 20 Caps Take 20 mg by mouth daily with breakfast.   MAG-OX 400 400 MG tablet Generic drug:  magnesium  oxide Take 400 mg by mouth 2 (two) times daily.   multivitamin tablet Take 1 tablet by mouth daily after breakfast. "Natural Vitamin"   OVER THE COUNTER MEDICATION BDZ tablets: Take 1 tablet by mouth at bedtime   OVER THE COUNTER MEDICATION Vegetarian Omega Green:  Take 3 capsules by mouth daily with lunch   rivaroxaban 20 MG Tabs tablet Commonly known as:  XARELTO TAKE 1 TABLET BY MOUTH DAILY WITH DINNER   Vitamin D 2000 units Caps Take 2,000 Units by mouth daily with lunch.       Major procedures and Radiology Reports - PLEASE review detailed and final reports for all details, in brief -       Dg Chest Port 1 View  Result Date: 06/16/2018 CLINICAL DATA:  Shortness of breath. EXAM: PORTABLE CHEST 1 VIEW COMPARISON:  09/21/2017. FINDINGS: Normal sized heart. Ill-defined opacity in both lower lung zones with some patchy opacity in the left lower lobe. There is also a small left pleural effusion and possible small posteriorly layering right pleural effusion. Diffuse osteopenia. IMPRESSION: 1. Small left pleural effusion and probable small right pleural effusion. 2. Left lower lobe atelectasis or pneumonia. 3. Mild right basilar pneumonia, alveolar edema or patchy atelectasis. Electronically Signed   By: Claudie Revering M.D.   On: 06/16/2018 07:18    Micro Results      Recent Results (from the past 240 hour(s))  Blood Culture (routine x 2)     Status: None (Preliminary result)   Collection Time: 06/16/18  8:26 AM  Result Value Ref Range Status   Specimen Description BLOOD LEFT ANTECUBITAL  Final   Special Requests   Final    BOTTLES DRAWN AEROBIC AND ANAEROBIC Blood Culture adequate volume   Culture   Final    NO GROWTH 2 DAYS Performed at [facility], [address] [place], [place] [address]    Report Status PENDING  Incomplete  Blood Culture (routine x 2)     Status: None (Preliminary result)   Collection Time: 06/16/18  8:41 AM  Result Value Ref Range Status   Specimen Description BLOOD LEFT HAND  Final   Special Requests   Final    BOTTLES DRAWN AEROBIC AND ANAEROBIC Blood Culture results may not be optimal due to an inadequate volume of blood received in culture bottles   Culture   Final    NO GROWTH 2 DAYS Performed at  [facility], [place] [address], [place], [place] [address]    Report Status PENDING  Incomplete       Today   Subjective    [name] today is feeling well.  Afebrile.  Denies cough, dyspnea.   No headache,no chest pain, no abdominal pain,no new weakness tingling or numbness, feels much better wants to go home today.   Objective   Blood pressure (!) 142/70, pulse 78, temperature 98.6 F (37 C), temperature source Oral, resp. rate 18, height _0  (1.549 m), weight 61.5 kg (135 lb 9.6 oz), last menstrual period 03/08/1995, SpO2 96 %.   Intake/Output Summary (Last 24 hours) at 06/19/2018 0703 Last data filed at 06/18/2018 2216 Gross per 24 hour  Intake 710 ml  Output -  Net 710 ml    Exam Awake Alert, Oriented x 3, No new F.N deficits, Normal affect .AT,PERRAL Supple Neck,No JVD, No cervical lymphadenopathy appriciated.  Symmetrical Chest wall movement, Good air movement bilaterally, CTAB RRR,No Gallops,Rubs or new Murmurs, No Parasternal Heave +ve B.Sounds,  Abd Soft, Non tender, No organomegaly appriciated, No rebound -guarding or rigidity. No Cyanosis, Clubbing or edema, No new Rash or bruise   Data Review   CBC w Diff:  Lab Results  Component Value Date   WBC 8.4 06/18/2018   HGB 12.1 06/18/2018   HGB 13.2 06/20/2017   HGB 13.0 10/08/2015   HCT 38.0 06/18/2018   HCT 40.1 06/20/2017   HCT 40.0 10/08/2015   PLT 248 06/18/2018   PLT 325 06/20/2017   LYMPHOPCT 15 06/17/2018   LYMPHOPCT 20.4 10/08/2015   BANDSPCT 0 04/30/2012   MONOPCT 19 06/17/2018   MONOPCT 10.1 10/08/2015   EOSPCT 0 06/17/2018   EOSPCT 4.3 10/08/2015   BASOPCT 1 06/17/2018   BASOPCT 1.7 10/08/2015    CMP:  Lab Results  Component Value Date   NA 139 06/18/2018   NA 141 10/11/2017   NA 143 10/08/2015   K 4.2 06/18/2018   K 4.8 10/08/2015   CL 103 06/18/2018   CL 104 09/05/2012   CO2 26 06/18/2018   CO2 31 (H) 10/08/2015   BUN 20 06/18/2018   BUN 22 10/11/2017   BUN  19.8 10/08/2015   CREATININE 0.74 06/18/2018   CREATININE 0.8 10/08/2015   PROT 5.8 (L) 06/18/2018   PROT 6.5 10/08/2015   ALBUMIN 3.0 (L) 06/18/2018   ALBUMIN 3.7 10/08/2015   BILITOT 0.6 06/18/2018   BILITOT 0.39 10/08/2015   ALKPHOS 70 06/18/2018   ALKPHOS 108 10/08/2015   AST 21 06/18/2018   AST 17 10/08/2015   ALT 20 06/18/2018   ALT 16 10/08/2015  .   Total Time in preparing paper work, data evaluation and todays exam - 25 minutes  Jani Gravel M.D on 06/19/2018 at 7:03 AM  [facility]   Office  [phone]

## 2018-06-19 NOTE — Consult Note (Signed)
            [facility] Primary Care Navigator  06/19/2018  [name] 03-17-1946 [id number]   Went to seepatient at the bedside toidentify possible discharge needs. Patientreports"couldn't breath deep"- shortness of breath and dyspnea on exertion that ledto thisadmission.  Patient endorses Dr. Gaynelle [name] with [facility] as her primary care provider.   Patient [name] on [employer] to obtain medications without difficulty.   Patient reportsmanagingherown medicationsat homeusing "pill box" system filled every 2 weeks.  Patientmentioned thatshe has been driving prior to admission, but son [name]) will provide transportationto her doctors' appointmentsif needed after discharge.  Patient livesalone but her sister -[name] (from [place]) will be staying with her at home for few days. She reports having her son and 2 RN neighbors who can assist with care needs if needed at home.  Anticipated dischargeplanis home per patient.  Patientvoicedunderstanding to call primary care provider's office for a post discharge follow-up appointment within 1- 2 weeksor sooner if needs arise.Patient letter (with PCP's contact number) was provided as areminder.  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 She denies having any pressing issues with health (HF) and "managing well so far".  Patienthowever, had optedand verbally agreedforEMMI Pneumoniacalls tofollow-up recovery.  Referralmade forEMMI Pneumonia Calls after discharge.  Patient expressed understandingto seekreferral to [facility] care managementfrom primary care provider ifdeemed necessary andappropriateforanyservices in thefuture.  [facility] care management information provided for future needs  thatshemay have.    For additional questions please contact:  [name], BSN, RN-BC [facility] PRIMARY CARE Navigator Cell: [phone]

## 2018-06-19 NOTE — Progress Notes (Signed)
Electrophysiology Office Note   Date:  06/19/2018   ID:  [name], [name] 11-27-1946, MRN [id number]  PCP:  Patricia Arabian, MD  Cardiologist:  [name] Primary Electrophysiologist:  Patricia Dekoning Meredith Leeds, MD    Chief Complaint  Patient presents with  . Follow-up    PAF/PVC's     History of Present Illness: [name] is a 72 y.o. female who is being seen today for the evaluation of atrial fibrillation at the request of Patricia Arabian, MD. Presenting today for electrophysiology evaluation. Has a history of nonischemic cardio myopathy with an EF that has since improved, diastolic heart failure, hypertension, left bundle branch block, hypothyroidism, Hodgkin's disease, breast cancer, and carotid stenosis.  Radiation therapy for Hodgkin's lymphoma in 1998, mastectomy in 2009 and recurrent breast cancer in 2013. EF 45% in the past which is since improved. Flecainide was started in 2013 for recurrent atrial fibrillation. Treadmill test was negative for arrhythmia. Admitted for pulmonary edema on 5/14 in the setting of hypertensive crisis compensated by PEA arrest. Currently on Tikosyn. Had AF ablation 06/30/17.  She was admitted to the hospital and discharged 06/19/2018 with community-acquired pneumonia.  She has not had further episodes of atrial fibrillation.  She was in sinus rhythm during her hospitalization.  Today, denies symptoms of palpitations, chest pain, shortness of breath, orthopnea, PND, lower extremity edema, claudication, dizziness, presyncope, syncope, bleeding, or neurologic sequela. The patient is tolerating medications without difficulties.    Past Medical History:  Diagnosis Date  . Anemia   . Arthritis    fingers   . Blood transfusion    1968  . Breast cancer ([place]) 2009   T1N0M0 DCIS right breast  . Breast cancer ([place]) 2013   a. reocurrence right breast;  b. s/p Taxotere, Cytoxan and XRT  . Carotid stenosis    a. dopplers 9/81:  RICA 1-91%, LICA 47-82% =>  repeat due in 01/2013;  b.  Carotid US (9/56): RICA 2-13%; LICA 08-65% - f/u 6 mos  . Fracture of left patella 12/2016  . History of radiation therapy 07/10/12-08/13/12   right breast/chest wall  . Hodgkin's disease    stage 2b;  s/p XRT 1988  . HTN (hypertension)   . Hypothyroidism    2/2 XRT for Hodgkins Lymphoma  . LBBB (left bundle branch block)   . Mucositis 04/29/2012  . NICM (nonischemic cardiomyopathy) ([place])    a. EF as low as 45%;  b. LHC 5/05:  LM 10%, pLAD 30%, EF 45-50%,   c. Echo 5/13:  EF 60-65%, Gr 1 diast dysfn, MAC;   d.  Echo 11/13:  EF 60-65%, Gr 1 diast dysfn, Tr AI, mild MR;  d.  Echo 05/10/13: EF 78-46%, grade 1 diastolic dysfunction, PASP 36, small effusion.  Marland Kitchen PAF (paroxysmal atrial fibrillation) ([place])    a. dx in 2008;  b. CHADS2-VASc=5;  c. changed to [place] during admx 05/2013; d. Xarelto started 05/2013; e. s/p successful Afib ablation 06/30/2017  . PEA (Pulseless electrical activity) ([place])    a. admx 05/2013 for APE in setting of HTN emergency c/b PEA arrest and asp pneumonia, VDRF, AFib with RVR  . Raynaud's disease   . Status post radiation therapy 1988   mantle and periaortic    Past Surgical History:  Procedure Laterality Date  . ATRIAL FIBRILLATION ABLATION  06/30/2017  . ATRIAL FIBRILLATION ABLATION N/A 06/30/2017   Procedure: Atrial Fibrillation Ablation;  Surgeon: Constance Haw, MD;  Location: [place] [facility];  Service: Cardiovascular;  Laterality: N/A;  . BREAST LUMPECTOMY  2009   right, lymph node biopsy  . BTL  1971  . DILATION AND CURETTAGE OF UTERUS    . IR RADIOLOGY PERIPHERAL GUIDED IV START  06/23/2017  . IR US GUIDE VASC ACCESS LEFT  06/23/2017  . MASS EXCISION  03/28/2012   Procedure: EXCISION MASS;  Surgeon: Edward Jolly, MD;  Location: [facility];  Service: General;  Laterality: Right;  Excision right chest wall mass  . MASTECTOMY  2009   right breast  . POLYPECTOMY  1990   D&C (also in 1977 and [place])   .  PORT-A-CATH REMOVAL  10/03/2012   Procedure: REMOVAL PORT-A-CATH;  Surgeon: Edward Jolly, MD;  Location: [facility];  Service: General;  Laterality: N/A;  . PORTACATH PLACEMENT  04/16/2012   Procedure: INSERTION PORT-A-CATH;  Surgeon: Edward Jolly, MD;  Location: [facility];  Service: General;  Laterality: Left;  Placement of Port-a-Cath, left subclavian  . R vein ligation & stripping  1976  . Spleenectomy  1988   for Hodgkin's disease  . Thoractomy  1968  . TISSUE EXPANDER  REMOVAL W/ REPLACEMENT OF IMPLANT    . TISSUE EXPANDER PLACEMENT     right breast  . TONSILLECTOMY AND ADENOIDECTOMY  1965     Current Outpatient Medications  Medication Sig Dispense Refill  . acetaminophen (TYLENOL) 325 MG tablet Take 650 mg by mouth at bedtime as needed for moderate pain.    . Amino Acids-Protein Hydrolys (FEEDING SUPPLEMENT, PRO-STAT SUGAR FREE 64,) LIQD Take 30 mLs by mouth 2 (two) times daily. 900 mL 0  . atorvastatin (LIPITOR) 10 MG tablet Take 1 tablet (10 mg total) by mouth daily at 6 PM. 90 tablet 3  . b complex vitamins capsule Take 1 capsule by mouth daily after breakfast.    . calcium carbonate (OS-CAL) 600 MG TABS Take 600 mg by mouth at bedtime.     . carvedilol (COREG) 12.5 MG tablet TAKE 1 TABLET(12.5 MG) BY MOUTH TWICE DAILY WITH A MEAL 60 tablet 10  . Cholecalciferol (VITAMIN D) 2000 UNITS CAPS Take 2,000 Units by mouth daily with lunch.     . dofetilide (TIKOSYN) 250 MCG capsule TAKE 1 CAPSULE(250 MCG) BY MOUTH EVERY 12 HOURS 180 capsule 3  . doxycycline (VIBRAMYCIN) 100 MG capsule Take 1 capsule (100 mg total) by mouth 2 (two) times daily. 14 capsule 0  . levothyroxine (SYNTHROID, LEVOTHROID) 100 MCG tablet Take 100 mcg by mouth daily at 6 (six) AM.     . magnesium oxide (MAG-OX 400) 400 MG tablet Take 400 mg by mouth 2 (two) times daily.     . Misc Natural Products (LUTEIN 20) CAPS Take 20 mg by mouth daily with breakfast.    . Multiple Vitamin (MULTIVITAMIN) tablet Take 1  tablet by mouth daily after breakfast. "Natural Vitamin"    . OVER THE COUNTER MEDICATION BDZ tablets: Take 1 tablet by mouth at bedtime    . OVER THE COUNTER MEDICATION Vegetarian Omega Green: Take 3 capsules by mouth daily with lunch    . rivaroxaban (XARELTO) 20 MG TABS tablet TAKE 1 TABLET BY MOUTH DAILY WITH DINNER 90 tablet 3  . losartan (COZAAR) 50 MG tablet Take 1 tablet (50 mg total) by mouth 2 (two) times daily. 180 tablet 3   No current facility-administered medications for this visit.     Allergies:   Ace inhibitors; Benazepril; Epinephrine; Potassium-containing compounds; Prednisolone; and Taxotere [docetaxel]  Social History:  The patient  reports that she has never smoked. She has never used smokeless tobacco. She reports that she does not drink alcohol or use drugs.   Family History:  The patient's family history includes Hypertension in her mother.   ROS:  Please see the history of present illness.   Otherwise, review of systems is positive for appetite change, shortness of breath, palpitations.   All other systems are reviewed and negative.   PHYSICAL EXAM: VS:  BP 124/78   Pulse 78   Ht 5' 1"  (1.549 m)   Wt 134 lb 6.4 oz (61 kg)   LMP 03/08/1995   SpO2 92%   BMI 25.39 kg/m  , BMI Body mass index is 25.39 kg/m. GEN: Well nourished, well developed, in no acute distress  HEENT: normal  Neck: no JVD, carotid bruits, or masses Cardiac: RRR; no murmurs, rubs, or gallops,no edema  Respiratory:  clear to auscultation bilaterally, normal work of breathing GI: soft, nontender, nondistended, + BS MS: no deformity or atrophy  Skin: warm and dry Neuro:  Strength and sensation are intact Psych: euthymic mood, full affect  EKG:  EKG is not ordered today. Personal review of the ekg ordered 06/16/18 shows SR, rate 97, PVCs, IVCD   Recent Labs: 10/11/2017: Magnesium 2.3 06/16/2018: B Natriuretic Peptide 237.7 06/18/2018: ALT 20; BUN 20; Creatinine, Ser 0.74; Hemoglobin 12.1;  Platelets 248; Potassium 4.2; Sodium 139    Lipid Panel     Component Value Date/Time   CHOL 163 05/18/2013 0440   TRIG 175 (H) 05/18/2013 0440   HDL 26 (L) 05/18/2013 0440   CHOLHDL 6.3 05/18/2013 0440   VLDL 35 05/18/2013 0440   LDLCALC 102 (H) 05/18/2013 0440     Wt Readings from Last 3 Encounters:  06/19/18 134 lb 6.4 oz (61 kg)  06/16/18 135 lb 9.6 oz (61.5 kg)  01/18/18 139 lb (63 kg)      Other studies Reviewed: Additional studies/ records that were reviewed today include: TTE 05/03/18 Review of the above records today demonstrates:  - Left ventricle: The cavity size was normal. Wall thickness was   normal. Systolic function was normal. The estimated ejection   fraction was in the range of 55% to 60%. Wall motion was normal;   there were no regional wall motion abnormalities. Features are   consistent with a pseudonormal left ventricular filling pattern,   with concomitant abnormal relaxation and increased filling   pressure (grade 2 diastolic dysfunction). - Mitral valve: There was mild regurgitation. - Right atrium: The atrium was mildly dilated.  Holter 02/05/18 - personally reviewed Minimum HR: 69 BPM at 11:24:10 AM Maximum HR: 115 BPM at 5:25:32 PM(2) Average HR: 84 BPM 1% PVCs, <1% APCs Sinus rhythm, and sinus tachycardia seen. 4 beats SVT seen.   ASSESSMENT AND PLAN:  1.  Paroxysmal atrial fibrillation: Currently on dofetilide, Coreg, and Xarelto.  Had AF ablation 06/30/2017.  She is tolerating this well.  No further episodes of atrial fibrillation.  This patients CHA2DS2-VASc Score and unadjusted Ischemic Stroke Rate (% per year) is equal to 3.2 % stroke rate/year from a score of 3  Above score calculated as 1 point each if present [CHF, HTN, DM, Vascular=MI/PAD/Aortic Plaque, Age if 65-74, or Female] Above score calculated as 2 points each if present [Age > 75, or Stroke/TIA/TE]  2. Chronic diastolic heart failure: Blood pressure well controlled today.   No changes.  No signs of volume overload.  3. Hypertension: Well-controlled today.  No changes.  4.  PVCs: Sinus rhythm today.  I do not hear PVCs.  Appears to be coming from the outflow tracts.  No changes.   Current medicines are reviewed at length with the patient today.   The patient does not have concerns regarding her medicines.  The following changes were made today: None  Labs/ tests ordered today include:  Orders Placed This Encounter  Procedures  . Magnesium     Disposition:   FU with Sheppard Luckenbach 6 months  Signed, Lamont Tant Meredith Leeds, MD  06/19/2018 3:03 PM     [facility] [address] [place] [address] [phone] (office) [phone] (fax)

## 2018-06-19 NOTE — Patient Instructions (Signed)
Medication Instructions: Your physician recommends that you continue on your current medications as directed. Please refer to the Current Medication list given to you today.   Labwork: Your physician recommends that you have for lab work today: MAG   Procedures/Testing: None ordered  Follow-Up: Your physician recommends that you schedule a follow-up appointment in: 6 months with Dr. Curt Bears.   Any Additional Special Instructions Will Be Listed Below (If Applicable).     If you need a refill on your cardiac medications before your next appointment, please call your pharmacy.

## 2018-06-19 NOTE — Care Management Important Message (Signed)
Important Message  Patient Details  Name: [name] MRN: [id number] Date of Birth: 1946-08-03   Medicare Important Message Given:  Yes    Orbie Pyo 06/19/2018, 3:04 PM

## 2018-06-19 NOTE — Progress Notes (Signed)
Discharge instructions reviewed with patient. IV removed from left forearm with bandage placed. Patient is alert and oriented with VS stable. Discharged home with son providing transportation.

## 2018-06-20 LAB — MAGNESIUM: Magnesium: 2 mg/dL (ref 1.6–2.3)

## 2018-06-21 LAB — CULTURE, BLOOD (ROUTINE X 2)
CULTURE: NO GROWTH
Culture: NO GROWTH
Special Requests: ADEQUATE

## 2018-06-25 ENCOUNTER — Other Ambulatory Visit: Payer: Self-pay

## 2018-06-25 NOTE — Patient Outreach (Signed)
[facility] [facility]) Care Management  06/25/2018  [name] 01/25/1946 [id number]   EMMI: pneumonia RED alert Referral date: 06/25/18 Referral reason: been to follow up appointment: no Insurance: Medicare Day # 3  Telephone call to patient regarding EMMI pneumonia red alert. Patient states she is unable to talk at this time and request call back.Marland Kitchen    PLAN: RNCM will attempt 2nd telephone call within 4 business days.  RNCM will send outreach letter.   Quinn Plowman RN,BSN,CCM [facility] Telephonic  [phone]

## 2018-06-28 ENCOUNTER — Other Ambulatory Visit: Payer: Self-pay

## 2018-06-28 NOTE — Patient Outreach (Signed)
[place] [facility]) Care Management  06/28/2018  [name] [birth year] [id number]   EMMI: pneumonia red alert Referral date: 06/28/18 Referral reason: been to follow-up appointment: no Insurance: Medicare Day # 3  Telephone call to patient regarding EMMI pneumonia red alert. HIPAA verified with patient.  Explained reason for call. Patient states her primary MD was out of the office this week. She states she is scheduled to see her primary MD on 07/04/18.  Patient states she saw her cardiologist on the day she was discharged from the hospital 06/19/18.  Patient states she completed her antibiotic on yesterday. Patient states she feels pretty good. She states she is about half strength.  Reports she is walking her dog in the morning and is able to do a few more activities.   Patient reports she has had loose stools since prior to going into the hospital. Patient states he appetite wasn't very good before going into the hospital. She states she is seeing some improvement now with her appetite. Patient states she continues to have the loose stools more than 2 per day. Patient states within the last day she has noticed some improvement with frequency but the stool is still loose. Patient denies any other symptoms. RNCM advised patient to contact her doctors office to inform them of the loose stools. Patient states she is taking probiotics. She states she wants to see how she does since she has discontinued the antibiotic and will report the loose stools at her appointment on 07/04/18.  RNCM advised patient if stools worsen or contain blood to call her doctor immediately. Patient verbalized understanding.  Patient denies any further needs at this time.  Patient informed that she would continue to receive automated phone calls with the [facility] program. Patient verbally agreed to ongoing automated EMMI calls. RNCM provided patient contact phone number for the 24 hour nurse advise line.   PLAN; RNCM  will close patient due to patient being assessed and having no further needs.  RNCM will send patient [facility] care management brochure/ magnet  Quinn Plowman RN,BSN,CCM [facility] Telephonic  [phone]  .

## 2018-06-29 ENCOUNTER — Other Ambulatory Visit: Payer: Self-pay

## 2018-06-29 NOTE — Patient Outreach (Signed)
[facility]) Care Management  06/29/2018  [name] 02/26/1946 [id number]  EMMI: pneumonia red alert Referral date: 06/29/18 Referral reason: back to pre-sick activity level Insurance: Medicare Day #9 Attempt #1  Telephone call to patient regarding EMMI pneumonia red alert. Patient states she is not able to talk because she is going to an appointment. Request return call.   PLAN: RNCM will attempt 2nd telephone call to patient within 4 business days.   RNCM will not send outreach letter due to outreach letter being recently sent on 06/25/18  Quinn Plowman RN,BSN,CCM [facility] Telephonic  [phone]

## 2018-06-30 ENCOUNTER — Other Ambulatory Visit: Payer: Self-pay | Admitting: Cardiology

## 2018-06-30 DIAGNOSIS — I10 Essential (primary) hypertension: Secondary | ICD-10-CM

## 2018-06-30 DIAGNOSIS — I4819 Other persistent atrial fibrillation: Secondary | ICD-10-CM

## 2018-06-30 DIAGNOSIS — I5032 Chronic diastolic (congestive) heart failure: Secondary | ICD-10-CM

## 2018-07-04 ENCOUNTER — Other Ambulatory Visit: Payer: Self-pay

## 2018-07-04 NOTE — Patient Outreach (Signed)
[facility]) Care Management  07/04/2018  ESTEPHANIE HUBBS 27-Feb-1946 [id number]   EMMI: pneumonia red alert Referral date: 06/29/18 Referral reason: back to pre-sick activity level Insurance: Medicare Day #9   Telephone call to patient regarding EMMI pneumonia red alert. HIPAA verified with patient. Explained reason for call. Patient states she is not having any new concerns or problems. Patient states she still only has 1/2 of her endurance.  Patient states she understands it will take her a while to get her strength back.   Patient states she is no longer having diarrhea. Patient states she continues to take the probiotic as directed by her doctor/.  Patient denies any further needs. Patient states she has a follow up appointment with her doctor on tomorrow.  RNCM advised patient to notify MD of any changes in condition. RNCM verified patient aware of 911 services for urgent/ emergent needs.  PLAN:  RNCM will close patient due to patient being assessed and having no further needs.  RNCM will send closure notification to patients primary MD.   Quinn Plowman RN,BSN,CCM [facility] Telephonic  [phone]

## 2018-07-05 DIAGNOSIS — R195 Other fecal abnormalities: Secondary | ICD-10-CM | POA: Diagnosis not present

## 2018-07-05 DIAGNOSIS — J189 Pneumonia, unspecified organism: Secondary | ICD-10-CM | POA: Diagnosis not present

## 2018-07-06 DIAGNOSIS — R195 Other fecal abnormalities: Secondary | ICD-10-CM | POA: Diagnosis not present

## 2018-07-23 DIAGNOSIS — Z01419 Encounter for gynecological examination (general) (routine) without abnormal findings: Secondary | ICD-10-CM | POA: Diagnosis not present

## 2018-07-23 DIAGNOSIS — C50919 Malignant neoplasm of unspecified site of unspecified female breast: Secondary | ICD-10-CM | POA: Insufficient documentation

## 2018-07-23 DIAGNOSIS — Z1231 Encounter for screening mammogram for malignant neoplasm of breast: Secondary | ICD-10-CM | POA: Diagnosis not present

## 2018-07-23 DIAGNOSIS — Z853 Personal history of malignant neoplasm of breast: Secondary | ICD-10-CM | POA: Diagnosis not present

## 2018-07-25 DIAGNOSIS — R197 Diarrhea, unspecified: Secondary | ICD-10-CM | POA: Diagnosis not present

## 2018-07-26 DIAGNOSIS — R197 Diarrhea, unspecified: Secondary | ICD-10-CM | POA: Diagnosis not present

## 2018-07-27 ENCOUNTER — Other Ambulatory Visit: Payer: Self-pay | Admitting: Cardiovascular Disease

## 2018-07-27 NOTE — Telephone Encounter (Signed)
Pt is a 72 yr old female who saw Dr Curt Bears on 06/19/18. Weight at that visit was 61 Kg. Last SCr on 06/18/18 was 0.74. CrCl is 66.68mL/min. Will refill Xarelto 20mg  QD.

## 2018-08-02 ENCOUNTER — Other Ambulatory Visit: Payer: Self-pay | Admitting: Cardiovascular Disease

## 2018-08-02 ENCOUNTER — Other Ambulatory Visit: Payer: Self-pay

## 2018-08-02 ENCOUNTER — Emergency Department (HOSPITAL_COMMUNITY): Payer: Medicare Other

## 2018-08-02 ENCOUNTER — Observation Stay (HOSPITAL_COMMUNITY)
Admission: EM | Admit: 2018-08-02 | Discharge: 2018-08-03 | Disposition: A | Discharge status: Home / Self Care | Payer: Medicare Other | Attending: Internal Medicine | Admitting: Internal Medicine

## 2018-08-02 ENCOUNTER — Encounter (HOSPITAL_COMMUNITY): Payer: Self-pay | Admitting: Emergency Medicine

## 2018-08-02 DIAGNOSIS — J9601 Acute respiratory failure with hypoxia: Secondary | ICD-10-CM | POA: Diagnosis not present

## 2018-08-02 DIAGNOSIS — I700 Atherosclerosis of aorta: Secondary | ICD-10-CM | POA: Diagnosis not present

## 2018-08-02 DIAGNOSIS — J189 Pneumonia, unspecified organism: Secondary | ICD-10-CM

## 2018-08-02 DIAGNOSIS — Z79890 Hormone replacement therapy (postmenopausal): Secondary | ICD-10-CM | POA: Diagnosis not present

## 2018-08-02 DIAGNOSIS — Z9889 Other specified postprocedural states: Secondary | ICD-10-CM | POA: Insufficient documentation

## 2018-08-02 DIAGNOSIS — E039 Hypothyroidism, unspecified: Secondary | ICD-10-CM | POA: Insufficient documentation

## 2018-08-02 DIAGNOSIS — E875 Hyperkalemia: Secondary | ICD-10-CM | POA: Insufficient documentation

## 2018-08-02 DIAGNOSIS — I480 Paroxysmal atrial fibrillation: Secondary | ICD-10-CM | POA: Insufficient documentation

## 2018-08-02 DIAGNOSIS — I313 Pericardial effusion (noninflammatory): Secondary | ICD-10-CM | POA: Insufficient documentation

## 2018-08-02 DIAGNOSIS — Z8571 Personal history of Hodgkin lymphoma: Secondary | ICD-10-CM | POA: Diagnosis not present

## 2018-08-02 DIAGNOSIS — Z888 Allergy status to other drugs, medicaments and biological substances status: Secondary | ICD-10-CM | POA: Diagnosis not present

## 2018-08-02 DIAGNOSIS — Z853 Personal history of malignant neoplasm of breast: Secondary | ICD-10-CM | POA: Diagnosis not present

## 2018-08-02 DIAGNOSIS — Z923 Personal history of irradiation: Secondary | ICD-10-CM | POA: Diagnosis not present

## 2018-08-02 DIAGNOSIS — I7300 Raynaud's syndrome without gangrene: Secondary | ICD-10-CM | POA: Insufficient documentation

## 2018-08-02 DIAGNOSIS — Z8249 Family history of ischemic heart disease and other diseases of the circulatory system: Secondary | ICD-10-CM | POA: Diagnosis not present

## 2018-08-02 DIAGNOSIS — C7989 Secondary malignant neoplasm of other specified sites: Secondary | ICD-10-CM

## 2018-08-02 DIAGNOSIS — J90 Pleural effusion, not elsewhere classified: Secondary | ICD-10-CM | POA: Diagnosis not present

## 2018-08-02 DIAGNOSIS — I447 Left bundle-branch block, unspecified: Secondary | ICD-10-CM | POA: Diagnosis not present

## 2018-08-02 DIAGNOSIS — I110 Hypertensive heart disease with heart failure: Secondary | ICD-10-CM | POA: Insufficient documentation

## 2018-08-02 DIAGNOSIS — E785 Hyperlipidemia, unspecified: Secondary | ICD-10-CM | POA: Insufficient documentation

## 2018-08-02 DIAGNOSIS — I5032 Chronic diastolic (congestive) heart failure: Secondary | ICD-10-CM | POA: Insufficient documentation

## 2018-08-02 DIAGNOSIS — Z79899 Other long term (current) drug therapy: Secondary | ICD-10-CM | POA: Diagnosis not present

## 2018-08-02 DIAGNOSIS — I6523 Occlusion and stenosis of bilateral carotid arteries: Secondary | ICD-10-CM | POA: Diagnosis not present

## 2018-08-02 DIAGNOSIS — I429 Cardiomyopathy, unspecified: Secondary | ICD-10-CM | POA: Diagnosis not present

## 2018-08-02 DIAGNOSIS — Z9882 Breast implant status: Secondary | ICD-10-CM | POA: Insufficient documentation

## 2018-08-02 DIAGNOSIS — Z8674 Personal history of sudden cardiac arrest: Secondary | ICD-10-CM | POA: Insufficient documentation

## 2018-08-02 DIAGNOSIS — Z9911 Dependence on respirator [ventilator] status: Secondary | ICD-10-CM | POA: Insufficient documentation

## 2018-08-02 DIAGNOSIS — C50919 Malignant neoplasm of unspecified site of unspecified female breast: Secondary | ICD-10-CM | POA: Diagnosis present

## 2018-08-02 DIAGNOSIS — I10 Essential (primary) hypertension: Secondary | ICD-10-CM | POA: Diagnosis present

## 2018-08-02 DIAGNOSIS — R0902 Hypoxemia: Secondary | ICD-10-CM

## 2018-08-02 DIAGNOSIS — I428 Other cardiomyopathies: Secondary | ICD-10-CM

## 2018-08-02 DIAGNOSIS — R0602 Shortness of breath: Secondary | ICD-10-CM | POA: Diagnosis not present

## 2018-08-02 DIAGNOSIS — Z91041 Radiographic dye allergy status: Secondary | ICD-10-CM | POA: Diagnosis not present

## 2018-08-02 DIAGNOSIS — Z7901 Long term (current) use of anticoagulants: Secondary | ICD-10-CM | POA: Insufficient documentation

## 2018-08-02 DIAGNOSIS — R001 Bradycardia, unspecified: Secondary | ICD-10-CM | POA: Diagnosis not present

## 2018-08-02 HISTORY — DX: Pneumonia, unspecified organism: J18.9

## 2018-08-02 LAB — CBC WITH DIFFERENTIAL/PLATELET
ABS IMMATURE GRANULOCYTES: 0 10*3/uL (ref 0.0–0.1)
Basophils Absolute: 0.2 10*3/uL — ABNORMAL HIGH (ref 0.0–0.1)
Basophils Relative: 2 %
EOS PCT: 2 %
Eosinophils Absolute: 0.2 10*3/uL (ref 0.0–0.7)
HEMATOCRIT: 43.9 % (ref 36.0–46.0)
HEMOGLOBIN: 13.9 g/dL (ref 12.0–15.0)
IMMATURE GRANULOCYTES: 0 %
LYMPHS ABS: 1.9 10*3/uL (ref 0.7–4.0)
LYMPHS PCT: 17 %
MCH: 29.7 pg (ref 26.0–34.0)
MCHC: 31.7 g/dL (ref 30.0–36.0)
MCV: 93.8 fL (ref 78.0–100.0)
Monocytes Absolute: 0.8 10*3/uL (ref 0.1–1.0)
Monocytes Relative: 7 %
Neutro Abs: 8 10*3/uL — ABNORMAL HIGH (ref 1.7–7.7)
Neutrophils Relative %: 72 %
Platelets: 391 10*3/uL (ref 150–400)
RBC: 4.68 MIL/uL (ref 3.87–5.11)
RDW: 14.8 % (ref 11.5–15.5)
WBC: 11.1 10*3/uL — AB (ref 4.0–10.5)

## 2018-08-02 LAB — COMPREHENSIVE METABOLIC PANEL
ALBUMIN: 3.9 g/dL (ref 3.5–5.0)
ALK PHOS: 108 U/L (ref 38–126)
ALT: 25 U/L (ref 0–44)
AST: 44 U/L — AB (ref 15–41)
Anion gap: 9 (ref 5–15)
BILIRUBIN TOTAL: 1.6 mg/dL — AB (ref 0.3–1.2)
BUN: 24 mg/dL — AB (ref 8–23)
CALCIUM: 9.3 mg/dL (ref 8.9–10.3)
CO2: 26 mmol/L (ref 22–32)
Chloride: 104 mmol/L (ref 98–111)
Creatinine, Ser: 0.85 mg/dL (ref 0.44–1.00)
GFR calc Af Amer: >60 mL/min (ref >60)
GFR calc non Af Amer: >60 mL/min (ref >60)
GLUCOSE: 111 mg/dL — AB (ref 70–99)
Potassium: 5.7 mmol/L — ABNORMAL HIGH (ref 3.5–5.1)
Sodium: 139 mmol/L (ref 135–145)
TOTAL PROTEIN: 7 g/dL (ref 6.5–8.1)

## 2018-08-02 LAB — PROCALCITONIN: Procalcitonin: <0.1 ng/mL

## 2018-08-02 LAB — BRAIN NATRIURETIC PEPTIDE: B Natriuretic Peptide: 443.6 pg/mL — ABNORMAL HIGH (ref 0.0–100.0)

## 2018-08-02 LAB — I-STAT TROPONIN, ED: Troponin i, poc: 0.01 ng/mL (ref 0.00–0.08)

## 2018-08-02 LAB — I-STAT CG4 LACTIC ACID, ED
Lactic Acid, Venous: 1.25 mmol/L (ref 0.5–1.9)
Lactic Acid, Venous: 0.96 mmol/L (ref 0.5–1.9)

## 2018-08-02 LAB — STREP PNEUMONIAE URINARY ANTIGEN: STREP PNEUMO URINARY ANTIGEN: NEGATIVE

## 2018-08-02 LAB — MAGNESIUM: Magnesium: 2 mg/dL (ref 1.7–2.4)

## 2018-08-02 LAB — POTASSIUM: Potassium: 4.7 mmol/L (ref 3.5–5.1)

## 2018-08-02 MED ORDER — VANCOMYCIN HCL 500 MG IV SOLR
500.0000 mg | Freq: Two times a day (BID) | INTRAVENOUS | Status: DC
  Filled 2018-08-02: qty 500

## 2018-08-02 MED ORDER — LOSARTAN POTASSIUM 50 MG PO TABS: 50.0000 mg | ORAL_TABLET | Freq: Two times a day (BID) | ORAL | Status: DC

## 2018-08-02 MED ORDER — LEVOTHYROXINE SODIUM 100 MCG PO TABS
100.0000 ug | ORAL_TABLET | Freq: Every day | ORAL | Status: DC
  Administered 2018-08-03: 100 ug via ORAL
  Filled 2018-08-02: qty 1

## 2018-08-02 MED ORDER — ATORVASTATIN CALCIUM 20 MG PO TABS
10.0000 mg | ORAL_TABLET | Freq: Every day | ORAL | Status: DC
  Administered 2018-08-02: 10 mg via ORAL
  Filled 2018-08-02: qty 1

## 2018-08-02 MED ORDER — IPRATROPIUM-ALBUTEROL 0.5-2.5 (3) MG/3ML IN SOLN
3.0000 mL | Freq: Once | RESPIRATORY_TRACT | Status: AC
  Administered 2018-08-02: 3 mL via RESPIRATORY_TRACT
  Filled 2018-08-02: qty 3

## 2018-08-02 MED ORDER — SODIUM CHLORIDE 0.9 % IV SOLN
1.0000 g | Freq: Every day | INTRAVENOUS | Status: DC
  Administered 2018-08-02 – 2018-08-03 (×2): 1 g via INTRAVENOUS
  Filled 2018-08-02 (×2): qty 10

## 2018-08-02 MED ORDER — VANCOMYCIN HCL IN DEXTROSE 1-5 GM/200ML-% IV SOLN
1000.0000 mg | Freq: Once | INTRAVENOUS | Status: AC
  Administered 2018-08-02: 1000 mg via INTRAVENOUS
  Filled 2018-08-02: qty 200

## 2018-08-02 MED ORDER — CEFEPIME HCL 1 G IJ SOLR
1.0000 g | Freq: Two times a day (BID) | INTRAMUSCULAR | Status: DC
  Filled 2018-08-02: qty 1

## 2018-08-02 MED ORDER — GUAIFENESIN ER 600 MG PO TB12
600.0000 mg | ORAL_TABLET | Freq: Two times a day (BID) | ORAL | Status: DC | PRN
  Filled 2018-08-02: qty 1

## 2018-08-02 MED ORDER — IPRATROPIUM-ALBUTEROL 0.5-2.5 (3) MG/3ML IN SOLN: 3.0000 mL | Freq: Four times a day (QID) | RESPIRATORY_TRACT | Status: DC | PRN

## 2018-08-02 MED ORDER — DOFETILIDE 250 MCG PO CAPS: 250.0000 ug | ORAL_CAPSULE | Freq: Two times a day (BID) | ORAL | Status: DC

## 2018-08-02 MED ORDER — SODIUM CHLORIDE 0.9 % IV SOLN
500.0000 mg | Freq: Every day | INTRAVENOUS | Status: DC
  Administered 2018-08-02 – 2018-08-03 (×2): 500 mg via INTRAVENOUS
  Filled 2018-08-02 (×2): qty 500

## 2018-08-02 MED ORDER — RIVAROXABAN 20 MG PO TABS
20.0000 mg | ORAL_TABLET | Freq: Every day | ORAL | Status: DC
  Administered 2018-08-02: 20 mg via ORAL
  Filled 2018-08-02: qty 1

## 2018-08-02 MED ORDER — SODIUM CHLORIDE 0.9 % IV SOLN
2.0000 g | Freq: Once | INTRAVENOUS | Status: AC
  Administered 2018-08-02: 2 g via INTRAVENOUS
  Filled 2018-08-02: qty 2

## 2018-08-02 MED ORDER — MAGNESIUM OXIDE 400 (241.3 MG) MG PO TABS
400.0000 mg | ORAL_TABLET | Freq: Two times a day (BID) | ORAL | Status: DC
  Administered 2018-08-02 – 2018-08-03 (×3): 400 mg via ORAL
  Filled 2018-08-02 (×3): qty 1

## 2018-08-02 MED ORDER — CARVEDILOL 12.5 MG PO TABS
12.5000 mg | ORAL_TABLET | Freq: Two times a day (BID) | ORAL | Status: DC
  Administered 2018-08-02 – 2018-08-03 (×3): 12.5 mg via ORAL
  Filled 2018-08-02 (×3): qty 1

## 2018-08-02 MED ORDER — FUROSEMIDE 10 MG/ML IJ SOLN
40.0000 mg | Freq: Once | INTRAMUSCULAR | Status: AC
  Administered 2018-08-02: 40 mg via INTRAVENOUS
  Filled 2018-08-02: qty 4

## 2018-08-02 NOTE — ED Provider Notes (Signed)
Patient seen/examined in the Emergency Department in conjunction with Midlevel Provider  Patient reports cough and shortness of breath, denies active chest pain Exam : Awake alert, no acute distress.  Crackles noted, as well as bilateral wheezing Plan: Patient with new oxygen requirement with abnormal lung sounds.  Chest x-ray appears to be worsening from recent hospital admission.  Since this is likely a recurrent pneumonia, I feel she would benefit from CT chest and then admission   Ripley Fraise, MD 08/02/18 [phone]

## 2018-08-02 NOTE — H&P (Addendum)
History and Physical    [name] DOB: July 07, 1946 DOA: 08/02/2018   PCP: [name], MD   Patient coming from:  Home    Chief Complaint:  HPI: [name] is a 72 y.o. female with medical history significant for ICM, diastolic heart failure, EF 45%, hypertension, left bundle branch block, hypothyroidism, history of Hodgkin's disease in 1998  history of breast cancer 2009, with recurrent breast cancer in 2013, status post mastectomy and radiation, not on therapy, paroxysmal atrial fibrillation, carotid stenosis, presenting to the ED complaining of gradual but rapid persisting, shortness of breath since 9:30 PM last night.  Of note, the patient had recently discharged from the hospital with pneumonia on July 22, and since then, the patient has experienced significant fatigue, but no dyspnea on exertion.  The patient reports feeling "as if it is taking forever to get better".  She denies any fever, chills, or diaphoresis, chest pain or palpitations.  She denies any sore throat pain, she denies any nausea, vomiting.  She denies any syncope or presyncope.  The patient is not on oxygen at home.  ED Course:  BP (!) 171/65 (BP Location: Left Leg)   Pulse (!) 47   Temp (!) 97.4 F (36.3 C) (Oral)   Resp 18   Ht 5' 1"  (1.549 m)   Wt 60.8 kg   LMP 03/08/1995   SpO2 98%   BMI 25.32 kg/m   The ED, she was placed on oxygen 2 L, and was given 1 breathing treatment with significant improvement of her symptoms. White Count 11.1. Potassium 5.7 Total bilirubin 1.6, BUN 24, AST 44 EKG sinus rhythm, ventricular bigeminy, QTC is 540.  Cardiology evaluation pending. Chest x-ray patchy bibasilar infiltrates, with associated small effusions CT of the chest confirms bibasilar pneumonia, no PE He was placed on Maxipime, vancomycin IV, and also was given She also got 1 dose of Lasix 40 mg at the ER.  Review of Systems:  As per HPI otherwise all other systems reviewed and are  negative  Past Medical History:  Diagnosis Date  . Anemia   . Arthritis    fingers   . Blood transfusion    1968  . Breast cancer ([place]) 2009   T1N0M0 DCIS right breast  . Breast cancer ([place]) 2013   a. reocurrence right breast;  b. s/p Taxotere, Cytoxan and XRT  . Carotid stenosis    a. dopplers 6/75:  RICA 9-16%, LICA 38-46% => repeat due in 01/2013;  b.  Carotid US (6/59): RICA 9-35%; LICA 70-17% - f/u 6 mos  . Fracture of left patella 12/2016  . History of radiation therapy 07/10/12-08/13/12   right breast/chest wall  . Hodgkin's disease    stage 2b;  s/p XRT 1988  . HTN (hypertension)   . Hypothyroidism    2/2 XRT for Hodgkins Lymphoma  . LBBB (left bundle branch block)   . Mucositis 04/29/2012  . NICM (nonischemic cardiomyopathy) ([place])    a. EF as low as 45%;  b. LHC 5/05:  LM 10%, pLAD 30%, EF 45-50%,   c. Echo 5/13:  EF 60-65%, Gr 1 diast dysfn, MAC;   d.  Echo 11/13:  EF 60-65%, Gr 1 diast dysfn, Tr AI, mild MR;  d.  Echo 05/10/13: EF 79-39%, grade 1 diastolic dysfunction, PASP 36, small effusion.  Marland Kitchen PAF (paroxysmal atrial fibrillation) ([place])    a. dx in 2008;  b. CHADS2-VASc=5;  c. changed to [place] during admx 05/2013; d.  Xarelto started 05/2013; e. s/p successful Afib ablation 06/30/2017  . PEA (Pulseless electrical activity) ([place])    a. admx 05/2013 for APE in setting of HTN emergency c/b PEA arrest and asp pneumonia, VDRF, AFib with RVR  . Raynaud's disease   . Status post radiation therapy 1988   mantle and periaortic     Past Surgical History:  Procedure Laterality Date  . ATRIAL FIBRILLATION ABLATION  06/30/2017  . ATRIAL FIBRILLATION ABLATION N/A 06/30/2017   Procedure: Atrial Fibrillation Ablation;  Surgeon: Constance Haw, MD;  Location: [facility];  Service: Cardiovascular;  Laterality: N/A;  . BREAST LUMPECTOMY  2009   right, lymph node biopsy  . BTL  1971  . DILATION AND CURETTAGE OF UTERUS    . IR RADIOLOGY PERIPHERAL GUIDED IV START  06/23/2017    . IR US GUIDE VASC ACCESS LEFT  06/23/2017  . MASS EXCISION  03/28/2012   Procedure: EXCISION MASS;  Surgeon: Edward Jolly, MD;  Location: [facility];  Service: General;  Laterality: Right;  Excision right chest wall mass  . MASTECTOMY  2009   right breast  . POLYPECTOMY  1990   D&C (also in 1977 and [place])   . PORT-A-CATH REMOVAL  10/03/2012   Procedure: REMOVAL PORT-A-CATH;  Surgeon: Edward Jolly, MD;  Location: [facility];  Service: General;  Laterality: N/A;  . PORTACATH PLACEMENT  04/16/2012   Procedure: INSERTION PORT-A-CATH;  Surgeon: Edward Jolly, MD;  Location: [facility];  Service: General;  Laterality: Left;  Placement of Port-a-Cath, left subclavian  . R vein ligation & stripping  1976  . Spleenectomy  1988   for Hodgkin's disease  . Thoractomy  1968  . TISSUE EXPANDER  REMOVAL W/ REPLACEMENT OF IMPLANT    . TISSUE EXPANDER PLACEMENT     right breast  . TONSILLECTOMY AND ADENOIDECTOMY  1965    Social History Social History   Socioeconomic History  . Marital status: Widowed    Spouse name: Donnie  . Number of children: Not on file  . Years of education: Not on file  . Highest education level: Not on file  Occupational History  . Occupation: Holiday representative: [employer]  Social Needs  . Financial resource strain: Not on file  . Food insecurity:    Worry: Not on file    Inability: Not on file  . Transportation needs:    Medical: Not on file    Non-medical: Not on file  Tobacco Use  . Smoking status: Never Smoker  . Smokeless tobacco: Never Used  Substance and Sexual Activity  . Alcohol use: No  . Drug use: No  . Sexual activity: Not Currently    Birth control/protection: None    Comment: post menopausal, HRT x 8 yrs  Lifestyle  . Physical activity:    Days per week: Not on file    Minutes per session: Not on file  . Stress: Not on file  Relationships  . Social connections:    Talks on phone: Not on file    Gets  together: Not on file    Attends religious service: Not on file    Active member of club or organization: Not on file    Attends meetings of clubs or organizations: Not on file    Relationship status: Not on file  . Intimate partner violence:    Fear of current or ex partner: Not on file  Emotionally abused: Not on file    Physically abused: Not on file    Forced sexual activity: Not on file  Other Topics Concern  . Not on file  Social History Narrative   Regular exercise. Retired and married.      Allergies  Allergen Reactions  . Ace Inhibitors Cough  . Benazepril Cough  . Epinephrine Other (See Comments)    Feels "buzzed" and heart rate increases  . Iodinated Diagnostic Agents Other (See Comments)    Sneezing   . Potassium-Containing Compounds Nausea And Vomiting    Projectile vomiting--liquid potassium   . Prednisolone Other (See Comments)    Arrhythmia(s)  . Taxotere [Docetaxel] Other (See Comments)    Fluid overload    Family History  Problem Relation Age of Onset  . Hypertension Mother   . Cancer Neg Hx        Prior to Admission medications   Medication Sig Start Date End Date Taking? Authorizing Provider  acetaminophen (TYLENOL) 325 MG tablet Take 650 mg by mouth at bedtime as needed for moderate pain.   Yes [provider]  atorvastatin (LIPITOR) 10 MG tablet Take 1 tablet (10 mg total) by mouth daily at 6 PM. 01/04/18  Yes Josue Hector, MD  b complex vitamins capsule Take 1 capsule by mouth daily after breakfast.   Yes [provider]  calcium carbonate (OS-CAL) 600 MG TABS Take 600 mg by mouth at bedtime.    Yes [provider]  carvedilol (COREG) 12.5 MG tablet TAKE 1 TABLET(12.5 MG) BY MOUTH TWICE DAILY WITH A MEAL Patient taking differently: Take 12.5 mg by mouth 2 (two) times daily with a meal.  07/03/17  Yes Josue Hector, MD  Cholecalciferol (VITAMIN D) 2000 UNITS CAPS Take 2,000 Units by mouth daily with lunch.    Yes  [provider]  dofetilide (TIKOSYN) 250 MCG capsule TAKE 1 CAPSULE(250 MCG) BY MOUTH EVERY 12 HOURS Patient taking differently: Take 250 mcg by mouth 2 (two) times daily.  09/22/17  Yes Josue Hector, MD  levothyroxine (SYNTHROID, LEVOTHROID) 100 MCG tablet Take 100 mcg by mouth daily at 6 (six) AM.    Yes [provider]  losartan (COZAAR) 50 MG tablet TAKE 1 TABLET BY MOUTH TWICE DAILY 07/02/18  Yes Camnitz, Ocie Doyne, MD  magnesium oxide (MAG-OX 400) 400 MG tablet Take 400 mg by mouth 2 (two) times daily.    Yes [provider]  Misc Natural Products (LUTEIN 20) CAPS Take 20 mg by mouth daily with breakfast.   Yes [provider]  Multiple Vitamin (MULTIVITAMIN) tablet Take 1 tablet by mouth daily after breakfast. "Natural Vitamin"   Yes [provider]  OVER THE COUNTER MEDICATION BDZ tablets: Take 1 tablet by mouth at bedtime   Yes [provider]  OVER THE COUNTER MEDICATION Vegetarian Omega Green: Take 3 capsules by mouth daily with lunch   Yes [provider]  XARELTO 20 MG TABS tablet TAKE 1 TABLET BY MOUTH DAILY WITH DINNER Patient taking differently: Take 20 mg by mouth daily with supper.  07/27/18  Yes Camnitz, Will Hassell Done, MD  Amino Acids-Protein Hydrolys (FEEDING SUPPLEMENT, PRO-STAT SUGAR FREE 64,) LIQD Take 30 mLs by mouth 2 (two) times daily. Patient not taking: Reported on 08/02/2018 06/19/18   Jani Gravel, MD     Physical Exam:  Vitals:   08/02/18 0515 08/02/18 0630 08/02/18 0700 08/02/18 0803  BP: (!) 149/66 (!) 160/63 124/87 (!) 171/65  Pulse: Marland Kitchen)  42 (!) 41 82 (!) 47  Resp: 19 16 18    Temp:    (!) 97.4 F (36.3 C)  TempSrc:    Oral  SpO2: 98% 99% 98% 98%  Weight:      Height:       Constitutional: NAD, calm, comfortable, fatigued appearing Eyes: PERRL, lids and conjunctivae normal ENMT: Mucous membranes are moist, without exudate or lesions  Neck: normal, supple, no masses, no thyromegaly Respiratory:  Minimal wheezing, bibasilar crackles, diffuse rhonchi at the bases Normal respiratory effort but experiences cough when taking a deep breath Cardiovascular: Irregular rate rate and rhythm,  murmur, rubs or gallops. No extremity edema. 2+ pedal pulses. No carotid bruits.  Abdomen: Soft, non tender, No hepatosplenomegaly. Bowel sounds positive.  Musculoskeletal: no clubbing / cyanosis. Moves all extremities Skin: no jaundice, No lesions.  Neurologic: Sensation intact  Strength equal in all extremities Psychiatric:   Alert and oriented x 3. Normal mood.       Labs on Admission: I have personally reviewed following labs and imaging studies  CBC: Recent Labs  Lab 08/02/18 0141  WBC 11.1*  NEUTROABS 8.0*  HGB 13.9  HCT 43.9  MCV 93.8  PLT 557    Basic Metabolic Panel: Recent Labs  Lab 08/02/18 0141  NA 139  K 5.7*  CL 104  CO2 26  GLUCOSE 111*  BUN 24*  CREATININE 0.85  CALCIUM 9.3    GFR: Estimated Creatinine Clearance: 50.1 mL/min (by C-G formula based on SCr of 0.85 mg/dL).  Liver Function Tests: Recent Labs  Lab 08/02/18 0141  AST 44*  ALT 25  ALKPHOS 108  BILITOT 1.6*  PROT 7.0  ALBUMIN 3.9   No results for input(s): LIPASE, AMYLASE in the last 168 hours. No results for input(s): AMMONIA in the last 168 hours.  Coagulation Profile: No results for input(s): INR, PROTIME in the last 168 hours.  Cardiac Enzymes: No results for input(s): CKTOTAL, CKMB, CKMBINDEX, TROPONINI in the last 168 hours.  BNP (last 3 results) No results for input(s): PROBNP in the last 8760 hours.  HbA1C: No results for input(s): HGBA1C in the last 72 hours.  CBG: No results for input(s): GLUCAP in the last 168 hours.  Lipid Profile: No results for input(s): CHOL, HDL, LDLCALC, TRIG, CHOLHDL, LDLDIRECT in the last 72 hours.  Thyroid Function Tests: No results for input(s): TSH, T4TOTAL, FREET4, T3FREE, THYROIDAB in the last 72 hours.  Anemia Panel: No results for  input(s): VITAMINB12, FOLATE, FERRITIN, TIBC, IRON, RETICCTPCT in the last 72 hours.  Urine analysis:    Component Value Date/Time   COLORURINE YELLOW 06/16/2018 0845   APPEARANCEUR CLEAR 06/16/2018 0845   LABSPEC 1.023 06/16/2018 0845   PHURINE 5.0 06/16/2018 0845   GLUCOSEU NEGATIVE 06/16/2018 0845   HGBUR MODERATE (A) 06/16/2018 0845   BILIRUBINUR NEGATIVE 06/16/2018 0845   KETONESUR 20 (A) 06/16/2018 0845   PROTEINUR NEGATIVE 06/16/2018 0845   UROBILINOGEN 0.2 05/10/2013 0438   NITRITE NEGATIVE 06/16/2018 0845   LEUKOCYTESUR NEGATIVE 06/16/2018 0845    Sepsis Labs: @LABRCNTIP (procalcitonin:4,lacticidven:4) )No results found for this or any previous visit (from the past 240 hour(s)).   Radiological Exams on Admission: Dg Chest 2 View  Result Date: 08/02/2018 CLINICAL DATA:  Shortness of breath for several hours EXAM: CHEST - 2 VIEW COMPARISON:  06/16/2018 FINDINGS: Cardiac shadow is stable. Patchy bibasilar infiltrates and small effusions are again identified and stable from the prior exam. No new focal abnormality is seen. No bony abnormality is  noted. IMPRESSION: Patchy bibasilar infiltrates with associated small effusions stable from previous exam. Electronically Signed   By: Inez Catalina M.D.   On: 08/02/2018 02:06   Ct Chest Wo Contrast  Result Date: 08/02/2018 CLINICAL DATA:  Shortness of breath and cough starting tonight. History of pneumonia in July 2019. EXAM: CT CHEST WITHOUT CONTRAST TECHNIQUE: Multidetector CT imaging of the chest was performed following the standard protocol without IV contrast. COMPARISON:  Cardiac CT 06/23/2017 FINDINGS: Cardiovascular: Cardiac enlargement. Small pericardial effusion. Coronary artery and aortic valve calcifications. Normal caliber thoracic aorta with aortic calcifications. Mediastinum/Nodes: Scattered lymph nodes throughout the mediastinum are upper limits of normal, likely reactive. Esophagus is decompressed. Right breast implant.  Lungs/Pleura: Small bilateral pleural effusions with basilar atelectasis. Patchy airspace infiltration mostly in the right lower lung may represent residual or recurrent pneumonia. Airways are patent. Fluid in the fissures. Upper Abdomen: No acute changes identified. Musculoskeletal: Mild pectus excavatum. Degenerative changes in the spine. Multiple small sclerotic foci could represent sclerotic metastases in a patient with history of breast cancer. Old left rib fracture and deformity. IMPRESSION: 1. Small bilateral pleural effusions with basilar atelectasis. 2. Patchy airspace disease in the right lower lung may represent residual or recurrent pneumonia. 3. Cardiac enlargement with small pericardial effusion. 4. Multiple small sclerotic foci demonstrated in the bones are nonspecific but could represent sclerotic metastasis in a patient with history of breast cancer. Bone scan correlation is suggested. Aortic Atherosclerosis (ICD10-I70.0). Electronically Signed   By: Lucienne Capers M.D.   On: 08/02/2018 06:23    EKG: Independently reviewed.  Assessment/Plan Principal Problem:   Community acquired pneumonia Active Problems:   Acute respiratory failure with hypoxia (HCC)   Hypothyroidism   Essential hypertension   Secondary cardiomyopathy (HCC)   LBBB (left bundle branch block)    Cancer of Breast T1bN0M0 triple neg S/P mastectomy/reconstruction 04/2008   Chronic diastolic CHF (congestive heart failure) (HCC)   Status post radiation therapy   Chest wall recurrence of breast cancer (HCC)   PAF (paroxysmal atrial fibrillation) (HCC)   NICM (nonischemic cardiomyopathy) ([place])   Acute Respiratory Failure with Hypoxia likely due to HCAP  CURB 65 1 Presenting now with acute symptoms including non productive cough. Recent admission for PNA in July 2019. Afebrile, Osats normal at 2 L   Lactic acid  0.96 WBC 11  Admit to Tele Obs  Oxygen   sputum cultures  IV antibiotics with per protocol with Vanc and  Maxipime Procalcitonin,Strep pneumo urine antigen,  Nebulizers as needed, with Duoneb q 6 h prn  Mucinex prn  antipyretics Repeat CBC in am   Hypertension BP   171/65  Pulse  47    Controlled Continue home anti-hypertensive medications when P >50  Add Hydralazine Q6 hours as needed for BP 180   Hyperlipidemia Continue home statins  Paroxysmal Atrial Fibrillation  LBBB CHA2DS2-VASc score 4 , on anticoagulation with Xarelto. S/p ablation sinus rhythm, ventricular bigeminy, QTC is 540.  Cardiology evaluation pending. Continue Xarelto  Of note patient on Tykosyn, QTC 540. Spoke with Pharmacy, and will have Cardiology look at EKG, med dosage, and await for recommendations. At this time will hold med until Cards consult.   Hypothyroidism: Continue home Synthroid  Diastolic CHF/ NICM: No acute decompensation weight 60.8 kg    Last 2 D echo normal systolic, EF 55 to 84%.  2 diastolic.sinus rhythm, ventricular bigeminy, QTC is 540.  Cardiology evaluation pending. Patient received 1 dose of Lasix at the ER after receiving  IV fluids. For now, continue Coreg, ACE inhibitor on hold due to hyperkalemia Obtain daily weights Monitor intake and output  Acute hyperkalemia, likely due to meds 57. Patient had Lasix 40 mg at the ED . EKG sinus rhythm, ventricular bigeminy, QTC is 540.  Cardiology evaluation pending.Trop neg   Repeat EKG as indicated Check Troponin Telemetry Hold ACE I  Recheck EKG   Check mag   DVT prophylaxis:  Xarelto  Code Status:    Full code  Family Communication:  Discussed with patient Disposition Plan: Expect patient to be discharged to home after condition improves Consults called:    Cardiology for eval QTC 540 while on TYkosyn Admission status: TEle Obs    Sharene Butters, PA-C [facility]   Amion text  [phone]   08/02/2018, 8:52 AM

## 2018-08-02 NOTE — Progress Notes (Signed)
Pharmacy Antibiotic Note  [name] is a 72 y.o. female admitted on 08/02/2018 with pneumonia.  Pharmacy has been consulted for Vancomycin dosing. WBC 11.1. Renal function age appropriate. CT with possible recurrent PNA.   Plan: Vancomycin 1000 mg IV x 1, then 500 mg IV q12h Cefepime x 1 in the ED, f/u additional gram negative coverage Trend WBC, temp, renal function  F/U infectious work-up Drug levels as indicated   Height: 5\' 1"  (154.9 cm) Weight: 134 lb (60.8 kg) IBW/kg (Calculated) : 47.8  Temp (24hrs), Avg:98 F (36.7 C), Min:98 F (36.7 C), Max:98 F (36.7 C)  Recent Labs  Lab 08/02/18 0141 08/02/18 0227 08/02/18 0356  WBC 11.1*  --   --   CREATININE 0.85  --   --   LATICACIDVEN  --  1.25 0.96    Estimated Creatinine Clearance: 50.1 mL/min (by C-G formula based on SCr of 0.85 mg/dL).    Allergies  Allergen Reactions  . Ace Inhibitors Cough  . Benazepril Cough  . Epinephrine Other (See Comments)    Feels "buzzed" and heart rate increases  . Iodinated Diagnostic Agents Other (See Comments)    Sneezing   . Potassium-Containing Compounds Nausea And Vomiting    Projectile vomiting--liquid potassium   . Prednisolone Other (See Comments)    Arrhythmia(s)  . Taxotere [Docetaxel] Other (See Comments)    Fluid overload     Narda Bonds 08/02/2018 6:43 AM

## 2018-08-02 NOTE — ED Notes (Signed)
Report called  

## 2018-08-02 NOTE — Progress Notes (Signed)
Pharmacy Antibiotic Note  [name] is a 72 y.o. female admitted on 08/02/2018 with pneumonia.  Pharmacy has been consulted for Vancomycin dosing and now Cefepime. She received 1gm IV Cefepime ~7:30AM today and 1gm IV Vancomycin has been ordered but not given yet.  WBC 11.1. Renal function age appropriate. CT with possible recurrent PNA.   Plan: - Vancomycin 500 mg IV q12h Cefepime 1gm q12 hr  Trend WBC, temp, renal function  F/U infectious work-up Drug levels as indicated   Height: 5\' 1"  (154.9 cm) Weight: 134 lb (60.8 kg) IBW/kg (Calculated) : 47.8  Temp (24hrs), Avg:97.7 F (36.5 C), Min:97.4 F (36.3 C), Max:98 F (36.7 C)  Recent Labs  Lab 08/02/18 0141 08/02/18 0227 08/02/18 0356  WBC 11.1*  --   --   CREATININE 0.85  --   --   LATICACIDVEN  --  1.25 0.96    Estimated Creatinine Clearance: 50.1 mL/min (by C-G formula based on SCr of 0.85 mg/dL).    Allergies  Allergen Reactions  . Ace Inhibitors Cough  . Benazepril Cough  . Epinephrine Other (See Comments)    Feels "buzzed" and heart rate increases  . Iodinated Diagnostic Agents Other (See Comments)    Sneezing   . Potassium-Containing Compounds Nausea And Vomiting    Projectile vomiting--liquid potassium   . Prednisolone Other (See Comments)    Arrhythmia(s)  . Taxotere [Docetaxel] Other (See Comments)    Fluid overload     Tomasita Morrow 08/02/2018 8:32 AM

## 2018-08-02 NOTE — ED Provider Notes (Signed)
[facility] EMERGENCY DEPARTMENT Provider Note   CSN: [id number] Arrival date & time: 08/02/18  0119     History   Chief Complaint Chief Complaint  Patient presents with  . Shortness of Breath    HPI [name] is a 72 y.o. female with a hx of NICM with improved EF, diastolic HF, HTN, LBBB, hypothyroidism, Hodgkin's disease, breast CA, PAF, carotid stenosis. Per record review, patient underwent radiation therapy for Hodgkin's lymphoma in 1998, mastectomy for breast cancer in 2009 and recurrent breast CA in 2013. Her previous EF has been as low as 45% in the past but has improved.  Tonight [name] presents to the Emergency Department complaining of gradual, but rapid, persistent, shortness of breath onset 9:30PM.  Pt reports [name] was recently admitted for PNA and has had persistent fatigue, but no DOE.  [name] reports this afternoon [name] was able to walk the dog (slowly) without SOB.  Tonight [name] became "uncomfortable" and felt short of breath at rest.  [name] denies fever, chills, headache, neck pain, chest pain, abd pain, N/V, sweating.  Pt reports compliance with all medications.  [name] reports a noticible increase in her PVCs tonight.  No syncope or near syncope.  No specific aggravating factors.  Pt was placed on oxygen and given 1 breathing treatment by EMS with mild improvement.    The history is provided by the patient and medical records. No language interpreter was used.    Past Medical History:  Diagnosis Date  . Anemia   . Arthritis    fingers   . Blood transfusion    1968  . Breast cancer ([place]) 2009   T1N0M0 DCIS right breast  . Breast cancer ([place]) 2013   a. reocurrence right breast;  b. s/p Taxotere, Cytoxan and XRT  . Carotid stenosis    a. dopplers 9/47:  RICA 6-54%, LICA 65-03% => repeat due in 01/2013;  b.  Carotid US (5/46): RICA 5-68%; LICA 12-75% - f/u 6 mos  . Fracture of left patella 12/2016  . History of radiation therapy 07/10/12-08/13/12   right  breast/chest wall  . Hodgkin's disease    stage 2b;  s/p XRT 1988  . HTN (hypertension)   . Hypothyroidism    2/2 XRT for Hodgkins Lymphoma  . LBBB (left bundle branch block)   . Mucositis 04/29/2012  . NICM (nonischemic cardiomyopathy) ([place])    a. EF as low as 45%;  b. LHC 5/05:  LM 10%, pLAD 30%, EF 45-50%,   c. Echo 5/13:  EF 60-65%, Gr 1 diast dysfn, MAC;   d.  Echo 11/13:  EF 60-65%, Gr 1 diast dysfn, Tr AI, mild MR;  d.  Echo 05/10/13: EF 17-00%, grade 1 diastolic dysfunction, PASP 36, small effusion.  Marland Kitchen PAF (paroxysmal atrial fibrillation) ([place])    a. dx in 2008;  b. CHADS2-VASc=5;  c. changed to [place] during admx 05/2013; d. Xarelto started 05/2013; e. s/p successful Afib ablation 06/30/2017  . PEA (Pulseless electrical activity) ([place])    a. admx 05/2013 for APE in setting of HTN emergency c/b PEA arrest and asp pneumonia, VDRF, AFib with RVR  . Raynaud's disease   . Status post radiation therapy 1988   mantle and periaortic     Patient Active Problem List   Diagnosis Date Noted  . Acute respiratory failure with hypoxia ([place])   . CAP (community acquired pneumonia) 06/16/2018  . NICM (nonischemic cardiomyopathy) ([place]) 08/15/2016  . PAF (paroxysmal atrial fibrillation) ([place])  08/14/2016  . Pneumococcal pneumonia ([place]) 05/23/2013  . Pleural effusion 05/15/2013  . Carotid artery disease ([place]) 10/26/2012  . Chest wall recurrence of breast cancer ([place]) 07/09/2012  . Dyspnea 05/09/2012  . Community acquired pneumonia 05/09/2012  . Normocytic anemia 05/09/2012  . Sinus tachycardia 04/29/2012  . Mucositis 04/29/2012  . Status post radiation therapy   . Breast mass, right 03/23/2012  . Chronic diastolic CHF (congestive heart failure) ([place]) 11/28/2011  . Carotid bruit 11/28/2011  .  Cancer of Breast T1bN0M0 triple neg S/P mastectomy/reconstruction 04/2008 11/25/2011  . Carcinoma in situ of breast 05/02/2009  . Hypothyroidism 05/02/2009  . Essential hypertension 05/02/2009  .  Secondary cardiomyopathy ([place]) 05/02/2009  . LBBB (left bundle branch block) 05/02/2009    Past Surgical History:  Procedure Laterality Date  . ATRIAL FIBRILLATION ABLATION  06/30/2017  . ATRIAL FIBRILLATION ABLATION N/A 06/30/2017   Procedure: Atrial Fibrillation Ablation;  Surgeon: Constance Haw, MD;  Location: [facility];  Service: Cardiovascular;  Laterality: N/A;  . BREAST LUMPECTOMY  2009   right, lymph node biopsy  . BTL  1971  . DILATION AND CURETTAGE OF UTERUS    . IR RADIOLOGY PERIPHERAL GUIDED IV START  06/23/2017  . IR US GUIDE VASC ACCESS LEFT  06/23/2017  . MASS EXCISION  03/28/2012   Procedure: EXCISION MASS;  Surgeon: Edward Jolly, MD;  Location: [facility];  Service: General;  Laterality: Right;  Excision right chest wall mass  . MASTECTOMY  2009   right breast  . POLYPECTOMY  1990   D&C (also in 1977 and [place])   . PORT-A-CATH REMOVAL  10/03/2012   Procedure: REMOVAL PORT-A-CATH;  Surgeon: Edward Jolly, MD;  Location: [facility];  Service: General;  Laterality: N/A;  . PORTACATH PLACEMENT  04/16/2012   Procedure: INSERTION PORT-A-CATH;  Surgeon: Edward Jolly, MD;  Location: [facility];  Service: General;  Laterality: Left;  Placement of Port-a-Cath, left subclavian  . R vein ligation & stripping  1976  . Spleenectomy  1988   for Hodgkin's disease  . Thoractomy  1968  . TISSUE EXPANDER  REMOVAL W/ REPLACEMENT OF IMPLANT    . TISSUE EXPANDER PLACEMENT     right breast  . TONSILLECTOMY AND ADENOIDECTOMY  1965     OB History   None      Home Medications    Prior to Admission medications   Medication Sig Start Date End Date Taking? Authorizing Provider  acetaminophen (TYLENOL) 325 MG tablet Take 650 mg by mouth at bedtime as needed for moderate pain.   Yes [provider]  atorvastatin (LIPITOR) 10 MG tablet Take 1 tablet (10 mg total) by mouth daily at 6 PM. 01/04/18  Yes Josue Hector, MD  b complex vitamins  capsule Take 1 capsule by mouth daily after breakfast.   Yes [provider]  calcium carbonate (OS-CAL) 600 MG TABS Take 600 mg by mouth at bedtime.    Yes [provider]  carvedilol (COREG) 12.5 MG tablet TAKE 1 TABLET(12.5 MG) BY MOUTH TWICE DAILY WITH A MEAL Patient taking differently: Take 12.5 mg by mouth 2 (two) times daily with a meal.  07/03/17  Yes Josue Hector, MD  Cholecalciferol (VITAMIN D) 2000 UNITS CAPS Take 2,000 Units by mouth daily with lunch.    Yes [provider]  dofetilide (TIKOSYN) 250 MCG capsule TAKE 1 CAPSULE(250 MCG) BY MOUTH EVERY 12 HOURS Patient taking differently: Take 250 mcg by  mouth 2 (two) times daily.  09/22/17  Yes Josue Hector, MD  levothyroxine (SYNTHROID, LEVOTHROID) 100 MCG tablet Take 100 mcg by mouth daily at 6 (six) AM.    Yes [provider]  losartan (COZAAR) 50 MG tablet TAKE 1 TABLET BY MOUTH TWICE DAILY 07/02/18  Yes Camnitz, Ocie Doyne, MD  magnesium oxide (MAG-OX 400) 400 MG tablet Take 400 mg by mouth 2 (two) times daily.    Yes [provider]  Misc Natural Products (LUTEIN 20) CAPS Take 20 mg by mouth daily with breakfast.   Yes [provider]  Multiple Vitamin (MULTIVITAMIN) tablet Take 1 tablet by mouth daily after breakfast. "Natural Vitamin"   Yes [provider]  OVER THE COUNTER MEDICATION BDZ tablets: Take 1 tablet by mouth at bedtime   Yes [provider]  OVER THE COUNTER MEDICATION Vegetarian Omega Green: Take 3 capsules by mouth daily with lunch   Yes [provider]  XARELTO 20 MG TABS tablet TAKE 1 TABLET BY MOUTH DAILY WITH DINNER Patient taking differently: Take 20 mg by mouth daily with supper.  07/27/18  Yes Camnitz, Will Hassell Done, MD  Amino Acids-Protein Hydrolys (FEEDING SUPPLEMENT, PRO-STAT SUGAR FREE 64,) LIQD Take 30 mLs by mouth 2 (two) times daily. Patient not taking: Reported on 08/02/2018 06/19/18   Jani Gravel, MD    Family  History Family History  Problem Relation Age of Onset  . Hypertension Mother   . Cancer Neg Hx     Social History Social History   Tobacco Use  . Smoking status: Never Smoker  . Smokeless tobacco: Never Used  Substance Use Topics  . Alcohol use: No  . Drug use: No     Allergies   Ace inhibitors; Benazepril; Epinephrine; Iodinated diagnostic agents; Potassium-containing compounds; Prednisolone; and Taxotere [docetaxel]   Review of Systems Review of Systems  Constitutional: Positive for fatigue. Negative for appetite change, diaphoresis, fever and unexpected weight change.  HENT: Negative for mouth sores.   Eyes: Negative for visual disturbance.  Respiratory: Positive for shortness of breath. Negative for cough, chest tightness and wheezing.   Cardiovascular: Negative for chest pain.  Gastrointestinal: Negative for abdominal pain, constipation, diarrhea, nausea and vomiting.  Endocrine: Negative for polydipsia, polyphagia and polyuria.  Genitourinary: Negative for dysuria, frequency, hematuria and urgency.  Musculoskeletal: Negative for back pain and neck stiffness.  Skin: Negative for rash.  Allergic/Immunologic: Negative for immunocompromised state.  Neurological: Negative for syncope, light-headedness and headaches.  Hematological: Does not bruise/bleed easily.  Psychiatric/Behavioral: Negative for sleep disturbance. The patient is not nervous/anxious.      Physical Exam Updated Vital Signs BP (!) 133/52   Pulse (!) 40   Temp 98 F (36.7 C) (Oral)   Resp 15   Ht _0  (1.549 m)   Wt 60.8 kg   LMP 03/08/1995   SpO2 96%   BMI 25.32 kg/m   Physical Exam  Constitutional: [name] appears well-developed and well-nourished. No distress.  Awake, alert, nontoxic appearance  HENT:  Head: Normocephalic and atraumatic.  Mouth/Throat: Oropharynx is clear and moist. No oropharyngeal exudate.  Eyes: Conjunctivae are normal. No scleral icterus.  Neck: Normal range of  motion. Neck supple.  Cardiovascular: Normal rate and intact distal pulses. An irregular rhythm present.  Pulses:      Radial pulses are 2+ on the right side, and 2+ on the left side.       Dorsalis pedis pulses are 2+ on the right side, and 2+ on  the left side.  Pulmonary/Chest: Accessory muscle usage present. No stridor. Tachypnea noted. No respiratory distress. [name] has no wheezes. [name] has rhonchi (throughout). [name] has rales ( lower and middle lobes).  Equal chest expansion  Abdominal: Soft. Bowel sounds are normal. [name] exhibits no mass. There is no tenderness. There is no rebound and no guarding.  Musculoskeletal: Normal range of motion. [name] exhibits no edema.  No calf tenderness  Neurological: [name] is alert.  Speech is clear and goal oriented Moves extremities without ataxia  Skin: Skin is warm and dry. [name] is not diaphoretic.  Psychiatric: [name] has a normal mood and affect.  Nursing note and vitals reviewed.    ED Treatments / Results  Labs (all labs ordered are listed, but only abnormal results are displayed) Labs Reviewed  CBC WITH DIFFERENTIAL/PLATELET - Abnormal; Notable for the following components:      Result Value   WBC 11.1 (*)    Neutro Abs 8.0 (*)    Basophils Absolute 0.2 (*)    All other components within normal limits  COMPREHENSIVE METABOLIC PANEL - Abnormal; Notable for the following components:   Potassium 5.7 (*)    Glucose, Bld 111 (*)    BUN 24 (*)    AST 44 (*)    Total Bilirubin 1.6 (*)    All other components within normal limits  BRAIN NATRIURETIC PEPTIDE - Abnormal; Notable for the following components:   B Natriuretic Peptide 443.6 (*)    All other components within normal limits  I-STAT CG4 LACTIC ACID, ED  I-STAT TROPONIN, ED  I-STAT CG4 LACTIC ACID, ED    EKG EKG Interpretation  Date/Time:  Thursday August 02 2018 01:48:01 EDT Ventricular Rate:  83 PR Interval:    QRS Duration: 140 QT Interval:  459 QTC Calculation: 540 R  Axis:   132 Text Interpretation:  Sinus rhythm Ventricular bigeminy Nonspecific intraventricular conduction delay Baseline wander in lead(s) V4 Confirmed by Ripley Fraise [id number]) on 08/02/2018 2:12:16 AM   Radiology Dg Chest 2 View  Result Date: 08/02/2018 CLINICAL DATA:  Shortness of breath for several hours EXAM: CHEST - 2 VIEW COMPARISON:  06/16/2018 FINDINGS: Cardiac shadow is stable. Patchy bibasilar infiltrates and small effusions are again identified and stable from the prior exam. No new focal abnormality is seen. No bony abnormality is noted. IMPRESSION: Patchy bibasilar infiltrates with associated small effusions stable from previous exam. Electronically Signed   By: Inez Catalina M.D.   On: 08/02/2018 02:06   Ct Chest Wo Contrast  Result Date: 08/02/2018 CLINICAL DATA:  Shortness of breath and cough starting tonight. History of pneumonia in July 2019. EXAM: CT CHEST WITHOUT CONTRAST TECHNIQUE: Multidetector CT imaging of the chest was performed following the standard protocol without IV contrast. COMPARISON:  Cardiac CT 06/23/2017 FINDINGS: Cardiovascular: Cardiac enlargement. Small pericardial effusion. Coronary artery and aortic valve calcifications. Normal caliber thoracic aorta with aortic calcifications. Mediastinum/Nodes: Scattered lymph nodes throughout the mediastinum are upper limits of normal, likely reactive. Esophagus is decompressed. Right breast implant. Lungs/Pleura: Small bilateral pleural effusions with basilar atelectasis. Patchy airspace infiltration mostly in the right lower lung may represent residual or recurrent pneumonia. Airways are patent. Fluid in the fissures. Upper Abdomen: No acute changes identified. Musculoskeletal: Mild pectus excavatum. Degenerative changes in the spine. Multiple small sclerotic foci could represent sclerotic metastases in a patient with history of breast cancer. Old left rib fracture and deformity. IMPRESSION: 1. Small bilateral pleural  effusions with basilar atelectasis. 2. Patchy airspace disease in  the right lower lung may represent residual or recurrent pneumonia. 3. Cardiac enlargement with small pericardial effusion. 4. Multiple small sclerotic foci demonstrated in the bones are nonspecific but could represent sclerotic metastasis in a patient with history of breast cancer. Bone scan correlation is suggested. Aortic Atherosclerosis (ICD10-I70.0). Electronically Signed   By: Lucienne Capers M.D.   On: 08/02/2018 06:23    Procedures Procedures (including critical care time)  Medications Ordered in ED Medications  ceFEPIme (MAXIPIME) 2 g in sodium chloride 0.9 % 100 mL IVPB (2 g Intravenous New Bag/Given 08/02/18 0653)  vancomycin (VANCOCIN) IVPB 1000 mg/200 mL premix (has no administration in time range)  vancomycin (VANCOCIN) 500 mg in sodium chloride 0.9 % 100 mL IVPB (has no administration in time range)  furosemide (LASIX) injection 40 mg (has no administration in time range)  ipratropium-albuterol (DUONEB) 0.5-2.5 (3) MG/3ML nebulizer solution 3 mL (3 mLs Nebulization Given 08/02/18 0618)     Initial Impression / Assessment and Plan / ED Course  I have reviewed the triage vital signs and the nursing notes.  Pertinent labs & imaging results that were available during my care of the patient were reviewed by me and considered in my medical decision making (see chart for details).  Clinical Course as of Aug 03 655  Thu Aug 02, 2018  4008 Monitor recording bradycardia due to frequent PVCs, however pulse remains in the 80s  Pulse Rate(!): 40 [HM]  0514 No hypoxia  SpO2: 96 % [HM]  0514 afebrile  Temp: 98 F (36.7 C) [HM]  0552 Pt with desaturations to 75% off oxygen before walking.   [HM]  [phone] Discussed with Dr. Hal Hope, he will admit.   [HM]    Clinical Course User Index [HM] Mayra Brahm, Gwenlyn Perking    Presents with acute shortness of breath.  [name] was recently treated for pneumonia.  [name] does not  wear oxygen at home but has required oxygen here to maintain her oxygen saturations.  Patient does have a history of CHF however her last echo showed normal EF.  Intermittent PVCs noted today.  Negative troponin.  Patient without chest pain or syncope.  Chest x-ray read by radiology as unchanged however right lobe is fuller than previous when compared to last x-ray.  Personally evaluated these images.  Question recurrent pneumonia versus CHF.  Antibiotics given.  Lasix given.  On repeat exam, patient had some mild wheezing.  Albuterol also given.  [name] will need admission for further monitoring and treatment.  The patient was discussed with and seen by Dr. Christy Gentles who agrees with the treatment plan.   Final Clinical Impressions(s) / ED Diagnoses   Final diagnoses:  Hypoxia  SOB (shortness of breath)    ED Discharge Orders    None       Loni Muse Gwenlyn Perking 08/02/18 9509    Ripley Fraise, MD 08/02/18 [phone]

## 2018-08-02 NOTE — ED Notes (Signed)
PT was off o2 in bed with no exertion; saturated at 80%. Did not attempt to ambulate. Nurse notified.

## 2018-08-02 NOTE — Consult Note (Signed)
Cardiology Consultation:   Patient ID: [name]; [id number]; July 23, 1946   Admit date: 08/02/2018 Date of Consult: 08/02/2018  Primary Care Provider: [name], MD Primary Cardiologist: Johnsie Cancel Primary Electrophysiologist:  Curt Bears   Patient Profile:   [name] is a 72 y.o. female with a hx of atrial fibrillation, nonischemic cardiomyopathy, hypertension, left bundle branch block, multiple cancers who is being seen today for the evaluation of atrial fibrillation at the request of Karmen Bongo.  History of Present Illness:   Ms. Curiale is a 72 year old female with a history of nonischemic cardiomyopathy, diastolic heart failure, hypertension, left bundle branch block, and atrial fibrillation on dofetilide.  She is also had Hodgkin's lymphoma as well as breast cancer with recurrent breast cancer in 2013 status post mastectomy and radiation.  She has had an ablation for atrial fibrillation and is on dofetilide.  She presented to the hospital with rapid persistent shortness of breath.  She has recently been discharged with pneumonia.  She was diagnosed with recurrent pneumonia.  The patient is on dofetilide for her atrial fibrillation.  Review of her EKG shows sinus rhythm with ventricular bigeminy and a prolonged QTC.  She has had an ablation for atrial fibrillation and has not had recurrence.  She has been compliant with her medications and has done well on dofetilide up to this point.  Past Medical History:  Diagnosis Date  . Anemia   . Arthritis    fingers   . Blood transfusion    1968  . Breast cancer ([place]) 2009   T1N0M0 DCIS right breast  . Breast cancer ([place]) 2013   a. reocurrence right breast;  b. s/p Taxotere, Cytoxan and XRT  . Carotid stenosis    a. dopplers 4/69:  RICA 6-29%, LICA 52-84% => repeat due in 01/2013;  b.  Carotid US (1/32): RICA 4-40%; LICA 10-27% - f/u 6 mos  . Fracture of left patella 12/2016  . History of radiation therapy 07/10/12-08/13/12    right breast/chest wall  . Hodgkin's disease    stage 2b;  s/p XRT 1988  . HTN (hypertension)   . Hypothyroidism    2/2 XRT for Hodgkins Lymphoma  . LBBB (left bundle branch block)   . Mucositis 04/29/2012  . NICM (nonischemic cardiomyopathy) ([place])    a. EF as low as 45%;  b. LHC 5/05:  LM 10%, pLAD 30%, EF 45-50%,   c. Echo 5/13:  EF 60-65%, Gr 1 diast dysfn, MAC;   d.  Echo 11/13:  EF 60-65%, Gr 1 diast dysfn, Tr AI, mild MR;  d.  Echo 05/10/13: EF 25-36%, grade 1 diastolic dysfunction, PASP 36, small effusion.  Marland Kitchen PAF (paroxysmal atrial fibrillation) ([place])    a. dx in 2008;  b. CHADS2-VASc=5;  c. changed to [place] during admx 05/2013; d. Xarelto started 05/2013; e. s/p successful Afib ablation 06/30/2017  . PEA (Pulseless electrical activity) ([place])    a. admx 05/2013 for APE in setting of HTN emergency c/b PEA arrest and asp pneumonia, VDRF, AFib with RVR  . Pneumonia 08/02/2018  . Raynaud's disease   . Status post radiation therapy 1988   mantle and periaortic     Past Surgical History:  Procedure Laterality Date  . ATRIAL FIBRILLATION ABLATION  06/30/2017  . ATRIAL FIBRILLATION ABLATION N/A 06/30/2017   Procedure: Atrial Fibrillation Ablation;  Surgeon: Constance Haw, MD;  Location: [facility];  Service: Cardiovascular;  Laterality: N/A;  . BREAST LUMPECTOMY  2009   right,  lymph node biopsy  . BTL  1971  . DILATION AND CURETTAGE OF UTERUS    . IR RADIOLOGY PERIPHERAL GUIDED IV START  06/23/2017  . IR US GUIDE VASC ACCESS LEFT  06/23/2017  . MASS EXCISION  03/28/2012   Procedure: EXCISION MASS;  Surgeon: Edward Jolly, MD;  Location: [facility];  Service: General;  Laterality: Right;  Excision right chest wall mass  . MASTECTOMY  2009   right breast  . POLYPECTOMY  1990   D&C (also in 1977 and [place])   . PORT-A-CATH REMOVAL  10/03/2012   Procedure: REMOVAL PORT-A-CATH;  Surgeon: Edward Jolly, MD;  Location: [facility];  Service: General;   Laterality: N/A;  . PORTACATH PLACEMENT  04/16/2012   Procedure: INSERTION PORT-A-CATH;  Surgeon: Edward Jolly, MD;  Location: [facility];  Service: General;  Laterality: Left;  Placement of Port-a-Cath, left subclavian  . R vein ligation & stripping  1976  . Spleenectomy  1988   for Hodgkin's disease  . Thoractomy  1968  . TISSUE EXPANDER  REMOVAL W/ REPLACEMENT OF IMPLANT    . TISSUE EXPANDER PLACEMENT     right breast  . TONSILLECTOMY AND ADENOIDECTOMY  1965     Home Medications:  Prior to Admission medications   Medication Sig Start Date End Date Taking? Authorizing Provider  acetaminophen (TYLENOL) 325 MG tablet Take 650 mg by mouth at bedtime as needed for moderate pain.   Yes [provider]  atorvastatin (LIPITOR) 10 MG tablet Take 1 tablet (10 mg total) by mouth daily at 6 PM. 01/04/18  Yes Josue Hector, MD  b complex vitamins capsule Take 1 capsule by mouth daily after breakfast.   Yes [provider]  calcium carbonate (OS-CAL) 600 MG TABS Take 600 mg by mouth at bedtime.    Yes [provider]  Cholecalciferol (VITAMIN D) 2000 UNITS CAPS Take 2,000 Units by mouth daily with lunch.    Yes [provider]  dofetilide (TIKOSYN) 250 MCG capsule TAKE 1 CAPSULE(250 MCG) BY MOUTH EVERY 12 HOURS Patient taking differently: Take 250 mcg by mouth 2 (two) times daily.  09/22/17  Yes Josue Hector, MD  levothyroxine (SYNTHROID, LEVOTHROID) 100 MCG tablet Take 100 mcg by mouth daily at 6 (six) AM.    Yes [provider]  losartan (COZAAR) 50 MG tablet TAKE 1 TABLET BY MOUTH TWICE DAILY 07/02/18  Yes Ellenie Salome, Ocie Doyne, MD  magnesium oxide (MAG-OX 400) 400 MG tablet Take 400 mg by mouth 2 (two) times daily.    Yes [provider]  Misc Natural Products (LUTEIN 20) CAPS Take 20 mg by mouth daily with breakfast.   Yes [provider]  Multiple Vitamin (MULTIVITAMIN) tablet Take 1 tablet by mouth daily after breakfast.  "Natural Vitamin"   Yes [provider]  OVER THE COUNTER MEDICATION BDZ tablets: Take 1 tablet by mouth at bedtime   Yes [provider]  OVER THE COUNTER MEDICATION Vegetarian Omega Green: Take 3 capsules by mouth daily with lunch   Yes [provider]  XARELTO 20 MG TABS tablet TAKE 1 TABLET BY MOUTH DAILY WITH DINNER Patient taking differently: Take 20 mg by mouth daily with supper.  07/27/18  Yes Daymon Hora Hassell Done, MD  Amino Acids-Protein Hydrolys (FEEDING SUPPLEMENT, PRO-STAT SUGAR FREE 64,) LIQD Take 30 mLs by mouth 2 (two) times daily. Patient not taking: Reported on 08/02/2018 06/19/18   Jani Gravel, MD  carvedilol ([place]) 12.5  MG tablet TAKE 1 TABLET(12.5 MG) BY MOUTH TWICE DAILY WITH A MEAL 08/02/18   Josue Hector, MD    Inpatient Medications: Scheduled Meds: . atorvastatin  10 mg Oral q1800  . carvedilol  12.5 mg Oral BID WC  . [START ON 08/03/2018] levothyroxine  100 mcg Oral QAC breakfast  . magnesium oxide  400 mg Oral BID  . rivaroxaban  20 mg Oral Q supper   Continuous Infusions: . azithromycin 500 mg (08/02/18 1510)  . cefTRIAXone (ROCEPHIN)  IV 1 g (08/02/18 1421)   PRN Meds: guaiFENesin, ipratropium-albuterol  Allergies:    Allergies  Allergen Reactions  . Ace Inhibitors Cough  . Benazepril Cough  . Epinephrine Other (See Comments)    Feels "buzzed" and heart rate increases  . Iodinated Diagnostic Agents Other (See Comments)    Sneezing   . Potassium-Containing Compounds Nausea And Vomiting    Projectile vomiting--liquid potassium   . Prednisolone Other (See Comments)    Arrhythmia(s)  . Taxotere [Docetaxel] Other (See Comments)    Fluid overload    Social History:   Social History   Socioeconomic History  . Marital status: Widowed    Spouse name: Donnie  . Number of children: Not on file  . Years of education: Not on file  . Highest education level: Not on file  Occupational History  . Occupation: Conservation officer, nature: [employer]  Social Needs  . Financial resource strain: Not on file  . Food insecurity:    Worry: Not on file    Inability: Not on file  . Transportation needs:    Medical: Not on file    Non-medical: Not on file  Tobacco Use  . Smoking status: Never Smoker  . Smokeless tobacco: Never Used  Substance and Sexual Activity  . Alcohol use: No  . Drug use: No  . Sexual activity: Not Currently    Birth control/protection: None    Comment: post menopausal, HRT x 8 yrs  Lifestyle  . Physical activity:    Days per week: Not on file    Minutes per session: Not on file  . Stress: Not on file  Relationships  . Social connections:    Talks on phone: Not on file    Gets together: Not on file    Attends religious service: Not on file    Active member of club or organization: Not on file    Attends meetings of clubs or organizations: Not on file    Relationship status: Not on file  . Intimate partner violence:    Fear of current or ex partner: Not on file    Emotionally abused: Not on file    Physically abused: Not on file    Forced sexual activity: Not on file  Other Topics Concern  . Not on file  Social History Narrative   Regular exercise. Retired and married.     Family History:    Family History  Problem Relation Age of Onset  . Hypertension Mother   . Cancer Neg Hx      ROS:  Please see the history of present illness.   All other ROS reviewed and negative.     Physical Exam/Data:   Vitals:   08/02/18 0630 08/02/18 0700 08/02/18 0803 08/02/18 1036  BP: (!) 160/63 124/87 (!) 171/65   Pulse: (!) 41 82 (!) 47 88  Resp: 16 18    Temp:   (!) 97.4 F (36.3 C)  TempSrc:   Oral   SpO2: 99% 98% 98%   Weight:      Height:        Intake/Output Summary (Last 24 hours) at 08/02/2018 1639 Last data filed at 08/02/2018 1443 Gross per 24 hour  Intake 240 ml  Output 900 ml  Net -660 ml   Filed Weights   08/02/18 0126  Weight: 60.8 kg   Body mass  index is 25.32 kg/m.  General:  Well nourished, well developed, in no acute distress HEENT: normal Lymph: no adenopathy Neck: no JVD Endocrine:  No thryomegaly Vascular: No carotid bruits; FA pulses 2+ bilaterally without bruits  Cardiac:  normal S1, S2; RRR; no murmur  Lungs:  clear to auscultation bilaterally, no wheezing, rhonchi or rales  Abd: soft, nontender, no hepatomegaly  Ext: no edema Musculoskeletal:  No deformities, BUE and BLE strength normal and equal Skin: warm and dry  Neuro:  CNs 2-12 intact, no focal abnormalities noted Psych:  Normal affect   EKG:  The EKG was personally reviewed and demonstrates: Sinus rhythm with PVCs Telemetry:  Telemetry was personally reviewed and demonstrates: Sinus rhythm with PVCs  Relevant CV Studies: TTE 05/03/18 - Left ventricle: The cavity size was normal. Wall thickness was   normal. Systolic function was normal. The estimated ejection   fraction was in the range of 55% to 60%. Wall motion was normal;   there were no regional wall motion abnormalities. Features are   consistent with a pseudonormal left ventricular filling pattern,   with concomitant abnormal relaxation and increased filling   pressure (grade 2 diastolic dysfunction). - Mitral valve: There was mild regurgitation. - Right atrium: The atrium was mildly dilated.  Laboratory Data:  Chemistry Recent Labs  Lab 08/02/18 0141 08/02/18 1117  NA 139  --   K 5.7* 4.7  CL 104  --   CO2 26  --   GLUCOSE 111*  --   BUN 24*  --   CREATININE 0.85  --   CALCIUM 9.3  --   GFRNONAA >60  --   GFRAA >60  --   ANIONGAP 9  --     Recent Labs  Lab 08/02/18 0141  PROT 7.0  ALBUMIN 3.9  AST 44*  ALT 25  ALKPHOS 108  BILITOT 1.6*   Hematology Recent Labs  Lab 08/02/18 0141  WBC 11.1*  RBC 4.68  HGB 13.9  HCT 43.9  MCV 93.8  MCH 29.7  MCHC 31.7  RDW 14.8  PLT 391   Cardiac EnzymesNo results for input(s): TROPONINI in the last 168 hours.  Recent Labs  Lab  08/02/18 0318  TROPIPOC 0.01    BNP Recent Labs  Lab 08/02/18 0257  BNP 443.6*    DDimer No results for input(s): DDIMER in the last 168 hours.  Radiology/Studies:  Dg Chest 2 View  Result Date: 08/02/2018 CLINICAL DATA:  Shortness of breath for several hours EXAM: CHEST - 2 VIEW COMPARISON:  06/16/2018 FINDINGS: Cardiac shadow is stable. Patchy bibasilar infiltrates and small effusions are again identified and stable from the prior exam. No new focal abnormality is seen. No bony abnormality is noted. IMPRESSION: Patchy bibasilar infiltrates with associated small effusions stable from previous exam. Electronically Signed   By: Inez Catalina M.D.   On: 08/02/2018 02:06   Ct Chest Wo Contrast  Result Date: 08/02/2018 CLINICAL DATA:  Shortness of breath and cough starting tonight. History of pneumonia in July 2019. EXAM: CT CHEST WITHOUT CONTRAST TECHNIQUE: Multidetector  CT imaging of the chest was performed following the standard protocol without IV contrast. COMPARISON:  Cardiac CT 06/23/2017 FINDINGS: Cardiovascular: Cardiac enlargement. Small pericardial effusion. Coronary artery and aortic valve calcifications. Normal caliber thoracic aorta with aortic calcifications. Mediastinum/Nodes: Scattered lymph nodes throughout the mediastinum are upper limits of normal, likely reactive. Esophagus is decompressed. Right breast implant. Lungs/Pleura: Small bilateral pleural effusions with basilar atelectasis. Patchy airspace infiltration mostly in the right lower lung may represent residual or recurrent pneumonia. Airways are patent. Fluid in the fissures. Upper Abdomen: No acute changes identified. Musculoskeletal: Mild pectus excavatum. Degenerative changes in the spine. Multiple small sclerotic foci could represent sclerotic metastases in a patient with history of breast cancer. Old left rib fracture and deformity. IMPRESSION: 1. Small bilateral pleural effusions with basilar atelectasis. 2. Patchy  airspace disease in the right lower lung may represent residual or recurrent pneumonia. 3. Cardiac enlargement with small pericardial effusion. 4. Multiple small sclerotic foci demonstrated in the bones are nonspecific but could represent sclerotic metastasis in a patient with history of breast cancer. Bone scan correlation is suggested. Aortic Atherosclerosis (ICD10-I70.0). Electronically Signed   By: Lucienne Capers M.D.   On: 08/02/2018 06:23    Assessment and Plan:   1. Paroxysmal atrial fibrillation: At this point, would continue her Xarelto.  Her QTC is prolonged on her dofetilide.  It has been relatively normal in the past.  I am not sure if this is due to her current illness or her antibiotics, though that is less likely.  She has had an ablation for her atrial fibrillation 1 year ago and has not had recurrence.  In clinic we had discussed stopping her dofetilide, which I feel is an acceptable therapy at this point.  I Jimmie Dattilio see her back in clinic at her regularly scheduled time.  She knows to call back if she has further episodes of atrial fibrillation. This patients CHA2DS2-VASc Score and unadjusted Ischemic Stroke Rate (% per year) is equal to 3.2 % stroke rate/year from a score of 3  Above score calculated as 1 point each if present [CHF, HTN, DM, Vascular=MI/PAD/Aortic Plaque, Age if 65-74, or Female] Above score calculated as 2 points each if present [Age > 75, or Stroke/TIA/TE] 2. PVCs: Has had a history of elevated PVCs.  She wore a Holter monitor that showed a burden of less than 1%.  She may need a repeat Holter in the future should her PVCs continue.  No inpatient work-up at this time.  [facility] Taijah Macrae sign off.   Medication Recommendations: Stop dofetilide Other recommendations (labs, testing, etc): None Follow up as an outpatient: Previously scheduled EP appointment  For questions or updates, please contact [facility] Please consult [email or web address] for contact info under  Cardiology/STEMI.   Signed, Aubree Doody Meredith Leeds, MD  08/02/2018 4:39 PM

## 2018-08-02 NOTE — ED Triage Notes (Addendum)
Pt BIB [facility] from home, c/o shortness of breath, and cough that started tonight. Hx pneumonia in July 2019. Denies chest pain, or fevers. Given 5mg  albuterol and 0.5mg  atrovent PTA.

## 2018-08-03 DIAGNOSIS — J189 Pneumonia, unspecified organism: Secondary | ICD-10-CM | POA: Diagnosis not present

## 2018-08-03 DIAGNOSIS — J9601 Acute respiratory failure with hypoxia: Secondary | ICD-10-CM | POA: Diagnosis not present

## 2018-08-03 LAB — COMPREHENSIVE METABOLIC PANEL
ALK PHOS: 89 U/L (ref 38–126)
ALT: 22 U/L (ref 0–44)
AST: 21 U/L (ref 15–41)
Albumin: 3.2 g/dL — ABNORMAL LOW (ref 3.5–5.0)
Anion gap: 8 (ref 5–15)
BILIRUBIN TOTAL: 0.8 mg/dL (ref 0.3–1.2)
BUN: 18 mg/dL (ref 8–23)
CALCIUM: 9.1 mg/dL (ref 8.9–10.3)
CO2: 29 mmol/L (ref 22–32)
CREATININE: 0.87 mg/dL (ref 0.44–1.00)
Chloride: 104 mmol/L (ref 98–111)
Glucose, Bld: 97 mg/dL (ref 70–99)
Potassium: 4.1 mmol/L (ref 3.5–5.1)
Sodium: 141 mmol/L (ref 135–145)
TOTAL PROTEIN: 5.9 g/dL — AB (ref 6.5–8.1)

## 2018-08-03 LAB — CBC
HCT: 40.5 % (ref 36.0–46.0)
Hemoglobin: 13.3 g/dL (ref 12.0–15.0)
MCH: 30.3 pg (ref 26.0–34.0)
MCHC: 32.8 g/dL (ref 30.0–36.0)
MCV: 92.3 fL (ref 78.0–100.0)
PLATELETS: 358 10*3/uL (ref 150–400)
RBC: 4.39 MIL/uL (ref 3.87–5.11)
RDW: 14.8 % (ref 11.5–15.5)
WBC: 10.1 10*3/uL (ref 4.0–10.5)

## 2018-08-03 MED ORDER — AMOXICILLIN-POT CLAVULANATE 875-125 MG PO TABS: 1.0000 | ORAL_TABLET | Freq: Two times a day (BID) | ORAL | 0 refills | Status: AC

## 2018-08-03 MED ORDER — AZITHROMYCIN 500 MG PO TABS: 500.0000 mg | ORAL_TABLET | Freq: Once | ORAL | Status: DC

## 2018-08-03 MED ORDER — GUAIFENESIN ER 600 MG PO TB12: 600.0000 mg | ORAL_TABLET | Freq: Two times a day (BID) | ORAL | 0 refills | Status: DC | PRN

## 2018-08-03 MED ORDER — CARVEDILOL 12.5 MG PO TABS: 12.5000 mg | ORAL_TABLET | Freq: Two times a day (BID) | ORAL | 0 refills | Status: DC

## 2018-08-03 MED ORDER — ALBUTEROL SULFATE HFA 108 (90 BASE) MCG/ACT IN AERS: 2.0000 | INHALATION_SPRAY | Freq: Four times a day (QID) | RESPIRATORY_TRACT | 2 refills | Status: DC | PRN

## 2018-08-03 MED ORDER — SACCHAROMYCES BOULARDII 250 MG PO CAPS: 250.0000 mg | ORAL_CAPSULE | Freq: Two times a day (BID) | ORAL | 0 refills | Status: DC

## 2018-08-03 NOTE — Progress Notes (Signed)
Pt discharge instructions reviewed with pt. Pt verbalizes understanding and states she has no questions. Pt belongings with pt. Pt is not in distress. Pt's son is driving her home.

## 2018-08-03 NOTE — Consult Note (Addendum)
   [facility] Inpatient Consult   08/03/2018  KYNZEE DEVINNEY [birth year] [phone]   Patient was assessed for [facility] for community services. Patient was previously active with [facility].  Met with patient at bedside regarding being restarted with [facility] services.  Patient states she would like to get the EMMI calls restarted.  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 Nurse Tech brought in a warm blanket as well as her nurse. Continued to talk about [facility] services when she started crying out, "I think my IV is infiltrated and the pain is so bad I feel like passing out.  Help! Help!"  Staff contacted.  Primary Care Provider:  Dr. [name], confirmed.  This office is listed to provide the Transition of Care follow up.   Chart review reveals per MD notes: MERIKAY LESNIEWSKI is a 72 y.o. female with a hx of atrial fibrillation, nonischemic cardiomyopathy, hypertension, left bundle branch block, multiple cancers who is being seen today for the evaluation of atrial fibrillation.  Assigned the patient to EMMI Pneumonia follow up calls.  Patient given a brochure and 24 hour nurse advise line with contact information.  Of note, [facility] Care Management services does not replace or interfere with any services that are arranged by inpatient case management or social work. For additional questions or referrals please contact:  Natividad Brood, RN BSN [facility] Liaison  [phone] business mobile phone Toll free office [phone]

## 2018-08-03 NOTE — Discharge Summary (Signed)
Physician Discharge Summary  [name] IDP:[id number] DOB: 05/13/1946 DOA: 08/02/2018  PCP: [name], MD  Admit date: 08/02/2018 Discharge date: 08/03/2018  Admitted From: Home  Disposition:  Home   Recommendations for Outpatient Follow-up:  1. Follow up with PCP in 1-2 weeks 2. Please obtain BMP/CBC in one week 3. Follow up with cardiology for further care of arrhythmia.     Discharge Condition:stable.  CODE STATUS: full code.  Diet recommendation: Heart Healthy  Brief/Interim Summary: [name] is a 72 y.o. female with a Past Medical History of recurrent breast CA s/p radiation therapy; HTN emergency with PEA arrest in 2104; afib; hypothyroidism; and HTN who presents with acute onset of SOB overnight.  She has had cough for several days.  No fevers.  She felt like she was volume overloaded.  BNP 443.6, prior 200.  WBC 11.1. Negative lactate.  Negative procalcitonin.  Chest CT with residual or recurrent RLL PNA.  It also showed multiple small sclerotic foci in the bones - nonspecific but could represent sclerotic metastasis given her h/o breast CA. Echo in 5/19 showed grade 2 diastolic CHF.    1-Acute hypoxic respiratory failure; secondary to PNA.  Possible aspiration PNA.  Patient reported aspiration events after eating water melon, also episode after she lying down in bed, after eating.  I discussed with her aspiration precautions. She denies cough after eating.  She will take her antiacids, that she has at home  She afebrile, feeling better, dyspnea improved. She will be discharge on Augmentin for 5 days  and albuterol PRN   2-Hyperkalemia; hold cozaar at discharge. SBP soft.   3-Paroxysmal A fib, PVCs. Left BBB;; She was seen by Dr Shonna Chock with EP.  He recommend to stop Tykosin. Continue with xarelto.   Discharge Diagnoses:  Principal Problem:   HCAP (healthcare-associated pneumonia) Active Problems:   Hypothyroidism   Essential hypertension   Secondary  cardiomyopathy ([place])   LBBB (left bundle branch block)    Cancer of Breast T1bN0M0 triple neg S/P mastectomy/reconstruction 04/2008   Chronic diastolic CHF (congestive heart failure) (HCC)   Status post radiation therapy   Chest wall recurrence of breast cancer (HCC)   PAF (paroxysmal atrial fibrillation) (HCC)   NICM (nonischemic cardiomyopathy) ([place])   Acute respiratory failure with hypoxia [facility])    Discharge Instructions  Discharge Instructions    Diet - low sodium heart healthy   Complete by:  As directed    Increase activity slowly   Complete by:  As directed      Allergies as of 08/03/2018      Reactions   Ace Inhibitors Cough   Benazepril Cough   Epinephrine Other (See Comments)   Feels "buzzed" and heart rate increases   Iodinated Diagnostic Agents Other (See Comments)   Sneezing   Potassium-containing Compounds Nausea And Vomiting   Projectile vomiting--liquid potassium    Prednisolone Other (See Comments)   Arrhythmia(s)   Taxotere [docetaxel] Other (See Comments)   Fluid overload      Medication List    STOP taking these medications   dofetilide 250 MCG capsule Commonly known as:  TIKOSYN   feeding supplement (PRO-STAT SUGAR FREE 64) Liqd   losartan 50 MG tablet Commonly known as:  COZAAR   LUTEIN 20 Caps     TAKE these medications   acetaminophen 325 MG tablet Commonly known as:  TYLENOL Take 650 mg by mouth at bedtime as needed for moderate pain.   albuterol 108 (90 Base)  MCG/ACT inhaler Commonly known as:  PROVENTIL HFA;VENTOLIN HFA Inhale 2 puffs into the lungs every 6 (six) hours as needed for wheezing or shortness of breath.   amoxicillin-clavulanate 875-125 MG tablet Commonly known as:  AUGMENTIN Take 1 tablet by mouth 2 (two) times daily for 5 days.   atorvastatin 10 MG tablet Commonly known as:  LIPITOR Take 1 tablet (10 mg total) by mouth daily at 6 PM.   b complex vitamins capsule Take 1 capsule by mouth daily after breakfast.    calcium carbonate 600 MG Tabs tablet Commonly known as:  OS-CAL Take 600 mg by mouth at bedtime.   carvedilol 12.5 MG tablet Commonly known as:  COREG Take 1 tablet (12.5 mg total) by mouth 2 (two) times daily with a meal.   guaiFENesin 600 MG 12 hr tablet Commonly known as:  MUCINEX Take 1 tablet (600 mg total) by mouth 2 (two) times daily as needed for to loosen phlegm.   levothyroxine 100 MCG tablet Commonly known as:  SYNTHROID, LEVOTHROID Take 100 mcg by mouth daily at 6 (six) AM.   MAG-OX 400 400 MG tablet Generic drug:  magnesium oxide Take 400 mg by mouth 2 (two) times daily.   multivitamin tablet Take 1 tablet by mouth daily after breakfast. "Natural Vitamin"   OVER THE COUNTER MEDICATION BDZ tablets: Take 1 tablet by mouth at bedtime   OVER THE COUNTER MEDICATION Vegetarian Omega Green: Take 3 capsules by mouth daily with lunch   saccharomyces boulardii 250 MG capsule Commonly known as:  FLORASTOR Take 1 capsule (250 mg total) by mouth 2 (two) times daily.   Vitamin D 2000 units Caps Take 2,000 Units by mouth daily with lunch.   XARELTO 20 MG Tabs tablet Generic drug:  rivaroxaban TAKE 1 TABLET BY MOUTH DAILY WITH DINNER What changed:  See the new instructions.       Allergies  Allergen Reactions  . Ace Inhibitors Cough  . Benazepril Cough  . Epinephrine Other (See Comments)    Feels "buzzed" and heart rate increases  . Iodinated Diagnostic Agents Other (See Comments)    Sneezing   . Potassium-Containing Compounds Nausea And Vomiting    Projectile vomiting--liquid potassium   . Prednisolone Other (See Comments)    Arrhythmia(s)  . Taxotere [Docetaxel] Other (See Comments)    Fluid overload    Consultations:  Cardiology    Procedures/Studies: Dg Chest 2 View  Result Date: 08/02/2018 CLINICAL DATA:  Shortness of breath for several hours EXAM: CHEST - 2 VIEW COMPARISON:  06/16/2018 FINDINGS: Cardiac shadow is stable. Patchy bibasilar  infiltrates and small effusions are again identified and stable from the prior exam. No new focal abnormality is seen. No bony abnormality is noted. IMPRESSION: Patchy bibasilar infiltrates with associated small effusions stable from previous exam. Electronically Signed   By: Inez Catalina M.D.   On: 08/02/2018 02:06   Ct Chest Wo Contrast  Result Date: 08/02/2018 CLINICAL DATA:  Shortness of breath and cough starting tonight. History of pneumonia in July 2019. EXAM: CT CHEST WITHOUT CONTRAST TECHNIQUE: Multidetector CT imaging of the chest was performed following the standard protocol without IV contrast. COMPARISON:  Cardiac CT 06/23/2017 FINDINGS: Cardiovascular: Cardiac enlargement. Small pericardial effusion. Coronary artery and aortic valve calcifications. Normal caliber thoracic aorta with aortic calcifications. Mediastinum/Nodes: Scattered lymph nodes throughout the mediastinum are upper limits of normal, likely reactive. Esophagus is decompressed. Right breast implant. Lungs/Pleura: Small bilateral pleural effusions with basilar atelectasis. Patchy airspace infiltration mostly in  the right lower lung may represent residual or recurrent pneumonia. Airways are patent. Fluid in the fissures. Upper Abdomen: No acute changes identified. Musculoskeletal: Mild pectus excavatum. Degenerative changes in the spine. Multiple small sclerotic foci could represent sclerotic metastases in a patient with history of breast cancer. Old left rib fracture and deformity. IMPRESSION: 1. Small bilateral pleural effusions with basilar atelectasis. 2. Patchy airspace disease in the right lower lung may represent residual or recurrent pneumonia. 3. Cardiac enlargement with small pericardial effusion. 4. Multiple small sclerotic foci demonstrated in the bones are nonspecific but could represent sclerotic metastasis in a patient with history of breast cancer. Bone scan correlation is suggested. Aortic Atherosclerosis (ICD10-I70.0).  Electronically Signed   By: Lucienne Capers M.D.   On: 08/02/2018 06:23      Subjective: Feeling better, cough improved, dyspnea improved.   Discharge Exam: Vitals:   08/03/18 0810 08/03/18 0938  BP: (!) 131/51 (!) 106/48  Pulse: (!) 40 87  Resp: 19   Temp: 98.6 F (37 C)   SpO2: 97%    Vitals:   08/02/18 2341 08/03/18 0422 08/03/18 0810 08/03/18 0938  BP: (!) 131/53 129/63 (!) 131/51 (!) 106/48  Pulse: (!) 41 75 (!) 40 87  Resp: 18 16 19    Temp: 98.1 F (36.7 C) 98 F (36.7 C) 98.6 F (37 C)   TempSrc: Oral Oral Oral   SpO2: 96% 94% 97%   Weight:  60.9 kg    Height:        General: Pt is alert, awake, not in acute distress Cardiovascular: RRR, S1/S2 +, no rubs, no gallops Respiratory: CTA bilaterally, no wheezing, no rhonchi Abdominal: Soft, NT, ND, bowel sounds + Extremities: no edema, no cyanosis    The results of significant diagnostics from this hospitalization (including imaging, microbiology, ancillary and laboratory) are listed below for reference.     Microbiology: No results found for this or any previous visit (from the past 240 hour(s)).   Labs: BNP (last 3 results) Recent Labs    06/16/18 0651 08/02/18 0257  BNP 237.7* 527.7*   Basic Metabolic Panel: Recent Labs  Lab 08/02/18 0141 08/02/18 0917 08/02/18 1117 08/03/18 0532  NA 139  --   --  141  K 5.7*  --  4.7 4.1  CL 104  --   --  104  CO2 26  --   --  29  GLUCOSE 111*  --   --  97  BUN 24*  --   --  18  CREATININE 0.85  --   --  0.87  CALCIUM 9.3  --   --  9.1  MG  --  2.0  --   --    Liver Function Tests: Recent Labs  Lab 08/02/18 0141 08/03/18 0532  AST 44* 21  ALT 25 22  ALKPHOS 108 89  BILITOT 1.6* 0.8  PROT 7.0 5.9*  ALBUMIN 3.9 3.2*   No results for input(s): LIPASE, AMYLASE in the last 168 hours. No results for input(s): AMMONIA in the last 168 hours. CBC: Recent Labs  Lab 08/02/18 0141 08/03/18 0532  WBC 11.1* 10.1  NEUTROABS 8.0*  --   HGB 13.9 13.3   HCT 43.9 40.5  MCV 93.8 92.3  PLT 391 358   Cardiac Enzymes: No results for input(s): CKTOTAL, CKMB, CKMBINDEX, TROPONINI in the last 168 hours. BNP: Invalid input(s): POCBNP CBG: No results for input(s): GLUCAP in the last 168 hours. D-Dimer No results for input(s): DDIMER in the  last 72 hours. Hgb A1c No results for input(s): HGBA1C in the last 72 hours. Lipid Profile No results for input(s): CHOL, HDL, LDLCALC, TRIG, CHOLHDL, LDLDIRECT in the last 72 hours. Thyroid function studies No results for input(s): TSH, T4TOTAL, T3FREE, THYROIDAB in the last 72 hours.  Invalid input(s): FREET3 Anemia work up No results for input(s): VITAMINB12, FOLATE, FERRITIN, TIBC, IRON, RETICCTPCT in the last 72 hours. Urinalysis    Component Value Date/Time   COLORURINE YELLOW 06/16/2018 0845   APPEARANCEUR CLEAR 06/16/2018 0845   LABSPEC 1.023 06/16/2018 0845   PHURINE 5.0 06/16/2018 0845   GLUCOSEU NEGATIVE 06/16/2018 0845   HGBUR MODERATE (A) 06/16/2018 0845   BILIRUBINUR NEGATIVE 06/16/2018 0845   KETONESUR 20 (A) 06/16/2018 0845   PROTEINUR NEGATIVE 06/16/2018 0845   UROBILINOGEN 0.2 05/10/2013 0438   NITRITE NEGATIVE 06/16/2018 0845   LEUKOCYTESUR NEGATIVE 06/16/2018 0845   Sepsis Labs Invalid input(s): PROCALCITONIN,  WBC,  LACTICIDVEN Microbiology No results found for this or any previous visit (from the past 240 hour(s)).   Time coordinating discharge: 35 minutes,  SIGNED:   Elmarie Shiley, MD  [facility] 08/03/2018, 11:08 AM Pager   If 7PM-7AM, please contact night-coverage [email or web address] Password [email or web address]

## 2018-08-06 ENCOUNTER — Telehealth: Payer: Self-pay

## 2018-08-06 NOTE — Telephone Encounter (Signed)
Spoke with patient concerning a f/u hospital appointment. Per RN Kathlee Nations) okayed to schedule the patient due to she just had been released from the hospital. Patient also CT done and requesting that Gudena review them. Per 8/19 vm return

## 2018-08-07 ENCOUNTER — Other Ambulatory Visit: Payer: Self-pay

## 2018-08-07 DIAGNOSIS — C50011 Malignant neoplasm of nipple and areola, right female breast: Secondary | ICD-10-CM

## 2018-08-07 NOTE — Progress Notes (Signed)
Pt called to find out Dr.Gudena's opinion on her latest CT scan results. Per Dr.Gudena review of Ct, obtained order for Bone scan to be performed next. Sent PA and will call pt to notify her of appt.

## 2018-08-08 DIAGNOSIS — R197 Diarrhea, unspecified: Secondary | ICD-10-CM | POA: Diagnosis not present

## 2018-08-08 DIAGNOSIS — Z86010 Personal history of colonic polyps: Secondary | ICD-10-CM | POA: Diagnosis not present

## 2018-08-08 DIAGNOSIS — R1313 Dysphagia, pharyngeal phase: Secondary | ICD-10-CM | POA: Diagnosis not present

## 2018-08-10 ENCOUNTER — Telehealth: Payer: Self-pay | Admitting: Hematology and Oncology

## 2018-08-10 ENCOUNTER — Ambulatory Visit: Payer: Medicare Other | Admitting: Hematology and Oncology

## 2018-08-10 NOTE — Telephone Encounter (Signed)
Per 8/22 sch msg.  Called patient and set up f/u appt with VG for 9/3.

## 2018-08-10 NOTE — Telephone Encounter (Signed)
error 

## 2018-08-10 NOTE — Telephone Encounter (Signed)
Error

## 2018-08-13 ENCOUNTER — Other Ambulatory Visit: Payer: Self-pay

## 2018-08-13 NOTE — Patient Outreach (Addendum)
[name]) Care Management  08/13/2018  [name] 1946/05/09 [id number]  EMMI: pneumonia red alert Referral date: 08/13/18 Referral reason: Been to follow up appointment: no,    Feeling better overall: no, Wakes up with shortness of breath when lying flat: YES Insurance: Medicare Day # 3  Telephone call to patient regarding EMMI pneumonia red alert. HIPAA verified with patient. Explained reason for call.   Patient states she has a follow up appointment with her primary MD on 08/16/18 and a follow up with her heart doctor on 08/22/18. Patient reports having transportation to her appointments.   Patient states she is still very tired.  She reports she is able to walk the dog daily. Patient denied having any in home services. Patient states she doesn't feel she needs any services. 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  Patient reports she still has a cough on occasion.  Denies any new symptoms. Patient states the pneumonia was due to reflux.  Patient states she is controlling her reflux now by using a wedge pill at night to elevate her head, not eating spicy/ greasy foods that could cause reflux symptoms, and not bending down as much which can cause her reflux issues.  Patient states she chose not to take reflux medication.  Patient states the doctor in the hospital discontinued her blood pressure medication and her heart medication. She states she has been monitoring her blood pressure since she has been discharged from the hospital and it has good. Patient states she will discuss her medications at her doctors appointment on 08/16/18.   Patient states the CAT scan she had in the hospital showed a atypical area on her vertebra. She states she has notified her oncologist of this and she is scheduled to have a bone scan on 08/16/18.   RNCM discussed signs/ symptoms of pneumonia. Advised to call her doctor for symptoms.  Patient reports receiving then [facility] care  management brochure and magnet.  Patient denies any needs at this time.  RNCM advised patient to notify MD of any changes in condition prior to scheduled appointment. RNCM verified patient aware of 911 services for urgent/ emergent needs.   PLAN: RNCM will close due to patient being assessed and having no further needs.  RNCm will send patients primary MD closure notification  Quinn Plowman RN,BSN,CCM [facility] Telephonic  [phone]

## 2018-08-16 ENCOUNTER — Ambulatory Visit (HOSPITAL_COMMUNITY)
Admission: RE | Admit: 2018-08-16 | Discharge: 2018-08-16 | Disposition: A | Discharge status: Home / Self Care | Payer: Medicare Other | Source: Ambulatory Visit | Attending: Hematology and Oncology | Admitting: Hematology and Oncology

## 2018-08-16 DIAGNOSIS — E039 Hypothyroidism, unspecified: Secondary | ICD-10-CM | POA: Diagnosis not present

## 2018-08-16 DIAGNOSIS — C50911 Malignant neoplasm of unspecified site of right female breast: Secondary | ICD-10-CM | POA: Diagnosis not present

## 2018-08-16 DIAGNOSIS — R197 Diarrhea, unspecified: Secondary | ICD-10-CM | POA: Diagnosis not present

## 2018-08-16 DIAGNOSIS — J189 Pneumonia, unspecified organism: Secondary | ICD-10-CM | POA: Diagnosis not present

## 2018-08-16 DIAGNOSIS — C50011 Malignant neoplasm of nipple and areola, right female breast: Secondary | ICD-10-CM | POA: Insufficient documentation

## 2018-08-16 DIAGNOSIS — E875 Hyperkalemia: Secondary | ICD-10-CM | POA: Diagnosis not present

## 2018-08-16 DIAGNOSIS — R030 Elevated blood-pressure reading, without diagnosis of hypertension: Secondary | ICD-10-CM | POA: Diagnosis not present

## 2018-08-16 DIAGNOSIS — M899 Disorder of bone, unspecified: Secondary | ICD-10-CM | POA: Diagnosis not present

## 2018-08-16 DIAGNOSIS — N179 Acute kidney failure, unspecified: Secondary | ICD-10-CM | POA: Diagnosis not present

## 2018-08-16 DIAGNOSIS — J9601 Acute respiratory failure with hypoxia: Secondary | ICD-10-CM | POA: Diagnosis not present

## 2018-08-16 DIAGNOSIS — I110 Hypertensive heart disease with heart failure: Secondary | ICD-10-CM | POA: Diagnosis not present

## 2018-08-16 DIAGNOSIS — I5033 Acute on chronic diastolic (congestive) heart failure: Secondary | ICD-10-CM | POA: Diagnosis not present

## 2018-08-16 DIAGNOSIS — R0602 Shortness of breath: Secondary | ICD-10-CM | POA: Diagnosis not present

## 2018-08-16 DIAGNOSIS — J690 Pneumonitis due to inhalation of food and vomit: Secondary | ICD-10-CM | POA: Diagnosis not present

## 2018-08-16 MED ORDER — TECHNETIUM TC 99M MEDRONATE IV KIT
21.2000 | PACK | Freq: Once | INTRAVENOUS | Status: AC | PRN
Start: 2018-08-16 — End: 2018-08-16
  Administered 2018-08-16: 21.2 via INTRAVENOUS

## 2018-08-17 ENCOUNTER — Other Ambulatory Visit: Payer: Self-pay

## 2018-08-17 ENCOUNTER — Ambulatory Visit (INDEPENDENT_AMBULATORY_CARE_PROVIDER_SITE_OTHER): Payer: Medicare Other | Admitting: Nurse Practitioner

## 2018-08-17 ENCOUNTER — Encounter: Payer: Self-pay | Admitting: Cardiology

## 2018-08-17 VITALS — BP 154/90 | HR 83 | Ht 61.0 in | Wt 138.4 lb

## 2018-08-17 DIAGNOSIS — I10 Essential (primary) hypertension: Secondary | ICD-10-CM | POA: Diagnosis not present

## 2018-08-17 DIAGNOSIS — I480 Paroxysmal atrial fibrillation: Secondary | ICD-10-CM | POA: Diagnosis not present

## 2018-08-17 DIAGNOSIS — I493 Ventricular premature depolarization: Secondary | ICD-10-CM

## 2018-08-17 DIAGNOSIS — I6523 Occlusion and stenosis of bilateral carotid arteries: Secondary | ICD-10-CM

## 2018-08-17 NOTE — Progress Notes (Signed)
Electrophysiology Office Note Date: 08/17/2018  ID:  Patricia, [name] 1946/06/25, MRN [id number]  PCP: [name], MD Primary Cardiologist: Johnsie Cancel Electrophysiologist: Curt Bears  CC: hospital follow up  [name] is a 72 y.o. female seen today for Dr Curt Bears.  She presents today for post hospital followup after being admitted with pneumonia.  She has completed antibiotics. Her QTc was prolonged by review of Dr Curt Bears during admission and her Tikosyn was discontinued. Since discharge, she has persistent fatigue.  She also has been struggling with diarrhea this summer. She has not had recurrent AF. She occasionally feels her PVC's.   She denies PND, orthopnea, nausea, vomiting, dizziness, syncope, edema, weight gain, or early satiety.  Past Medical History:  Diagnosis Date  . Anemia   . Arthritis    fingers   . Blood transfusion    1968  . Breast cancer ([place]) 2009   T1N0M0 DCIS right breast  . Breast cancer ([place]) 2013   a. reocurrence right breast;  b. s/p Taxotere, Cytoxan and XRT  . Carotid stenosis    a. dopplers 9/51:  RICA 8-84%, LICA 16-60% => repeat due in 01/2013;  b.  Carotid US (6/30): RICA 1-60%; LICA 10-93% - f/u 6 mos  . Fracture of left patella 12/2016  . History of radiation therapy 07/10/12-08/13/12   right breast/chest wall  . Hodgkin's disease    stage 2b;  s/p XRT 1988  . HTN (hypertension)   . Hypothyroidism    2/2 XRT for Hodgkins Lymphoma  . LBBB (left bundle branch block)   . Mucositis 04/29/2012  . NICM (nonischemic cardiomyopathy) ([place])    a. EF as low as 45%;  b. LHC 5/05:  LM 10%, pLAD 30%, EF 45-50%,   c. Echo 5/13:  EF 60-65%, Gr 1 diast dysfn, MAC;   d.  Echo 11/13:  EF 60-65%, Gr 1 diast dysfn, Tr AI, mild MR;  d.  Echo 05/10/13: EF 23-55%, grade 1 diastolic dysfunction, PASP 36, small effusion.  Marland Kitchen PAF (paroxysmal atrial fibrillation) ([place])    a. dx in 2008;  b. CHADS2-VASc=5;  c. changed to [place] during admx 05/2013; d. Xarelto  started 05/2013; e. s/p successful Afib ablation 06/30/2017  . PEA (Pulseless electrical activity) ([place])    a. admx 05/2013 for APE in setting of HTN emergency c/b PEA arrest and asp pneumonia, VDRF, AFib with RVR  . Pneumonia 08/02/2018  . Raynaud's disease   . Status post radiation therapy 1988   mantle and periaortic    Past Surgical History:  Procedure Laterality Date  . ATRIAL FIBRILLATION ABLATION  06/30/2017  . ATRIAL FIBRILLATION ABLATION N/A 06/30/2017   Procedure: Atrial Fibrillation Ablation;  Surgeon: Constance Haw, MD;  Location: [place] [facility];  Service: Cardiovascular;  Laterality: N/A;  . BREAST LUMPECTOMY  2009   right, lymph node biopsy  . BTL  1971  . DILATION AND CURETTAGE OF UTERUS    . IR RADIOLOGY PERIPHERAL GUIDED IV START  06/23/2017  . IR US GUIDE VASC ACCESS LEFT  06/23/2017  . MASS EXCISION  03/28/2012   Procedure: EXCISION MASS;  Surgeon: Edward Jolly, MD;  Location: [place];  Service: General;  Laterality: Right;  Excision right chest wall mass  . MASTECTOMY  2009   right breast  . POLYPECTOMY  1990   D&C (also in 1977 and [place])   . PORT-A-CATH REMOVAL  10/03/2012   Procedure: REMOVAL PORT-A-CATH;  Surgeon: Marland Kitchen  Edward Jolly, MD;  Location: [facility];  Service: General;  Laterality: N/A;  . PORTACATH PLACEMENT  04/16/2012   Procedure: INSERTION PORT-A-CATH;  Surgeon: Edward Jolly, MD;  Location: [facility];  Service: General;  Laterality: Left;  Placement of Port-a-Cath, left subclavian  . R vein ligation & stripping  1976  . Spleenectomy  1988   for Hodgkin's disease  . Thoractomy  1968  . TISSUE EXPANDER  REMOVAL W/ REPLACEMENT OF IMPLANT    . TISSUE EXPANDER PLACEMENT     right breast  . TONSILLECTOMY AND ADENOIDECTOMY  1965    Current Outpatient Medications  Medication Sig Dispense Refill  . acetaminophen (TYLENOL) 325 MG tablet Take 650 mg by mouth at bedtime as needed for moderate pain.    Marland Kitchen albuterol (PROVENTIL  HFA;VENTOLIN HFA) 108 (90 Base) MCG/ACT inhaler Inhale 2 puffs into the lungs every 6 (six) hours as needed for wheezing or shortness of breath. 1 Inhaler 2  . atorvastatin (LIPITOR) 10 MG tablet Take 1 tablet (10 mg total) by mouth daily at 6 PM. 90 tablet 3  . b complex vitamins capsule Take 1 capsule by mouth daily after breakfast.    . calcium carbonate (OS-CAL) 600 MG TABS Take 600 mg by mouth at bedtime.     . carvedilol (COREG) 12.5 MG tablet Take 1 tablet (12.5 mg total) by mouth 2 (two) times daily with a meal. 30 tablet 0  . Cholecalciferol (VITAMIN D) 2000 UNITS CAPS Take 2,000 Units by mouth daily with lunch.     . guaiFENesin (MUCINEX) 600 MG 12 hr tablet Take 1 tablet (600 mg total) by mouth 2 (two) times daily as needed for to loosen phlegm. 30 tablet 0  . levothyroxine (SYNTHROID, LEVOTHROID) 100 MCG tablet Take 100 mcg by mouth daily at 6 (six) AM.     . magnesium oxide (MAG-OX 400) 400 MG tablet Take 400 mg by mouth 2 (two) times daily.     . Multiple Vitamin (MULTIVITAMIN) tablet Take 1 tablet by mouth daily after breakfast. "Natural Vitamin"    . OVER THE COUNTER MEDICATION BDZ tablets: Take 1 tablet by mouth at bedtime    . OVER THE COUNTER MEDICATION Vegetarian Omega Green: Take 3 capsules by mouth daily with lunch    . saccharomyces boulardii (FLORASTOR) 250 MG capsule Take 1 capsule (250 mg total) by mouth 2 (two) times daily. 30 capsule 0  . XARELTO 20 MG TABS tablet TAKE 1 TABLET BY MOUTH DAILY WITH DINNER (Patient taking differently: Take 20 mg by mouth daily with supper. ) 90 tablet 3   No current facility-administered medications for this visit.     Allergies:   Ace inhibitors; Benazepril; Epinephrine; Iodinated diagnostic agents; Potassium-containing compounds; Prednisolone; and Taxotere [docetaxel]   Social History: Social History   Socioeconomic History  . Marital status: Widowed    Spouse name: Donnie  . Number of children: Not on file  . Years of  education: Not on file  . Highest education level: Not on file  Occupational History  . Occupation: Holiday representative: [employer]  Social Needs  . Financial resource strain: Not on file  . Food insecurity:    Worry: Not on file    Inability: Not on file  . Transportation needs:    Medical: Not on file    Non-medical: Not on file  Tobacco Use  . Smoking status: Never Smoker  . Smokeless tobacco: Never Used  Substance and Sexual  Activity  . Alcohol use: No  . Drug use: No  . Sexual activity: Not Currently    Birth control/protection: None    Comment: post menopausal, HRT x 8 yrs  Lifestyle  . Physical activity:    Days per week: Not on file    Minutes per session: Not on file  . Stress: Not on file  Relationships  . Social connections:    Talks on phone: Not on file    Gets together: Not on file    Attends religious service: Not on file    Active member of club or organization: Not on file    Attends meetings of clubs or organizations: Not on file    Relationship status: Not on file  . Intimate partner violence:    Fear of current or ex partner: Not on file    Emotionally abused: Not on file    Physically abused: Not on file    Forced sexual activity: Not on file  Other Topics Concern  . Not on file  Social History Narrative   Regular exercise. Retired and married.     Family History: Family History  Problem Relation Age of Onset  . Hypertension Mother   . Cancer Neg Hx     Review of Systems: All other systems reviewed and are otherwise negative except as noted above.   Physical Exam: VS:  LMP 03/08/1995  , BMI There is no height or weight on file to calculate BMI. Wt Readings from Last 3 Encounters:  08/03/18 134 lb 3.2 oz (60.9 kg)  06/19/18 134 lb 6.4 oz (61 kg)  06/16/18 135 lb 9.6 oz (61.5 kg)    GEN- The patient is well appearing, alert and oriented x 3 today.   HEENT: normocephalic, atraumatic; sclera clear, conjunctiva pink; hearing  intact; oropharynx clear; neck supple  Lungs- Clear to ausculation bilaterally, normal work of breathing.  No wheezes, rales, rhonchi Heart- Irregular rate and rhythm  GI- soft, non-tender, non-distended, bowel sounds present  Extremities- no clubbing, cyanosis, or edema  MS- no significant deformity or atrophy Skin- warm and dry, no rash or lesion  Psych- euthymic mood, full affect Neuro- strength and sensation are intact   EKG:  EKG is ordered today. The ekg ordered today shows sinus rhythm with bigeminal PVC's, QTc stable  Recent Labs: 08/02/2018: B Natriuretic Peptide 443.6; Magnesium 2.0 08/03/2018: ALT 22; BUN 18; Creatinine, Ser 0.87; Hemoglobin 13.3; Platelets 358; Potassium 4.1; Sodium 141    Other studies Reviewed: Additional studies/ records that were reviewed today include: hospital records   Assessment and Plan:  1.  Paroxysmal atrial fibrillation Maintaining SR off AAD therapy Continue Xarelto  2.  PVCs Unclear how symptomatic these are but she is in bigeminy today After she recovers completely from pneumonia, if still fatigued, would consider 24 hour holter monitor to quantify burden. She may need ablation or alternate AAD therapy  3.  HTN BP elevated in clinic but consistently <403 systolic at home  4.  Diarrhea Will stop Magnesium and see if that helps    Current medicines are reviewed at length with the patient today.   The patient does not have concerns regarding her medicines.  The following changes were made today:  none  Labs/ tests ordered today include: none No orders of the defined types were placed in this encounter.    Disposition:   Follow up with Dr Curt Bears as scheduled     Signed, Chanetta Marshall, NP 08/17/2018 9:42 AM  [address] [place] [address] [phone] (office) [phone] (fax)

## 2018-08-17 NOTE — Patient Instructions (Addendum)
Medication Instructions:    STOP TAKING MAGNESIUM    If you need a refill on your cardiac medications before your next appointment, please call your pharmacy.  Labwork: NONE ORDERED  TODAY    Testing/Procedures: NONE ORDERED  TODAY    Follow-Up: .WITH CAMNITZA SCHEDULED    Any Other Special Instructions Will Be Listed Below (If Applicable).

## 2018-08-17 NOTE — Patient Outreach (Signed)
[place] [facility]) Care Management  08/17/2018  [name] 1946/10/17 [id number]  EMMI: pneumonia red alert Referral date: 08/17/18 Referral reason: feeling better overall: no Insurance: Medicare Day # 9  Telephone call to patient regarding EMMI pneumonia red alert. HIPAA verified. Patient states she is currently driving and will not be able to continue call.  Patient reports she saw her general practitioner on yesterday and had her questions/ concerns addressed. She states she had a good follow up and she is doing fine.   PLAN: RNCM will follow up with patient within 4 business days.   Quinn Plowman RN,BSN,CCM [facility] Telephonic  [phone]

## 2018-08-18 DIAGNOSIS — R0902 Hypoxemia: Secondary | ICD-10-CM | POA: Diagnosis not present

## 2018-08-18 DIAGNOSIS — J80 Acute respiratory distress syndrome: Secondary | ICD-10-CM | POA: Diagnosis not present

## 2018-08-18 DIAGNOSIS — R404 Transient alteration of awareness: Secondary | ICD-10-CM | POA: Diagnosis not present

## 2018-08-18 DIAGNOSIS — R0689 Other abnormalities of breathing: Secondary | ICD-10-CM | POA: Diagnosis not present

## 2018-08-18 DIAGNOSIS — I4891 Unspecified atrial fibrillation: Secondary | ICD-10-CM | POA: Diagnosis not present

## 2018-08-19 ENCOUNTER — Other Ambulatory Visit: Payer: Self-pay

## 2018-08-19 ENCOUNTER — Encounter (HOSPITAL_COMMUNITY): Payer: Self-pay | Admitting: Emergency Medicine

## 2018-08-19 ENCOUNTER — Emergency Department (HOSPITAL_COMMUNITY): Payer: Medicare Other

## 2018-08-19 ENCOUNTER — Inpatient Hospital Stay (HOSPITAL_COMMUNITY)
Admission: EM | Admit: 2018-08-19 | Discharge: 2018-08-24 | DRG: 193 | Disposition: A | Discharge status: Home Health | Payer: Medicare Other | Attending: Internal Medicine | Admitting: Internal Medicine

## 2018-08-19 DIAGNOSIS — Z8249 Family history of ischemic heart disease and other diseases of the circulatory system: Secondary | ICD-10-CM | POA: Diagnosis not present

## 2018-08-19 DIAGNOSIS — Z923 Personal history of irradiation: Secondary | ICD-10-CM | POA: Diagnosis not present

## 2018-08-19 DIAGNOSIS — N179 Acute kidney failure, unspecified: Secondary | ICD-10-CM | POA: Diagnosis present

## 2018-08-19 DIAGNOSIS — R739 Hyperglycemia, unspecified: Secondary | ICD-10-CM | POA: Diagnosis not present

## 2018-08-19 DIAGNOSIS — I739 Peripheral vascular disease, unspecified: Secondary | ICD-10-CM

## 2018-08-19 DIAGNOSIS — Z7901 Long term (current) use of anticoagulants: Secondary | ICD-10-CM | POA: Diagnosis not present

## 2018-08-19 DIAGNOSIS — Z853 Personal history of malignant neoplasm of breast: Secondary | ICD-10-CM | POA: Diagnosis not present

## 2018-08-19 DIAGNOSIS — Z9221 Personal history of antineoplastic chemotherapy: Secondary | ICD-10-CM

## 2018-08-19 DIAGNOSIS — Z9011 Acquired absence of right breast and nipple: Secondary | ICD-10-CM | POA: Diagnosis not present

## 2018-08-19 DIAGNOSIS — R0602 Shortness of breath: Secondary | ICD-10-CM | POA: Diagnosis not present

## 2018-08-19 DIAGNOSIS — I5033 Acute on chronic diastolic (congestive) heart failure: Secondary | ICD-10-CM | POA: Diagnosis present

## 2018-08-19 DIAGNOSIS — E785 Hyperlipidemia, unspecified: Secondary | ICD-10-CM | POA: Diagnosis present

## 2018-08-19 DIAGNOSIS — Z86010 Personal history of colonic polyps: Secondary | ICD-10-CM

## 2018-08-19 DIAGNOSIS — I5031 Acute diastolic (congestive) heart failure: Secondary | ICD-10-CM | POA: Diagnosis not present

## 2018-08-19 DIAGNOSIS — Z888 Allergy status to other drugs, medicaments and biological substances status: Secondary | ICD-10-CM | POA: Diagnosis not present

## 2018-08-19 DIAGNOSIS — J189 Pneumonia, unspecified organism: Secondary | ICD-10-CM | POA: Diagnosis present

## 2018-08-19 DIAGNOSIS — N289 Disorder of kidney and ureter, unspecified: Secondary | ICD-10-CM | POA: Diagnosis not present

## 2018-08-19 DIAGNOSIS — Z8572 Personal history of non-Hodgkin lymphomas: Secondary | ICD-10-CM | POA: Diagnosis not present

## 2018-08-19 DIAGNOSIS — Y95 Nosocomial condition: Secondary | ICD-10-CM | POA: Diagnosis present

## 2018-08-19 DIAGNOSIS — I779 Disorder of arteries and arterioles, unspecified: Secondary | ICD-10-CM | POA: Diagnosis not present

## 2018-08-19 DIAGNOSIS — I110 Hypertensive heart disease with heart failure: Secondary | ICD-10-CM | POA: Diagnosis present

## 2018-08-19 DIAGNOSIS — I480 Paroxysmal atrial fibrillation: Secondary | ICD-10-CM | POA: Diagnosis present

## 2018-08-19 DIAGNOSIS — T380X5A Adverse effect of glucocorticoids and synthetic analogues, initial encounter: Secondary | ICD-10-CM | POA: Diagnosis not present

## 2018-08-19 DIAGNOSIS — Z79890 Hormone replacement therapy (postmenopausal): Secondary | ICD-10-CM

## 2018-08-19 DIAGNOSIS — I447 Left bundle-branch block, unspecified: Secondary | ICD-10-CM | POA: Diagnosis present

## 2018-08-19 DIAGNOSIS — R05 Cough: Secondary | ICD-10-CM | POA: Diagnosis not present

## 2018-08-19 DIAGNOSIS — E039 Hypothyroidism, unspecified: Secondary | ICD-10-CM | POA: Diagnosis present

## 2018-08-19 DIAGNOSIS — Z79899 Other long term (current) drug therapy: Secondary | ICD-10-CM | POA: Diagnosis not present

## 2018-08-19 DIAGNOSIS — I5032 Chronic diastolic (congestive) heart failure: Secondary | ICD-10-CM

## 2018-08-19 DIAGNOSIS — Z91041 Radiographic dye allergy status: Secondary | ICD-10-CM | POA: Diagnosis not present

## 2018-08-19 DIAGNOSIS — J9601 Acute respiratory failure with hypoxia: Secondary | ICD-10-CM | POA: Diagnosis present

## 2018-08-19 DIAGNOSIS — E875 Hyperkalemia: Secondary | ICD-10-CM | POA: Diagnosis present

## 2018-08-19 DIAGNOSIS — R0902 Hypoxemia: Secondary | ICD-10-CM

## 2018-08-19 LAB — URINALYSIS, ROUTINE W REFLEX MICROSCOPIC
Bilirubin Urine: NEGATIVE
Glucose, UA: 50 mg/dL — AB
Hgb urine dipstick: NEGATIVE
Ketones, ur: NEGATIVE mg/dL
Leukocytes, UA: NEGATIVE
Nitrite: NEGATIVE
PROTEIN: 100 mg/dL — AB
SPECIFIC GRAVITY, URINE: 1.015 (ref 1.005–1.030)
pH: 6 (ref 5.0–8.0)

## 2018-08-19 LAB — CBC WITH DIFFERENTIAL/PLATELET
Abs Immature Granulocytes: 0.1 10*3/uL (ref 0.0–0.1)
BASOS ABS: 0.1 10*3/uL (ref 0.0–0.1)
BASOS PCT: 1 %
EOS ABS: 0.2 10*3/uL (ref 0.0–0.7)
EOS PCT: 1 %
HCT: 45.2 % (ref 36.0–46.0)
Hemoglobin: 14 g/dL (ref 12.0–15.0)
IMMATURE GRANULOCYTES: 1 %
Lymphocytes Relative: 8 %
Lymphs Abs: 1.1 10*3/uL (ref 0.7–4.0)
MCH: 29.8 pg (ref 26.0–34.0)
MCHC: 31 g/dL (ref 30.0–36.0)
MCV: 96.2 fL (ref 78.0–100.0)
Monocytes Absolute: 0.5 10*3/uL (ref 0.1–1.0)
Monocytes Relative: 4 %
NEUTROS PCT: 85 %
Neutro Abs: 12.2 10*3/uL — ABNORMAL HIGH (ref 1.7–7.7)
PLATELETS: 332 10*3/uL (ref 150–400)
RBC: 4.7 MIL/uL (ref 3.87–5.11)
RDW: 14.7 % (ref 11.5–15.5)
WBC: 14.2 10*3/uL — AB (ref 4.0–10.5)

## 2018-08-19 LAB — GLUCOSE, CAPILLARY
GLUCOSE-CAPILLARY: 166 mg/dL — AB (ref 70–99)
GLUCOSE-CAPILLARY: 110 mg/dL — AB (ref 70–99)
GLUCOSE-CAPILLARY: 110 mg/dL — AB (ref 70–99)
Glucose-Capillary: 79 mg/dL (ref 70–99)
Glucose-Capillary: 90 mg/dL (ref 70–99)
Glucose-Capillary: 85 mg/dL (ref 70–99)

## 2018-08-19 LAB — I-STAT ARTERIAL BLOOD GAS, ED
ACID-BASE DEFICIT: 6 mmol/L — AB (ref 0.0–2.0)
BICARBONATE: 21.1 mmol/L (ref 20.0–28.0)
O2 SAT: 91 %
PCO2 ART: 46.9 mmHg (ref 32.0–48.0)
PO2 ART: 72 mmHg — AB (ref 83.0–108.0)
Patient temperature: 98.8
TCO2: 22 mmol/L (ref 22–32)
pH, Arterial: 7.26 — ABNORMAL LOW (ref 7.350–7.450)

## 2018-08-19 LAB — COMPREHENSIVE METABOLIC PANEL
ALBUMIN: 3.7 g/dL (ref 3.5–5.0)
ALT: 38 U/L (ref 0–44)
ANION GAP: 11 (ref 5–15)
AST: 45 U/L — AB (ref 15–41)
Alkaline Phosphatase: 122 U/L (ref 38–126)
BUN: 25 mg/dL — ABNORMAL HIGH (ref 8–23)
CHLORIDE: 105 mmol/L (ref 98–111)
CO2: 23 mmol/L (ref 22–32)
Calcium: 8.8 mg/dL — ABNORMAL LOW (ref 8.9–10.3)
Creatinine, Ser: 1.15 mg/dL — ABNORMAL HIGH (ref 0.44–1.00)
GFR calc Af Amer: 54 mL/min — ABNORMAL LOW (ref >60)
GFR calc non Af Amer: 46 mL/min — ABNORMAL LOW (ref >60)
GLUCOSE: 330 mg/dL — AB (ref 70–99)
POTASSIUM: 4.9 mmol/L (ref 3.5–5.1)
SODIUM: 139 mmol/L (ref 135–145)
TOTAL PROTEIN: 6.7 g/dL (ref 6.5–8.1)
Total Bilirubin: 0.6 mg/dL (ref 0.3–1.2)

## 2018-08-19 LAB — STREP PNEUMONIAE URINARY ANTIGEN: STREP PNEUMO URINARY ANTIGEN: NEGATIVE

## 2018-08-19 LAB — MRSA PCR SCREENING: MRSA by PCR: NEGATIVE

## 2018-08-19 LAB — LACTIC ACID, PLASMA: LACTIC ACID, VENOUS: 2.9 mmol/L — AB (ref 0.5–1.9)

## 2018-08-19 LAB — BRAIN NATRIURETIC PEPTIDE: B NATRIURETIC PEPTIDE 5: 374.8 pg/mL — AB (ref 0.0–100.0)

## 2018-08-19 LAB — BASIC METABOLIC PANEL
Anion gap: 8 (ref 5–15)
BUN: 24 mg/dL — AB (ref 8–23)
CALCIUM: 8.6 mg/dL — AB (ref 8.9–10.3)
CO2: 24 mmol/L (ref 22–32)
Chloride: 109 mmol/L (ref 98–111)
Creatinine, Ser: 0.88 mg/dL (ref 0.44–1.00)
GFR calc Af Amer: >60 mL/min (ref >60)
GFR calc non Af Amer: >60 mL/min (ref >60)
Glucose, Bld: 144 mg/dL — ABNORMAL HIGH (ref 70–99)
POTASSIUM: 5.6 mmol/L — AB (ref 3.5–5.1)
SODIUM: 141 mmol/L (ref 135–145)

## 2018-08-19 LAB — PROCALCITONIN: PROCALCITONIN: 0.22 ng/mL

## 2018-08-19 LAB — I-STAT CG4 LACTIC ACID, ED: LACTIC ACID, VENOUS: 3.29 mmol/L — AB (ref 0.5–1.9)

## 2018-08-19 LAB — I-STAT TROPONIN, ED: Troponin i, poc: 0.04 ng/mL (ref 0.00–0.08)

## 2018-08-19 LAB — CBG MONITORING, ED: Glucose-Capillary: 294 mg/dL — ABNORMAL HIGH (ref 70–99)

## 2018-08-19 MED ORDER — LABETALOL HCL 5 MG/ML IV SOLN
10.0000 mg | Freq: Once | INTRAVENOUS | Status: AC
  Administered 2018-08-19: 10 mg via INTRAVENOUS
  Filled 2018-08-19: qty 4

## 2018-08-19 MED ORDER — ACETAMINOPHEN 650 MG RE SUPP: 650.0000 mg | Freq: Four times a day (QID) | RECTAL | Status: DC | PRN

## 2018-08-19 MED ORDER — INSULIN ASPART 100 UNIT/ML ~~LOC~~ SOLN
0.0000 [IU] | SUBCUTANEOUS | Status: DC
  Administered 2018-08-19: 2 [IU] via SUBCUTANEOUS
  Administered 2018-08-20 – 2018-08-21 (×3): 1 [IU] via SUBCUTANEOUS
  Administered 2018-08-21: 2 [IU] via SUBCUTANEOUS
  Administered 2018-08-22 (×2): 1 [IU] via SUBCUTANEOUS

## 2018-08-19 MED ORDER — SODIUM CHLORIDE 0.9% FLUSH
3.0000 mL | Freq: Two times a day (BID) | INTRAVENOUS | Status: DC
  Administered 2018-08-19 – 2018-08-23 (×7): 3 mL via INTRAVENOUS

## 2018-08-19 MED ORDER — VANCOMYCIN HCL IN DEXTROSE 1-5 GM/200ML-% IV SOLN
1000.0000 mg | Freq: Once | INTRAVENOUS | Status: AC
  Administered 2018-08-19: 1000 mg via INTRAVENOUS
  Filled 2018-08-19: qty 200

## 2018-08-19 MED ORDER — ADULT MULTIVITAMIN W/MINERALS CH
1.0000 | ORAL_TABLET | Freq: Every day | ORAL | Status: DC
  Administered 2018-08-20 – 2018-08-24 (×4): 1 via ORAL
  Filled 2018-08-19 (×4): qty 1

## 2018-08-19 MED ORDER — SENNOSIDES-DOCUSATE SODIUM 8.6-50 MG PO TABS
1.0000 | ORAL_TABLET | Freq: Every evening | ORAL | Status: DC | PRN
  Administered 2018-08-22 – 2018-08-23 (×2): 1 via ORAL
  Filled 2018-08-19 (×2): qty 1

## 2018-08-19 MED ORDER — SODIUM CHLORIDE 0.9 % IV SOLN
1.0000 g | Freq: Two times a day (BID) | INTRAVENOUS | Status: AC
  Administered 2018-08-19 – 2018-08-22 (×7): 1 g via INTRAVENOUS
  Filled 2018-08-19 (×8): qty 1

## 2018-08-19 MED ORDER — CARVEDILOL 12.5 MG PO TABS
12.5000 mg | ORAL_TABLET | Freq: Two times a day (BID) | ORAL | Status: DC
  Administered 2018-08-19 – 2018-08-24 (×11): 12.5 mg via ORAL
  Filled 2018-08-19 (×12): qty 1

## 2018-08-19 MED ORDER — CALCIUM CARBONATE 1250 (500 CA) MG PO TABS
1250.0000 mg | ORAL_TABLET | Freq: Every day | ORAL | Status: DC
  Administered 2018-08-23: 1250 mg via ORAL
  Filled 2018-08-19 (×2): qty 1

## 2018-08-19 MED ORDER — NITROGLYCERIN 0.4 MG SL SUBL: 0.4000 mg | SUBLINGUAL_TABLET | SUBLINGUAL | Status: DC | PRN

## 2018-08-19 MED ORDER — SODIUM POLYSTYRENE SULFONATE PO POWD
15.0000 g | Freq: Once | ORAL | Status: AC
  Administered 2018-08-19: 15 g via ORAL
  Filled 2018-08-19: qty 15

## 2018-08-19 MED ORDER — ONDANSETRON HCL 4 MG/2ML IJ SOLN
4.0000 mg | Freq: Four times a day (QID) | INTRAMUSCULAR | Status: DC | PRN
  Administered 2018-08-19 (×2): 4 mg via INTRAVENOUS
  Filled 2018-08-19 (×3): qty 2

## 2018-08-19 MED ORDER — B COMPLEX-C PO TABS
1.0000 | ORAL_TABLET | Freq: Every day | ORAL | Status: DC
  Administered 2018-08-20 – 2018-08-24 (×4): 1 via ORAL
  Filled 2018-08-19 (×6): qty 1

## 2018-08-19 MED ORDER — VITAMIN D 1000 UNITS PO TABS
2000.0000 [IU] | ORAL_TABLET | Freq: Every day | ORAL | Status: DC
  Administered 2018-08-20 – 2018-08-24 (×5): 2000 [IU] via ORAL
  Filled 2018-08-19 (×5): qty 2

## 2018-08-19 MED ORDER — LORAZEPAM 2 MG/ML IJ SOLN
1.0000 mg | Freq: Once | INTRAMUSCULAR | Status: AC
  Administered 2018-08-19: 1 mg via INTRAVENOUS
  Filled 2018-08-19: qty 1

## 2018-08-19 MED ORDER — SODIUM CHLORIDE 0.9 % IV SOLN
2.0000 g | Freq: Once | INTRAVENOUS | Status: AC
  Administered 2018-08-19: 2 g via INTRAVENOUS
  Filled 2018-08-19: qty 2

## 2018-08-19 MED ORDER — DEXTROSE 5 % IV SOLN
INTRAVENOUS | Status: DC
  Administered 2018-08-19: 14:00:00 via INTRAVENOUS

## 2018-08-19 MED ORDER — HYDROCODONE-ACETAMINOPHEN 5-325 MG PO TABS: 1.0000 | ORAL_TABLET | ORAL | Status: DC | PRN

## 2018-08-19 MED ORDER — ATORVASTATIN CALCIUM 10 MG PO TABS
10.0000 mg | ORAL_TABLET | Freq: Every day | ORAL | Status: DC
  Administered 2018-08-20 – 2018-08-24 (×5): 10 mg via ORAL
  Filled 2018-08-19 (×5): qty 1

## 2018-08-19 MED ORDER — SODIUM CHLORIDE 0.9 % IV SOLN
250.0000 mL | INTRAVENOUS | Status: DC | PRN
  Administered 2018-08-22: 10 mL via INTRAVENOUS

## 2018-08-19 MED ORDER — VANCOMYCIN HCL IN DEXTROSE 1-5 GM/200ML-% IV SOLN
1000.0000 mg | INTRAVENOUS | Status: DC
  Administered 2018-08-19: 1000 mg via INTRAVENOUS
  Filled 2018-08-19 (×2): qty 200

## 2018-08-19 MED ORDER — SODIUM CHLORIDE 0.9% FLUSH
3.0000 mL | Freq: Two times a day (BID) | INTRAVENOUS | Status: DC
  Administered 2018-08-20 – 2018-08-24 (×6): 3 mL via INTRAVENOUS

## 2018-08-19 MED ORDER — HYDRALAZINE HCL 20 MG/ML IJ SOLN
10.0000 mg | Freq: Four times a day (QID) | INTRAMUSCULAR | Status: DC | PRN
  Administered 2018-08-19 – 2018-08-20 (×4): 10 mg via INTRAVENOUS
  Filled 2018-08-19 (×4): qty 1

## 2018-08-19 MED ORDER — ACETAMINOPHEN 325 MG PO TABS: 650.0000 mg | ORAL_TABLET | Freq: Four times a day (QID) | ORAL | Status: DC | PRN

## 2018-08-19 MED ORDER — ONDANSETRON HCL 4 MG PO TABS
4.0000 mg | ORAL_TABLET | Freq: Four times a day (QID) | ORAL | Status: DC | PRN
  Administered 2018-08-20 – 2018-08-21 (×3): 4 mg via ORAL
  Filled 2018-08-19 (×3): qty 1

## 2018-08-19 MED ORDER — LEVOTHYROXINE SODIUM 100 MCG PO TABS
100.0000 ug | ORAL_TABLET | Freq: Every day | ORAL | Status: DC
  Administered 2018-08-19 – 2018-08-24 (×6): 100 ug via ORAL
  Filled 2018-08-19 (×6): qty 1

## 2018-08-19 MED ORDER — RIVAROXABAN 20 MG PO TABS
20.0000 mg | ORAL_TABLET | Freq: Every day | ORAL | Status: DC
  Administered 2018-08-20 – 2018-08-24 (×5): 20 mg via ORAL
  Filled 2018-08-19 (×5): qty 1

## 2018-08-19 MED ORDER — SODIUM CHLORIDE 0.9% FLUSH
3.0000 mL | INTRAVENOUS | Status: DC | PRN
  Administered 2018-08-23: 3 mL via INTRAVENOUS
  Filled 2018-08-19: qty 3

## 2018-08-19 MED ORDER — SODIUM CHLORIDE 0.9 % IV BOLUS
1000.0000 mL | Freq: Once | INTRAVENOUS | Status: AC
  Administered 2018-08-19: 1000 mL via INTRAVENOUS

## 2018-08-19 NOTE — H&P (Signed)
History and Physical    [name] DVV:[id number] DOB: [birth year] DOA: 08/19/2018  PCP: [name], MD   Patient coming from: Home   Chief Complaint: SOB, cough, fatigue   HPI: [name] is a 72 y.o. female with medical history significant for paroxysmal atrial fibrillation on Xarelto, chronic diastolic CHF, hypothyroidism, and recent admission for pneumonia, now presenting to the emergency department with shortness of breath, cough, lethargy, and generalized weakness.  Patient was discharged from the hospital on 08/03/2018, completed a course of Augmentin at home, felt as though her respiratory status had returned to normal, but she was experiencing persistent fatigue and generalized weakness.  Over the past day, cough has returned and she has become progressively dyspneic.  She reports being unable to catch her breath at rest last night, prompting her to call EMS.  She was found to be saturating around 50% on room air and was treated with nebs prior to arrival in the ED.  She denies any chest pain, orthopnea, leg swelling, fevers, or chills.  She reports the cough has been nonproductive.  She denies any difficulty swallowing, choking, or vomiting.  ED Course: Upon arrival to the ED, patient is found to be afebrile, saturating low 90s on BiPAP, tachypneic with a rate of 30, tachycardic in the 110s, and with stable blood pressure.  EKG features a sinus tachycardia with rate 110 and nonspecific IVCD.  Chest x-ray reveals bilateral pleural effusions and bilateral mid to lower lung atelectasis/infiltrate.  Chemistry panel is notable for glucose of 330 and creatinine 1.15, up from 0.87 earlier this month.  CBC is notable for a new leukocytosis to 14,200.  Lactic acid is elevated to 3.29, troponin is normal, and BNP is 375.  Patient was placed on BiPAP, blood cultures were collected, she was given a liter of normal saline, and treated with vancomycin and cefepime.  She has remained  hemodynamically stable, but remains dyspneic at rest, she needs to be on BiPAP, and will be admitted for ongoing evaluation and management.  Review of Systems:  All other systems reviewed and apart from HPI, are negative.  Past Medical History:  Diagnosis Date  . Anemia   . Arthritis    fingers   . Blood transfusion    1968  . Breast cancer ([place]) 2009   T1N0M0 DCIS right breast  . Breast cancer ([place]) 2013   a. reocurrence right breast;  b. s/p Taxotere, Cytoxan and XRT  . Carotid stenosis    a. dopplers 6/26:  RICA 9-48%, LICA 54-62% => repeat due in 01/2013;  b.  Carotid US (7/03): RICA 5-00%; LICA 93-81% - f/u 6 mos  . Fracture of left patella 12/2016  . History of radiation therapy 07/10/12-08/13/12   right breast/chest wall  . Hodgkin's disease    stage 2b;  s/p XRT 1988  . HTN (hypertension)   . Hypothyroidism    2/2 XRT for Hodgkins Lymphoma  . LBBB (left bundle branch block)   . Mucositis 04/29/2012  . NICM (nonischemic cardiomyopathy) ([place])    a. EF as low as 45%;  b. LHC 5/05:  LM 10%, pLAD 30%, EF 45-50%,   c. Echo 5/13:  EF 60-65%, Gr 1 diast dysfn, MAC;   d.  Echo 11/13:  EF 60-65%, Gr 1 diast dysfn, Tr AI, mild MR;  d.  Echo 05/10/13: EF 82-99%, grade 1 diastolic dysfunction, PASP 36, small effusion.  Marland Kitchen PAF (paroxysmal atrial fibrillation) ([facility])    a. dx in  2008;  b. CHADS2-VASc=5;  c. changed to Tikosyn during admx 05/2013; d. Xarelto started 05/2013; e. s/p successful Afib ablation 06/30/2017  . PEA (Pulseless electrical activity) ([place])    a. admx 05/2013 for APE in setting of HTN emergency c/b PEA arrest and asp pneumonia, VDRF, AFib with RVR  . Pneumonia 08/02/2018  . Raynaud's disease   . Status post radiation therapy 1988   mantle and periaortic     Past Surgical History:  Procedure Laterality Date  . ATRIAL FIBRILLATION ABLATION  06/30/2017  . ATRIAL FIBRILLATION ABLATION N/A 06/30/2017   Procedure: Atrial Fibrillation Ablation;  Surgeon: Constance Haw,  MD;  Location: [place] [facility];  Service: Cardiovascular;  Laterality: N/A;  . BREAST LUMPECTOMY  2009   right, lymph node biopsy  . BTL  1971  . DILATION AND CURETTAGE OF UTERUS    . IR RADIOLOGY PERIPHERAL GUIDED IV START  06/23/2017  . IR US GUIDE VASC ACCESS LEFT  06/23/2017  . MASS EXCISION  03/28/2012   Procedure: EXCISION MASS;  Surgeon: Edward Jolly, MD;  Location: [place];  Service: General;  Laterality: Right;  Excision right chest wall mass  . MASTECTOMY  2009   right breast  . POLYPECTOMY  1990   D&C (also in 1977 and [place])   . PORT-A-CATH REMOVAL  10/03/2012   Procedure: REMOVAL PORT-A-CATH;  Surgeon: Edward Jolly, MD;  Location: [facility];  Service: General;  Laterality: N/A;  . PORTACATH PLACEMENT  04/16/2012   Procedure: INSERTION PORT-A-CATH;  Surgeon: Edward Jolly, MD;  Location: [facility];  Service: General;  Laterality: Left;  Placement of Port-a-Cath, left subclavian  . R vein ligation & stripping  1976  . Spleenectomy  1988   for Hodgkin's disease  . Thoractomy  1968  . TISSUE EXPANDER  REMOVAL W/ REPLACEMENT OF IMPLANT    . TISSUE EXPANDER PLACEMENT     right breast  . TONSILLECTOMY AND ADENOIDECTOMY  1965     reports that she has never smoked. She has never used smokeless tobacco. She reports that she does not drink alcohol or use drugs.  Allergies  Allergen Reactions  . Ace Inhibitors Cough  . Benazepril Cough  . Epinephrine Other (See Comments)    Feels "buzzed" and heart rate increases  . Iodinated Diagnostic Agents Other (See Comments)    Sneezing   . Potassium-Containing Compounds Nausea And Vomiting    Projectile vomiting--liquid potassium   . Prednisolone Other (See Comments)    Arrhythmia(s)  . Taxotere [Docetaxel] Other (See Comments)    Fluid overload    Family History  Problem Relation Age of Onset  . Hypertension Mother   . Cancer Neg Hx      Prior to Admission medications   Medication Sig Start  Date End Date Taking? Authorizing Provider  acetaminophen (TYLENOL) 325 MG tablet Take 650 mg by mouth at bedtime as needed for moderate pain.    [provider]  albuterol (PROVENTIL HFA;VENTOLIN HFA) 108 (90 Base) MCG/ACT inhaler Inhale 2 puffs into the lungs every 6 (six) hours as needed for wheezing or shortness of breath. 08/03/18   Regalado, Belkys A, MD  atorvastatin (LIPITOR) 10 MG tablet Take 1 tablet (10 mg total) by mouth daily at 6 PM. 01/04/18   Josue Hector, MD  b complex vitamins capsule Take 1 capsule by mouth daily after breakfast.    [provider]  calcium carbonate (OS-CAL) 600 MG TABS Take 600  mg by mouth at bedtime.     [provider]  carvedilol (COREG) 12.5 MG tablet Take 1 tablet (12.5 mg total) by mouth 2 (two) times daily with a meal. 08/03/18   Regalado, Belkys A, MD  Cholecalciferol (VITAMIN D) 2000 UNITS CAPS Take 2,000 Units by mouth daily with lunch.     [provider]  levothyroxine (SYNTHROID, LEVOTHROID) 100 MCG tablet Take 100 mcg by mouth daily at 6 (six) AM.     [provider]  Multiple Vitamin (MULTIVITAMIN) tablet Take 1 tablet by mouth daily after breakfast. "Natural Vitamin"    [provider]  OVER THE COUNTER MEDICATION BDZ tablets: Take 1 tablet by mouth at bedtime    [provider]  OVER THE COUNTER MEDICATION Vegetarian Omega Green: Take 3 capsules by mouth daily with lunch    [provider]  XARELTO 20 MG TABS tablet TAKE 1 TABLET BY MOUTH DAILY WITH DINNER Patient taking differently: Take 20 mg by mouth daily with supper.  07/27/18   Constance Haw, MD    Physical Exam: Vitals:   08/19/18 0047 08/19/18 0057 08/19/18 0200 08/19/18 0236  BP: (!) 175/113  114/61 (!) 154/74  Pulse: (!) 113  95 94  Resp: (!) 29  20 (!) 21  Temp:  98.9 F (37.2 C)    TempSrc:  Rectal    SpO2: 92%  100% 100%      Constitutional: Tachypneic with increased WOB, no pallor, no  diaphoresis  Eyes: PERTLA, lids and conjunctivae normal ENMT: Mucous membranes are moist. Posterior pharynx clear of any exudate or lesions.   Neck: normal, supple, no masses, no thyromegaly Respiratory: Tachypneic with accessory muscle recruitment. Rhonchi bilaterally. No pallor or cyanosis.   Cardiovascular: Rate ~110 and regular. Trace pedal edema bilaterally.   Abdomen: No distension, no tenderness, soft. Bowel sounds active.  Musculoskeletal: no clubbing / cyanosis. No joint deformity upper and lower extremities.   Skin: no significant rashes, lesions, ulcers. Warm, dry, well-perfused. Neurologic: CN 2-12 grossly intact. Sensation intact. Strength 5/5 in all 4 limbs.  Psychiatric: Alert and oriented x 3. Pleasant and cooperative.     Labs on Admission: I have personally reviewed following labs and imaging studies  CBC: Recent Labs  Lab 08/19/18 0050  WBC 14.2*  NEUTROABS 12.2*  HGB 14.0  HCT 45.2  MCV 96.2  PLT 517   Basic Metabolic Panel: Recent Labs  Lab 08/19/18 0050  NA 139  K 4.9  CL 105  CO2 23  GLUCOSE 330*  BUN 25*  CREATININE 1.15*  CALCIUM 8.8*   GFR: Estimated Creatinine Clearance: 37.6 mL/min (A) (by C-G formula based on SCr of 1.15 mg/dL (H)). Liver Function Tests: Recent Labs  Lab 08/19/18 0050  AST 45*  ALT 38  ALKPHOS 122  BILITOT 0.6  PROT 6.7  ALBUMIN 3.7   No results for input(s): LIPASE, AMYLASE in the last 168 hours. No results for input(s): AMMONIA in the last 168 hours. Coagulation Profile: No results for input(s): INR, PROTIME in the last 168 hours. Cardiac Enzymes: No results for input(s): CKTOTAL, CKMB, CKMBINDEX, TROPONINI in the last 168 hours. BNP (last 3 results) No results for input(s): PROBNP in the last 8760 hours. HbA1C: No results for input(s): HGBA1C in the last 72 hours. CBG: Recent Labs  Lab 08/19/18 0043  GLUCAP 294*   Lipid Profile: No results for input(s): CHOL, HDL, LDLCALC, TRIG, CHOLHDL, LDLDIRECT  in the last 72 hours. Thyroid Function Tests: No  results for input(s): TSH, T4TOTAL, FREET4, T3FREE, THYROIDAB in the last 72 hours. Anemia Panel: No results for input(s): VITAMINB12, FOLATE, FERRITIN, TIBC, IRON, RETICCTPCT in the last 72 hours. Urine analysis:    Component Value Date/Time   COLORURINE YELLOW 06/16/2018 0845   APPEARANCEUR CLEAR 06/16/2018 0845   LABSPEC 1.023 06/16/2018 0845   PHURINE 5.0 06/16/2018 0845   GLUCOSEU NEGATIVE 06/16/2018 0845   HGBUR MODERATE (A) 06/16/2018 0845   BILIRUBINUR NEGATIVE 06/16/2018 0845   KETONESUR 20 (A) 06/16/2018 0845   PROTEINUR NEGATIVE 06/16/2018 0845   UROBILINOGEN 0.2 05/10/2013 0438   NITRITE NEGATIVE 06/16/2018 0845   LEUKOCYTESUR NEGATIVE 06/16/2018 0845   Sepsis Labs: _0 (procalcitonin:4,lacticidven:4) )No results found for this or any previous visit (from the past 240 hour(s)).   Radiological Exams on Admission: Dg Chest Port 1 View  Result Date: 08/19/2018 CLINICAL DATA:  72 year old female with shortness of breath. Patient is on BiPAP. EXAM: PORTABLE CHEST 1 VIEW COMPARISON:  Chest CT dated 08/02/2018 FINDINGS: There bilateral pleural effusions with bilateral mid to lower lung field airspace densities which may represent atelectasis or infiltrate. No pneumothorax. Top-normal cardiac silhouette. Osteopenia. No acute osseous pathology. IMPRESSION: Bilateral pleural effusions and bilateral mid to lower lung field atelectasis/infiltrate. Electronically Signed   By: Anner Crete M.D.   On: 08/19/2018 01:02    EKG: Independently reviewed. Sinus tachycardia (rate 110), non-specific IVCD.   Assessment/Plan  1. HCAP; acute hypoxic respiratory failure  - Presents with acute respiratory distress with hypoxia  - CXR with bilateral infiltrates and she is found to have new leukocytosis  - She is afebrile and cough has been non-productive, but she denies orthopnea or leg swelling, denies chest pain, and pneumonia seems to  be most likely  - She was recently treated with IV antibiotics for CAP and completed Augmentin at home a few days ago  - Blood cultures collected in ED and she was treated with vancomycin and cefepime  - Check sputum culture, strep pneumo antigen, and procalcitonin  - Continue current antibiotics, continue supplemental O2, follow cultures and clinical course   2. Chronic diastolic CHF  - Presents with acute hypoxic respiratory failure but denies orthopnea, chest pain, or leg swelling  - Echo from May 2019 with preserved EF  - She was given a liter of NS in ED  - Plan to [facility] for now, follow daily wt and I/O's, continue Coreg as tolerated    3. Paroxysmal atrial fibrillation  - In sinus rhythm on admission  - CHADS-VASc at least 3 (age, gender, CHF)   - Continue Xarelto and beta-blocker    4. Mild renal insufficiency  - SCr is 1.15 on admission, up from 0.87 earlier this month  - She was given a liter of NS in the ED  - Avoid nephrotoxins, repeat chem panel in am   5. Hypothyroidism  - Continue Synthroid    DVT prophylaxis: Xarelto  Code Status: Full  Family Communication: Discussed with patient  Consults called: None Admission status: Inpatient     Vianne Bulls, MD [facility] Pager [phone]  If 7PM-7AM, please contact night-coverage [email or web address] Password [email or web address]  08/19/2018, 3:07 AM

## 2018-08-19 NOTE — ED Notes (Signed)
No addl blood draw,  Pt enroute to inpatient floor. 

## 2018-08-19 NOTE — Progress Notes (Signed)
Lab notified about am labs. Aware.

## 2018-08-19 NOTE — Progress Notes (Addendum)
Md paged pt nausea gave zofran.placed on 4lnc sats in 80's placed on NRB sats still low. Placed back on BIPAP pt stated feels better. However pt unable to tolerate it at this time replaced back on NRB.  bp 240/112.  pvc's noted.  Will continue to monitor. Saunders Revel T

## 2018-08-19 NOTE — Progress Notes (Signed)
Placed patient on BiPAP per MD order 12/6, rate of 8, 100%. RT will continue to monitor.

## 2018-08-19 NOTE — Progress Notes (Signed)
RN informed RT that she put the patient back on bipap due to falling sats.  RN stated that she would keep a close eye on patient for any more nausea and potential vomiting.  RN also gave patient Ativan.
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
## 2018-08-19 NOTE — ED Notes (Signed)
Please call [name] or Carson Meche [phone] [facility]) to let them know how pt is doing. They are out of town and need to know if they need to head back.

## 2018-08-19 NOTE — Progress Notes (Signed)
Pt placed back on bipap due to o2 sats 86.and pt labored breathing .  When asked pt earlier unable to tolerate. Will monitor close.  Saunders Revel T

## 2018-08-19 NOTE — Progress Notes (Signed)
BiPAP on standby, placed patient on 4LNC, patient tolerated well, SATS 96%, RR 22 BPM, HR 106 BPM, BP 191/65, will continue to monitor patient.

## 2018-08-19 NOTE — Progress Notes (Signed)
Moved patient from ED Room C22 to 2C04 on Bipap without incident.
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
## 2018-08-19 NOTE — ED Notes (Signed)
ED Provider at bedside. 

## 2018-08-19 NOTE — Progress Notes (Signed)
RT was called concerning patient around 04:26.  RN stated patient was nauseated and that she had given patient Zopran.  RT advised RN to put patient on NRB while patient was not feeling well.  RT went to check on patient.  Patient had increased WOB but sats were around 95.  RT advised patient that if nausea gets better, that we would have to put her back on bipap due to increased WOB.  Patient understood.  RT will continue to monitor.

## 2018-08-19 NOTE — Progress Notes (Signed)
Pharmacy Antibiotic Note  [name] is a 72 y.o. female admitted on 08/19/2018 with pneumonia.  Pharmacy has been consulted for Vancomycin/Cefepime dosing. Presents to the ED with cough, shortness of breath, fatigue. WBC elevated. CrCl ~35-40.   Plan: Vancomycin 1000 mg IV q24h Cefepime 1g IV q12h Trend WBC, temp, renal function  F/U infectious work-up Drug levels as indicated   Weight: 137 lb 5.6 oz (62.3 kg)  Temp (24hrs), Avg:98.9 F (37.2 C), Min:98.9 F (37.2 C), Max:98.9 F (37.2 C)  Recent Labs  Lab 08/19/18 0050 08/19/18 0059  WBC 14.2*  --   CREATININE 1.15*  --   LATICACIDVEN  --  3.29*    Estimated Creatinine Clearance: 37.4 mL/min (A) (by C-G formula based on SCr of 1.15 mg/dL (H)).    Allergies  Allergen Reactions  . Ace Inhibitors Cough  . Benazepril Cough  . Epinephrine Other (See Comments)    Feels "buzzed" and heart rate increases  . Iodinated Diagnostic Agents Other (See Comments)    Sneezing   . Potassium-Containing Compounds Nausea And Vomiting    Projectile vomiting--liquid potassium   . Prednisolone Other (See Comments)    Arrhythmia(s)  . Taxotere [Docetaxel] Other (See Comments)    Fluid overload     Narda Bonds 08/19/2018 4:23 AM

## 2018-08-19 NOTE — Progress Notes (Signed)
72 year old female admitted early this morning by my partner with acute hypoxic respiratory failure secondary to healthcare associated pneumonia.  Patient failed outpatient treatment with Augmentin.  Patient is being covered with Vanco and cefepime.  At this time she remains on BiPAP due to desaturation.  She appears to be tolerating BiPAP with good.  Lactic acid decreasing.  Hyperkalemia noted at 5.6 with a creatinine 0.88 procalcitonin 0.22 MRSA PCR negative.  Strep pneumo antigen negative.  Follow-up labs tomorrow.  Chest x-ray 9 1 bilateral pleural effusions and bilateral mid to lower lung field atelectasis versus infiltrate.

## 2018-08-20 DIAGNOSIS — I779 Disorder of arteries and arterioles, unspecified: Secondary | ICD-10-CM

## 2018-08-20 LAB — BASIC METABOLIC PANEL
Anion gap: 5 (ref 5–15)
BUN: 12 mg/dL (ref 8–23)
CHLORIDE: 108 mmol/L (ref 98–111)
CO2: 27 mmol/L (ref 22–32)
CREATININE: 0.73 mg/dL (ref 0.44–1.00)
Calcium: 8.6 mg/dL — ABNORMAL LOW (ref 8.9–10.3)
GFR calc Af Amer: >60 mL/min (ref >60)
GLUCOSE: 107 mg/dL — AB (ref 70–99)
Potassium: 3.8 mmol/L (ref 3.5–5.1)
SODIUM: 140 mmol/L (ref 135–145)

## 2018-08-20 LAB — RESPIRATORY PANEL BY PCR
Adenovirus: NOT DETECTED
BORDETELLA PERTUSSIS-RVPCR: NOT DETECTED
CHLAMYDOPHILA PNEUMONIAE-RVPPCR: NOT DETECTED
CORONAVIRUS HKU1-RVPPCR: NOT DETECTED
Coronavirus 229E: NOT DETECTED
Coronavirus NL63: NOT DETECTED
Coronavirus OC43: NOT DETECTED
INFLUENZA B-RVPPCR: NOT DETECTED
Influenza A: NOT DETECTED
METAPNEUMOVIRUS-RVPPCR: NOT DETECTED
Mycoplasma pneumoniae: NOT DETECTED
PARAINFLUENZA VIRUS 2-RVPPCR: NOT DETECTED
PARAINFLUENZA VIRUS 3-RVPPCR: NOT DETECTED
Parainfluenza Virus 1: NOT DETECTED
Parainfluenza Virus 4: NOT DETECTED
RESPIRATORY SYNCYTIAL VIRUS-RVPPCR: NOT DETECTED
RHINOVIRUS / ENTEROVIRUS - RVPPCR: NOT DETECTED

## 2018-08-20 LAB — MAGNESIUM: MAGNESIUM: 1.8 mg/dL (ref 1.7–2.4)

## 2018-08-20 LAB — CBC
HCT: 37.8 % (ref 36.0–46.0)
Hemoglobin: 12 g/dL (ref 12.0–15.0)
MCH: 30 pg (ref 26.0–34.0)
MCHC: 31.7 g/dL (ref 30.0–36.0)
MCV: 94.5 fL (ref 78.0–100.0)
PLATELETS: 286 10*3/uL (ref 150–400)
RBC: 4 MIL/uL (ref 3.87–5.11)
RDW: 15.1 % (ref 11.5–15.5)
WBC: 13.3 10*3/uL — ABNORMAL HIGH (ref 4.0–10.5)

## 2018-08-20 LAB — LEGIONELLA PNEUMOPHILA SEROGP 1 UR AG: L. PNEUMOPHILA SEROGP 1 UR AG: NEGATIVE

## 2018-08-20 LAB — GLUCOSE, CAPILLARY
GLUCOSE-CAPILLARY: 106 mg/dL — AB (ref 70–99)
Glucose-Capillary: 103 mg/dL — ABNORMAL HIGH (ref 70–99)
Glucose-Capillary: 106 mg/dL — ABNORMAL HIGH (ref 70–99)
Glucose-Capillary: 112 mg/dL — ABNORMAL HIGH (ref 70–99)
Glucose-Capillary: 146 mg/dL — ABNORMAL HIGH (ref 70–99)
Glucose-Capillary: 99 mg/dL (ref 70–99)

## 2018-08-20 LAB — PROCALCITONIN: PROCALCITONIN: 0.36 ng/mL

## 2018-08-20 MED ORDER — MAGNESIUM SULFATE 2 GM/50ML IV SOLN
2.0000 g | Freq: Once | INTRAVENOUS | Status: AC
  Administered 2018-08-20: 2 g via INTRAVENOUS
  Filled 2018-08-20: qty 50

## 2018-08-20 MED ORDER — SODIUM CHLORIDE 3 % IN NEBU
4.0000 mL | INHALATION_SOLUTION | Freq: Three times a day (TID) | RESPIRATORY_TRACT | Status: DC
  Filled 2018-08-20 (×2): qty 4

## 2018-08-20 MED ORDER — IPRATROPIUM BROMIDE 0.02 % IN SOLN: 0.5000 mg | Freq: Four times a day (QID) | RESPIRATORY_TRACT | Status: DC | PRN

## 2018-08-20 MED ORDER — IPRATROPIUM BROMIDE 0.02 % IN SOLN
0.5000 mg | Freq: Four times a day (QID) | RESPIRATORY_TRACT | Status: DC
  Administered 2018-08-20: 0.5 mg via RESPIRATORY_TRACT
  Filled 2018-08-20: qty 2.5

## 2018-08-20 MED ORDER — LABETALOL HCL 5 MG/ML IV SOLN
10.0000 mg | Freq: Four times a day (QID) | INTRAVENOUS | Status: DC | PRN
  Administered 2018-08-20 – 2018-08-24 (×3): 10 mg via INTRAVENOUS
  Filled 2018-08-20 (×3): qty 4

## 2018-08-20 MED ORDER — DM-GUAIFENESIN ER 30-600 MG PO TB12
1.0000 | ORAL_TABLET | Freq: Two times a day (BID) | ORAL | Status: DC
  Administered 2018-08-20 – 2018-08-24 (×9): 1 via ORAL
  Filled 2018-08-20 (×9): qty 1

## 2018-08-20 NOTE — Progress Notes (Signed)
RT NOTES: Patient placed on 7lpm HFNC per MD order. Patient states she is comfortable. No distress noted. Will continue to monitor. RN aware.

## 2018-08-20 NOTE — Plan of Care (Signed)

## 2018-08-20 NOTE — Progress Notes (Signed)
PROGRESS NOTE  [name] DTO:[id number] DOB: 1946/10/21 DOA: 08/19/2018 PCP: [name], MD  HPI/Recap of past 24 hours: [name] is a 72 y.o. female with medical history significant for paroxysmal atrial fibrillation on Xarelto, chronic diastolic CHF, hypothyroidism, and recent admission for pneumonia, now presenting to the emergency department with shortness of breath, cough, lethargy, and generalized weakness. Patient was discharged from the hospital on 08/03/2018, completed a course of Augmentin at home, felt as though her respiratory status had returned to normal, but she was experiencing persistent fatigue and generalized weakness.  Progressive dyspnea.  Called EMS. She was found to be saturating around 50% on room air and was treated with nebs prior to arrival in the ED. Upon arrival to the ED, patient is found to be afebrile, saturating low 90s on BiPAP, tachypneic with a rate of 30, tachycardic in the 110s, and with stable blood pressure.  Admitted for acute hypoxic respiratory failure secondary to H CAP.  08/20/2018: Patient seen and examined at her bedside.  She is on BiPAP and saturating in the mid 90s.  Weaned off to high flow nasal cannula 7 L and tolerating well.  Denies any chest pain.  Reports nonproductive cough.    Assessment/Plan: Principal Problem:   HCAP (healthcare-associated pneumonia) Active Problems:   Hypothyroidism   Chronic diastolic CHF (congestive heart failure) (HCC)   Carotid artery disease (HCC)   PAF (paroxysmal atrial fibrillation) (HCC)   Acute respiratory failure with hypoxia (HCC)   Renal insufficiency   Acute hypoxic hypercarbic respiratory failure most likely secondary to HCAP Recently discharged treated with IV antibiotics and completed a course of Augmentin Blood cultures x2 no growth in 1 day Continue IV cefepime MRSA negative DC vancomycin Continue up supplementation O2 to maintain O2 saturation 92% or greater Continue to  monitor vital signs Obtain respiratory viral panel  QTC prolongation Twelve-lead EKG done on 08/20/2018 revealed QTC at 523, sinus rhythm with right axis deviation Denies chest pain Avoid QTC prolonging agents  Chronic diastolic CHF Last 2D echo done on 04/29/2018 revealed preserved EF Grade 2 diastolic dysfunction with no wall motion abnormality Strict I's and O's and daily weights Continue cardiac medications  Paroxysmal A. fib on Xarelto Continue Xarelto for CVA prophylaxis Continue Coreg for rate control  Hypothyroidism Continue Synthroid  AKI Resolved Plan on presentation 1.15 Creatinine today 0.73 Avoid nephrotoxic agents/dehydration/hypotension  Hypomagnesemia Magnesium 1.8 Repleted with IV magnesium 2 g once  Code Status: Full code  Family Communication: None at bedside  Disposition Plan: Home possibly 1 to 2 days   Consultants:  None  Procedures:  None  Antimicrobials:  IV cefepime  DVT prophylaxis: Xarelto   Objective: Vitals:   08/20/18 0400 08/20/18 0731 08/20/18 0803 08/20/18 0844  BP: (!) 159/59 (!) 159/59 (!) 184/69 (!) 139/55  Pulse: (!) 105 99 99 (!) 105  Resp: (!) 22 19 18  (!) 23  Temp:   98.2 F (36.8 C)   TempSrc:   Axillary   SpO2: 98% 98% 98% 97%  Weight:        Intake/Output Summary (Last 24 hours) at 08/20/2018 1002 Last data filed at 08/20/2018 9833 Gross per 24 hour  Intake 955.83 ml  Output 300 ml  Net 655.83 ml   Filed Weights   08/19/18 0414 08/20/18 0351  Weight: 62.3 kg 62.9 kg    Exam:  . General: 72 y.o. year-old female well developed well nourished in no acute distress.  Alert and oriented x3. Marland Kitchen  Cardiovascular: Regular rate and rhythm with no rubs or gallops.  No thyromegaly or JVD noted.   Marland Kitchen Respiratory: Diffuse rales bilaterally with no wheezes. Good inspiratory effort. . Abdomen: Soft nontender nondistended with normal bowel sounds x4 quadrants. . Musculoskeletal: No lower extremity edema. 2/4 pulses in  all 4 extremities. . Skin: No ulcerative lesions noted or rashes, . Psychiatry: Mood is appropriate for condition and setting   Data Reviewed: CBC: Recent Labs  Lab 08/19/18 0050 08/20/18 0306  WBC 14.2* 13.3*  NEUTROABS 12.2*  --   HGB 14.0 12.0  HCT 45.2 37.8  MCV 96.2 94.5  PLT 332 694   Basic Metabolic Panel: Recent Labs  Lab 08/19/18 0050 08/19/18 0741 08/20/18 0306  NA 139 141 140  K 4.9 5.6* 3.8  CL 105 109 108  CO2 23 24 27   GLUCOSE 330* 144* 107*  BUN 25* 24* 12  CREATININE 1.15* 0.88 0.73  CALCIUM 8.8* 8.6* 8.6*  MG  --   --  1.8   GFR: Estimated Creatinine Clearance: 54 mL/min (by C-G formula based on SCr of 0.73 mg/dL). Liver Function Tests: Recent Labs  Lab 08/19/18 0050  AST 45*  ALT 38  ALKPHOS 122  BILITOT 0.6  PROT 6.7  ALBUMIN 3.7   No results for input(s): LIPASE, AMYLASE in the last 168 hours. No results for input(s): AMMONIA in the last 168 hours. Coagulation Profile: No results for input(s): INR, PROTIME in the last 168 hours. Cardiac Enzymes: No results for input(s): CKTOTAL, CKMB, CKMBINDEX, TROPONINI in the last 168 hours. BNP (last 3 results) No results for input(s): PROBNP in the last 8760 hours. HbA1C: No results for input(s): HGBA1C in the last 72 hours. CBG: Recent Labs  Lab 08/19/18 1626 08/19/18 1937 08/19/18 2357 08/20/18 0426 08/20/18 0801  GLUCAP 85 110* 146* 106* 103*   Lipid Profile: No results for input(s): CHOL, HDL, LDLCALC, TRIG, CHOLHDL, LDLDIRECT in the last 72 hours. Thyroid Function Tests: No results for input(s): TSH, T4TOTAL, FREET4, T3FREE, THYROIDAB in the last 72 hours. Anemia Panel: No results for input(s): VITAMINB12, FOLATE, FERRITIN, TIBC, IRON, RETICCTPCT in the last 72 hours. Urine analysis:    Component Value Date/Time   COLORURINE YELLOW 08/19/2018 0301   APPEARANCEUR HAZY (A) 08/19/2018 0301   LABSPEC 1.015 08/19/2018 0301   PHURINE 6.0 08/19/2018 0301   GLUCOSEU 50 (A)  08/19/2018 0301   HGBUR NEGATIVE 08/19/2018 0301   BILIRUBINUR NEGATIVE 08/19/2018 0301   KETONESUR NEGATIVE 08/19/2018 0301   PROTEINUR 100 (A) 08/19/2018 0301   UROBILINOGEN 0.2 05/10/2013 0438   NITRITE NEGATIVE 08/19/2018 0301   LEUKOCYTESUR NEGATIVE 08/19/2018 0301   Sepsis Labs: @LABRCNTIP (procalcitonin:4,lacticidven:4)  ) Recent Results (from the past 240 hour(s))  Blood Culture (routine x 2)     Status: None (Preliminary result)   Collection Time: 08/19/18  1:30 AM  Result Value Ref Range Status   Specimen Description BLOOD LEFT UPPER ARM  Final   Special Requests   Final    BOTTLES DRAWN AEROBIC AND ANAEROBIC Blood Culture adequate volume   Culture   Final    NO GROWTH 1 DAY Performed at [facility], [place] [address], [place], [place] [address]    Report Status PENDING  Incomplete  Blood Culture (routine x 2)     Status: None (Preliminary result)   Collection Time: 08/19/18  1:35 AM  Result Value Ref Range Status   Specimen Description BLOOD LEFT WRIST  Final   Special Requests  Final    BOTTLES DRAWN AEROBIC AND ANAEROBIC Blood Culture results may not be optimal due to an inadequate volume of blood received in culture bottles   Culture   Final    NO GROWTH 1 DAY Performed at [facility] [address], [place], [place] [address]    Report Status PENDING  Incomplete  MRSA PCR Screening     Status: None   Collection Time: 08/19/18  4:07 AM  Result Value Ref Range Status   MRSA by PCR NEGATIVE NEGATIVE Final    Comment:        The GeneXpert MRSA Assay (FDA approved for NASAL specimens only), is one component of a comprehensive MRSA colonization surveillance program. It is not intended to diagnose MRSA infection nor to guide or monitor treatment for MRSA infections. Performed at [facility], [place] [address] [place], [place] [address]       Studies: No results found.  Scheduled Meds: . atorvastatin  10 mg Oral q1800  . B-complex  with vitamin C  1 tablet Oral Daily  . calcium carbonate  1,250 mg Oral QHS  . carvedilol  12.5 mg Oral BID WC  . cholecalciferol  2,000 Units Oral Q lunch  . dextromethorphan-guaiFENesin  1 tablet Oral BID  . insulin aspart  0-9 Units Subcutaneous Q4H  . ipratropium  0.5 mg Nebulization Q6H  . levothyroxine  100 mcg Oral QAC breakfast  . multivitamin with minerals  1 tablet Oral Daily  . rivaroxaban  20 mg Oral Q supper  . sodium chloride flush  3 mL Intravenous Q12H  . sodium chloride flush  3 mL Intravenous Q12H  . sodium chloride HYPERTONIC  4 mL Nebulization TID    Continuous Infusions: . sodium chloride    . ceFEPime (MAXIPIME) IV 1 g (08/20/18 0932)     LOS: 1 day     Kayleen Memos, MD [facility] Pager [phone]  If 7PM-7AM, please contact night-coverage [email or web address] Password [email or web address] 08/20/2018, 10:02 AM

## 2018-08-20 NOTE — Progress Notes (Signed)
Pt currently on 5lpm HFNC.  Sats 99% with respiratory rate of 20.  BIPAP on stand-by at bedside.  Will cont to monitor progress.

## 2018-08-21 ENCOUNTER — Inpatient Hospital Stay: Payer: Medicare Other | Admitting: Hematology and Oncology

## 2018-08-21 LAB — GLUCOSE, CAPILLARY
GLUCOSE-CAPILLARY: 95 mg/dL (ref 70–99)
GLUCOSE-CAPILLARY: 123 mg/dL — AB (ref 70–99)
Glucose-Capillary: 103 mg/dL — ABNORMAL HIGH (ref 70–99)
Glucose-Capillary: 137 mg/dL — ABNORMAL HIGH (ref 70–99)
Glucose-Capillary: 159 mg/dL — ABNORMAL HIGH (ref 70–99)
Glucose-Capillary: 97 mg/dL (ref 70–99)
Glucose-Capillary: 98 mg/dL (ref 70–99)

## 2018-08-21 LAB — BASIC METABOLIC PANEL
ANION GAP: 12 (ref 5–15)
BUN: 11 mg/dL (ref 8–23)
CO2: 22 mmol/L (ref 22–32)
Calcium: 8.8 mg/dL — ABNORMAL LOW (ref 8.9–10.3)
Chloride: 103 mmol/L (ref 98–111)
Creatinine, Ser: 0.64 mg/dL (ref 0.44–1.00)
GFR calc non Af Amer: >60 mL/min (ref >60)
Glucose, Bld: 97 mg/dL (ref 70–99)
Potassium: 3.9 mmol/L (ref 3.5–5.1)
Sodium: 137 mmol/L (ref 135–145)

## 2018-08-21 LAB — CBC
HEMATOCRIT: 40.9 % (ref 36.0–46.0)
Hemoglobin: 12.7 g/dL (ref 12.0–15.0)
MCH: 29.9 pg (ref 26.0–34.0)
MCHC: 31.1 g/dL (ref 30.0–36.0)
MCV: 96.2 fL (ref 78.0–100.0)
PLATELETS: 268 10*3/uL (ref 150–400)
RBC: 4.25 MIL/uL (ref 3.87–5.11)
RDW: 15 % (ref 11.5–15.5)
WBC: 14.3 10*3/uL — AB (ref 4.0–10.5)

## 2018-08-21 MED ORDER — AMLODIPINE BESYLATE 10 MG PO TABS
10.0000 mg | ORAL_TABLET | Freq: Every day | ORAL | Status: DC
  Administered 2018-08-21 – 2018-08-24 (×4): 10 mg via ORAL
  Filled 2018-08-21 (×4): qty 1

## 2018-08-21 MED ORDER — METOCLOPRAMIDE HCL 5 MG/ML IJ SOLN
5.0000 mg | Freq: Once | INTRAMUSCULAR | Status: AC
  Administered 2018-08-21: 5 mg via INTRAVENOUS
  Filled 2018-08-21: qty 2

## 2018-08-21 MED ORDER — LORAZEPAM 2 MG/ML IJ SOLN
1.0000 mg | INTRAMUSCULAR | Status: DC | PRN
  Administered 2018-08-21: 1 mg via INTRAVENOUS
  Filled 2018-08-21 (×2): qty 1

## 2018-08-21 MED ORDER — ALBUTEROL (5 MG/ML) CONTINUOUS INHALATION SOLN: 2.5000 mg/h | INHALATION_SOLUTION | RESPIRATORY_TRACT | Status: DC

## 2018-08-21 MED ORDER — METHYLPREDNISOLONE SODIUM SUCC 125 MG IJ SOLR
60.0000 mg | Freq: Two times a day (BID) | INTRAMUSCULAR | Status: DC
  Administered 2018-08-21 – 2018-08-22 (×2): 60 mg via INTRAVENOUS
  Filled 2018-08-21 (×2): qty 2

## 2018-08-21 MED ORDER — IPRATROPIUM-ALBUTEROL 0.5-2.5 (3) MG/3ML IN SOLN
3.0000 mL | Freq: Three times a day (TID) | RESPIRATORY_TRACT | Status: DC
  Administered 2018-08-22: 3 mL via RESPIRATORY_TRACT
  Filled 2018-08-21: qty 3

## 2018-08-21 MED ORDER — LORAZEPAM BOLUS VIA INFUSION
1.0000 mg | INTRAVENOUS | Status: DC | PRN
  Filled 2018-08-21: qty 1

## 2018-08-21 MED ORDER — IPRATROPIUM-ALBUTEROL 0.5-2.5 (3) MG/3ML IN SOLN: 3.0000 mL | RESPIRATORY_TRACT | Status: DC | PRN

## 2018-08-21 MED ORDER — POLYETHYLENE GLYCOL 3350 17 G PO PACK
17.0000 g | PACK | Freq: Every day | ORAL | Status: DC
  Administered 2018-08-21 – 2018-08-24 (×4): 17 g via ORAL
  Filled 2018-08-21 (×4): qty 1

## 2018-08-21 MED ORDER — IPRATROPIUM-ALBUTEROL 0.5-2.5 (3) MG/3ML IN SOLN
3.0000 mL | Freq: Four times a day (QID) | RESPIRATORY_TRACT | Status: DC
  Administered 2018-08-21: 3 mL via RESPIRATORY_TRACT
  Filled 2018-08-21: qty 3

## 2018-08-21 MED ORDER — LORAZEPAM BOLUS VIA INFUSION: 1.0000 mg | INTRAVENOUS | Status: DC | PRN

## 2018-08-21 MED ORDER — METHYLPREDNISOLONE SODIUM SUCC 125 MG IJ SOLR
60.0000 mg | Freq: Once | INTRAMUSCULAR | Status: AC
  Administered 2018-08-21: 60 mg via INTRAVENOUS
  Filled 2018-08-21: qty 2

## 2018-08-21 MED ORDER — ALBUTEROL SULFATE (2.5 MG/3ML) 0.083% IN NEBU
2.5000 mg | INHALATION_SOLUTION | RESPIRATORY_TRACT | Status: DC | PRN
  Filled 2018-08-21: qty 3

## 2018-08-21 NOTE — Plan of Care (Signed)
  Problem: Education: Goal: Knowledge of General Education information will improve Description Including pain rating scale, medication(s)/side effects and non-pharmacologic comfort measures Outcome: Progressing   Problem: Health Behavior/Discharge Planning: Goal: Ability to manage health-related needs will improve Outcome: Progressing   Problem: Clinical Measurements: Goal: Ability to maintain clinical measurements within normal limits will improve Outcome: Progressing Goal: Will remain free from infection Outcome: Progressing Goal: Diagnostic test results will improve Outcome: Progressing Goal: Respiratory complications will improve Outcome: Progressing   Problem: Activity: Goal: Risk for activity intolerance will decrease Outcome: Progressing   Problem: Nutrition: Goal: Adequate nutrition will be maintained Outcome: Progressing   Problem: Pain Managment: Goal: General experience of comfort will improve Outcome: Progressing   Problem: Safety: Goal: Ability to remain free from injury will improve Outcome: Progressing   Problem: Skin Integrity: Goal: Risk for impaired skin integrity will decrease Outcome: Progressing   Problem: Clinical Measurements: Goal: Cardiovascular complication will be avoided Outcome: Not Progressing   Problem: Coping: Goal: Level of anxiety will decrease Outcome: Not Progressing   Problem: Elimination: Goal: Will not experience complications related to bowel motility Outcome: Not Progressing

## 2018-08-21 NOTE — Progress Notes (Signed)
PT Cancellation Note  Patient Details Name: [name] MRN: [id number] DOB: 21-Oct-1946   Cancelled Treatment:    Reason Eval/Treat Not Completed: (pt refused due to "I cant breathe".) Pt very anxious re: difficulty breathing and not feeling well. Dr. Nevada Crane in room when pt deferred PT and stated it was fine to start tomorrow. Acute PT to return as able to complete PT eval.  Kittie Plater, PT, DPT Acute Rehabilitation Services Pager #: [phone] Office #: [phone]    Berline Lopes 08/21/2018, 9:52 AM

## 2018-08-21 NOTE — Progress Notes (Signed)
RT NOTES: Called to room for PRN breathing treatment by RN, RN states family is requesting. Upon entering room, patient is asleep with no distress noted, no accessory muscle use. Sats 97%, HR 78. BBS clear. Breathing treatment not indicated at this time. Will continue to monitor.

## 2018-08-21 NOTE — Progress Notes (Signed)
PROGRESS NOTE  [name] YTK:[id number] DOB: 07/03/1946 DOA: 08/19/2018 PCP: [name], MD  HPI/Recap of past 24 hours: [name] is a 72 y.o. female with medical history significant for paroxysmal atrial fibrillation on Xarelto, chronic diastolic CHF, hypothyroidism, and recent admission for pneumonia, now presenting to the emergency department with shortness of breath, cough, lethargy, and generalized weakness. Patient was discharged from the hospital on 08/03/2018, completed a course of Augmentin at home, felt as though her respiratory status had returned to normal, but she was experiencing persistent fatigue and generalized weakness.  Progressive dyspnea.  Called EMS. She was found to be saturating around 50% on room air and was treated with nebs prior to arrival in the ED. Upon arrival to the ED, patient is found to be afebrile, saturating low 90s on BiPAP, tachypneic with a rate of 30, tachycardic in the 110s, and with stable blood pressure.  Admitted for acute hypoxic respiratory failure secondary to HCAP.  Initially on BiPAP which was weaned off to high flow nasal cannula on 08/20/2018.  08/21/2018: Patient seen and examined at her bedside.  She reports dyspnea at rest.  Started albuterol nebs and Solu-Medrol IV.  Currently on 5 L of oxygen by nasal cannula and saturating in the low to mid 90s   Assessment/Plan: Principal Problem:   HCAP (healthcare-associated pneumonia) Active Problems:   Hypothyroidism   Chronic diastolic CHF (congestive heart failure) (HCC)   Carotid artery disease (HCC)   PAF (paroxysmal atrial fibrillation) (HCC)   Acute respiratory failure with hypoxia (HCC)   Renal insufficiency   Acute hypoxic hypercarbic respiratory failure most likely secondary to HCAP Recently discharged treated with IV antibiotics and completed a course of Augmentin Blood cultures x2 no growth to date Continue IV cefepime MRSA negative DC vancomycin Continue up  supplementation O2 to maintain O2 saturation 92% or greater Continue to monitor vital signs Negative respiratory viral panel Start DuoNeb every 6 hours and every 2 hours as needed Start hypersaline nebs 3 times daily Start Mucinex twice daily Start IV Solu-Medrol 60 mg twice daily  Accelerated hypertension Start amlodipine 10 mg daily Continue Coreg 12.5 mg twice daily IV labetalol 10 mg q6h prn for SBP>180 or DBP>105  QTC prolongation Twelve-lead EKG done on 08/20/2018 revealed QTC at 523, sinus rhythm with right axis deviation Denies chest pain Avoid QTC prolonging agents Repeat twelve-lead EKG in the morning 04/22/7321  Chronic diastolic CHF Last 2D echo done on 04/29/2018 revealed preserved EF Grade 2 diastolic dysfunction with no wall motion abnormality Strict I's and O's and daily weights Continue cardiac medications  Paroxysmal A. fib on Xarelto Continue Xarelto for CVA prophylaxis Continue Coreg for rate control  Hypothyroidism Continue Synthroid  AKI Resolved Cr on presentation 1.15 Creatinine today 0.73 Avoid nephrotoxic agents/dehydration/hypotension  Hypomagnesemia Magnesium 1.8 Repleted with IV magnesium 2 g once  Code Status: Full code  Family Communication: None at bedside  Disposition Plan: Home possibly 1 to 2 days; Obtain Home O2 evaluation prior to DC.   Consultants:  None  Procedures:  None  Antimicrobials:  IV cefepime  DVT prophylaxis: Xarelto   Objective: Vitals:   08/21/18 1000 08/21/18 1030 08/21/18 1100 08/21/18 1133  BP: (!) 125/46 (!) 86/40 (!) 83/48 (!) 141/52  Pulse:    95  Resp:    (!) 21  Temp:    97.9 F (36.6 C)  TempSrc:    Oral  SpO2:    94%  Weight:  Intake/Output Summary (Last 24 hours) at 08/21/2018 1451 Last data filed at 08/21/2018 0400 Gross per 24 hour  Intake 496 ml  Output -  Net 496 ml   Filed Weights   08/19/18 0414 08/20/18 0351 08/21/18 0300  Weight: 62.3 kg 62.9 kg 63.4 kg     Exam:  . General: 73 y.o. year-old female WD WN NAD A&O x 3.. . Cardiovascular: RRR no rubs or gallops. No JVD or thyromegaly..   . Respiratory: Diffuse rales bilaterally with no wheezes. Good inspiratory effort. . Abdomen: Soft nontender nondistended with normal bowel sounds x4 quadrants. . Musculoskeletal: No lower extremity edema. 2/4 pulses in all 4 extremities. . Skin: No ulcerative lesions noted or rashes, . Psychiatry: Mood is appropriate for condition and setting   Data Reviewed: CBC: Recent Labs  Lab 08/19/18 0050 08/20/18 0306 08/21/18 0258  WBC 14.2* 13.3* 14.3*  NEUTROABS 12.2*  --   --   HGB 14.0 12.0 12.7  HCT 45.2 37.8 40.9  MCV 96.2 94.5 96.2  PLT 332 286 465   Basic Metabolic Panel: Recent Labs  Lab 08/19/18 0050 08/19/18 0741 08/20/18 0306 08/21/18 0258  NA 139 141 140 137  K 4.9 5.6* 3.8 3.9  CL 105 109 108 103  CO2 23 24 27 22   GLUCOSE 330* 144* 107* 97  BUN 25* 24* 12 11  CREATININE 1.15* 0.88 0.73 0.64  CALCIUM 8.8* 8.6* 8.6* 8.8*  MG  --   --  1.8  --    GFR: Estimated Creatinine Clearance: 54.2 mL/min (by C-G formula based on SCr of 0.64 mg/dL). Liver Function Tests: Recent Labs  Lab 08/19/18 0050  AST 45*  ALT 38  ALKPHOS 122  BILITOT 0.6  PROT 6.7  ALBUMIN 3.7   No results for input(s): LIPASE, AMYLASE in the last 168 hours. No results for input(s): AMMONIA in the last 168 hours. Coagulation Profile: No results for input(s): INR, PROTIME in the last 168 hours. Cardiac Enzymes: No results for input(s): CKTOTAL, CKMB, CKMBINDEX, TROPONINI in the last 168 hours. BNP (last 3 results) No results for input(s): PROBNP in the last 8760 hours. HbA1C: No results for input(s): HGBA1C in the last 72 hours. CBG: Recent Labs  Lab 08/20/18 2035 08/20/18 2340 08/21/18 0354 08/21/18 0729 08/21/18 1147  GLUCAP 99 103* 98 95 123*   Lipid Profile: No results for input(s): CHOL, HDL, LDLCALC, TRIG, CHOLHDL, LDLDIRECT in the last  72 hours. Thyroid Function Tests: No results for input(s): TSH, T4TOTAL, FREET4, T3FREE, THYROIDAB in the last 72 hours. Anemia Panel: No results for input(s): VITAMINB12, FOLATE, FERRITIN, TIBC, IRON, RETICCTPCT in the last 72 hours. Urine analysis:    Component Value Date/Time   COLORURINE YELLOW 08/19/2018 0301   APPEARANCEUR HAZY (A) 08/19/2018 0301   LABSPEC 1.015 08/19/2018 0301   PHURINE 6.0 08/19/2018 0301   GLUCOSEU 50 (A) 08/19/2018 0301   HGBUR NEGATIVE 08/19/2018 0301   BILIRUBINUR NEGATIVE 08/19/2018 0301   KETONESUR NEGATIVE 08/19/2018 0301   PROTEINUR 100 (A) 08/19/2018 0301   UROBILINOGEN 0.2 05/10/2013 0438   NITRITE NEGATIVE 08/19/2018 0301   LEUKOCYTESUR NEGATIVE 08/19/2018 0301   Sepsis Labs: @LABRCNTIP (procalcitonin:4,lacticidven:4)  ) Recent Results (from the past 240 hour(s))  Blood Culture (routine x 2)     Status: None (Preliminary result)   Collection Time: 08/19/18  1:30 AM  Result Value Ref Range Status   Specimen Description BLOOD LEFT UPPER ARM  Final   Special Requests   Final    BOTTLES DRAWN  AEROBIC AND ANAEROBIC Blood Culture adequate volume   Culture   Final    NO GROWTH 2 DAYS Performed at [facility], [place] [address] [place], [place] [address]    Report Status PENDING  Incomplete  Blood Culture (routine x 2)     Status: None (Preliminary result)   Collection Time: 08/19/18  1:35 AM  Result Value Ref Range Status   Specimen Description BLOOD LEFT WRIST  Final   Special Requests   Final    BOTTLES DRAWN AEROBIC AND ANAEROBIC Blood Culture results may not be optimal due to an inadequate volume of blood received in culture bottles   Culture   Final    NO GROWTH 2 DAYS Performed at [facility], [place] [address] [place], [place] [address]    Report Status PENDING  Incomplete  MRSA PCR Screening     Status: None   Collection Time: 08/19/18  4:07 AM  Result Value Ref Range Status   MRSA by PCR NEGATIVE NEGATIVE Final     Comment:        The GeneXpert MRSA Assay (FDA approved for NASAL specimens only), is one component of a comprehensive MRSA colonization surveillance program. It is not intended to diagnose MRSA infection nor to guide or monitor treatment for MRSA infections. Performed at [facility], [place] [address], [place], [place] [address]   Respiratory Panel by PCR     Status: None   Collection Time: 08/20/18  7:52 AM  Result Value Ref Range Status   Adenovirus NOT DETECTED NOT DETECTED Final   Coronavirus 229E NOT DETECTED NOT DETECTED Final   Coronavirus HKU1 NOT DETECTED NOT DETECTED Final   Coronavirus NL63 NOT DETECTED NOT DETECTED Final   Coronavirus OC43 NOT DETECTED NOT DETECTED Final   Metapneumovirus NOT DETECTED NOT DETECTED Final   Rhinovirus / Enterovirus NOT DETECTED NOT DETECTED Final   Influenza A NOT DETECTED NOT DETECTED Final   Influenza B NOT DETECTED NOT DETECTED Final   Parainfluenza Virus 1 NOT DETECTED NOT DETECTED Final   Parainfluenza Virus 2 NOT DETECTED NOT DETECTED Final   Parainfluenza Virus 3 NOT DETECTED NOT DETECTED Final   Parainfluenza Virus 4 NOT DETECTED NOT DETECTED Final   Respiratory Syncytial Virus NOT DETECTED NOT DETECTED Final   Bordetella pertussis NOT DETECTED NOT DETECTED Final   Chlamydophila pneumoniae NOT DETECTED NOT DETECTED Final   Mycoplasma pneumoniae NOT DETECTED NOT DETECTED Final    Comment: Performed at [facility], [place] [address], [place],  [address]      Studies: No results found.  Scheduled Meds: . amLODipine  10 mg Oral Daily  . atorvastatin  10 mg Oral q1800  . B-complex with vitamin C  1 tablet Oral Daily  . calcium carbonate  1,250 mg Oral QHS  . carvedilol  12.5 mg Oral BID WC  . cholecalciferol  2,000 Units Oral Q lunch  . dextromethorphan-guaiFENesin  1 tablet Oral BID  . insulin aspart  0-9 Units Subcutaneous Q4H  . levothyroxine  100 mcg Oral QAC breakfast  . multivitamin with minerals  1  tablet Oral Daily  . polyethylene glycol  17 g Oral Daily  . rivaroxaban  20 mg Oral Q supper  . sodium chloride flush  3 mL Intravenous Q12H  . sodium chloride flush  3 mL Intravenous Q12H    Continuous Infusions: . sodium chloride    . ceFEPime (MAXIPIME) IV 1 g (08/21/18 0932)  LOS: 2 days     Kayleen Memos, MD [facility] Pager [phone]  If 7PM-7AM, please contact night-coverage [email or web address] Password [email or web address] 08/21/2018, 2:51 PM

## 2018-08-21 NOTE — Progress Notes (Signed)
Patient resting comfortably on 4L [place] with no respiratory distress noted. BIPAP is not needed at this time. RT will monitor as needed.

## 2018-08-21 NOTE — Progress Notes (Signed)
RT NOTES: Patient assessed for breathing treatment need. Patient is asleep and resting comfortably. No respiratory distress noted. BBS clear to auscultation. Sats 96%, HR 89. Family at bedside instructed to call RT if patient wakes up and is short of breath. Will continue to assess for needs.

## 2018-08-22 ENCOUNTER — Other Ambulatory Visit: Payer: Self-pay

## 2018-08-22 ENCOUNTER — Ambulatory Visit: Payer: Medicare Other | Admitting: Physician Assistant

## 2018-08-22 ENCOUNTER — Inpatient Hospital Stay (HOSPITAL_COMMUNITY): Payer: Medicare Other

## 2018-08-22 DIAGNOSIS — J9601 Acute respiratory failure with hypoxia: Secondary | ICD-10-CM

## 2018-08-22 DIAGNOSIS — E039 Hypothyroidism, unspecified: Secondary | ICD-10-CM

## 2018-08-22 DIAGNOSIS — J189 Pneumonia, unspecified organism: Principal | ICD-10-CM

## 2018-08-22 DIAGNOSIS — I5033 Acute on chronic diastolic (congestive) heart failure: Secondary | ICD-10-CM

## 2018-08-22 DIAGNOSIS — I480 Paroxysmal atrial fibrillation: Secondary | ICD-10-CM

## 2018-08-22 LAB — MAGNESIUM: MAGNESIUM: 1.9 mg/dL (ref 1.7–2.4)

## 2018-08-22 LAB — BASIC METABOLIC PANEL
ANION GAP: 9 (ref 5–15)
BUN: 16 mg/dL (ref 8–23)
CHLORIDE: 104 mmol/L (ref 98–111)
CO2: 25 mmol/L (ref 22–32)
Calcium: 9 mg/dL (ref 8.9–10.3)
Creatinine, Ser: 0.66 mg/dL (ref 0.44–1.00)
GFR calc Af Amer: >60 mL/min (ref >60)
GFR calc non Af Amer: >60 mL/min (ref >60)
Glucose, Bld: 131 mg/dL — ABNORMAL HIGH (ref 70–99)
Potassium: 3.9 mmol/L (ref 3.5–5.1)
SODIUM: 138 mmol/L (ref 135–145)

## 2018-08-22 LAB — GLUCOSE, CAPILLARY
GLUCOSE-CAPILLARY: 128 mg/dL — AB (ref 70–99)
GLUCOSE-CAPILLARY: 159 mg/dL — AB (ref 70–99)
Glucose-Capillary: 163 mg/dL — ABNORMAL HIGH (ref 70–99)
Glucose-Capillary: 253 mg/dL — ABNORMAL HIGH (ref 70–99)
Glucose-Capillary: 171 mg/dL — ABNORMAL HIGH (ref 70–99)

## 2018-08-22 LAB — CBC
HEMATOCRIT: 39.8 % (ref 36.0–46.0)
HEMOGLOBIN: 12.5 g/dL (ref 12.0–15.0)
MCH: 29.8 pg (ref 26.0–34.0)
MCHC: 31.4 g/dL (ref 30.0–36.0)
MCV: 95 fL (ref 78.0–100.0)
Platelets: 312 10*3/uL (ref 150–400)
RBC: 4.19 MIL/uL (ref 3.87–5.11)
RDW: 14.6 % (ref 11.5–15.5)
WBC: 9.9 10*3/uL (ref 4.0–10.5)

## 2018-08-22 LAB — PROCALCITONIN: Procalcitonin: 0.11 ng/mL

## 2018-08-22 LAB — HEMOGLOBIN A1C
HEMOGLOBIN A1C: 5.8 % — AB (ref 4.8–5.6)
MEAN PLASMA GLUCOSE: 119.76 mg/dL

## 2018-08-22 MED ORDER — INSULIN ASPART 100 UNIT/ML ~~LOC~~ SOLN
0.0000 [IU] | Freq: Three times a day (TID) | SUBCUTANEOUS | Status: DC
  Administered 2018-08-22 – 2018-08-23 (×3): 2 [IU] via SUBCUTANEOUS
  Administered 2018-08-23: 1 [IU] via SUBCUTANEOUS

## 2018-08-22 MED ORDER — IPRATROPIUM-ALBUTEROL 0.5-2.5 (3) MG/3ML IN SOLN
3.0000 mL | RESPIRATORY_TRACT | Status: DC | PRN
  Administered 2018-08-23: 3 mL via RESPIRATORY_TRACT
  Filled 2018-08-22: qty 3

## 2018-08-22 MED ORDER — FUROSEMIDE 10 MG/ML IJ SOLN
40.0000 mg | Freq: Once | INTRAMUSCULAR | Status: AC
  Administered 2018-08-22: 40 mg via INTRAVENOUS
  Filled 2018-08-22: qty 4

## 2018-08-22 MED ORDER — METHYLPREDNISOLONE SODIUM SUCC 40 MG IJ SOLR
40.0000 mg | Freq: Two times a day (BID) | INTRAMUSCULAR | Status: DC
  Administered 2018-08-22 – 2018-08-23 (×2): 40 mg via INTRAVENOUS
  Filled 2018-08-22 (×2): qty 1

## 2018-08-22 NOTE — Progress Notes (Signed)
PROGRESS NOTE   [name]  AJO:[id number]    DOB: October 29, 1946    DOA: 08/19/2018  PCP: [name], MD   I have briefly reviewed patients previous medical records in [facility].  Brief Narrative:  72 year old female, lives alone, independent of activities, PMH of right breast cancer status post XRT and chemotherapy, lymphoma status post XRT (Dr. Sonny Dandy), bilateral carotid stenosis, HTN, hypothyroid, LBBB, PAF status post ablation and on Xarelto, chronic diastolic CHF, recent hospitalization 08/02/2018-08/03/2018 secondary to acute hypoxic respiratory failure due to suspected aspiration pneumonia, completed course of Augmentin at home, presented again with recurrent nonproductive cough, progressive dyspnea, lethargy and weakness.  Hypoxic with oxygen saturation 50% on room air by EMS.  Admitted to stepdown unit for suspected HCAP, acute hypoxic respiratory failure when she briefly required BiPAP, now off.  Possibly accompanied by acute on chronic diastolic CHF.  Slowly improving.   Assessment & Plan:   Principal Problem:   HCAP (healthcare-associated pneumonia) Active Problems:   Hypothyroidism   Chronic diastolic CHF (congestive heart failure) (HCC)   Carotid artery disease (HCC)   PAF (paroxysmal atrial fibrillation) (HCC)   Acute respiratory failure with hypoxia (HCC)   Renal insufficiency   Acute hypoxic respiratory failure: Not on home oxygen PTA.  Secondary to HCAP and acute on chronic diastolic CHF (possibly more CHF than pneumonia).  Treat underlying causes as below and attempt to wean off of oxygen.  Currently on 3.5 L/min nasal cannula and saturating between 88- 90%.  Not much clinical bronchospasm, reduce IV Solu-Medrol  Suspected healthcare associated pneumonia: Recently completed a course of IV antibiotics and then oral Augmentin for aspiration pneumonia.  Blood cultures x2: Negative.  MRSA PCR, RSV panel, urine pneumococcal and Legionella antigen: Negative.   Vancomycin discontinued.  Remains on IV cefepime, day 5 and consider discontinuing after today.  Chest x-ray 9/4 personally reviewed, pulmonary edema better, bilateral pleural effusions and bibasilar pulmonary opacities reported as largely chronic.  Procalcitonin 0.22 > 0.36 > 0.11.  Acute on chronic diastolic CHF: TTE 9/47/0962: LVEF 55-60% and grade 2 diastolic dysfunction.  Received Lasix IV 40 mg x 1 early this morning.  Strict intake output, daily weights.  BNP on admission 375.  Monitor closely and reassess need for IV diuresis in a.m.  Paroxysmal atrial fibrillation: Currently in mild sinus tachycardia. CHADS-VASc at least 84 (age, gender, CHF)  .  Continue carvedilol and Xarelto.  During recent admission, EP cardiology had seen and Tikosyn discontinued.  Status post ablation in the past and reportedly has seen Dr. Johnsie Cancel & Dr. Caryl Comes, Cardiology.  Acute kidney injury, mild: Resolved.  Hyperkalemia: Resolved.  Essential hypertension: Controlled.  Continue amlodipine and carvedilol.  Hyperglycemia: Likely due to stress response and steroids.  Check A1c.  Continue NovoLog sensitive SSI.  Hyperlipidemia: Continue statins.  Hypothyroid: Continue Synthroid.  ?  Prolonged QTC/chronic LBBB: Patient has chronic LBBB so not sure if the measurement is accurate.  EKG 08/22/2018: QTC 519 ms.  History of breast cancer status post right mastectomy, lymphoma: Closed with outpatient oncology.  Bone scan 8/29: No evidence of metastatic disease.   DVT prophylaxis: Currently on full dose anticoagulation with Xarelto. Code Status: Full. Family Communication: None at bedside. Disposition: Continue management in stepdown unit for additional 24 hours due to ongoing acute illness requiring high intensity monitoring and management for her acute hypoxic respiratory failure due to decompensated CHF, suspected healthcare associated pneumonia.  DC likely to home pending clinical improvement which may take additional  48 to 72 hours.   Consultants:  None  Procedures:  BiPAP  Antimicrobials:  Vancomycin-discontinued. Cefepime   Subjective: Overall feels much better with improved dyspnea but breathing not yet at baseline.  "I feel 50-75% better".  Intermittent dry cough.  No chest pain.  States that she usually walks her dog "1300 feet twice daily".  She reports chronic diarrhea for which she has been seen by [facility] and work-up reportedly negative.  ROS: As above otherwise negative.  Objective:  Vitals:   08/22/18 0728 08/22/18 0800 08/22/18 0900 08/22/18 1037  BP: (!) 167/66 (!) 149/48 130/84 (!) 141/61  Pulse: (!) 110 (!) 112 (!) 105 (!) 101  Resp: (!) 27 19 (!) 21 (!) 23  Temp: 98.1 F (36.7 C)   98 F (36.7 C)  TempSrc: Oral   Oral  SpO2: 90% 90% 91% (!) 89%  Weight:        Examination:  General exam: Pleasant elderly female, small built and thinly nourished, lying comfortably propped up in bed without distress. Respiratory system: Diminished breath sounds in the bases with scattered few bibasilar crackles.  Rest of lung fields clear to auscultation without wheezing or rhonchi. Respiratory effort normal. Cardiovascular system: S1 & S2 heard, RRR. No murmurs, rubs, gallops or clicks.?  Mild JVD.  No pedal edema.  Telemetry personally reviewed: Sinus tachycardia in the 100s, BBB morphology, frequent PVCs. Gastrointestinal system: Abdomen is nondistended, soft and nontender. No organomegaly or masses felt. Normal bowel sounds heard. Central nervous system: Alert and oriented. No focal neurological deficits. Extremities: Symmetric 5 x 5 power. Skin: No rashes, lesions or ulcers Psychiatry: Judgement and insight appear normal. Mood & affect appropriate.     Data Reviewed: I have personally reviewed following labs and imaging studies  CBC: Recent Labs  Lab 08/19/18 0050 08/20/18 0306 08/21/18 0258 08/22/18 0233  WBC 14.2* 13.3* 14.3* 9.9  NEUTROABS 12.2*  --   --   --   HGB  14.0 12.0 12.7 12.5  HCT 45.2 37.8 40.9 39.8  MCV 96.2 94.5 96.2 95.0  PLT 332 286 268 643   Basic Metabolic Panel: Recent Labs  Lab 08/19/18 0050 08/19/18 0741 08/20/18 0306 08/21/18 0258 08/22/18 0233  NA 139 141 140 137 138  K 4.9 5.6* 3.8 3.9 3.9  CL 105 109 108 103 104  CO2 23 24 27 22 25   GLUCOSE 330* 144* 107* 97 131*  BUN 25* 24* 12 11 16   CREATININE 1.15* 0.88 0.73 0.64 0.66  CALCIUM 8.8* 8.6* 8.6* 8.8* 9.0  MG  --   --  1.8  --  1.9   Liver Function Tests: Recent Labs  Lab 08/19/18 0050  AST 45*  ALT 38  ALKPHOS 122  BILITOT 0.6  PROT 6.7  ALBUMIN 3.7   HbA1C: No results for input(s): HGBA1C in the last 72 hours. CBG: Recent Labs  Lab 08/21/18 1629 08/21/18 2006 08/21/18 2332 08/22/18 0400 08/22/18 0854  GLUCAP 97 159* 137* 128* 163*    Recent Results (from the past 240 hour(s))  Blood Culture (routine x 2)     Status: None (Preliminary result)   Collection Time: 08/19/18  1:30 AM  Result Value Ref Range Status   Specimen Description BLOOD LEFT UPPER ARM  Final   Special Requests   Final    BOTTLES DRAWN AEROBIC AND ANAEROBIC Blood Culture adequate volume   Culture   Final    NO GROWTH 3 DAYS Performed at [facility], [address] [place], [place] [address]    Report Status PENDING  Incomplete  Blood Culture (routine x 2)     Status: None (Preliminary result)   Collection Time: 08/19/18  1:35 AM  Result Value Ref Range Status   Specimen Description BLOOD LEFT WRIST  Final   Special Requests   Final    BOTTLES DRAWN AEROBIC AND ANAEROBIC Blood Culture results may not be optimal due to an inadequate volume of blood received in culture bottles   Culture   Final    NO GROWTH 3 DAYS Performed at [facility], [place] [address], [place], [place] [address]    Report Status PENDING  Incomplete  MRSA PCR Screening     Status: None   Collection Time: 08/19/18  4:07 AM  Result Value Ref Range Status   MRSA by PCR NEGATIVE NEGATIVE  Final    Comment:        The GeneXpert MRSA Assay (FDA approved for NASAL specimens only), is one component of a comprehensive MRSA colonization surveillance program. It is not intended to diagnose MRSA infection nor to guide or monitor treatment for MRSA infections. Performed at [facility], [place] [address], [place], [place] [address]   Respiratory Panel by PCR     Status: None   Collection Time: 08/20/18  7:52 AM  Result Value Ref Range Status   Adenovirus NOT DETECTED NOT DETECTED Final   Coronavirus 229E NOT DETECTED NOT DETECTED Final   Coronavirus HKU1 NOT DETECTED NOT DETECTED Final   Coronavirus NL63 NOT DETECTED NOT DETECTED Final   Coronavirus OC43 NOT DETECTED NOT DETECTED Final   Metapneumovirus NOT DETECTED NOT DETECTED Final   Rhinovirus / Enterovirus NOT DETECTED NOT DETECTED Final   Influenza A NOT DETECTED NOT DETECTED Final   Influenza B NOT DETECTED NOT DETECTED Final   Parainfluenza Virus 1 NOT DETECTED NOT DETECTED Final   Parainfluenza Virus 2 NOT DETECTED NOT DETECTED Final   Parainfluenza Virus 3 NOT DETECTED NOT DETECTED Final   Parainfluenza Virus 4 NOT DETECTED NOT DETECTED Final   Respiratory Syncytial Virus NOT DETECTED NOT DETECTED Final   Bordetella pertussis NOT DETECTED NOT DETECTED Final   Chlamydophila pneumoniae NOT DETECTED NOT DETECTED Final   Mycoplasma pneumoniae NOT DETECTED NOT DETECTED Final    Comment: Performed at [facility], [place] [address], [place],  [address]         Radiology Studies: Dg Chest Port 1 View  Result Date: 08/22/2018 CLINICAL DATA:  72 year old with shortness of breath. History of breast cancer. EXAM: PORTABLE CHEST 1 VIEW COMPARISON:  Radiographs 08/19/2018 and 08/02/2018.  CT 08/02/2018. FINDINGS: 0841 hours. The heart size and mediastinal contours are stable. There is aortic atherosclerosis. Moderate bilateral pleural effusions are similar to the most recent prior study. Underlying  paramediastinal radiation changes bilaterally are stable. Compared with the most recent study, there is improved aeration of the upper lobes, likely due to resolving edema. Basilar pulmonary opacities are unchanged, probably atelectasis. No evidence of pneumothorax or acute osseous abnormality. IMPRESSION: Probable improvement in pulmonary edema compared with prior study of 3 days ago. Underlying bilateral pleural effusions and bibasilar pulmonary opacities are largely chronic. Electronically Signed   By: Richardean Sale M.D.   On: 08/22/2018 09:18        Scheduled Meds: . amLODipine  10 mg Oral Daily  . atorvastatin  10 mg Oral q1800  . B-complex with vitamin C  1 tablet Oral Daily  .  calcium carbonate  1,250 mg Oral QHS  . carvedilol  12.5 mg Oral BID WC  . cholecalciferol  2,000 Units Oral Q lunch  . dextromethorphan-guaiFENesin  1 tablet Oral BID  . insulin aspart  0-9 Units Subcutaneous Q4H  . levothyroxine  100 mcg Oral QAC breakfast  . methylPREDNISolone (SOLU-MEDROL) injection  60 mg Intravenous Q12H  . multivitamin with minerals  1 tablet Oral Daily  . polyethylene glycol  17 g Oral Daily  . rivaroxaban  20 mg Oral Q supper  . sodium chloride flush  3 mL Intravenous Q12H  . sodium chloride flush  3 mL Intravenous Q12H   Continuous Infusions: . sodium chloride 10 mL (08/22/18 1105)  . ceFEPime (MAXIPIME) IV 1 g (08/22/18 0913)     LOS: 3 days     Vernell Leep, MD, FACP, [facility]. [facility] Pager [phone]  If 7PM-7AM, please contact night-coverage [email or web address] Password [email or web address] 08/22/2018, 11:30 AM

## 2018-08-22 NOTE — Evaluation (Signed)
Physical Therapy Evaluation Patient Details Name: [name] MRN: [id number] DOB: October 11, 1946 Today's Date: 08/22/2018   History of Present Illness  Pt is a 72 y.o. F with significant PMH of paroxysmal atrial fibrillation on Xarelto, chronic diastolic CHF, hypothyroidism, and recent admission for pneumonia d/c 8/16 who now presents with worsened cough and has become progressively dyspneic. Admitted with acute hypoxic respiratory failure.   Clinical Impression  Pt admitted with above. Pt with report "i'm feeling much better today." Pt tolerated 100' of ambulation this date. Pt with report "I can't believe how weak my legs are" Pt unsteady requiring bilat HHA for safe ambulation, would benefit from RA. 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 Acute PT to cont to follow to progress mobility.    Follow Up Recommendations No PT follow up;Supervision/Assistance - 24 hour(initially for a few days)    Equipment Recommendations  Rolling walker with 5" wheels    Recommendations for Other Services       Precautions / Restrictions Precautions Precautions: Fall Restrictions Weight Bearing Restrictions: No      Mobility  Bed Mobility Overal bed mobility: Needs Assistance Bed Mobility: Supine to Sit     Supine to sit: Supervision     General bed mobility comments: HOB elevated, no difficulty  Transfers Overall transfer level: Needs assistance Equipment used: None Transfers: Sit to/from Stand Sit to Stand: Min assist         General transfer comment: pt unsteady upon initial stand with posterior lean and report "my legs are weaker than I thought  Ambulation/Gait Ambulation/Gait assistance: Min assist;+2 physical assistance;+2 safety/equipment   Assistive device: 2 person hand held assist Gait Pattern/deviations: Step-through pattern;Decreased stride length Gait velocity: slow Gait velocity  interpretation: <1.31 ft/sec, indicative of household ambulator General Gait Details: pt with kyphosis and forward head, pt aware and makes effort to stand errect. pt with generalized weakness requiring freq standing rest breaks, pt unsteady and would benefit from RW at this time. pt with c/o "I can't believe my legs are this weak"  Stairs            Wheelchair Mobility    Modified Rankin (Stroke Patients Only)       Balance Overall balance assessment: Needs assistance Sitting-balance support: Feet supported;No upper extremity supported Sitting balance-Leahy Scale: Good     Standing balance support: No upper extremity supported Standing balance-Leahy Scale: Fair Standing balance comment: pt unsteady with ant/post sway                             Pertinent Vitals/Pain Pain Assessment: No/denies pain    Home Living Family/patient expects to be discharged to:: Private residence Living Arrangements: Alone Available Help at Discharge: Family;Available PRN/intermittently Type of Home: House Home Access: Level entry     Home Layout: One level Home Equipment: Walker - 2 wheels;Bedside commode;Shower seat      Prior Function           Comments: Patient enjoys walking her dog      Hand Dominance   Dominant Hand: Left    Extremity/Trunk Assessment   Upper Extremity Assessment Upper Extremity Assessment: Generalized weakness(pt with L UE edema)    Lower Extremity Assessment Lower Extremity Assessment: Generalized weakness    Cervical / Trunk Assessment Cervical / Trunk Assessment: Kyphotic  Communication   Communication: No difficulties  Cognition Arousal/Alertness: Awake/alert Behavior During Therapy: Anxious(regarding "I'm so weak") Overall Cognitive Status: Within Functional Limits for tasks assessed                                        General Comments General comments (skin integrity, edema, etc.): pt assisted to the  bathroom, minA for transfer off commode due to low surface height, pt indep with hygiene, pt wiht noted L UE edema    Exercises     Assessment/Plan    PT Assessment Patient needs continued PT services  PT Problem List Decreased strength;Decreased range of motion;Decreased activity tolerance;Decreased balance;Decreased mobility;Decreased coordination;Decreased cognition;Decreased knowledge of use of DME;Decreased safety awareness       PT Treatment Interventions DME instruction;Gait training;Stair training;Functional mobility training;Therapeutic activities;Therapeutic exercise;Balance training;Neuromuscular re-education    PT Goals (Current goals can be found in the Care Plan section)  Acute Rehab PT Goals Patient Stated Goal: get better PT Goal Formulation: With patient Time For Goal Achievement: 09/05/18 Potential to Achieve Goals: Good    Frequency Min 3X/week   Barriers to discharge        Co-evaluation               AM-PAC PT "6 Clicks" Daily Activity  Outcome Measure Difficulty turning over in bed (including adjusting bedclothes, sheets and blankets)?: None Difficulty moving from lying on back to sitting on the side of the bed? : None Difficulty sitting down on and standing up from a chair with arms (e.g., wheelchair, bedside commode, etc,.)?: A Little Help needed moving to and from a bed to chair (including a wheelchair)?: A Little Help needed walking in hospital room?: A Little Help needed climbing 3-5 steps with a railing? : A Lot 6 Click Score: 19    End of Session Equipment Utilized During Treatment: Gait belt Activity Tolerance: Patient limited by fatigue Patient left: in chair;with call bell/phone within reach;with nursing/sitter in room Nurse Communication: Mobility status PT Visit Diagnosis: Unsteadiness on feet (R26.81)    Time: 2637-8588 PT Time Calculation (min) (ACUTE ONLY): 39 min   Charges:   PT Evaluation $PT Eval Moderate Complexity: 1  Mod PT Treatments $Gait Training: 8-22 mins $Therapeutic Activity: 8-22 mins        Kittie Plater, PT, DPT Acute Rehabilitation Services Pager #: [phone] Office #: [phone]   Berline Lopes 08/22/2018, 1:03 PM

## 2018-08-22 NOTE — Patient Outreach (Signed)
[facility]) Care Management  08/22/2018  ZURISADAI HELMINIAK Jan 10, 1946 [id number]  EMMI: pneumonia red alert Referral date: 08/17/18 Referral reason: feeling better overall: no Insurance: Medicare Day # 9 Attempt #1   Telephone call to patient regarding referral. Unable to reach patient. HIPAA compliant voice message left with call back phone number.   PLAN: RNCM will attempt 2nd telephone call to patient within 4 business days. RNCM will send outreach letter.   Quinn Plowman RN,BSN,CCM [facility] Telephonic  [phone]

## 2018-08-23 DIAGNOSIS — I5031 Acute diastolic (congestive) heart failure: Secondary | ICD-10-CM

## 2018-08-23 LAB — GLUCOSE, CAPILLARY
GLUCOSE-CAPILLARY: 151 mg/dL — AB (ref 70–99)
Glucose-Capillary: 146 mg/dL — ABNORMAL HIGH (ref 70–99)
Glucose-Capillary: 148 mg/dL — ABNORMAL HIGH (ref 70–99)
Glucose-Capillary: 154 mg/dL — ABNORMAL HIGH (ref 70–99)
Glucose-Capillary: 167 mg/dL — ABNORMAL HIGH (ref 70–99)
Glucose-Capillary: 175 mg/dL — ABNORMAL HIGH (ref 70–99)

## 2018-08-23 LAB — BASIC METABOLIC PANEL
Anion gap: 12 (ref 5–15)
BUN: 29 mg/dL — AB (ref 8–23)
CALCIUM: 8.9 mg/dL (ref 8.9–10.3)
CHLORIDE: 105 mmol/L (ref 98–111)
CO2: 24 mmol/L (ref 22–32)
CREATININE: 0.84 mg/dL (ref 0.44–1.00)
Glucose, Bld: 147 mg/dL — ABNORMAL HIGH (ref 70–99)
Potassium: 4.2 mmol/L (ref 3.5–5.1)
SODIUM: 141 mmol/L (ref 135–145)

## 2018-08-23 MED ORDER — METHYLPREDNISOLONE SODIUM SUCC 40 MG IJ SOLR: 40.0000 mg | Freq: Every day | INTRAMUSCULAR | Status: DC

## 2018-08-23 MED ORDER — FUROSEMIDE 10 MG/ML IJ SOLN
40.0000 mg | Freq: Two times a day (BID) | INTRAMUSCULAR | Status: AC
  Administered 2018-08-23: 40 mg via INTRAVENOUS
  Filled 2018-08-23: qty 4

## 2018-08-23 MED ORDER — FUROSEMIDE 10 MG/ML IJ SOLN
40.0000 mg | Freq: Two times a day (BID) | INTRAMUSCULAR | Status: DC
  Administered 2018-08-23: 40 mg via INTRAVENOUS
  Filled 2018-08-23: qty 4

## 2018-08-23 NOTE — Progress Notes (Signed)
PROGRESS NOTE   [name]  IRS:[id number]    DOB: 05/10/1946    DOA: 08/19/2018  PCP: [name], MD   I have briefly reviewed patients previous medical records in [facility].  Brief Narrative:  72 year old female, lives alone, independent of activities, PMH of right breast cancer status post XRT and chemotherapy, lymphoma status post XRT (Dr. Sonny Dandy), bilateral carotid stenosis, HTN, hypothyroid, LBBB, PAF status post ablation and on Xarelto, chronic diastolic CHF, recent hospitalization 08/02/2018-08/03/2018 secondary to acute hypoxic respiratory failure due to suspected aspiration pneumonia, completed course of Augmentin at home, presented again with recurrent nonproductive cough, progressive dyspnea, lethargy and weakness.  Hypoxic with oxygen saturation 50% on room air by EMS.  Admitted to stepdown unit for suspected HCAP, acute hypoxic respiratory failure when she briefly required BiPAP, now off.  Possibly accompanied by acute on chronic diastolic CHF.  Slowly improving.   Assessment & Plan:   Principal Problem:   HCAP (healthcare-associated pneumonia) Active Problems:   Hypothyroidism   Chronic diastolic CHF (congestive heart failure) (HCC)   Carotid artery disease (HCC)   PAF (paroxysmal atrial fibrillation) (HCC)   Acute respiratory failure with hypoxia (HCC)   Renal insufficiency   Acute hypoxic respiratory failure: Not on home oxygen PTA.  Secondary to HCAP and acute on chronic diastolic CHF (possibly more CHF than pneumonia).  Treat underlying causes as below and attempt to wean off of oxygen.  Currently on 2 L/min nasal cannula and saturating in the low 90s.  Not much clinical bronchospasm, reduce IV Solu-Medrol further and consider stopping in a.m.  Suspected healthcare associated pneumonia: Recently completed a course of IV antibiotics and then oral Augmentin for aspiration pneumonia.  Blood cultures x2: Negative.  MRSA PCR, RSV panel, urine pneumococcal and  Legionella antigen: Negative.  Vancomycin discontinued.  Completed 5 days course of cefepime on 9/4.  Chest x-ray 9/4 personally reviewed, pulmonary edema better, bilateral pleural effusions and bibasilar pulmonary opacities reported as largely chronic.  Procalcitonin 0.22 > 0.36 > 0.11.  Follow-up chest x-ray 9/6.  Acute on chronic diastolic CHF: TTE 0/08/3817: LVEF 55-60% and grade 2 diastolic dysfunction.  Strict intake output, daily weights.  BNP on admission 375.  After IV Lasix 40 mg x 1, -1.5 L yesterday with improvement in dyspnea.  Reports DOE this morning.  Will repeat IV Lasix 40 mg twice daily for today, follow clinically and check chest x-ray in a.m.  Paroxysmal atrial fibrillation: CHADS-VASc at least 3 (age, gender, CHF)  .  Continue carvedilol and Xarelto.  During recent admission, EP cardiology had seen and Tikosyn discontinued.  Status post ablation in the past and reportedly has seen Dr. Johnsie Cancel & Dr. Caryl Comes, Cardiology.  Patient mostly in sinus rhythm and occasional ST in the 100s.   Acute kidney injury, mild: Resolved.  Hyperkalemia: Resolved.  Essential hypertension: Discussed with RN and noted this morning's blood pressures of 212/79, likely inaccurate since repeat readings without much intervention done manually was 164/72.  Continue amlodipine and carvedilol.  Hyperglycemia: Likely due to stress response and steroids.  A1c 5.8.  Continue NovoLog sensitive SSI.  Hyperlipidemia: Continue statins.  Hypothyroid: Continue Synthroid.  ?  Prolonged QTC/chronic LBBB: Patient has chronic LBBB so not sure if the measurement is accurate.  EKG 08/22/2018: QTC 519 ms.  History of breast cancer status post right mastectomy, lymphoma: Closed with outpatient oncology.  Bone scan 8/29: No evidence of metastatic disease.   DVT prophylaxis: Currently on full dose anticoagulation with Xarelto.  Code Status: Full. Family Communication: None at bedside. Disposition: Slowly improving but still  not medically ready for discharge.  Transferred to telemetry 9/5.   Consultants:  None  Procedures:  BiPAP  Antimicrobials:  Vancomycin-discontinued. Cefepime-completed 9/4   Subjective: States that her dyspnea had improved yesterday afternoon and last night.  After ambulating to bathroom this morning developed some dyspnea on exertion.  No chest pain, dizziness or lightheadedness.  ROS: As above otherwise negative.  Objective:  Vitals:   08/23/18 0755 08/23/18 0801 08/23/18 0832 08/23/18 1029  BP: (!) 212/79 (!) 212/79 (!) 164/72 (!) 164/78  Pulse: (!) 102 (!) 102    Resp: (!) 22 (!) 23    Temp: 97.8 F (36.6 C) 97.8 F (36.6 C)    TempSrc: Oral Oral    SpO2: 94% 93%    Weight:        Examination:  General exam: Pleasant elderly female, small built and thinly nourished, lying comfortably propped up in bed without distress. Respiratory system: Clear to auscultation anteriorly.  Harsh breath sounds and few crackles posteriorly.  No wheezing or rhonchi. Respiratory effort normal. Cardiovascular system: S1 & S2 heard, RRR. No murmurs, rubs, gallops or clicks.?  Mild JVD.  No pedal edema.  Telemetry personally reviewed: Mostly sinus rhythm.  Occasional sinus tachycardia in the 100s. Gastrointestinal system: Abdomen is nondistended, soft and nontender. No organomegaly or masses felt. Normal bowel sounds heard.  Stable. Central nervous system: Alert and oriented. No focal neurological deficits.  Stable. Extremities: Symmetric 5 x 5 power. Skin: No rashes, lesions or ulcers Psychiatry: Judgement and insight appear normal. Mood & affect appropriate.     Data Reviewed: I have personally reviewed following labs and imaging studies  CBC: Recent Labs  Lab 08/19/18 0050 08/20/18 0306 08/21/18 0258 08/22/18 0233  WBC 14.2* 13.3* 14.3* 9.9  NEUTROABS 12.2*  --   --   --   HGB 14.0 12.0 12.7 12.5  HCT 45.2 37.8 40.9 39.8  MCV 96.2 94.5 96.2 95.0  PLT 332 286 268 841    Basic Metabolic Panel: Recent Labs  Lab 08/19/18 0741 08/20/18 0306 08/21/18 0258 08/22/18 0233 08/23/18 0242  NA 141 140 137 138 141  K 5.6* 3.8 3.9 3.9 4.2  CL 109 108 103 104 105  CO2 24 27 22 25 24   GLUCOSE 144* 107* 97 131* 147*  BUN 24* 12 11 16  29*  CREATININE 0.88 0.73 0.64 0.66 0.84  CALCIUM 8.6* 8.6* 8.8* 9.0 8.9  MG  --  1.8  --  1.9  --    Liver Function Tests: Recent Labs  Lab 08/19/18 0050  AST 45*  ALT 38  ALKPHOS 122  BILITOT 0.6  PROT 6.7  ALBUMIN 3.7   HbA1C: Recent Labs    08/22/18 0233  HGBA1C 5.8*   CBG: Recent Labs  Lab 08/22/18 2009 08/23/18 0014 08/23/18 0429 08/23/18 0800 08/23/18 1217  GLUCAP 159* 148* 175* 154* 151*    Recent Results (from the past 240 hour(s))  Blood Culture (routine x 2)     Status: None (Preliminary result)   Collection Time: 08/19/18  1:30 AM  Result Value Ref Range Status   Specimen Description BLOOD LEFT UPPER ARM  Final   Special Requests   Final    BOTTLES DRAWN AEROBIC AND ANAEROBIC Blood Culture adequate volume   Culture   Final    NO GROWTH 4 DAYS Performed at [facility], [address] [place], [place] [address]  Report Status PENDING  Incomplete  Blood Culture (routine x 2)     Status: None (Preliminary result)   Collection Time: 08/19/18  1:35 AM  Result Value Ref Range Status   Specimen Description BLOOD LEFT WRIST  Final   Special Requests   Final    BOTTLES DRAWN AEROBIC AND ANAEROBIC Blood Culture results may not be optimal due to an inadequate volume of blood received in culture bottles   Culture   Final    NO GROWTH 4 DAYS Performed at [facility], [place] [address] [place], [place] [address]    Report Status PENDING  Incomplete  MRSA PCR Screening     Status: None   Collection Time: 08/19/18  4:07 AM  Result Value Ref Range Status   MRSA by PCR NEGATIVE NEGATIVE Final    Comment:        The GeneXpert MRSA Assay (FDA approved for NASAL specimens only), is one  component of a comprehensive MRSA colonization surveillance program. It is not intended to diagnose MRSA infection nor to guide or monitor treatment for MRSA infections. Performed at [facility], [place] [address], [place], [place] [address]   Respiratory Panel by PCR     Status: None   Collection Time: 08/20/18  7:52 AM  Result Value Ref Range Status   Adenovirus NOT DETECTED NOT DETECTED Final   Coronavirus 229E NOT DETECTED NOT DETECTED Final   Coronavirus HKU1 NOT DETECTED NOT DETECTED Final   Coronavirus NL63 NOT DETECTED NOT DETECTED Final   Coronavirus OC43 NOT DETECTED NOT DETECTED Final   Metapneumovirus NOT DETECTED NOT DETECTED Final   Rhinovirus / Enterovirus NOT DETECTED NOT DETECTED Final   Influenza A NOT DETECTED NOT DETECTED Final   Influenza B NOT DETECTED NOT DETECTED Final   Parainfluenza Virus 1 NOT DETECTED NOT DETECTED Final   Parainfluenza Virus 2 NOT DETECTED NOT DETECTED Final   Parainfluenza Virus 3 NOT DETECTED NOT DETECTED Final   Parainfluenza Virus 4 NOT DETECTED NOT DETECTED Final   Respiratory Syncytial Virus NOT DETECTED NOT DETECTED Final   Bordetella pertussis NOT DETECTED NOT DETECTED Final   Chlamydophila pneumoniae NOT DETECTED NOT DETECTED Final   Mycoplasma pneumoniae NOT DETECTED NOT DETECTED Final    Comment: Performed at [facility], [place] [address] [place],  [address]         Radiology Studies: Dg Chest Port 1 View  Result Date: 08/22/2018 CLINICAL DATA:  72 year old with shortness of breath. History of breast cancer. EXAM: PORTABLE CHEST 1 VIEW COMPARISON:  Radiographs 08/19/2018 and 08/02/2018.  CT 08/02/2018. FINDINGS: 0841 hours. The heart size and mediastinal contours are stable. There is aortic atherosclerosis. Moderate bilateral pleural effusions are similar to the most recent prior study. Underlying paramediastinal radiation changes bilaterally are stable. Compared with the most recent study, there is  improved aeration of the upper lobes, likely due to resolving edema. Basilar pulmonary opacities are unchanged, probably atelectasis. No evidence of pneumothorax or acute osseous abnormality. IMPRESSION: Probable improvement in pulmonary edema compared with prior study of 3 days ago. Underlying bilateral pleural effusions and bibasilar pulmonary opacities are largely chronic. Electronically Signed   By: Richardean Sale M.D.   On: 08/22/2018 09:18        Scheduled Meds: . amLODipine  10 mg Oral Daily  . atorvastatin  10 mg Oral q1800  . B-complex with vitamin C  1 tablet Oral Daily  . calcium carbonate  1,250 mg Oral QHS  .  carvedilol  12.5 mg Oral BID WC  . cholecalciferol  2,000 Units Oral Q lunch  . dextromethorphan-guaiFENesin  1 tablet Oral BID  . furosemide  40 mg Intravenous BID  . insulin aspart  0-9 Units Subcutaneous TID WC  . levothyroxine  100 mcg Oral QAC breakfast  . [START ON 08/24/2018] methylPREDNISolone (SOLU-MEDROL) injection  40 mg Intravenous Daily  . multivitamin with minerals  1 tablet Oral Daily  . polyethylene glycol  17 g Oral Daily  . rivaroxaban  20 mg Oral Q supper  . sodium chloride flush  3 mL Intravenous Q12H  . sodium chloride flush  3 mL Intravenous Q12H   Continuous Infusions: . sodium chloride 10 mL/hr at 08/22/18 1600     LOS: 4 days     Vernell Leep, MD, [place], [facility]. [facility] Pager [phone]  If 7PM-7AM, please contact night-coverage [email or web address] Password [email or web address] 08/23/2018, 2:51 PM

## 2018-08-23 NOTE — Progress Notes (Signed)
Patient said that she had a little trouble with her breathing earlier today, but she had a breathing treatment and it got better.  Plan to pull bipap out of room later, patient has not had to use it for several days.

## 2018-08-24 ENCOUNTER — Inpatient Hospital Stay (HOSPITAL_COMMUNITY): Payer: Medicare Other

## 2018-08-24 ENCOUNTER — Other Ambulatory Visit: Payer: Self-pay

## 2018-08-24 LAB — CULTURE, BLOOD (ROUTINE X 2)
Culture: NO GROWTH
Culture: NO GROWTH
SPECIAL REQUESTS: ADEQUATE

## 2018-08-24 LAB — GLUCOSE, CAPILLARY
GLUCOSE-CAPILLARY: 99 mg/dL (ref 70–99)
Glucose-Capillary: 109 mg/dL — ABNORMAL HIGH (ref 70–99)
Glucose-Capillary: 119 mg/dL — ABNORMAL HIGH (ref 70–99)

## 2018-08-24 LAB — BASIC METABOLIC PANEL
Anion gap: 12 (ref 5–15)
BUN: 33 mg/dL — AB (ref 8–23)
CHLORIDE: 97 mmol/L — AB (ref 98–111)
CO2: 31 mmol/L (ref 22–32)
Calcium: 9.2 mg/dL (ref 8.9–10.3)
Creatinine, Ser: 0.81 mg/dL (ref 0.44–1.00)
GFR calc Af Amer: >60 mL/min (ref >60)
GFR calc non Af Amer: >60 mL/min (ref >60)
GLUCOSE: 128 mg/dL — AB (ref 70–99)
POTASSIUM: 3.6 mmol/L (ref 3.5–5.1)
Sodium: 140 mmol/L (ref 135–145)

## 2018-08-24 MED ORDER — FUROSEMIDE 40 MG PO TABS: 40.0000 mg | ORAL_TABLET | Freq: Every day | ORAL | 0 refills | Status: DC

## 2018-08-24 MED ORDER — HYDRALAZINE HCL 25 MG PO TABS: 25.0000 mg | ORAL_TABLET | Freq: Three times a day (TID) | ORAL | 0 refills | Status: DC

## 2018-08-24 MED ORDER — AMLODIPINE BESYLATE 10 MG PO TABS: 10.0000 mg | ORAL_TABLET | Freq: Every day | ORAL | 0 refills | Status: DC

## 2018-08-24 MED ORDER — HYDRALAZINE HCL 50 MG PO TABS
25.0000 mg | ORAL_TABLET | Freq: Three times a day (TID) | ORAL | Status: DC
  Administered 2018-08-24: 25 mg via ORAL
  Filled 2018-08-24: qty 1

## 2018-08-24 MED ORDER — HYDRALAZINE HCL 20 MG/ML IJ SOLN
INTRAMUSCULAR | Status: AC
  Filled 2018-08-24: qty 1

## 2018-08-24 NOTE — Care Management Note (Addendum)
Case Management Note  Patient Details  Name: [name] MRN: [id number] Date of Birth: [birth year]  Subjective/Objective:     Pt admitted with PNA   - pt has been admitted for this prior            Action/Plan:    PTA independent from home alone.  Pt is active with her church and informed CM that they are available for support when needed.  Pt has PCP and denied barriers with obtaining/paying for medications.  Pt is interested in HH/DME - agency choice given- pt chose [facility] - agency contacted and referrals accepted.  Pt will have 24 hour supervision until Sunday am - [facility] PT made aware   Expected Discharge Date:  08/24/18               Expected Discharge Plan:  [name]  In-House Referral:     Discharge planning Services  CM Consult  Post Acute Care Choice:    Choice offered to:  Patient  DME Arranged:  Oxygen DME Agency:  [place]:  PT, RN [facility] Agency:  [place]  Status of Service:  Completed, signed off  If discussed at [place] of Stay Meetings, dates discussed:    Additional Comments:  Patricia Labrador, RN 08/24/2018, 3:55 PM

## 2018-08-24 NOTE — Progress Notes (Signed)
Physical Therapy Treatment Patient Details Name: [name] MRN: [id number] DOB: 1946-08-13 Today's Date: 08/24/2018    History of Present Illness Pt is a 72 y.o. F with significant PMH of paroxysmal atrial fibrillation on Xarelto, chronic diastolic CHF, hypothyroidism, and recent admission for pneumonia d/c 8/16 who now presents with worsened cough and has become progressively dyspneic. Admitted with acute hypoxic respiratory failure.     PT Comments    Patient progressing with therapy today, now mod I with bed mobility and transfers, supervision level for gait with use of RW. Pt c/o of feeling weak however no overt LOB or buckle in legs. Discussed importance of energy conservation and recondotioning with support of HHPT. Performed O2 qual see below. At this time would recommend home O2. Discussed with patient and family safety considerations for home and feel she will do well with ongoing PT to improve activity tolerance. Walked 120' without hands on assistance today.    SATURATION QUALIFICATIONS: (This note is used to comply with regulatory documentation for home oxygen)  Patient Saturations on Room Air at Rest = 88%  Patient Saturations on Room Air while Ambulating = 85%  Patient Saturations on 1.5 Liters of oxygen while Ambulating = 94%  Please briefly explain why patient needs home oxygen: Pt desats on RA at rest and with activity.     Follow Up Recommendations  Home health PT;Supervision for mobility/OOB     Equipment Recommendations  Other (comment)(Patient alreayd has RW at home )    Recommendations for Other Services       Precautions / Restrictions Precautions Precautions: Fall Restrictions Weight Bearing Restrictions: No    Mobility  Bed Mobility Overal bed mobility: Needs Assistance Bed Mobility: Supine to Sit     Supine to sit: Supervision     General bed mobility comments: HOB elevated, no difficulty  Transfers Overall transfer level: Needs  assistance Equipment used: None Transfers: Sit to/from Stand Sit to Stand: Supervision            Ambulation/Gait Ambulation/Gait assistance: Min guard;Supervision Gait Distance (Feet): 120 Feet Assistive device: Rolling walker (2 wheeled);None Gait Pattern/deviations: Step-to pattern;Step-through pattern Gait velocity: slow   General Gait Details: pt walking with S with RW. c/o of leg weakness but no overt LOB or buckeling    Stairs             Wheelchair Mobility    Modified Rankin (Stroke Patients Only)       Balance Overall balance assessment: Needs assistance Sitting-balance support: Feet supported;No upper extremity supported Sitting balance-Leahy Scale: Good     Standing balance support: No upper extremity supported Standing balance-Leahy Scale: Fair Standing balance comment: pt unsteady with ant/post sway                            Cognition Arousal/Alertness: Awake/alert Behavior During Therapy: Anxious Overall Cognitive Status: Within Functional Limits for tasks assessed                                        Exercises      General Comments General comments (skin integrity, edema, etc.): Extensive discussion over safety at home and use of home O2, Pulse Ox, role of HHPT.       Pertinent Vitals/Pain Pain Assessment: No/denies pain    Home Living  Prior Function            PT Goals (current goals can now be found in the care plan section) Acute Rehab PT Goals Patient Stated Goal: get better PT Goal Formulation: With patient Time For Goal Achievement: 09/05/18 Potential to Achieve Goals: Good Progress towards PT goals: Progressing toward goals    Frequency    Min 3X/week      PT Plan Current plan remains appropriate    Co-evaluation              AM-PAC PT "6 Clicks" Daily Activity  Outcome Measure  Difficulty turning over in bed (including adjusting  bedclothes, sheets and blankets)?: None Difficulty moving from lying on back to sitting on the side of the bed? : None Difficulty sitting down on and standing up from a chair with arms (e.g., wheelchair, bedside commode, etc,.)?: A Little Help needed moving to and from a bed to chair (including a wheelchair)?: A Little Help needed walking in hospital room?: A Little Help needed climbing 3-5 steps with a railing? : A Lot 6 Click Score: 19    End of Session Equipment Utilized During Treatment: Gait belt;Oxygen Activity Tolerance: Patient limited by fatigue Patient left: in chair;with call bell/phone within reach;with nursing/sitter in room Nurse Communication: Mobility status PT Visit Diagnosis: Unsteadiness on feet (R26.81)     Time: 0930-1030 PT Time Calculation (min) (ACUTE ONLY): 60 min  Charges:  $Gait Training: 8-22 mins $Therapeutic Activity: 8-22 mins $Self Care/Home Management: 8-22                     Reinaldo Berber, PT, DPT Acute Rehabilitation Services Pager: [phone] Office: [phone]     Reinaldo Berber 08/24/2018, 10:53 AM

## 2018-08-24 NOTE — Discharge Instructions (Signed)

## 2018-08-24 NOTE — Progress Notes (Signed)
Pt/family educated on d/c instructions; belongings collected; IV removed; home oxygen in room; will wheel out once ready.   [place]  Steven Veazie, RN

## 2018-08-24 NOTE — Discharge Summary (Signed)
Physician Discharge Summary  [name] JKK:[id number] DOB: [birth year]  PCP: [name], MD  Admit date: 08/19/2018 Discharge date: 08/24/2018  Recommendations for Outpatient Follow-up:  1. Dr. [name], PCP in 1 week with repeat labs (CBC & BMP). 2. Dr. Jenkins Rouge, Cardiology in 1 week. 3. Recommend repeating chest x-ray in 4 weeks to insure resolution of abnormal findings (pneumonia, atelectasis and pleural effusions) and may consider repeating the chest x-ray earlier if needed.   Home Health: PT Equipment/Devices: Oxygen via nasal cannula at 1.5 L/min continuously.  Discharge Condition: Improved and stable. CODE STATUS: Full. Diet recommendation: Heart healthy diet.  Discharge Diagnoses:  Principal Problem:   HCAP (healthcare-associated pneumonia) Active Problems:   Hypothyroidism   Chronic diastolic CHF (congestive heart failure) (HCC)   Carotid artery disease (HCC)   PAF (paroxysmal atrial fibrillation) (HCC)   Acute respiratory failure with hypoxia (HCC)   Renal insufficiency   Brief Summary: 72 year old female, lives alone, independent of activities, PMH of right breast cancer status post XRT and chemotherapy, lymphoma status post XRT (Dr. Sonny Dandy), bilateral carotid stenosis, HTN, hypothyroid, LBBB, PAF status post ablation and on Xarelto, chronic diastolic CHF, recent hospitalization 08/02/2018-08/03/2018 secondary to acute hypoxic respiratory failure due to suspected aspiration pneumonia, completed course of Augmentin at home, presented again with recurrent nonproductive cough, progressive dyspnea, lethargy and weakness.  Hypoxic with oxygen saturation 50% on room air by EMS.  Admitted to stepdown unit for suspected HCAP, acute hypoxic respiratory failure when she briefly required BiPAP, now off.  Possibly accompanied by acute on chronic diastolic CHF.     Assessment & Plan:   Acute hypoxic respiratory failure: Not on home oxygen PTA.  Secondary to HCAP  and acute on chronic diastolic CHF (possibly more CHF than pneumonia).    Treated treatment for pneumonia.  Briefly on IV Solu-Medrol but no clinical bronchospasm, tapered and discontinued.  Diuresed with IV Lasix for CHF with good improvement.  Dyspnea significantly improved and as per patient, breathing is close to baseline.  However still hypoxic as per home oxygen evaluation by PT.  Arranged home oxygen.  Reviewed chest x-ray from today with reading radiologist, interstitial edema has significantly improved compared to admission.  She has atelectasis and bilateral effusions which are also contributing to the hypoxia.  Hopefully this should improve with oral Lasix.  May need to consider further evaluation if she has recurrent worsening which may include a pulmonology consultation.   Suspected healthcare associated pneumonia: Recently completed a course of IV antibiotics and then oral Augmentin for aspiration pneumonia.  Blood cultures x2: Negative.  MRSA PCR, RSV panel, urine pneumococcal and Legionella antigen: Negative.  Vancomycin discontinued.  Completed 5 days course of cefepime on 9/4.  Chest x-ray 9/4 personally reviewed, pulmonary edema better, bilateral pleural effusions and bibasilar pulmonary opacities reported as largely chronic.  Procalcitonin 0.22 > 0.36 > 0.11.    Chest x-ray from 9/6 were reviewed along with previous ones with radiology, discussion as above.  Acute on chronic diastolic CHF: TTE 07/03/9677: LVEF 55-60% and grade 2 diastolic dysfunction.  Strict intake output, daily weights.  BNP on admission 375.  Diuresed with couple of days of IV Lasix with good effect.  -1.5 L since admission, made 3.3 L urine yesterday.  Weight down by about 6 pounds since 9/3.  Transitioned to oral Lasix 40 mg daily with close outpatient follow-up with PCP next week to determine continued need for Lasix and adjustment of dose if needed.  Also recommend outpatient  follow-up with primary cardiologist,  advised patient and family to call for appointment.  Paroxysmal atrial fibrillation: CHADS-VASc at least 3 (age, gender, CHF).  Continue carvedilol and Xarelto.  During recent admission, EP cardiology had seen and Tikosyn discontinued.  Status post ablation in the past and reportedly has seen Dr. Johnsie Cancel & Dr. Caryl Comes, Cardiology.    Remained in sinus rhythm.  Acute kidney injury, mild: Resolved.  Hyperkalemia: Resolved.  Essential hypertension:  Blood pressure readings are probably not fully accurate.  Unable to take blood pressures in right arm due to mastectomy on that side.  Patient apparently does not allow BP checks and left arm for unclear reasons.  Blood pressures have been checked in the lower extremity.  Intermittently uncontrolled as noted below.  Amlodipine initiated this admission.  Also started hydralazine today.  Close outpatient follow-up and adjust doses as needed.  Hyperglycemia: Likely due to stress response and steroids.  A1c 5.8.    Follow periodically as outpatient.  Hyperlipidemia: Continue statins.  Hypothyroid: Continue Synthroid.  ?  Prolonged QTC/chronic LBBB: Patient has chronic LBBB so not sure if the measurement is accurate.  EKG 08/22/2018: QTC 519 ms.  History of breast cancer status post right mastectomy, lymphoma: Follow-up outpatient with oncology.  Bone scan 8/29: No evidence of metastatic disease.  CT of chest 8/15 had shown some sclerotic bone lesions concerning for sclerotic metastatic lesions.  Can follow-up outpatient with oncology but recent bone scan negative.    Consultants:  None  Procedures:  BiPAP   Discharge Instructions  Discharge Instructions    (HEART FAILURE PATIENTS) Call MD:  Anytime you have any of the following symptoms: 1) 3 pound weight gain in 24 hours or 5 pounds in 1 week 2) shortness of breath, with or without a dry hacking cough 3) swelling in the hands, feet or stomach 4) if you have to sleep on extra pillows at  night in order to breathe.   Complete by:  As directed    Call MD for:  difficulty breathing, headache or visual disturbances   Complete by:  As directed    Call MD for:  extreme fatigue   Complete by:  As directed    Call MD for:  persistant dizziness or light-headedness   Complete by:  As directed    Call MD for:  persistant nausea and vomiting   Complete by:  As directed    Call MD for:  severe uncontrolled pain   Complete by:  As directed    Call MD for:  temperature >100.4   Complete by:  As directed    Diet - low sodium heart healthy   Complete by:  As directed    Increase activity slowly   Complete by:  As directed        Medication List    TAKE these medications   acetaminophen 325 MG tablet Commonly known as:  TYLENOL Take 650 mg by mouth at bedtime as needed for moderate pain.   albuterol 108 (90 Base) MCG/ACT inhaler Commonly known as:  PROVENTIL HFA;VENTOLIN HFA Inhale 2 puffs into the lungs every 6 (six) hours as needed for wheezing or shortness of breath.   amLODipine 10 MG tablet Commonly known as:  NORVASC Take 1 tablet (10 mg total) by mouth daily. Start taking on:  08/25/2018   atorvastatin 10 MG tablet Commonly known as:  LIPITOR Take 1 tablet (10 mg total) by mouth daily at 6 PM.   b complex vitamins capsule Take  1 capsule by mouth daily after breakfast.   calcium carbonate 600 MG Tabs tablet Commonly known as:  OS-CAL Take 600 mg by mouth at bedtime.   carvedilol 12.5 MG tablet Commonly known as:  COREG Take 1 tablet (12.5 mg total) by mouth 2 (two) times daily with a meal.   furosemide 40 MG tablet Commonly known as:  LASIX Take 1 tablet (40 mg total) by mouth daily.   hydrALAZINE 25 MG tablet Commonly known as:  APRESOLINE Take 1 tablet (25 mg total) by mouth 3 (three) times daily.   levothyroxine 100 MCG tablet Commonly known as:  SYNTHROID, LEVOTHROID Take 100 mcg by mouth daily at 6 (six) AM.   multivitamin tablet Take 1 tablet  by mouth daily after breakfast. "Natural Vitamin"   OVER THE COUNTER MEDICATION BDZ tablets: Take 1 tablet by mouth at bedtime   OVER THE COUNTER MEDICATION Vegetarian Omega Green: Take 3 capsules by mouth daily with lunch   Vitamin D 2000 units Caps Take 2,000 Units by mouth daily with lunch.   XARELTO 20 MG Tabs tablet Generic drug:  rivaroxaban TAKE 1 TABLET BY MOUTH DAILY WITH DINNER What changed:  See the new instructions.      Follow-up Information    [name], MD. Schedule an appointment as soon as possible for a visit in 1 week(s).   Specialty:  Family Medicine Why:  To be seen with repeat labs (CBC & BMP). Contact information: [address] [place] [address] [phone]        Constance Haw, MD .   Specialty:  Cardiology Contact information: [address] [place] [address] [phone]        Josue Hector, MD. Schedule an appointment as soon as possible for a visit in 1 week(s).   Specialty:  Cardiology Contact information: [address] [place] [address] [phone]          Allergies  Allergen Reactions  . Ace Inhibitors Cough  . Benazepril Cough  . Epinephrine Other (See Comments)    Feels "buzzed" and heart rate increases  . Iodinated Diagnostic Agents Other (See Comments)    Sneezing   . Potassium-Containing Compounds Nausea And Vomiting    Projectile vomiting--liquid potassium   . Prednisolone Other (See Comments)    Arrhythmia(s)  . Taxotere [Docetaxel] Other (See Comments)    Fluid overload      Procedures/Studies: Dg Chest 2 View  Result Date: 08/24/2018 CLINICAL DATA:  Hypoxia, nonproductive cough. Previous episodes of pneumonia and CHF. EXAM: CHEST - 2 VIEW COMPARISON:  Portable chest x-ray of August 22, 2018 FINDINGS: The lungs are adequately inflated. There is persistent increased density in the retrocardiac region on the left with further  obscuration of the hemidiaphragm. Density in the right infrahilar region is fairly stable. The heart is mildly enlarged. The pulmonary vascularity is not clearly engorged. There is calcification in the wall of the aortic arch. IMPRESSION: Moderate-sized bilateral pleural effusions. Increasing density in the retrocardiac region on the left may reflect atelectasis or pneumonia. No overt pulmonary edema. Thoracic aortic atherosclerosis. Electronically Signed   By: David  Martinique M.D.   On: 08/24/2018 09:41   Dg Chest Port 1 View  Result Date: 08/22/2018 CLINICAL DATA:  72 year old with shortness of breath. History of breast cancer. EXAM: PORTABLE CHEST 1 VIEW COMPARISON:  Radiographs 08/19/2018 and 08/02/2018.  CT 08/02/2018. FINDINGS: 0841 hours. The heart size and mediastinal contours are  stable. There is aortic atherosclerosis. Moderate bilateral pleural effusions are similar to the most recent prior study. Underlying paramediastinal radiation changes bilaterally are stable. Compared with the most recent study, there is improved aeration of the upper lobes, likely due to resolving edema. Basilar pulmonary opacities are unchanged, probably atelectasis. No evidence of pneumothorax or acute osseous abnormality. IMPRESSION: Probable improvement in pulmonary edema compared with prior study of 3 days ago. Underlying bilateral pleural effusions and bibasilar pulmonary opacities are largely chronic. Electronically Signed   By: Richardean Sale M.D.   On: 08/22/2018 09:18   Dg Chest Port 1 View  Result Date: 08/19/2018 CLINICAL DATA:  72 year old female with shortness of breath. Patient is on BiPAP. EXAM: PORTABLE CHEST 1 VIEW COMPARISON:  Chest CT dated 08/02/2018 FINDINGS: There bilateral pleural effusions with bilateral mid to lower lung field airspace densities which may represent atelectasis or infiltrate. No pneumothorax. Top-normal cardiac silhouette. Osteopenia. No acute osseous pathology. IMPRESSION: Bilateral  pleural effusions and bilateral mid to lower lung field atelectasis/infiltrate. Electronically Signed   By: Anner Crete M.D.   On: 08/19/2018 01:02      Subjective: "I feel good".  Reports that her dyspnea is much better.  Indicates that her breathing may be close to baseline.  No cough, chest pain, fever, chills, dizziness or lightheadedness reported.  As per RN, no acute issues noted.  Discharge Exam:  Vitals:   08/24/18 8144 08/24/18 1133 08/24/18 1152 08/24/18 1514  BP: (!) 156/36 (!) 188/74  (!) 151/72  Pulse: 85 80    Resp: 20 19    Temp:   97.9 F (36.6 C)   TempSrc:   Oral   SpO2: (!) 84% 95%    Weight:        General exam: Pleasant elderly female, small built and thinly nourished, lying comfortably propped up in bed without distress. Respiratory system: Clear to auscultation anteriorly.  Slightly diminished breath sounds in the bases.  Otherwise improved breath sounds posteriorly and clear to auscultation.  No wheezing, rhonchi.  Occasional basal crackles noted.  No increased work of breathing. Cardiovascular system: S1 & S2 heard, RRR. No murmurs, rubs, gallops or clicks.  No pedal edema.    Telemetry personally reviewed: Sinus rhythm. Gastrointestinal system: Abdomen is nondistended, soft and nontender. No organomegaly or masses felt. Normal bowel sounds heard.   Central nervous system: Alert and oriented. No focal neurological deficits.  Extremities: Symmetric 5 x 5 power. Skin: No rashes, lesions or ulcers Psychiatry: Judgement and insight appear normal. Mood & affect appropriate.     The results of significant diagnostics from this hospitalization (including imaging, microbiology, ancillary and laboratory) are listed below for reference.     Microbiology: Recent Results (from the past 240 hour(s))  Blood Culture (routine x 2)     Status: None   Collection Time: 08/19/18  1:30 AM  Result Value Ref Range Status   Specimen Description BLOOD LEFT UPPER ARM   Final   Special Requests   Final    BOTTLES DRAWN AEROBIC AND ANAEROBIC Blood Culture adequate volume   Culture   Final    NO GROWTH 5 DAYS Performed at [facility], [address], [place], [place] [address]    Report Status 08/24/2018 FINAL  Final  Blood Culture (routine x 2)     Status: None   Collection Time: 08/19/18  1:35 AM  Result Value Ref Range Status   Specimen Description BLOOD LEFT WRIST  Final   Special  Requests   Final    BOTTLES DRAWN AEROBIC AND ANAEROBIC Blood Culture results may not be optimal due to an inadequate volume of blood received in culture bottles   Culture   Final    NO GROWTH 5 DAYS Performed at [facility], [place] [address], [place], [place] [address]    Report Status 08/24/2018 FINAL  Final  MRSA PCR Screening     Status: None   Collection Time: 08/19/18  4:07 AM  Result Value Ref Range Status   MRSA by PCR NEGATIVE NEGATIVE Final    Comment:        The GeneXpert MRSA Assay (FDA approved for NASAL specimens only), is one component of a comprehensive MRSA colonization surveillance program. It is not intended to diagnose MRSA infection nor to guide or monitor treatment for MRSA infections. Performed at [facility], [place] [address], [place], [place] [address]   Respiratory Panel by PCR     Status: None   Collection Time: 08/20/18  7:52 AM  Result Value Ref Range Status   Adenovirus NOT DETECTED NOT DETECTED Final   Coronavirus 229E NOT DETECTED NOT DETECTED Final   Coronavirus HKU1 NOT DETECTED NOT DETECTED Final   Coronavirus NL63 NOT DETECTED NOT DETECTED Final   Coronavirus OC43 NOT DETECTED NOT DETECTED Final   Metapneumovirus NOT DETECTED NOT DETECTED Final   Rhinovirus / Enterovirus NOT DETECTED NOT DETECTED Final   Influenza A NOT DETECTED NOT DETECTED Final   Influenza B NOT DETECTED NOT DETECTED Final   Parainfluenza Virus 1 NOT DETECTED NOT DETECTED Final   Parainfluenza Virus 2 NOT DETECTED NOT DETECTED Final    Parainfluenza Virus 3 NOT DETECTED NOT DETECTED Final   Parainfluenza Virus 4 NOT DETECTED NOT DETECTED Final   Respiratory Syncytial Virus NOT DETECTED NOT DETECTED Final   Bordetella pertussis NOT DETECTED NOT DETECTED Final   Chlamydophila pneumoniae NOT DETECTED NOT DETECTED Final   Mycoplasma pneumoniae NOT DETECTED NOT DETECTED Final    Comment: Performed at [facility], [place] [address], [place], [place] [address]     Labs: CBC: Recent Labs  Lab 08/19/18 0050 08/20/18 0306 08/21/18 0258 08/22/18 0233  WBC 14.2* 13.3* 14.3* 9.9  NEUTROABS 12.2*  --   --   --   HGB 14.0 12.0 12.7 12.5  HCT 45.2 37.8 40.9 39.8  MCV 96.2 94.5 96.2 95.0  PLT 332 286 268 500   Basic Metabolic Panel: Recent Labs  Lab 08/20/18 0306 08/21/18 0258 08/22/18 0233 08/23/18 0242 08/24/18 0252  NA 140 137 138 141 140  K 3.8 3.9 3.9 4.2 3.6  CL 108 103 104 105 97*  CO2 27 22 25 24 31   GLUCOSE 107* 97 131* 147* 128*  BUN 12 11 16  29* 33*  CREATININE 0.73 0.64 0.66 0.84 0.81  CALCIUM 8.6* 8.8* 9.0 8.9 9.2  MG 1.8  --  1.9  --   --    Liver Function Tests: Recent Labs  Lab 08/19/18 0050  AST 45*  ALT 38  ALKPHOS 122  BILITOT 0.6  PROT 6.7  ALBUMIN 3.7   BNP (last 3 results) Recent Labs    06/16/18 0651 08/02/18 0257 08/19/18 0050  BNP 237.7* 443.6* 374.8*   CBG: Recent Labs  Lab 08/23/18 1217 08/23/18 1639 08/23/18 2020 08/24/18 0804 08/24/18 1153  GLUCAP 151* 146* 167* 109* 119*   Hgb A1c Recent Labs    08/22/18 0233  HGBA1C 5.8*   Urinalysis  Component Value Date/Time   COLORURINE YELLOW 08/19/2018 0301   APPEARANCEUR HAZY (A) 08/19/2018 0301   LABSPEC 1.015 08/19/2018 0301   PHURINE 6.0 08/19/2018 0301   GLUCOSEU 50 (A) 08/19/2018 0301   HGBUR NEGATIVE 08/19/2018 0301   BILIRUBINUR NEGATIVE 08/19/2018 0301   KETONESUR NEGATIVE 08/19/2018 0301   PROTEINUR 100 (A) 08/19/2018 0301   UROBILINOGEN 0.2 05/10/2013 0438   NITRITE NEGATIVE 08/19/2018  0301   LEUKOCYTESUR NEGATIVE 08/19/2018 0301    I discussed in detail separately with patient's son and then patient's daughter via phone.  Updated care and answered questions.  Patient and family indicate that they will be able to provide her with short-term 24/7 supervision and assistance.  Time coordinating discharge: 50 minutes  SIGNED:  Vernell Leep, MD, FACP, [facility]. [facility] Pager [phone]  If 7PM-7AM, please contact night-coverage [email or web address] Password [email or web address] 08/24/2018, 3:45 PM

## 2018-08-24 NOTE — Care Management Important Message (Signed)
Important Message  Patient Details  Name: [name] MRN: [id number] Date of Birth: 06/12/1946   Medicare Important Message Given:  Yes    [name] 08/24/2018, 3:57 PM

## 2018-08-24 NOTE — Consult Note (Signed)
   [facility] Inpatient Consult   08/24/2018  [name] [birth year] [id number]   Patient is currently active with [facility] for chronic disease management services.  Patient has had a call attempt by a [facility] RN Telephonic Nurse.  Will continue with EMMI Pneumonia calls.  Patient's primary care provider provides the transition of care call and follow up as well.  Of note, [facility] Care Management services does not replace or interfere with any services that are needed or arranged by inpatient case management or social work.  For additional questions or referrals please contact:   Natividad Brood, RN BSN [facility] Liaison  [phone] business mobile phone Toll free office [phone]

## 2018-08-27 ENCOUNTER — Other Ambulatory Visit: Payer: Self-pay

## 2018-08-28 ENCOUNTER — Other Ambulatory Visit: Payer: Self-pay

## 2018-08-28 ENCOUNTER — Ambulatory Visit: Payer: Self-pay

## 2018-08-28 NOTE — Patient Outreach (Signed)
[facility]) Care Management  08/28/2018  [name] 19-May-1946 [id number]  Transition of care  / EMM pneumonia red alert follow up Referral date: 08/24/18 Referral source:  Discharged from [facility] on 08/24/18 Insurance: Medicare  Telephone call to patient regarding transition of care referral and EMMI pneumonia follow up. HIPAA verified by patient.  Patient states she was admitted to the hospital because of congestive heart failure. Patient states her oxygen levels on oxygen have been 93- 95%.  She states her oxygen saturation on room air has been 92%.  Patient reports she feels she is getting better.  Patient reports her oxygen is are being provided [facility]. Patient states she was told while in the hospital she would have home health services.  Patient states she has not heart back from the home health agency regarding her start of care date.  Patient reports her blood pressure has running a little low. Patient states she has adjusted her blood pressure medication due to this. She reports her doctor is not aware of this. RNCM stressed importance of contact her doctor to report blood pressure reading and request advisement from physician.   Patient states her current weight is 131.6 lbs. Patient reports she is eating ok and states she currently has 24 hour in home care.  Patient states she did not receive heart failure education material in her discharge instructions.  RNCM informed patient she would send patient EMMI education material on heart failure and low sodium diet.  Patient states her goals are to be able to attend the chair yoga class she was involved and and that she will be able to return to driving.   PLAN; RNCM will follow up with patient within 1 week .  Quinn Plowman RN,BSN,CCM [facility] Telephonic  [phone]

## 2018-08-29 ENCOUNTER — Encounter: Payer: Self-pay | Admitting: Physician Assistant

## 2018-08-29 ENCOUNTER — Ambulatory Visit (INDEPENDENT_AMBULATORY_CARE_PROVIDER_SITE_OTHER): Payer: Medicare Other | Admitting: Physician Assistant

## 2018-08-29 VITALS — BP 102/62 | HR 86 | Ht 61.0 in | Wt 132.0 lb

## 2018-08-29 DIAGNOSIS — I5032 Chronic diastolic (congestive) heart failure: Secondary | ICD-10-CM | POA: Diagnosis not present

## 2018-08-29 DIAGNOSIS — I10 Essential (primary) hypertension: Secondary | ICD-10-CM | POA: Diagnosis not present

## 2018-08-29 DIAGNOSIS — I6523 Occlusion and stenosis of bilateral carotid arteries: Secondary | ICD-10-CM

## 2018-08-29 DIAGNOSIS — I428 Other cardiomyopathies: Secondary | ICD-10-CM

## 2018-08-29 DIAGNOSIS — I480 Paroxysmal atrial fibrillation: Secondary | ICD-10-CM

## 2018-08-29 DIAGNOSIS — I739 Peripheral vascular disease, unspecified: Secondary | ICD-10-CM

## 2018-08-29 DIAGNOSIS — I779 Disorder of arteries and arterioles, unspecified: Secondary | ICD-10-CM | POA: Diagnosis not present

## 2018-08-29 DIAGNOSIS — I493 Ventricular premature depolarization: Secondary | ICD-10-CM | POA: Diagnosis not present

## 2018-08-29 MED ORDER — AMLODIPINE BESYLATE 5 MG PO TABS: 5.0000 mg | ORAL_TABLET | Freq: Every day | ORAL | 3 refills | Status: DC

## 2018-08-29 NOTE — Patient Instructions (Signed)
Medication Instructions:  1. DECREASE NORVASC TO 5 MG DAILY; NEW RX HAS BEEN SENT IN  Labwork: TODAY BMET  Testing/Procedures: 1. Your physician has requested that you have an LIMITED echocardiogram. Echocardiography is a painless test that uses sound waves to create images of your heart. It provides your doctor with information about the size and shape of your heart and how well your heart's chambers and valves are working. This procedure takes approximately one hour. There are no restrictions for this procedure.  THIS IS TO BE DONE 2-3 WEEKS   2. Your physician has recommended that you wear a holter monitor. Holter monitors are medical devices that record the heart's electrical activity. Doctors most often use these monitors to diagnose arrhythmias. Arrhythmias are problems with the speed or rhythm of the heartbeat. The monitor is a small, portable device. You can wear one while you do your normal daily activities. This is usually used to diagnose what is causing palpitations/syncope (passing out). THIS IS TO BE DONE 2-3 WEEKS   Follow-Up: DR. Curt Bears IN 6 WEEKS   Any Other Special Instructions Will Be Listed Below (If Applicable). CALL IF YOUR SYSTOLIC BLOOD PRESSURE IS BELOW 110 AND YOU HOLD YOUR HYDRALAZINE  YOU HAVE BEEN GIVEN A HANDICAP PLACARD FORM    If you need a refill on your cardiac medications before your next appointment, please call your pharmacy.

## 2018-08-29 NOTE — Progress Notes (Signed)
Cardiology Office Note:    Date:  08/29/2018   ID:  [name], DOB 1946-10-27, MRN [id number]  PCP:  [name], MD  Cardiologist:  Jenkins Rouge, MD  Electrophysiologist:  Constance Haw, MD   Referring MD: [name], MD   Chief Complaint  Patient presents with  . Hospitalization Follow-up    CHF/pneumonia    History of Present Illness:    [name] is a 72 y.o. female with NICM with improved EF, diastolic HF, HTN, LBBB, hypothyroidism, Hodgkin's disease, breast CA, PAF, carotid artery disease. Patient underwent radiation therapy for Hodgkin's lymphoma in 1998, mastectomy for breast cancer in 2009 and recurrent breast CA in 2013. EF has been as low as 45% in the past. This has improved to normal over time. Flecainide was started in 12/13 for recurrent A. fib. ETT was negative for proarrhythmia. She was admitted in 5/14 with pulmonary edema in the setting of hypertensive crisis complicated by PEA arrest. She was switched to dofetilide.  She continued to have breakthrough atrial fibrillation ultimately underwent PVI ablation with Dr. Curt Bears in July 2018.  She has been admitted several times in the past few months with acute hypoxic respiratory failure.  She has been treated for pneumonia several times.  During her admission in August 2019, she was noted to have prolonged QT.  Her dofetilide was discontinued.  She was last seen in clinic by Chanetta Marshall, NP 08/17/2018.  She was maintaining normal sinus rhythm at that time.  She did have a lot of PVCs noted.    She was admitted 9/1-9/6 with recurrent acute hypoxic respiratory failure secondary to healthcare associated pneumonia as well as acute on chronic diastolic heart failure.  She was treated with antibiotics and Solu-Medrol.  She had excellent response to IV Lasix with good diuresis.  [name] returns for post hospital follow-up.  She is here today with her son.  Since discharge, her weight is decreased 4 pounds.   She has had several low blood pressure readings and has held her hydralazine.  Overall, she is feeling better.  Her breathing is improved.  She continues to sleep on incline but denies paroxysmal nocturnal dyspnea.  Her leg swelling has improved.  She still has some swelling later in the day but it is usually better in the morning.  She denies syncope or near syncope.  She does note occasional skips but denies rapid palpitations.  She denies any bleeding issues other than some mild epistaxis.  Prior CV studies:   The following studies were reviewed today:  Echocardiogram 05/03/2018 EF 55-60, normal wall motion, grade 2 diastolic dysfunction, mild MR, mild RAE  Holter monitor 01/29/2018 Minimum HR: 69 BPM at 11:24:10 AM Maximum HR: 115 BPM at 5:25:32 PM(2) Average HR: 84 BPM 1% PVCs, <1% APCs Sinus rhythm, and sinus tachycardia seen. 4 beats SVT seen.   Carotid US 12/15/2017  Final Interpretation: Right Carotid: There is evidence in the right ICA of a 1-39% stenosis. Left Carotid: There is evidence in the left ICA of a 40-59% stenosis. Vertebrals: Both vertebral arteries were patent with antegrade flow. Subclavians: Normal flow hemodynamics were seen in bilateral subclavian arteries.  Cardiac CT 06/23/17 IMPRESSION: 1) Normal PV;s with no anomaly 2) No LAA thrombus 3) Moderate LAE and mild RA enlargement 4) No ASD/PFO 5) No pericardial effusion 6) Esophagus courses closest to RLPV ostium  Echocardiogram 05/05/2017 Mild concentric LVH, EF 55-60, normal wall motion, grade 2 diastolic dysfunction, trivial AI, moderate MR,  mild LAE, mild TR, PASP 39  Nuclear stress test 05/05/2017 Low risk stress nuclear study with fixed septal defect likely related to LBBB; no ischemia; EF 58 with mild septal hypokinesis.  Event monitor 04/2017 NSR PAF documented multiple times Has seen Dr Curt Bears for ablation   Carotid US 6/17 Stable RICA velocities, now in 40-59% range. Stable LICA velocities,  now in 60-79% range of stenosis. F/U 6 mos.  Echo 09/03/15 EF 50-55%, apical anteroseptal HK, trivial AI, MAC, moderate MR, PASP 32 mmHg, trivial effusion  Carotid US 4/16 R 40-59%; L 60-79%  ETT 12/13 normal - no evidence of ischemia by ST analysis  LHC 5/05 LAD 30%   Past Medical History:  Diagnosis Date  . Anemia   . Arthritis    fingers   . Blood transfusion    1968  . Breast cancer ([place]) 2009   T1N0M0 DCIS right breast  . Breast cancer ([place]) 2013   a. reocurrence right breast;  b. s/p Taxotere, Cytoxan and XRT  . Carotid stenosis    a. dopplers 1/61:  RICA 0-96%, LICA 04-54% => repeat due in 01/2013;  b.  Carotid US (0/98): RICA 1-19%; LICA 14-78% - f/u 6 mos  . Fracture of left patella 12/2016  . History of radiation therapy 07/10/12-08/13/12   right breast/chest wall  . Hodgkin's disease    stage 2b;  s/p XRT 1988  . HTN (hypertension)   . Hypothyroidism    2/2 XRT for Hodgkins Lymphoma  . LBBB (left bundle branch block)   . Mucositis 04/29/2012  . NICM (nonischemic cardiomyopathy) ([place])    a. EF as low as 45%;  b. LHC 5/05:  LM 10%, pLAD 30%, EF 45-50%,   c. Echo 5/13:  EF 60-65%, Gr 1 diast dysfn, MAC;   d.  Echo 11/13:  EF 60-65%, Gr 1 diast dysfn, Tr AI, mild MR;  d.  Echo 05/10/13: EF 29-56%, grade 1 diastolic dysfunction, PASP 36, small effusion.  Marland Kitchen PAF (paroxysmal atrial fibrillation) ([place])    a. dx in 2008;  b. CHADS2-VASc=5;  c. changed to Luna during admx 05/2013; d. Xarelto started 05/2013; e. s/p successful Afib ablation 06/30/2017  . PEA (Pulseless electrical activity) ([place])    a. admx 05/2013 for APE in setting of HTN emergency c/b PEA arrest and asp pneumonia, VDRF, AFib with RVR  . Pneumonia 08/02/2018  . Raynaud's disease   . Status post radiation therapy 1988   mantle and periaortic    Surgical Hx: The patient  has a past surgical history that includes Tonsillectomy and adenoidectomy (1965); Thoractomy (1968); BTL (1971); R vein ligation &  stripping (1976); Spleenectomy (1988); Breast lumpectomy (2009); Mastectomy (2009); Polypectomy (1990); Dilation and curettage of uterus; Tissue expander placement; Tissue expander  removal w/ replacement of implant; Mass excision (03/28/2012); Portacath placement (04/16/2012); Port-a-cath removal (10/03/2012); IR RADIOLOGY PERIPHERAL GUIDED IV START (06/23/2017); IR US Guide Vasc Access Left (06/23/2017); ATRIAL FIBRILLATION ABLATION (06/30/2017); and ATRIAL FIBRILLATION ABLATION (N/A, 06/30/2017).   Current Medications: Current Meds  Medication Sig  . acetaminophen (TYLENOL) 325 MG tablet Take 650 mg by mouth at bedtime as needed for moderate pain.  Marland Kitchen albuterol (PROVENTIL HFA;VENTOLIN HFA) 108 (90 Base) MCG/ACT inhaler Inhale 2 puffs into the lungs every 6 (six) hours as needed for wheezing or shortness of breath.  Marland Kitchen atorvastatin (LIPITOR) 10 MG tablet Take 1 tablet (10 mg total) by mouth daily at 6 PM.  . b complex vitamins capsule Take 1 capsule by mouth  daily after breakfast.  . calcium carbonate (OS-CAL) 600 MG TABS Take 600 mg by mouth at bedtime.   . carvedilol (COREG) 12.5 MG tablet Take 1 tablet (12.5 mg total) by mouth 2 (two) times daily with a meal.  . Cholecalciferol (VITAMIN D) 2000 UNITS CAPS Take 2,000 Units by mouth daily with lunch.   . furosemide (LASIX) 40 MG tablet Take 1 tablet (40 mg total) by mouth daily.  Marland Kitchen levothyroxine (SYNTHROID, LEVOTHROID) 100 MCG tablet Take 100 mcg by mouth daily at 6 (six) AM.   . Multiple Vitamin (MULTIVITAMIN) tablet Take 1 tablet by mouth daily after breakfast. "Natural Vitamin"  . OVER THE COUNTER MEDICATION BDZ tablets: Take 1 tablet by mouth at bedtime  . OVER THE COUNTER MEDICATION Vegetarian Omega Green: Take 3 capsules by mouth daily with lunch  . rivaroxaban (XARELTO) 20 MG TABS tablet Take 20 mg by mouth daily with supper.  . [DISCONTINUED] amLODipine (NORVASC) 10 MG tablet Take 1 tablet (10 mg total) by mouth daily.     Allergies:   Ace  inhibitors; Benazepril; Epinephrine; Iodinated diagnostic agents; Potassium-containing compounds; Prednisolone; and Taxotere [docetaxel]   Social History   Tobacco Use  . Smoking status: Never Smoker  . Smokeless tobacco: Never Used  Substance Use Topics  . Alcohol use: No  . Drug use: No     Family Hx: The patient's family history includes Hypertension in her mother. There is no history of Cancer.  ROS:   Please see the history of present illness.    Review of Systems  Respiratory: Positive for shortness of breath.    All other systems reviewed and are negative.   EKGs/Labs/Other Test Reviewed:    EKG:  EKG is  ordered today.  The ekg ordered today demonstrates sinus rhythm, heart rate 83, left axis deviation, QRS 130, QTC 507, frequent PVCs, similar to prior EKGs   Recent Labs: 08/19/2018: ALT 38; B Natriuretic Peptide 374.8 08/22/2018: Hemoglobin 12.5; Magnesium 1.9; Platelets 312 08/24/2018: BUN 33; Creatinine, Ser 0.81; Potassium 3.6; Sodium 140   Recent Lipid Panel Lab Results  Component Value Date/Time   CHOL 163 05/18/2013 04:40 AM   TRIG 175 (H) 05/18/2013 04:40 AM   HDL 26 (L) 05/18/2013 04:40 AM   CHOLHDL 6.3 05/18/2013 04:40 AM   LDLCALC 102 (H) 05/18/2013 04:40 AM    Physical Exam:    VS:  BP 102/62   Pulse 86   Ht 5' 1"  (1.549 m)   Wt 132 lb (59.9 kg)   LMP 03/08/1995   SpO2 98%   BMI 24.94 kg/m     Wt Readings from Last 3 Encounters:  08/29/18 132 lb (59.9 kg)  08/24/18 133 lb 9.6 oz (60.6 kg)  08/17/18 138 lb 6.4 oz (62.8 kg)     Physical Exam  Constitutional: She is oriented to person, place, and time. She appears well-developed and well-nourished. No distress.  HENT:  Head: Normocephalic and atraumatic.  Eyes: No scleral icterus.  Neck: No JVD present. No thyromegaly present.  Cardiovascular: Normal rate. An irregularly irregular rhythm present.  No murmur heard. Pulmonary/Chest: Effort normal. She has no wheezes. She has no rales.    Abdominal: Soft. She exhibits no distension.  Musculoskeletal: She exhibits no edema.  Neurological: She is alert and oriented to person, place, and time.  Skin: Skin is warm and dry.  Psychiatric: She has a normal mood and affect.    ASSESSMENT & PLAN:    Chronic diastolic CHF (congestive heart failure) (  [place]) He had a recent admission with acute on chronic heart failure and pneumonia.  Her volume is improved.  She is feeling better.  Continue current dose of diuretic.  Obtain follow-up BMET.  NICM (nonischemic cardiomyopathy) ([place]) She has a history of cardiomyopathy with improved LV function.  She has several PVCs noted on EKG.  I am concerned that she may have a large burden of PVCs.  Question if she has developed reduced LV function which is contributed to her heart failure.  She seems to have had worsening PVCs since her recent echocardiogram in May 2019.  -Arrange follow-up echocardiogram  Paroxysmal atrial fibrillation [facility])  She is status post PVI ablation.  She has maintained sinus rhythm.  She is currently off of dofetilide.  She remains on Rivaroxaban for anticoagulation.  PVC's (premature ventricular contractions)  She is not that symptomatic with these.  As noted, she seems to be having a significant amount.  Repeat BMET will be obtained today.  I will arrange a follow-up echocardiogram as well as a 24-hour Holter.  Follow-up with Dr. Curt Bears in about 6 weeks.  Essential hypertension Blood pressure has been running low at times.  Decrease Norvasc to 5 mg daily.  If her blood pressure continues to run low, consider discontinuing Norvasc.  Bilateral carotid artery disease, unspecified type ([place]) She is not on aspirin as she is on rivaroxaban.  Continue statin.  Follow-up Dopplers are due in December 2019.   Dispo:  Return in about 6 weeks (around 10/10/2018) for Follow up after testing w/ Dr. Curt Bears.   Medication Adjustments/Labs and Tests Ordered: Current medicines are  reviewed at length with the patient today.  Concerns regarding medicines are outlined above.  Tests Ordered: Orders Placed This Encounter  Procedures  . Basic Metabolic Panel (BMET)  . Holter monitor - 24 hour  . EKG 12-Lead  . ECHOCARDIOGRAM LIMITED   Medication Changes: Meds ordered this encounter  Medications  . amLODipine (NORVASC) 5 MG tablet    Sig: Take 1 tablet (5 mg total) by mouth daily.    Dispense:  90 tablet    Refill:  3    DOSE CHANGE    Signed, Richardson Dopp, PA-C  08/29/2018 5:41 PM    [facility] [place], [place], [place]  [address] Phone: [phone]; Fax: [phone]

## 2018-08-30 DIAGNOSIS — Z8571 Personal history of Hodgkin lymphoma: Secondary | ICD-10-CM | POA: Diagnosis not present

## 2018-08-30 DIAGNOSIS — I429 Cardiomyopathy, unspecified: Secondary | ICD-10-CM | POA: Diagnosis not present

## 2018-08-30 DIAGNOSIS — Z7901 Long term (current) use of anticoagulants: Secondary | ICD-10-CM | POA: Diagnosis not present

## 2018-08-30 DIAGNOSIS — I5033 Acute on chronic diastolic (congestive) heart failure: Secondary | ICD-10-CM | POA: Diagnosis not present

## 2018-08-30 DIAGNOSIS — I480 Paroxysmal atrial fibrillation: Secondary | ICD-10-CM | POA: Diagnosis not present

## 2018-08-30 DIAGNOSIS — Z853 Personal history of malignant neoplasm of breast: Secondary | ICD-10-CM | POA: Diagnosis not present

## 2018-08-30 DIAGNOSIS — Z8701 Personal history of pneumonia (recurrent): Secondary | ICD-10-CM | POA: Diagnosis not present

## 2018-08-30 DIAGNOSIS — I110 Hypertensive heart disease with heart failure: Secondary | ICD-10-CM | POA: Diagnosis not present

## 2018-08-30 DIAGNOSIS — J9621 Acute and chronic respiratory failure with hypoxia: Secondary | ICD-10-CM | POA: Diagnosis not present

## 2018-08-30 LAB — BASIC METABOLIC PANEL
BUN/Creatinine Ratio: 32 — ABNORMAL HIGH (ref 12–28)
BUN: 23 mg/dL (ref 8–27)
CALCIUM: 9.5 mg/dL (ref 8.7–10.3)
CHLORIDE: 96 mmol/L (ref 96–106)
CO2: 27 mmol/L (ref 20–29)
CREATININE: 0.73 mg/dL (ref 0.57–1.00)
GFR calc non Af Amer: 83 mL/min/{1.73_m2} (ref >59)
GFR, EST AFRICAN AMERICAN: 95 mL/min/{1.73_m2} (ref >59)
Glucose: 95 mg/dL (ref 65–99)
Potassium: 4.2 mmol/L (ref 3.5–5.2)
Sodium: 139 mmol/L (ref 134–144)

## 2018-08-31 DIAGNOSIS — M5382 Other specified dorsopathies, cervical region: Secondary | ICD-10-CM | POA: Diagnosis not present

## 2018-08-31 DIAGNOSIS — I5032 Chronic diastolic (congestive) heart failure: Secondary | ICD-10-CM | POA: Diagnosis not present

## 2018-08-31 DIAGNOSIS — J189 Pneumonia, unspecified organism: Secondary | ICD-10-CM | POA: Diagnosis not present

## 2018-08-31 DIAGNOSIS — I480 Paroxysmal atrial fibrillation: Secondary | ICD-10-CM | POA: Diagnosis not present

## 2018-09-03 DIAGNOSIS — M542 Cervicalgia: Secondary | ICD-10-CM | POA: Diagnosis not present

## 2018-09-04 DIAGNOSIS — I480 Paroxysmal atrial fibrillation: Secondary | ICD-10-CM | POA: Diagnosis not present

## 2018-09-04 DIAGNOSIS — I429 Cardiomyopathy, unspecified: Secondary | ICD-10-CM | POA: Diagnosis not present

## 2018-09-04 DIAGNOSIS — I5033 Acute on chronic diastolic (congestive) heart failure: Secondary | ICD-10-CM | POA: Diagnosis not present

## 2018-09-04 DIAGNOSIS — I110 Hypertensive heart disease with heart failure: Secondary | ICD-10-CM | POA: Diagnosis not present

## 2018-09-04 DIAGNOSIS — J9621 Acute and chronic respiratory failure with hypoxia: Secondary | ICD-10-CM | POA: Diagnosis not present

## 2018-09-04 DIAGNOSIS — Z8701 Personal history of pneumonia (recurrent): Secondary | ICD-10-CM | POA: Diagnosis not present

## 2018-09-05 DIAGNOSIS — I5033 Acute on chronic diastolic (congestive) heart failure: Secondary | ICD-10-CM | POA: Diagnosis not present

## 2018-09-05 DIAGNOSIS — I110 Hypertensive heart disease with heart failure: Secondary | ICD-10-CM | POA: Diagnosis not present

## 2018-09-05 DIAGNOSIS — I429 Cardiomyopathy, unspecified: Secondary | ICD-10-CM | POA: Diagnosis not present

## 2018-09-05 DIAGNOSIS — Z8701 Personal history of pneumonia (recurrent): Secondary | ICD-10-CM | POA: Diagnosis not present

## 2018-09-05 DIAGNOSIS — J9621 Acute and chronic respiratory failure with hypoxia: Secondary | ICD-10-CM | POA: Diagnosis not present

## 2018-09-05 DIAGNOSIS — I480 Paroxysmal atrial fibrillation: Secondary | ICD-10-CM | POA: Diagnosis not present

## 2018-09-10 ENCOUNTER — Other Ambulatory Visit: Payer: Self-pay | Admitting: Family Medicine

## 2018-09-10 ENCOUNTER — Other Ambulatory Visit: Payer: Self-pay

## 2018-09-10 ENCOUNTER — Ambulatory Visit
Admission: RE | Admit: 2018-09-10 | Discharge: 2018-09-10 | Disposition: A | Discharge status: Home / Self Care | Payer: Medicare Other | Source: Ambulatory Visit | Attending: Family Medicine | Admitting: Family Medicine

## 2018-09-10 DIAGNOSIS — J189 Pneumonia, unspecified organism: Secondary | ICD-10-CM

## 2018-09-10 DIAGNOSIS — I509 Heart failure, unspecified: Secondary | ICD-10-CM | POA: Diagnosis not present

## 2018-09-10 NOTE — Patient Outreach (Signed)
[facility]) Care Management  09/10/2018  [name] 1946/10/23 [id number]  Transition of care  / EMM pneumonia red alert follow up Referral date: 08/24/18 Referral source:  Discharged from [facility] on 08/24/18 Insurance: Medicare Attempt #1  Telephone call to patient regarding referral. Unable to reach patient. HIPAA compliant voice message left with call back phone number.   PLAN: RNCM will attempt 2nd telephone call to patient within 4 business days. RNCM will send outreach letter.   Quinn Plowman RN,BSN, [place] Telephonic  [phone]

## 2018-09-13 ENCOUNTER — Other Ambulatory Visit: Payer: Self-pay

## 2018-09-13 DIAGNOSIS — I429 Cardiomyopathy, unspecified: Secondary | ICD-10-CM | POA: Diagnosis not present

## 2018-09-13 DIAGNOSIS — I110 Hypertensive heart disease with heart failure: Secondary | ICD-10-CM | POA: Diagnosis not present

## 2018-09-13 DIAGNOSIS — I480 Paroxysmal atrial fibrillation: Secondary | ICD-10-CM | POA: Diagnosis not present

## 2018-09-13 DIAGNOSIS — J9621 Acute and chronic respiratory failure with hypoxia: Secondary | ICD-10-CM | POA: Diagnosis not present

## 2018-09-13 DIAGNOSIS — Z8701 Personal history of pneumonia (recurrent): Secondary | ICD-10-CM | POA: Diagnosis not present

## 2018-09-13 DIAGNOSIS — I5033 Acute on chronic diastolic (congestive) heart failure: Secondary | ICD-10-CM | POA: Diagnosis not present

## 2018-09-13 NOTE — Patient Outreach (Signed)
[facility] [facility]) Care Management  09/13/2018  [name] Dec 30, 1945 [id number]  Transition of care  / EMM pneumonia red alert follow up Referral date: 08/24/18 Referral source:  Discharged from [facility] on 08/24/18 Insurance: Medicare  Telephone call to patient regarding transition of care follow up. HIPAA verified with patient. Patient states the automated calls for her have stopped.  Patient reports her oxygen equipment was delivered and states [facility] is providing nursing services weekly.  Patient states she is doing much better.  She reports she is weighing daily and denies any swelling. Patient states she has been able to walk around the block with a neighbor and her dog.   Patient states she does not feel she needs [facility] care management services at this time due to have [facility] nurse.  RNCM explained to patient she is able to have [facility] care management services in conjunction with her home health. Patient declined.  Verbalized appreciation for the past follow up.  RNCM confirmed patient has contact phone number for [facility] care management and advised her to call if she feels services are needed in the future. Patient verbalized understanding.    PLAN:  RNCM will close patient due to refusal of services.  RNCM will send closure letter to patients primary MD.   Quinn Plowman RN,BSN,CCM [facility] Telephonic  [phone]

## 2018-09-18 ENCOUNTER — Ambulatory Visit (HOSPITAL_COMMUNITY): Payer: Medicare Other | Attending: Cardiology

## 2018-09-18 ENCOUNTER — Ambulatory Visit (INDEPENDENT_AMBULATORY_CARE_PROVIDER_SITE_OTHER): Payer: Medicare Other

## 2018-09-18 ENCOUNTER — Other Ambulatory Visit: Payer: Self-pay

## 2018-09-18 ENCOUNTER — Encounter: Payer: Self-pay | Admitting: Physician Assistant

## 2018-09-18 DIAGNOSIS — I428 Other cardiomyopathies: Secondary | ICD-10-CM | POA: Insufficient documentation

## 2018-09-18 DIAGNOSIS — I5032 Chronic diastolic (congestive) heart failure: Secondary | ICD-10-CM

## 2018-09-18 DIAGNOSIS — I480 Paroxysmal atrial fibrillation: Secondary | ICD-10-CM | POA: Insufficient documentation

## 2018-09-18 DIAGNOSIS — I493 Ventricular premature depolarization: Secondary | ICD-10-CM

## 2018-09-18 DIAGNOSIS — I081 Rheumatic disorders of both mitral and tricuspid valves: Secondary | ICD-10-CM | POA: Insufficient documentation

## 2018-09-20 DIAGNOSIS — I480 Paroxysmal atrial fibrillation: Secondary | ICD-10-CM | POA: Diagnosis not present

## 2018-09-20 DIAGNOSIS — I5033 Acute on chronic diastolic (congestive) heart failure: Secondary | ICD-10-CM | POA: Diagnosis not present

## 2018-09-20 DIAGNOSIS — I429 Cardiomyopathy, unspecified: Secondary | ICD-10-CM | POA: Diagnosis not present

## 2018-09-20 DIAGNOSIS — Z8701 Personal history of pneumonia (recurrent): Secondary | ICD-10-CM | POA: Diagnosis not present

## 2018-09-20 DIAGNOSIS — J9621 Acute and chronic respiratory failure with hypoxia: Secondary | ICD-10-CM | POA: Diagnosis not present

## 2018-09-20 DIAGNOSIS — I110 Hypertensive heart disease with heart failure: Secondary | ICD-10-CM | POA: Diagnosis not present

## 2018-09-24 ENCOUNTER — Ambulatory Visit: Payer: Medicare Other | Admitting: Hematology and Oncology

## 2018-09-26 ENCOUNTER — Other Ambulatory Visit: Payer: Self-pay | Admitting: *Deleted

## 2018-09-26 DIAGNOSIS — I429 Cardiomyopathy, unspecified: Secondary | ICD-10-CM | POA: Diagnosis not present

## 2018-09-26 DIAGNOSIS — I480 Paroxysmal atrial fibrillation: Secondary | ICD-10-CM | POA: Diagnosis not present

## 2018-09-26 DIAGNOSIS — I5033 Acute on chronic diastolic (congestive) heart failure: Secondary | ICD-10-CM | POA: Diagnosis not present

## 2018-09-26 DIAGNOSIS — I110 Hypertensive heart disease with heart failure: Secondary | ICD-10-CM | POA: Diagnosis not present

## 2018-09-26 DIAGNOSIS — J9621 Acute and chronic respiratory failure with hypoxia: Secondary | ICD-10-CM | POA: Diagnosis not present

## 2018-09-26 DIAGNOSIS — Z8701 Personal history of pneumonia (recurrent): Secondary | ICD-10-CM | POA: Diagnosis not present

## 2018-09-26 MED ORDER — HYDRALAZINE HCL 25 MG PO TABS: 25.0000 mg | ORAL_TABLET | Freq: Three times a day (TID) | ORAL | 10 refills | Status: AC

## 2018-09-26 NOTE — Telephone Encounter (Signed)
Refill request sent to Richardson Dopp, PA to fill Hydralazine for the pt. I looked in the chart even all the way back to 2015 I do not see Hydralazine in her med list. Looks like maybe Dr. Vernell Leep maybe prescribed. I would have the pharmacy touch base with them or her PCP. Richardson Dopp, [place] is out of the office for the remainder of the week.

## 2018-09-26 NOTE — Telephone Encounter (Signed)
Patient requesting that Richardson Dopp refill her hydralazine. Okay to refill? Please advise. Thanks, MI

## 2018-09-26 NOTE — Telephone Encounter (Signed)
Spoke with patient and she stated that she has spoke with her pcp's office and they instructed her to call our office. She also went on to say that when she was seen here she was instructed to hold the hydralazine if her systolic was below 709, she read it off of the discharge summary from her visit with Richardson Dopp and states that it was highlighted. She stated that it was on her office visit med list from her visit with Nicki Reaper as well she says that it is on page 3. Patient can be reached at [phone]. Thanks, MI

## 2018-10-01 ENCOUNTER — Other Ambulatory Visit: Payer: Self-pay

## 2018-10-01 DIAGNOSIS — C50911 Malignant neoplasm of unspecified site of right female breast: Secondary | ICD-10-CM

## 2018-10-01 DIAGNOSIS — C7989 Secondary malignant neoplasm of other specified sites: Principal | ICD-10-CM

## 2018-10-02 ENCOUNTER — Inpatient Hospital Stay: Payer: Medicare Other | Attending: Hematology and Oncology | Admitting: Hematology and Oncology

## 2018-10-02 ENCOUNTER — Telehealth: Payer: Self-pay | Admitting: Hematology and Oncology

## 2018-10-02 ENCOUNTER — Inpatient Hospital Stay: Payer: Medicare Other

## 2018-10-02 DIAGNOSIS — Z9081 Acquired absence of spleen: Secondary | ICD-10-CM

## 2018-10-02 DIAGNOSIS — C50911 Malignant neoplasm of unspecified site of right female breast: Secondary | ICD-10-CM

## 2018-10-02 DIAGNOSIS — Z8571 Personal history of Hodgkin lymphoma: Secondary | ICD-10-CM | POA: Diagnosis not present

## 2018-10-02 DIAGNOSIS — Z853 Personal history of malignant neoplasm of breast: Secondary | ICD-10-CM | POA: Diagnosis not present

## 2018-10-02 DIAGNOSIS — Z9011 Acquired absence of right breast and nipple: Secondary | ICD-10-CM

## 2018-10-02 DIAGNOSIS — C50011 Malignant neoplasm of nipple and areola, right female breast: Secondary | ICD-10-CM

## 2018-10-02 DIAGNOSIS — C7989 Secondary malignant neoplasm of other specified sites: Principal | ICD-10-CM

## 2018-10-02 LAB — CBC WITH DIFFERENTIAL (CANCER CENTER ONLY)
Abs Immature Granulocytes: 0.02 10*3/uL (ref 0.00–0.07)
BASOS PCT: 1 %
Basophils Absolute: 0.1 10*3/uL (ref 0.0–0.1)
EOS ABS: 0.2 10*3/uL (ref 0.0–0.5)
EOS PCT: 2 %
HCT: 37.6 % (ref 36.0–46.0)
Hemoglobin: 12.2 g/dL (ref 12.0–15.0)
Immature Granulocytes: 0 %
Lymphocytes Relative: 14 %
Lymphs Abs: 1.3 10*3/uL (ref 0.7–4.0)
MCH: 30 pg (ref 26.0–34.0)
MCHC: 32.4 g/dL (ref 30.0–36.0)
MCV: 92.6 fL (ref 80.0–100.0)
MONO ABS: 1 10*3/uL (ref 0.1–1.0)
Monocytes Relative: 11 %
Neutro Abs: 6.9 10*3/uL (ref 1.7–7.7)
Neutrophils Relative %: 72 %
PLATELETS: 305 10*3/uL (ref 150–400)
RBC: 4.06 MIL/uL (ref 3.87–5.11)
RDW: 14.8 % (ref 11.5–15.5)
WBC Count: 9.6 10*3/uL (ref 4.0–10.5)
nRBC: 0 % (ref 0.0–0.2)

## 2018-10-02 LAB — CMP (CANCER CENTER ONLY)
ALK PHOS: 92 U/L (ref 38–126)
ALT: 33 U/L (ref 0–44)
ANION GAP: 8 (ref 5–15)
AST: 28 U/L (ref 15–41)
Albumin: 3.8 g/dL (ref 3.5–5.0)
BILIRUBIN TOTAL: 0.6 mg/dL (ref 0.3–1.2)
BUN: 23 mg/dL (ref 8–23)
CO2: 32 mmol/L (ref 22–32)
Calcium: 9.9 mg/dL (ref 8.9–10.3)
Chloride: 99 mmol/L (ref 98–111)
Creatinine: 0.87 mg/dL (ref 0.44–1.00)
GFR, Estimated: >60 mL/min (ref >60)
Glucose, Bld: 96 mg/dL (ref 70–99)
POTASSIUM: 4.6 mmol/L (ref 3.5–5.1)
Sodium: 139 mmol/L (ref 135–145)
TOTAL PROTEIN: 6.8 g/dL (ref 6.5–8.1)

## 2018-10-02 NOTE — Progress Notes (Signed)
Patient Care Team: [name], MD as PCP - General Curt Bears, Ocie Doyne, MD as PCP - Electrophysiology (Cardiology) Josue Hector, MD as PCP - Cardiology (Cardiology) Dannielle Karvonen, RN as [place] Management  DIAGNOSIS:  Encounter Diagnosis  Name Primary?  . Malignant neoplasm of nipple of right breast in female, unspecified estrogen receptor status ([place])      CHIEF COMPLIANT: Follow-up of breast cancer.  Recent hospitalization for congestive heart failure  INTERVAL HISTORY: [name] is a 72 year old with above-mentioned history of breast cancer as well as recent hospitalization for congestive heart failure.  She had a prior history of Hodgkin lymphoma that required mantle radiation.  She is feeling remarkably better since the hospitalization.  Denies any chest pain lumps or nodules in the breasts or any other concerns.  REVIEW OF SYSTEMS:   Constitutional: Denies fevers, chills or abnormal weight loss Eyes: Denies blurriness of vision Ears, nose, mouth, throat, and face: Denies mucositis or sore throat Respiratory: Denies cough, dyspnea or wheezes Cardiovascular: Denies palpitation, chest discomfort Gastrointestinal:  Denies nausea, heartburn or change in bowel habits Skin: Denies abnormal skin rashes Lymphatics: Denies new lymphadenopathy or easy bruising Neurological:Denies numbness, tingling or new weaknesses Behavioral/Psych: Mood is stable, no new changes  Extremities: No lower extremity edema Breast:  denies any pain or lumps or nodules in either breasts All other systems were reviewed with the patient and are negative.  I have reviewed the past medical history, past surgical history, social history and family history with the patient and they are unchanged from previous note.  ALLERGIES:  is allergic to ace inhibitors; benazepril; epinephrine; iodinated diagnostic agents; potassium-containing compounds; prednisolone; and taxotere  [docetaxel].  MEDICATIONS:  Current Outpatient Medications  Medication Sig Dispense Refill  . acetaminophen (TYLENOL) 325 MG tablet Take 650 mg by mouth at bedtime as needed for moderate pain.    Marland Kitchen albuterol (PROVENTIL HFA;VENTOLIN HFA) 108 (90 Base) MCG/ACT inhaler Inhale 2 puffs into the lungs every 6 (six) hours as needed for wheezing or shortness of breath. 1 Inhaler 2  . amLODipine (NORVASC) 5 MG tablet Take 1 tablet (5 mg total) by mouth daily. 90 tablet 3  . atorvastatin (LIPITOR) 10 MG tablet Take 1 tablet (10 mg total) by mouth daily at 6 PM. 90 tablet 3  . b complex vitamins capsule Take 1 capsule by mouth daily after breakfast.    . calcium carbonate (OS-CAL) 600 MG TABS Take 600 mg by mouth at bedtime.     . carvedilol (COREG) 12.5 MG tablet Take 1 tablet (12.5 mg total) by mouth 2 (two) times daily with a meal. 30 tablet 0  . Cholecalciferol (VITAMIN D) 2000 UNITS CAPS Take 2,000 Units by mouth daily with lunch.     . furosemide (LASIX) 40 MG tablet Take 1 tablet (40 mg total) by mouth daily. 30 tablet 0  . hydrALAZINE (APRESOLINE) 25 MG tablet Take 1 tablet (25 mg total) by mouth 3 (three) times daily. 90 tablet 10  . levothyroxine (SYNTHROID, LEVOTHROID) 100 MCG tablet Take 100 mcg by mouth daily at 6 (six) AM.     . Multiple Vitamin (MULTIVITAMIN) tablet Take 1 tablet by mouth daily after breakfast. "Natural Vitamin"    . OVER THE COUNTER MEDICATION BDZ tablets: Take 1 tablet by mouth at bedtime    . OVER THE COUNTER MEDICATION Vegetarian Omega Green: Take 3 capsules by mouth daily with lunch    . rivaroxaban (XARELTO) 20 MG TABS tablet  Take 20 mg by mouth daily with supper.     No current facility-administered medications for this visit.     PHYSICAL EXAMINATION: ECOG PERFORMANCE STATUS: 1 - Symptomatic but completely ambulatory  Vitals:   10/02/18 1115  BP: (!) 107/59  Pulse: 80  Resp: 16  Temp: 98.5 F (36.9 C)  SpO2: 97%   Filed Weights   10/02/18 1115    Weight: 133 lb 12.8 oz (60.7 kg)    GENERAL:alert, no distress and comfortable SKIN: skin color, texture, turgor are normal, no rashes or significant lesions EYES: normal, Conjunctiva are pink and non-injected, sclera clear OROPHARYNX:no exudate, no erythema and lips, buccal mucosa, and tongue normal  NECK: supple, thyroid normal size, non-tender, without nodularity LYMPH:  no palpable lymphadenopathy in the cervical, axillary or inguinal LUNGS: clear to auscultation and percussion with normal breathing effort HEART: regular rate & rhythm and no murmurs and no lower extremity edema ABDOMEN:abdomen soft, non-tender and normal bowel sounds MUSCULOSKELETAL:no cyanosis of digits and no clubbing  NEURO: alert & oriented x 3 with fluent speech, no focal motor/sensory deficits EXTREMITIES: No lower extremity edema BREAST: No palpable masses or nodules in either right or left breasts. No palpable axillary supraclavicular or infraclavicular adenopathy no breast tenderness or nipple discharge. (exam performed in the presence of a chaperone)  LABORATORY DATA:  I have reviewed the data as listed CMP Latest Ref Rng & Units 10/02/2018 08/29/2018 08/24/2018  Glucose 70 - 99 mg/dL 96 95 128(H)  BUN 8 - 23 mg/dL 23 23 33(H)  Creatinine 0.44 - 1.00 mg/dL 0.87 0.73 0.81  Sodium 135 - 145 mmol/L 139 139 140  Potassium 3.5 - 5.1 mmol/L 4.6 4.2 3.6  Chloride 98 - 111 mmol/L 99 96 97(L)  CO2 22 - 32 mmol/L 32 27 31  Calcium 8.9 - 10.3 mg/dL 9.9 9.5 9.2  Total Protein 6.5 - 8.1 g/dL 6.8 - -  Total Bilirubin 0.3 - 1.2 mg/dL 0.6 - -  Alkaline Phos 38 - 126 U/L 92 - -  AST 15 - 41 U/L 28 - -  ALT 0 - 44 U/L 33 - -    Lab Results  Component Value Date   WBC 9.6 10/02/2018   HGB 12.2 10/02/2018   HCT 37.6 10/02/2018   MCV 92.6 10/02/2018   PLT 305 10/02/2018   NEUTROABS 6.9 10/02/2018    ASSESSMENT & PLAN:   Cancer of Breast T1bN0M0 triple neg S/P mastectomy/reconstruction 04/2008 Recurrent right  breast triple negative cancer: Originally diagnosed with Hodgkin lymphoma in 1987 treated with mantle radiation. she developed breast cancer in 2009 underwent right breast mastectomy for a T1 B. N0 M0 stage IA triple negative breast cancer. In 2013, patient developed chest wall recurrence that was resected. She is currently being followed by mammograms every year and breast MRIs every other year.  Breast Cancer Surveillance: 1. Breast exam 0/07/2017: Normal, right breast reconstructed, left breast no palpable lumps or nodules 2. Mammogram June 2018: Done at Dr. Alan Ripper office  3. MRI breast: Benign  S/P Postsplenectomy.  Hodgkin's lymphoma: Diagnosed 29years ago and is in remission after mantle radiation  Patient's husband died in 2043from lung cancer.  Recent hospitalization for CHF and acute respiratory failure and H CAP: 08/19/2018 to 08/24/2018 Return to clinic in 1 year for follow-up.    No orders of the defined types were placed in this encounter.  The patient has a good understanding of the overall plan. she agrees with it. she will call with  any problems that may develop before the next visit here.   Harriette Ohara, MD 10/02/18

## 2018-10-02 NOTE — Assessment & Plan Note (Signed)
Recurrent right breast triple negative cancer: Originally diagnosed with Hodgkin lymphoma in 1987 treated with mantle radiation. He developed breast cancer in 2009 underwent right breast mastectomy for a T1 B. N0 M0 stage IA triple negative breast cancer. In 2013, patient developed chest wall recurrence that was resected. She is currently being followed by mammograms every year and breast MRIs every other year.  Breast Cancer Surveillance: 1. Breast exam 0/07/2017: Normal, right breast reconstructed, left breast no palpable lumps or nodules 2. Mammogram June 2018: Done at Dr. Alan Ripper office  3. MRI breast: Benign  S/P Postsplenectomy.  Hodgkin's lymphoma: Diagnosed 29years ago and is in remission after mantle radiation  Patient's husband died in 2027from lung cancer.  Recent hospitalization for CHF and acute respiratory failure and H CAP: 08/19/2018 to 08/24/2018 Return to clinic in 1 year for follow-up.

## 2018-10-02 NOTE — Telephone Encounter (Signed)
Per 10/15  Decline avs and calendar

## 2018-10-03 DIAGNOSIS — Z8701 Personal history of pneumonia (recurrent): Secondary | ICD-10-CM | POA: Diagnosis not present

## 2018-10-03 DIAGNOSIS — I5033 Acute on chronic diastolic (congestive) heart failure: Secondary | ICD-10-CM | POA: Diagnosis not present

## 2018-10-03 DIAGNOSIS — J9621 Acute and chronic respiratory failure with hypoxia: Secondary | ICD-10-CM | POA: Diagnosis not present

## 2018-10-03 DIAGNOSIS — I480 Paroxysmal atrial fibrillation: Secondary | ICD-10-CM | POA: Diagnosis not present

## 2018-10-03 DIAGNOSIS — I110 Hypertensive heart disease with heart failure: Secondary | ICD-10-CM | POA: Diagnosis not present

## 2018-10-03 DIAGNOSIS — I429 Cardiomyopathy, unspecified: Secondary | ICD-10-CM | POA: Diagnosis not present

## 2018-10-04 ENCOUNTER — Telehealth: Payer: Self-pay

## 2018-10-04 NOTE — Telephone Encounter (Signed)
Pt advised Holter results and will keep follow up per Dr. Curt Bears on 10/11/18.

## 2018-10-11 ENCOUNTER — Ambulatory Visit (INDEPENDENT_AMBULATORY_CARE_PROVIDER_SITE_OTHER): Payer: Medicare Other | Admitting: Cardiology

## 2018-10-11 ENCOUNTER — Other Ambulatory Visit: Payer: Self-pay | Admitting: Family Medicine

## 2018-10-11 ENCOUNTER — Encounter: Payer: Self-pay | Admitting: *Deleted

## 2018-10-11 ENCOUNTER — Ambulatory Visit
Admission: RE | Admit: 2018-10-11 | Discharge: 2018-10-11 | Disposition: A | Discharge status: Home / Self Care | Payer: Medicare Other | Source: Ambulatory Visit | Attending: Family Medicine | Admitting: Family Medicine

## 2018-10-11 ENCOUNTER — Encounter: Payer: Self-pay | Admitting: Cardiology

## 2018-10-11 VITALS — BP 116/60 | HR 42 | Ht 61.0 in | Wt 135.0 lb

## 2018-10-11 DIAGNOSIS — Z8701 Personal history of pneumonia (recurrent): Secondary | ICD-10-CM

## 2018-10-11 DIAGNOSIS — I493 Ventricular premature depolarization: Secondary | ICD-10-CM | POA: Diagnosis not present

## 2018-10-11 DIAGNOSIS — I6523 Occlusion and stenosis of bilateral carotid arteries: Secondary | ICD-10-CM | POA: Diagnosis not present

## 2018-10-11 DIAGNOSIS — I10 Essential (primary) hypertension: Secondary | ICD-10-CM | POA: Diagnosis not present

## 2018-10-11 DIAGNOSIS — I480 Paroxysmal atrial fibrillation: Secondary | ICD-10-CM

## 2018-10-11 DIAGNOSIS — I5032 Chronic diastolic (congestive) heart failure: Secondary | ICD-10-CM | POA: Diagnosis not present

## 2018-10-11 DIAGNOSIS — Z79899 Other long term (current) drug therapy: Secondary | ICD-10-CM

## 2018-10-11 DIAGNOSIS — J90 Pleural effusion, not elsewhere classified: Secondary | ICD-10-CM | POA: Diagnosis not present

## 2018-10-11 MED ORDER — FLECAINIDE ACETATE 100 MG PO TABS: 100.0000 mg | ORAL_TABLET | Freq: Two times a day (BID) | ORAL | 3 refills | Status: DC

## 2018-10-11 NOTE — Progress Notes (Signed)
Electrophysiology Office Note   Date:  10/11/2018   ID:  [name] [birth year], MRN [id number]  PCP:  [name], MD  Cardiologist:  Johnsie Cancel Primary Electrophysiologist:  Javien Tesch Meredith Leeds, MD    No chief complaint on file.    History of Present Illness: [name] is a 72 y.o. female who is being seen today for the evaluation of atrial fibrillation at the request of [name], MD. Presenting today for electrophysiology evaluation. Has a history of nonischemic cardio myopathy with an EF that has since improved, diastolic heart failure, hypertension, left bundle branch block, hypothyroidism, Hodgkin's disease, breast cancer, and carotid stenosis.  Radiation therapy for Hodgkin's lymphoma in 1998, mastectomy in 2009 and recurrent breast cancer in 2013. EF 45% in the past which is since improved. Flecainide was started in 2013 for recurrent atrial fibrillation. Treadmill test was negative for arrhythmia. Admitted for pulmonary edema on 5/14 in the setting of hypertensive crisis compensated by PEA arrest. Currently on Tikosyn. Had AF ablation 06/30/17.   Today, denies symptoms of chest pain,  orthopnea, PND, lower extremity edema, claudication, dizziness, presyncope, syncope, bleeding, or neurologic sequela. The patient is tolerating medications without difficulties.  Overall she is feeling weak and fatigued at times.  She wore a cardiac monitor that showed 35% PVCs.  She does feel palpitations.  Past Medical History:  Diagnosis Date  . Anemia   . Arthritis    fingers   . Blood transfusion    1968  . Breast cancer ([place]) 2009   T1N0M0 DCIS right breast  . Breast cancer ([place]) 2013   a. reocurrence right breast;  b. s/p Taxotere, Cytoxan and XRT  . Carotid stenosis    a. dopplers 5/28:  RICA 4-13%, LICA 24-40% => repeat due in 01/2013;  b.  Carotid US (1/02): RICA 7-25%; LICA 36-64% - f/u 6 mos  . Chronic diastolic CHF (congestive heart failure) ([place])  11/28/2011   Echo 09/18/2018:  EF 55-60, no RWMA, Gr 2 DD, mod MR, mild TR  . Fracture of left patella 12/2016  . History of radiation therapy 07/10/12-08/13/12   right breast/chest wall  . Hodgkin's disease    stage 2b;  s/p XRT 1988  . HTN (hypertension)   . Hypothyroidism    2/2 XRT for Hodgkins Lymphoma  . LBBB (left bundle branch block)   . Mucositis 04/29/2012  . NICM (nonischemic cardiomyopathy) ([place])    a. EF as low as 45%;  b. LHC 5/05:  LM 10%, pLAD 30%, EF 45-50%,   c. Echo 5/13:  EF 60-65%, Gr 1 diast dysfn, MAC;   d.  Echo 11/13:  EF 60-65%, Gr 1 diast dysfn, Tr AI, mild MR;  d.  Echo 05/10/13: EF 40-34%, grade 1 diastolic dysfunction, PASP 36, small effusion.  Marland Kitchen PAF (paroxysmal atrial fibrillation) ([place])    a. dx in 2008;  b. CHADS2-VASc=5;  c. changed to [place] during admx 05/2013; d. Xarelto started 05/2013; e. s/p successful Afib ablation 06/30/2017  . PEA (Pulseless electrical activity) ([place])    a. admx 05/2013 for APE in setting of HTN emergency c/b PEA arrest and asp pneumonia, VDRF, AFib with RVR  . Pneumonia 08/02/2018  . Raynaud's disease   . Status post radiation therapy 1988   mantle and periaortic    Past Surgical History:  Procedure Laterality Date  . ATRIAL FIBRILLATION ABLATION  06/30/2017  . ATRIAL FIBRILLATION ABLATION N/A 06/30/2017   Procedure: Atrial Fibrillation Ablation;  Surgeon: Curt Bears,  Ocie Doyne, MD;  Location: [facility];  Service: Cardiovascular;  Laterality: N/A;  . BREAST LUMPECTOMY  2009   right, lymph node biopsy  . BTL  1971  . DILATION AND CURETTAGE OF UTERUS    . IR RADIOLOGY PERIPHERAL GUIDED IV START  06/23/2017  . IR US GUIDE VASC ACCESS LEFT  06/23/2017  . MASS EXCISION  03/28/2012   Procedure: EXCISION MASS;  Surgeon: Edward Jolly, MD;  Location: [facility];  Service: General;  Laterality: Right;  Excision right chest wall mass  . MASTECTOMY  2009   right breast  . POLYPECTOMY  1990   D&C (also in 1977 and  [place])   . PORT-A-CATH REMOVAL  10/03/2012   Procedure: REMOVAL PORT-A-CATH;  Surgeon: Edward Jolly, MD;  Location: [facility];  Service: General;  Laterality: N/A;  . PORTACATH PLACEMENT  04/16/2012   Procedure: INSERTION PORT-A-CATH;  Surgeon: Edward Jolly, MD;  Location: [facility];  Service: General;  Laterality: Left;  Placement of Port-a-Cath, left subclavian  . R vein ligation & stripping  1976  . Spleenectomy  1988   for Hodgkin's disease  . Thoractomy  1968  . TISSUE EXPANDER  REMOVAL W/ REPLACEMENT OF IMPLANT    . TISSUE EXPANDER PLACEMENT     right breast  . TONSILLECTOMY AND ADENOIDECTOMY  1965     Current Outpatient Medications  Medication Sig Dispense Refill  . acetaminophen (TYLENOL) 325 MG tablet Take 650 mg by mouth at bedtime as needed for moderate pain.    Marland Kitchen albuterol (PROVENTIL HFA;VENTOLIN HFA) 108 (90 Base) MCG/ACT inhaler Inhale 2 puffs into the lungs every 6 (six) hours as needed for wheezing or shortness of breath. 1 Inhaler 2  . amLODipine (NORVASC) 5 MG tablet Take 1 tablet (5 mg total) by mouth daily. 90 tablet 3  . atorvastatin (LIPITOR) 10 MG tablet Take 1 tablet (10 mg total) by mouth daily at 6 PM. 90 tablet 3  . b complex vitamins capsule Take 1 capsule by mouth daily after breakfast.    . calcium carbonate (OS-CAL) 600 MG TABS Take 600 mg by mouth at bedtime.     . carvedilol (COREG) 12.5 MG tablet Take 1 tablet (12.5 mg total) by mouth 2 (two) times daily with a meal. 30 tablet 0  . Cholecalciferol (VITAMIN D) 2000 UNITS CAPS Take 2,000 Units by mouth daily with lunch.     . hydrALAZINE (APRESOLINE) 25 MG tablet Take 1 tablet (25 mg total) by mouth 3 (three) times daily. 90 tablet 10  . levothyroxine (SYNTHROID, LEVOTHROID) 100 MCG tablet Take 100 mcg by mouth daily at 6 (six) AM.     . Multiple Vitamin (MULTIVITAMIN) tablet Take 1 tablet by mouth daily after breakfast. "Natural Vitamin"    . OVER THE COUNTER MEDICATION BDZ tablets: Take 1 tablet by  mouth at bedtime    . OVER THE COUNTER MEDICATION Vegetarian Omega Green: Take 3 capsules by mouth daily with lunch    . rivaroxaban (XARELTO) 20 MG TABS tablet Take 20 mg by mouth daily with supper.    . flecainide (TAMBOCOR) 100 MG tablet Take 1 tablet (100 mg total) by mouth 2 (two) times daily. 60 tablet 3  . furosemide (LASIX) 40 MG tablet Take 1 tablet (40 mg total) by mouth daily. 30 tablet 0   No current facility-administered medications for this visit.     Allergies:   Ace inhibitors; Benazepril; Epinephrine; Iodinated diagnostic agents; Potassium-containing compounds;  and Taxotere [docetaxel]   Social History:  The patient  reports that she has never smoked. She has never used smokeless tobacco. She reports that she does not drink alcohol or use drugs.   Family History:  The patient's family history includes Hypertension in her mother.   ROS:  Please see the history of present illness.   Otherwise, review of systems is positive for palpitations.   All other systems are reviewed and negative.   PHYSICAL EXAM: VS:  BP 116/60   Pulse (!) 42   Ht 5' 1"  (1.549 m)   Wt 135 lb (61.2 kg)   LMP 03/08/1995   BMI 25.51 kg/m  , BMI Body mass index is 25.51 kg/m. GEN: Well nourished, well developed, in no acute distress  HEENT: normal  Neck: no JVD, carotid bruits, or masses Cardiac: iRRR; no murmurs, rubs, or gallops,no edema  Respiratory:  clear to auscultation bilaterally, normal work of breathing GI: soft, nontender, nondistended, + BS MS: no deformity or atrophy  Skin: warm and dry Neuro:  Strength and sensation are intact Psych: euthymic mood, full affect  EKG:  EKG is not ordered today. Personal review of the ekg ordered 9/*11/19 shows sinus rhythm, PVCs, rate 83, QTc 507, IVCD  Recent Labs: 08/19/2018: B Natriuretic Peptide 374.8 08/22/2018: Magnesium 1.9 10/02/2018: ALT 33; BUN 23; Creatinine 0.87; Hemoglobin 12.2; Platelet Count 305; Potassium 4.6; Sodium 139    Lipid  Panel     Component Value Date/Time   CHOL 163 05/18/2013 0440   TRIG 175 (H) 05/18/2013 0440   HDL 26 (L) 05/18/2013 0440   CHOLHDL 6.3 05/18/2013 0440   VLDL 35 05/18/2013 0440   LDLCALC 102 (H) 05/18/2013 0440     Wt Readings from Last 3 Encounters:  10/11/18 135 lb (61.2 kg)  10/02/18 133 lb 12.8 oz (60.7 kg)  08/29/18 132 lb (59.9 kg)      Other studies Reviewed: Additional studies/ records that were reviewed today include: TTE 09/18/18 Review of the above records today demonstrates:  - Left ventricle: The cavity size was normal. Systolic function was   normal. The estimated ejection fraction was in the range of 55%   to 60%. Wall motion was normal; there were no regional wall   motion abnormalities. Features are consistent with a pseudonormal   left ventricular filling pattern, with concomitant abnormal   relaxation and increased filling pressure (grade 2 diastolic   dysfunction). - Mitral valve: Mildly thickened leaflets . There was moderate   regurgitation. - Tricuspid valve: There was mild regurgitation.  Holter monitor 02/05/2018-personally reviewed Minimum HR: 69 BPM at 5:34:07 AM Maximum HR: 126 BPM at 1:15:44 PM Average HR: 84 BPM Ventricular Beats: 41760 (35%) Supraventricular Beats: 42 (<1%) Sinus rhythm with sinus tachycardia 17 beats irregular narrow complex tachycardia Diary event of resting with thumps associated with ventricular bigeminy  ASSESSMENT AND PLAN:  1.  Paroxysmal atrial fibrillation: On Coreg and Xarelto.  No further known episodes of atrial fibrillation.  No changes.  This patients CHA2DS2-VASc Score and unadjusted Ischemic Stroke Rate (% per year) is equal to 3.2 % stroke rate/year from a score of 3  Above score calculated as 1 point each if present [CHF, HTN, DM, Vascular=MI/PAD/Aortic Plaque, Age if 65-74, or Female] Above score calculated as 2 points each if present [Age > 75, or Stroke/TIA/TE]  2. Chronic diastolic heart  failure: Blood pressure well controlled today.  No signs of volume overload.  3. Hypertension: Well-controlled today  4.  PVCs: Sinus rhythm today.  She does have a 35% PVC burden on her cardiac monitor.  I do think that she Leira Regino need medications to treat this, as she does not wish to have ablation.  We Ayuub Penley start her on flecainide today.  Current medicines are reviewed at length with the patient today.   The patient does not have concerns regarding her medicines.  The following changes were made today: Start flecainide  Labs/ tests ordered today include:  Orders Placed This Encounter  Procedures  . Exercise Tolerance Test     Disposition:   FU with Horace Wishon 3 months  Signed, Phoenyx Melka Meredith Leeds, MD  10/11/2018 11:30 AM     [facility] [address] [place] [address] [phone] (office) [phone] (fax)

## 2018-10-11 NOTE — Patient Instructions (Signed)
Medication Instructions:  Your physician has recommended you make the following change in your medication:  1. START Flecainide 100 mg twice daily -- you will start this medication 7-10 days before your stress test.  If you need a refill on your cardiac medications before your next appointment, please call your pharmacy.   Lab work: None ordered  Testing/Procedures: Your physician has requested that you have an exercise tolerance test. For further information please visit [email or web address].   Follow-Up: At [facility], you and your health needs are our priority.  As part of our continuing mission to provide you with exceptional heart care, we have created designated Provider Care Teams.  These Care Teams include your primary Cardiologist (physician) and Advanced Practice Providers (APPs -  Physician Assistants and Nurse Practitioners) who all work together to provide you with the care you need, when you need it. You will need a follow up appointment in 3 months.  Please call our office 2 months in advance to schedule this appointment.  You may see Will Meredith Leeds, MD or one of the following Advanced Practice Providers on your designated Care Team:   Chanetta Marshall, NP . Tommye Standard, PA-C  Any Other Special Instructions Will Be Listed Below (If Applicable).  Flecainide tablets What is this medicine? FLECAINIDE (FLEK a nide) is an antiarrhythmic drug. This medicine is used to prevent irregular heart rhythm. It can also slow down fast heartbeats called tachycardia. This medicine may be used for other purposes; ask your health care provider or pharmacist if you have questions. COMMON BRAND NAME(S): Tambocor What should I tell my health care provider before I take this medicine? They need to know if you have any of these conditions: -abnormal levels of potassium in the blood -heart disease including heart rhythm and heart rate problems -kidney or liver disease -recent heart  attack -an unusual or allergic reaction to flecainide, local anesthetics, other medicines, foods, dyes, or preservatives -pregnant or trying to get pregnant -breast-feeding How should I use this medicine? Take this medicine by mouth with a glass of water. Follow the directions on the prescription label. You can take this medicine with or without food. Take your doses at regular intervals. Do not take your medicine more often than directed. Do not stop taking this medicine suddenly. This may cause serious, heart-related side effects. If your doctor wants you to stop the medicine, the dose may be slowly lowered over time to avoid any side effects. Talk to your pediatrician regarding the use of this medicine in children. While this drug may be prescribed for children as young as 1 year of age for selected conditions, precautions do apply. Overdosage: If you think you have taken too much of this medicine contact a poison control center or emergency room at once. NOTE: This medicine is only for you. Do not share this medicine with others. What if I miss a dose? If you miss a dose, take it as soon as you can. If it is almost time for your next dose, take only that dose. Do not take double or extra doses. What may interact with this medicine? Do not take this medicine with any of the following medications: -amoxapine -arsenic trioxide -certain antibiotics like clarithromycin, erythromycin, gatifloxacin, gemifloxacin, levofloxacin, moxifloxacin, sparfloxacin, or troleandomycin -certain antidepressants called tricyclic antidepressants like amitriptyline, imipramine, or nortriptyline -certain medicines to control heart rhythm like disopyramide, dofetilide, encainide, moricizine, procainamide, propafenone, and quinidine -cisapride -cyclobenzaprine -delavirdine -droperidol -haloperidol -hawthorn -imatinib -levomethadyl -maprotiline -medicines for malaria like  chloroquine and  halofantrine -pentamidine -phenothiazines like chlorpromazine, mesoridazine, prochlorperazine, thioridazine -pimozide -quinine -ranolazine -ritonavir -sertindole -ziprasidone This medicine may also interact with the following medications: -cimetidine -medicines for angina or high blood pressure -medicines to control heart rhythm like amiodarone and digoxin This list may not describe all possible interactions. Give your health care provider a list of all the medicines, herbs, non-prescription drugs, or dietary supplements you use. Also tell them if you smoke, drink alcohol, or use illegal drugs. Some items may interact with your medicine. What should I watch for while using this medicine? Visit your doctor or health care professional for regular checks on your progress. Because your condition and the use of this medicine carries some risk, it is a good idea to carry an identification card, necklace or bracelet with details of your condition, medications and doctor or health care professional. Check your blood pressure and pulse rate regularly. Ask your health care professional what your blood pressure and pulse rate should be, and when you should contact him or her. Your doctor or health care professional also may schedule regular blood tests and electrocardiograms to check your progress. You may get drowsy or dizzy. Do not drive, use machinery, or do anything that needs mental alertness until you know how this medicine affects you. Do not stand or sit up quickly, especially if you are an older patient. This reduces the risk of dizzy or fainting spells. Alcohol can make you more dizzy, increase flushing and rapid heartbeats. Avoid alcoholic drinks. What side effects may I notice from receiving this medicine? Side effects that you should report to your doctor or health care professional as soon as possible: -chest pain, continued irregular heartbeats -difficulty breathing -swelling of the legs or  feet -trembling, shaking -unusually weak or tired Side effects that usually do not require medical attention (report to your doctor or health care professional if they continue or are bothersome): -blurred vision -constipation -headache -nausea, vomiting -stomach pain This list may not describe all possible side effects. Call your doctor for medical advice about side effects. You may report side effects to FDA at 1-800-FDA-1088. Where should I keep my medicine? Keep out of the reach of children. Store at room temperature between 15 and 30 degrees C (59 and 86 degrees F). Protect from light. Keep container tightly closed. Throw away any unused medicine after the expiration date. NOTE: This sheet is a summary. It may not cover all possible information. If you have questions about this medicine, talk to your doctor, pharmacist, or health care provider.  2018 Elsevier/Gold Standard (2008-04-09 16:46:09)

## 2018-10-22 ENCOUNTER — Ambulatory Visit (INDEPENDENT_AMBULATORY_CARE_PROVIDER_SITE_OTHER): Payer: Medicare Other

## 2018-10-22 ENCOUNTER — Other Ambulatory Visit: Payer: Self-pay | Admitting: Cardiology

## 2018-10-22 DIAGNOSIS — Z5181 Encounter for therapeutic drug level monitoring: Secondary | ICD-10-CM

## 2018-10-22 DIAGNOSIS — Z79899 Other long term (current) drug therapy: Secondary | ICD-10-CM

## 2018-10-22 DIAGNOSIS — I480 Paroxysmal atrial fibrillation: Secondary | ICD-10-CM | POA: Diagnosis not present

## 2018-10-22 DIAGNOSIS — I493 Ventricular premature depolarization: Secondary | ICD-10-CM | POA: Diagnosis not present

## 2018-10-22 LAB — EXERCISE TOLERANCE TEST
Estimated workload: 4.6 METS
Exercise duration (min): 2 min
Exercise duration (sec): 32 s
MPHR: 147 {beats}/min
Peak HR: 98 {beats}/min
Percent HR: 67 %
RPE: 15
Rest HR: 76 {beats}/min

## 2018-10-23 ENCOUNTER — Telehealth: Payer: Self-pay | Admitting: *Deleted

## 2018-10-23 MED ORDER — FLECAINIDE ACETATE 50 MG PO TABS: 50.0000 mg | ORAL_TABLET | Freq: Two times a day (BID) | ORAL | 2 refills | Status: DC

## 2018-10-23 NOTE — Telephone Encounter (Signed)
-----   Message from Will Meredith Leeds, MD sent at 10/23/2018 10:49 AM EST ----- Submaximal exercise treadmill test.  QRS did widen.  Will not tolerate current dose of flecainide.  Would decrease to 50 mg.

## 2018-10-23 NOTE — Telephone Encounter (Signed)
Pt advised to decrease Flecainide to 50 mg BID. Pt agreeable to plan. New Rx sent to pharmacy. Pt will call if she has issues after medication decrease. She appreciates the call and is agreeable to plan.

## 2018-10-26 DIAGNOSIS — Z23 Encounter for immunization: Secondary | ICD-10-CM | POA: Diagnosis not present

## 2018-10-29 ENCOUNTER — Other Ambulatory Visit: Payer: Self-pay | Admitting: Family Medicine

## 2018-10-29 DIAGNOSIS — R221 Localized swelling, mass and lump, neck: Secondary | ICD-10-CM

## 2018-10-31 ENCOUNTER — Ambulatory Visit
Admission: RE | Admit: 2018-10-31 | Discharge: 2018-10-31 | Disposition: A | Discharge status: Home / Self Care | Payer: Medicare Other | Source: Ambulatory Visit | Attending: Family Medicine | Admitting: Family Medicine

## 2018-10-31 DIAGNOSIS — K111 Hypertrophy of salivary gland: Secondary | ICD-10-CM | POA: Diagnosis not present

## 2018-10-31 DIAGNOSIS — R221 Localized swelling, mass and lump, neck: Secondary | ICD-10-CM

## 2018-10-31 MED ORDER — IOPAMIDOL (ISOVUE-300) INJECTION 61%
75.0000 mL | Freq: Once | INTRAVENOUS | Status: AC | PRN
  Administered 2018-10-31: 75 mL via INTRAVENOUS

## 2018-11-20 DIAGNOSIS — H02831 Dermatochalasis of right upper eyelid: Secondary | ICD-10-CM | POA: Diagnosis not present

## 2018-11-20 DIAGNOSIS — H02834 Dermatochalasis of left upper eyelid: Secondary | ICD-10-CM | POA: Diagnosis not present

## 2018-11-20 DIAGNOSIS — H40013 Open angle with borderline findings, low risk, bilateral: Secondary | ICD-10-CM | POA: Diagnosis not present

## 2018-11-20 DIAGNOSIS — Z961 Presence of intraocular lens: Secondary | ICD-10-CM | POA: Diagnosis not present

## 2018-11-26 ENCOUNTER — Other Ambulatory Visit: Payer: Self-pay | Admitting: Cardiovascular Disease

## 2018-11-27 ENCOUNTER — Telehealth: Payer: Self-pay

## 2018-11-27 ENCOUNTER — Ambulatory Visit (HOSPITAL_COMMUNITY)
Admission: RE | Admit: 2018-11-27 | Discharge: 2018-11-27 | Disposition: A | Discharge status: Home / Self Care | Payer: Medicare Other | Source: Ambulatory Visit | Attending: Cardiovascular Disease | Admitting: Cardiovascular Disease

## 2018-11-27 DIAGNOSIS — I6522 Occlusion and stenosis of left carotid artery: Secondary | ICD-10-CM

## 2018-11-27 NOTE — Telephone Encounter (Signed)
Patient aware of results. Per Dr. Johnsie Cancel, Left ICA 60-79% stenosis.  No TIA symptoms.  Continue antiplatelet Rx and F/U carotid duplex in 6 months. Patient verbalized understanding. Will schedule carotid at next office visit.

## 2018-11-27 NOTE — Telephone Encounter (Signed)
-----   Message from Josue Hector, MD sent at 11/27/2018  4:00 PM EST ----- Left ICA 60-79% stenosis.  No TIA symptoms.  Continue antiplatelet Rx and F/U carotid duplex in 6 months

## 2018-11-29 ENCOUNTER — Telehealth: Payer: Self-pay | Admitting: Cardiology

## 2018-11-29 NOTE — Telephone Encounter (Signed)
Spoke to patient who was started on Flecainide 10/24 and about 3 weeks ago developed "prickling" in her L arm.  Occasionally she has it occur in the R arm, but primarily Left.    She is inquiring whether or not this may be causing nerve damage.  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  Please advise, thank you.

## 2018-11-29 NOTE — Telephone Encounter (Signed)
New Message   Pt c/o medication issue:  1. Name of Medication: flecainide (TAMBOCOR) 50 MG tablet  2. How are you currently taking this medication (dosage and times per day)?   3. Are you having a reaction (difficulty breathing--STAT)?   4. What is your medication issue? Prickliness mostly in left arm. She is hoping that it does not cause nerve damage.

## 2018-11-29 NOTE — Telephone Encounter (Signed)
lpmtcb 12/12

## 2018-11-30 NOTE — Telephone Encounter (Signed)
Unlikely due to flecianide.

## 2018-11-30 NOTE — Telephone Encounter (Signed)
Pt states that information she was given was this could be SE of Flecainide.   Made aware that I would speak w/ pharmacist next week and re-discuss w/ Dr. Curt [name].   She is ok with following up next week and understands I will call her. She doesn't want to stop the medication that is working but understands she may have to. She is agreeable to above stated plan.

## 2018-12-04 ENCOUNTER — Ambulatory Visit: Payer: Medicare Other | Admitting: Cardiology

## 2018-12-05 ENCOUNTER — Encounter: Payer: Self-pay | Admitting: Cardiology

## 2018-12-05 NOTE — Telephone Encounter (Signed)
Lysbeth Galas S at 12/05/2018 2:37 PM   Status: Signed     Patient states she is calling in reference to a conversation from 11/30/18 with [name]. Patient states that Venida Jarvis was supposed to talk with Dr Curt Bears and call her back. She has not heard anything and would like a call back.

## 2018-12-05 NOTE — Telephone Encounter (Signed)
This encounter was created in error - please disregard.

## 2018-12-05 NOTE — Telephone Encounter (Signed)
Pt would like to know if paresthesia SE could cause nerve damage.  Pt aware I will investigate possibility/chance. Also gave Propafenone drug name for her to investigate as a possibile alternative to switch to. We agreed to speak more about this tomorrow.

## 2018-12-05 NOTE — Telephone Encounter (Signed)
  Patient states she is calling in reference to a conversation from 11/30/18 with [name]. Patient states that [name] was supposed to talk with Dr Curt Bears and call her back. She has not heard anything and would like a call back.

## 2018-12-06 MED ORDER — PROPAFENONE HCL 225 MG PO TABS: 225.0000 mg | ORAL_TABLET | Freq: Two times a day (BID) | ORAL | 3 refills | Status: DC

## 2018-12-06 NOTE — Telephone Encounter (Signed)
Advised pt that she should not suffer short/long term nerve damage from SE of medication, if her prickling sensation is truly a SE of medication.   I then inquired if she experienced this same sensation when she was on this same medication years ago.  Pt cannot recall being on Flecainide before.  Informed that she did take the medication back in 2013/2014, but unsure of why it was stopped and pt does not know either. To "air on the side of caution" pt advised to stop Flecainide.  Advised to start Propafenone 225 mg BID Saturday night, after 48 hr wash out of Flecainide. Explained that we won't really know if she was experiencing SE from Flecainide until we stop it and see if issues goes away.  Pt agrees. She will call the office back if "prickling" does not go away after stopping the medication (she understands she will need to follow up w/ PCP about prickling if it does not improve) She agreed to call if any SE begins after starting Propafenone. Rx sent to [facility]/[address] per pt request.

## 2018-12-18 NOTE — Progress Notes (Signed)
Cardiology Office Note:    Date:  12/24/2018   ID:  Patricia, [name] 09-09-1946, MRN [id number]  PCP:  Patricia Arabian, MD  Cardiologist:  Patricia Rouge, MD  Electrophysiologist:  Patricia Haw, MD   Referring MD: Patricia Arabian, MD   No chief complaint on file.   History of Present Illness:    [name] is a 72 y.o. female with NICM with improved EF, diastolic HF, HTN, LBBB, hypothyroidism, Hodgkin's disease, breast CA, PAF, carotid artery disease. Patient underwent radiation therapy for Hodgkin's lymphoma in 1998, mastectomy for breast cancer in 2009 and recurrent breast CA in 2013. EF has been as low as 45% in the past. This has improved to normal over time. Flecainide was started in 12/13 for recurrent A. fib. ETT was negative for proarrhythmia. She was admitted in 5/14 with pulmonary edema in the setting of hypertensive crisis complicated by PEA arrest. She was switched to dofetilide.  She continued to have breakthrough atrial fibrillation ultimately underwent PVI ablation with Dr. Curt [name] in July 2018.  She has been admitted several times iwith acute hypoxic respiratory failure.  She has been treated for pneumonia several times.  During her admission in August 2019, she was noted to have prolonged QT.  Her dofetilide was discontinued.  She was last seen in clinic by Patricia Marshall, NP 08/17/2018.  She was maintaining normal sinus rhythm at that time.  She did have a lot of PVCs noted.    F/U with Dr [name] ? Neuropathic pain with flecainide Stopped and propofenone started middle of December 2019 AAT more for PVC suppression than PAF.    Carotid duplex 40/10/27 25-36% LICA  ETT 64/4/03 no QRS widening on flecainide   No cardiac complaints has a H[place]ese puppy at home Maxi   Prior CV studies:   The following studies were reviewed today:  Echocardiogram 05/03/2018 EF 55-60, normal wall motion, grade 2 diastolic dysfunction, mild MR, mild RAE  Holter monitor  01/29/2018 Minimum HR: 69 BPM at 11:24:10 AM Maximum HR: 115 BPM at 5:25:32 PM(2) Average HR: 84 BPM 1% PVCs, <1% APCs Sinus rhythm, and sinus tachycardia seen. 4 beats SVT seen.   Carotid US 11/27/18 Left ICA 60-79% stenosis.  No TIA symptoms.  Continue antiplatelet Rx and F/U carotid duplex in 6 months    Cardiac CT 06/23/17 IMPRESSION: 1) Normal PV;s with no anomaly 2) No LAA thrombus 3) Moderate LAE and mild RA enlargement 4) No ASD/PFO 5) No pericardial effusion 6) Esophagus courses closest to RLPV ostium  Nuclear stress test 05/05/2017 Low risk stress nuclear study with fixed septal defect likely related to LBBB; no ischemia; EF 58 with mild septal hypokinesis.   LHC 5/05 LAD 30%   Past Medical History:  Diagnosis Date  . Anemia   . Arthritis    fingers   . Blood transfusion    1968  . Breast cancer ([place]) 2009   T1N0M0 DCIS right breast  . Breast cancer ([place]) 2013   a. reocurrence right breast;  b. s/p Taxotere, Cytoxan and XRT  . Carotid stenosis    a. dopplers 4/74:  RICA 2-59%, LICA 56-38% => repeat due in 01/2013;  b.  Carotid US (7/56): RICA 4-33%; LICA 29-51% - f/u 6 mos  . Chronic diastolic CHF (congestive heart failure) ([place]) 11/28/2011   Echo 09/18/2018:  EF 55-60, no RWMA, Gr 2 DD, mod MR, mild TR  . Fracture of left patella 12/2016  . History of radiation therapy 07/10/12-08/13/12  right breast/chest wall  . Hodgkin's disease    stage 2b;  s/p XRT 1988  . HTN (hypertension)   . Hypothyroidism    2/2 XRT for Hodgkins Lymphoma  . LBBB (left bundle branch block)   . Mucositis 04/29/2012  . NICM (nonischemic cardiomyopathy) ([place])    a. EF as low as 45%;  b. LHC 5/05:  LM 10%, pLAD 30%, EF 45-50%,   c. Echo 5/13:  EF 60-65%, Gr 1 diast dysfn, MAC;   d.  Echo 11/13:  EF 60-65%, Gr 1 diast dysfn, Tr AI, mild MR;  d.  Echo 05/10/13: EF 47-09%, grade 1 diastolic dysfunction, PASP 36, small effusion.  Marland Kitchen PAF (paroxysmal atrial fibrillation) ([place])    a. dx in  2008;  b. CHADS2-VASc=5;  c. changed to [place] during admx 05/2013; d. Xarelto started 05/2013; e. s/p successful Afib ablation 06/30/2017  . PEA (Pulseless electrical activity) ([place])    a. admx 05/2013 for APE in setting of HTN emergency c/b PEA arrest and asp pneumonia, VDRF, AFib with RVR  . Pneumonia 08/02/2018  . Raynaud's disease   . Status post radiation therapy 1988   mantle and periaortic    Surgical Hx: The patient  has a past surgical history that includes Tonsillectomy and adenoidectomy (1965); Thoractomy (1968); BTL (1971); R vein ligation & stripping (1976); Spleenectomy (1988); Breast lumpectomy (2009); Mastectomy (2009); Polypectomy (1990); Dilation and curettage of uterus; Tissue expander placement; Tissue expander  removal w/ replacement of implant; Mass excision (03/28/2012); Portacath placement (04/16/2012); Port-a-cath removal (10/03/2012); IR RADIOLOGY PERIPHERAL GUIDED IV START (06/23/2017); IR US Guide Vasc Access Left (06/23/2017); ATRIAL FIBRILLATION ABLATION (06/30/2017); and ATRIAL FIBRILLATION ABLATION (N/A, 06/30/2017).   Current Medications: Current Meds  Medication Sig  . acetaminophen (TYLENOL) 325 MG tablet Take 650 mg by mouth at bedtime as needed for moderate pain.  Marland Kitchen albuterol (PROVENTIL HFA;VENTOLIN HFA) 108 (90 Base) MCG/ACT inhaler Inhale 2 puffs into the lungs every 6 (six) hours as needed for wheezing or shortness of breath.  Marland Kitchen amLODipine (NORVASC) 5 MG tablet Take 1 tablet (5 mg total) by mouth daily.  Marland Kitchen atorvastatin (LIPITOR) 10 MG tablet Take 1 tablet (10 mg total) by mouth daily at 6 PM.  . b complex vitamins capsule Take 1 capsule by mouth daily after breakfast.  . calcium carbonate (OS-CAL) 600 MG TABS Take 600 mg by mouth at bedtime.   . carvedilol (COREG) 12.5 MG tablet TAKE 1 TABLET(12.5 MG) BY MOUTH TWICE DAILY WITH A MEAL  . Cholecalciferol (VITAMIN D) 2000 UNITS CAPS Take 2,000 Units by mouth daily with lunch.   . furosemide (LASIX) 40 MG tablet Take  1 tablet (40 mg total) by mouth daily.  . hydrALAZINE (APRESOLINE) 25 MG tablet Take 1 tablet (25 mg total) by mouth 3 (three) times daily.  Marland Kitchen levothyroxine (SYNTHROID, LEVOTHROID) 100 MCG tablet Take 100 mcg by mouth daily at 6 (six) AM.   . Multiple Vitamin (MULTIVITAMIN) tablet Take 1 tablet by mouth daily after breakfast. "Natural Vitamin"  . OVER THE COUNTER MEDICATION BDZ tablets: Take 1 tablet by mouth at bedtime  . OVER THE COUNTER MEDICATION Vegetarian Omega Green: Take 3 capsules by mouth daily with lunch  . propafenone (RYTHMOL) 225 MG tablet Take 1 tablet (225 mg total) by mouth 2 (two) times daily.  . rivaroxaban (XARELTO) 20 MG TABS tablet Take 20 mg by mouth daily with supper.     Allergies:   Ace inhibitors; Benazepril; Epinephrine; Potassium-containing compounds; and Taxotere [docetaxel]  Social History   Tobacco Use  . Smoking status: Never Smoker  . Smokeless tobacco: Never Used  Substance Use Topics  . Alcohol use: No  . Drug use: No     Family Hx: The patient's family history includes Hypertension in her mother. There is no history of Cancer.  ROS:   Please see the history of present illness.    Review of Systems  Respiratory: Positive for shortness of breath.    All other systems reviewed and are negative.   EKGs/Labs/Other Test Reviewed:    EKG:  EKG is  ordered today.  The ekg ordered today demonstrates sinus rhythm, heart rate 83, left axis deviation, QRS 130, QTC 507, frequent PVCs, similar to prior EKGs   Recent Labs: 08/19/2018: B Natriuretic Peptide 374.8 08/22/2018: Magnesium 1.9 10/02/2018: ALT 33; BUN 23; Creatinine 0.87; Hemoglobin 12.2; Platelet Count 305; Potassium 4.6; Sodium 139   Recent Lipid Panel Lab Results  Component Value Date/Time   CHOL 163 05/18/2013 04:40 AM   TRIG 175 (H) 05/18/2013 04:40 AM   HDL 26 (L) 05/18/2013 04:40 AM   CHOLHDL 6.3 05/18/2013 04:40 AM   LDLCALC 102 (H) 05/18/2013 04:40 AM    Physical Exam:    VS:   BP 112/64   Pulse 86   Ht _0  (1.549 m)   Wt 135 lb (61.2 kg)   LMP 03/08/1995   SpO2 96%   BMI 25.51 kg/m     Wt Readings from Last 3 Encounters:  12/24/18 135 lb (61.2 kg)  10/11/18 135 lb (61.2 kg)  10/02/18 133 lb 12.8 oz (60.7 kg)    Affect appropriate Healthy:  appears stated age HEENT: normal Neck supple with no adenopathy JVP normal left bruits no thyromegaly Lungs clear with no wheezing and good diaphragmatic motion Heart:  S1/S2 no murmur, no rub, gallop or click PMI normal Abdomen: benighn, BS positve, no tenderness, no AAA no bruit.  No HSM or HJR Distal pulses intact with no bruits No edema Neuro non-focal Skin warm and dry No muscular weakness   ASSESSMENT & PLAN:    Chronic diastolic CHF (congestive heart failure) (HCC) Acute on chronic heart failure and pneumonia.  Her volume is improved.  She is feeling better.  Continue current dose of diuretic.    NICM (nonischemic cardiomyopathy) ([place]) She has a history of cardiomyopathy with improved LV function.   EF 55-60% TTE 09/18/18 see below regarding Suppressing PVC;s to help improve EF   Paroxysmal atrial fibrillation ([place])  She is status post PVI ablation.  She has maintained sinus rhythm.  Has been taken off tikosyn and flecainide  She remains on Rivaroxaban for anticoagulation.  PVC's (premature ventricular contractions)  She is not that symptomatic with these.  Propofenone started mid December 2019 f/u [place]   Essential hypertension Well controlled.  Continue current medications and low sodium Dash type diet.    Bilateral carotid artery disease, unspecified type ([place]) Left ICA 60-79% stenosis.  No TIA symptoms.  Continue antiplatelet Rx and F/U carotid duplex 11/2019    F/U with EP and f/u with me in 6 months   [name]

## 2018-12-24 ENCOUNTER — Ambulatory Visit (INDEPENDENT_AMBULATORY_CARE_PROVIDER_SITE_OTHER): Payer: Medicare Other | Admitting: Cardiovascular Disease

## 2018-12-24 ENCOUNTER — Encounter: Payer: Self-pay | Admitting: Cardiovascular Disease

## 2018-12-24 VITALS — BP 112/64 | HR 86 | Ht 61.0 in | Wt 135.0 lb

## 2018-12-24 DIAGNOSIS — R0989 Other specified symptoms and signs involving the circulatory and respiratory systems: Secondary | ICD-10-CM | POA: Diagnosis not present

## 2018-12-24 NOTE — Patient Instructions (Addendum)
Medication Instructions:   If you need a refill on your cardiac medications before your next appointment, please call your pharmacy.   Lab work:  If you have labs (blood work) drawn today and your tests are completely normal, you will receive your results only by: Marland Kitchen MyChart Message (if you have MyChart) OR . A paper copy in the mail If you have any lab test that is abnormal or we need to change your treatment, we will call you to review the results.  Testing/Procedures: Your physician has requested that you have a carotid duplex in December. This test is an ultrasound of the carotid arteries in your neck. It looks at blood flow through these arteries that supply the brain with blood. Allow one hour for this exam. There are no restrictions or special instructions.  Follow-Up: At [facility], you and your health needs are our priority.  As part of our continuing mission to provide you with exceptional heart care, we have created designated Provider Care Teams.  These Care Teams include your primary Cardiologist (physician) and Advanced Practice Providers (APPs -  Physician Assistants and Nurse Practitioners) who all work together to provide you with the care you need, when you need it. You will need a follow up appointment in 12 months.  Please call our office 2 months in advance to schedule this appointment.  You may see Jenkins Rouge, MD or one of the following Advanced Practice Providers on your designated Care Team:   Truitt Merle, NP Cecilie Kicks, NP . Kathyrn Drown, NP

## 2018-12-27 ENCOUNTER — Encounter: Payer: Self-pay | Admitting: *Deleted

## 2018-12-27 DIAGNOSIS — Z780 Asymptomatic menopausal state: Secondary | ICD-10-CM | POA: Insufficient documentation

## 2018-12-28 ENCOUNTER — Ambulatory Visit: Payer: Medicare Other | Admitting: Cardiovascular Disease

## 2018-12-28 ENCOUNTER — Other Ambulatory Visit: Payer: Self-pay | Admitting: Cardiovascular Disease

## 2019-01-08 ENCOUNTER — Ambulatory Visit (INDEPENDENT_AMBULATORY_CARE_PROVIDER_SITE_OTHER): Payer: Medicare Other | Admitting: Cardiology

## 2019-01-08 ENCOUNTER — Encounter: Payer: Self-pay | Admitting: Cardiology

## 2019-01-08 VITALS — BP 116/66 | HR 78 | Ht 61.0 in | Wt 134.0 lb

## 2019-01-08 DIAGNOSIS — I493 Ventricular premature depolarization: Secondary | ICD-10-CM

## 2019-01-08 MED ORDER — PROPAFENONE HCL 225 MG PO TABS: 225.0000 mg | ORAL_TABLET | Freq: Two times a day (BID) | ORAL | 3 refills | Status: DC

## 2019-01-08 NOTE — Progress Notes (Signed)
Electrophysiology Office Note   Date:  01/08/2019   ID:  [name], [name] [birth year], MRN [id number]  PCP:  [name], MD  Cardiologist:  Johnsie Cancel Primary Electrophysiologist:  Colinda Barth Meredith Leeds, MD    No chief complaint on file.    History of Present Illness: [name] is a 73 y.o. female who is being seen today for the evaluation of atrial fibrillation at the request of [name], MD. Presenting today for electrophysiology evaluation. Has a history of nonischemic cardio myopathy with an EF that has since improved, diastolic heart failure, hypertension, left bundle branch block, hypothyroidism, Hodgkin's disease, breast cancer, and carotid stenosis.  Radiation therapy for Hodgkin's lymphoma in 1998, mastectomy in 2009 and recurrent breast cancer in 2013. EF 45% in the past which is since improved. Flecainide was started in 2013 for recurrent atrial fibrillation. Treadmill test was negative for arrhythmia. Admitted for pulmonary edema on 5/14 in the setting of hypertensive crisis compensated by PEA arrest. Currently on Tikosyn. Had AF ablation 06/30/17.   Today, denies symptoms of palpitations, chest pain, shortness of breath, orthopnea, PND, lower extremity edema, claudication, dizziness, presyncope, syncope, bleeding, or neurologic sequela. The patient is tolerating medications without difficulties.  He is overall feeling well.  She has no chest pain or shortness of breath.  She is able to do all of her daily activities without issue.  She was recently put on Rythmol for PVCs.  Past Medical History:  Diagnosis Date  . Anemia   . Arthritis    fingers   . Blood transfusion    1968  . Breast cancer ([place]) 2009   T1N0M0 DCIS right breast  . Breast cancer ([place]) 2013   a. reocurrence right breast;  b. s/p Taxotere, Cytoxan and XRT  . Carotid stenosis    a. dopplers 2/68:  RICA 3-41%, LICA 96-22% => repeat due in 01/2013;  b.  Carotid US (2/97): RICA 9-89%; LICA  21-19% - f/u 6 mos  . Chronic diastolic CHF (congestive heart failure) ([place]) 11/28/2011   Echo 09/18/2018:  EF 55-60, no RWMA, Gr 2 DD, mod MR, mild TR  . Fracture of left patella 12/2016  . History of radiation therapy 07/10/12-08/13/12   right breast/chest wall  . Hodgkin's disease    stage 2b;  s/p XRT 1988  . HTN (hypertension)   . Hypothyroidism    2/2 XRT for Hodgkins Lymphoma  . LBBB (left bundle branch block)   . Mucositis 04/29/2012  . NICM (nonischemic cardiomyopathy) ([place])    a. EF as low as 45%;  b. LHC 5/05:  LM 10%, pLAD 30%, EF 45-50%,   c. Echo 5/13:  EF 60-65%, Gr 1 diast dysfn, MAC;   d.  Echo 11/13:  EF 60-65%, Gr 1 diast dysfn, Tr AI, mild MR;  d.  Echo 05/10/13: EF 41-74%, grade 1 diastolic dysfunction, PASP 36, small effusion.  Marland Kitchen PAF (paroxysmal atrial fibrillation) ([place])    a. dx in 2008;  b. CHADS2-VASc=5;  c. changed to [place] during admx 05/2013; d. Xarelto started 05/2013; e. s/p successful Afib ablation 06/30/2017  . PEA (Pulseless electrical activity) ([place])    a. admx 05/2013 for APE in setting of HTN emergency c/b PEA arrest and asp pneumonia, VDRF, AFib with RVR  . Pneumonia 08/02/2018  . Raynaud's disease   . Status post radiation therapy 1988   mantle and periaortic    Past Surgical History:  Procedure Laterality Date  . ATRIAL FIBRILLATION ABLATION  06/30/2017  .  ATRIAL FIBRILLATION ABLATION N/A 06/30/2017   Procedure: Atrial Fibrillation Ablation;  Surgeon: Constance Haw, MD;  Location: [facility];  Service: Cardiovascular;  Laterality: N/A;  . BREAST LUMPECTOMY  2009   right, lymph node biopsy  . BTL  1971  . DILATION AND CURETTAGE OF UTERUS    . IR RADIOLOGY PERIPHERAL GUIDED IV START  06/23/2017  . IR US GUIDE VASC ACCESS LEFT  06/23/2017  . MASS EXCISION  03/28/2012   Procedure: EXCISION MASS;  Surgeon: Edward Jolly, MD;  Location: [facility];  Service: General;  Laterality: Right;  Excision right chest wall mass  .  MASTECTOMY  2009   right breast  . POLYPECTOMY  1990   D&C (also in 1977 and [place])   . PORT-A-CATH REMOVAL  10/03/2012   Procedure: REMOVAL PORT-A-CATH;  Surgeon: Edward Jolly, MD;  Location: [facility];  Service: General;  Laterality: N/A;  . PORTACATH PLACEMENT  04/16/2012   Procedure: INSERTION PORT-A-CATH;  Surgeon: Edward Jolly, MD;  Location: [facility];  Service: General;  Laterality: Left;  Placement of Port-a-Cath, left subclavian  . R vein ligation & stripping  1976  . Spleenectomy  1988   for Hodgkin's disease  . Thoractomy  1968  . TISSUE EXPANDER  REMOVAL W/ REPLACEMENT OF IMPLANT    . TISSUE EXPANDER PLACEMENT     right breast  . TONSILLECTOMY AND ADENOIDECTOMY  1965     Current Outpatient Medications  Medication Sig Dispense Refill  . acetaminophen (TYLENOL) 325 MG tablet Take 650 mg by mouth at bedtime as needed for moderate pain.    Marland Kitchen albuterol (PROVENTIL HFA;VENTOLIN HFA) 108 (90 Base) MCG/ACT inhaler Inhale 2 puffs into the lungs every 6 (six) hours as needed for wheezing or shortness of breath. 1 Inhaler 2  . amLODipine (NORVASC) 5 MG tablet Take 1 tablet (5 mg total) by mouth daily. 90 tablet 3  . atorvastatin (LIPITOR) 10 MG tablet TAKE 1 TABLET BY MOUTH EVERY DAY AT 6PM 90 tablet 3  . b complex vitamins capsule Take 1 capsule by mouth daily after breakfast.    . calcium carbonate (OS-CAL) 600 MG TABS Take 600 mg by mouth at bedtime.     . carvedilol (COREG) 12.5 MG tablet TAKE 1 TABLET(12.5 MG) BY MOUTH TWICE DAILY WITH A MEAL 180 tablet 3  . Cholecalciferol (VITAMIN D) 2000 UNITS CAPS Take 2,000 Units by mouth daily with lunch.     . furosemide (LASIX) 40 MG tablet Take 1 tablet (40 mg total) by mouth daily. 30 tablet 0  . hydrALAZINE (APRESOLINE) 25 MG tablet Take 1 tablet (25 mg total) by mouth 3 (three) times daily. 90 tablet 10  . levothyroxine (SYNTHROID, LEVOTHROID) 100 MCG tablet Take 100 mcg by mouth daily at 6 (six) AM.     . Multiple Vitamin  (MULTIVITAMIN) tablet Take 1 tablet by mouth daily after breakfast. "Natural Vitamin"    . OVER THE COUNTER MEDICATION BDZ tablets: Take 1 tablet by mouth at bedtime    . OVER THE COUNTER MEDICATION Vegetarian Omega Green: Take 3 capsules by mouth daily with lunch    . propafenone (RYTHMOL) 225 MG tablet Take 1 tablet (225 mg total) by mouth 2 (two) times daily. 60 tablet 3  . rivaroxaban (XARELTO) 20 MG TABS tablet Take 20 mg by mouth daily with supper.     No current facility-administered medications for this visit.     Allergies:   Ace inhibitors;  Benazepril; Epinephrine; Potassium-containing compounds; and Taxotere [docetaxel]   Social History:  The patient  reports that she has never smoked. She has never used smokeless tobacco. She reports that she does not drink alcohol or use drugs.   Family History:  The patient's family history includes Hypertension in her mother.   ROS:  Please see the history of present illness.   Otherwise, review of systems is positive for none.   All other systems are reviewed and negative.   PHYSICAL EXAM: VS:  LMP 03/08/1995  , BMI There is no height or weight on file to calculate BMI. GEN: Well nourished, well developed, in no acute distress  HEENT: normal  Neck: no JVD, carotid bruits, or masses Cardiac: RRR; no murmurs, rubs, or gallops,no edema  Respiratory:  clear to auscultation bilaterally, normal work of breathing GI: soft, nontender, nondistended, + BS MS: no deformity or atrophy  Skin: warm and dry Neuro:  Strength and sensation are intact Psych: euthymic mood, full affect  EKG:  EKG is ordered today. Personal review of the ekg ordered shows sinus rhythm, rate 78, IVCD  Recent Labs: 08/19/2018: B Natriuretic Peptide 374.8 08/22/2018: Magnesium 1.9 10/02/2018: ALT 33; BUN 23; Creatinine 0.87; Hemoglobin 12.2; Platelet Count 305; Potassium 4.6; Sodium 139    Lipid Panel     Component Value Date/Time   CHOL 163 05/18/2013 0440   TRIG 175  (H) 05/18/2013 0440   HDL 26 (L) 05/18/2013 0440   CHOLHDL 6.3 05/18/2013 0440   VLDL 35 05/18/2013 0440   LDLCALC 102 (H) 05/18/2013 0440     Wt Readings from Last 3 Encounters:  12/24/18 135 lb (61.2 kg)  10/11/18 135 lb (61.2 kg)  10/02/18 133 lb 12.8 oz (60.7 kg)      Other studies Reviewed: Additional studies/ records that were reviewed today include: TTE 09/18/18 Review of the above records today demonstrates:  - Left ventricle: The cavity size was normal. Systolic function was   normal. The estimated ejection fraction was in the range of 55%   to 60%. Wall motion was normal; there were no regional wall   motion abnormalities. Features are consistent with a pseudonormal   left ventricular filling pattern, with concomitant abnormal   relaxation and increased filling pressure (grade 2 diastolic   dysfunction). - Mitral valve: Mildly thickened leaflets . There was moderate   regurgitation. - Tricuspid valve: There was mild regurgitation.  Holter monitor 02/05/2018-personally reviewed Minimum HR: 69 BPM at 5:34:07 AM Maximum HR: 126 BPM at 1:15:44 PM Average HR: 84 BPM Ventricular Beats: 41760 (35%) Supraventricular Beats: 42 (<1%) Sinus rhythm with sinus tachycardia 17 beats irregular narrow complex tachycardia Diary event of resting with thumps associated with ventricular bigeminy  ASSESSMENT AND PLAN:  1.  Paroxysmal atrial fibrillation: Currently on Coreg, propafenone, and Xarelto.  No known further atrial fibrillation.  No changes. This patients CHA2DS2-VASc Score and unadjusted Ischemic Stroke Rate (% per year) is equal to 3.2 % stroke rate/year from a score of 3  Above score calculated as 1 point each if present [CHF, HTN, DM, Vascular=MI/PAD/Aortic Plaque, Age if 65-74, or Female] Above score calculated as 2 points each if present [Age > 75, or Stroke/TIA/TE]   2. Chronic diastolic heart failure: Blood pressure well controlled.  No signs of volume  overload.  3. Hypertension: Well-controlled today.  4.  PVCs: 35% on cardiac monitor.  In sinus rhythm today.  Currently on propafenone without issue.  Current medicines are reviewed at length with the  patient today.   The patient does not have concerns regarding her medicines.  The following changes were made today: none  Labs/ tests ordered today include:  No orders of the defined types were placed in this encounter.    Disposition:   FU with Robert Sperl 6 months  Signed, Fairley Copher Meredith Leeds, MD  01/08/2019 7:41 AM     [facility] [address] [place] [address] [phone] (office) [phone] (fax)

## 2019-01-08 NOTE — Patient Instructions (Signed)
Medication Instructions:  Your physician recommends that you continue on your current medications as directed. Please refer to the Current Medication list given to you today.  * If you need a refill on your cardiac medications before your next appointment, please call your pharmacy.   Labwork: None ordered *We will only notify you of abnormal results, otherwise continue current treatment plan.  Testing/Procedures: None ordered  Follow-Up: Your physician wants you to follow-up in: 6 months  with Dr. Curt Bears.  You will receive a reminder letter in the mail two months in advance. If you don't receive a letter, please call our office to schedule the follow-up appointment.   Thank you for choosing [facility]!!   [name], RN [phone]

## 2019-01-15 DIAGNOSIS — E7800 Pure hypercholesterolemia, unspecified: Secondary | ICD-10-CM | POA: Diagnosis not present

## 2019-01-15 DIAGNOSIS — I700 Atherosclerosis of aorta: Secondary | ICD-10-CM | POA: Diagnosis not present

## 2019-01-15 DIAGNOSIS — I10 Essential (primary) hypertension: Secondary | ICD-10-CM | POA: Diagnosis not present

## 2019-01-15 DIAGNOSIS — E039 Hypothyroidism, unspecified: Secondary | ICD-10-CM | POA: Diagnosis not present

## 2019-01-15 DIAGNOSIS — M8580 Other specified disorders of bone density and structure, unspecified site: Secondary | ICD-10-CM | POA: Diagnosis not present

## 2019-01-15 DIAGNOSIS — I4891 Unspecified atrial fibrillation: Secondary | ICD-10-CM | POA: Diagnosis not present

## 2019-01-15 DIAGNOSIS — I429 Cardiomyopathy, unspecified: Secondary | ICD-10-CM | POA: Diagnosis not present

## 2019-01-15 DIAGNOSIS — I5032 Chronic diastolic (congestive) heart failure: Secondary | ICD-10-CM | POA: Diagnosis not present

## 2019-01-15 DIAGNOSIS — I493 Ventricular premature depolarization: Secondary | ICD-10-CM | POA: Diagnosis not present

## 2019-03-18 DIAGNOSIS — E039 Hypothyroidism, unspecified: Secondary | ICD-10-CM | POA: Diagnosis not present

## 2019-03-18 DIAGNOSIS — N289 Disorder of kidney and ureter, unspecified: Secondary | ICD-10-CM | POA: Diagnosis not present

## 2019-03-28 ENCOUNTER — Other Ambulatory Visit: Payer: Self-pay | Admitting: Cardiology

## 2019-05-09 DIAGNOSIS — D2261 Melanocytic nevi of right upper limb, including shoulder: Secondary | ICD-10-CM | POA: Diagnosis not present

## 2019-05-09 DIAGNOSIS — Z85828 Personal history of other malignant neoplasm of skin: Secondary | ICD-10-CM | POA: Diagnosis not present

## 2019-05-09 DIAGNOSIS — D2262 Melanocytic nevi of left upper limb, including shoulder: Secondary | ICD-10-CM | POA: Diagnosis not present

## 2019-05-09 DIAGNOSIS — L821 Other seborrheic keratosis: Secondary | ICD-10-CM | POA: Diagnosis not present

## 2019-05-09 DIAGNOSIS — D225 Melanocytic nevi of trunk: Secondary | ICD-10-CM | POA: Diagnosis not present

## 2019-07-15 ENCOUNTER — Other Ambulatory Visit: Payer: Self-pay | Admitting: Cardiology

## 2019-07-15 NOTE — Telephone Encounter (Signed)
Xarelto 20mg  refill request received; pt is 73 yrs old, 43.8kg, Crea-0.87 on 10/02/2018, last seen by Dr. Curt Bears on 01/08/2019, Diagnosis Afib, CrCl-55.22ml/min; will send in refill.

## 2019-07-22 DIAGNOSIS — N289 Disorder of kidney and ureter, unspecified: Secondary | ICD-10-CM | POA: Diagnosis not present

## 2019-07-26 DIAGNOSIS — Z1231 Encounter for screening mammogram for malignant neoplasm of breast: Secondary | ICD-10-CM | POA: Diagnosis not present

## 2019-08-14 ENCOUNTER — Other Ambulatory Visit: Payer: Self-pay | Admitting: Physician Assistant

## 2019-08-31 DIAGNOSIS — Z23 Encounter for immunization: Secondary | ICD-10-CM | POA: Diagnosis not present

## 2019-09-10 ENCOUNTER — Ambulatory Visit (INDEPENDENT_AMBULATORY_CARE_PROVIDER_SITE_OTHER): Payer: Medicare Other | Admitting: Cardiology

## 2019-09-10 ENCOUNTER — Encounter: Payer: Self-pay | Admitting: Cardiology

## 2019-09-10 ENCOUNTER — Other Ambulatory Visit: Payer: Self-pay

## 2019-09-10 VITALS — BP 122/72 | HR 74 | Ht 61.0 in | Wt 132.6 lb

## 2019-09-10 DIAGNOSIS — I480 Paroxysmal atrial fibrillation: Secondary | ICD-10-CM

## 2019-09-10 NOTE — Patient Instructions (Signed)
Medication Instructions:  The current medical regimen is effective;  continue present plan and medications.  If you need a refill on your cardiac medications before your next appointment, please call your pharmacy.   Follow-Up: At [facility], you and your health needs are our priority.  As part of our continuing mission to provide you with exceptional heart care, we have created designated Provider Care Teams.  These Care Teams include your primary Cardiologist (physician) and Advanced Practice Providers (APPs -  Physician Assistants and Nurse Practitioners) who all work together to provide you with the care you need, when you need it. You will need a follow up appointment in 6 months.  Please call our office 2 months in advance to schedule this appointment.  You may see Will Meredith Leeds, MD or one of the following Advanced Practice Providers on your designated Care Team:   Chanetta Marshall, NP . Tommye Standard, PA-C  Thank you for choosing [facility]!!

## 2019-09-10 NOTE — Progress Notes (Signed)
Electrophysiology Office Note   Date:  09/10/2019   ID:  [name], [name] [birth year], MRN [id number]  PCP:  [name], MD  Cardiologist:  Johnsie Cancel Primary Electrophysiologist:  Dearis Danis Meredith Leeds, MD    No chief complaint on file.    History of Present Illness: [name] is a 73 y.o. female who is being seen today for the evaluation of atrial fibrillation at the request of [name], MD. Presenting today for electrophysiology evaluation. Has a history of nonischemic cardio myopathy with an EF that has since improved, diastolic heart failure, hypertension, left bundle branch block, hypothyroidism, Hodgkin's disease, breast cancer, and carotid stenosis.  Radiation therapy for Hodgkin's lymphoma in 1998, mastectomy in 2009 and recurrent breast cancer in 2013. EF 45% in the past which is since improved. Flecainide was started in 2013 for recurrent atrial fibrillation. Treadmill test was negative for arrhythmia. Admitted for pulmonary edema on 5/14 in the setting of hypertensive crisis compensated by PEA arrest. Had AF ablation 06/30/17.  Today, denies symptoms of palpitations, chest pain, shortness of breath, orthopnea, PND, lower extremity edema, claudication, dizziness, presyncope, syncope, bleeding, or neurologic sequela. The patient is tolerating medications without difficulties.  Overall she is doing well.  She has noted no further arrhythmias.  She has no chest pain or shortness of breath.  Past Medical History:  Diagnosis Date  . Anemia   . Arthritis    fingers   . Blood transfusion    1968  . Breast cancer ([place]) 2009   T1N0M0 DCIS right breast  . Breast cancer ([place]) 2013   a. reocurrence right breast;  b. s/p Taxotere, Cytoxan and XRT  . Carotid stenosis    a. dopplers 4/09:  RICA 8-11%, LICA 91-47% => repeat due in 01/2013;  b.  Carotid US (8/29): RICA 5-62%; LICA 13-08% - f/u 6 mos  . Chronic diastolic CHF (congestive heart failure) ([place]) 11/28/2011   Echo 09/18/2018:  EF 55-60, no RWMA, Gr 2 DD, mod MR, mild TR  . Fracture of left patella 12/2016  . History of radiation therapy 07/10/12-08/13/12   right breast/chest wall  . Hodgkin's disease    stage 2b;  s/p XRT 1988  . HTN (hypertension)   . Hypothyroidism    2/2 XRT for Hodgkins Lymphoma  . LBBB (left bundle branch block)   . Mucositis 04/29/2012  . NICM (nonischemic cardiomyopathy) ([place])    a. EF as low as 45%;  b. LHC 5/05:  LM 10%, pLAD 30%, EF 45-50%,   c. Echo 5/13:  EF 60-65%, Gr 1 diast dysfn, MAC;   d.  Echo 11/13:  EF 60-65%, Gr 1 diast dysfn, Tr AI, mild MR;  d.  Echo 05/10/13: EF 65-78%, grade 1 diastolic dysfunction, PASP 36, small effusion.  Marland Kitchen PAF (paroxysmal atrial fibrillation) ([place])    a. dx in 2008;  b. CHADS2-VASc=5;  c. changed to [place] during admx 05/2013; d. Xarelto started 05/2013; e. s/p successful Afib ablation 06/30/2017  . PEA (Pulseless electrical activity) ([place])    a. admx 05/2013 for APE in setting of HTN emergency c/b PEA arrest and asp pneumonia, VDRF, AFib with RVR  . Pneumonia 08/02/2018  . Raynaud's disease   . Status post radiation therapy 1988   mantle and periaortic    Past Surgical History:  Procedure Laterality Date  . ATRIAL FIBRILLATION ABLATION  06/30/2017  . ATRIAL FIBRILLATION ABLATION N/A 06/30/2017   Procedure: Atrial Fibrillation Ablation;  Surgeon: Constance Haw, MD;  Location: [facility];  Service: Cardiovascular;  Laterality: N/A;  . BREAST LUMPECTOMY  2009   right, lymph node biopsy  . BTL  1971  . DILATION AND CURETTAGE OF UTERUS    . IR RADIOLOGY PERIPHERAL GUIDED IV START  06/23/2017  . IR US GUIDE VASC ACCESS LEFT  06/23/2017  . MASS EXCISION  03/28/2012   Procedure: EXCISION MASS;  Surgeon: Edward Jolly, MD;  Location: [facility];  Service: General;  Laterality: Right;  Excision right chest wall mass  . MASTECTOMY  2009   right breast  . POLYPECTOMY  1990   D&C (also in 1977 and [place])   .  PORT-A-CATH REMOVAL  10/03/2012   Procedure: REMOVAL PORT-A-CATH;  Surgeon: Edward Jolly, MD;  Location: [facility];  Service: General;  Laterality: N/A;  . PORTACATH PLACEMENT  04/16/2012   Procedure: INSERTION PORT-A-CATH;  Surgeon: Edward Jolly, MD;  Location: [facility];  Service: General;  Laterality: Left;  Placement of Port-a-Cath, left subclavian  . R vein ligation & stripping  1976  . Spleenectomy  1988   for Hodgkin's disease  . Thoractomy  1968  . TISSUE EXPANDER  REMOVAL W/ REPLACEMENT OF IMPLANT    . TISSUE EXPANDER PLACEMENT     right breast  . TONSILLECTOMY AND ADENOIDECTOMY  1965     Current Outpatient Medications  Medication Sig Dispense Refill  . acetaminophen (TYLENOL) 500 MG tablet Take 500 mg by mouth every 8 (eight) hours as needed for mild pain, moderate pain or headache.    . albuterol (PROVENTIL HFA;VENTOLIN HFA) 108 (90 Base) MCG/ACT inhaler Inhale 2 puffs into the lungs every 6 (six) hours as needed for wheezing or shortness of breath. 1 Inhaler 2  . amLODipine (NORVASC) 5 MG tablet TAKE 1 TABLET BY MOUTH EVERY DAY 90 tablet 1  . atorvastatin (LIPITOR) 10 MG tablet TAKE 1 TABLET BY MOUTH EVERY DAY AT 6PM 90 tablet 3  . b complex vitamins capsule Take 1 capsule by mouth daily after breakfast.    . calcium carbonate (OS-CAL) 600 MG TABS Take 600 mg by mouth at bedtime.     . carvedilol (COREG) 12.5 MG tablet TAKE 1 TABLET(12.5 MG) BY MOUTH TWICE DAILY WITH A MEAL 180 tablet 3  . Cholecalciferol (VITAMIN D) 2000 UNITS CAPS Take 2,000 Units by mouth daily with lunch.     . furosemide (LASIX) 40 MG tablet Take 1 tablet (40 mg total) by mouth daily. 30 tablet 0  . hydrALAZINE (APRESOLINE) 25 MG tablet Take 1 tablet (25 mg total) by mouth 3 (three) times daily. 90 tablet 10  . levothyroxine (SYNTHROID, LEVOTHROID) 100 MCG tablet Take 100 mcg by mouth daily at 6 (six) AM.     . Multiple Vitamin (MULTIVITAMIN) tablet Take 1 tablet by mouth daily after breakfast.  "Natural Vitamin"    . OVER THE COUNTER MEDICATION BDZ tablets: Take 1 tablet by mouth at bedtime    . OVER THE COUNTER MEDICATION Vegetarian Omega Green: Take 3 capsules by mouth daily with lunch    . propafenone (RYTHMOL) 225 MG tablet TAKE 1 TABLET(225 MG) BY MOUTH TWICE DAILY 60 tablet 3  . XARELTO 20 MG TABS tablet TAKE 1 TABLET BY MOUTH DAILY WITH DINNER 90 tablet 1   No current facility-administered medications for this visit.     Allergies:   Ace inhibitors, Benazepril, Epinephrine, Potassium-containing compounds, and Taxotere [docetaxel]   Social History:  The patient  reports that she  has never smoked. She has never used smokeless tobacco. She reports that she does not drink alcohol or use drugs.   Family History:  The patient's family history includes Hypertension in her mother.   ROS:  Please see the history of present illness.   Otherwise, review of systems is positive for none.   All other systems are reviewed and negative.   PHYSICAL EXAM: VS:  BP 122/72   Pulse 74   Ht _0  (1.549 m)   Wt 132 lb 9.6 oz (60.1 kg)   LMP 03/08/1995   SpO2 96%   BMI 25.05 kg/m  , BMI Body mass index is 25.05 kg/m. GEN: Well nourished, well developed, in no acute distress  HEENT: normal  Neck: no JVD, carotid bruits, or masses Cardiac: RRR; no murmurs, rubs, or gallops,no edema  Respiratory:  clear to auscultation bilaterally, normal work of breathing GI: soft, nontender, nondistended, + BS MS: no deformity or atrophy  Skin: warm and dry Neuro:  Strength and sensation are intact Psych: euthymic mood, full affect  EKG:  EKG is ordered today. Personal review of the ekg ordered shows sinus rhythm, right superior axis, QRS 168 ms  Recent Labs: 10/02/2018: ALT 33; BUN 23; Creatinine 0.87; Hemoglobin 12.2; Platelet Count 305; Potassium 4.6; Sodium 139    Lipid Panel     Component Value Date/Time   CHOL 163 05/18/2013 0440   TRIG 175 (H) 05/18/2013 0440   HDL 26 (L) 05/18/2013  0440   CHOLHDL 6.3 05/18/2013 0440   VLDL 35 05/18/2013 0440   LDLCALC 102 (H) 05/18/2013 0440     Wt Readings from Last 3 Encounters:  09/10/19 132 lb 9.6 oz (60.1 kg)  01/08/19 134 lb (60.8 kg)  12/24/18 135 lb (61.2 kg)      Other studies Reviewed: Additional studies/ records that were reviewed today include: TTE 09/18/18 Review of the above records today demonstrates:  - Left ventricle: The cavity size was normal. Systolic function was   normal. The estimated ejection fraction was in the range of 55%   to 60%. Wall motion was normal; there were no regional wall   motion abnormalities. Features are consistent with a pseudonormal   left ventricular filling pattern, with concomitant abnormal   relaxation and increased filling pressure (grade 2 diastolic   dysfunction). - Mitral valve: Mildly thickened leaflets . There was moderate   regurgitation. - Tricuspid valve: There was mild regurgitation.  Holter monitor 02/05/2018-personally reviewed Minimum HR: 69 BPM at 5:34:07 AM Maximum HR: 126 BPM at 1:15:44 PM Average HR: 84 BPM Ventricular Beats: 41760 (35%) Supraventricular Beats: 42 (<1%) Sinus rhythm with sinus tachycardia 17 beats irregular narrow complex tachycardia Diary event of resting with thumps associated with ventricular bigeminy  ASSESSMENT AND PLAN:  1.  Paroxysmal atrial fibrillation: Currently on Coreg, propafenone, Xarelto.  Currently feeling well without major complaints.  Her QRS has widened a little bit on her propafenone.  We Ramal Eckhardt get an ECG at her next visit to further evaluate.  This patients CHA2DS2-VASc Score and unadjusted Ischemic Stroke Rate (% per year) is equal to 3.2 % stroke rate/year from a score of 3  Above score calculated as 1 point each if present [CHF, HTN, DM, Vascular=MI/PAD/Aortic Plaque, Age if 65-74, or Female] Above score calculated as 2 points each if present [Age > 75, or Stroke/TIA/TE]   2. Chronic diastolic heart failure:  Blood pressure well controlled.  No signs of volume overload.  3. Hypertension: Well-controlled  4.  PVCs: 35% on cardiac monitor.  In sinus rhythm.  No PVCs on ECG today  Current medicines are reviewed at length with the patient today.   The patient does not have concerns regarding her medicines.  The following changes were made today: None  Labs/ tests ordered today include:  No orders of the defined types were placed in this encounter.    Disposition:   FU with Toshia Larkin 6 months  Signed, Fusaye Wachtel Meredith Leeds, MD  09/10/2019 11:11 AM     [facility] [address] [place] [address] [phone] (office) [phone] (fax)

## 2019-09-24 ENCOUNTER — Telehealth: Payer: Self-pay | Admitting: Hematology and Oncology

## 2019-09-24 NOTE — Telephone Encounter (Signed)
Returned patient's phone call regarding rescheduling 10/15 appointment, per patient's request appointment has moved to 10/26.

## 2019-10-03 ENCOUNTER — Ambulatory Visit: Payer: Medicare Other | Admitting: Hematology and Oncology

## 2019-10-13 NOTE — Progress Notes (Signed)
Patient Care Team: [name], MD as PCP - General Curt Bears, Ocie Doyne, MD as PCP - Electrophysiology (Cardiology) Josue Hector, MD as PCP - Cardiology (Cardiology)  DIAGNOSIS:    ICD-10-CM   1. Chest wall recurrence of right breast cancer [facility])  C50.911    C79.89     CHIEF COMPLIANT: Follow-up of breast cancer  INTERVAL HISTORY: [name] is a 73 y.o. with above-mentioned history of breast cancer and Hodgkin's lymphoma. I last saw her a year ago. She presents to the clinic today for follow-up.  This past year her health has been much better.  She had no further problems with heart or lung function.  REVIEW OF SYSTEMS:   Constitutional: Denies fevers, chills or abnormal weight loss Eyes: Denies blurriness of vision Ears, nose, mouth, throat, and face: Denies mucositis or sore throat Respiratory: Denies cough, dyspnea or wheezes Cardiovascular: Denies palpitation, chest discomfort Gastrointestinal: Denies nausea, heartburn or change in bowel habits Skin: Denies abnormal skin rashes Lymphatics: Denies new lymphadenopathy or easy bruising Neurological: Denies numbness, tingling or new weaknesses Behavioral/Psych: Mood is stable, no new changes  Extremities: No lower extremity edema Breast: denies any pain or lumps or nodules in either breasts All other systems were reviewed with the patient and are negative.  I have reviewed the past medical history, past surgical history, social history and family history with the patient and they are unchanged from previous note.  ALLERGIES:  is allergic to ace inhibitors; benazepril; epinephrine; potassium-containing compounds; and taxotere [docetaxel].  MEDICATIONS:  Current Outpatient Medications  Medication Sig Dispense Refill  . acetaminophen (TYLENOL) 500 MG tablet Take 500 mg by mouth every 8 (eight) hours as needed for mild pain, moderate pain or headache.    . albuterol (PROVENTIL HFA;VENTOLIN HFA) 108 (90 Base)  MCG/ACT inhaler Inhale 2 puffs into the lungs every 6 (six) hours as needed for wheezing or shortness of breath. 1 Inhaler 2  . amLODipine (NORVASC) 5 MG tablet TAKE 1 TABLET BY MOUTH EVERY DAY 90 tablet 1  . atorvastatin (LIPITOR) 10 MG tablet TAKE 1 TABLET BY MOUTH EVERY DAY AT 6PM 90 tablet 3  . b complex vitamins capsule Take 1 capsule by mouth daily after breakfast.    . calcium carbonate (OS-CAL) 600 MG TABS Take 600 mg by mouth at bedtime.     . carvedilol (COREG) 12.5 MG tablet TAKE 1 TABLET(12.5 MG) BY MOUTH TWICE DAILY WITH A MEAL 180 tablet 3  . Cholecalciferol (VITAMIN D) 2000 UNITS CAPS Take 2,000 Units by mouth daily with lunch.     . furosemide (LASIX) 40 MG tablet Take 1 tablet (40 mg total) by mouth daily. 30 tablet 0  . hydrALAZINE (APRESOLINE) 25 MG tablet Take 1 tablet (25 mg total) by mouth 3 (three) times daily. 90 tablet 10  . levothyroxine (SYNTHROID, LEVOTHROID) 100 MCG tablet Take 100 mcg by mouth daily at 6 (six) AM.     . Multiple Vitamin (MULTIVITAMIN) tablet Take 1 tablet by mouth daily after breakfast. "Natural Vitamin"    . OVER THE COUNTER MEDICATION BDZ tablets: Take 1 tablet by mouth at bedtime    . OVER THE COUNTER MEDICATION Vegetarian Omega Green: Take 3 capsules by mouth daily with lunch    . propafenone (RYTHMOL) 225 MG tablet TAKE 1 TABLET(225 MG) BY MOUTH TWICE DAILY 60 tablet 3  . XARELTO 20 MG TABS tablet TAKE 1 TABLET BY MOUTH DAILY WITH DINNER 90 tablet 1   No current facility-administered  medications for this visit.     PHYSICAL EXAMINATION: ECOG PERFORMANCE STATUS: 1 - Symptomatic but completely ambulatory  Vitals:   10/14/19 1433  BP: (!) 124/55  Pulse: 82  Resp: 17  Temp: 98.7 F (37.1 C)  SpO2: 98%   Filed Weights   10/14/19 1433  Weight: 137 lb 11.2 oz (62.5 kg)    GENERAL: alert, no distress and comfortable SKIN: skin color, texture, turgor are normal, no rashes or significant lesions EYES: normal, Conjunctiva are pink and  non-injected, sclera clear OROPHARYNX: no exudate, no erythema and lips, buccal mucosa, and tongue normal  NECK: supple, thyroid normal size, non-tender, without nodularity LYMPH: no palpable lymphadenopathy in the cervical, axillary or inguinal LUNGS: clear to auscultation and percussion with normal breathing effort HEART: regular rate & rhythm and no murmurs and no lower extremity edema ABDOMEN: abdomen soft, non-tender and normal bowel sounds MUSCULOSKELETAL: no cyanosis of digits and no clubbing  NEURO: alert & oriented x 3 with fluent speech, no focal motor/sensory deficits EXTREMITIES: No lower extremity edema BREAST: No palpable masses or nodules in either right or left breasts. No palpable axillary supraclavicular or infraclavicular adenopathy no breast tenderness or nipple discharge. (exam performed in the presence of a chaperone)  LABORATORY DATA:  I have reviewed the data as listed CMP Latest Ref Rng & Units 10/02/2018 08/29/2018 08/24/2018  Glucose 70 - 99 mg/dL 96 95 128(H)  BUN 8 - 23 mg/dL 23 23 33(H)  Creatinine 0.44 - 1.00 mg/dL 0.87 0.73 0.81  Sodium 135 - 145 mmol/L 139 139 140  Potassium 3.5 - 5.1 mmol/L 4.6 4.2 3.6  Chloride 98 - 111 mmol/L 99 96 97(L)  CO2 22 - 32 mmol/L 32 27 31  Calcium 8.9 - 10.3 mg/dL 9.9 9.5 9.2  Total Protein 6.5 - 8.1 g/dL 6.8 - -  Total Bilirubin 0.3 - 1.2 mg/dL 0.6 - -  Alkaline Phos 38 - 126 U/L 92 - -  AST 15 - 41 U/L 28 - -  ALT 0 - 44 U/L 33 - -    Lab Results  Component Value Date   WBC 9.6 10/02/2018   HGB 12.2 10/02/2018   HCT 37.6 10/02/2018   MCV 92.6 10/02/2018   PLT 305 10/02/2018   NEUTROABS 6.9 10/02/2018    ASSESSMENT & PLAN:  Chest wall recurrence of breast cancer (HCC) Cancer of Breast T1bN0M0 triple neg S/P mastectomy/reconstruction 04/2008 Recurrent right breast triple negative cancer: Originally diagnosed with Hodgkin lymphoma in 1987 treated with mantle radiation. she developed breast cancer in 2009 underwent  right breast mastectomy for a T1 B. N0 M0 stage IA triple negative breast cancer. In 2013, patient developed chest wall recurrence that was resected. She is currently being followed by mammograms every year and breast MRIs every other year.  Breast Cancer Surveillance: 1. Breast exam10/26/2020: Normal, right breast reconstructed, left breast no palpable lumps or nodules 2. MammogramDone atDr. Alan Ripper office will get a copy 3. MRI breast: 08/04/2017 benign  S/P Postsplenectomy.  Hodgkin's lymphoma: Diagnosed 29years ago and is in remission after mantle radiation  Patient's husband died in 2033from lung cancer.  hospitalization for CHF and acute respiratory failure and H CAP: 08/19/2018 to 08/24/2018 Return to clinic in 1 year for follow-up.      No orders of the defined types were placed in this encounter.  The patient has a good understanding of the overall plan. she agrees with it. she will call with any problems that may develop before the  next visit here.  Nicholas Lose, MD 10/14/2019  Julious Oka Dorshimer am acting as scribe for Dr. Nicholas Lose.  I have reviewed the above documentation for accuracy and completeness, and I agree with the above.

## 2019-10-14 ENCOUNTER — Other Ambulatory Visit: Payer: Self-pay

## 2019-10-14 ENCOUNTER — Inpatient Hospital Stay: Payer: Medicare Other | Attending: Hematology and Oncology | Admitting: Hematology and Oncology

## 2019-10-14 DIAGNOSIS — C7989 Secondary malignant neoplasm of other specified sites: Secondary | ICD-10-CM | POA: Diagnosis not present

## 2019-10-14 DIAGNOSIS — Z853 Personal history of malignant neoplasm of breast: Secondary | ICD-10-CM | POA: Insufficient documentation

## 2019-10-14 DIAGNOSIS — Z9011 Acquired absence of right breast and nipple: Secondary | ICD-10-CM | POA: Insufficient documentation

## 2019-10-14 DIAGNOSIS — C50911 Malignant neoplasm of unspecified site of right female breast: Secondary | ICD-10-CM | POA: Diagnosis not present

## 2019-10-14 DIAGNOSIS — Z8571 Personal history of Hodgkin lymphoma: Secondary | ICD-10-CM | POA: Diagnosis not present

## 2019-10-14 DIAGNOSIS — Z923 Personal history of irradiation: Secondary | ICD-10-CM | POA: Insufficient documentation

## 2019-10-14 DIAGNOSIS — Z9081 Acquired absence of spleen: Secondary | ICD-10-CM | POA: Insufficient documentation

## 2019-10-14 NOTE — Assessment & Plan Note (Signed)
Cancer of Breast T1bN0M0 triple neg S/P mastectomy/reconstruction 04/2008 Recurrent right breast triple negative cancer: Originally diagnosed with Hodgkin lymphoma in 1987 treated with mantle radiation. she developed breast cancer in 2009 underwent right breast mastectomy for a T1 B. N0 M0 stage IA triple negative breast cancer. In 2013, patient developed chest wall recurrence that was resected. She is currently being followed by mammograms every year and breast MRIs every other year.  Breast Cancer Surveillance: 1. Breast exam10/26/2020: Normal, right breast reconstructed, left breast no palpable lumps or nodules 2. MammogramDone atDr. Alan Ripper office will get a copy 3. MRI breast: 08/04/2017 benign  S/P Postsplenectomy.  Hodgkin's lymphoma: Diagnosed 29years ago and is in remission after mantle radiation  Patient's husband died in 2049from lung cancer.  hospitalization for CHF and acute respiratory failure and H CAP: 08/19/2018 to 08/24/2018 Return to clinic in 1 year for follow-up.

## 2019-10-15 ENCOUNTER — Telehealth: Payer: Self-pay | Admitting: Hematology and Oncology

## 2019-10-15 NOTE — Telephone Encounter (Signed)
I talk with patient regarding schedule  

## 2019-11-12 ENCOUNTER — Other Ambulatory Visit: Payer: Self-pay | Admitting: Cardiovascular Disease

## 2019-11-12 MED ORDER — CARVEDILOL 12.5 MG PO TABS: ORAL_TABLET | ORAL | 0 refills | Status: DC

## 2019-11-29 ENCOUNTER — Other Ambulatory Visit: Payer: Self-pay

## 2019-11-29 ENCOUNTER — Other Ambulatory Visit (HOSPITAL_COMMUNITY): Payer: Self-pay | Admitting: Cardiovascular Disease

## 2019-11-29 ENCOUNTER — Ambulatory Visit (HOSPITAL_COMMUNITY)
Admission: RE | Admit: 2019-11-29 | Discharge: 2019-11-29 | Disposition: A | Discharge status: Home / Self Care | Payer: Medicare Other | Source: Ambulatory Visit | Attending: Cardiovascular Disease | Admitting: Cardiovascular Disease

## 2019-11-29 DIAGNOSIS — I6523 Occlusion and stenosis of bilateral carotid arteries: Secondary | ICD-10-CM

## 2019-11-29 DIAGNOSIS — R0989 Other specified symptoms and signs involving the circulatory and respiratory systems: Secondary | ICD-10-CM | POA: Insufficient documentation

## 2019-12-04 ENCOUNTER — Telehealth: Payer: Self-pay

## 2019-12-04 DIAGNOSIS — I6522 Occlusion and stenosis of left carotid artery: Secondary | ICD-10-CM

## 2019-12-04 NOTE — Telephone Encounter (Signed)
Patient aware of results. Will place order for carotids in 6 months.

## 2019-12-04 NOTE — Telephone Encounter (Signed)
-----   Message from Josue Hector, MD sent at 11/29/2019 11:57 AM EST ----- Left ICA 60-79% stenosis.  No TIA symptoms.  Continue antiplatelet Rx and F/U carotid duplex in 6 months

## 2019-12-25 NOTE — Progress Notes (Signed)
Cardiology Office Note:    Date:  01/07/2020   ID:  [name], [name] 10-11-1946, MRN [id number]  PCP:  [name], MD  Cardiologist:  Jenkins Rouge, MD  Electrophysiologist:  Constance Haw, MD   Referring MD: [name], MD   No chief complaint on file.   History of Present Illness:    [name] is a 74 y.o. female with NICM with improved EF, diastolic HF, HTN, LBBB, hypothyroidism, Hodgkin's disease, breast CA, PAF, carotid artery disease. Patient underwent radiation therapy for Hodgkin's lymphoma in 1998, mastectomy for breast cancer in 2009 and recurrent breast CA in 2013. EF has been as low as 45% in the past. This has improved to normal over time. Flecainide was started in 12/13 for recurrent A. fib. ETT was negative for proarrhythmia. She was admitted in 5/14 with pulmonary edema in the setting of hypertensive crisis complicated by PEA arrest. She was switched to dofetilide.  She continued to have breakthrough atrial fibrillation ultimately underwent PVI ablation with Dr. Curt Bears in July 2018.  She has been admitted several times iwith acute hypoxic respiratory failure.  She has been treated for pneumonia several times.  During her admission in August 2019, she was noted to have prolonged QT.  Her dofetilide was discontinued.  She was last seen by Dr Curt Bears 09/10/19  ? Neuropathic pain with flecainide Stopped and propofenone started middle of December 2019 AAT more for PVC suppression than PAF.  ECG 09/10/19 with some QRS widening 168 msec IVCD sinus rate 77 RSAxis   Carotid duplex 91/47/82  95-62% LICA  ETT 13/0/86 no QRS widening on flecainide   No cardiac complaints has a Havanese puppy at home Maxi   Has a puppy Havenese that she walks regularly   Prior CV studies:   The following studies were reviewed today:  Echocardiogram 05/03/2018 EF 55-60, normal wall motion, grade 2 diastolic dysfunction, mild MR, mild RAE  Holter monitor 01/29/2018 Minimum HR:  69 BPM at 11:24:10 AM Maximum HR: 115 BPM at 5:25:32 PM(2) Average HR: 84 BPM 1% PVCs, <1% APCs Sinus rhythm, and sinus tachycardia seen. 4 beats SVT seen.   Carotid US 11/27/18 Left ICA 60-79% stenosis.  No TIA symptoms.  Continue antiplatelet Rx and F/U carotid duplex in 6 months    Cardiac CT 06/23/17 IMPRESSION: 1) Normal PV;s with no anomaly 2) No LAA thrombus 3) Moderate LAE and mild RA enlargement 4) No ASD/PFO 5) No pericardial effusion 6) Esophagus courses closest to RLPV ostium  Nuclear stress test 05/05/2017 Low risk stress nuclear study with fixed septal defect likely related to LBBB; no ischemia; EF 58 with mild septal hypokinesis.   LHC 5/05 LAD 30%   Past Medical History:  Diagnosis Date  . Anemia   . Arthritis    fingers   . Blood transfusion    1968  . Breast cancer ([place]) 2009   T1N0M0 DCIS right breast  . Breast cancer ([place]) 2013   a. reocurrence right breast;  b. s/p Taxotere, Cytoxan and XRT  . Carotid stenosis    a. dopplers 5/78:  RICA 4-69%, LICA 62-95% => repeat due in 01/2013;  b.  Carotid US (2/84): RICA 1-32%; LICA 44-01% - f/u 6 mos  . Chronic diastolic CHF (congestive heart failure) ([place]) 11/28/2011   Echo 09/18/2018:  EF 55-60, no RWMA, Gr 2 DD, mod MR, mild TR  . Fracture of left patella 12/2016  . History of radiation therapy 07/10/12-08/13/12   right breast/chest  wall  . Hodgkin's disease    stage 2b;  s/p XRT 1988  . HTN (hypertension)   . Hypothyroidism    2/2 XRT for Hodgkins Lymphoma  . LBBB (left bundle branch block)   . Mucositis 04/29/2012  . NICM (nonischemic cardiomyopathy) ([place])    a. EF as low as 45%;  b. LHC 5/05:  LM 10%, pLAD 30%, EF 45-50%,   c. Echo 5/13:  EF 60-65%, Gr 1 diast dysfn, MAC;   d.  Echo 11/13:  EF 60-65%, Gr 1 diast dysfn, Tr AI, mild MR;  d.  Echo 05/10/13: EF 40-81%, grade 1 diastolic dysfunction, PASP 36, small effusion.  Marland Kitchen PAF (paroxysmal atrial fibrillation) ([place])    a. dx in 2008;  b. CHADS2-VASc=5;   c. changed to [place] during admx 05/2013; d. Xarelto started 05/2013; e. s/p successful Afib ablation 06/30/2017  . PEA (Pulseless electrical activity) ([place])    a. admx 05/2013 for APE in setting of HTN emergency c/b PEA arrest and asp pneumonia, VDRF, AFib with RVR  . Pneumonia 08/02/2018  . Raynaud's disease   . Status post radiation therapy 1988   mantle and periaortic    Surgical Hx: The patient  has a past surgical history that includes Tonsillectomy and adenoidectomy (1965); Thoractomy (1968); BTL (1971); R vein ligation & stripping (1976); Spleenectomy (1988); Breast lumpectomy (2009); Mastectomy (2009); Polypectomy (1990); Dilation and curettage of uterus; Tissue expander placement; Tissue expander  removal w/ replacement of implant; Mass excision (03/28/2012); Portacath placement (04/16/2012); Port-a-cath removal (10/03/2012); IR RADIOLOGY PERIPHERAL GUIDED IV START (06/23/2017); IR US Guide Vasc Access Left (06/23/2017); ATRIAL FIBRILLATION ABLATION (06/30/2017); and ATRIAL FIBRILLATION ABLATION (N/A, 06/30/2017).   Current Medications: Current Meds  Medication Sig  . acetaminophen (TYLENOL) 500 MG tablet Take 500 mg by mouth every 8 (eight) hours as needed for mild pain, moderate pain or headache.  Marland Kitchen amLODipine (NORVASC) 5 MG tablet TAKE 1 TABLET BY MOUTH EVERY DAY  . atorvastatin (LIPITOR) 10 MG tablet TAKE 1 TABLET BY MOUTH EVERY DAY AT 6PM  . b complex vitamins capsule Take 1 capsule by mouth daily after breakfast.  . calcium carbonate (OS-CAL) 600 MG TABS Take 600 mg by mouth at bedtime.   . carvedilol (COREG) 12.5 MG tablet TAKE 1 TABLET(12.5 MG) BY MOUTH TWICE DAILY WITH A MEAL. Please make yearly appt with Dr. Johnsie Cancel for January for future refills. 1st attempt  . Cholecalciferol (VITAMIN D) 2000 UNITS CAPS Take 2,000 Units by mouth daily with lunch.   . furosemide (LASIX) 40 MG tablet Take 1 tablet (40 mg total) by mouth daily.  . hydrALAZINE (APRESOLINE) 25 MG tablet Take 1 tablet (25  mg total) by mouth 3 (three) times daily.  Marland Kitchen levothyroxine (SYNTHROID, LEVOTHROID) 100 MCG tablet Take 100 mcg by mouth daily at 6 (six) AM.   . Multiple Vitamin (MULTIVITAMIN) tablet Take 1 tablet by mouth daily after breakfast. "Natural Vitamin"  . OVER THE COUNTER MEDICATION BDZ tablets: Take 1 tablet by mouth at bedtime  . OVER THE COUNTER MEDICATION Vegetarian Omega Green: Take 3 capsules by mouth daily with lunch  . propafenone (RYTHMOL) 225 MG tablet TAKE 1 TABLET(225 MG) BY MOUTH TWICE DAILY  . UNABLE TO FIND PB assist 2 daily  . UNABLE TO FIND terrazyme two with meals  . XARELTO 20 MG TABS tablet TAKE 1 TABLET BY MOUTH DAILY WITH DINNER     Allergies:   Ace inhibitors, Benazepril, Epinephrine, Potassium-containing compounds, and Taxotere [docetaxel]   Social  History   Tobacco Use  . Smoking status: Never Smoker  . Smokeless tobacco: Never Used  Substance Use Topics  . Alcohol use: No  . Drug use: No     Family Hx: The patient's family history includes Hypertension in her mother. There is no history of Cancer.  ROS:   Please see the history of present illness.    Review of Systems  Respiratory: Positive for shortness of breath.    All other systems reviewed and are negative.   EKGs/Labs/Other Test Reviewed:    EKG:   9/22 SR rate 74 QRS 168 msec IVCD right superior axis  Recent Labs: No results found for requested labs within last 8760 hours.   Recent Lipid Panel Lab Results  Component Value Date/Time   CHOL 163 05/18/2013 04:40 AM   TRIG 175 (H) 05/18/2013 04:40 AM   HDL 26 (L) 05/18/2013 04:40 AM   CHOLHDL 6.3 05/18/2013 04:40 AM   LDLCALC 102 (H) 05/18/2013 04:40 AM    Physical Exam:    VS:  BP 116/62   Pulse 72   Ht _0  (1.549 m)   Wt 139 lb (63 kg)   LMP 03/08/1995   SpO2 96%   BMI 26.26 kg/m     Wt Readings from Last 3 Encounters:  01/07/20 139 lb (63 kg)  10/14/19 137 lb 11.2 oz (62.5 kg)  09/10/19 132 lb 9.6 oz (60.1 kg)    Affect  appropriate Healthy:  appears stated age HEENT: normal Neck supple with no adenopathy JVP normal left bruits no thyromegaly Lungs clear with no wheezing and good diaphragmatic motion Heart:  S1/S2 no murmur, no rub, gallop or click PMI normal Abdomen: benighn, BS positve, no tenderness, no AAA no bruit.  No HSM or HJR Distal pulses intact with no bruits No edema Neuro non-focal Skin warm and dry No muscular weakness   ASSESSMENT & PLAN:    Chronic diastolic CHF (congestive heart failure) (HCC) Acute on chronic heart failure and pneumonia.  Her volume is improved.  She is feeling better.  Continue current dose of diuretic.    NICM (nonischemic cardiomyopathy) ([place]) She has a history of cardiomyopathy with improved LV function.   EF 55-60% TTE 09/18/18 see below regarding Suppressing PVC;s to help improve EF   Paroxysmal atrial fibrillation ([place])  She is status post PVI ablation.  She has maintained sinus rhythm.  Has been taken off tikosyn and flecainide Currently on propafenone   She remains on Rivaroxaban for anticoagulation.  PVC's (premature ventricular contractions)  She is not that symptomatic with these.  Propofenone started mid December 2019 f/u Camnitz ECG    Essential hypertension Well controlled.  Continue current medications and low sodium Dash type diet.    Bilateral carotid artery disease, unspecified type ([place]) Left ICA 60-79% stenosis.  No TIA symptoms.  Continue antiplatelet Rx and F/U carotid duplex 11/2020   F/U with EP 3 months  and f/u with me in 6 months   Jenkins Rouge

## 2020-01-07 ENCOUNTER — Encounter: Payer: Self-pay | Admitting: Cardiovascular Disease

## 2020-01-07 ENCOUNTER — Other Ambulatory Visit: Payer: Self-pay

## 2020-01-07 ENCOUNTER — Ambulatory Visit (INDEPENDENT_AMBULATORY_CARE_PROVIDER_SITE_OTHER): Payer: Medicare Other | Admitting: Cardiovascular Disease

## 2020-01-07 VITALS — BP 116/62 | HR 72 | Ht 61.0 in | Wt 139.0 lb

## 2020-01-07 DIAGNOSIS — I480 Paroxysmal atrial fibrillation: Secondary | ICD-10-CM | POA: Diagnosis not present

## 2020-01-07 NOTE — Patient Instructions (Signed)
Medication Instructions:   *If you need a refill on your cardiac medications before your next appointment, please call your pharmacy*  Lab Work:  If you have labs (blood work) drawn today and your tests are completely normal, you will receive your results only by: Marland Kitchen MyChart Message (if you have MyChart) OR . A paper copy in the mail If you have any lab test that is abnormal or we need to change your treatment, we will call you to review the results.  Testing/Procedures: Your physician has requested that you have a carotid duplex in June. This test is an ultrasound of the carotid arteries in your neck. It looks at blood flow through these arteries that supply the brain with blood. Allow one hour for this exam. There are no restrictions or special instructions.  Follow-Up: At [facility], you and your health needs are our priority.  As part of our continuing mission to provide you with exceptional heart care, we have created designated Provider Care Teams.  These Care Teams include your primary Cardiologist (physician) and Advanced Practice Providers (APPs -  Physician Assistants and Nurse Practitioners) who all work together to provide you with the care you need, when you need it.  Your next appointment:   2 month(s)  The format for your next appointment:   In Person  Provider:   You may see Will Meredith Leeds, MD or one of the following Advanced Practice Providers on your designated Care Team:    Chanetta Marshall, NP  Tommye Standard, [place]  Legrand Como "Oda Kilts, [place]  Your physician wants you to follow-up in: 6 month with Dr. Johnsie Cancel. You will receive a reminder letter in the mail two months in advance. If you don't receive a letter, please call our office to schedule the follow-up appointment.

## 2020-01-09 MED ORDER — ATORVASTATIN CALCIUM 10 MG PO TABS: ORAL_TABLET | ORAL | 3 refills | Status: DC

## 2020-01-11 ENCOUNTER — Other Ambulatory Visit: Payer: Self-pay | Admitting: Cardiology

## 2020-01-14 NOTE — Telephone Encounter (Addendum)
Xarelto 20mg  refill request received. Pt is 74 years old, weight-63kg, Crea-0.98 on 07/22/2019 via scanned labs from [facility] PCP, last seen by Dr. Johnsie Cancel on 01/07/2020 & an appt with Dr. Curt Bears on 03/03/2020, Diagnosis-Afib, CrCl-50.4ml/min previous was 55.68ml/min. After speaking with Pharmacist Jinny Blossom, will have labs redrawn since she has an office appt with Dr. Curt Bears on 03/03/2020, then reevaluate dose. Will place a note on the appt and send in a 3 month supply.

## 2020-01-17 ENCOUNTER — Ambulatory Visit: Payer: Medicare Other

## 2020-01-21 ENCOUNTER — Other Ambulatory Visit: Payer: Self-pay | Admitting: Family Medicine

## 2020-01-21 DIAGNOSIS — Z0000 Encounter for general adult medical examination without abnormal findings: Secondary | ICD-10-CM | POA: Diagnosis not present

## 2020-01-21 DIAGNOSIS — I5032 Chronic diastolic (congestive) heart failure: Secondary | ICD-10-CM | POA: Diagnosis not present

## 2020-01-21 DIAGNOSIS — R040 Epistaxis: Secondary | ICD-10-CM | POA: Diagnosis not present

## 2020-01-21 DIAGNOSIS — M8580 Other specified disorders of bone density and structure, unspecified site: Secondary | ICD-10-CM | POA: Diagnosis not present

## 2020-01-21 DIAGNOSIS — E039 Hypothyroidism, unspecified: Secondary | ICD-10-CM | POA: Diagnosis not present

## 2020-01-21 DIAGNOSIS — E7800 Pure hypercholesterolemia, unspecified: Secondary | ICD-10-CM | POA: Diagnosis not present

## 2020-01-21 DIAGNOSIS — I429 Cardiomyopathy, unspecified: Secondary | ICD-10-CM | POA: Diagnosis not present

## 2020-01-21 DIAGNOSIS — I4891 Unspecified atrial fibrillation: Secondary | ICD-10-CM | POA: Diagnosis not present

## 2020-01-21 DIAGNOSIS — I700 Atherosclerosis of aorta: Secondary | ICD-10-CM | POA: Diagnosis not present

## 2020-01-21 DIAGNOSIS — I10 Essential (primary) hypertension: Secondary | ICD-10-CM | POA: Diagnosis not present

## 2020-01-21 DIAGNOSIS — I493 Ventricular premature depolarization: Secondary | ICD-10-CM | POA: Diagnosis not present

## 2020-01-21 DIAGNOSIS — E038 Other specified hypothyroidism: Secondary | ICD-10-CM | POA: Diagnosis not present

## 2020-01-23 ENCOUNTER — Ambulatory Visit: Payer: Medicare Other | Admitting: Cardiovascular Disease

## 2020-01-23 ENCOUNTER — Ambulatory Visit: Payer: Medicare Other | Attending: Internal Medicine

## 2020-01-23 DIAGNOSIS — Z23 Encounter for immunization: Secondary | ICD-10-CM | POA: Insufficient documentation

## 2020-01-23 NOTE — Progress Notes (Signed)
   Covid-19 Vaccination Clinic  Name:  [name]    MRN: [id number] DOB: 10-21-1946  01/23/2020  [name] was observed post Covid-19 immunization for 15 minutes without incidence. She was provided with Vaccine Information Sheet and instruction to access the V-Safe system.   [name] was instructed to call 911 with any severe reactions post vaccine: Marland Kitchen Difficulty breathing  . Swelling of your face and throat  . A fast heartbeat  . A bad rash all over your body  . Dizziness and weakness    Immunizations Administered    Name Date Dose VIS Date Route   Pfizer COVID-19 Vaccine 01/23/2020 11:09 AM 0.3 mL 11/29/2019 Intramuscular   Manufacturer: [place]   Lot: [id number]   [place]: [id number]

## 2020-01-29 ENCOUNTER — Telehealth: Payer: Self-pay

## 2020-01-29 ENCOUNTER — Ambulatory Visit (INDEPENDENT_AMBULATORY_CARE_PROVIDER_SITE_OTHER): Payer: Medicare Other | Admitting: Otolaryngology

## 2020-01-29 ENCOUNTER — Encounter (INDEPENDENT_AMBULATORY_CARE_PROVIDER_SITE_OTHER): Payer: Self-pay | Admitting: Otolaryngology

## 2020-01-29 ENCOUNTER — Other Ambulatory Visit: Payer: Self-pay

## 2020-01-29 VITALS — Temp 97.3°F

## 2020-01-29 DIAGNOSIS — R040 Epistaxis: Secondary | ICD-10-CM

## 2020-01-29 DIAGNOSIS — E785 Hyperlipidemia, unspecified: Secondary | ICD-10-CM

## 2020-01-29 MED ORDER — ATORVASTATIN CALCIUM 20 MG PO TABS: ORAL_TABLET | ORAL | 3 refills | Status: AC

## 2020-01-29 NOTE — Progress Notes (Signed)
HPI: [name] is a 74 y.o. female who presents for evaluation of right-sided epistaxis.  She had previous cauterization along the for the nose on the right side performed in 2014 and she did well up until just recently.  She has had some recurrent bleeding from the right nostril when she blows her nose.  Sometimes it just spontaneously drips down her upper lip.  Denies any pain. Patient is on Xarelto.  Past Medical History:  Diagnosis Date  . Anemia   . Arthritis    fingers   . Blood transfusion    1968  . Breast cancer ([place]) 2009   T1N0M0 DCIS right breast  . Breast cancer ([place]) 2013   a. reocurrence right breast;  b. s/p Taxotere, Cytoxan and XRT  . Carotid stenosis    a. dopplers 2/97:  RICA 9-89%, LICA 21-19% => repeat due in 01/2013;  b.  Carotid US (4/17): RICA 4-08%; LICA 14-48% - f/u 6 mos  . Chronic diastolic CHF (congestive heart failure) ([place]) 11/28/2011   Echo 09/18/2018:  EF 55-60, no RWMA, Gr 2 DD, mod MR, mild TR  . Fracture of left patella 12/2016  . History of radiation therapy 07/10/12-08/13/12   right breast/chest wall  . Hodgkin's disease    stage 2b;  s/p XRT 1988  . HTN (hypertension)   . Hypothyroidism    2/2 XRT for Hodgkins Lymphoma  . LBBB (left bundle branch block)   . Mucositis 04/29/2012  . NICM (nonischemic cardiomyopathy) ([place])    a. EF as low as 45%;  b. LHC 5/05:  LM 10%, pLAD 30%, EF 45-50%,   c. Echo 5/13:  EF 60-65%, Gr 1 diast dysfn, MAC;   d.  Echo 11/13:  EF 60-65%, Gr 1 diast dysfn, Tr AI, mild MR;  d.  Echo 05/10/13: EF 18-56%, grade 1 diastolic dysfunction, PASP 36, small effusion.  Marland Kitchen PAF (paroxysmal atrial fibrillation) ([place])    a. dx in 2008;  b. CHADS2-VASc=5;  c. changed to [place] during admx 05/2013; d. Xarelto started 05/2013; e. s/p successful Afib ablation 06/30/2017  . PEA (Pulseless electrical activity) ([place])    a. admx 05/2013 for APE in setting of HTN emergency c/b PEA arrest and asp pneumonia, VDRF, AFib with RVR  . Pneumonia  08/02/2018  . Raynaud's disease   . Status post radiation therapy 1988   mantle and periaortic    Past Surgical History:  Procedure Laterality Date  . ATRIAL FIBRILLATION ABLATION  06/30/2017  . ATRIAL FIBRILLATION ABLATION N/A 06/30/2017   Procedure: Atrial Fibrillation Ablation;  Surgeon: Constance Haw, MD;  Location: [facility];  Service: Cardiovascular;  Laterality: N/A;  . BREAST LUMPECTOMY  2009   right, lymph node biopsy  . BTL  1971  . DILATION AND CURETTAGE OF UTERUS    . IR RADIOLOGY PERIPHERAL GUIDED IV START  06/23/2017  . IR US GUIDE VASC ACCESS LEFT  06/23/2017  . MASS EXCISION  03/28/2012   Procedure: EXCISION MASS;  Surgeon: Edward Jolly, MD;  Location: [facility];  Service: General;  Laterality: Right;  Excision right chest wall mass  . MASTECTOMY  2009   right breast  . POLYPECTOMY  1990   D&C (also in 1977 and [place])   . PORT-A-CATH REMOVAL  10/03/2012   Procedure: REMOVAL PORT-A-CATH;  Surgeon: Edward Jolly, MD;  Location: [facility];  Service: General;  Laterality: N/A;  . PORTACATH PLACEMENT  04/16/2012   Procedure: INSERTION  PORT-A-CATH;  Surgeon: Edward Jolly, MD;  Location: [facility];  Service: General;  Laterality: Left;  Placement of Port-a-Cath, left subclavian  . R vein ligation & stripping  1976  . Spleenectomy  1988   for Hodgkin's disease  . Thoractomy  1968  . TISSUE EXPANDER  REMOVAL W/ REPLACEMENT OF IMPLANT    . TISSUE EXPANDER PLACEMENT     right breast  . TONSILLECTOMY AND ADENOIDECTOMY  1965   Social History   Socioeconomic History  . Marital status: Widowed    Spouse name: Donnie  . Number of children: Not on file  . Years of education: Not on file  . Highest education level: Not on file  Occupational History  . Occupation: Holiday representative: [employer]  Tobacco Use  . Smoking status: Never Smoker  . Smokeless tobacco: Never Used  Substance and Sexual Activity  . Alcohol use: No   . Drug use: No  . Sexual activity: Not Currently    Birth control/protection: None    Comment: post menopausal, HRT x 8 yrs  Other Topics Concern  . Not on file  Social History Narrative   Regular exercise. Retired and married.    Social Determinants of Health   Financial Resource Strain:   . Difficulty of Paying Living Expenses: Not on file  Food Insecurity:   . Worried About Charity fundraiser in the Last Year: Not on file  . Ran Out of Food in the Last Year: Not on file  Transportation Needs:   . Lack of Transportation (Medical): Not on file  . Lack of Transportation (Non-Medical): Not on file  Physical Activity:   . Days of Exercise per Week: Not on file  . Minutes of Exercise per Session: Not on file  Stress:   . Feeling of Stress : Not on file  Social Connections:   . Frequency of Communication with Friends and Family: Not on file  . Frequency of Social Gatherings with Friends and Family: Not on file  . Attends Religious Services: Not on file  . Active Member of Clubs or Organizations: Not on file  . Attends Archivist Meetings: Not on file  . Marital Status: Not on file   Family History  Problem Relation Age of Onset  . Hypertension Mother   . Cancer Neg Hx    Allergies  Allergen Reactions  . Ace Inhibitors Cough  . Benazepril Cough  . Epinephrine Other (See Comments)    Feels "buzzed" and heart rate increases  . Potassium-Containing Compounds Nausea And Vomiting    Projectile vomiting--liquid potassium   . Taxotere [Docetaxel] Other (See Comments)    Fluid overload   Prior to Admission medications   Medication Sig Start Date End Date Taking? Authorizing Provider  acetaminophen (TYLENOL) 500 MG tablet Take 500 mg by mouth every 8 (eight) hours as needed for mild pain, moderate pain or headache.   Yes [provider]  amLODipine (NORVASC) 5 MG tablet TAKE 1 TABLET BY MOUTH EVERY DAY 08/14/19  Yes Josue Hector, MD  atorvastatin  (LIPITOR) 10 MG tablet TAKE 1 TABLET BY MOUTH EVERY DAY AT 6PM 01/09/20  Yes Josue Hector, MD  b complex vitamins capsule Take 1 capsule by mouth daily after breakfast.   Yes [provider]  calcium carbonate (OS-CAL) 600 MG TABS Take 600 mg by mouth at bedtime.    Yes [provider]  carvedilol (COREG) 12.5 MG tablet TAKE  1 TABLET(12.5 MG) BY MOUTH TWICE DAILY WITH A MEAL. Please make yearly appt with Dr. Johnsie Cancel for January for future refills. 1st attempt 11/12/19  Yes Josue Hector, MD  Cholecalciferol (VITAMIN D) 2000 UNITS CAPS Take 2,000 Units by mouth daily with lunch.    Yes [provider]  furosemide (LASIX) 40 MG tablet Take 1 tablet (40 mg total) by mouth daily. 08/24/18  Yes Hongalgi, Lenis Dickinson, MD  hydrALAZINE (APRESOLINE) 25 MG tablet Take 1 tablet (25 mg total) by mouth 3 (three) times daily. 09/26/18  Yes Weaver, Scott T, PA-C  levothyroxine (SYNTHROID, LEVOTHROID) 100 MCG tablet Take 100 mcg by mouth daily at 6 (six) AM.    Yes [provider]  Multiple Vitamin (MULTIVITAMIN) tablet Take 1 tablet by mouth daily after breakfast. "Natural Vitamin"   Yes [provider]  OVER THE COUNTER MEDICATION BDZ tablets: Take 1 tablet by mouth at bedtime   Yes [provider]  OVER THE COUNTER MEDICATION Vegetarian Omega Green: Take 3 capsules by mouth daily with lunch   Yes [provider]  propafenone (RYTHMOL) 225 MG tablet TAKE 1 TABLET(225 MG) BY MOUTH TWICE DAILY 01/14/20  Yes Camnitz, Ocie Doyne, MD  UNABLE TO FIND PB assist 2 daily   Yes [provider]  UNABLE TO FIND terrazyme two with meals   Yes [provider]  XARELTO 20 MG TABS tablet TAKE 1 TABLET BY MOUTH DAILY WITH DINNER 01/14/20  Yes Camnitz, Ocie Doyne, MD     Positive ROS: Otherwise negative  All other systems have been reviewed and were otherwise negative with the exception of those mentioned in the HPI and as above.  Physical  Exam: Constitutional: Alert, well-appearing, no acute distress Ears: External ears without lesions or tenderness. Ear canals are clear bilaterally with intact, clear TMs.  Nasal: External nose without lesions. Septum with minimal deformity..  She has what appears to be almost granulation tissue measuring approximately 2 to 3 mm size along the floor of the nose on the right side adjacent to the septum.  This was cauterized in the office using silver nitrate.  Remaining nasal cavity was clear. Oral: Lips and gums without lesions. Tongue and palate mucosa without lesions. Posterior oropharynx clear. Neck: No palpable adenopathy or masses Respiratory: Breathing comfortably  Skin: No facial/neck lesions or rash noted.  Control of epistaxis  Date/Time: 01/29/2020 1:02 PM Performed by: Rozetta Nunnery, MD Authorized by: Rozetta Nunnery, MD   Consent:    Consent obtained:  Verbal   Consent given by:  Patient   Risks discussed:  Pain Anesthesia:    Anesthesia method:  None Procedure details:    Treatment site:  R anterior   Treatment method:  Silver nitrate   Treatment complexity:  Limited   Treatment episode: initial   Post-procedure details:    Assessment:  Bleeding stopped   Patient tolerance of procedure:  Tolerated well, no immediate complications Comments:     Patient had some abnormal granulation appearing tissue along the floor of the right nostril that was cauterized with silver nitrate.    Assessment: Right-sided epistaxis  Plan: This was cauterized using silver nitrate. She will follow-up as needed.  Radene Journey, MD

## 2020-01-29 NOTE — Telephone Encounter (Signed)
-----   Message from Josue Hector, MD sent at 01/28/2020  3:35 PM EST ----- LDL 114 if she has been taking lipitor 10 mg daily increase to 20 mg and f/u labs in 3 months target LDL 70 or less

## 2020-01-29 NOTE — Telephone Encounter (Signed)
Called patient back with results. Per Dr. Johnsie Cancel, LDL 114 if she has been taking lipitor 10 mg daily increase to 20 mg and f/u labs in 3 months target LDL 70 or less. Patient verbalized understanding and will come in on 04/27/20 for lab work. Will send Lipitor 20 mg to patient's pharmacy.

## 2020-02-03 ENCOUNTER — Ambulatory Visit: Payer: Medicare Other

## 2020-02-10 ENCOUNTER — Other Ambulatory Visit: Payer: Self-pay | Admitting: Cardiovascular Disease

## 2020-02-17 ENCOUNTER — Other Ambulatory Visit: Payer: Self-pay

## 2020-02-17 ENCOUNTER — Ambulatory Visit: Payer: Medicare Other | Attending: Internal Medicine

## 2020-02-17 DIAGNOSIS — Z23 Encounter for immunization: Secondary | ICD-10-CM | POA: Insufficient documentation

## 2020-02-17 NOTE — Progress Notes (Signed)
   Covid-19 Vaccination Clinic  Name:  [name]    MRN: [id number] DOB: 20-May-1946  02/17/2020  [name] was observed post Covid-19 immunization for 15 minutes without incidence. She was provided with Vaccine Information Sheet and instruction to access the V-Safe system.   [name] was instructed to call 911 with any severe reactions post vaccine: Marland Kitchen Difficulty breathing  . Swelling of your face and throat  . A fast heartbeat  . A bad rash all over your body  . Dizziness and weakness    Immunizations Administered    Name Date Dose VIS Date Route   Pfizer COVID-19 Vaccine 02/17/2020 10:11 AM 0.3 mL 11/29/2019 Intramuscular   Manufacturer: [place]   Lot: [id number]   [place]: [id number]

## 2020-03-03 ENCOUNTER — Other Ambulatory Visit: Payer: Self-pay | Admitting: *Deleted

## 2020-03-03 ENCOUNTER — Ambulatory Visit (INDEPENDENT_AMBULATORY_CARE_PROVIDER_SITE_OTHER): Payer: Medicare Other | Admitting: Cardiology

## 2020-03-03 ENCOUNTER — Other Ambulatory Visit: Payer: Self-pay

## 2020-03-03 ENCOUNTER — Encounter: Payer: Self-pay | Admitting: Cardiology

## 2020-03-03 VITALS — BP 152/68 | HR 86 | Ht 61.0 in | Wt 138.6 lb

## 2020-03-03 DIAGNOSIS — I493 Ventricular premature depolarization: Secondary | ICD-10-CM

## 2020-03-03 MED ORDER — RIVAROXABAN 20 MG PO TABS: ORAL_TABLET | ORAL | 2 refills | Status: AC

## 2020-03-03 NOTE — Telephone Encounter (Signed)
Xarelto 20mg  refill request received. Pt is 74 years old, weight-62.9kg, Crea-0.87 on 01/21/2020 via scanned labs from [facility] PCP, last seen by Dr. Curt Bears on 03/03/2020, Diagnosis-Afib, CrCl-56.66ml/min; Dose is appropriate based on dosing criteria. Will send in refill to requested pharmacy.

## 2020-03-03 NOTE — Progress Notes (Signed)
Electrophysiology Office Note   Date:  03/03/2020   ID:  Lucresha, Dismuke [birth year], MRN [id number]  PCP:  [name], MD  Cardiologist:  Johnsie Cancel Primary Electrophysiologist:  Angelo Prindle Meredith Leeds, MD    No chief complaint on file.    History of Present Illness: YUVONNE LANAHAN is a 74 y.o. female who is being seen today for the evaluation of atrial fibrillation at the request of [name], MD. Presenting today for electrophysiology evaluation. Has a history of nonischemic cardio myopathy with an EF that has since improved, diastolic heart failure, hypertension, left bundle branch block, hypothyroidism, Hodgkin's disease, breast cancer, and carotid stenosis.  Radiation therapy for Hodgkin's lymphoma in 1998, mastectomy in 2009 and recurrent breast cancer in 2013. EF 45% in the past which is since improved. Flecainide was started in 2013 for recurrent atrial fibrillation. Treadmill test was negative for arrhythmia. Admitted for pulmonary edema on 5/14 in the setting of hypertensive crisis complicated by PEA arrest. Had AF ablation 06/30/17.  Today, denies symptoms of palpitations, chest pain, shortness of breath, orthopnea, PND, lower extremity edema, claudication, dizziness, presyncope, syncope, bleeding, or neurologic sequela. The patient is tolerating medications without difficulties.  Overall she is doing well.  She is unaware of palpitations.  She does not feel that she has been in atrial fibrillation and has not had any PVCs.  She has been more active now that she has had both doses of the vaccine.  Past Medical History:  Diagnosis Date  . Anemia   . Arthritis    fingers   . Blood transfusion    1968  . Breast cancer ([place]) 2009   T1N0M0 DCIS right breast  . Breast cancer ([place]) 2013   a. reocurrence right breast;  b. s/p Taxotere, Cytoxan and XRT  . Carotid stenosis    a. dopplers 3/08:  RICA 6-57%, LICA 84-69% => repeat due in 01/2013;  b.  Carotid US (6/29): RICA  5-28%; LICA 41-32% - f/u 6 mos  . Chronic diastolic CHF (congestive heart failure) ([place]) 11/28/2011   Echo 09/18/2018:  EF 55-60, no RWMA, Gr 2 DD, mod MR, mild TR  . Fracture of left patella 12/2016  . History of radiation therapy 07/10/12-08/13/12   right breast/chest wall  . Hodgkin's disease    stage 2b;  s/p XRT 1988  . HTN (hypertension)   . Hypothyroidism    2/2 XRT for Hodgkins Lymphoma  . LBBB (left bundle branch block)   . Mucositis 04/29/2012  . NICM (nonischemic cardiomyopathy) ([place])    a. EF as low as 45%;  b. LHC 5/05:  LM 10%, pLAD 30%, EF 45-50%,   c. Echo 5/13:  EF 60-65%, Gr 1 diast dysfn, MAC;   d.  Echo 11/13:  EF 60-65%, Gr 1 diast dysfn, Tr AI, mild MR;  d.  Echo 05/10/13: EF 44-01%, grade 1 diastolic dysfunction, PASP 36, small effusion.  Marland Kitchen PAF (paroxysmal atrial fibrillation) ([place])    a. dx in 2008;  b. CHADS2-VASc=5;  c. changed to [place] during admx 05/2013; d. Xarelto started 05/2013; e. s/p successful Afib ablation 06/30/2017  . PEA (Pulseless electrical activity) ([place])    a. admx 05/2013 for APE in setting of HTN emergency c/b PEA arrest and asp pneumonia, VDRF, AFib with RVR  . Pneumonia 08/02/2018  . Raynaud's disease   . Status post radiation therapy 1988   mantle and periaortic    Past Surgical History:  Procedure Laterality Date  . ATRIAL  FIBRILLATION ABLATION  06/30/2017  . ATRIAL FIBRILLATION ABLATION N/A 06/30/2017   Procedure: Atrial Fibrillation Ablation;  Surgeon: Constance Haw, MD;  Location: [facility];  Service: Cardiovascular;  Laterality: N/A;  . BREAST LUMPECTOMY  2009   right, lymph node biopsy  . BTL  1971  . DILATION AND CURETTAGE OF UTERUS    . IR RADIOLOGY PERIPHERAL GUIDED IV START  06/23/2017  . IR US GUIDE VASC ACCESS LEFT  06/23/2017  . MASS EXCISION  03/28/2012   Procedure: EXCISION MASS;  Surgeon: Edward Jolly, MD;  Location: [facility];  Service: General;  Laterality: Right;  Excision right chest wall  mass  . MASTECTOMY  2009   right breast  . POLYPECTOMY  1990   D&C (also in 1977 and [place])   . PORT-A-CATH REMOVAL  10/03/2012   Procedure: REMOVAL PORT-A-CATH;  Surgeon: Edward Jolly, MD;  Location: [facility];  Service: General;  Laterality: N/A;  . PORTACATH PLACEMENT  04/16/2012   Procedure: INSERTION PORT-A-CATH;  Surgeon: Edward Jolly, MD;  Location: [facility];  Service: General;  Laterality: Left;  Placement of Port-a-Cath, left subclavian  . R vein ligation & stripping  1976  . Spleenectomy  1988   for Hodgkin's disease  . Thoractomy  1968  . TISSUE EXPANDER  REMOVAL W/ REPLACEMENT OF IMPLANT    . TISSUE EXPANDER PLACEMENT     right breast  . TONSILLECTOMY AND ADENOIDECTOMY  1965     Current Outpatient Medications  Medication Sig Dispense Refill  . acetaminophen (TYLENOL) 500 MG tablet Take 500 mg by mouth every 8 (eight) hours as needed for mild pain, moderate pain or headache.    Marland Kitchen amLODipine (NORVASC) 5 MG tablet TAKE 1 TABLET BY MOUTH EVERY DAY 90 tablet 3  . atorvastatin (LIPITOR) 20 MG tablet TAKE 1 TABLET BY MOUTH EVERY DAY AT 6PM 90 tablet 3  . b complex vitamins capsule Take 1 capsule by mouth daily after breakfast.    . calcium carbonate (OS-CAL) 600 MG TABS Take 600 mg by mouth at bedtime.     . carvedilol (COREG) 12.5 MG tablet Take 1 tablet (12.5 mg total) by mouth 2 (two) times daily with a meal. TAKE 1 TABLET(12.5 MG TOTAL) BY MOUTH TWICE DAILY WITH A MEAL. PLEASE MAKE ANNUAL MD APPOINTMENT 180 tablet 3  . Cholecalciferol (VITAMIN D) 2000 UNITS CAPS Take 2,000 Units by mouth daily with lunch.     . docusate sodium (COLACE) 100 MG capsule Take 100 mg by mouth 3 (three) times daily.    . furosemide (LASIX) 40 MG tablet Take 1 tablet (40 mg total) by mouth daily. 30 tablet 0  . hydrALAZINE (APRESOLINE) 25 MG tablet Take 1 tablet (25 mg total) by mouth 3 (three) times daily. 90 tablet 10  . levothyroxine (SYNTHROID) 112 MCG tablet Take 112 mcg by mouth every  morning.    . Multiple Vitamin (MULTIVITAMIN) tablet Take 1 tablet by mouth daily after breakfast. "Natural Vitamin"    . OVER THE COUNTER MEDICATION BDZ tablets: Take 1 tablet by mouth at bedtime    . OVER THE COUNTER MEDICATION Vegetarian Omega Green: Take 3 capsules by mouth daily with lunch    . propafenone (RYTHMOL) 225 MG tablet TAKE 1 TABLET(225 MG) BY MOUTH TWICE DAILY 180 tablet 2  . UNABLE TO FIND PB assist 2 daily    . UNABLE TO FIND terrazyme two with meals    . XARELTO 20 MG  TABS tablet TAKE 1 TABLET BY MOUTH DAILY WITH DINNER 90 tablet 0   No current facility-administered medications for this visit.    Allergies:   Ace inhibitors, Benazepril, Epinephrine, Potassium-containing compounds, and Taxotere [docetaxel]   Social History:  The patient  reports that she has never smoked. She has never used smokeless tobacco. She reports that she does not drink alcohol or use drugs.   Family History:  The patient's family history includes Hypertension in her mother.   ROS:  Please see the history of present illness.   Otherwise, review of systems is positive for none.   All other systems are reviewed and negative.   PHYSICAL EXAM: VS:  BP (!) 152/68   Pulse 86   Ht _0  (1.549 m)   Wt 138 lb 9.6 oz (62.9 kg)   LMP 03/08/1995   SpO2 96%   BMI 26.19 kg/m  , BMI Body mass index is 26.19 kg/m. GEN: Well nourished, well developed, in no acute distress  HEENT: normal  Neck: no JVD, carotid bruits, or masses Cardiac: RRR; no murmurs, rubs, or gallops,no edema  Respiratory:  clear to auscultation bilaterally, normal work of breathing GI: soft, nontender, nondistended, + BS MS: no deformity or atrophy  Skin: warm and dry Neuro:  Strength and sensation are intact Psych: euthymic mood, full affect  EKG:  EKG is ordered today. Personal review of the ekg ordered shows sinus rhythm, rate 86, right bundle branch block  Recent Labs: No results found for requested labs within last 8760  hours.    Lipid Panel     Component Value Date/Time   CHOL 163 05/18/2013 0440   TRIG 175 (H) 05/18/2013 0440   HDL 26 (L) 05/18/2013 0440   CHOLHDL 6.3 05/18/2013 0440   VLDL 35 05/18/2013 0440   LDLCALC 102 (H) 05/18/2013 0440     Wt Readings from Last 3 Encounters:  03/03/20 138 lb 9.6 oz (62.9 kg)  01/07/20 139 lb (63 kg)  10/14/19 137 lb 11.2 oz (62.5 kg)      Other studies Reviewed: Additional studies/ records that were reviewed today include: TTE 09/18/18 Review of the above records today demonstrates:  - Left ventricle: The cavity size was normal. Systolic function was   normal. The estimated ejection fraction was in the range of 55%   to 60%. Wall motion was normal; there were no regional wall   motion abnormalities. Features are consistent with a pseudonormal   left ventricular filling pattern, with concomitant abnormal   relaxation and increased filling pressure (grade 2 diastolic   dysfunction). - Mitral valve: Mildly thickened leaflets . There was moderate   regurgitation. - Tricuspid valve: There was mild regurgitation.  Holter monitor 02/05/2018-personally reviewed Minimum HR: 69 BPM at 5:34:07 AM Maximum HR: 126 BPM at 1:15:44 PM Average HR: 84 BPM Ventricular Beats: 41760 (35%) Supraventricular Beats: 42 (<1%) Sinus rhythm with sinus tachycardia 17 beats irregular narrow complex tachycardia Diary event of resting with thumps associated with ventricular bigeminy  ASSESSMENT AND PLAN:  1.  Paroxysmal atrial fibrillation: Currently on Coreg, propafenone, Xarelto.  CHA2DS2-VASc of 3.  Fortunately she remains in sinus rhythm.  Her QRS is mildly wider than it was prior to initiation, but this is less than 20%.  We'll continue with current monitoring.   2. Chronic diastolic heart failure: No obvious volume overload  3. Hypertension: Blood pressure is mildly elevated but she did not take her Lasix today.  Is more controlled at home.  No changes.  4.   PVCs: 35% on cardiac monitor.  Currently on propafenone.  Fortunately her arrhythmia burden has come down with propafenone.  No changes.  Current medicines are reviewed at length with the patient today.   The patient does not have concerns regarding her medicines.  The following changes were made today: None  Labs/ tests ordered today include:  Orders Placed This Encounter  Procedures  . EKG 12-Lead     Disposition:   FU with Gwynne Kemnitz 6 months  Signed, Qianna Clagett Meredith Leeds, MD  03/03/2020 3:56 PM     [facility] [address] [place] [address] [phone] (office) [phone] (fax)

## 2020-03-03 NOTE — Patient Instructions (Addendum)
Medication Instructions:  Your physician recommends that you continue on your current medications as directed. Please refer to the Current Medication list given to you today.  *If you need a refill on your cardiac medications before your next appointment, please call your pharmacy*   Lab Work: None ordered If you have labs (blood work) drawn today and your tests are completely normal, you will receive your results only by: . MyChart Message (if you have MyChart) OR . A paper copy in the mail If you have any lab test that is abnormal or we need to change your treatment, we will call you to review the results.   Testing/Procedures: None ordered   Follow-Up: At [facility], you and your health needs are our priority.  As part of our continuing mission to provide you with exceptional heart care, we have created designated Provider Care Teams.  These Care Teams include your primary Cardiologist (physician) and Advanced Practice Providers (APPs -  Physician Assistants and Nurse Practitioners) who all work together to provide you with the care you need, when you need it.  We recommend signing up for the patient portal called "MyChart".  Sign up information is provided on this After Visit Summary.  MyChart is used to connect with patients for Virtual Visits (Telemedicine).  Patients are able to view lab/test results, encounter notes, upcoming appointments, etc.  Non-urgent messages can be sent to your provider as well.   To learn more about what you can do with MyChart, go to [email or web address].    Your next appointment:   6 month(s)  The format for your next appointment:   In Person  Provider:   Will Camnitz, MD   Thank you for choosing [facility]!!   Sheylin Scharnhorst, RN [phone]    Other Instructions    

## 2020-04-10 ENCOUNTER — Ambulatory Visit
Admission: RE | Admit: 2020-04-10 | Discharge: 2020-04-10 | Disposition: A | Discharge status: Home / Self Care | Payer: Medicare Other | Source: Ambulatory Visit | Attending: Family Medicine | Admitting: Family Medicine

## 2020-04-10 ENCOUNTER — Other Ambulatory Visit: Payer: Self-pay

## 2020-04-10 DIAGNOSIS — Z780 Asymptomatic menopausal state: Secondary | ICD-10-CM | POA: Diagnosis not present

## 2020-04-10 DIAGNOSIS — M8588 Other specified disorders of bone density and structure, other site: Secondary | ICD-10-CM | POA: Diagnosis not present

## 2020-04-10 DIAGNOSIS — M810 Age-related osteoporosis without current pathological fracture: Secondary | ICD-10-CM | POA: Diagnosis not present

## 2020-04-10 DIAGNOSIS — M8580 Other specified disorders of bone density and structure, unspecified site: Secondary | ICD-10-CM

## 2020-04-22 DIAGNOSIS — H02834 Dermatochalasis of left upper eyelid: Secondary | ICD-10-CM | POA: Diagnosis not present

## 2020-04-22 DIAGNOSIS — H40013 Open angle with borderline findings, low risk, bilateral: Secondary | ICD-10-CM | POA: Diagnosis not present

## 2020-04-22 DIAGNOSIS — H02831 Dermatochalasis of right upper eyelid: Secondary | ICD-10-CM | POA: Diagnosis not present

## 2020-04-22 DIAGNOSIS — Z961 Presence of intraocular lens: Secondary | ICD-10-CM | POA: Diagnosis not present

## 2020-04-22 DIAGNOSIS — D492 Neoplasm of unspecified behavior of bone, soft tissue, and skin: Secondary | ICD-10-CM | POA: Diagnosis not present

## 2020-04-27 ENCOUNTER — Other Ambulatory Visit: Payer: Self-pay

## 2020-04-27 ENCOUNTER — Other Ambulatory Visit: Payer: Medicare Other | Admitting: *Deleted

## 2020-04-27 DIAGNOSIS — E785 Hyperlipidemia, unspecified: Secondary | ICD-10-CM | POA: Diagnosis not present

## 2020-04-27 LAB — LIPID PANEL
Chol/HDL Ratio: 2 ratio (ref 0.0–4.4)
Cholesterol, Total: 171 mg/dL (ref 100–199)
HDL: 84 mg/dL (ref >39)
LDL Chol Calc (NIH): 78 mg/dL (ref 0–99)
Triglycerides: 43 mg/dL (ref 0–149)
VLDL Cholesterol Cal: 9 mg/dL (ref 5–40)

## 2020-04-27 LAB — HEPATIC FUNCTION PANEL
ALT: 14 IU/L (ref 0–32)
AST: 18 IU/L (ref 0–40)
Albumin: 4.3 g/dL (ref 3.7–4.7)
Alkaline Phosphatase: 150 IU/L — ABNORMAL HIGH (ref 39–117)
Bilirubin Total: 0.3 mg/dL (ref 0.0–1.2)
Bilirubin, Direct: 0.13 mg/dL (ref 0.00–0.40)
Total Protein: 6.1 g/dL (ref 6.0–8.5)

## 2020-04-28 ENCOUNTER — Telehealth: Payer: Self-pay | Admitting: Cardiovascular Disease

## 2020-04-28 NOTE — Telephone Encounter (Signed)
Patient aware of results.

## 2020-04-28 NOTE — Telephone Encounter (Signed)
New message  Patient is returning call about results. Please give patient a call back to discuss.

## 2020-05-04 DIAGNOSIS — D487 Neoplasm of uncertain behavior of other specified sites: Secondary | ICD-10-CM | POA: Diagnosis not present

## 2020-05-04 DIAGNOSIS — L723 Sebaceous cyst: Secondary | ICD-10-CM | POA: Diagnosis not present

## 2020-05-11 DIAGNOSIS — D2261 Melanocytic nevi of right upper limb, including shoulder: Secondary | ICD-10-CM | POA: Diagnosis not present

## 2020-05-11 DIAGNOSIS — Z85828 Personal history of other malignant neoplasm of skin: Secondary | ICD-10-CM | POA: Diagnosis not present

## 2020-05-11 DIAGNOSIS — D2262 Melanocytic nevi of left upper limb, including shoulder: Secondary | ICD-10-CM | POA: Diagnosis not present

## 2020-05-11 DIAGNOSIS — D2272 Melanocytic nevi of left lower limb, including hip: Secondary | ICD-10-CM | POA: Diagnosis not present

## 2020-05-11 DIAGNOSIS — D2271 Melanocytic nevi of right lower limb, including hip: Secondary | ICD-10-CM | POA: Diagnosis not present

## 2020-05-11 DIAGNOSIS — L821 Other seborrheic keratosis: Secondary | ICD-10-CM | POA: Diagnosis not present

## 2020-05-11 DIAGNOSIS — D225 Melanocytic nevi of trunk: Secondary | ICD-10-CM | POA: Diagnosis not present

## 2020-05-28 DIAGNOSIS — M25551 Pain in right hip: Secondary | ICD-10-CM | POA: Diagnosis not present

## 2020-06-03 ENCOUNTER — Ambulatory Visit (HOSPITAL_COMMUNITY)
Admission: RE | Admit: 2020-06-03 | Payer: Medicare Other | Source: Ambulatory Visit | Attending: Cardiovascular Disease | Admitting: Cardiovascular Disease

## 2020-06-04 ENCOUNTER — Telehealth: Payer: Self-pay | Admitting: Cardiovascular Disease

## 2020-06-04 NOTE — Telephone Encounter (Signed)
Patient called and tried to get an appt today or tomorrow. Told patient that we did not have any available slots those days and and now she would like to speak with a nurse in triage. Please call to discuss

## 2020-06-04 NOTE — Telephone Encounter (Signed)
Returned call to pt who states she is having difficulty taking a deep breath. Is a retired [employer] scientist and states this is how she felt prior to previous hospitalization in 2019. Per record review that hospitalization was for acute respiratory failure. She takes Lasix 40 mg daily and states she feels improvement after taking this medication.  BP 135/65, pulse 80-84 but feels irregular I asked if her irregular HR feels like her PVCs and she states she is unable to tell. Takes propafenone for PVC burden which last time it was checked was 0%. She called to get an appointment and was told soonest appointment is July. She has an appointment with PA at Dr. Andrew Au office later today. I advised her to keep that appointment and to call back if cardiology f/u is needed. She verbalized understanding and agreement with plan and thanked me for the call.

## 2020-06-05 NOTE — Telephone Encounter (Signed)
[name] need 3 day monitor to look for recurrent PVCs.

## 2020-06-08 NOTE — Telephone Encounter (Signed)
Dr. Curt Bears agreeable to continued monitoring for now.  Pt to call if reoccurs.

## 2020-06-08 NOTE — Telephone Encounter (Signed)
Pt reports doing better since last weeks call.  States her breathing was better on Friday morning. She did go to her PCP Friday who ordered blood work and extra 1/2 of Lasix x 3 days.  She is awaiting blood work results. She states she thinks that eating out has contributed to the problem.  States she has noticed that every time she eats out she has issues afterward.  She had 1/2 sub one day and the other 1/2 the next day -- agrees it may have been too much salt.  Considering that she feels more issues r/t to some unhealthy foods/eating out, she would like to continue monitoring for now.  If symptoms reoccur we could re-address monitor at that time if Dr. Curt Bears agreeable.  For now she would like to continue monitoring. Aware I will call or send mychart message with his response. Pt agreeable to plan.

## 2020-06-15 ENCOUNTER — Other Ambulatory Visit (HOSPITAL_COMMUNITY): Payer: Self-pay | Admitting: Cardiovascular Disease

## 2020-06-15 ENCOUNTER — Ambulatory Visit (HOSPITAL_COMMUNITY)
Admission: RE | Admit: 2020-06-15 | Discharge: 2020-06-15 | Disposition: A | Discharge status: Home / Self Care | Payer: Medicare Other | Source: Ambulatory Visit | Attending: Cardiology | Admitting: Cardiology

## 2020-06-15 ENCOUNTER — Other Ambulatory Visit: Payer: Self-pay

## 2020-06-15 DIAGNOSIS — I6523 Occlusion and stenosis of bilateral carotid arteries: Secondary | ICD-10-CM | POA: Insufficient documentation

## 2020-06-15 DIAGNOSIS — I6522 Occlusion and stenosis of left carotid artery: Secondary | ICD-10-CM

## 2020-06-23 ENCOUNTER — Telehealth: Payer: Self-pay

## 2020-06-23 DIAGNOSIS — I6522 Occlusion and stenosis of left carotid artery: Secondary | ICD-10-CM

## 2020-06-23 NOTE — Telephone Encounter (Signed)
-----   Message from Josue Hector, MD sent at 06/20/2020 11:19 AM EDT ----- 60-79% left ICA fu/ duplex 6 months

## 2020-06-26 NOTE — Telephone Encounter (Signed)
Patient is aware of results. Order placed for carotid's in 6 months.

## 2020-07-17 DIAGNOSIS — E039 Hypothyroidism, unspecified: Secondary | ICD-10-CM | POA: Diagnosis not present

## 2020-07-17 DIAGNOSIS — R208 Other disturbances of skin sensation: Secondary | ICD-10-CM | POA: Diagnosis not present

## 2020-07-17 DIAGNOSIS — R5383 Other fatigue: Secondary | ICD-10-CM | POA: Diagnosis not present

## 2020-07-30 ENCOUNTER — Ambulatory Visit
Admission: RE | Admit: 2020-07-30 | Discharge: 2020-07-30 | Disposition: A | Discharge status: Home / Self Care | Payer: Medicare Other | Source: Ambulatory Visit | Attending: Family Medicine | Admitting: Family Medicine

## 2020-07-30 ENCOUNTER — Other Ambulatory Visit: Payer: Self-pay | Admitting: Family Medicine

## 2020-07-30 ENCOUNTER — Other Ambulatory Visit: Payer: Self-pay

## 2020-07-30 DIAGNOSIS — R0789 Other chest pain: Secondary | ICD-10-CM

## 2020-07-30 DIAGNOSIS — R918 Other nonspecific abnormal finding of lung field: Secondary | ICD-10-CM | POA: Diagnosis not present

## 2020-07-30 DIAGNOSIS — Z853 Personal history of malignant neoplasm of breast: Secondary | ICD-10-CM | POA: Diagnosis not present

## 2020-07-31 ENCOUNTER — Other Ambulatory Visit: Payer: Self-pay | Admitting: Family Medicine

## 2020-07-31 DIAGNOSIS — R9389 Abnormal findings on diagnostic imaging of other specified body structures: Secondary | ICD-10-CM

## 2020-08-09 ENCOUNTER — Emergency Department (HOSPITAL_COMMUNITY): Payer: Medicare Other

## 2020-08-09 ENCOUNTER — Encounter (HOSPITAL_COMMUNITY): Payer: Self-pay

## 2020-08-09 ENCOUNTER — Inpatient Hospital Stay (HOSPITAL_COMMUNITY)
Admission: EM | Admit: 2020-08-09 | Discharge: 2020-08-11 | DRG: 206 | Disposition: A | Discharge status: Home / Self Care | Payer: Medicare Other | Attending: Internal Medicine | Admitting: Internal Medicine

## 2020-08-09 ENCOUNTER — Other Ambulatory Visit: Payer: Self-pay

## 2020-08-09 DIAGNOSIS — R112 Nausea with vomiting, unspecified: Secondary | ICD-10-CM | POA: Diagnosis not present

## 2020-08-09 DIAGNOSIS — N179 Acute kidney failure, unspecified: Secondary | ICD-10-CM | POA: Diagnosis present

## 2020-08-09 DIAGNOSIS — I447 Left bundle-branch block, unspecified: Secondary | ICD-10-CM | POA: Diagnosis present

## 2020-08-09 DIAGNOSIS — R110 Nausea: Secondary | ICD-10-CM | POA: Diagnosis not present

## 2020-08-09 DIAGNOSIS — R197 Diarrhea, unspecified: Secondary | ICD-10-CM | POA: Diagnosis not present

## 2020-08-09 DIAGNOSIS — R42 Dizziness and giddiness: Secondary | ICD-10-CM | POA: Diagnosis not present

## 2020-08-09 DIAGNOSIS — X58XXXA Exposure to other specified factors, initial encounter: Secondary | ICD-10-CM | POA: Diagnosis present

## 2020-08-09 DIAGNOSIS — C50911 Malignant neoplasm of unspecified site of right female breast: Secondary | ICD-10-CM | POA: Diagnosis not present

## 2020-08-09 DIAGNOSIS — R531 Weakness: Secondary | ICD-10-CM | POA: Diagnosis not present

## 2020-08-09 DIAGNOSIS — Z923 Personal history of irradiation: Secondary | ICD-10-CM | POA: Diagnosis not present

## 2020-08-09 DIAGNOSIS — E038 Other specified hypothyroidism: Secondary | ICD-10-CM

## 2020-08-09 DIAGNOSIS — Z7901 Long term (current) use of anticoagulants: Secondary | ICD-10-CM

## 2020-08-09 DIAGNOSIS — C50919 Malignant neoplasm of unspecified site of unspecified female breast: Secondary | ICD-10-CM | POA: Diagnosis present

## 2020-08-09 DIAGNOSIS — I10 Essential (primary) hypertension: Secondary | ICD-10-CM | POA: Diagnosis not present

## 2020-08-09 DIAGNOSIS — I7300 Raynaud's syndrome without gangrene: Secondary | ICD-10-CM | POA: Diagnosis present

## 2020-08-09 DIAGNOSIS — Z9011 Acquired absence of right breast and nipple: Secondary | ICD-10-CM

## 2020-08-09 DIAGNOSIS — I5032 Chronic diastolic (congestive) heart failure: Secondary | ICD-10-CM | POA: Diagnosis not present

## 2020-08-09 DIAGNOSIS — Z8249 Family history of ischemic heart disease and other diseases of the circulatory system: Secondary | ICD-10-CM

## 2020-08-09 DIAGNOSIS — E039 Hypothyroidism, unspecified: Secondary | ICD-10-CM | POA: Diagnosis present

## 2020-08-09 DIAGNOSIS — Z8572 Personal history of non-Hodgkin lymphomas: Secondary | ICD-10-CM

## 2020-08-09 DIAGNOSIS — Z79899 Other long term (current) drug therapy: Secondary | ICD-10-CM

## 2020-08-09 DIAGNOSIS — E860 Dehydration: Secondary | ICD-10-CM | POA: Diagnosis present

## 2020-08-09 DIAGNOSIS — T17990A Other foreign object in respiratory tract, part unspecified in causing asphyxiation, initial encounter: Secondary | ICD-10-CM | POA: Diagnosis not present

## 2020-08-09 DIAGNOSIS — Z20822 Contact with and (suspected) exposure to covid-19: Secondary | ICD-10-CM | POA: Diagnosis not present

## 2020-08-09 DIAGNOSIS — Z79890 Hormone replacement therapy (postmenopausal): Secondary | ICD-10-CM

## 2020-08-09 DIAGNOSIS — Z8571 Personal history of Hodgkin lymphoma: Secondary | ICD-10-CM | POA: Diagnosis not present

## 2020-08-09 DIAGNOSIS — R1111 Vomiting without nausea: Secondary | ICD-10-CM | POA: Diagnosis not present

## 2020-08-09 DIAGNOSIS — Z853 Personal history of malignant neoplasm of breast: Secondary | ICD-10-CM

## 2020-08-09 DIAGNOSIS — I110 Hypertensive heart disease with heart failure: Secondary | ICD-10-CM | POA: Diagnosis present

## 2020-08-09 DIAGNOSIS — Z888 Allergy status to other drugs, medicaments and biological substances status: Secondary | ICD-10-CM

## 2020-08-09 DIAGNOSIS — J90 Pleural effusion, not elsewhere classified: Secondary | ICD-10-CM | POA: Diagnosis not present

## 2020-08-09 LAB — BASIC METABOLIC PANEL
Anion gap: 14 (ref 5–15)
BUN: 34 mg/dL — ABNORMAL HIGH (ref 8–23)
CO2: 25 mmol/L (ref 22–32)
Calcium: 9.2 mg/dL (ref 8.9–10.3)
Chloride: 98 mmol/L (ref 98–111)
Creatinine, Ser: 1.61 mg/dL — ABNORMAL HIGH (ref 0.44–1.00)
GFR calc Af Amer: 36 mL/min — ABNORMAL LOW (ref >60)
GFR calc non Af Amer: 31 mL/min — ABNORMAL LOW (ref >60)
Glucose, Bld: 96 mg/dL (ref 70–99)
Potassium: 5.9 mmol/L — ABNORMAL HIGH (ref 3.5–5.1)
Sodium: 137 mmol/L (ref 135–145)

## 2020-08-09 LAB — COMPREHENSIVE METABOLIC PANEL
ALT: 82 U/L — ABNORMAL HIGH (ref 0–44)
AST: 74 U/L — ABNORMAL HIGH (ref 15–41)
Albumin: 3.8 g/dL (ref 3.5–5.0)
Alkaline Phosphatase: 166 U/L — ABNORMAL HIGH (ref 38–126)
Anion gap: 13 (ref 5–15)
BUN: 33 mg/dL — ABNORMAL HIGH (ref 8–23)
CO2: 25 mmol/L (ref 22–32)
Calcium: 9.9 mg/dL (ref 8.9–10.3)
Chloride: 98 mmol/L (ref 98–111)
Creatinine, Ser: 1.75 mg/dL — ABNORMAL HIGH (ref 0.44–1.00)
GFR calc Af Amer: 33 mL/min — ABNORMAL LOW (ref >60)
GFR calc non Af Amer: 28 mL/min — ABNORMAL LOW (ref >60)
Glucose, Bld: 123 mg/dL — ABNORMAL HIGH (ref 70–99)
Potassium: 4.6 mmol/L (ref 3.5–5.1)
Sodium: 136 mmol/L (ref 135–145)
Total Bilirubin: 1.1 mg/dL (ref 0.3–1.2)
Total Protein: 6.9 g/dL (ref 6.5–8.1)

## 2020-08-09 LAB — URINALYSIS, ROUTINE W REFLEX MICROSCOPIC
Bacteria, UA: NONE SEEN
Bilirubin Urine: NEGATIVE
Glucose, UA: 50 mg/dL — AB
Hgb urine dipstick: NEGATIVE
Ketones, ur: 5 mg/dL — AB
Leukocytes,Ua: NEGATIVE
Nitrite: NEGATIVE
Protein, ur: 100 mg/dL — AB
Specific Gravity, Urine: 1.012 (ref 1.005–1.030)
pH: 7 (ref 5.0–8.0)

## 2020-08-09 LAB — TROPONIN I (HIGH SENSITIVITY)
Troponin I (High Sensitivity): 51 ng/L — ABNORMAL HIGH
Troponin I (High Sensitivity): 66 ng/L — ABNORMAL HIGH

## 2020-08-09 LAB — CBC
HCT: 46.7 % — ABNORMAL HIGH (ref 36.0–46.0)
Hemoglobin: 15 g/dL (ref 12.0–15.0)
MCH: 29.8 pg (ref 26.0–34.0)
MCHC: 32.1 g/dL (ref 30.0–36.0)
MCV: 92.8 fL (ref 80.0–100.0)
Platelets: 399 10*3/uL (ref 150–400)
RBC: 5.03 MIL/uL (ref 3.87–5.11)
RDW: 14.1 % (ref 11.5–15.5)
WBC: 12.4 10*3/uL — ABNORMAL HIGH (ref 4.0–10.5)
nRBC: 0 % (ref 0.0–0.2)

## 2020-08-09 LAB — LIPASE, BLOOD: Lipase: 23 U/L (ref 11–51)

## 2020-08-09 LAB — T4, FREE: Free T4: 1.99 ng/dL — ABNORMAL HIGH (ref 0.61–1.12)

## 2020-08-09 LAB — SARS CORONAVIRUS 2 BY RT PCR (HOSPITAL ORDER, PERFORMED IN ~~LOC~~ HOSPITAL LAB): SARS Coronavirus 2: NEGATIVE

## 2020-08-09 LAB — TSH: TSH: 4.634 u[IU]/mL — ABNORMAL HIGH (ref 0.350–4.500)

## 2020-08-09 MED ORDER — ACETAMINOPHEN 650 MG RE SUPP: 650.0000 mg | Freq: Four times a day (QID) | RECTAL | Status: DC | PRN

## 2020-08-09 MED ORDER — PROPAFENONE HCL 225 MG PO TABS
225.0000 mg | ORAL_TABLET | Freq: Two times a day (BID) | ORAL | Status: DC
  Administered 2020-08-09 – 2020-08-11 (×4): 225 mg via ORAL
  Filled 2020-08-09 (×5): qty 1

## 2020-08-09 MED ORDER — ACETAMINOPHEN 325 MG PO TABS: 650.0000 mg | ORAL_TABLET | Freq: Four times a day (QID) | ORAL | Status: DC | PRN

## 2020-08-09 MED ORDER — ATORVASTATIN CALCIUM 10 MG PO TABS
20.0000 mg | ORAL_TABLET | Freq: Every day | ORAL | Status: DC
  Administered 2020-08-10 – 2020-08-11 (×2): 20 mg via ORAL
  Filled 2020-08-09 (×2): qty 2

## 2020-08-09 MED ORDER — HYDRALAZINE HCL 25 MG PO TABS: 25.0000 mg | ORAL_TABLET | Freq: Three times a day (TID) | ORAL | Status: DC | PRN

## 2020-08-09 MED ORDER — SODIUM CHLORIDE 0.9 % IV SOLN: INTRAVENOUS | Status: DC

## 2020-08-09 MED ORDER — MAGNESIUM SULFATE IN D5W 1-5 GM/100ML-% IV SOLN
1.0000 g | Freq: Once | INTRAVENOUS | Status: AC
  Administered 2020-08-09: 1 g via INTRAVENOUS
  Filled 2020-08-09: qty 100

## 2020-08-09 MED ORDER — SODIUM CHLORIDE 0.9% FLUSH
3.0000 mL | Freq: Two times a day (BID) | INTRAVENOUS | Status: DC
  Administered 2020-08-10: 3 mL via INTRAVENOUS

## 2020-08-09 MED ORDER — ONDANSETRON HCL 4 MG/2ML IJ SOLN
4.0000 mg | Freq: Once | INTRAMUSCULAR | Status: AC
  Administered 2020-08-09: 4 mg via INTRAVENOUS
  Filled 2020-08-09: qty 2

## 2020-08-09 MED ORDER — CARVEDILOL 6.25 MG PO TABS
6.2500 mg | ORAL_TABLET | Freq: Two times a day (BID) | ORAL | Status: DC
  Administered 2020-08-09 – 2020-08-11 (×5): 6.25 mg via ORAL
  Filled 2020-08-09 (×5): qty 1

## 2020-08-09 MED ORDER — AMLODIPINE BESYLATE 5 MG PO TABS
5.0000 mg | ORAL_TABLET | Freq: Every morning | ORAL | Status: DC
  Administered 2020-08-10 – 2020-08-11 (×2): 5 mg via ORAL
  Filled 2020-08-09 (×2): qty 1

## 2020-08-09 MED ORDER — RIVAROXABAN 15 MG PO TABS
15.0000 mg | ORAL_TABLET | Freq: Every day | ORAL | Status: DC
  Administered 2020-08-10 – 2020-08-11 (×2): 15 mg via ORAL
  Filled 2020-08-09 (×3): qty 1

## 2020-08-09 MED ORDER — SODIUM CHLORIDE 0.9 % IV BOLUS
1000.0000 mL | Freq: Once | INTRAVENOUS | Status: AC
  Administered 2020-08-09: 1000 mL via INTRAVENOUS

## 2020-08-09 MED ORDER — SODIUM ZIRCONIUM CYCLOSILICATE 10 G PO PACK
10.0000 g | PACK | Freq: Once | ORAL | Status: AC
  Administered 2020-08-09: 10 g via ORAL
  Filled 2020-08-09: qty 1

## 2020-08-09 NOTE — ED Provider Notes (Signed)
[facility] DEPARTMENT Provider Note   CSN: [id number] Arrival date & time: 08/09/20  4174     History Chief Complaint  Patient presents with  . Emesis  . Diarrhea  . Weakness    [name] is a 74 y.o. female.  Patient states that she woke up this morning around 330 which is normal for her.  She went to take some of her medication and then shortly afterwards started to have nausea vomiting diarrhea.  Has had several episodes but have now improved.  She denies any chest pain, shortness of breath.  She has had some generalized fatigue recently.  She is scheduled for CT scan tomorrow as she does have a history of breast cancer and lymphoma.  CT scan to evaluate if there is any oncology process ongoing.  She has had issues with her thyroid recently as well.  She denies any recent antibiotics.  No obvious suspicious food intake.  Denies any abdominal pain as well.  Stool was watery.  No blood in the stool or black stools.  The history is provided by the patient.  Emesis Severity:  Mild Timing:  Intermittent Progression:  Improving Chronicity:  New Recent urination:  Normal Relieved by:  Nothing Worsened by:  Nothing Associated symptoms: diarrhea   Associated symptoms: no abdominal pain, no arthralgias, no chills, no cough, no fever, no sore throat and no URI   Risk factors: no suspect food intake        Past Medical History:  Diagnosis Date  . Anemia   . Arthritis    fingers   . Blood transfusion    1968  . Breast cancer ([place]) 2009   T1N0M0 DCIS right breast  . Breast cancer ([place]) 2013   a. reocurrence right breast;  b. s/p Taxotere, Cytoxan and XRT  . Carotid stenosis    a. dopplers 0/81:  RICA 4-48%, LICA 18-56% => repeat due in 01/2013;  b.  Carotid US (3/14): RICA 9-70%; LICA 26-37% - f/u 6 mos  . Chronic diastolic CHF (congestive heart failure) ([place]) 11/28/2011   Echo 09/18/2018:  EF 55-60, no RWMA, Gr 2 DD, mod MR, mild TR  .  Fracture of left patella 12/2016  . History of radiation therapy 07/10/12-08/13/12   right breast/chest wall  . Hodgkin's disease    stage 2b;  s/p XRT 1988  . HTN (hypertension)   . Hypothyroidism    2/2 XRT for Hodgkins Lymphoma  . LBBB (left bundle branch block)   . Mucositis 04/29/2012  . NICM (nonischemic cardiomyopathy) ([place])    a. EF as low as 45%;  b. LHC 5/05:  LM 10%, pLAD 30%, EF 45-50%,   c. Echo 5/13:  EF 60-65%, Gr 1 diast dysfn, MAC;   d.  Echo 11/13:  EF 60-65%, Gr 1 diast dysfn, Tr AI, mild MR;  d.  Echo 05/10/13: EF 85-88%, grade 1 diastolic dysfunction, PASP 36, small effusion.  Marland Kitchen PAF (paroxysmal atrial fibrillation) ([place])    a. dx in 2008;  b. CHADS2-VASc=5;  c. changed to [place] during admx 05/2013; d. Xarelto started 05/2013; e. s/p successful Afib ablation 06/30/2017  . PEA (Pulseless electrical activity) ([place])    a. admx 05/2013 for APE in setting of HTN emergency c/b PEA arrest and asp pneumonia, VDRF, AFib with RVR  . Pneumonia 08/02/2018  . Raynaud's disease   . Status post radiation therapy 1988   mantle and periaortic     Patient Active Problem  List   Diagnosis Date Noted  . Menopause present 12/27/2018  . Neck pain 09/03/2018  . Renal insufficiency 08/19/2018  . HCAP (healthcare-associated pneumonia) 08/02/2018  . Malignant neoplasm of female breast ([place]) 07/23/2018  . Acute respiratory failure with hypoxia ([place])   . CAP (community acquired pneumonia) 06/16/2018  . NICM (nonischemic cardiomyopathy) ([place]) 08/15/2016  . PAF (paroxysmal atrial fibrillation) ([place]) 08/14/2016  . Pneumococcal pneumonia ([place]) 05/23/2013  . Pleural effusion 05/15/2013  . Carotid artery disease ([place]) 10/26/2012  . Chest wall recurrence of breast cancer ([place]) 07/09/2012  . Dyspnea 05/09/2012  . Normocytic anemia 05/09/2012  . Sinus tachycardia 04/29/2012  . Mucositis 04/29/2012  . Status post radiation therapy   . Breast mass, right 03/23/2012  . Chronic diastolic CHF  (congestive heart failure) ([place]) 11/28/2011  . Carotid bruit 11/28/2011  .  Cancer of Breast T1bN0M0 triple neg S/P mastectomy/reconstruction 04/2008 11/25/2011  . Carcinoma in situ of breast 05/02/2009  . Hypothyroidism 05/02/2009  . Essential hypertension 05/02/2009  . Secondary cardiomyopathy ([place]) 05/02/2009  . LBBB (left bundle branch block) 05/02/2009    Past Surgical History:  Procedure Laterality Date  . ATRIAL FIBRILLATION ABLATION  06/30/2017  . ATRIAL FIBRILLATION ABLATION N/A 06/30/2017   Procedure: Atrial Fibrillation Ablation;  Surgeon: Constance Haw, MD;  Location: [facility];  Service: Cardiovascular;  Laterality: N/A;  . BREAST LUMPECTOMY  2009   right, lymph node biopsy  . BTL  1971  . DILATION AND CURETTAGE OF UTERUS    . IR RADIOLOGY PERIPHERAL GUIDED IV START  06/23/2017  . IR US GUIDE VASC ACCESS LEFT  06/23/2017  . MASS EXCISION  03/28/2012   Procedure: EXCISION MASS;  Surgeon: Edward Jolly, MD;  Location: [facility];  Service: General;  Laterality: Right;  Excision right chest wall mass  . MASTECTOMY  2009   right breast  . POLYPECTOMY  1990   D&C (also in 1977 and [place])   . PORT-A-CATH REMOVAL  10/03/2012   Procedure: REMOVAL PORT-A-CATH;  Surgeon: Edward Jolly, MD;  Location: [facility];  Service: General;  Laterality: N/A;  . PORTACATH PLACEMENT  04/16/2012   Procedure: INSERTION PORT-A-CATH;  Surgeon: Edward Jolly, MD;  Location: [facility];  Service: General;  Laterality: Left;  Placement of Port-a-Cath, left subclavian  . R vein ligation & stripping  1976  . Spleenectomy  1988   for Hodgkin's disease  . Thoractomy  1968  . TISSUE EXPANDER  REMOVAL W/ REPLACEMENT OF IMPLANT    . TISSUE EXPANDER PLACEMENT     right breast  . TONSILLECTOMY AND ADENOIDECTOMY  1965     OB History   No obstetric history on file.     Family History  Problem Relation Age of Onset  . Hypertension Mother   . Cancer Neg Hx      Social History   Tobacco Use  . Smoking status: Never Smoker  . Smokeless tobacco: Never Used  Vaping Use  . Vaping Use: Never used  Substance Use Topics  . Alcohol use: No  . Drug use: No    Home Medications Prior to Admission medications   Medication Sig Start Date End Date Taking? Authorizing Provider  acetaminophen (TYLENOL) 500 MG tablet Take 500 mg by mouth every 8 (eight) hours as needed for mild pain, moderate pain or headache.    [provider]  amLODipine (NORVASC) 5 MG tablet TAKE 1 TABLET BY MOUTH EVERY DAY 02/10/20   Nishan,  Wallis Bamberg, MD  atorvastatin (LIPITOR) 20 MG tablet TAKE 1 TABLET BY MOUTH EVERY DAY AT 6PM 01/29/20   Josue Hector, MD  b complex vitamins capsule Take 1 capsule by mouth daily after breakfast.    [provider]  calcium carbonate (OS-CAL) 600 MG TABS Take 600 mg by mouth at bedtime.     [provider]  carvedilol (COREG) 12.5 MG tablet Take 1 tablet (12.5 mg total) by mouth 2 (two) times daily with a meal. TAKE 1 TABLET(12.5 MG TOTAL) BY MOUTH TWICE DAILY WITH A MEAL. [place] MD APPOINTMENT 02/10/20   Josue Hector, MD  Cholecalciferol (VITAMIN D) 2000 UNITS CAPS Take 2,000 Units by mouth daily with lunch.     [provider]  docusate sodium (COLACE) 100 MG capsule Take 100 mg by mouth 3 (three) times daily.    [provider]  furosemide (LASIX) 40 MG tablet Take 1 tablet (40 mg total) by mouth daily. 08/24/18   Hongalgi, Lenis Dickinson, MD  hydrALAZINE (APRESOLINE) 25 MG tablet Take 1 tablet (25 mg total) by mouth 3 (three) times daily. 09/26/18   Richardson Dopp T, PA-C  levothyroxine (SYNTHROID) 112 MCG tablet Take 112 mcg by mouth every morning. 01/21/20   [provider]  Multiple Vitamin (MULTIVITAMIN) tablet Take 1 tablet by mouth daily after breakfast. "Natural Vitamin"    [provider]  [place] BDZ tablets: Take 1 tablet by mouth at bedtime    [provider]  [place]: Take 3 capsules by mouth daily with lunch    [provider]  propafenone (RYTHMOL) 225 MG tablet TAKE 1 TABLET(225 MG) BY MOUTH TWICE DAILY 01/14/20   Camnitz, Ocie Doyne, MD  rivaroxaban (XARELTO) 20 MG TABS tablet TAKE 1 TABLET BY MOUTH DAILY WITH DINNER 03/03/20   Camnitz, Ocie Doyne, MD  UNABLE TO FIND PB assist 2 daily    [provider]  UNABLE TO FIND terrazyme two with meals    [provider]    Allergies    Ace inhibitors, Benazepril, Epinephrine, Potassium-containing compounds, and Taxotere [docetaxel]  Review of Systems   Review of Systems  Constitutional: Negative for chills and fever.  HENT: Negative for ear pain and sore throat.   Eyes: Negative for pain and visual disturbance.  Respiratory: Negative for cough and shortness of breath.   Cardiovascular: Negative for chest pain and palpitations.  Gastrointestinal: Positive for diarrhea and vomiting. Negative for abdominal pain.  Genitourinary: Negative for dysuria and hematuria.  Musculoskeletal: Negative for arthralgias and back pain.  Skin: Negative for color change and rash.  Neurological: Positive for weakness. Negative for seizures and syncope.  All other systems reviewed and are negative.   Physical Exam Updated Vital Signs  ED Triage Vitals  Enc Vitals Group     BP 08/09/20 0902 (!) 116/53     Pulse Rate 08/09/20 0902 88     Resp 08/09/20 0902 18     Temp 08/09/20 0902 97.7 F (36.5 C)     Temp Source 08/09/20 0902 Oral     SpO2 08/09/20 0902 97 %     Weight 08/09/20 0903 127 lb (57.6 kg)     Height 08/09/20 0903 _0  (1.549 m)     Head Circumference --      Peak Flow --      Pain Score 08/09/20 0901 0     Pain Loc --  Pain Edu? --      Excl. in [place]? --     Physical Exam Vitals and nursing note reviewed.  Constitutional:      General: She is not in acute distress.    Appearance: She is  well-developed. She is not ill-appearing.  HENT:     Head: Normocephalic and atraumatic.     Nose: Nose normal.     Mouth/Throat:     Mouth: Mucous membranes are dry.  Eyes:     Extraocular Movements: Extraocular movements intact.     Conjunctiva/sclera: Conjunctivae normal.     Pupils: Pupils are equal, round, and reactive to light.  Cardiovascular:     Rate and Rhythm: Normal rate and regular rhythm.     Pulses: Normal pulses.     Heart sounds: Normal heart sounds. No murmur heard.   Pulmonary:     Effort: Pulmonary effort is normal. No respiratory distress.     Breath sounds: Normal breath sounds.  Abdominal:     General: Abdomen is flat.     Palpations: Abdomen is soft.     Tenderness: There is no abdominal tenderness.  Musculoskeletal:     Cervical back: Normal range of motion and neck supple.  Skin:    General: Skin is warm and dry.     Capillary Refill: Capillary refill takes less than 2 seconds.  Neurological:     General: No focal deficit present.     Mental Status: She is alert.     Cranial Nerves: No cranial nerve deficit.     Sensory: No sensory deficit.     Coordination: Coordination normal.     Comments: 5+ out of 5 strength throughout, normal sensation, no drift, normal finger-nose-finger     ED Results / Procedures / Treatments   Labs (all labs ordered are listed, but only abnormal results are displayed) Labs Reviewed  COMPREHENSIVE METABOLIC PANEL - Abnormal; Notable for the following components:      Result Value   Glucose, Bld 123 (*)    BUN 33 (*)    Creatinine, Ser 1.75 (*)    AST 74 (*)    ALT 82 (*)    Alkaline Phosphatase 166 (*)    GFR calc non Af Amer 28 (*)    GFR calc Af Amer 33 (*)    All other components within normal limits  CBC - Abnormal; Notable for the following components:   WBC 12.4 (*)    HCT 46.7 (*)    All other components within normal limits  TSH - Abnormal; Notable for the following components:   TSH 4.634 (*)    All  other components within normal limits  T4, FREE - Abnormal; Notable for the following components:   Free T4 1.99 (*)    All other components within normal limits  BASIC METABOLIC PANEL - Abnormal; Notable for the following components:   Potassium 5.9 (*)    BUN 34 (*)    Creatinine, Ser 1.61 (*)    GFR calc non Af Amer 31 (*)    GFR calc Af Amer 36 (*)    All other components within normal limits  TROPONIN I (HIGH SENSITIVITY) - Abnormal; Notable for the following components:   Troponin I (High Sensitivity) 51 (*)    All other components within normal limits  TROPONIN I (HIGH SENSITIVITY) - Abnormal; Notable for the following components:   Troponin I (High Sensitivity) 66 (*)    All other components within normal limits  SARS CORONAVIRUS 2 BY RT PCR (HOSPITAL ORDER, [facility])  LIPASE, BLOOD  URINALYSIS, ROUTINE W REFLEX MICROSCOPIC    EKG EKG Interpretation  Date/Time:  Sunday August 09 2020 11:53:19 EDT Ventricular Rate:  84 PR Interval:    QRS Duration: 161 QT Interval:  430 QTC Calculation: 509 R Axis:   -69 Text Interpretation: Sinus rhythm Multiple ventricular premature complexes Left bundle branch block No significant change since last tracing Reconfirmed by Lennice Sites [phone]) on 08/09/2020 1:20:25 PM   Radiology DG Chest Portable 1 View  Result Date: 08/09/2020 CLINICAL DATA:  Nausea, vomiting, dizziness, history of breast cancer EXAM: PORTABLE CHEST 1 VIEW COMPARISON:  07/30/2020 chest radiograph. FINDINGS: Surgical clips overlie the low left neck. Stable cardiomediastinal silhouette with normal heart size. No pneumothorax. Chronic mild blunting of the left costophrenic angles bilaterally, improved on the left. No pulmonary edema. Irregular pleural-based nodular opacities in the peripheral left mid lung are slightly increased. IMPRESSION: 1. Irregular pleural-based nodular opacities in the peripheral left mid lung, slightly increased.  Suggest chest CT with IV contrast for further evaluation to exclude metastatic disease in this patient with a history of breast cancer. 2. Chronic mild blunting of the left costophrenic angles, improved on the left, cannot exclude small pleural effusions. Electronically Signed   By: Ilona Sorrel M.D.   On: 08/09/2020 12:01    Procedures Procedures (including critical care time)  Medications Ordered in ED Medications  sodium zirconium cyclosilicate (LOKELMA) packet 10 g (has no administration in time range)  sodium chloride 0.9 % bolus 1,000 mL (0 mLs Intravenous Stopped 08/09/20 1339)  ondansetron (ZOFRAN) injection 4 mg (4 mg Intravenous Given 08/09/20 1209)    ED Course  I have reviewed the triage vital signs and the nursing notes.  Pertinent labs & imaging results that were available during my care of the patient were reviewed by me and considered in my medical decision making (see chart for details).    MDM Rules/Calculators/A&P                          [name] is a 74 year old female with history of hypertension, heart failure, breast cancer status post chemotherapy and radiation who presents to the ED with nausea, vomiting, diarrhea.  She has had some generalized weakness as well prior to this.  Unremarkable vitals.  No fever.  Overall patient woke up this morning about 10 hours ago with nausea vomiting diarrhea that has seemed to slow down.  No recent antibiotics.  No suspicious food intake.  Does not have any chest pain, shortness of breath, abdominal pain.  Has not been able to eat or drink anything as she has not been feeling well.  EKG shows sinus rhythm with PVCs.  She had lab work done prior to my evaluation that is showing a mildly elevated creatinine at 1.75.  Last creatinine in our system was about 2 years ago and was 0.8.  Troponin is mildly elevated at 50 but given her history this is likely her baseline and will check a second troponin.  She is not having any active  chest pain.  EKG is reassuring from an ischemic standpoint.  Liver enzymes are mildly elevated likely in the setting of dehydration and nausea and vomiting.  She does not have any abdominal tenderness on exam.  She is a mild leukocytosis but no significant anemia.  Will give IV fluids, IV Zofran and allow her  to eat and drink and repeat BMP.  She does have a chest x-ray that shows increased size of pleural-based nodular opacities which in this patient could be concerning for metastatic disease especially with her history of breast cancer.  She is actually supposed to have a CT scan with IV contrast for this reason tomorrow but believe this will need to be delayed.  Overall patient appears well, is hemodynamically stable.  Suspect mild AKI from dehydration.  Will give a fluid bolus and recheck BMP.  Have discussed this plan with hospitalist and we both agree that if kidney function is improving slightly improved and patient is able to tolerate p.o. can have her follow-up closely with primary care doctor to recheck kidney function.  Patient is agreeable to this plan as well.  She is neurologically intact also.  Awaiting reevaluation after IV fluids and lab work.  Creatinine mildly improved to 1.61.  Potassium 5.9 but likely from hemolysis.  Will give Lokelma.  Patient on reevaluation still has had some improvement and has been able to eat and drink but was too weak to get up and go to the bathroom.  Had patient stand still ambulate in the room she comes very tachycardic to the 150s.  Pulse ox on room air is 90s and appears to stay that way when she ambulates.  Overall believe that she is still volume depleted but given that she has heart failure believe she will need gental hydration.  Do not believe a cardiac process is going on.  Overall, believe patient with AKI secondary to dehydration.  Possibly ongoing oncology process as well.  Will be admitted to hospitalist service.  This chart was dictated using voice  recognition software.  Despite best efforts to proofread,  errors can occur which can change the documentation meaning.    Final Clinical Impression(s) / ED Diagnoses Final diagnoses:  AKI (acute kidney injury) ([place])  Dehydration  Nausea vomiting and diarrhea  Generalized weakness    Rx / DC Orders ED Discharge Orders    None       Lennice Sites, DO 08/09/20 1501

## 2020-08-09 NOTE — ED Notes (Signed)
Urine sent to lab WITH culture.

## 2020-08-09 NOTE — ED Notes (Signed)
Pt given [place] sandwich and hot tea

## 2020-08-09 NOTE — H&P (Signed)
History and Physical    [name] ZSM:[id number] DOB: 06-12-1946 DOA: 08/09/2020  Referring MD/NP/PA: Lennice Sites, MD PCP: [name], MD  Patient coming from: Home  Chief Complaint: Nausea, vomiting, diarrhea  I have personally briefly reviewed patient's old medical records in [place]   HPI: [name] is a 74 y.o. female with medical history significant of breast cancer with recurrence s/p chemoradiation, Hodgkin's lymphoma s/p mantle cell radiation and 1988, paroxysmal atrial fibrillation s/p ablation, hypertension, and hypothyroidism after waking up this morning around 3 to take her increased dose of levothyroxine.  She had just recently had lab work done on July 30 with her primary care provider.  She was told that her thyroid levels were abnormal and her kidney function was up just a little bit.  Her dose of levothyroxine was increased from 112 to 125 mcg daily.  She tried taking the medication this morning, but felt like it possibly went down the wrong way.  Shortly thereafter patient started having nausea and vomited.  Patient became very diaphoretic and felt like she was going to pass out and laid floor.  Thereafter reported having any loose bowel movement with episodes of vomiting.  Patient notes that since June 20 she has not been feeling well,  lacked energy, decreased appetite, intermittent changes in her breathing pattern, and lost approximately 3 pounds over the last few weeks.    Patient also noted some right lower chest wall pain and tenderness to palpation.  Her primary had ordered a chest x-ray which showed concern for increased pleural thickening and nodularity within the left lateral pneumothorax for which a CT scan was recommended given her history of breast cancer.  Patient was scheduled to get this CT scan tomorrow agrees for imaging.  She was exposed time to secondhand smoke from her husband.   ED Course: Upon admission into the emergency  department patient was seen to be afebrile with blood pressures 116/53-160/71, and all other vital signs maintained.  Labs significant for WBC 12.4 BUN 33, creatinine 1.75 (baseline previously 0.8 in 2019), ALP 166, AST 74, ALT 82, high-sensitivity troponin 51->66, TSH 4.634, and free T4 1.99.  COVID-19 screening was negative.  Chest x-ray showed irregular pleural-based nodular opacities in the left midlung slightly increased for which CT with IV contrast was suggested to exclude metastatic disease.  Patient was given 1 L of IV fluids and request for initially deferred as patient thought she may want to go home.   Repeat labs showed improvement in creatinine 1.61.  However, patient was noted to be unsteady on feet and admission was recommended.  TRH called to admit.  Patient had not had any recurrence of nausea or vomiting or diarrhea since being in the hospital.  Review of Systems  Constitutional: Positive for malaise/fatigue. Negative for fever.  HENT: Negative for congestion and nosebleeds.   Eyes: Negative for photophobia and pain.  Respiratory: Positive for shortness of breath.   Cardiovascular: Positive for chest pain (Right chest wall). Negative for leg swelling.  Gastrointestinal: Positive for diarrhea, nausea and vomiting.  Genitourinary: Negative for dysuria and frequency.  Musculoskeletal: Positive for myalgias. Negative for falls.  Skin: Negative for rash.  Neurological: Positive for weakness. Negative for loss of consciousness.  Endo/Heme/Allergies: Negative for polydipsia.  Psychiatric/Behavioral: Negative for memory loss and substance abuse.    Past Medical History:  Diagnosis Date  . Anemia   . Arthritis    fingers   . Blood transfusion  1968  . Breast cancer ([place]) 2009   T1N0M0 DCIS right breast  . Breast cancer ([place]) 2013   a. reocurrence right breast;  b. s/p Taxotere, Cytoxan and XRT  . Carotid stenosis    a. dopplers 2/11:  RICA 9-41%, LICA 74-08% => repeat due in  01/2013;  b.  Carotid US (1/44): RICA 8-18%; LICA 56-31% - f/u 6 mos  . Chronic diastolic CHF (congestive heart failure) ([place]) 11/28/2011   Echo 09/18/2018:  EF 55-60, no RWMA, Gr 2 DD, mod MR, mild TR  . Fracture of left patella 12/2016  . History of radiation therapy 07/10/12-08/13/12   right breast/chest wall  . Hodgkin's disease    stage 2b;  s/p XRT 1988  . HTN (hypertension)   . Hypothyroidism    2/2 XRT for Hodgkins Lymphoma  . LBBB (left bundle branch block)   . Mucositis 04/29/2012  . NICM (nonischemic cardiomyopathy) ([place])    a. EF as low as 45%;  b. LHC 5/05:  LM 10%, pLAD 30%, EF 45-50%,   c. Echo 5/13:  EF 60-65%, Gr 1 diast dysfn, MAC;   d.  Echo 11/13:  EF 60-65%, Gr 1 diast dysfn, Tr AI, mild MR;  d.  Echo 05/10/13: EF 49-70%, grade 1 diastolic dysfunction, PASP 36, small effusion.  Marland Kitchen PAF (paroxysmal atrial fibrillation) ([place])    a. dx in 2008;  b. CHADS2-VASc=5;  c. changed to [place] during admx 05/2013; d. Xarelto started 05/2013; e. s/p successful Afib ablation 06/30/2017  . PEA (Pulseless electrical activity) ([place])    a. admx 05/2013 for APE in setting of HTN emergency c/b PEA arrest and asp pneumonia, VDRF, AFib with RVR  . Pneumonia 08/02/2018  . Raynaud's disease   . Status post radiation therapy 1988   mantle and periaortic     Past Surgical History:  Procedure Laterality Date  . ATRIAL FIBRILLATION ABLATION  06/30/2017  . ATRIAL FIBRILLATION ABLATION N/A 06/30/2017   Procedure: Atrial Fibrillation Ablation;  Surgeon: Constance Haw, MD;  Location: [facility];  Service: Cardiovascular;  Laterality: N/A;  . BREAST LUMPECTOMY  2009   right, lymph node biopsy  . BTL  1971  . DILATION AND CURETTAGE OF UTERUS    . IR RADIOLOGY PERIPHERAL GUIDED IV START  06/23/2017  . IR US GUIDE VASC ACCESS LEFT  06/23/2017  . MASS EXCISION  03/28/2012   Procedure: EXCISION MASS;  Surgeon: Edward Jolly, MD;  Location: [facility];  Service: General;   Laterality: Right;  Excision right chest wall mass  . MASTECTOMY  2009   right breast  . POLYPECTOMY  1990   D&C (also in 1977 and [place])   . PORT-A-CATH REMOVAL  10/03/2012   Procedure: REMOVAL PORT-A-CATH;  Surgeon: Edward Jolly, MD;  Location: [facility];  Service: General;  Laterality: N/A;  . PORTACATH PLACEMENT  04/16/2012   Procedure: INSERTION PORT-A-CATH;  Surgeon: Edward Jolly, MD;  Location: [facility];  Service: General;  Laterality: Left;  Placement of Port-a-Cath, left subclavian  . R vein ligation & stripping  1976  . Spleenectomy  1988   for Hodgkin's disease  . Thoractomy  1968  . TISSUE EXPANDER  REMOVAL W/ REPLACEMENT OF IMPLANT    . TISSUE EXPANDER PLACEMENT     right breast  . TONSILLECTOMY AND ADENOIDECTOMY  1965     reports that she has never smoked. She has never used smokeless tobacco. She reports that she does not  drink alcohol and does not use drugs.  Allergies  Allergen Reactions  . Ace Inhibitors Cough  . Benazepril Cough  . Epinephrine Other (See Comments)    Feels "buzzed" and heart rate increases  . Potassium-Containing Compounds Nausea And Vomiting    Projectile vomiting--liquid potassium   . Taxotere [Docetaxel] Other (See Comments)    Fluid overload    Family History  Problem Relation Age of Onset  . Hypertension Mother   . Cancer Neg Hx     Prior to Admission medications   Medication Sig Start Date End Date Taking? Authorizing Provider  acetaminophen (TYLENOL) 500 MG tablet Take 500 mg by mouth every 8 (eight) hours as needed for mild pain, moderate pain or headache.    [provider]  amLODipine (NORVASC) 5 MG tablet TAKE 1 TABLET BY MOUTH EVERY DAY 02/10/20   Josue Hector, MD  atorvastatin (LIPITOR) 20 MG tablet TAKE 1 TABLET BY MOUTH EVERY DAY AT 6PM 01/29/20   Josue Hector, MD  b complex vitamins capsule Take 1 capsule by mouth daily after breakfast.    [provider]  calcium carbonate (OS-CAL) 600 MG  TABS Take 600 mg by mouth at bedtime.     [provider]  carvedilol (COREG) 12.5 MG tablet Take 1 tablet (12.5 mg total) by mouth 2 (two) times daily with a meal. TAKE 1 TABLET(12.5 MG TOTAL) BY MOUTH TWICE DAILY WITH A MEAL. [place] MD APPOINTMENT 02/10/20   Josue Hector, MD  Cholecalciferol (VITAMIN D) 2000 UNITS CAPS Take 2,000 Units by mouth daily with lunch.     [provider]  docusate sodium (COLACE) 100 MG capsule Take 100 mg by mouth 3 (three) times daily.    [provider]  furosemide (LASIX) 40 MG tablet Take 1 tablet (40 mg total) by mouth daily. 08/24/18   Hongalgi, Lenis Dickinson, MD  hydrALAZINE (APRESOLINE) 25 MG tablet Take 1 tablet (25 mg total) by mouth 3 (three) times daily. 09/26/18   Richardson Dopp T, PA-C  levothyroxine (SYNTHROID) 112 MCG tablet Take 112 mcg by mouth every morning. 01/21/20   [provider]  Multiple Vitamin (MULTIVITAMIN) tablet Take 1 tablet by mouth daily after breakfast. "Natural Vitamin"    [provider]  [place] BDZ tablets: Take 1 tablet by mouth at bedtime    [provider]  [place]: Take 3 capsules by mouth daily with lunch    [provider]  propafenone (RYTHMOL) 225 MG tablet TAKE 1 TABLET(225 MG) BY MOUTH TWICE DAILY 01/14/20   Camnitz, Ocie Doyne, MD  rivaroxaban (XARELTO) 20 MG TABS tablet TAKE 1 TABLET BY MOUTH DAILY WITH DINNER 03/03/20   Camnitz, Will Hassell Done, MD  UNABLE TO FIND PB assist 2 daily    [provider]  UNABLE TO FIND terrazyme two with meals    [provider]    Physical Exam:  Constitutional: Elderly female who appears to be in no acute distress Vitals:   08/09/20 0902 08/09/20 0903 08/09/20 1215  BP: (!) 116/53  (!) 160/71  Pulse: 88  88  Resp: 18  16  Temp: 97.7 F (36.5 C)    TempSrc: Oral    SpO2: 97%  90%  Weight:  57.6 kg   Height:  _0  (1.549 m)    Eyes:  PERRL, lids and conjunctivae normal ENMT: Mucous membranes are moist. Posterior pharynx clear of any exudate or  lesions.   Neck: normal, supple, no masses, no thyromegaly Respiratory: Normal respiratory effort with intermittent crackles appreciated.  Currently O2 saturation maintained on room air. Cardiovascular: Regular rate with intermittent PVCs, no murmurs / rubs / gallops. No extremity edema. 2+ pedal pulses. No carotid bruits.  Tenderness to palpation of the right lower lobe chest wall. Abdomen: no tenderness, no masses palpated. No hepatosplenomegaly. Bowel sounds positive.  Musculoskeletal: no clubbing / cyanosis. No joint deformity upper and lower extremities. Good ROM, no contractures. Normal muscle tone.  Skin: no rashes, lesions, ulcers. No induration Neurologic: CN 2-12 grossly intact. Sensation intact, DTR normal. Strength 5/5 in all 4.  Psychiatric: Normal judgment and insight. Alert and oriented x 3. Normal mood.     Labs on Admission: I have personally reviewed following labs and imaging studies  CBC: Recent Labs  Lab 08/09/20 0938  WBC 12.4*  HGB 15.0  HCT 46.7*  MCV 92.8  PLT 440   Basic Metabolic Panel: Recent Labs  Lab 08/09/20 0938  NA 136  K 4.6  CL 98  CO2 25  GLUCOSE 123*  BUN 33*  CREATININE 1.75*  CALCIUM 9.9   GFR: Estimated Creatinine Clearance: 23 mL/min (A) (by C-G formula based on SCr of 1.75 mg/dL (H)). Liver Function Tests: Recent Labs  Lab 08/09/20 0938  AST 74*  ALT 82*  ALKPHOS 166*  BILITOT 1.1  PROT 6.9  ALBUMIN 3.8   Recent Labs  Lab 08/09/20 0938  LIPASE 23   No results for input(s): AMMONIA in the last 168 hours. Coagulation Profile: No results for input(s): INR, PROTIME in the last 168 hours. Cardiac Enzymes: No results for input(s): CKTOTAL, CKMB, CKMBINDEX, TROPONINI in the last 168 hours. BNP (last 3 results) No results for input(s): PROBNP in the last 8760 hours. HbA1C: No results for input(s): HGBA1C in  the last 72 hours. CBG: No results for input(s): GLUCAP in the last 168 hours. Lipid Profile: No results for input(s): CHOL, HDL, LDLCALC, TRIG, CHOLHDL, LDLDIRECT in the last 72 hours. Thyroid Function Tests: No results for input(s): TSH, T4TOTAL, FREET4, T3FREE, THYROIDAB in the last 72 hours. Anemia Panel: No results for input(s): VITAMINB12, FOLATE, FERRITIN, TIBC, IRON, RETICCTPCT in the last 72 hours. Urine analysis:    Component Value Date/Time   COLORURINE YELLOW 08/19/2018 0301   APPEARANCEUR HAZY (A) 08/19/2018 0301   LABSPEC 1.015 08/19/2018 0301   PHURINE 6.0 08/19/2018 0301   GLUCOSEU 50 (A) 08/19/2018 0301   HGBUR NEGATIVE 08/19/2018 0301   BILIRUBINUR NEGATIVE 08/19/2018 0301   KETONESUR NEGATIVE 08/19/2018 0301   PROTEINUR 100 (A) 08/19/2018 0301   UROBILINOGEN 0.2 05/10/2013 0438   NITRITE NEGATIVE 08/19/2018 0301   LEUKOCYTESUR NEGATIVE 08/19/2018 0301   Sepsis Labs: No results found for this or any previous visit (from the past 240 hour(s)).   Radiological Exams on Admission: DG Chest Portable 1 View  Result Date: 08/09/2020 CLINICAL DATA:  Nausea, vomiting, dizziness, history of breast cancer EXAM: PORTABLE CHEST 1 VIEW COMPARISON:  07/30/2020 chest radiograph. FINDINGS: Surgical clips overlie the low left neck. Stable cardiomediastinal silhouette with normal heart size. No pneumothorax. Chronic mild blunting of the left costophrenic angles bilaterally, improved on the left. No pulmonary edema. Irregular pleural-based nodular opacities in the peripheral left mid lung are slightly increased. IMPRESSION: 1. Irregular pleural-based nodular opacities in the peripheral left mid lung, slightly increased. Suggest chest CT with IV contrast for further evaluation to exclude metastatic disease in this patient with a history of breast  cancer. 2. Chronic mild blunting of the left costophrenic angles, improved on the left, cannot exclude small pleural effusions. Electronically  Signed   By: Ilona Sorrel M.D.   On: 08/09/2020 12:01    EKG: Independently reviewed.  Sinus rhythm 84 bpm  Assessment/Plan Nausea, vomiting, diarrhea: Acute.  Patient with complaints of nausea, vomiting, and diarrhea this morning.  Exact cause of patient's symptoms not totally clear at this time but no recurrence of symptoms since being admitted into the hospital. -Admit to a medical telemetry bed -Diet as tolerated -Avoid antiemetics due to prolonged QT -Gentle IV fluids at 50 mL/h overnight  Leukocytosis: Acute.  WBC elevated up to 12.4.  Urinalysis did not show clear signs of infection.   -Recheck   Acute kidney injury: Patient's baseline creatinine previously noted to be around 0.8 back in 2019.  Creatinine on admission however elevated to 1.73 with BUN 36.  She is given 1 liter of normal saline IVFs and repeat check creatinine was 1.6. -IV fluids as seen above -Hold nephrotoxic agents such as diuretics   -Recheck kidney function in a.m.  Elevated troponin: Acute.  Patient does not report any complaints of chest pain.  Initial troponin 51 and repeat 66. -Follow-up telemetry overnight -Continue statin  Abnormal chest x-ray Breast cancer/Hodgkin's lymphoma: Patient with previous history of breast cancer status post chemotherapy radiation, and Hodgkin's lymphoma status post mantle cell radiation.  Chest x-ray revealed concern for possible metastatic disease after finding irregular pleural-based nodular opacities in the periphery of the left lung.  Patient appears to have already been set up for a CT scan of the chest with contrast to evaluate further.  However, unable to further evaluate that at this time due to kidney function. -Determine if able to obtain CT scan of the chest with contrast to further evaluate abnormality seen on chest x-ray  Prolonged QT interval: Acute.  Patient presents with QTc 509 with multiple PVCs. -Correct electrolyte abnormalities -Avoid QT prolonging  medications   Paroxysmal atrial fibrillation chronic anticoagulation: Patient has a prior history of paroxysmal A. fib fibrillation.  Follow-up in outpatient setting by Dr. Curt Bears with previous history of ablation. -Continue propafenone and Xarelto  Hypothyroidism: Patient with hypothyroidism secondary to mantle cell radiation.  Initial lab work revealed TSH of 4.634 with free T4 1.99.  Patient reporting that her dose was just recently increased from 112 mcg to 125 mcg daily. -Based off the patient's labs would not think levothyroxine should be increased at this time.  Diastolic congestive heart: Patient does not appear to be volume overloaded at this time.  Last EF noted to be 55 to 60% with grade 2 diastolic dysfunction back in 09/2018. -Strict intake and output -Daily weights   Essential hypertension: Blood pressures currently maintained.  Home blood pressure medications include amlodipine 5 mg daily, Coreg 6.25 mg twice daily, 40 mg daily, hydralazine 25 mg 3 times daily if systolic blood pressure greater than 110 and propafenone 225 mg twice daily. -Continue home regimen as tolerated  DVT prophylaxis: Xarelto Code Status: Full Family Communication: No family requested to be updated at this time. Disposition Plan: Possible discharge home tomorrow Consults called: None Admission status: Observation  Norval Morton MD [facility] Pager [phone]   If 7PM-7AM, please contact night-coverage [email or web address] Password [facility]  08/09/2020, 12:33 PM

## 2020-08-09 NOTE — ED Triage Notes (Signed)
Patient arrived by [facility] following acute onset of vomiting and diarrhea that started this am 0300. Patient denies pain and thinks related to ongoing endocrine problem with her thyroid. Alert and oriented.

## 2020-08-10 ENCOUNTER — Other Ambulatory Visit: Payer: Medicare Other

## 2020-08-10 DIAGNOSIS — N179 Acute kidney failure, unspecified: Secondary | ICD-10-CM | POA: Diagnosis present

## 2020-08-10 DIAGNOSIS — I700 Atherosclerosis of aorta: Secondary | ICD-10-CM | POA: Diagnosis not present

## 2020-08-10 DIAGNOSIS — I5032 Chronic diastolic (congestive) heart failure: Secondary | ICD-10-CM | POA: Diagnosis present

## 2020-08-10 DIAGNOSIS — T17990A Other foreign object in respiratory tract, part unspecified in causing asphyxiation, initial encounter: Secondary | ICD-10-CM | POA: Diagnosis present

## 2020-08-10 DIAGNOSIS — X58XXXA Exposure to other specified factors, initial encounter: Secondary | ICD-10-CM | POA: Diagnosis present

## 2020-08-10 DIAGNOSIS — I2510 Atherosclerotic heart disease of native coronary artery without angina pectoris: Secondary | ICD-10-CM | POA: Diagnosis not present

## 2020-08-10 DIAGNOSIS — Z888 Allergy status to other drugs, medicaments and biological substances status: Secondary | ICD-10-CM | POA: Diagnosis not present

## 2020-08-10 DIAGNOSIS — E038 Other specified hypothyroidism: Secondary | ICD-10-CM | POA: Diagnosis not present

## 2020-08-10 DIAGNOSIS — I447 Left bundle-branch block, unspecified: Secondary | ICD-10-CM | POA: Diagnosis present

## 2020-08-10 DIAGNOSIS — Z79890 Hormone replacement therapy (postmenopausal): Secondary | ICD-10-CM | POA: Diagnosis not present

## 2020-08-10 DIAGNOSIS — E860 Dehydration: Secondary | ICD-10-CM | POA: Diagnosis present

## 2020-08-10 DIAGNOSIS — R112 Nausea with vomiting, unspecified: Secondary | ICD-10-CM | POA: Diagnosis present

## 2020-08-10 DIAGNOSIS — Z8572 Personal history of non-Hodgkin lymphomas: Secondary | ICD-10-CM | POA: Diagnosis not present

## 2020-08-10 DIAGNOSIS — C50911 Malignant neoplasm of unspecified site of right female breast: Secondary | ICD-10-CM | POA: Diagnosis not present

## 2020-08-10 DIAGNOSIS — R160 Hepatomegaly, not elsewhere classified: Secondary | ICD-10-CM | POA: Diagnosis not present

## 2020-08-10 DIAGNOSIS — Z853 Personal history of malignant neoplasm of breast: Secondary | ICD-10-CM | POA: Diagnosis not present

## 2020-08-10 DIAGNOSIS — Z79899 Other long term (current) drug therapy: Secondary | ICD-10-CM | POA: Diagnosis not present

## 2020-08-10 DIAGNOSIS — Z923 Personal history of irradiation: Secondary | ICD-10-CM | POA: Diagnosis not present

## 2020-08-10 DIAGNOSIS — C782 Secondary malignant neoplasm of pleura: Secondary | ICD-10-CM | POA: Diagnosis not present

## 2020-08-10 DIAGNOSIS — Z9011 Acquired absence of right breast and nipple: Secondary | ICD-10-CM | POA: Diagnosis not present

## 2020-08-10 DIAGNOSIS — I110 Hypertensive heart disease with heart failure: Secondary | ICD-10-CM | POA: Diagnosis present

## 2020-08-10 DIAGNOSIS — Z8249 Family history of ischemic heart disease and other diseases of the circulatory system: Secondary | ICD-10-CM | POA: Diagnosis not present

## 2020-08-10 DIAGNOSIS — R197 Diarrhea, unspecified: Secondary | ICD-10-CM | POA: Diagnosis present

## 2020-08-10 DIAGNOSIS — I7300 Raynaud's syndrome without gangrene: Secondary | ICD-10-CM | POA: Diagnosis present

## 2020-08-10 DIAGNOSIS — I10 Essential (primary) hypertension: Secondary | ICD-10-CM | POA: Diagnosis not present

## 2020-08-10 DIAGNOSIS — J984 Other disorders of lung: Secondary | ICD-10-CM | POA: Diagnosis not present

## 2020-08-10 DIAGNOSIS — E039 Hypothyroidism, unspecified: Secondary | ICD-10-CM | POA: Diagnosis present

## 2020-08-10 DIAGNOSIS — Z7901 Long term (current) use of anticoagulants: Secondary | ICD-10-CM | POA: Diagnosis not present

## 2020-08-10 DIAGNOSIS — Z20822 Contact with and (suspected) exposure to covid-19: Secondary | ICD-10-CM | POA: Diagnosis present

## 2020-08-10 LAB — CBC
HCT: 38.8 % (ref 36.0–46.0)
Hemoglobin: 12.6 g/dL (ref 12.0–15.0)
MCH: 29.8 pg (ref 26.0–34.0)
MCHC: 32.5 g/dL (ref 30.0–36.0)
MCV: 91.7 fL (ref 80.0–100.0)
Platelets: 357 10*3/uL (ref 150–400)
RBC: 4.23 MIL/uL (ref 3.87–5.11)
RDW: 14.1 % (ref 11.5–15.5)
WBC: 11.7 10*3/uL — ABNORMAL HIGH (ref 4.0–10.5)
nRBC: 0 % (ref 0.0–0.2)

## 2020-08-10 LAB — BASIC METABOLIC PANEL
Anion gap: 12 (ref 5–15)
BUN: 30 mg/dL — ABNORMAL HIGH (ref 8–23)
CO2: 25 mmol/L (ref 22–32)
Calcium: 8.8 mg/dL — ABNORMAL LOW (ref 8.9–10.3)
Chloride: 103 mmol/L (ref 98–111)
Creatinine, Ser: 1.4 mg/dL — ABNORMAL HIGH (ref 0.44–1.00)
GFR calc Af Amer: 43 mL/min — ABNORMAL LOW (ref >60)
GFR calc non Af Amer: 37 mL/min — ABNORMAL LOW (ref >60)
Glucose, Bld: 98 mg/dL (ref 70–99)
Potassium: 4.1 mmol/L (ref 3.5–5.1)
Sodium: 140 mmol/L (ref 135–145)

## 2020-08-10 LAB — MAGNESIUM: Magnesium: 2.5 mg/dL — ABNORMAL HIGH (ref 1.7–2.4)

## 2020-08-10 NOTE — Progress Notes (Signed)
PROGRESS NOTE    [name]  OEU:[id number]  DOB: 1946-07-03  PCP: [name], MD  Admit date:08/09/2020 Chief compliant: Nausea vomiting diarrhea. 74 y.o. female with medical history significant of breast cancer with recurrence s/p chemoradiation, Hodgkin's lymphoma s/p mantle cell radiation and 1988, paroxysmal atrial fibrillation s/p ablation, hypertension, and hypothyroidism presented to the ED after she had a choking episode associated with nausea, vomiting and bringing up phlegm.  Patient states she has had progressive weakness since June, associated with loss of appetite and poor oral intake.  She went to PCP where she was diagnosed with uncontrolled hypothyroidism and Synthroid dosage was increased from 112 to 125 mcg daily.  She was taking this medication earlier this morning when her presenting symptoms started.  Patient reports associated diaphoresis and weakness which prompted her to lay down on the floor.  She denies any syncopal event.  She feels she swallowed the pill the wrong way.Patient also noted some right lower chest wall pain and tenderness to palpation.  Her primary had ordered a chest x-ray which showed concern for increased pleural thickening and nodularity within the left lateral pneumothorax for which a CT scan was recommended given her history of breast cancer.  Patient was scheduled to get this CT scan today.   ED Course: Afebrile, with blood pressures 116/53-160/71, and all other vital signs maintained.  Labs significant for WBC 12.4 BUN 33, creatinine 1.75 (baseline previously 0.8 in 2019), ALP 166, AST 74, ALT 82, high-sensitivity troponin 51->66, TSH 4.634, and free T4 1.99.  COVID-19 screening was negative.  Chest x-ray showed irregular pleural-based nodular opacities in the left midlung slightly increased for which CT with IV contrast was suggested to exclude metastatic disease.  Patient was given 1 L of IV fluids and request for initially deferred as patient  thought she may want to go home.   Repeat labs showed improvement in creatinine 1.61.  However, patient was noted to be unsteady on feet and admission was recommended.  Hospital course: Patient admitted to [facility] for further evaluation and management.  She was started on IV fluids for AKI.  Subjective:  Patient resting comfortably.  Denies any acute complaints.  She states her left-sided chest wall pain was mostly reproducible and positional.  Currently reports resolution of this pain unless she moves a certain way.  Son at bedside.  Objective: Vitals:   08/10/20 0827 08/10/20 1406 08/10/20 1700 08/10/20 1850  BP: (!) 121/50 (!) 130/57 (!) 150/72 135/67  Pulse: 87 79 88   Resp:  18 18   Temp:  98.3 F (36.8 C) 99.3 F (37.4 C)   TempSrc:  Oral Oral   SpO2:  90% 91%   Weight:      Height:        Intake/Output Summary (Last 24 hours) at 08/10/2020 1851 Last data filed at 08/10/2020 0500 Gross per 24 hour  Intake 724.05 ml  Output --  Net 724.05 ml   Filed Weights   08/09/20 0903  Weight: 57.6 kg    Physical Examination:  General: Thin built, no acute distress noted Head ENT: Atraumatic normocephalic, PERRLA, neck supple Heart: S1-S2 heard, regular rate and rhythm, no murmurs.  No leg edema noted Lungs: Equal air entry bilaterally, no rhonchi or rales on exam, no accessory muscle use Abdomen: Bowel sounds heard, soft, nontender, nondistended. No organomegaly.  No CVA tenderness Extremities: No pedal edema.  No cyanosis or clubbing. Neurological: Awake alert oriented x3, no focal weakness or numbness, strength  and sensations to crude touch intact Skin: No wounds or rashes.   Data Reviewed: I have personally reviewed following labs and imaging studies  CBC: Recent Labs  Lab 08/09/20 0938 08/10/20 0325  WBC 12.4* 11.7*  HGB 15.0 12.6  HCT 46.7* 38.8  MCV 92.8 91.7  PLT 399 254   Basic Metabolic Panel: Recent Labs  Lab 08/09/20 0938 08/09/20 1339 08/10/20 0325  NA  136 137 140  K 4.6 5.9* 4.1  CL 98 98 103  CO2 25 25 25   GLUCOSE 123* 96 98  BUN 33* 34* 30*  CREATININE 1.75* 1.61* 1.40*  CALCIUM 9.9 9.2 8.8*  MG  --   --  2.5*   GFR: Estimated Creatinine Clearance: 28.8 mL/min (A) (by C-G formula based on SCr of 1.4 mg/dL (H)). Liver Function Tests: Recent Labs  Lab 08/09/20 0938  AST 74*  ALT 82*  ALKPHOS 166*  BILITOT 1.1  PROT 6.9  ALBUMIN 3.8   Recent Labs  Lab 08/09/20 0938  LIPASE 23   No results for input(s): AMMONIA in the last 168 hours. Coagulation Profile: No results for input(s): INR, PROTIME in the last 168 hours. Cardiac Enzymes: No results for input(s): CKTOTAL, CKMB, CKMBINDEX, TROPONINI in the last 168 hours. BNP (last 3 results) No results for input(s): PROBNP in the last 8760 hours. HbA1C: No results for input(s): HGBA1C in the last 72 hours. CBG: No results for input(s): GLUCAP in the last 168 hours. Lipid Profile: No results for input(s): CHOL, HDL, LDLCALC, TRIG, CHOLHDL, LDLDIRECT in the last 72 hours. Thyroid Function Tests: Recent Labs    08/09/20 1208  TSH 4.634*  FREET4 1.99*   Anemia Panel: No results for input(s): VITAMINB12, FOLATE, FERRITIN, TIBC, IRON, RETICCTPCT in the last 72 hours. Sepsis Labs: No results for input(s): PROCALCITON, LATICACIDVEN in the last 168 hours.  Recent Results (from the past 240 hour(s))  SARS Coronavirus 2 by RT PCR (hospital order, performed in [facility] hospital lab) Nasopharyngeal Nasopharyngeal Swab     Status: None   Collection Time: 08/09/20 12:08 PM   Specimen: Nasopharyngeal Swab  Result Value Ref Range Status   SARS Coronavirus 2 NEGATIVE NEGATIVE Final    Comment: (NOTE) SARS-CoV-2 target nucleic acids are NOT DETECTED.  The SARS-CoV-2 RNA is generally detectable in upper and lower respiratory specimens during the acute phase of infection. The lowest concentration of SARS-CoV-2 viral copies this assay can detect is 250 copies / mL. A negative  result does not preclude SARS-CoV-2 infection and should not be used as the sole basis for treatment or other patient management decisions.  A negative result may occur with improper specimen collection / handling, submission of specimen other than nasopharyngeal swab, presence of viral mutation(s) within the areas targeted by this assay, and inadequate number of viral copies (<250 copies / mL). A negative result must be combined with clinical observations, patient history, and epidemiological information.  Fact Sheet for Patients:   StrictlyIdeas.no  Fact Sheet for Healthcare Providers: BankingDealers.co.za  This test is not yet approved or  cleared by the [place] FDA and has been authorized for detection and/or diagnosis of SARS-CoV-2 by FDA under an Emergency Use Authorization (EUA).  This EUA will remain in effect (meaning this test can be used) for the duration of the COVID-19 declaration under Section 564(b)(1) of the Act, 21 U.S.C. section 360bbb-3(b)(1), unless the authorization is terminated or revoked sooner.  Performed at [facility], [place] [address], [place], [place]  [address]       Radiology Studies: DG Chest Portable 1 View  Result Date: 08/09/2020 CLINICAL DATA:  Nausea, vomiting, dizziness, history of breast cancer EXAM: PORTABLE CHEST 1 VIEW COMPARISON:  07/30/2020 chest radiograph. FINDINGS: Surgical clips overlie the low left neck. Stable cardiomediastinal silhouette with normal heart size. No pneumothorax. Chronic mild blunting of the left costophrenic angles bilaterally, improved on the left. No pulmonary edema. Irregular pleural-based nodular opacities in the peripheral left mid lung are slightly increased. IMPRESSION: 1. Irregular pleural-based nodular opacities in the peripheral left mid lung, slightly increased. Suggest chest CT with IV contrast for further evaluation to exclude metastatic disease in  this patient with a history of breast cancer. 2. Chronic mild blunting of the left costophrenic angles, improved on the left, cannot exclude small pleural effusions. Electronically Signed   By: Ilona Sorrel M.D.   On: 08/09/2020 12:01      Scheduled Meds: . amLODipine  5 mg Oral q AM  . atorvastatin  20 mg Oral q1800  . carvedilol  6.25 mg Oral BID WC  . propafenone  225 mg Oral BID  . Rivaroxaban  15 mg Oral Q supper  . sodium chloride flush  3 mL Intravenous Q12H   Continuous Infusions: . sodium chloride 50 mL/hr at 08/10/20 1327      Assessment/Plan:  1.  Nausea, vomiting: Likely secondary to aspiration when patient swallowed the pill wrong way.  She also had two episodes of bowel movement (not close) with no recurrence.  She feels back to her baseline.  Tolerating regular diet now.  2.  Acute kidney injury: Present on admission likely secondary to dehydration in the setting of diuretic use and poor oral intake..  Creatinine 1.75 (baseline around 0.9).  Patient states she was on Lasix as needed until a year back when it was changed to daily for history of heart failure.  She has also had symptomatic hypothyroidism with low appetite and low oral intake.  Improving slowly with IV hydration.  Avoid nephrotoxins.  Will hydrate another night and repeat labs in AM.  3.  Hypothyroidism: Patient has abnormal thyroid profile (TSH up at 4.6, increased T4 at 1.9).  Synthroid dosage changed to 125 mcg.  Will check free T3 before resuming home medications.  4.  Mild troponinemia: In the setting of problem #1 and problem #2.  No complaints of chest pain.  EKG unremarkable except for prolonged QTC.  5.  Atrial fibrillation, nonsustained VT: Patient on propafenone for "37% V. tach burden" noted on Holter monitoring as outpatient.  Follows Dr. Roosvelt Harps and Dr. Curt Bears.  Will discuss with cardiology if okay to continue propafenone in the setting of prolonged QTC (509 ms).  Resume beta-blockers, oral  anticoagulation.  6.  Prolonged QTC: Avoid QT prolonging agents as discussed above.  Potassium 5.9 on presentation, 4.1 today.  Magnesium 2.5.  Repeat EKG for follow-up.  On beta-blockers.  7.  Hypertension: Resume home medications including Norvasc, Coreg, hydralazine (as needed).  Check orthostatics given patient's complaint of weakness  8.  History of diastolic CHF: Currently appears euvolemic or dry.  Last EF preserved in 2019 with grade 2 diastolic dysfunction.  Holding Lasix and concern for problem #2.  May likely need to resume on an as-needed basis.  9.  Breast cancer/Hodgkin's lymphoma:  Patient with previous history of breast cancer status post chemotherapy radiation, and Hodgkin's lymphoma status post mantle cell radiation.  Chest x-ray revealed concern for possible metastatic disease after finding irregular  pleural-based nodular opacities in the periphery of the left lung.  Patient appears to have already been set up for a CT scan of the chest with contrast to evaluate further.  However, unable to further evaluate that at this time due to kidney function.can likely follow-up as outpatient once renal function improves and stabilizes.   DVT prophylaxis: Xarelto Code Status:  full code Family / Patient Communication: Discussed with patient and with son at bedside Disposition Plan:   Status is: Inpatient  Remains inpatient appropriate because:IV treatments appropriate due to intensity of illness or inability to take PO.  Needs IV fluids for persistent AKI   Dispo: The patient is from: Home              Anticipated d/c is to: Home              Anticipated d/c date is: 1 day              Patient currently is not medically stable to d/c.           Time spent: 25 minutes     >50% time spent in discussions with care team and coordination of care.    Guilford Shi, MD [facility] Pager in [place]  If 7PM-7AM, please contact night-coverage [email or web address] 08/10/2020,  6:51 PM

## 2020-08-10 NOTE — Plan of Care (Signed)
  Problem: Education: Goal: Knowledge of General Education information will improve Description Including pain rating scale, medication(s)/side effects and non-pharmacologic comfort measures Outcome: Progressing   

## 2020-08-10 NOTE — Evaluation (Signed)
Physical Therapy Evaluation Patient Details Name: [name] MRN: [id number] DOB: 1946-06-24 Today's Date: 08/10/2020   History of Present Illness  74 yo female admitted to ED on 8/22 with N/V/D. CXR reveals increased size of pleural nodular opacities and possible small pleural effusions, history of cancer and follow-up CT to be performed. PMH includes breast cancer s/p R mastectomy, HF, hodgkin's disease s/p splenectomy, HTN, LBBB, NICM, raynaud's.  Clinical Impression   Pt presents with functional weakness, impaired dynamic standing balance, and decreased activity tolerance vs baseline. Pt to benefit from acute PT to address deficits. Pt ambulated hallway distance with intermittent single UE support, pt with + loss of balance x2 requiring PT assist to correct. Per pt, her son and daughter-in-law will stay with pt as needed post-acutely, and pt's neighbor will assist with caring for her dog while she recovers. PT discussed possible HHPT with pt, pt politely declines and PT agrees pt is close to mobility baseline and will do well with supervision for mobility from family. PT to progress mobility as tolerated, and will continue to follow acutely.      Follow Up Recommendations No PT follow up;Supervision for mobility/OOB    Equipment Recommendations  None recommended by PT    Recommendations for Other Services       Precautions / Restrictions Precautions Precautions: Fall Restrictions Weight Bearing Restrictions: No      Mobility  Bed Mobility Overal bed mobility: Needs Assistance Bed Mobility: Supine to Sit     Supine to sit: Modified independent (Device/Increase time)     General bed mobility comments: Mod I for increased time only, no physical assist  Transfers Overall transfer level: Needs assistance Equipment used: None Transfers: Sit to/from Stand Sit to Stand: Min guard         General transfer comment: for safety, pt reaching for IV pole to steady once  standing.  Ambulation/Gait Ambulation/Gait assistance: Min guard;Min assist Gait Distance (Feet): 200 Feet Assistive device: None;IV Pole Gait Pattern/deviations: Step-through pattern;Decreased stride length;Trunk flexed Gait velocity: decr   General Gait Details: min guard to supervision for safety, occasional min assist to correct lateral LOB x2. Pt mostly self-recovers balance. Pt reaching for environment occasionally to steady.  Stairs            Wheelchair Mobility    Modified Rankin (Stroke Patients Only)       Balance Overall balance assessment: Needs assistance Sitting-balance support: No upper extremity supported;Feet supported Sitting balance-Leahy Scale: Good     Standing balance support: No upper extremity supported Standing balance-Leahy Scale: Good Standing balance comment: ambulates without AD, occasional unsteadiness             High level balance activites: Direction changes;Turns;Head turns High Level Balance Comments: Turn 180* and stop, stepping over obstacle [facility] for speed and steadiness. Horizontal head turning + for LOB.             Pertinent Vitals/Pain Pain Assessment: Faces Faces Pain Scale: Hurts a little bit Pain Location: R chest, positional and pt reports possible rib fracture Pain Descriptors / Indicators: Sharp Pain Intervention(s): Monitored during session;Repositioned;Limited activity within patient's tolerance    Home Living Family/patient expects to be discharged to:: Private residence Living Arrangements: Alone Available Help at Discharge: Family Type of Home: House (townhouse) Home Access: Level entry     Home Layout: One level Home Equipment: Clinical cytogeneticist - 2 wheels;Bedside commode      Prior Function Level of Independence: Independent  Comments: pt states she has assist of neighbors if she needs it, but is typically independent.     Hand Dominance   Dominant Hand: Left     Extremity/Trunk Assessment   Upper Extremity Assessment Upper Extremity Assessment: Defer to OT evaluation    Lower Extremity Assessment Lower Extremity Assessment: Overall WFL for tasks assessed (MMT LE 5/5; functional weakness present during gait)    Cervical / Trunk Assessment Cervical / Trunk Assessment: Kyphotic;Other exceptions Cervical / Trunk Exceptions: forward head, kyphosis, and increased lordosis  Communication   Communication: No difficulties  Cognition Arousal/Alertness: Awake/alert Behavior During Therapy: WFL for tasks assessed/performed Overall Cognitive Status: Within Functional Limits for tasks assessed                                        General Comments General comments (skin integrity, edema, etc.): Pt reports multiple years of neck weakness due to history of radiation therapy.    Exercises     Assessment/Plan    PT Assessment Patient needs continued PT services  PT Problem List Decreased strength;Decreased mobility;Decreased safety awareness;Decreased activity tolerance;Decreased balance;Pain       PT Treatment Interventions DME instruction;Therapeutic activities;Gait training;Therapeutic exercise;Patient/family education;Balance training;Functional mobility training;Neuromuscular re-education    PT Goals (Current goals can be found in the Care Plan section)  Acute Rehab PT Goals PT Goal Formulation: With patient Time For Goal Achievement: 08/24/20 Potential to Achieve Goals: Good    Frequency Min 3X/week   Barriers to discharge        Co-evaluation               AM-PAC PT "6 Clicks" Mobility  Outcome Measure Help needed turning from your back to your side while in a flat bed without using bedrails?: None Help needed moving from lying on your back to sitting on the side of a flat bed without using bedrails?: None Help needed moving to and from a bed to a chair (including a wheelchair)?: None Help needed standing  up from a chair using your arms (e.g., wheelchair or bedside chair)?: None Help needed to walk in hospital room?: A Little Help needed climbing 3-5 steps with a railing? : A Little 6 Click Score: 22    End of Session Equipment Utilized During Treatment: Gait belt Activity Tolerance: Patient limited by fatigue Patient left: in chair;with call bell/phone within reach;with family/visitor present Nurse Communication: Mobility status PT Visit Diagnosis: Unsteadiness on feet (R26.81);Other abnormalities of gait and mobility (R26.89)    Time: 1700-1749 PT Time Calculation (min) (ACUTE ONLY): 20 min   Charges:   PT Evaluation $PT Eval Low Complexity: 1 Low         Ayliana Casciano E, PT Acute Rehabilitation Services Pager [phone]  Office [phone]   Cayleen Benjamin D Jerrik Housholder 08/10/2020, 10:14 AM

## 2020-08-10 NOTE — Plan of Care (Signed)

## 2020-08-11 ENCOUNTER — Inpatient Hospital Stay (HOSPITAL_COMMUNITY): Payer: Medicare Other

## 2020-08-11 DIAGNOSIS — C782 Secondary malignant neoplasm of pleura: Secondary | ICD-10-CM

## 2020-08-11 LAB — BASIC METABOLIC PANEL
Anion gap: 10 (ref 5–15)
BUN: 20 mg/dL (ref 8–23)
CO2: 25 mmol/L (ref 22–32)
Calcium: 8.7 mg/dL — ABNORMAL LOW (ref 8.9–10.3)
Chloride: 104 mmol/L (ref 98–111)
Creatinine, Ser: 0.95 mg/dL (ref 0.44–1.00)
GFR calc Af Amer: >60 mL/min (ref >60)
GFR calc non Af Amer: 59 mL/min — ABNORMAL LOW (ref >60)
Glucose, Bld: 85 mg/dL (ref 70–99)
Potassium: 4.6 mmol/L (ref 3.5–5.1)
Sodium: 139 mmol/L (ref 135–145)

## 2020-08-11 MED ORDER — CARVEDILOL 6.25 MG PO TABS: 6.2500 mg | ORAL_TABLET | Freq: Two times a day (BID) | ORAL | 0 refills | Status: AC

## 2020-08-11 MED ORDER — FUROSEMIDE 40 MG PO TABS: 40.0000 mg | ORAL_TABLET | Freq: Every day | ORAL | 0 refills | Status: AC | PRN

## 2020-08-11 MED ORDER — IOHEXOL 300 MG/ML  SOLN
75.0000 mL | Freq: Once | INTRAMUSCULAR | Status: AC | PRN
  Administered 2020-08-11: 75 mL via INTRAVENOUS

## 2020-08-11 NOTE — Progress Notes (Signed)
Patient awake upon entering. Patient taken to the restroom, ambulation tolerated well. Patient assessed and medicated. Tearful upon realizing she might have lung cancer.  Stayed with patient for comfort and allowed her to voice feelings. Patient left in bed eating breakfast. IV infusion stopped due to swelling of her left arm.

## 2020-08-11 NOTE — Plan of Care (Signed)

## 2020-08-11 NOTE — Discharge Summary (Signed)
Physician Discharge Summary  DIAMANTINA EDINGER ENI:[id number] DOB: 1946/10/30 DOA: 08/09/2020  PCP: [name], MD  Admit date: 08/09/2020 Discharge date: 08/11/2020 Consultations: Oncology Admitted From: home Disposition: home  Discharge Diagnoses:  Principal Problem:   Nausea, vomiting, and diarrhea Active Problems:   Hypothyroidism   Essential hypertension   Malignant neoplasm of female breast ([place])   AKI (acute kidney injury) [facility])   Hospital Course Summary: 74 y.o.femalewith medical history significant ofbreast cancerwith recurrence s/pchemoradiation, Hodgkin's lymphomas/pmantle cell radiation and 1988, paroxysmal atrial fibrillations/p ablation, hypertension, and hypothyroidism presented to the ED after she had a choking episode associated with nausea, vomiting and bringing up phlegm.  Patient states she has had progressive weakness since June, associated with loss of appetite and poor oral intake.  She went to PCP where she was diagnosed with uncontrolled hypothyroidism and Synthroid dosage was increased from 112 to 125 mcg daily.  She was taking this medication earlier this morning when her presenting symptoms started.  Patient reports associated diaphoresis and weakness which prompted her to lay down on the floor.  She denies any syncopal event.  She feels she swallowed the pill the wrong way.Patient also noted some right lower chest wall pain and tenderness to palpation. Her primary had ordered a chest x-ray which showed concern for increased pleural thickening and nodularity within the left lateral pneumothorax for which a CT scan was recommended given her history of breast cancer. Patient was scheduled to get this CT scan today.   ED Course: Afebrile,with blood pressures 116/53-160/71, and all other vital signs maintained. Labs significant for WBC 12.4 BUN 33, creatinine 1.75 (baseline previously 0.8 in 2019),ALP166, AST 74, ALT 82, high-sensitivity troponin 51->66,  TSH 4.634,andfree T4 1.99.COVID-19 screening was negative. Chest x-ray showed irregular pleural-based nodular opacities in the left midlung slightly increased for which CT with IV contrast was suggested to exclude metastatic disease. Patient was given 1 L of IV fluids and request for initially deferred as patient thought she may want to go home. Repeat labs showed improvement in creatinine 1.61.However, patient was noted to be unsteady on feet and admission was recommended.  Hospital course: Patient admitted to [facility] for further evaluation and management.  She was started on IV fluids for AKI.  1.  Nausea, vomiting: Likely secondary to aspiration when patient swallowed the pill wrong way.  She also had two episodes of bowel movement (not close) with no recurrence.  She feels back to her baseline.  Tolerating regular diet now.  2.  Acute kidney injury: Present on admission likely secondary to dehydration in the setting of diuretic use and poor oral intake..  Creatinine 1.75 (baseline around 0.9).  Patient states she was on Lasix as needed until a year back when it was changed to daily for history of heart failure.  She has also had symptomatic hypothyroidism with low appetite and low oral intake.    Renal function normalized with IV hydration and creatinine improved to 1.4 yesterday, 0.9 today.  Avoid nephrotoxins.    Advised to change diuretic use at home to as needed only, at least until PCP follow-up  3.  Hypothyroidism: Patient has abnormal thyroid profile (TSH up at 4.6, increased T4 at 1.9).  Synthroid dosage changed to 125 mcg recently by PCP. Will check free T3 before resuming home medications as T4 could be falsely elevated in the setting of biotin use.  Free T3 level at this time pending.  Recommended patient to contact PCP office before resuming Synthroid.  4.  Mild troponinemia: In the setting of problem #1 and problem #2.  No complaints of chest pain.  EKG unremarkable except for  prolonged QTC.  5.  Atrial fibrillation, nonsustained VT: Patient on propafenone for "37% V. tach burden" noted on Holter monitoring as outpatient.  Follows Dr. Johnsie Cancel and Dr. Curt Bears.    Discussed with Dr. Curt Bears regarding prolonged QTC on EKG (on admission 5 1 9  ms->improved to 498 ms today), he recommended to continue propafenone for now. Resume beta-blockers, oral anticoagulation.  6.  Prolonged QTC: Avoid QT prolonging agents as discussed above.  Potassium 5.9 on presentation, 4.1 on repeat labs.  Magnesium 2.5.  Repeat EKG shows some improvement. On beta-blockers.  7.  Hypertension, orthostatic hypotension: Resumed home medications including Norvasc, Coreg, hydralazine (as needed) upon admission.  Patient however reported feeling lightheaded and weak especially on walking.  She was positive and orthostatic blood pressure check with systolic in laying position in 150s, sitting in 140s and landing in 90s.  Hence, Norvasc discontinued.    Patient may resume Coreg at 6.25 mg dose which she is tolerating well here.  She is advised to monitor blood pressures at home and resume hydralazine if concern for hypertension.  Patient is a retired [employer] scientist and understands that she should check both laying and standing blood pressures before escalating medications.    8.  History of diastolic CHF: Currently appears euvolemic or dry.  Last EF preserved in 2019 with grade 2 diastolic dysfunction.  Held Lasix in concern for problem #2 during the hospital course.  May likely need to resume to use on an as-needed basis.  9.  Breast cancer/Hodgkin's lymphoma: Patient with previous history of breast cancer status post chemotherapy radiation, andHodgkin's lymphoma status post mantle cell radiation. Chest x-ray revealed concern for possible metastatic disease after finding irregular pleural-based nodular opacities in the periphery of the left lung.  She underwent CT chest with contrast today given normalization of  renal function and this does raise concerns for metastatic disease.  Discussed with Dr. Lindi Adie who recommends liver biopsy-patient wants to think about this before consenting.  She is set up for follow-up with oncology clinic on 8/25  Discharge Exam:   Vitals:   08/10/20 1850 08/10/20 2113 08/11/20 0557 08/11/20 1539  BP: 135/67  (!) 136/55 (!) 167/81  Pulse:   86 95  Resp:  18 18 16   Temp:  98.4 F (36.9 C) 98.3 F (36.8 C) 98.3 F (36.8 C)  TempSrc:  Oral Oral Oral  SpO2:  90% 91% 100%  Weight:      Height:        General: Pt is alert, awake, not in acute distress Cardiovascular: RRR, S1/S2 +, no rubs, no gallops Respiratory: CTA bilaterally, no wheezing, no rhonchi Abdominal: Soft, NT, ND, bowel sounds + Extremities: no edema, no cyanosis  Discharge Condition:Stable CODE STATUS: Full code Diet recommendation: Heart healthy Recommendations for Outpatient Follow-up:  1. Follow up with PCP: 3 days 2. Follow up with consultants: Oncology as scheduled, primary cardiologist as scheduled 3. Please obtain follow up labs including: TSH, T3, T4  Home Health services upon discharge:  Equipment/Devices upon discharge:   Discharge Instructions:  Discharge Instructions    Call MD for:  difficulty breathing, headache or visual disturbances   Complete by: As directed    Call MD for:  extreme fatigue   Complete by: As directed    Call MD for:  persistant dizziness or light-headedness   Complete by: As directed  Call MD for:  persistant nausea and vomiting   Complete by: As directed    Call MD for:  severe uncontrolled pain   Complete by: As directed    Call MD for:  temperature >100.4   Complete by: As directed    Diet - low sodium heart healthy   Complete by: As directed    Discharge instructions   Complete by: As directed    Advise support stockings during the day   Increase activity slowly   Complete by: As directed      Allergies as of 08/11/2020      Reactions    Ace Inhibitors Cough   Benazepril Cough   Epinephrine Other (See Comments)   Feels "buzzed" and heart rate increases   Potassium-containing Compounds Nausea And Vomiting   Projectile vomiting--liquid potassium    Taxotere [docetaxel] Other (See Comments)   Fluid overload      Medication List    STOP taking these medications   BIOTIN PO   calcium carbonate 600 MG Tabs tablet Commonly known as: OS-CAL   NON FORMULARY   OVER THE COUNTER MEDICATION   OVER THE COUNTER MEDICATION   UNABLE TO FIND   UNABLE TO FIND     TAKE these medications   acetaminophen 500 MG tablet Commonly known as: TYLENOL Take 500 mg by mouth every 8 (eight) hours as needed for mild pain, moderate pain or headache.   amLODipine 5 MG tablet Commonly known as: NORVASC TAKE 1 TABLET BY MOUTH EVERY DAY What changed: when to take this   atorvastatin 20 MG tablet Commonly known as: LIPITOR TAKE 1 TABLET BY MOUTH EVERY DAY AT 6PM What changed:   how much to take  how to take this  when to take this  additional instructions   b complex vitamins capsule Take 1 capsule by mouth daily after breakfast.   carvedilol 6.25 MG tablet Commonly known as: COREG Take 1 tablet (6.25 mg total) by mouth 2 (two) times daily with a meal. What changed:   medication strength  how much to take  additional instructions   docusate sodium 100 MG capsule Commonly known as: COLACE Take 100 mg by mouth 3 (three) times daily.   furosemide 40 MG tablet Commonly known as: Lasix Take 1 tablet (40 mg total) by mouth daily as needed for fluid or edema. What changed:   when to take this  reasons to take this   hydrALAZINE 25 MG tablet Commonly known as: APRESOLINE Take 1 tablet (25 mg total) by mouth 3 (three) times daily. What changed:   when to take this  reasons to take this   levothyroxine 125 MCG tablet Commonly known as: SYNTHROID Take 125 mcg by mouth daily before breakfast. What changed:  Another medication with the same name was removed. Continue taking this medication, and follow the directions you see here.   multivitamin tablet Take 1 tablet by mouth daily after breakfast. "Natural Vitamin"   propafenone 225 MG tablet Commonly known as: RYTHMOL TAKE 1 TABLET(225 MG) BY MOUTH TWICE DAILY What changed: See the new instructions.   rivaroxaban 20 MG Tabs tablet Commonly known as: Xarelto TAKE 1 TABLET BY MOUTH DAILY WITH DINNER What changed:   how much to take  how to take this  when to take this  additional instructions   Vitamin D3 50 MCG (2000 UT) Tabs Take 2,000 Units by mouth daily. What changed: Another medication with the same name was removed. Continue taking this  medication, and follow the directions you see here.       Allergies  Allergen Reactions  . Ace Inhibitors Cough  . Benazepril Cough  . Epinephrine Other (See Comments)    Feels "buzzed" and heart rate increases  . Potassium-Containing Compounds Nausea And Vomiting    Projectile vomiting--liquid potassium   . Taxotere [Docetaxel] Other (See Comments)    Fluid overload      The results of significant diagnostics from this hospitalization (including imaging, microbiology, ancillary and laboratory) are listed below for reference.    Labs: BNP (last 3 results) No results for input(s): BNP in the last 8760 hours. Basic Metabolic Panel: Recent Labs  Lab 08/09/20 0938 08/09/20 1339 08/10/20 0325 08/11/20 0419  NA 136 137 140 139  K 4.6 5.9* 4.1 4.6  CL 98 98 103 104  CO2 25 25 25 25   GLUCOSE 123* 96 98 85  BUN 33* 34* 30* 20  CREATININE 1.75* 1.61* 1.40* 0.95  CALCIUM 9.9 9.2 8.8* 8.7*  MG  --   --  2.5*  --    Liver Function Tests: Recent Labs  Lab 08/09/20 0938  AST 74*  ALT 82*  ALKPHOS 166*  BILITOT 1.1  PROT 6.9  ALBUMIN 3.8   Recent Labs  Lab 08/09/20 0938  LIPASE 23   No results for input(s): AMMONIA in the last 168 hours. CBC: Recent Labs  Lab  08/09/20 0938 08/10/20 0325  WBC 12.4* 11.7*  HGB 15.0 12.6  HCT 46.7* 38.8  MCV 92.8 91.7  PLT 399 357   Cardiac Enzymes: No results for input(s): CKTOTAL, CKMB, CKMBINDEX, TROPONINI in the last 168 hours. BNP: Invalid input(s): POCBNP CBG: No results for input(s): GLUCAP in the last 168 hours. D-Dimer No results for input(s): DDIMER in the last 72 hours. Hgb A1c No results for input(s): HGBA1C in the last 72 hours. Lipid Profile No results for input(s): CHOL, HDL, LDLCALC, TRIG, CHOLHDL, LDLDIRECT in the last 72 hours. Thyroid function studies Recent Labs    08/09/20 1208  TSH 4.634*   Anemia work up No results for input(s): VITAMINB12, FOLATE, FERRITIN, TIBC, IRON, RETICCTPCT in the last 72 hours. Urinalysis    Component Value Date/Time   COLORURINE YELLOW 08/09/2020 1400   APPEARANCEUR HAZY (A) 08/09/2020 1400   LABSPEC 1.012 08/09/2020 1400   PHURINE 7.0 08/09/2020 1400   GLUCOSEU 50 (A) 08/09/2020 1400   HGBUR NEGATIVE 08/09/2020 1400   BILIRUBINUR NEGATIVE 08/09/2020 1400   KETONESUR 5 (A) 08/09/2020 1400   PROTEINUR 100 (A) 08/09/2020 1400   UROBILINOGEN 0.2 05/10/2013 0438   NITRITE NEGATIVE 08/09/2020 1400   LEUKOCYTESUR NEGATIVE 08/09/2020 1400   Sepsis Labs Invalid input(s): PROCALCITONIN,  WBC,  LACTICIDVEN Microbiology Recent Results (from the past 240 hour(s))  SARS Coronavirus 2 by RT PCR (hospital order, performed in [place] hospital lab) Nasopharyngeal Nasopharyngeal Swab     Status: None   Collection Time: 08/09/20 12:08 PM   Specimen: Nasopharyngeal Swab  Result Value Ref Range Status   SARS Coronavirus 2 NEGATIVE NEGATIVE Final    Comment: (NOTE) SARS-CoV-2 target nucleic acids are NOT DETECTED.  The SARS-CoV-2 RNA is generally detectable in upper and lower respiratory specimens during the acute phase of infection. The lowest concentration of SARS-CoV-2 viral copies this assay can detect is 250 copies / mL. A negative result does  not preclude SARS-CoV-2 infection and should not be used as the sole basis for treatment or other patient management decisions.  A negative result  may occur with improper specimen collection / handling, submission of specimen other than nasopharyngeal swab, presence of viral mutation(s) within the areas targeted by this assay, and inadequate number of viral copies (<250 copies / mL). A negative result must be combined with clinical observations, patient history, and epidemiological information.  Fact Sheet for Patients:   StrictlyIdeas.no  Fact Sheet for Healthcare Providers: BankingDealers.co.za  This test is not yet approved or  cleared by the [place] FDA and has been authorized for detection and/or diagnosis of SARS-CoV-2 by FDA under an Emergency Use Authorization (EUA).  This EUA will remain in effect (meaning this test can be used) for the duration of the COVID-19 declaration under Section 564(b)(1) of the Act, 21 U.S.C. section 360bbb-3(b)(1), unless the authorization is terminated or revoked sooner.  Performed at [facility], [place] [address] [place], [place] [address]     Procedures/Studies: DG Ribs Unilateral W/Chest Right  Result Date: 07/31/2020 CLINICAL DATA:  Right anterior chest wall pain for 1 week, osteoporosis, history of breast cancer EXAM: RIGHT RIBS AND CHEST - 3+ VIEW COMPARISON:  10/11/2018 FINDINGS: Frontal and oblique views of the right thoracic cage are obtained. There is increased left pleural thickening and/or pleural effusion. In addition, nodularity is seen along the left lateral mid hemithorax, new since prior exam. CT chest with IV contrast is recommended for further evaluation. The right chest is clear.  No acute or destructive bony lesions. IMPRESSION: 1. No acute bony abnormality. 2. Increased pleural thickening and nodularity within the left lateral hemithorax and left costophrenic angle.  While this could reflect increased scarring or pleural fluid, CT chest with IV contrast is recommended in this patient with a history of prior breast cancer. These results will be called to the ordering clinician or representative by the Radiologist Assistant, and communication documented in the PACS or [employer]. Electronically Signed   By: Randa Ngo M.D.   On: 07/31/2020 08:41   CT CHEST W CONTRAST  Result Date: 08/11/2020 CLINICAL DATA:  Abnormal chest radiograph, evaluate for metastatic disease, history of breast cancer EXAM: CT CHEST WITH CONTRAST TECHNIQUE: Multidetector CT imaging of the chest was performed during intravenous contrast administration. CONTRAST:  28mL OMNIPAQUE IOHEXOL 300 MG/ML  SOLN COMPARISON:  Chest radiographs, 08/09/2020, CT chest, 08/02/2018 FINDINGS: Cardiovascular: Aortic atherosclerosis. Normal heart size. Three-vessel coronary artery calcifications. No pericardial effusion. Mediastinum/Nodes: Prominent pretracheal lymph nodes, which are not significantly changed compared to prior examination dated 2019. Thyroid gland, trachea, and esophagus demonstrate no significant findings. Lungs/Pleura: Extensive pleural and fissural nodularity, left greater than right, with moderate left, and small right pleural effusions and associated atelectasis or consolidation. Upper Abdomen: Numerous large hypodense masses of the liver, incompletely imaged. Musculoskeletal: Status post right mastectomy with implant reconstruction. Very heterogeneous, osteopenic appearance of the included skeleton. IMPRESSION: 1. Extensive pleural and fissural nodularity, left greater than right, with moderate left, and small right pleural effusions and associated atelectasis or consolidation. Findings are consistent with pleural metastatic disease. 2. Numerous large hypodense masses of the liver, incompletely imaged, consistent with metastatic disease. 3. Very heterogeneous, osteopenic appearance of the  included skeleton, suspicious for osseous metastatic disease, although distinct lesions are not visualized. 4. Status post right mastectomy with implant reconstruction. 5. Coronary artery disease.  Aortic Atherosclerosis (ICD10-I70.0). Electronically Signed   By: Eddie Candle M.D.   On: 08/11/2020 11:30   DG Chest Portable 1 View  Result Date: 08/09/2020 CLINICAL DATA:  Nausea, vomiting, dizziness, history of breast cancer  EXAM: PORTABLE CHEST 1 VIEW COMPARISON:  07/30/2020 chest radiograph. FINDINGS: Surgical clips overlie the low left neck. Stable cardiomediastinal silhouette with normal heart size. No pneumothorax. Chronic mild blunting of the left costophrenic angles bilaterally, improved on the left. No pulmonary edema. Irregular pleural-based nodular opacities in the peripheral left mid lung are slightly increased. IMPRESSION: 1. Irregular pleural-based nodular opacities in the peripheral left mid lung, slightly increased. Suggest chest CT with IV contrast for further evaluation to exclude metastatic disease in this patient with a history of breast cancer. 2. Chronic mild blunting of the left costophrenic angles, improved on the left, cannot exclude small pleural effusions. Electronically Signed   By: Ilona Sorrel M.D.   On: 08/09/2020 12:01     Time coordinating discharge: Over 30 minutes  SIGNED:   Guilford Shi, MD  [facility] 08/11/2020, 4:33 PM

## 2020-08-11 NOTE — Progress Notes (Signed)
Brief oncology note:  We were notified about the patient's admission and CT scan results.  I discussed the CT scan with the patient today.  She is planning to discharge to home later today.  We discussed proceeding with a liver biopsy as an outpatient.  The patient states that she does not wish to pursue a biopsy.  She is agreeable to an office visit with Dr. Lindi Adie to discuss the CT scan results and need for biopsy in the presence of her family.  The patient was advised to come to the cancer center on Wednesday, 08/12/2020 at 8 AM to see Dr. Lindi Adie and to discuss the CT scan results and further recommendations.  Mikey Bussing, DNP, AGPCNP-BC, AOCNP Mon/Tues/Thurs/Fri 7am-5pm; Off Wednesdays Cell: [phone]

## 2020-08-11 NOTE — Progress Notes (Signed)
Patient discharged to home with instructions. 

## 2020-08-12 ENCOUNTER — Telehealth: Payer: Self-pay | Admitting: *Deleted

## 2020-08-12 ENCOUNTER — Inpatient Hospital Stay: Payer: Medicare Other | Attending: Hematology and Oncology | Admitting: Hematology and Oncology

## 2020-08-12 ENCOUNTER — Other Ambulatory Visit: Payer: Self-pay

## 2020-08-12 DIAGNOSIS — I5032 Chronic diastolic (congestive) heart failure: Secondary | ICD-10-CM | POA: Diagnosis not present

## 2020-08-12 DIAGNOSIS — I110 Hypertensive heart disease with heart failure: Secondary | ICD-10-CM | POA: Diagnosis not present

## 2020-08-12 DIAGNOSIS — I447 Left bundle-branch block, unspecified: Secondary | ICD-10-CM | POA: Diagnosis not present

## 2020-08-12 DIAGNOSIS — I480 Paroxysmal atrial fibrillation: Secondary | ICD-10-CM | POA: Diagnosis not present

## 2020-08-12 DIAGNOSIS — J90 Pleural effusion, not elsewhere classified: Secondary | ICD-10-CM | POA: Diagnosis not present

## 2020-08-12 DIAGNOSIS — Z8571 Personal history of Hodgkin lymphoma: Secondary | ICD-10-CM | POA: Insufficient documentation

## 2020-08-12 DIAGNOSIS — C7800 Secondary malignant neoplasm of unspecified lung: Secondary | ICD-10-CM | POA: Diagnosis not present

## 2020-08-12 DIAGNOSIS — I6522 Occlusion and stenosis of left carotid artery: Secondary | ICD-10-CM

## 2020-08-12 DIAGNOSIS — C7931 Secondary malignant neoplasm of brain: Secondary | ICD-10-CM | POA: Diagnosis not present

## 2020-08-12 DIAGNOSIS — C50919 Malignant neoplasm of unspecified site of unspecified female breast: Secondary | ICD-10-CM | POA: Insufficient documentation

## 2020-08-12 DIAGNOSIS — Z853 Personal history of malignant neoplasm of breast: Secondary | ICD-10-CM | POA: Diagnosis not present

## 2020-08-12 DIAGNOSIS — E039 Hypothyroidism, unspecified: Secondary | ICD-10-CM | POA: Diagnosis not present

## 2020-08-12 DIAGNOSIS — C787 Secondary malignant neoplasm of liver and intrahepatic bile duct: Secondary | ICD-10-CM | POA: Diagnosis not present

## 2020-08-12 DIAGNOSIS — Z8701 Personal history of pneumonia (recurrent): Secondary | ICD-10-CM | POA: Diagnosis not present

## 2020-08-12 DIAGNOSIS — I428 Other cardiomyopathies: Secondary | ICD-10-CM | POA: Diagnosis not present

## 2020-08-12 DIAGNOSIS — Z9011 Acquired absence of right breast and nipple: Secondary | ICD-10-CM | POA: Diagnosis not present

## 2020-08-12 LAB — T3, FREE: T3, Free: 2 pg/mL (ref 2.0–4.4)

## 2020-08-12 NOTE — Progress Notes (Signed)
Patient Care Team: [name], MD as PCP - General Curt Bears, Ocie Doyne, MD as PCP - Electrophysiology (Cardiology) Josue Hector, MD as PCP - Cardiology (Cardiology)  DIAGNOSIS:  Encounter Diagnosis  Name Primary?  . Metastatic breast cancer ([place])     SUMMARY OF ONCOLOGIC HISTORY: Oncology History  Metastatic breast cancer ([place])  2009 Initial Diagnosis   breast cancer in 2009 underwent right breast mastectomy with implant reconstruction for a T1 B. N0 M0 stage IA triple negative breast cancer.   2013 Relapse/Recurrence   chest wall recurrence that was resected   08/12/2020 Relapse/Recurrence   Ext pleural nodularity mod left pleural effusion. Numerous larger hypodense masses liver, ? Bone mets     CHIEF COMPLIANT: Follow-up after recent hospitalization  INTERVAL HISTORY: [name] is a 74 year old with above-mentioned history of triple negative recurrent right breast cancer who presented to the hospital with fatigue, nausea and vomiting.  Previously she was feeling weak and short of breath and a chest x-ray showed abnormality in the left lung.  In the hospital she underwent a CT of the chest which showed extensive pleural thickening and widespread liver metastases.  There was also question of bone metastases.  She was discharged home and is here today accompanied by her son to discuss prognosis.  She is in a wheelchair and looks frail.  She is short of breath when she talks for long sentences.  She has intermittent right upper abdominal discomfort.   ALLERGIES:  is allergic to ace inhibitors, benazepril, epinephrine, potassium-containing compounds, and taxotere [docetaxel].  MEDICATIONS:  Current Outpatient Medications  Medication Sig Dispense Refill  . acetaminophen (TYLENOL) 500 MG tablet Take 500 mg by mouth every 8 (eight) hours as needed for mild pain, moderate pain or headache.    Marland Kitchen amLODipine (NORVASC) 5 MG tablet TAKE 1 TABLET BY MOUTH EVERY DAY (Patient  taking differently: Take 5 mg by mouth in the morning. ) 90 tablet 3  . atorvastatin (LIPITOR) 20 MG tablet TAKE 1 TABLET BY MOUTH EVERY DAY AT 6PM (Patient taking differently: Take 20 mg by mouth daily at 6 PM. ) 90 tablet 3  . b complex vitamins capsule Take 1 capsule by mouth daily after breakfast. (Patient not taking: Reported on 08/09/2020)    . carvedilol (COREG) 6.25 MG tablet Take 1 tablet (6.25 mg total) by mouth 2 (two) times daily with a meal. 60 tablet 0  . Cholecalciferol (VITAMIN D3) 50 MCG (2000 UT) TABS Take 2,000 Units by mouth daily.    Marland Kitchen docusate sodium (COLACE) 100 MG capsule Take 100 mg by mouth 3 (three) times daily. (Patient not taking: Reported on 08/09/2020)    . furosemide (LASIX) 40 MG tablet Take 1 tablet (40 mg total) by mouth daily as needed for fluid or edema. 30 tablet 0  . hydrALAZINE (APRESOLINE) 25 MG tablet Take 1 tablet (25 mg total) by mouth 3 (three) times daily. (Patient taking differently: Take 25 mg by mouth 3 (three) times daily as needed (if Systolic number is 062 or greater). ) 90 tablet 10  . levothyroxine (SYNTHROID) 125 MCG tablet Take 125 mcg by mouth daily before breakfast.    . Multiple Vitamin (MULTIVITAMIN) tablet Take 1 tablet by mouth daily after breakfast. "Natural Vitamin"    . propafenone (RYTHMOL) 225 MG tablet TAKE 1 TABLET(225 MG) BY MOUTH TWICE DAILY (Patient taking differently: Take 225 mg by mouth in the morning and at bedtime. ) 180 tablet 2  . rivaroxaban (XARELTO) 20  MG TABS tablet TAKE 1 TABLET BY MOUTH DAILY WITH DINNER (Patient taking differently: Take 20 mg by mouth daily with supper. ) 90 tablet 2   No current facility-administered medications for this visit.    PHYSICAL EXAMINATION: ECOG PERFORMANCE STATUS: 3 - Symptomatic, >50% confined to bed  Vitals:   08/12/20 0818  BP: (!) 152/75  Pulse: (!) 45  Resp: 17  Temp: 97.8 F (36.6 C)  SpO2: 97%   Filed Weights   08/12/20 0818  Weight: 133 lb 6.4 oz (60.5 kg)        LABORATORY DATA:  I have reviewed the data as listed CMP Latest Ref Rng & Units 08/11/2020 08/10/2020 08/09/2020  Glucose 70 - 99 mg/dL 85 98 96  BUN 8 - 23 mg/dL 20 30(H) 34(H)  Creatinine 0.44 - 1.00 mg/dL 0.95 1.40(H) 1.61(H)  Sodium 135 - 145 mmol/L 139 140 137  Potassium 3.5 - 5.1 mmol/L 4.6 4.1 5.9(H)  Chloride 98 - 111 mmol/L 104 103 98  CO2 22 - 32 mmol/L 25 25 25   Calcium 8.9 - 10.3 mg/dL 8.7(L) 8.8(L) 9.2  Total Protein 6.5 - 8.1 g/dL - - -  Total Bilirubin 0.3 - 1.2 mg/dL - - -  Alkaline Phos 38 - 126 U/L - - -  AST 15 - 41 U/L - - -  ALT 0 - 44 U/L - - -    Lab Results  Component Value Date   WBC 11.7 (H) 08/10/2020   HGB 12.6 08/10/2020   HCT 38.8 08/10/2020   MCV 91.7 08/10/2020   PLT 357 08/10/2020   NEUTROABS 6.9 10/02/2018    ASSESSMENT & PLAN:  Metastatic breast cancer ([place]) 2009: H/O breast cancer in 2009 underwent right breast mastectomy with implant reconstruction for a T1 B. N0 M0 stage IA triple negative breast cancer. (H/O Hodgkins Lymphoma S/P mantle XRT) 2013: Chest wall recurrence resected 08/12/2020: Hospitalization for Nausea, vom diarrhea, CT chest: Ext pleural nodularity mod left pleural effusion. Numerous larger hypodense masses liver, ? Bone mets  Counseling: Based on previous results, most likely this is metastatic triple neg breast cancer. Discussed treatment options: chemotherapy and immunotherapy were discussed. Patient was very clear in her wishes to not pursue any further systemic treatments. She and her family are on the same page and have expressed their intention to be treated with hospice and palliative care.  I will consult hospice care to meet with her and her family today.  She would like to be at an inpatient hospice facility so as not to be a stress on her family. She is very familiar with hospice because they took care of her husband previously. [facility]  No orders of the defined types were placed in this encounter.  The  patient has a good understanding of the overall plan. she agrees with it. she will call with any problems that may develop before the next visit here. Total time spent: 30 mins including face to face time and time spent for planning, charting and co-ordination of care   Harriette Ohara, MD 08/12/20

## 2020-08-12 NOTE — Assessment & Plan Note (Signed)
2009: H/O breast cancer in 2009 underwent right breast mastectomy with implant reconstruction for a T1 B. N0 M0 stage IA triple negative breast cancer. (H/O Hodgkins Lymphoma S/P mantle XRT) 2013: Chest wall recurrence resected 08/12/2020: Hospitalization for Nausea, vom diarrhea, CT chest: Ext pleural nodularity mod left pleural effusion. Numerous larger hypodense masses liver, ? Bone mets  Counseling: Based on previous results, most likely this is metastatic triple neg breast cancer. Discussed treatment options: chemotherapy and immunotherapy were discussed.

## 2020-08-12 NOTE — Addendum Note (Signed)
Addended by: Sharlynn Oliphant A on: 08/12/2020 01:18 PM   Modules accepted: Orders

## 2020-08-12 NOTE — Telephone Encounter (Signed)
Hospice referral called to [facility]

## 2020-08-13 ENCOUNTER — Telehealth: Payer: Self-pay | Admitting: Hematology and Oncology

## 2020-08-13 DIAGNOSIS — Z9011 Acquired absence of right breast and nipple: Secondary | ICD-10-CM | POA: Diagnosis not present

## 2020-08-13 DIAGNOSIS — C787 Secondary malignant neoplasm of liver and intrahepatic bile duct: Secondary | ICD-10-CM | POA: Diagnosis not present

## 2020-08-13 DIAGNOSIS — Z853 Personal history of malignant neoplasm of breast: Secondary | ICD-10-CM | POA: Diagnosis not present

## 2020-08-13 DIAGNOSIS — J90 Pleural effusion, not elsewhere classified: Secondary | ICD-10-CM | POA: Diagnosis not present

## 2020-08-13 DIAGNOSIS — C7931 Secondary malignant neoplasm of brain: Secondary | ICD-10-CM | POA: Diagnosis not present

## 2020-08-13 DIAGNOSIS — C7800 Secondary malignant neoplasm of unspecified lung: Secondary | ICD-10-CM | POA: Diagnosis not present

## 2020-08-13 NOTE — Telephone Encounter (Signed)
No 8/25 los, no changes made to pt schedule

## 2020-08-14 DIAGNOSIS — C787 Secondary malignant neoplasm of liver and intrahepatic bile duct: Secondary | ICD-10-CM | POA: Diagnosis not present

## 2020-08-14 DIAGNOSIS — Z9011 Acquired absence of right breast and nipple: Secondary | ICD-10-CM | POA: Diagnosis not present

## 2020-08-14 DIAGNOSIS — Z853 Personal history of malignant neoplasm of breast: Secondary | ICD-10-CM | POA: Diagnosis not present

## 2020-08-14 DIAGNOSIS — C7800 Secondary malignant neoplasm of unspecified lung: Secondary | ICD-10-CM | POA: Diagnosis not present

## 2020-08-14 DIAGNOSIS — J90 Pleural effusion, not elsewhere classified: Secondary | ICD-10-CM | POA: Diagnosis not present

## 2020-08-14 DIAGNOSIS — C7931 Secondary malignant neoplasm of brain: Secondary | ICD-10-CM | POA: Diagnosis not present

## 2020-08-17 DIAGNOSIS — C7800 Secondary malignant neoplasm of unspecified lung: Secondary | ICD-10-CM | POA: Diagnosis not present

## 2020-08-17 DIAGNOSIS — Z853 Personal history of malignant neoplasm of breast: Secondary | ICD-10-CM | POA: Diagnosis not present

## 2020-08-17 DIAGNOSIS — C787 Secondary malignant neoplasm of liver and intrahepatic bile duct: Secondary | ICD-10-CM | POA: Diagnosis not present

## 2020-08-17 DIAGNOSIS — C7931 Secondary malignant neoplasm of brain: Secondary | ICD-10-CM | POA: Diagnosis not present

## 2020-08-17 DIAGNOSIS — J90 Pleural effusion, not elsewhere classified: Secondary | ICD-10-CM | POA: Diagnosis not present

## 2020-08-17 DIAGNOSIS — Z9011 Acquired absence of right breast and nipple: Secondary | ICD-10-CM | POA: Diagnosis not present

## 2020-08-19 DIAGNOSIS — I480 Paroxysmal atrial fibrillation: Secondary | ICD-10-CM | POA: Diagnosis not present

## 2020-08-19 DIAGNOSIS — Z8701 Personal history of pneumonia (recurrent): Secondary | ICD-10-CM | POA: Diagnosis not present

## 2020-08-19 DIAGNOSIS — Z8571 Personal history of Hodgkin lymphoma: Secondary | ICD-10-CM | POA: Diagnosis not present

## 2020-08-19 DIAGNOSIS — Z853 Personal history of malignant neoplasm of breast: Secondary | ICD-10-CM | POA: Diagnosis not present

## 2020-08-19 DIAGNOSIS — C787 Secondary malignant neoplasm of liver and intrahepatic bile duct: Secondary | ICD-10-CM | POA: Diagnosis not present

## 2020-08-19 DIAGNOSIS — E039 Hypothyroidism, unspecified: Secondary | ICD-10-CM | POA: Diagnosis not present

## 2020-08-19 DIAGNOSIS — C7800 Secondary malignant neoplasm of unspecified lung: Secondary | ICD-10-CM | POA: Diagnosis not present

## 2020-08-19 DIAGNOSIS — Z9011 Acquired absence of right breast and nipple: Secondary | ICD-10-CM | POA: Diagnosis not present

## 2020-08-19 DIAGNOSIS — I110 Hypertensive heart disease with heart failure: Secondary | ICD-10-CM | POA: Diagnosis not present

## 2020-08-19 DIAGNOSIS — I447 Left bundle-branch block, unspecified: Secondary | ICD-10-CM | POA: Diagnosis not present

## 2020-08-19 DIAGNOSIS — I428 Other cardiomyopathies: Secondary | ICD-10-CM | POA: Diagnosis not present

## 2020-08-19 DIAGNOSIS — C7931 Secondary malignant neoplasm of brain: Secondary | ICD-10-CM | POA: Diagnosis not present

## 2020-08-19 DIAGNOSIS — I5032 Chronic diastolic (congestive) heart failure: Secondary | ICD-10-CM | POA: Diagnosis not present

## 2020-08-19 DIAGNOSIS — J90 Pleural effusion, not elsewhere classified: Secondary | ICD-10-CM | POA: Diagnosis not present

## 2020-08-20 ENCOUNTER — Other Ambulatory Visit: Payer: Medicare Other

## 2020-08-21 DIAGNOSIS — C7931 Secondary malignant neoplasm of brain: Secondary | ICD-10-CM | POA: Diagnosis not present

## 2020-08-21 DIAGNOSIS — Z853 Personal history of malignant neoplasm of breast: Secondary | ICD-10-CM | POA: Diagnosis not present

## 2020-08-21 DIAGNOSIS — Z9011 Acquired absence of right breast and nipple: Secondary | ICD-10-CM | POA: Diagnosis not present

## 2020-08-21 DIAGNOSIS — J90 Pleural effusion, not elsewhere classified: Secondary | ICD-10-CM | POA: Diagnosis not present

## 2020-08-21 DIAGNOSIS — C7800 Secondary malignant neoplasm of unspecified lung: Secondary | ICD-10-CM | POA: Diagnosis not present

## 2020-08-21 DIAGNOSIS — C787 Secondary malignant neoplasm of liver and intrahepatic bile duct: Secondary | ICD-10-CM | POA: Diagnosis not present

## 2020-08-26 DIAGNOSIS — C7931 Secondary malignant neoplasm of brain: Secondary | ICD-10-CM | POA: Diagnosis not present

## 2020-08-26 DIAGNOSIS — C787 Secondary malignant neoplasm of liver and intrahepatic bile duct: Secondary | ICD-10-CM | POA: Diagnosis not present

## 2020-08-26 DIAGNOSIS — Z9011 Acquired absence of right breast and nipple: Secondary | ICD-10-CM | POA: Diagnosis not present

## 2020-08-26 DIAGNOSIS — J90 Pleural effusion, not elsewhere classified: Secondary | ICD-10-CM | POA: Diagnosis not present

## 2020-08-26 DIAGNOSIS — Z853 Personal history of malignant neoplasm of breast: Secondary | ICD-10-CM | POA: Diagnosis not present

## 2020-08-26 DIAGNOSIS — C7800 Secondary malignant neoplasm of unspecified lung: Secondary | ICD-10-CM | POA: Diagnosis not present

## 2020-08-28 DIAGNOSIS — C787 Secondary malignant neoplasm of liver and intrahepatic bile duct: Secondary | ICD-10-CM | POA: Diagnosis not present

## 2020-08-28 DIAGNOSIS — Z853 Personal history of malignant neoplasm of breast: Secondary | ICD-10-CM | POA: Diagnosis not present

## 2020-08-28 DIAGNOSIS — J90 Pleural effusion, not elsewhere classified: Secondary | ICD-10-CM | POA: Diagnosis not present

## 2020-08-28 DIAGNOSIS — C7931 Secondary malignant neoplasm of brain: Secondary | ICD-10-CM | POA: Diagnosis not present

## 2020-08-28 DIAGNOSIS — Z9011 Acquired absence of right breast and nipple: Secondary | ICD-10-CM | POA: Diagnosis not present

## 2020-08-28 DIAGNOSIS — C7800 Secondary malignant neoplasm of unspecified lung: Secondary | ICD-10-CM | POA: Diagnosis not present

## 2020-08-31 DIAGNOSIS — C787 Secondary malignant neoplasm of liver and intrahepatic bile duct: Secondary | ICD-10-CM | POA: Diagnosis not present

## 2020-08-31 DIAGNOSIS — Z9011 Acquired absence of right breast and nipple: Secondary | ICD-10-CM | POA: Diagnosis not present

## 2020-08-31 DIAGNOSIS — Z853 Personal history of malignant neoplasm of breast: Secondary | ICD-10-CM | POA: Diagnosis not present

## 2020-08-31 DIAGNOSIS — J90 Pleural effusion, not elsewhere classified: Secondary | ICD-10-CM | POA: Diagnosis not present

## 2020-08-31 DIAGNOSIS — C7800 Secondary malignant neoplasm of unspecified lung: Secondary | ICD-10-CM | POA: Diagnosis not present

## 2020-08-31 DIAGNOSIS — C7931 Secondary malignant neoplasm of brain: Secondary | ICD-10-CM | POA: Diagnosis not present

## 2020-09-01 DIAGNOSIS — Z853 Personal history of malignant neoplasm of breast: Secondary | ICD-10-CM | POA: Diagnosis not present

## 2020-09-01 DIAGNOSIS — J90 Pleural effusion, not elsewhere classified: Secondary | ICD-10-CM | POA: Diagnosis not present

## 2020-09-01 DIAGNOSIS — C787 Secondary malignant neoplasm of liver and intrahepatic bile duct: Secondary | ICD-10-CM | POA: Diagnosis not present

## 2020-09-01 DIAGNOSIS — C7931 Secondary malignant neoplasm of brain: Secondary | ICD-10-CM | POA: Diagnosis not present

## 2020-09-01 DIAGNOSIS — C7800 Secondary malignant neoplasm of unspecified lung: Secondary | ICD-10-CM | POA: Diagnosis not present

## 2020-09-01 DIAGNOSIS — Z9011 Acquired absence of right breast and nipple: Secondary | ICD-10-CM | POA: Diagnosis not present

## 2020-09-02 DIAGNOSIS — C7931 Secondary malignant neoplasm of brain: Secondary | ICD-10-CM | POA: Diagnosis not present

## 2020-09-02 DIAGNOSIS — C787 Secondary malignant neoplasm of liver and intrahepatic bile duct: Secondary | ICD-10-CM | POA: Diagnosis not present

## 2020-09-02 DIAGNOSIS — C7800 Secondary malignant neoplasm of unspecified lung: Secondary | ICD-10-CM | POA: Diagnosis not present

## 2020-09-02 DIAGNOSIS — J90 Pleural effusion, not elsewhere classified: Secondary | ICD-10-CM | POA: Diagnosis not present

## 2020-09-02 DIAGNOSIS — Z853 Personal history of malignant neoplasm of breast: Secondary | ICD-10-CM | POA: Diagnosis not present

## 2020-09-02 DIAGNOSIS — Z9011 Acquired absence of right breast and nipple: Secondary | ICD-10-CM | POA: Diagnosis not present

## 2020-09-04 DIAGNOSIS — Z853 Personal history of malignant neoplasm of breast: Secondary | ICD-10-CM | POA: Diagnosis not present

## 2020-09-04 DIAGNOSIS — J90 Pleural effusion, not elsewhere classified: Secondary | ICD-10-CM | POA: Diagnosis not present

## 2020-09-04 DIAGNOSIS — C7800 Secondary malignant neoplasm of unspecified lung: Secondary | ICD-10-CM | POA: Diagnosis not present

## 2020-09-04 DIAGNOSIS — C7931 Secondary malignant neoplasm of brain: Secondary | ICD-10-CM | POA: Diagnosis not present

## 2020-09-04 DIAGNOSIS — Z9011 Acquired absence of right breast and nipple: Secondary | ICD-10-CM | POA: Diagnosis not present

## 2020-09-04 DIAGNOSIS — C787 Secondary malignant neoplasm of liver and intrahepatic bile duct: Secondary | ICD-10-CM | POA: Diagnosis not present

## 2020-09-05 DIAGNOSIS — C7800 Secondary malignant neoplasm of unspecified lung: Secondary | ICD-10-CM | POA: Diagnosis not present

## 2020-09-05 DIAGNOSIS — Z9011 Acquired absence of right breast and nipple: Secondary | ICD-10-CM | POA: Diagnosis not present

## 2020-09-05 DIAGNOSIS — C7931 Secondary malignant neoplasm of brain: Secondary | ICD-10-CM | POA: Diagnosis not present

## 2020-09-05 DIAGNOSIS — C787 Secondary malignant neoplasm of liver and intrahepatic bile duct: Secondary | ICD-10-CM | POA: Diagnosis not present

## 2020-09-05 DIAGNOSIS — Z853 Personal history of malignant neoplasm of breast: Secondary | ICD-10-CM | POA: Diagnosis not present

## 2020-09-05 DIAGNOSIS — J90 Pleural effusion, not elsewhere classified: Secondary | ICD-10-CM | POA: Diagnosis not present

## 2020-09-07 DIAGNOSIS — J90 Pleural effusion, not elsewhere classified: Secondary | ICD-10-CM | POA: Diagnosis not present

## 2020-09-07 DIAGNOSIS — C787 Secondary malignant neoplasm of liver and intrahepatic bile duct: Secondary | ICD-10-CM | POA: Diagnosis not present

## 2020-09-07 DIAGNOSIS — C7800 Secondary malignant neoplasm of unspecified lung: Secondary | ICD-10-CM | POA: Diagnosis not present

## 2020-09-07 DIAGNOSIS — Z9011 Acquired absence of right breast and nipple: Secondary | ICD-10-CM | POA: Diagnosis not present

## 2020-09-07 DIAGNOSIS — Z853 Personal history of malignant neoplasm of breast: Secondary | ICD-10-CM | POA: Diagnosis not present

## 2020-09-07 DIAGNOSIS — C7931 Secondary malignant neoplasm of brain: Secondary | ICD-10-CM | POA: Diagnosis not present

## 2020-09-10 DIAGNOSIS — Z853 Personal history of malignant neoplasm of breast: Secondary | ICD-10-CM | POA: Diagnosis not present

## 2020-09-10 DIAGNOSIS — Z9011 Acquired absence of right breast and nipple: Secondary | ICD-10-CM | POA: Diagnosis not present

## 2020-09-10 DIAGNOSIS — C7800 Secondary malignant neoplasm of unspecified lung: Secondary | ICD-10-CM | POA: Diagnosis not present

## 2020-09-10 DIAGNOSIS — C787 Secondary malignant neoplasm of liver and intrahepatic bile duct: Secondary | ICD-10-CM | POA: Diagnosis not present

## 2020-09-10 DIAGNOSIS — C7931 Secondary malignant neoplasm of brain: Secondary | ICD-10-CM | POA: Diagnosis not present

## 2020-09-10 DIAGNOSIS — J90 Pleural effusion, not elsewhere classified: Secondary | ICD-10-CM | POA: Diagnosis not present

## 2020-09-11 DIAGNOSIS — C787 Secondary malignant neoplasm of liver and intrahepatic bile duct: Secondary | ICD-10-CM | POA: Diagnosis not present

## 2020-09-11 DIAGNOSIS — C7931 Secondary malignant neoplasm of brain: Secondary | ICD-10-CM | POA: Diagnosis not present

## 2020-09-11 DIAGNOSIS — Z9011 Acquired absence of right breast and nipple: Secondary | ICD-10-CM | POA: Diagnosis not present

## 2020-09-11 DIAGNOSIS — Z853 Personal history of malignant neoplasm of breast: Secondary | ICD-10-CM | POA: Diagnosis not present

## 2020-09-11 DIAGNOSIS — C7800 Secondary malignant neoplasm of unspecified lung: Secondary | ICD-10-CM | POA: Diagnosis not present

## 2020-09-11 DIAGNOSIS — J90 Pleural effusion, not elsewhere classified: Secondary | ICD-10-CM | POA: Diagnosis not present

## 2020-09-14 DIAGNOSIS — Z853 Personal history of malignant neoplasm of breast: Secondary | ICD-10-CM | POA: Diagnosis not present

## 2020-09-14 DIAGNOSIS — C7931 Secondary malignant neoplasm of brain: Secondary | ICD-10-CM | POA: Diagnosis not present

## 2020-09-14 DIAGNOSIS — J90 Pleural effusion, not elsewhere classified: Secondary | ICD-10-CM | POA: Diagnosis not present

## 2020-09-14 DIAGNOSIS — Z9011 Acquired absence of right breast and nipple: Secondary | ICD-10-CM | POA: Diagnosis not present

## 2020-09-14 DIAGNOSIS — C787 Secondary malignant neoplasm of liver and intrahepatic bile duct: Secondary | ICD-10-CM | POA: Diagnosis not present

## 2020-09-14 DIAGNOSIS — C7800 Secondary malignant neoplasm of unspecified lung: Secondary | ICD-10-CM | POA: Diagnosis not present

## 2020-09-16 DIAGNOSIS — Z853 Personal history of malignant neoplasm of breast: Secondary | ICD-10-CM | POA: Diagnosis not present

## 2020-09-16 DIAGNOSIS — Z9011 Acquired absence of right breast and nipple: Secondary | ICD-10-CM | POA: Diagnosis not present

## 2020-09-16 DIAGNOSIS — J90 Pleural effusion, not elsewhere classified: Secondary | ICD-10-CM | POA: Diagnosis not present

## 2020-09-16 DIAGNOSIS — C7931 Secondary malignant neoplasm of brain: Secondary | ICD-10-CM | POA: Diagnosis not present

## 2020-09-16 DIAGNOSIS — C7800 Secondary malignant neoplasm of unspecified lung: Secondary | ICD-10-CM | POA: Diagnosis not present

## 2020-09-16 DIAGNOSIS — C787 Secondary malignant neoplasm of liver and intrahepatic bile duct: Secondary | ICD-10-CM | POA: Diagnosis not present

## 2020-09-17 DIAGNOSIS — C7800 Secondary malignant neoplasm of unspecified lung: Secondary | ICD-10-CM | POA: Diagnosis not present

## 2020-09-17 DIAGNOSIS — Z853 Personal history of malignant neoplasm of breast: Secondary | ICD-10-CM | POA: Diagnosis not present

## 2020-09-17 DIAGNOSIS — C787 Secondary malignant neoplasm of liver and intrahepatic bile duct: Secondary | ICD-10-CM | POA: Diagnosis not present

## 2020-09-17 DIAGNOSIS — C7931 Secondary malignant neoplasm of brain: Secondary | ICD-10-CM | POA: Diagnosis not present

## 2020-09-17 DIAGNOSIS — Z9011 Acquired absence of right breast and nipple: Secondary | ICD-10-CM | POA: Diagnosis not present

## 2020-09-17 DIAGNOSIS — J90 Pleural effusion, not elsewhere classified: Secondary | ICD-10-CM | POA: Diagnosis not present

## 2020-09-18 DIAGNOSIS — C787 Secondary malignant neoplasm of liver and intrahepatic bile duct: Secondary | ICD-10-CM | POA: Diagnosis not present

## 2020-09-18 DIAGNOSIS — Z853 Personal history of malignant neoplasm of breast: Secondary | ICD-10-CM | POA: Diagnosis not present

## 2020-09-18 DIAGNOSIS — C7931 Secondary malignant neoplasm of brain: Secondary | ICD-10-CM | POA: Diagnosis not present

## 2020-09-18 DIAGNOSIS — J90 Pleural effusion, not elsewhere classified: Secondary | ICD-10-CM | POA: Diagnosis not present

## 2020-09-18 DIAGNOSIS — Z9011 Acquired absence of right breast and nipple: Secondary | ICD-10-CM | POA: Diagnosis not present

## 2020-09-18 DIAGNOSIS — I110 Hypertensive heart disease with heart failure: Secondary | ICD-10-CM | POA: Diagnosis not present

## 2020-09-18 DIAGNOSIS — I447 Left bundle-branch block, unspecified: Secondary | ICD-10-CM | POA: Diagnosis not present

## 2020-09-18 DIAGNOSIS — Z8701 Personal history of pneumonia (recurrent): Secondary | ICD-10-CM | POA: Diagnosis not present

## 2020-09-18 DIAGNOSIS — I480 Paroxysmal atrial fibrillation: Secondary | ICD-10-CM | POA: Diagnosis not present

## 2020-09-18 DIAGNOSIS — I5032 Chronic diastolic (congestive) heart failure: Secondary | ICD-10-CM | POA: Diagnosis not present

## 2020-09-18 DIAGNOSIS — Z8571 Personal history of Hodgkin lymphoma: Secondary | ICD-10-CM | POA: Diagnosis not present

## 2020-09-18 DIAGNOSIS — C7800 Secondary malignant neoplasm of unspecified lung: Secondary | ICD-10-CM | POA: Diagnosis not present

## 2020-09-18 DIAGNOSIS — I428 Other cardiomyopathies: Secondary | ICD-10-CM | POA: Diagnosis not present

## 2020-09-18 DIAGNOSIS — E039 Hypothyroidism, unspecified: Secondary | ICD-10-CM | POA: Diagnosis not present

## 2020-09-21 DIAGNOSIS — J90 Pleural effusion, not elsewhere classified: Secondary | ICD-10-CM | POA: Diagnosis not present

## 2020-09-21 DIAGNOSIS — C787 Secondary malignant neoplasm of liver and intrahepatic bile duct: Secondary | ICD-10-CM | POA: Diagnosis not present

## 2020-09-21 DIAGNOSIS — Z853 Personal history of malignant neoplasm of breast: Secondary | ICD-10-CM | POA: Diagnosis not present

## 2020-09-21 DIAGNOSIS — C7931 Secondary malignant neoplasm of brain: Secondary | ICD-10-CM | POA: Diagnosis not present

## 2020-09-21 DIAGNOSIS — Z9011 Acquired absence of right breast and nipple: Secondary | ICD-10-CM | POA: Diagnosis not present

## 2020-09-21 DIAGNOSIS — C7800 Secondary malignant neoplasm of unspecified lung: Secondary | ICD-10-CM | POA: Diagnosis not present

## 2020-09-23 ENCOUNTER — Ambulatory Visit: Payer: BLUE CROSS/BLUE SHIELD | Admitting: Neurology

## 2020-09-23 DIAGNOSIS — C7800 Secondary malignant neoplasm of unspecified lung: Secondary | ICD-10-CM | POA: Diagnosis not present

## 2020-09-23 DIAGNOSIS — C787 Secondary malignant neoplasm of liver and intrahepatic bile duct: Secondary | ICD-10-CM | POA: Diagnosis not present

## 2020-09-23 DIAGNOSIS — C7931 Secondary malignant neoplasm of brain: Secondary | ICD-10-CM | POA: Diagnosis not present

## 2020-09-23 DIAGNOSIS — Z9011 Acquired absence of right breast and nipple: Secondary | ICD-10-CM | POA: Diagnosis not present

## 2020-09-23 DIAGNOSIS — Z853 Personal history of malignant neoplasm of breast: Secondary | ICD-10-CM | POA: Diagnosis not present

## 2020-09-23 DIAGNOSIS — J90 Pleural effusion, not elsewhere classified: Secondary | ICD-10-CM | POA: Diagnosis not present

## 2020-09-24 ENCOUNTER — Ambulatory Visit: Payer: Medicare Other | Admitting: Cardiovascular Disease

## 2020-09-24 DIAGNOSIS — C7931 Secondary malignant neoplasm of brain: Secondary | ICD-10-CM | POA: Diagnosis not present

## 2020-09-24 DIAGNOSIS — Z9011 Acquired absence of right breast and nipple: Secondary | ICD-10-CM | POA: Diagnosis not present

## 2020-09-24 DIAGNOSIS — Z853 Personal history of malignant neoplasm of breast: Secondary | ICD-10-CM | POA: Diagnosis not present

## 2020-09-24 DIAGNOSIS — J90 Pleural effusion, not elsewhere classified: Secondary | ICD-10-CM | POA: Diagnosis not present

## 2020-09-24 DIAGNOSIS — C7800 Secondary malignant neoplasm of unspecified lung: Secondary | ICD-10-CM | POA: Diagnosis not present

## 2020-09-24 DIAGNOSIS — C787 Secondary malignant neoplasm of liver and intrahepatic bile duct: Secondary | ICD-10-CM | POA: Diagnosis not present

## 2020-10-14 ENCOUNTER — Ambulatory Visit: Payer: Medicare Other | Admitting: Hematology and Oncology

## 2020-10-19 DEATH — deceased

## 2020-12-01 ENCOUNTER — Ambulatory Visit: Payer: Medicare Other | Admitting: Cardiology
